# Patient Record
Sex: Male | Born: 1950 | Race: White | Hispanic: No | Marital: Married | Smoking: Former smoker
Health system: Southern US, Community
[De-identification: ages and names within clinical notes are randomized; demographics above are authoritative.]

## PROBLEM LIST (undated history)

## (undated) DIAGNOSIS — Z950 Presence of cardiac pacemaker: Secondary | ICD-10-CM

## (undated) DIAGNOSIS — J449 Chronic obstructive pulmonary disease, unspecified: Secondary | ICD-10-CM

## (undated) DIAGNOSIS — M869 Osteomyelitis, unspecified: Secondary | ICD-10-CM

## (undated) DIAGNOSIS — I509 Heart failure, unspecified: Secondary | ICD-10-CM

## (undated) DIAGNOSIS — I442 Atrioventricular block, complete: Secondary | ICD-10-CM

## (undated) DIAGNOSIS — I5030 Unspecified diastolic (congestive) heart failure: Secondary | ICD-10-CM

## (undated) DIAGNOSIS — I2510 Atherosclerotic heart disease of native coronary artery without angina pectoris: Secondary | ICD-10-CM

## (undated) DIAGNOSIS — I10 Essential (primary) hypertension: Secondary | ICD-10-CM

## (undated) HISTORY — DX: Essential (primary) hypertension: I10

## (undated) HISTORY — PX: CARDIAC CATHETERIZATION: SHX172

## (undated) HISTORY — DX: Osteomyelitis, unspecified: M86.9

## (undated) HISTORY — PX: BELOW KNEE LEG AMPUTATION: SUR23

## (undated) HISTORY — DX: Atherosclerotic heart disease of native coronary artery without angina pectoris: I25.10

## (undated) HISTORY — PX: CORONARY ANGIOPLASTY: SHX604

---

## 2000-01-22 DIAGNOSIS — I2510 Atherosclerotic heart disease of native coronary artery without angina pectoris: Secondary | ICD-10-CM | POA: Insufficient documentation

## 2000-09-19 ENCOUNTER — Inpatient Hospital Stay (HOSPITAL_COMMUNITY): Admission: AD | Admit: 2000-09-19 | Discharge: 2000-09-22 | Payer: Self-pay | Admitting: Cardiology

## 2010-03-08 ENCOUNTER — Encounter (HOSPITAL_BASED_OUTPATIENT_CLINIC_OR_DEPARTMENT_OTHER): Admission: RE | Admit: 2010-03-08 | Discharge: 2010-05-13 | Payer: Self-pay | Admitting: General Surgery

## 2010-03-14 ENCOUNTER — Ambulatory Visit (HOSPITAL_COMMUNITY): Admission: RE | Admit: 2010-03-14 | Discharge: 2010-03-14 | Payer: Self-pay | Admitting: General Surgery

## 2010-03-17 ENCOUNTER — Ambulatory Visit (HOSPITAL_COMMUNITY): Admission: RE | Admit: 2010-03-17 | Discharge: 2010-03-17 | Payer: Self-pay | Admitting: General Surgery

## 2010-03-30 ENCOUNTER — Encounter (HOSPITAL_COMMUNITY): Admission: RE | Admit: 2010-03-30 | Discharge: 2010-06-28 | Payer: Self-pay | Admitting: General Surgery

## 2010-03-30 ENCOUNTER — Ambulatory Visit (HOSPITAL_COMMUNITY): Admission: RE | Admit: 2010-03-30 | Discharge: 2010-03-30 | Payer: Self-pay | Admitting: Internal Medicine

## 2010-05-16 ENCOUNTER — Encounter (HOSPITAL_BASED_OUTPATIENT_CLINIC_OR_DEPARTMENT_OTHER): Admission: RE | Admit: 2010-05-16 | Discharge: 2010-05-31 | Payer: Self-pay | Admitting: General Surgery

## 2010-06-01 ENCOUNTER — Encounter (HOSPITAL_BASED_OUTPATIENT_CLINIC_OR_DEPARTMENT_OTHER): Admission: RE | Admit: 2010-06-01 | Discharge: 2010-08-30 | Payer: Self-pay | Admitting: Internal Medicine

## 2010-09-01 ENCOUNTER — Encounter (HOSPITAL_BASED_OUTPATIENT_CLINIC_OR_DEPARTMENT_OTHER)
Admission: RE | Admit: 2010-09-01 | Discharge: 2010-11-29 | Payer: Self-pay | Source: Home / Self Care | Attending: Internal Medicine | Admitting: Internal Medicine

## 2010-11-17 ENCOUNTER — Other Ambulatory Visit (HOSPITAL_BASED_OUTPATIENT_CLINIC_OR_DEPARTMENT_OTHER): Payer: Self-pay | Admitting: Internal Medicine

## 2010-12-01 ENCOUNTER — Encounter (HOSPITAL_BASED_OUTPATIENT_CLINIC_OR_DEPARTMENT_OTHER): Payer: BC Managed Care – PPO | Attending: Internal Medicine

## 2010-12-01 DIAGNOSIS — X58XXXA Exposure to other specified factors, initial encounter: Secondary | ICD-10-CM | POA: Insufficient documentation

## 2010-12-01 DIAGNOSIS — S91309A Unspecified open wound, unspecified foot, initial encounter: Secondary | ICD-10-CM | POA: Insufficient documentation

## 2010-12-29 ENCOUNTER — Encounter (HOSPITAL_BASED_OUTPATIENT_CLINIC_OR_DEPARTMENT_OTHER): Payer: BC Managed Care – PPO | Attending: Internal Medicine

## 2010-12-29 DIAGNOSIS — S91309A Unspecified open wound, unspecified foot, initial encounter: Secondary | ICD-10-CM | POA: Insufficient documentation

## 2010-12-29 DIAGNOSIS — X58XXXA Exposure to other specified factors, initial encounter: Secondary | ICD-10-CM | POA: Insufficient documentation

## 2011-01-15 LAB — CREATININE, SERUM
Creatinine, Ser: 1.2 mg/dL (ref 0.4–1.5)
Creatinine, Ser: 1.23 mg/dL (ref 0.4–1.5)
Creatinine, Ser: 0.99 mg/dL (ref 0.4–1.5)
Creatinine, Ser: 1.05 mg/dL (ref 0.4–1.5)
Creatinine, Ser: 1.05 mg/dL (ref 0.4–1.5)
Creatinine, Ser: 1.25 mg/dL (ref 0.4–1.5)
Creatinine, Ser: 1.41 mg/dL (ref 0.4–1.5)
GFR calc Af Amer: >60 mL/min (ref >60)
GFR calc Af Amer: >60 mL/min (ref >60)
GFR calc Af Amer: >60 mL/min (ref >60)
GFR calc Af Amer: >60 mL/min (ref >60)
GFR calc Af Amer: >60 mL/min (ref >60)
GFR calc Af Amer: >60 mL/min (ref >60)
GFR calc Af Amer: >60 mL/min (ref >60)
GFR calc non Af Amer: >60 mL/min (ref >60)
GFR calc non Af Amer: >60 mL/min (ref >60)
GFR calc non Af Amer: 52 mL/min — ABNORMAL LOW (ref >60)
GFR calc non Af Amer: 60 mL/min — ABNORMAL LOW (ref >60)
GFR calc non Af Amer: >60 mL/min (ref >60)
GFR calc non Af Amer: >60 mL/min (ref >60)
GFR calc non Af Amer: >60 mL/min (ref >60)

## 2011-01-15 LAB — TOBRAMYCIN LEVEL, TROUGH
Tobramycin Tr: <0.5 ug/mL — ABNORMAL LOW (ref 0.5–2.0)
Tobramycin Tr: 1.5 ug/mL (ref 0.5–2.0)
Tobramycin Tr: <0.5 ug/mL — ABNORMAL LOW (ref 0.5–2.0)

## 2011-01-15 LAB — TOBRAMYCIN LEVEL, RANDOM
Tobramycin Rm: 0.6 ug/mL
Tobramycin Rm: 0.7 ug/mL

## 2011-01-16 LAB — TOBRAMYCIN LEVEL, RANDOM
Tobramycin Rm: 0.6 ug/mL
Tobramycin Rm: 0.7 ug/mL

## 2011-01-16 LAB — CREATININE, SERUM
Creatinine, Ser: 0.79 mg/dL (ref 0.4–1.5)
Creatinine, Ser: 0.85 mg/dL (ref 0.4–1.5)
Creatinine, Ser: 0.88 mg/dL (ref 0.4–1.5)
Creatinine, Ser: 1 mg/dL (ref 0.4–1.5)
GFR calc Af Amer: >60 mL/min (ref >60)
GFR calc Af Amer: >60 mL/min (ref >60)
GFR calc Af Amer: >60 mL/min (ref >60)
GFR calc Af Amer: >60 mL/min (ref >60)
GFR calc non Af Amer: >60 mL/min (ref >60)
GFR calc non Af Amer: >60 mL/min (ref >60)
GFR calc non Af Amer: >60 mL/min (ref >60)
GFR calc non Af Amer: >60 mL/min (ref >60)

## 2011-02-02 ENCOUNTER — Emergency Department (HOSPITAL_COMMUNITY): Payer: BC Managed Care – PPO

## 2011-02-02 ENCOUNTER — Encounter (HOSPITAL_BASED_OUTPATIENT_CLINIC_OR_DEPARTMENT_OTHER): Payer: BC Managed Care – PPO | Attending: Internal Medicine

## 2011-02-02 ENCOUNTER — Inpatient Hospital Stay (HOSPITAL_COMMUNITY)
Admission: EM | Admit: 2011-02-02 | Discharge: 2011-02-10 | DRG: 225 | Disposition: A | Discharge status: Home Health | Payer: BC Managed Care – PPO | Attending: Internal Medicine | Admitting: Internal Medicine

## 2011-02-02 DIAGNOSIS — L988 Other specified disorders of the skin and subcutaneous tissue: Secondary | ICD-10-CM | POA: Diagnosis present

## 2011-02-02 DIAGNOSIS — L97509 Non-pressure chronic ulcer of other part of unspecified foot with unspecified severity: Secondary | ICD-10-CM | POA: Diagnosis present

## 2011-02-02 DIAGNOSIS — E46 Unspecified protein-calorie malnutrition: Secondary | ICD-10-CM | POA: Diagnosis present

## 2011-02-02 DIAGNOSIS — L02619 Cutaneous abscess of unspecified foot: Secondary | ICD-10-CM | POA: Diagnosis present

## 2011-02-02 DIAGNOSIS — Z880 Allergy status to penicillin: Secondary | ICD-10-CM

## 2011-02-02 DIAGNOSIS — X58XXXA Exposure to other specified factors, initial encounter: Secondary | ICD-10-CM | POA: Insufficient documentation

## 2011-02-02 DIAGNOSIS — S91309A Unspecified open wound, unspecified foot, initial encounter: Secondary | ICD-10-CM | POA: Insufficient documentation

## 2011-02-02 DIAGNOSIS — L03119 Cellulitis of unspecified part of limb: Secondary | ICD-10-CM | POA: Diagnosis present

## 2011-02-02 DIAGNOSIS — I2510 Atherosclerotic heart disease of native coronary artery without angina pectoris: Secondary | ICD-10-CM | POA: Diagnosis present

## 2011-02-02 DIAGNOSIS — M869 Osteomyelitis, unspecified: Principal | ICD-10-CM | POA: Diagnosis present

## 2011-02-02 DIAGNOSIS — D649 Anemia, unspecified: Secondary | ICD-10-CM | POA: Diagnosis present

## 2011-02-02 DIAGNOSIS — G5790 Unspecified mononeuropathy of unspecified lower limb: Secondary | ICD-10-CM | POA: Diagnosis present

## 2011-02-02 DIAGNOSIS — Z7982 Long term (current) use of aspirin: Secondary | ICD-10-CM

## 2011-02-02 DIAGNOSIS — D72829 Elevated white blood cell count, unspecified: Secondary | ICD-10-CM | POA: Diagnosis present

## 2011-02-02 LAB — COMPREHENSIVE METABOLIC PANEL
ALT: 31 U/L (ref 0–53)
AST: 40 U/L — ABNORMAL HIGH (ref 0–37)
Albumin: 1.9 g/dL — ABNORMAL LOW (ref 3.5–5.2)
Alkaline Phosphatase: 79 U/L (ref 39–117)
BUN: 12 mg/dL (ref 6–23)
CO2: 25 mEq/L (ref 19–32)
Calcium: 8.2 mg/dL — ABNORMAL LOW (ref 8.4–10.5)
Chloride: 92 mEq/L — ABNORMAL LOW (ref 96–112)
Creatinine, Ser: 0.88 mg/dL (ref 0.4–1.5)
GFR calc Af Amer: >60 mL/min (ref >60)
GFR calc non Af Amer: >60 mL/min (ref >60)
Glucose, Bld: 104 mg/dL — ABNORMAL HIGH (ref 70–99)
Potassium: 3 mEq/L — ABNORMAL LOW (ref 3.5–5.1)
Sodium: 128 mEq/L — ABNORMAL LOW (ref 135–145)
Total Bilirubin: 0.8 mg/dL (ref 0.3–1.2)
Total Protein: 6.5 g/dL (ref 6.0–8.3)

## 2011-02-02 LAB — DIFFERENTIAL
Basophils Absolute: 0 10*3/uL (ref 0.0–0.1)
Basophils Relative: 0 % (ref 0–1)
Eosinophils Absolute: 0 10*3/uL (ref 0.0–0.7)
Eosinophils Relative: 0 % (ref 0–5)
Lymphocytes Relative: 6 % — ABNORMAL LOW (ref 12–46)
Lymphs Abs: 1.1 10*3/uL (ref 0.7–4.0)
Monocytes Absolute: 1.3 10*3/uL — ABNORMAL HIGH (ref 0.1–1.0)
Monocytes Relative: 7 % (ref 3–12)
Neutro Abs: 15.5 10*3/uL — ABNORMAL HIGH (ref 1.7–7.7)
Neutrophils Relative %: 87 % — ABNORMAL HIGH (ref 43–77)

## 2011-02-02 LAB — CBC
HCT: 37.6 % — ABNORMAL LOW (ref 39.0–52.0)
Hemoglobin: 12.9 g/dL — ABNORMAL LOW (ref 13.0–17.0)
MCH: 30 pg (ref 26.0–34.0)
MCHC: 34.3 g/dL (ref 30.0–36.0)
MCV: 87.4 fL (ref 78.0–100.0)
Platelets: 359 10*3/uL (ref 150–400)
RBC: 4.3 MIL/uL (ref 4.22–5.81)
RDW: 13.5 % (ref 11.5–15.5)
WBC: 17.9 10*3/uL — ABNORMAL HIGH (ref 4.0–10.5)

## 2011-02-03 ENCOUNTER — Inpatient Hospital Stay (HOSPITAL_COMMUNITY): Payer: BC Managed Care – PPO

## 2011-02-03 DIAGNOSIS — L97509 Non-pressure chronic ulcer of other part of unspecified foot with unspecified severity: Secondary | ICD-10-CM

## 2011-02-03 LAB — BASIC METABOLIC PANEL
BUN: 14 mg/dL (ref 6–23)
CO2: 28 mEq/L (ref 19–32)
Calcium: 7.8 mg/dL — ABNORMAL LOW (ref 8.4–10.5)
Chloride: 102 mEq/L (ref 96–112)
Creatinine, Ser: 0.79 mg/dL (ref 0.4–1.5)
GFR calc Af Amer: >60 mL/min (ref >60)
GFR calc non Af Amer: >60 mL/min (ref >60)
Glucose, Bld: 104 mg/dL — ABNORMAL HIGH (ref 70–99)
Potassium: 3.9 mEq/L (ref 3.5–5.1)
Sodium: 135 mEq/L (ref 135–145)

## 2011-02-03 LAB — CBC
HCT: 36.1 % — ABNORMAL LOW (ref 39.0–52.0)
Hemoglobin: 11.9 g/dL — ABNORMAL LOW (ref 13.0–17.0)
MCH: 29.8 pg (ref 26.0–34.0)
MCHC: 33 g/dL (ref 30.0–36.0)
MCV: 90.3 fL (ref 78.0–100.0)
Platelets: 356 10*3/uL (ref 150–400)
RBC: 4 MIL/uL — ABNORMAL LOW (ref 4.22–5.81)
RDW: 13.8 % (ref 11.5–15.5)
WBC: 16.2 10*3/uL — ABNORMAL HIGH (ref 4.0–10.5)

## 2011-02-03 LAB — VANCOMYCIN, TROUGH: Vancomycin Tr: 21.5 ug/mL — ABNORMAL HIGH (ref 10.0–20.0)

## 2011-02-03 LAB — MAGNESIUM: Magnesium: 2.2 mg/dL (ref 1.5–2.5)

## 2011-02-03 LAB — TSH: TSH: 3.209 u[IU]/mL (ref 0.350–4.500)

## 2011-02-03 LAB — SEDIMENTATION RATE: Sed Rate: 64 mm/hr — ABNORMAL HIGH (ref 0–16)

## 2011-02-03 MED ORDER — GADOBENATE DIMEGLUMINE 529 MG/ML IV SOLN
20.0000 mL | Freq: Once | INTRAVENOUS | Status: AC | PRN
  Administered 2011-02-03: 20 mL via INTRAVENOUS

## 2011-02-04 LAB — HEPATIC FUNCTION PANEL
ALT: 21 U/L (ref 0–53)
AST: 24 U/L (ref 0–37)
Albumin: 1.5 g/dL — ABNORMAL LOW (ref 3.5–5.2)
Alkaline Phosphatase: 64 U/L (ref 39–117)
Bilirubin, Direct: 0.3 mg/dL (ref 0.0–0.3)
Indirect Bilirubin: 0.5 mg/dL (ref 0.3–0.9)
Total Bilirubin: 0.8 mg/dL (ref 0.3–1.2)
Total Protein: 5.1 g/dL — ABNORMAL LOW (ref 6.0–8.3)

## 2011-02-04 LAB — C-REACTIVE PROTEIN: CRP: 22.4 mg/dL — ABNORMAL HIGH (ref <0.6)

## 2011-02-04 LAB — CBC
HCT: 35.5 % — ABNORMAL LOW (ref 39.0–52.0)
Hemoglobin: 11.4 g/dL — ABNORMAL LOW (ref 13.0–17.0)
MCH: 28.9 pg (ref 26.0–34.0)
MCHC: 32.1 g/dL (ref 30.0–36.0)
MCV: 89.9 fL (ref 78.0–100.0)
Platelets: 345 10*3/uL (ref 150–400)
RBC: 3.95 MIL/uL — ABNORMAL LOW (ref 4.22–5.81)
RDW: 13.8 % (ref 11.5–15.5)
WBC: 14.8 10*3/uL — ABNORMAL HIGH (ref 4.0–10.5)

## 2011-02-04 LAB — PREALBUMIN: Prealbumin: 1.8 mg/dL — ABNORMAL LOW (ref 17.0–34.0)

## 2011-02-04 LAB — HIV ANTIBODY (ROUTINE TESTING W REFLEX): HIV: NONREACTIVE

## 2011-02-05 DIAGNOSIS — L97509 Non-pressure chronic ulcer of other part of unspecified foot with unspecified severity: Secondary | ICD-10-CM

## 2011-02-05 LAB — VANCOMYCIN, TROUGH: Vancomycin Tr: 19.1 ug/mL (ref 10.0–20.0)

## 2011-02-06 ENCOUNTER — Other Ambulatory Visit: Payer: Self-pay | Admitting: Specialist

## 2011-02-06 LAB — WOUND CULTURE

## 2011-02-06 LAB — BASIC METABOLIC PANEL
BUN: 5 mg/dL — ABNORMAL LOW (ref 6–23)
CO2: 28 mEq/L (ref 19–32)
Calcium: 8 mg/dL — ABNORMAL LOW (ref 8.4–10.5)
Chloride: 101 mEq/L (ref 96–112)
Creatinine, Ser: 0.77 mg/dL (ref 0.4–1.5)
GFR calc Af Amer: >60 mL/min (ref >60)
GFR calc non Af Amer: >60 mL/min (ref >60)
Glucose, Bld: 90 mg/dL (ref 70–99)
Potassium: 3.8 mEq/L (ref 3.5–5.1)
Sodium: 134 mEq/L — ABNORMAL LOW (ref 135–145)

## 2011-02-06 LAB — CBC
HCT: 36.2 % — ABNORMAL LOW (ref 39.0–52.0)
Hemoglobin: 11.5 g/dL — ABNORMAL LOW (ref 13.0–17.0)
MCH: 28.8 pg (ref 26.0–34.0)
MCHC: 31.8 g/dL (ref 30.0–36.0)
MCV: 90.5 fL (ref 78.0–100.0)
Platelets: 277 10*3/uL (ref 150–400)
RBC: 4 MIL/uL — ABNORMAL LOW (ref 4.22–5.81)
RDW: 13.8 % (ref 11.5–15.5)
WBC: 11.7 10*3/uL — ABNORMAL HIGH (ref 4.0–10.5)

## 2011-02-07 ENCOUNTER — Inpatient Hospital Stay (HOSPITAL_COMMUNITY): Payer: BC Managed Care – PPO

## 2011-02-07 DIAGNOSIS — L97409 Non-pressure chronic ulcer of unspecified heel and midfoot with unspecified severity: Secondary | ICD-10-CM

## 2011-02-07 LAB — CBC
HCT: 35.8 % — ABNORMAL LOW (ref 39.0–52.0)
Hemoglobin: 11.1 g/dL — ABNORMAL LOW (ref 13.0–17.0)
MCH: 28.5 pg (ref 26.0–34.0)
MCHC: 31 g/dL (ref 30.0–36.0)
MCV: 92 fL (ref 78.0–100.0)
Platelets: 177 10*3/uL (ref 150–400)
RBC: 3.89 MIL/uL — ABNORMAL LOW (ref 4.22–5.81)
RDW: 14.2 % (ref 11.5–15.5)
WBC: 9.4 10*3/uL (ref 4.0–10.5)

## 2011-02-08 DIAGNOSIS — L97909 Non-pressure chronic ulcer of unspecified part of unspecified lower leg with unspecified severity: Secondary | ICD-10-CM

## 2011-02-08 LAB — BASIC METABOLIC PANEL
BUN: 6 mg/dL (ref 6–23)
CO2: 31 mEq/L (ref 19–32)
Calcium: 8.1 mg/dL — ABNORMAL LOW (ref 8.4–10.5)
Chloride: 100 mEq/L (ref 96–112)
Creatinine, Ser: 0.8 mg/dL (ref 0.4–1.5)
GFR calc Af Amer: >60 mL/min (ref >60)
GFR calc non Af Amer: >60 mL/min (ref >60)
Glucose, Bld: 119 mg/dL — ABNORMAL HIGH (ref 70–99)
Potassium: 3.9 mEq/L (ref 3.5–5.1)
Sodium: 136 mEq/L (ref 135–145)

## 2011-02-09 ENCOUNTER — Inpatient Hospital Stay (HOSPITAL_COMMUNITY): Payer: BC Managed Care – PPO

## 2011-02-09 LAB — CULTURE, BLOOD (ROUTINE X 2)
Culture  Setup Time: 201204060026
Culture  Setup Time: 201204060026
Culture: NO GROWTH
Culture: NO GROWTH

## 2011-02-11 NOTE — Discharge Summary (Signed)
NAME:  Nathan Miller, Nathan Miller NO.:  [id number]  MEDICAL RECORD NO.:  [id number]           PATIENT TYPE:  I  LOCATION:  1607                         FACILITY:  [facility]  PHYSICIAN:  Hillery Aldo, M.D.   DATE OF BIRTH:  09/16/1951  DATE OF ADMISSION:  02/02/2011 DATE OF DISCHARGE:  02/09/2011                              DISCHARGE SUMMARY   PRIMARY CARE PHYSICIAN:  Maxwell Caul, M.D.  ORTHOPEDIC SURGEON:  Jene Every, M.D.  INFECTIOUS DISEASE PHYSICIAN:  Lacretia Leigh. Ninetta Lights, M.D.  DISCHARGE DIAGNOSES: 1. Right foot osteomyelitis status post debridement and resection of     the fourth and fifth metatarsals and partial resection of the third     metatarsal with partial resection of the cuboid and application of     a wound VAC. 2. 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 3. History of coronary artery disease. 4. PENICILLIN allergy. 5. Right non-diabetic neuropathic foot. 6. Mild normocytic anemia  DISCHARGE MEDICATIONS: 1. Aspirin 81 mg p.o. daily. 2. Ensure 1 can t.i.d. 3. Merrem 500 mg IV q.8 h via PICC line x5 weeks. 4. Xanax 0.5 mg p.o. b.i.d.  CONSULTATIONS: 1. Dr. Jene Every with [facility]. 2. Dr. Johny Sax with Infectious Diseases.  BRIEF ADMISSION HPI:  The patient is a 60 year old male with a past medical history of chronic nonhealing wound infection involving his right lower extremity.  On the date of admission, the patient saw his physician and was subsequently sent to the hospital due to increased swelling and erythema of his right lower extremity.  The patient subsequently was referred to the hospitalist service for inpatient management with IV antibiotics.  For full details, please see the dictated report done by Dr. Ardyth Harps.  PROCEDURES AND DIAGNOSTIC STUDIES: 1. MRI of the right foot on February 03, 2011, showed florid osteomyelitis     of the foot with medial plantar and medial midfoot soft  tissue     abscesses, septic midfoot joints, ulceration with packing material     extending to the lateral tarsometatarsal junction, severe     inflammatory changes of the ankle likely reflecting neuropathic     ankle, however, septic joint could not be excluded. 2. Bedside debridement of necrotic tissue performed on February 03, 2011. 3. Resection of the fourth and fifth metatarsals, partial resection of     third metatarsal with partial resection of the cuboid, incision and     debridement and application of wound VAC performed on February 06, 2011. 4. Chest x-ray on February 09, 2011, status post placement of a right     peripherally inserted central catheter, showed the catheter line     tip in the lower SVC.  DISCHARGE LABORATORY VALUES:  Blood cultures have remained negative. Sodium was 136, potassium 3.9, chloride 100, bicarb 31, BUN 9, creatinine 0.80, glucose 119, calcium 8.1.  White blood cell count was 9.4, hemoglobin 11.1, hematocrit 35.8, platelets 177.  Wound cultures grew multiple organisms with none predominant.  HIV antibody was nonreactive.  CRP was 22.4.  ESR was 64.  TSH was 3.209.  HOSPITAL COURSE BY PROBLEM: 1. Right foot osteomyelitis secondary to nonhealing ulcer and     surrounding cellulitis:  The patient was admitted to the hospital     and put on broad spectrum antibiotics.  Wound cultures and blood     cultures were obtained.  Both Orthopedics and Infectious Disease     services were consulted and a bedside debridement was initially     performed.  The MRI confirmed florid osteomyelitis and the patient     ultimately went to surgery as described above.  Infectious Disease     recommended treatment with Primaxin due to his penicillin allergy.     Cultures ultimately showed polymicrobial growth and recommendations     from Dr. Ninetta Lights were for a full 6-week course of IV antibiotics     which can be changed to meropenem for ease of administration at     discharge.   The patient will be discharged on meropenem 500 mg IV     q.8 h via a PICC line that was inserted for this purpose.  A wound     VAC was also placed and will continue to be monitored by the home     health nurses for wound healing.  The patient should follow up with     the orthopedic surgeon as directed. 2. History of coronary artery disease:  The patient was put on aspirin     therapy for his known history of coronary artery disease.  He was     not on this prior to admission but was recommended to continue     taking this at discharge.  He has no complaints of chest pain. 3. Nondiabetic neuropathic right foot.  This was secondary to a     history of remote trauma.  He will need to have close followup. 4. Mild normocytic anemia:  The patient does have a mild normocytic     anemia and we do recommend further outpatient evaluation by the     patient's primary care physician if deemed necessary.  He has not     had a GI evaluation, could consider this as an outpatient.  DISPOSITION:  The patient is medically stable and will be discharged home on IV antibiotic therapy times additional 5 weeks.  ACTIVITY:  As tolerated.  DIET:  Heart healthy  CONDITION ON DISCHARGE:  Stable.  Time spent coordinating care for discharge and discharge instructions including face-to-face time equals approximately 35 minutes.     Hillery Aldo, M.D.     CR/MEDQ  D:  02/09/2011  T:  02/09/2011  Job:  [id number]  cc:   Maxwell Caul, M.D.  Jene Every, M.D. Fax: [phone]  Lacretia Leigh. Ninetta Lights, M.D. Fax: [phone]  Electronically Signed by Hillery Aldo M.D. on 02/11/2011 07:15:48 AM

## 2011-02-15 NOTE — Op Note (Signed)
NAME:  Nathan Miller, Nathan Miller NO.:  [id number]  MEDICAL RECORD NO.:  [id number]           PATIENT TYPE:  LOCATION:                                 FACILITY:  PHYSICIAN:  Jene Every, M.D.    DATE OF BIRTH:  Jan 26, 1951  DATE OF PROCEDURE: DATE OF DISCHARGE:                              OPERATIVE REPORT   PREOPERATIVE DIAGNOSIS:  Necrotic nonhealing neuropathic ulcer over the right foot with osteomyelitis of the fifth metatarsal.  POSTOPERATIVE DIAGNOSIS:  Necrotic nonhealing neuropathic ulcer over the right foot with osteomyelitis of the fifth metatarsal.  PROCEDURE PERFORMED: 1. Irrigation and debridement of nonhealing ulcer. 2. Partial excision of the fifth metatarsal base. 3. I and D of wound including bone with the wound measuring 10 cm x 6     cm.  ANESTHESIA:  None.  The patient had neuropathic foot.  BRIEF HISTORY:  A 60 year old with nonhealing ulcer, neuropathic, admitted for infection and cellulitis.  He sustained trauma in the past, there was an open wound with foul-smelling odor.  He was admitted on the medicine service.  Consult for ID is indicated for debridement, did at the bedside as the patient had just eaten food.  TECHNIQUE:  The patient was in supine position.  The right foot was prepped and draped in the usual sterile fashion.  I sharply debrided the wound edges, which were necrotic, to good bleeding tissue.  I debrided deep down to the fifth metatarsal, which was totally exposed.  It appeared to be nonviable, removed a portion of the fifth metatarsal base with a Kelly and spread just beneath the fifth metatarsal and the deep compartments of the superficial compartments as well as proximally to just beneath the fibula and distally just to rule out any subcutaneous abscess.  There was no gross purulence there, though there was some tissue that I did culture for aerobic, anaerobic, and Gram stain.  I debride the wound edges.  I then  packed the wound open and secured it without compression dressing.  The patient tolerated procedure well.  No complication.  Blood loss was 10 cc.  The patient will require a formal further revision in the operating room to resect the majority of the fifth metatarsal and then consider with further debridement and then consider definitive reconstructive procedure.     Jene Every, M.D.     Cordelia Pen  D:  02/03/2011  T:  02/04/2011  Job:  [id number]  Electronically Signed by Jene Every M.D. on 02/15/2011 12:12:16 PM

## 2011-02-15 NOTE — Consult Note (Signed)
NAME:  Nathan Miller, Nathan Miller NO.:  [id number]  MEDICAL RECORD NO.:  [id number]           PATIENT TYPE:  I  LOCATION:  1607                         FACILITY:  [facility]  PHYSICIAN:  Jene Every, M.D.    DATE OF BIRTH:  1950-11-16  DATE OF CONSULTATION:  02/03/2011 DATE OF DISCHARGE:                                CONSULTATION   CHIEF COMPLAINT:  Nonhealing necrotic wound, right foot.  HISTORY:  This is a 60 year old male who has had chronic nonhealing ulcer of the right foot, treated by [facility] Leanord [facility]. It has been multiple years of duration, originally stemming from a fracture sustained in his leg.  He was recently seen by Dr. Leanord Hawking and noted to have increased swelling and redness of his leg, sent through the emergency room, given IV antibiotics, and was admitted on February 02, 2011.  Given the possibility of osteomyelitis of the foot, needs MRI.  The patient reported recent fever but no chills, unexplained recentweight loss, and continues to work at a [employer].  PAST MEDICAL HISTORY:  Coronary artery disease, status post stent.  ALLERGIES:  PENICILLIN - hives and shortness of breath.  MEDICATIONS:  Xanax and currently on IV antibiotics.  FAMILY MEDICAL HISTORY:  Hypertension.  SOCIAL HISTORY:  Two to three beers per day, tobacco 2 to 3 cigarettes per day.  Lives on his own.  PHYSICAL EXAMINATION:  GENERAL:  Healthy, no distress, mood and affect appropriate. VITAL SIGNS:  Currently, afebrile, pulse 75, blood pressure is normal. HEENT:  Within normal limits. COR:  Regular rate and rhythm. LUNGS:  Clear to auscultation. ABDOMEN:  Soft, nontender. PELVIS:  Stable. EXTREMITIES:  Right leg, he does have some cellulitis up into the mid calf.  Knee exam is unremarkable as is the thigh.  His compartments are soft.  There is no fluctuance.  Removed his dressing, he has a large necrotic wound extending from the distal lateral aspect of the 5th  toe to the proximal 5th toe.  It is necrotic, foul smelling, appears to be dry.  There is no purulence from the wound itself but necrotic bone is seen.  Trace dorsalis pedis pulse is noted.  Contralateral leg is within normal limits.  LABORATORY DATA:  Current labs reveal white count of 15.2 which has decreased from 17.9, hemoglobin 11.9.  Creatinine is 0.79.  He does have a low albumin at 1.9 on admit.  Radiographs of the foot demonstrate soft tissue swelling in the base of the 5th metatarsal and pain suggesting possible osteomyelitis.  There are severe degenerative changes of multiple tarsal bones suggesting neuropathic arthropathy.  Cortical erosion of the base of fifth metatarsal.  He had sensation of plantar aspect of the foot which is chronic.  Culture from the wound have grown pseudomonas in the past, MRI indicating possible osteo, no abscess.  He is on vancomycin.  IMPRESSION:  Chronic necrotic nonhealing ulcer of the right foot with nonviable 5th metatarsal chronic osteomyelitis with associated pseudomonas organism, neuropathic foot.  PLAN:  The patient will receive local debridement at the bedside, he has just  eaten.  He is not septic, he will require most likely a revision amputation of the foot, will discuss this with perhaps with Dr. Lestine Box for further definitive treatment.  This, however, will debride the necrotic wound at the bedside, and then consult wound management then local debridement, ID consult, and appropriate antibiotics.     Jene Every, M.D.     Cordelia Pen  D:  02/03/2011  T:  02/04/2011  Job:  [id number]  Electronically Signed by Jene Every M.D. on 02/15/2011 12:12:10 PM

## 2011-02-15 NOTE — Op Note (Signed)
NAME:  REIF, BEDGOOD NO.:  [id number]  MEDICAL RECORD NO.:  [id number]           PATIENT TYPE:  I  LOCATION:  1607                         [facility]ACILITY:  [facility]  PHYSICIAN:  Jene Every, M.D.    DATE OF BIRTH:  1951-06-13  DATE OF PROCEDURE: DATE OF DISCHARGE:                              OPERATIVE REPORT   PREOPERATIVE DIAGNOSIS:  Osteomyelitis of the right foot, neuropathic ulcer.  POSTOPERATIVE DIAGNOSIS:  Osteomyelitis of the right foot, neuropathic ulcer.  PROCEDURES: 1. Ray resection of the fourth and fifth metatarsals. 2. Partial resection of the third metatarsal. 3. Partial resection of the cuboid. 4. Incision and debridement. 5. Application of wound VAC.  ANESTHESIA:  Spinal.  ASSISTANT:  None.  BRIEF HISTORY:  This is a 60 year old who has had history of a neuropathic foot from a nerve damage from an accident, developed an ulcer, was treated over the past year, deteriorated, got infected, exposed bone, seen in the emergency room, cellulitis and sepsis, was treated with IV antibiotics, seen by Infectious Disease.  They controlled the cellulitis, got the wound culture.  We did a provisional debridement at the bedside.  He was indicated for a formal debridement and removal of nonviable tissue.  He did have a fifth metatarsal exposing the bone that was dark and nonviable.  He was indicated for resection of devitalized bone which was infected, debridement, possible application of wound VAC.  The risks and benefits were discussed including bleeding, infection, need for further revision, need for proximal amputation, DVT, PE, anesthetic complications etc.  TECHNIQUE:  The patient in supine position.  After induction of adequate spinal anesthesia, the right lower extremity was prepped and draped in the usual sterile fashion.  First we debrided the skin edges of the large foot ulcer which expanded from the digits distally to near the calcaneus  proximally.  It extended from the dorsum of the foot to the plantar aspect of the foot.  The fifth metatarsal was engaged and had dislodged quite easily, the whole bone appeared to be nonviable.  I then debrided the metatarsophalangeal joint, debrided the capsule of that joint to good bleeding tissue.  The base surrounding the fifth was debrided as well, the  four was identified and two appeared to be nonviable and the fourth metatarsal was disarticulated, again without any difficulty and indicated nonviable tissue without attachment.  It exposed base of the third which was partially excised to bleeding tissue.  Cuboid was also noted and it was devitalized and we debrided to perform a partial cuboidectomy to bleeding tissue.  The wound edges surrounding this were debrided to bone.  Cautery was utilized to achieve hemostasis.  He actually had a good blood supply to the wound.  No pus was noted but necrotic tissue was excised.  After all necrotic tissue was excised to good bleeding bed and good bleeding tissue, had a fairly large cavity measuring 10 cm x 7 cm.  This was deep to the wound.  We felt this would require the VAC  and then we copiously irrigated with pulsatile lavage.  I debrided the joint  capsule and removed the soft tissue from the metatarsophalangeal joint of the fourth and fifth.  We debrided all edges to good clean tissue.  I selected a VAC sponge that was the size of the wound and delivered it into the wound and dried the foot and secured the [facility] and hooked it up with continued dissection with excellent suction applied and securing of the wound.  The other part of the heel, there was an abrasion with some bleeding tissue or bleeding ulcer.  We placed Xeroform on that and then covered it to the wound VAC and hooked it up to the suction.  The wound was then dressed sterilely and the patient was then transported to the recovery room in satisfactory condition.  The patient  tolerated the procedure well.  No complication.  Blood loss was 100 cc.     Jene Every, M.D.     Cordelia Pen  D:  02/06/2011  T:  02/07/2011  Job:  [id number]  Electronically Signed by Jene Every M.D. on 02/15/2011 12:12:14 PM

## 2011-02-16 NOTE — Group Therapy Note (Signed)
NAME:  Nathan Miller, Nathan Miller NO.:  [id number]  MEDICAL RECORD NO.:  [id number]           PATIENT TYPE:  I  LOCATION:  1607                         FACILITY:  [facility]  PHYSICIAN:  Clydia Llano, MD       DATE OF BIRTH:  Mar 03, 1951                                PROGRESS NOTE   REASON FOR ADMISSION: Right foot wound.  DISCHARGE DIAGNOSES: 1. Right foot acute osteomyelitis. 2. History of chronic nonhealing ulcer infected with Pseudomonas. 3. History of coronary artery disease. 4. Penicillin allergy. 5. Right nondiabetic neuropathic foot.  DISCHARGE MEDICATIONS: To be done at the time of actual discharge.  CONSULTS: 1. [facility], Dr. Jene Every. 2. Infectious Disease service, Dr. Johny Sax.  PROCEDURES: 1. April 6 bedside debridement of necrotic tissues. 2. April 9 ray resection of the fourth and fifth metatarsals, partial     resection of the third metatarsal with partial resection of the     cuboid, with incision and debridement and application of wound VAC.  BRIEF HISTORY AND EXAMINATION: Mr. Lopes is a 60 year old Caucasian male with a past medical history of right nondiabetic neuropathic foot and chronic, nonhealing right lateral foot ulcer.  The patient was following with [facility], went to Dr. Leanord Hawking because of increased swelling and redness on his right leg.  The patient was promptly sent to the emergency department for evaluation of IV antibiotics given that his wound was significantly worsened since last time of evaluation.  The redness extends up to the knee.  Mr. Matkin has really no active complaints in terms of fever or chills.  He has really no active complaints apart from fever every now and then.  He said sometimes at home he ran a temperature up to 102.  RADIOLOGY: 1. MRI of the right foot April 6 showed florid osteomyelitis of the     foot with medial plantar and medial midfoot soft tissue abscesses,     septic midfoot  joints, ulceration with packing material extending     to the lateral tarsometatarsal junction.  Severe inflammatory     changes of the ankle probably reflect neuropathic ankle, however,     septic joints cannot be excluded. 2. Foot x-ray April 10 showed postsurgical changes, as described, with     resection of the fifth metatarsal and most of the fourth     metatarsal.  There is partial resection or progressive destruction     of the base of the third metatarsal with lots of cuneiform and the     cuboid.  BRIEF HOSPITAL COURSE: 1. Right foot acute osteomyelitis/cellulitis.  As mentioned above, the     patient was following with [facility]ound Clinic and he was sent because of     worsening of the wound.  The patient's wound, when I examined it,     had a greenish tint.  It had the worst smell I have smelled in     years.  I consulted Orthopedics and Infectious Disease service.     The patient had two debridements until now, one at bedside and the  other one was major with bone resection on the 9th.  The patient is     still on Maxipime.  The patient is still on Primaxin 500 mg every 6     hours.  The culture was polymicrobial and it was awaiting further     lab for further identification of the organisms.  The patient has     wound VAC now.  The Ortho is not planning for further debridement. 2. History of coronary artery disease, stable:  The patient was not on     chronic medication for that except aspirin.  He has no complaints. 3. Nondiabetic neuropathic right foot:  This is secondary to a remote     trauma.  The patient has a neuropathy and severe right foot joints     changes secondary to that.  DISCHARGE INSTRUCTIONS: 1. Activity as tolerated. 2. Diet:  Regular. 3. Disposition:  Home with home health service for wound VAC. 4. Please verify with Infectious Diseases the final recommendation     with antibiotics.  If the patient needs to be on IV antibiotics,     the patient should  get PICC line.     Clydia Llano, MD     ME/MEDQ  D:  02/08/2011  T:  02/08/2011  Job:  [id number]  Electronically Signed by Clydia Llano  on 02/16/2011 05:10:34 PM

## 2011-02-24 NOTE — Assessment & Plan Note (Signed)
[facility]  NAME:  Nathan Miller, Nathan Miller NO.:  [id number]  MEDICAL RECORD NO.:  [id number]      DATE OF BIRTH:  03/07/1951  PHYSICIAN:  Maxwell Caul, M.D. VISIT DATE:  02/23/2011                                  OFFICE VISIT   HISTORY OF PRESENT ILLNESS:  Nathan Miller is a gentleman I have followed for a neuropathic wound on his right foot dating back to the early parts of 2011.  He initially was diagnosed with osteomyelitis of the right fifth metatarsal.  He underwent a prolonged course of antibiotics with culture showing Pseudomonas as well as HBO.  Although the wound improved through the summer of 2011, this never really totally resolved or even got close to resolving.  The patient needed to return to work.  He was noncompliant with weightbearing and was not interested in surgical options including referrals to foot surgeons.  He came into the clinic on February 02, 2011 with a grossly infected wound on the foot with cellulitis expanding up his leg.  He was admitted to the hospital.  An MRI showed osteomyelitis involving the midfoot and metatarsal bases, the cuboid, the lateral middle and medial cuneiforms as well as the navicular bone.  There was soft tissue infection in the mid foot as well.  He was seen by Infectious Diseases.  His cultures, both blood and wound were negative.  He was ended up on meropenem 500 IV q.8 h. for a further 5 weeks (total of 6 weeks).  He is at home still up on this somewhat (went to the grocery store yesterday), however, his daughter accompanies him said he is most spending most of his time sitting in a chair with his leg propped up.  He has not had any fever or chills.  He is eating and drinking well.  PHYSICAL EXAMINATION:  VITAL SIGNS:  His temperature is 98.2, pulse 91, respirations 18, and blood pressure 122/76. SKIN:  He now has a large wound which is a surgical wound.  There is healthy granulation.  There  is an exposed bone in the middle of this.  I did do a culture of the wound base.  The foot itself is very swollen, somewhat pink in color, not grossly infected, and he is neuropathic here, so he is not experiencing any pain.  IMPRESSION: 1. Now postsurgical wound for a septic foot and multiple areas of     osteomyelitis.  He is on meropenem for this.  He is following with     Dr. Shelle Iron.  The wound itself does not look unhealthy.  I did     reculture this.  The foot itself is very swollen, however, he does     not appear to be grossly infected at this point.  He will continue the antibiotics as directed by Dr. Ninetta Lights.  We will see if our current culture shows anything different.  His compliance with staying off this I think is marginal.  I have asked [facility] who is the [facility] seeing him to train him on the use of crutches to give him a little mobility and staying off this foot.  He would be a candidate for hyperbaric oxygen.  However, logistically transporting  him here seems a bit overwhelming, although I will explore this with him and his family next week.  With regards to actual wound dressings, we have added Prisma under the wound VAC and this will continued to be changed by [facility].          ______________________________ Maxwell Caul, M.D.     MGR/MEDQ  D:  02/23/2011  T:  02/24/2011  Job:  [id number]

## 2011-02-26 ENCOUNTER — Emergency Department (HOSPITAL_COMMUNITY)
Admission: EM | Admit: 2011-02-26 | Discharge: 2011-02-26 | Disposition: A | Discharge status: Home / Self Care | Payer: BC Managed Care – PPO | Attending: Emergency Medicine | Admitting: Emergency Medicine

## 2011-02-26 ENCOUNTER — Emergency Department (HOSPITAL_COMMUNITY): Payer: BC Managed Care – PPO

## 2011-02-26 DIAGNOSIS — Y840 Cardiac catheterization as the cause of abnormal reaction of the patient, or of later complication, without mention of misadventure at the time of the procedure: Secondary | ICD-10-CM | POA: Insufficient documentation

## 2011-02-26 DIAGNOSIS — Z139 Encounter for screening, unspecified: Secondary | ICD-10-CM | POA: Insufficient documentation

## 2011-02-26 DIAGNOSIS — I252 Old myocardial infarction: Secondary | ICD-10-CM | POA: Insufficient documentation

## 2011-02-26 DIAGNOSIS — F17200 Nicotine dependence, unspecified, uncomplicated: Secondary | ICD-10-CM | POA: Insufficient documentation

## 2011-02-26 DIAGNOSIS — I10 Essential (primary) hypertension: Secondary | ICD-10-CM | POA: Insufficient documentation

## 2011-02-26 DIAGNOSIS — S98139A Complete traumatic amputation of one unspecified lesser toe, initial encounter: Secondary | ICD-10-CM | POA: Insufficient documentation

## 2011-02-26 DIAGNOSIS — T82897A Other specified complication of cardiac prosthetic devices, implants and grafts, initial encounter: Secondary | ICD-10-CM | POA: Insufficient documentation

## 2011-02-26 NOTE — H&P (Signed)
NAME:  Nathan Miller, Nathan Miller NO.:  [id number]  MEDICAL RECORD NO.:  [id number]           PATIENT TYPE:  E  LOCATION:  WLED                         FACILITY:  [facility]  PHYSICIAN:  Peggye Pitt, M.D. DATE OF BIRTH:  08/16/1951  DATE OF ADMISSION:  02/02/2011 DATE OF DISCHARGE:                             HISTORY & PHYSICAL   PRIMARY CARE PHYSICIAN:  Maxwell Caul, MD  CHIEF COMPLAINT:  Right foot wound.  HISTORY OF PRESENT ILLNESS:  Nathan Miller is a 60 year old Caucasian gentleman with past medical history significant for coronary artery disease status post RCA stent in 2001 and also a chronic nonhealing right lateral foot ulcer, who went to see his physician today, Dr. Leanord Hawking, because of increased swelling and redness of his right leg.  Dr. Leanord Hawking promptly send him to the emergency department for evaluation and IV antibiotics given the fact that his wound has significantly worsened as well as he now has some redness extending up to the knee.  Nathan Miller has really no active complaints at this time other than he states he has been running a temperature at home, overall 102.  ALLERGIES:  He has drug allergies to PENICILLIN which cause hives and shortness of breath.  PAST MEDICAL HISTORY:  Significant only for coronary artery disease status post RCA stent in 2001.  HOME MEDICATIONS:  Only include Xanax 0.5 mg twice daily.  SOCIAL HISTORY:  He drinks about 2-3 beers a day.  Smokes about 2-3 cigarettes a day.  Denies any illicit drug use.  Lives on his own.  His friend, Sharia Reeve, is present at time of my exam.  FAMILY HISTORY:  Significant for high blood pressure in both parents, but otherwise insignificant.  REVIEW OF SYSTEMS:  Negative except as mentioned in history of present illness.  PHYSICAL EXAMINATION:  VITALS:  On admission; blood pressure 106/54, heart rate 66, respirations 20, sats of 98% on room air, and temp of 102.3. GENERAL:  He is alert, awake,  and oriented x3.  He appears unkempt and dirty, but is in no acute distress. HEENT:  Normocephalic and atraumatic.  His pupils are equally round and reactive to light.  He has intact extraocular movements. NECK:  Supple.  No JVD.  No lymphadenopathy.  No bruits.  No goiter. HEART:  Regular rate and rhythm.  I cannot auscultate any murmurs, rubs, or gallops. LUNGS:  Clear to auscultation bilaterally. ABDOMEN:  Soft, nontender, and nondistended.  Positive bowel sounds. EXTREMITIES:  His left lower extremity has no clubbing, cyanosis, or edema.  He has onychomycosis of both big toenails.  On his right, he has a very large open wound to the lateral aspect of his foot, there is a lot of pus and dead tissue and I can see the bone through his wound.  He has a very foul older.  He has erythema extending all the way up to his knee, was about 3+ edema up to about mid thigh area. NEUROLOGIC:  Grossly intact and nonfocal.  LABORATORY DATA ON ADMISSION:  Sodium 128, potassium 3.0, chloride 92, bicarb 25, BUN 12, creatinine 0.88, and  glucose of 104.  All of his LFTs are within normal limits with the exception of a very low albumin of 1.9.  WBC is 17.9 with an ANC of 15.5, hemoglobin of 12.9, and platelet count of 359.  A foot x-ray that shows extensive soft tissue infection likely involving a large amount of soft tissue throughout the foot. There is air extending to the base of the fifth metatarsal with radiographic findings suspicious for osteomyelitis.  ASSESSMENT AND PLAN: 1. For his right foot ulcers/cellulitis/likely osteomyelitis.  Upon     review of Dr. Jannetta Quint prior wound care note, it appears that in     May 2011, he had cultures that grew Pseudomonas.  The odor from his     foot that would certainly be consistent with this.  At that time,     the Pseudomonas was resistant to quinolones and ceftriaxone.  Given     his severe PENICILLIN allergy, I will start him on IV vancomycin as      well as aztreonam.  I will check ABIs to evaluate the circulation     to his legs.  However, with the extent of infection and likely     osteomyelitis, I suspect that amputation of the right foot may be     in his near future.  I would encourage consulting Orthopedics in     the morning. 2. For his hyponatremia, he has a sodium level of 128 on admission.     Per his report, he has not been eating very well and I suspect that     this may be the cause.  I will give him some IV fluids and see if     this resolves. 3. Hypokalemia.  I will replete this orally and check a magnesium     level. 4. Protein-caloric malnutrition with an albumin of 1.9.  I will start     him on Ensure t.i.d.  I will also get nutrition to evaluate him. 5. For his history of coronary artery disease, at the moment, he is     stable, he has no chest pain.  I do not see any echocardiograms in     the computer, however, he states that he had a 2-D echo performed     last year at the [facility] prior to him going to the     hyperbaric chamber for his foot and at that time he was told that     it "looked okay." 6. For deep vein thrombosis prophylaxis, I will place him on Lovenox.     Peggye Pitt, M.D.     EH/MEDQ  D:  02/02/2011  T:  02/02/2011  Job:  [id number]  cc:   Maxwell Caul, M.D.  Electronically Signed by Peggye Pitt M.D. on 02/26/2011 09:47:58 AM

## 2011-03-02 ENCOUNTER — Encounter (HOSPITAL_BASED_OUTPATIENT_CLINIC_OR_DEPARTMENT_OTHER): Payer: BC Managed Care – PPO | Attending: Internal Medicine

## 2011-03-02 DIAGNOSIS — M869 Osteomyelitis, unspecified: Secondary | ICD-10-CM | POA: Insufficient documentation

## 2011-03-03 ENCOUNTER — Ambulatory Visit: Payer: BC Managed Care – PPO | Admitting: Infectious Diseases

## 2011-03-17 ENCOUNTER — Encounter: Payer: Self-pay | Admitting: Infectious Diseases

## 2011-03-17 DIAGNOSIS — M869 Osteomyelitis, unspecified: Secondary | ICD-10-CM | POA: Insufficient documentation

## 2011-03-17 DIAGNOSIS — M86271 Subacute osteomyelitis, right ankle and foot: Secondary | ICD-10-CM | POA: Insufficient documentation

## 2011-03-17 NOTE — Cardiovascular Report (Signed)
[place]. [facility]  Patient:    Nathan Miller, Nathan Miller                         MRN: [id number] Proc. Date: 09/19/00 Adm. Date:  [id number] Attending:  Nelta Numbers CC:         Colon Flattery, D.O.  Gerrit Friends. Dietrich Pates, M.D. [facility]  Cardiac Catheterization Laboratory   Cardiac Catheterization  PROCEDURES PERFORMED: 1. Left heart catheterization with coronary angiography and left    ventriculography. 2. Insertion of temporary transvenous pacemaker. 3. Percutaneous transluminal coronary angioplasty with stent placement    x 2 in the mid right coronary artery.  INDICATIONS:  Mr. Mcneill is a 60 year old male who presented to Dr. Garnette Gunner office this morning with several hours of ongoing substernal chest pain. Initial ECG showed inferior T wave inversions without obvious ST segment elevations.  He was transferred to [facility] where on arrival he was having ongoing chest pain.  Cardiac enzymes were elevated consistent with an acute myocardial infarction.  The patient was therefore brought emergently to the cardiac catheterization laboratory.  CATHETERIZATION PROCEDURE:  A 6 French sheath was placed in the right femoral artery.  Standard Judkins 6 French catheters were utilized.  Contrast was Omnipaque.  There were no complications.  RESULTS:  HEMODYNAMICS:  Left ventricular pressure 120/22.  Aortic pressure 118/75. There was no aortic valve gradient.  LEFT VENTRICULOGRAM:  There is mild akinesis of the inferior basal wall. Otherwise wall motion is normal.  Ejection fraction is calculated at 61%. Trace mitral regurgitation.  CORONARY ARTERIOGRAPHY:  (Right dominant).  Left main:  Normal.  Left anterior descending:  The left anterior descending artery has a 40% stenosis at its origin.  In the mid vessel is a diffuse 30% stenosis.  The LAD gives rise to a normal sized first diagonal and shows a 20% stenosis.  There is a small second and third  diagonal branches.  Left circumflex:  The left circumflex is a very large vessel giving rise to a small to normal sized first marginal, small second marginal, and large third and forth marginal branches.  In the proximal to mid circumflex is a 70% stenosis.  In the distal circumflex is a 20% stenosis.  The first marginal which is small to normal in size has a 30% stenosis.  Right coronary artery:  The right coronary artery is a dominant vessel. However, it descends as a large posterior descending artery and a small posterolateral branch.  The right coronary artery is 100% occluded in the mid vessel.  Proximal to the 100% occlusion is a 60% occlusion in the distal portion of the proximal vessel.  There is TIMI-0 flow into the distal vessel. There are grade 1 left to right collaterals filling the distal vessel. Arising from the site of the occlusion in the mid vessel is a small right ventricular branch with a 99% stenosis and slow flow.  IMPRESSION: 1. Left ventricular systolic function characterized by mild inferobasal    akinesis but overall preserved ejection fraction. 2. Two-vessel coronary artery disease.  The culprit is 100% occlusion of the    mid right coronary artery.  The stenosis in the left circumflex appears to    be of borderline severity and the left anterior descending appears to be    nonobstructive.  PLAN:  Percutaneous intervention of the right coronary artery.  See below.  PERCUTANEOUS TRANSLUMINAL CORONARY ANGIOPLASTY PROCEDURE:  Following completion of the  diagnostic catheterization, we proceeded with coronary intervention.  The 6 French sheath in the right femoral artery was exchanged over a wire for a 7 [place] sheath.  We placed a 6 French sheath in the right femoral vein.  The patient had been started on Integrilin prior to coming to the catheterization laboratory and this was continued at its current rate. Heparin was administered to achieve an ACT of greater  than 200 seconds.  We used a 7 [place] guiding catheter with side holes.  The lesion was successfully crossed with a Hi-Torque Floppy wire which was advanced into the distal vessel.  We then performed balloon dilatation of the vessel at the site of the occlusion with a 3.0 x 15 mm Maverick balloon initially to 6 atmospheres.  This did establish reperfusion in the distal vessel.  However, there is a significant residual stenosis with thrombus.  We performed two subsequent inflations with a 3.0 x 15 Maverick balloon, each to 8 atmospheres. We then deployed a 3.5 x 28 mm Penta stent across the lesion and a deployment pressure of 9 atmospheres.  There is still a residual stenosis just proximal to this stent and we therefore placed a second stent proximal to the first stent with minimal overlap of the two.  This was a 3.5 x 8 mm Penta deployed at 12 atmospheres.  At that point, TIMI-3 flow had been established into the distal vessel.  However, the very distal vessel at the site of a small posterolateral branch was occluded by a thrombus which had embolized from the primary lesion.  We therefore advanced our Hi-Torque Floppy wire past this area of occlusion.  Next, we advanced a 3.5 x 20 mm Quantum Ranger balloon over the wire and attempted to reestablish flow into the posterolateral by doddering the area of occlusion with this balloon.  However, initial attempts did not reestablish perfusion distally.  We used this balloon to postdilate the stents in the mid vessel.  The balloon was inflated to 16 atmospheres in the distal aspect of the stents and 18 atmospheres in the proximal aspects of the stents.  We then readvanced this balloon one more time into the distal vessel, again with attempts to dodder the site of occlusion.  This was successful in reestablishing flow into the posterolateral branch.  Final  angiographic images were obtained revealing patency of the right coronary artery with 0%  residual stenosis at the original site of occlusion and TIMI-3 flow into the distal vessel.  The distal vessel is widely patent.  There was a 25% stenosis distally before the posterior descending artery.  The distal vessel consisted of a normal to large posterior descending artery and a small posterolateral branch.  The small right ventricular branch which arose from the site of occlusion and originally had a 99% stenosis was 100% occluded after placement of the stent.  However, the patient was completely pain-free at that point and hemodynamically stable.  COMPLICATIONS:  None.  RESULTS:  Successful PTCA with stent placement x 2 in the mid right coronary artery reducing a 60% followed by 100% occlusion to 0% residual with TIMI-3 flow.  PLAN:  Integrilin will be continued for an additional 24 hours.  Plavix will be administered for four weeks.  It is recommended that a stress Cardiolite be obtained in four to six weeks to access the significance of disease in the left circumflex system.  If there is lateral ischemia, the circumflex could be treated percutaneously. DD:  09/19/00 TD:  09/21/00 Job: [id number]

## 2011-03-17 NOTE — Discharge Summary (Signed)
[place]. [facility]  Patient:    Nathan Miller, Nathan Miller                         MRN: [id number] Adm. Date:  [id number] Disc. Date: [id number] Attending:  Nelta Numbers Dictator:   Abelino Derrick, P.A.C. [facility] CC:         Rollene Rotunda, M.D. [facility]  Colon Flattery, D.O., [place], [place]   Referring Physician Discharge [place]  DISCHARGE DIAGNOSES: 1. Coronary disease, status post diaphragmatic myocardial infarction treated    with right coronary artery stenting. 2. History of smoking.  HISTORY OF PRESENT ILLNESS:  The patient is a 60 year old male with no prior medical problems, who presented to Dr. Meredith Leeds office with substernal chest pain and epigastric pain.  He was transferred to [facility]. [facility].  HOSPITAL COURSE:  He had some typical and atypical features.  He was started on IV heparin.  CK-MBs and troponins were obtained.  The EKG showed T-wave inversion in 2, 3, and F.  He had continued pain and his CK-MBs and troponins came back positive.  He was taken to the catheterization laboratory on September 19, 2000, by Daisey Must, M.D.  This revealed a total proximal RCA that was dilated and stented, as well as an RV branch.  He had residual 70% circumflex and 30% LAD.  He was kept on Integrilin for 24 hours.  Daisey Must, M.D., suggested a Cardiolite study in four to six weeks to assess his circumflex disease.  He was transferred to the floor and ambulated. Gerrit Friends. Dietrich Pates, M.D., feels that he can be discharged on September 22, 2000.  DISCHARGE MEDICATIONS: 1. Aspirin q.d. 2. Plavix 75 mg a day. 3. Toprol XL 50 mg a day. 4. Altace 10 mg a day. 5. Lopid 600 mg b.i.d.  LABORATORY DATA:  Sodium 139, potassium 3.6, BUN 8, creatinine 0.9.  Liver functions were slightly elevated with an SGOT of 48 and an SGPT of 44.  White count 10.2, hemoglobin 14.8, hematocrit 43.1, platelets 244.  CKs and troponins peaked at 17 and 79,  respectively, with 157 MB.  The lipid profile showed an LDL of 115, an HDL of 29, and triglycerides 137.  TSH 1.13.  The EKG showed sinus rhythm and inferior Q waves.  DISPOSITION:  The patient was discharged in stable condition.  FOLLOW-UP:  Will follow up with Rollene Rotunda, M.D., in a month. DD:  09/22/00 TD:  09/22/00 Job: [id number]

## 2011-03-20 ENCOUNTER — Ambulatory Visit (INDEPENDENT_AMBULATORY_CARE_PROVIDER_SITE_OTHER): Payer: BC Managed Care – PPO | Admitting: Infectious Diseases

## 2011-03-20 ENCOUNTER — Encounter: Payer: Self-pay | Admitting: Infectious Diseases

## 2011-03-20 DIAGNOSIS — M869 Osteomyelitis, unspecified: Secondary | ICD-10-CM

## 2011-03-20 DIAGNOSIS — R238 Other skin changes: Secondary | ICD-10-CM | POA: Insufficient documentation

## 2011-03-20 DIAGNOSIS — C4490 Unspecified malignant neoplasm of skin, unspecified: Secondary | ICD-10-CM

## 2011-03-20 LAB — CBC
HCT: 41.2 % (ref 39.0–52.0)
Hemoglobin: 13 g/dL (ref 13.0–17.0)
MCH: 29.7 pg (ref 26.0–34.0)
MCHC: 31.6 g/dL (ref 30.0–36.0)
MCV: 94.1 fL (ref 78.0–100.0)
Platelets: 364 10*3/uL (ref 150–400)
RBC: 4.38 MIL/uL (ref 4.22–5.81)
RDW: 18.6 % — ABNORMAL HIGH (ref 11.5–15.5)
WBC: 7.5 10*3/uL (ref 4.0–10.5)

## 2011-03-20 LAB — SEDIMENTATION RATE: Sed Rate: 25 mm/hr — ABNORMAL HIGH (ref 0–16)

## 2011-03-20 LAB — BASIC METABOLIC PANEL
BUN: 14 mg/dL (ref 6–23)
CO2: 29 mEq/L (ref 19–32)
Calcium: 9.3 mg/dL (ref 8.4–10.5)
Chloride: 102 mEq/L (ref 96–112)
Creat: 0.71 mg/dL (ref 0.40–1.50)
Glucose, Bld: 104 mg/dL — ABNORMAL HIGH (ref 70–99)
Potassium: 5.4 mEq/L — ABNORMAL HIGH (ref 3.5–5.3)
Sodium: 140 mEq/L (ref 135–145)

## 2011-03-20 LAB — C-REACTIVE PROTEIN: CRP: 1.2 mg/dL — ABNORMAL HIGH (ref <0.6)

## 2011-03-20 MED ORDER — LEVOFLOXACIN 500 MG PO TABS: 500.0000 mg | ORAL_TABLET | Freq: Every day | ORAL | Status: DC

## 2011-03-20 NOTE — Assessment & Plan Note (Addendum)
The lesoin on his face could be a large basal cell. Will have him seen by Derm, says that he will call and make his own appt.

## 2011-03-20 NOTE — Progress Notes (Signed)
PICC removed from right arm.  PICC site unremarkable. Labs were ordered prior to removal, unable obtain specimens due to PICC dysfunction.  PICC flushed well with saline but not blood would return.  44 cm PICC removed without complications. Petroleum gauge applied, patient advised to keep dressing in place for 24 hours . Patient tolerated the procedure.well. Labs were drawn from patient's left arm in our onsite lab. Pt  Nathan Devaul Gorden Harms, RN

## 2011-03-20 NOTE — Progress Notes (Signed)
Subjective:    Patient ID: Nathan Miller, male    DOB: [birth year], 60 y.o.   MRN: [id number]  HPI 60 yo M with neuropathic wound on his right foot dating back to the early parts   of 2011.  He initially was diagnosed with osteomyelitis of the right   fifth metatarsal.  He underwent a prolonged course of antibiotics with   culture showing Pseudomonas as well as HBO.  Although the wound improved   through the summer of 2011, this never really totally resolved.      He came into the wound clinic with a grossly infected wound   on the foot with cellulitis expanding up his leg.  He was admitted to   the hospital 58-5-12 to 02-09-11.  An MRI showed:  Florid osteomyelitis of the foot with medial plantar and medial   midfoot soft tissue abscesses.  Septic midfoot joints.  Ulceration with packing material extending to the lateral tarsometatarsal   junction. 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 His blood cultures  were negative. His wound Cx was polymicrobial (no S aures, no GAS).  He was started on Imipenem (02-03-11) and then changed to meropenem 500 IV   q.8 h. to complete his home therapy (total of 6 weeks).  He completed his antibiotics yesterday (03-19-11).  Feels like his foot is doing better. Less fluid coming out of VAC ("hardly any"). He has f/u with Dr Shelle Iron tomorrow.    Review of Systems  Constitutional: Negative for fever, chills, appetite change and unexpected weight change.  Gastrointestinal: Negative for diarrhea and constipation.  Genitourinary: Negative for dysuria.  mowed his lawn last week with a riding mower.      Objective:   Physical Exam  Constitutional: He appears well-developed and well-nourished.  Eyes: EOM are normal. Pupils are equal, round, and reactive to light.  Neck: Neck supple.  Cardiovascular: Normal rate and regular rhythm.   Pulmonary/Chest: Effort normal and breath sounds normal.    Abdominal: Soft. Bowel sounds are normal. He exhibits no distension.  Musculoskeletal:       Feet:  Skin:             Assessment & Plan:

## 2011-03-20 NOTE — Assessment & Plan Note (Signed)
He has finnished his IV therapy. Will change him to po levaquin and see him back in 6 weeks to re-assess. Could consider a repeat MRI at that time. Will check his ESR and CRP today.

## 2011-03-21 ENCOUNTER — Encounter: Payer: Self-pay | Admitting: Infectious Diseases

## 2011-03-22 ENCOUNTER — Other Ambulatory Visit: Payer: Self-pay | Admitting: *Deleted

## 2011-03-22 DIAGNOSIS — M869 Osteomyelitis, unspecified: Secondary | ICD-10-CM

## 2011-03-22 MED ORDER — LEVOFLOXACIN 500 MG PO TABS: 500.0000 mg | ORAL_TABLET | Freq: Every day | ORAL | Status: AC

## 2011-03-22 NOTE — Telephone Encounter (Signed)
He cannot find the rx he got for levaquin. I will send it to his pharmacy [facility] pharmacy [phone])

## 2011-04-06 ENCOUNTER — Encounter (HOSPITAL_BASED_OUTPATIENT_CLINIC_OR_DEPARTMENT_OTHER): Payer: BC Managed Care – PPO | Attending: Internal Medicine

## 2011-04-06 DIAGNOSIS — M869 Osteomyelitis, unspecified: Secondary | ICD-10-CM | POA: Insufficient documentation

## 2011-05-10 ENCOUNTER — Encounter: Payer: Self-pay | Admitting: Infectious Diseases

## 2011-05-10 ENCOUNTER — Ambulatory Visit (INDEPENDENT_AMBULATORY_CARE_PROVIDER_SITE_OTHER): Payer: BC Managed Care – PPO | Admitting: Infectious Diseases

## 2011-05-10 DIAGNOSIS — I10 Essential (primary) hypertension: Secondary | ICD-10-CM

## 2011-05-10 DIAGNOSIS — M869 Osteomyelitis, unspecified: Secondary | ICD-10-CM

## 2011-05-10 DIAGNOSIS — R238 Other skin changes: Secondary | ICD-10-CM

## 2011-05-10 DIAGNOSIS — L988 Other specified disorders of the skin and subcutaneous tissue: Secondary | ICD-10-CM

## 2011-05-10 NOTE — Progress Notes (Signed)
Subjective:    Patient ID: Nathan Miller, male    DOB: 10-08-1951, 60 y.o.   MRN: [id number]  HPI 60 yo M with neuropathic wound on his right foot dating back to the early parts  of 2011. He initially was diagnosed with osteomyelitis of the right fifth metatarsal. He underwent a prolonged course of antibiotics with  culture showing Pseudomonas as well as HBO. Although the wound improved through the summer of 2011, this never really totally resolved.  He came into the wound clinic with a grossly infected wound on the foot with cellulitis expanding up his leg. He was admitted to  the hospital 02-02-11 to 02-09-11. An MRI showed: Florid osteomyelitis of the foot with medial plantar and medial  midfoot soft tissue abscesses. Septic midfoot joints. Ulceration with packing material extending to the lateral tarsometatarsal  junction. 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 His blood cultures  were negative. His wound Cx was polymicrobial (no S aureus, no GAS). He was started on Imipenem (02-03-11) and then changed to meropenem 500 IV  q.8 h. to complete his home therapy (total of 6 weeks). He completed his antibiotics 03-19-11 and was changed to po levaquin. He has f/u with Dr Shelle Iron.   Today feels like his foot is doing fine. Is going to wound care center to have wound VAC evaluated and possibly removed. Completed levaquin 8 days ago.    Review of Systems  Constitutional: Negative for fever, chills and unexpected weight change.  Eyes: Negative for visual disturbance.  Respiratory: Negative for shortness of breath.   Cardiovascular: Negative for chest pain.  Neurological: Negative for headaches.       Objective:   Physical Exam  Constitutional: He appears well-developed and well-nourished.  Musculoskeletal:       Feet:          Assessment & Plan:

## 2011-05-10 NOTE — Assessment & Plan Note (Signed)
Will refer him to derm.

## 2011-05-10 NOTE — Assessment & Plan Note (Addendum)
States he was given rx by dr Roxan Hockey but quit taking it. He will f/u with Dr Leanord Hawking.

## 2011-05-10 NOTE — Assessment & Plan Note (Signed)
He appears to be doing well. Will watch him off anbx at this point. He has f/u with ortho in the AM and with wound care soon as well. Will see him back in 4 months to re-assess.

## 2011-05-11 DIAGNOSIS — S91309A Unspecified open wound, unspecified foot, initial encounter: Secondary | ICD-10-CM | POA: Insufficient documentation

## 2011-05-11 DIAGNOSIS — X58XXXA Exposure to other specified factors, initial encounter: Secondary | ICD-10-CM | POA: Insufficient documentation

## 2011-05-25 ENCOUNTER — Encounter (HOSPITAL_BASED_OUTPATIENT_CLINIC_OR_DEPARTMENT_OTHER): Payer: BC Managed Care – PPO | Attending: Internal Medicine

## 2011-06-08 ENCOUNTER — Encounter (HOSPITAL_BASED_OUTPATIENT_CLINIC_OR_DEPARTMENT_OTHER): Payer: BC Managed Care – PPO | Attending: General Surgery

## 2011-06-08 DIAGNOSIS — X58XXXA Exposure to other specified factors, initial encounter: Secondary | ICD-10-CM | POA: Insufficient documentation

## 2011-06-08 DIAGNOSIS — S91309A Unspecified open wound, unspecified foot, initial encounter: Secondary | ICD-10-CM | POA: Insufficient documentation

## 2011-07-13 ENCOUNTER — Encounter (HOSPITAL_BASED_OUTPATIENT_CLINIC_OR_DEPARTMENT_OTHER): Payer: BC Managed Care – PPO | Attending: Internal Medicine

## 2011-07-13 DIAGNOSIS — T8189XA Other complications of procedures, not elsewhere classified, initial encounter: Secondary | ICD-10-CM | POA: Insufficient documentation

## 2011-07-13 DIAGNOSIS — Y838 Other surgical procedures as the cause of abnormal reaction of the patient, or of later complication, without mention of misadventure at the time of the procedure: Secondary | ICD-10-CM | POA: Insufficient documentation

## 2011-07-13 DIAGNOSIS — G5790 Unspecified mononeuropathy of unspecified lower limb: Secondary | ICD-10-CM | POA: Insufficient documentation

## 2011-07-13 DIAGNOSIS — M25879 Other specified joint disorders, unspecified ankle and foot: Secondary | ICD-10-CM | POA: Insufficient documentation

## 2011-08-17 ENCOUNTER — Encounter (HOSPITAL_BASED_OUTPATIENT_CLINIC_OR_DEPARTMENT_OTHER): Payer: BC Managed Care – PPO | Attending: Internal Medicine

## 2011-08-17 DIAGNOSIS — M25879 Other specified joint disorders, unspecified ankle and foot: Secondary | ICD-10-CM | POA: Insufficient documentation

## 2011-08-17 DIAGNOSIS — T8189XA Other complications of procedures, not elsewhere classified, initial encounter: Secondary | ICD-10-CM | POA: Insufficient documentation

## 2011-08-17 DIAGNOSIS — Y838 Other surgical procedures as the cause of abnormal reaction of the patient, or of later complication, without mention of misadventure at the time of the procedure: Secondary | ICD-10-CM | POA: Insufficient documentation

## 2011-08-17 DIAGNOSIS — G5790 Unspecified mononeuropathy of unspecified lower limb: Secondary | ICD-10-CM | POA: Insufficient documentation

## 2011-09-14 ENCOUNTER — Encounter (HOSPITAL_BASED_OUTPATIENT_CLINIC_OR_DEPARTMENT_OTHER): Payer: BC Managed Care – PPO | Attending: Internal Medicine

## 2011-09-14 DIAGNOSIS — M86679 Other chronic osteomyelitis, unspecified ankle and foot: Secondary | ICD-10-CM | POA: Insufficient documentation

## 2011-09-14 DIAGNOSIS — L97509 Non-pressure chronic ulcer of other part of unspecified foot with unspecified severity: Secondary | ICD-10-CM | POA: Insufficient documentation

## 2011-09-14 DIAGNOSIS — I739 Peripheral vascular disease, unspecified: Secondary | ICD-10-CM | POA: Insufficient documentation

## 2011-10-12 ENCOUNTER — Encounter (HOSPITAL_BASED_OUTPATIENT_CLINIC_OR_DEPARTMENT_OTHER): Payer: BC Managed Care – PPO | Attending: Internal Medicine

## 2011-10-12 DIAGNOSIS — L97509 Non-pressure chronic ulcer of other part of unspecified foot with unspecified severity: Secondary | ICD-10-CM | POA: Insufficient documentation

## 2011-10-12 NOTE — H&P (Signed)
NAME:  [name], [name] NO.:  [id number]  MEDICAL RECORD NO.:  [id number]  LOCATION:  FOOT                         FACILITY:  [facility]  PHYSICIAN:  Ardath Sax, M.D.     DATE OF BIRTH:  July 01, 1951  DATE OF ADMISSION:  10/12/2011 DATE OF DISCHARGE:                             HISTORY & PHYSICAL   Nathan Miller is a 60 year old man who has really a nonhealing ulcer on the lateral side of his right foot.  Dr. Leanord Hawking sent him back to his orthopedic man for evaluation for further surgery.  He has already had some of the metatarsal bones removed, but Dr. Leanord Hawking and I certainly agree, it looks like this is not going anywhere and it has been x-rayed, perhaps showing an osteo.  He has already gone HBO.  Because it looked angry, today I debrided it and we put some Silvercel and a dressing. Apparently, they are not able to give him any more HBO treatments.  We put him back on doxycycline for another month, and I really think it is going to take the orthopedist going and do some more debridements here for going to save this foot, but apparently he saw this and felt like he did not want to do any further debridement of the bone at this time, so we will keep treating him here and changing the dressing and put him on doxycycline.     Ardath Sax, M.D.     PP/MEDQ  D:  10/12/2011  T:  10/12/2011  Job:  [id number]

## 2011-10-19 ENCOUNTER — Encounter (HOSPITAL_BASED_OUTPATIENT_CLINIC_OR_DEPARTMENT_OTHER): Payer: BC Managed Care – PPO

## 2011-11-16 ENCOUNTER — Encounter (HOSPITAL_BASED_OUTPATIENT_CLINIC_OR_DEPARTMENT_OTHER): Payer: BC Managed Care – PPO | Attending: Internal Medicine

## 2011-11-16 DIAGNOSIS — L97509 Non-pressure chronic ulcer of other part of unspecified foot with unspecified severity: Secondary | ICD-10-CM | POA: Insufficient documentation

## 2011-11-23 ENCOUNTER — Ambulatory Visit
Admission: RE | Admit: 2011-11-23 | Discharge: 2011-11-23 | Disposition: A | Discharge status: Home / Self Care | Payer: BC Managed Care – PPO | Source: Ambulatory Visit | Attending: Internal Medicine | Admitting: Internal Medicine

## 2011-11-23 ENCOUNTER — Other Ambulatory Visit: Payer: Self-pay | Admitting: Surgery

## 2011-11-23 ENCOUNTER — Other Ambulatory Visit (HOSPITAL_BASED_OUTPATIENT_CLINIC_OR_DEPARTMENT_OTHER): Payer: Self-pay | Admitting: Internal Medicine

## 2011-11-23 DIAGNOSIS — R05 Cough: Secondary | ICD-10-CM

## 2011-11-23 DIAGNOSIS — R059 Cough, unspecified: Secondary | ICD-10-CM

## 2011-12-21 ENCOUNTER — Encounter (HOSPITAL_BASED_OUTPATIENT_CLINIC_OR_DEPARTMENT_OTHER): Payer: BC Managed Care – PPO | Attending: Internal Medicine

## 2011-12-21 DIAGNOSIS — L97509 Non-pressure chronic ulcer of other part of unspecified foot with unspecified severity: Secondary | ICD-10-CM | POA: Insufficient documentation

## 2012-01-04 ENCOUNTER — Encounter (HOSPITAL_BASED_OUTPATIENT_CLINIC_OR_DEPARTMENT_OTHER): Payer: BC Managed Care – PPO | Attending: Internal Medicine

## 2012-01-04 DIAGNOSIS — M869 Osteomyelitis, unspecified: Secondary | ICD-10-CM | POA: Insufficient documentation

## 2012-02-01 ENCOUNTER — Encounter (HOSPITAL_BASED_OUTPATIENT_CLINIC_OR_DEPARTMENT_OTHER): Payer: BC Managed Care – PPO | Attending: Internal Medicine

## 2012-02-01 DIAGNOSIS — M869 Osteomyelitis, unspecified: Secondary | ICD-10-CM | POA: Insufficient documentation

## 2012-02-07 ENCOUNTER — Ambulatory Visit (INDEPENDENT_AMBULATORY_CARE_PROVIDER_SITE_OTHER): Payer: BC Managed Care – PPO | Admitting: Cardiovascular Disease

## 2012-02-07 ENCOUNTER — Encounter: Payer: Self-pay | Admitting: Cardiovascular Disease

## 2012-02-07 VITALS — BP 110/70 | HR 78 | Ht 66.0 in | Wt 215.0 lb

## 2012-02-07 DIAGNOSIS — R0600 Dyspnea, unspecified: Secondary | ICD-10-CM

## 2012-02-07 DIAGNOSIS — R0609 Other forms of dyspnea: Secondary | ICD-10-CM

## 2012-02-07 DIAGNOSIS — M869 Osteomyelitis, unspecified: Secondary | ICD-10-CM

## 2012-02-07 DIAGNOSIS — I10 Essential (primary) hypertension: Secondary | ICD-10-CM

## 2012-02-07 DIAGNOSIS — R0989 Other specified symptoms and signs involving the circulatory and respiratory systems: Secondary | ICD-10-CM

## 2012-02-07 DIAGNOSIS — I2510 Atherosclerotic heart disease of native coronary artery without angina pectoris: Secondary | ICD-10-CM

## 2012-02-07 NOTE — Patient Instructions (Signed)
Your physician wants you to follow-up in: 6 months in the [facility] clinic You will receive a reminder letter in the mail two months in advance. If you don't receive a letter, please call our office to schedule the follow-up appointment.  Your physician has requested that you have a lexiscan myoview. For further information please visit [email or web address] Please follow instruction sheet, as given.

## 2012-02-09 ENCOUNTER — Encounter: Payer: Self-pay | Admitting: Cardiovascular Disease

## 2012-02-09 NOTE — Assessment & Plan Note (Signed)
His blood pressure is well controlled. Continue current medications. 

## 2012-02-09 NOTE — Progress Notes (Signed)
HPI  This is a 61 year old gentleman who is here today for evaluation of exertional dyspnea. He reports no chest pain. The patient had non-ST elevation myocardial infarction in 2001. At that time, cardiac catheterization showed an occluded mid RCA with large thrombus. He had an angioplasty in 2 bare-metal stent placements which was complicated by distal embolization into the PL branch. There was a 70% stenosis in the mid left circumflex and 40% stenosis in the proximal LAD. He was supposed to get a followup stress test to evaluate the significance of the left circumflex stenosis but it appears that was not done. The patient has not been seen by a cardiologist since then. He has noticed progressive exertional dyspnea but that he has not had chest pain or tightness. He has borderline diabetes as well as hypertension. He does not report history of hyperlipidemia and he is not on a statin. 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 He is not aware if he has been evaluated for peripheral arterial disease. He is currently wearing a brace.  Allergies  Allergen Reactions  . Penicillins      Current Outpatient Prescriptions on File Prior to Visit  Medication Sig Dispense Refill  . ALPRAZolam (XANAX) 0.5 MG tablet Take 0.5 mg by mouth 2 (two) times daily.        [name] aspirin 81 MG tablet Take 81 mg by mouth daily.        [name] amLODipine-benazepril (LOTREL) 5-20 MG per capsule as directed.      [name] ENSURE (ENSURE) Take 1 Can by mouth 3 (three) times daily between meals. As needed         Past Medical History  Diagnosis Date  . Osteomyelitis   . Diabetes mellitus   . CAD (coronary artery disease)     NSTEMI in 2001. Cardiac cath showed: LAD: 40% proximal, LCX: 70% mid, RCA thrombotic occlusion in mid segment. s/p PCI and 2 overlapped BMSs to RCA.   [name] Hypertension      Past Surgical History  Procedure Date  . Cardiac catheterization   . Coronary  angioplasty      Family History  Problem Relation Age of Onset  . Hypertension Mother   . Hypertension Father      History   Social History  . Marital Status: Married    Spouse Name: N/A    Number of Children: N/A  . Years of Education: N/A   Occupational History  . Not on file.   Social History Main Topics  . Smoking status: Former Smoker -- 0.3 packs/day for 40 years    Types: Cigarettes  . Smokeless tobacco: Former Neurosurgeon  . Alcohol Use: 3.6 oz/week    6 Cans of beer per week     beer  . Drug Use: No  . Sexually Active: Not on file   Other Topics Concern  . Not on file   Social History Narrative  . No narrative on file     ROS Constitutional: Negative for fever, chills, diaphoresis, activity change, appetite change and fatigue.  HENT: Negative for hearing loss, nosebleeds, congestion, sore throat, facial swelling, drooling, trouble swallowing, neck pain, voice change, sinus pressure and tinnitus.  Eyes: Negative for photophobia, pain, discharge and visual disturbance.  Respiratory: Negative for apnea, cough, chest tightness and wheezing.  Cardiovascular: Negative for chest pain, palpitations and leg swelling.  Gastrointestinal: Negative for nausea, vomiting, abdominal pain, diarrhea, constipation, blood in stool and abdominal distention.  Genitourinary: Negative for dysuria, urgency, frequency, hematuria and decreased urine volume.  Musculoskeletal: Negative for myalgias, back pain, joint swelling, arthralgias. Skin: Negative for color change, pallor, rash and wound.  Neurological: Negative for dizziness, tremors, seizures, syncope, speech difficulty, weakness, light-headedness, numbness and headaches.  Psychiatric/Behavioral: Negative for suicidal ideas, hallucinations, behavioral problems and agitation. The patient is not nervous/anxious.     PHYSICAL EXAM   BP 110/70  Pulse 78  Ht 5\' 6"  (1.676 m)  Wt 215 lb (97.523 kg)  BMI 34.70  kg/m2 Constitutional: He is oriented to person, place, and time. He appears well-developed and well-nourished. No distress.  HENT: No nasal discharge.  Head: Normocephalic and atraumatic.  Eyes: Pupils are equal and round. Right eye exhibits no discharge. Left eye exhibits no discharge.  Neck: Normal range of motion. Neck supple. No JVD present. No thyromegaly present.  Cardiovascular: Normal rate, regular rhythm, normal heart sounds and. Exam reveals no gallop and no friction rub. No murmur heard.  Pulmonary/Chest: Effort normal and breath sounds normal. No stridor. No respiratory distress. He has no wheezes. He has no rales. He exhibits no tenderness.  Abdominal: Soft. Bowel sounds are normal. He exhibits no distension. There is no tenderness. There is no rebound and no guarding.  Musculoskeletal: Normal range of motion. He exhibits no edema and no tenderness.  Neurological: He is alert and oriented to person, place, and time. Coordination normal.  Skin: Skin is warm and dry. No rash noted. He is not diaphoretic. No erythema. No pallor.  Psychiatric: He has a normal mood and affect. His behavior is normal. Judgment and thought content normal.       EKG: Normal sinus rhythm with no significant ST or T wave changes.   ASSESSMENT AND PLAN

## 2012-02-09 NOTE — Assessment & Plan Note (Signed)
He should get evaluation with an ABI to look for underlying peripheral arterial disease. That should be done and dyspnea future. I am unable to schedule him at this time as he is still wearing a brace.

## 2012-02-09 NOTE — Assessment & Plan Note (Signed)
The patient is reporting significant exertional dyspnea without chest pain. He has known history of coronary artery disease status post previous myocardial infarction in 2001 with stenting of the right coronary artery. He also had residual stenosis in the left circumflex and the left anterior descending artery. I suspect that he likely has progression of his underlying coronary artery disease. I recommend proceeding with a pharmacologic nuclear stress test for further evaluation. The patient is currently not able to exercise on a treadmill due to right leg injury and osteomyelitis. He is to continue aspirin for now. We discussed today the importance of lifestyle changes. I discussed with him the importance of being on a statin for plaque regression regardless of his lipid profile. The patient mentioned that his cholesterol has always been good and does not see the need to start treatment. I asked him to bring me a copy of his most recent lipid profile. I really feel strongly that Nathan Miller should be on a statin due to his established history of obstructive coronary artery disease.

## 2012-02-14 ENCOUNTER — Ambulatory Visit (HOSPITAL_COMMUNITY): Payer: BC Managed Care – PPO | Attending: Cardiology | Admitting: Radiology

## 2012-02-14 VITALS — BP 129/76 | Ht 66.0 in | Wt 215.0 lb

## 2012-02-14 DIAGNOSIS — R0609 Other forms of dyspnea: Secondary | ICD-10-CM | POA: Insufficient documentation

## 2012-02-14 DIAGNOSIS — E119 Type 2 diabetes mellitus without complications: Secondary | ICD-10-CM | POA: Insufficient documentation

## 2012-02-14 DIAGNOSIS — I10 Essential (primary) hypertension: Secondary | ICD-10-CM | POA: Insufficient documentation

## 2012-02-14 DIAGNOSIS — Z9861 Coronary angioplasty status: Secondary | ICD-10-CM | POA: Insufficient documentation

## 2012-02-14 DIAGNOSIS — R0989 Other specified symptoms and signs involving the circulatory and respiratory systems: Secondary | ICD-10-CM | POA: Insufficient documentation

## 2012-02-14 DIAGNOSIS — I252 Old myocardial infarction: Secondary | ICD-10-CM | POA: Insufficient documentation

## 2012-02-14 DIAGNOSIS — R0602 Shortness of breath: Secondary | ICD-10-CM

## 2012-02-14 DIAGNOSIS — I2510 Atherosclerotic heart disease of native coronary artery without angina pectoris: Secondary | ICD-10-CM

## 2012-02-14 DIAGNOSIS — R0600 Dyspnea, unspecified: Secondary | ICD-10-CM

## 2012-02-14 DIAGNOSIS — Z87891 Personal history of nicotine dependence: Secondary | ICD-10-CM | POA: Insufficient documentation

## 2012-02-14 MED ORDER — REGADENOSON 0.4 MG/5ML IV SOLN
0.4000 mg | Freq: Once | INTRAVENOUS | Status: AC
  Administered 2012-02-14: 0.4 mg via INTRAVENOUS

## 2012-02-14 MED ORDER — TECHNETIUM TC 99M TETROFOSMIN IV KIT
33.0000 | PACK | Freq: Once | INTRAVENOUS | Status: AC | PRN
  Administered 2012-02-14: 33 via INTRAVENOUS

## 2012-02-14 MED ORDER — TECHNETIUM TC 99M TETROFOSMIN IV KIT
10.9000 | PACK | Freq: Once | INTRAVENOUS | Status: AC | PRN
  Administered 2012-02-14: 10.9 via INTRAVENOUS

## 2012-02-14 NOTE — Progress Notes (Signed)
[facility] SITE 3 NUCLEAR MED [address] [place] [address] [phone]  Cardiology Nuclear Med Study  Nathan Miller is a 61 y.o. male     MRN : [id number]     DOB: 1951-06-11  Procedure Date: 02/14/2012  Nuclear Med Background Indication for Stress Test:  Evaluation for Ischemia History:  11/01MI NSTEMI, Heart Cath: CFX-70% LAD 40% Stent RCA, Angioplasty,  01/02 MPS: EF: 56% (-) significant ischemia Cardiac Risk Factors: History of Smoking, Hypertension and NIDDM  Symptoms:  DOE   Nuclear Pre-Procedure Caffeine/Decaff Intake:  None NPO After: 6pm   Lungs:  clear O2 Sat: 95% on room air. IV 0.9% NS with Angio Cath:  20g  IV Site: R Forearm  IV Started by:  Bonnita Levan, RN  Chest Size (in):  48 Cup Size: n/a  Height: 5\' 6"  (1.676 m)  Weight:  215 lb (97.523 kg)  BMI:  Body mass index is 34.70 kg/(m^2). Tech Comments:  N/A    Nuclear Med Study 1 or 2 day study: 1 day  Stress Test Type:  Eugenie Birks  Reading MD: Charlton Haws, MD  Order Authorizing Provider:  M.Arida  Resting Radionuclide: Technetium 50m Tetrofosmin  Resting Radionuclide Dose: 10.9 mCi   Stress Radionuclide:  Technetium 46m Tetrofosmin  Stress Radionuclide Dose: 32.9 mCi           Stress Protocol Rest HR: 82 Stress HR: 100  Rest BP: 129/76 Stress BP: 135/81  Exercise Time (min): n/a METS: n/a   Predicted Max HR: 162 bpm % Max HR: 61.73 bpm Rate Pressure Product: 21308   Dose of Adenosine (mg):  n/a Dose of Lexiscan: 0.4 mg  Dose of Atropine (mg): n/a Dose of Dobutamine: n/a mcg/kg/min (at max HR)  Stress Test Technologist: Milana Na, EMT-P  Nuclear Technologist:  Doyne Keel, CNMT     Rest Procedure:  Myocardial perfusion imaging was performed at rest 45 minutes following the intravenous administration of Technetium 49m Tetrofosmin. Rest ECG: NSR - Normal EKG  Stress Procedure:  The patient received IV Lexiscan 0.4 mg over 15-seconds.  Technetium 24m Tetrofosmin  injected at 30-seconds.  There were no significant changes with Lexiscan.  Quantitative spect images were obtained after a 45 minute delay. Stress ECG: No significant change from baseline ECG  QPS Raw Data Images:  Normal; no motion artifact; normal heart/lung ratio. Stress Images:  There is decreased uptake in the inferior wall. Rest Images:  There is decreased uptake in the inferior wall. Subtraction (SDS):  There is a fixed defect that is most consistent with a previous infarction. Transient Ischemic Dilatation (Normal <1.22):  1.31 Lung/Heart Ratio (Normal <0.45):  0.31  Quantitative Gated Spect Images QGS EDV:  133 ml QGS ESV:  59 ml  Impression Exercise Capacity:  Lexiscan with no exercise. BP Response:  Normal blood pressure response. Clinical Symptoms:  There is dyspnea. ECG Impression:  No significant ST segment change suggestive of ischemia. Comparison with Prior Nuclear Study: No images to compare  Overall Impression:  Small inferior and inferoseptal infarct at mid and basal level with no ischemia  LV Ejection Fraction: 55%.  LV Wall Motion:  NL LV Function; NL Wall Motion  Charlton Haws

## 2012-03-14 ENCOUNTER — Encounter (HOSPITAL_BASED_OUTPATIENT_CLINIC_OR_DEPARTMENT_OTHER): Payer: BC Managed Care – PPO | Attending: Internal Medicine

## 2012-03-14 DIAGNOSIS — S91309A Unspecified open wound, unspecified foot, initial encounter: Secondary | ICD-10-CM | POA: Insufficient documentation

## 2012-03-14 DIAGNOSIS — I2510 Atherosclerotic heart disease of native coronary artery without angina pectoris: Secondary | ICD-10-CM | POA: Insufficient documentation

## 2012-03-14 DIAGNOSIS — M86679 Other chronic osteomyelitis, unspecified ankle and foot: Secondary | ICD-10-CM | POA: Insufficient documentation

## 2012-03-14 DIAGNOSIS — C44319 Basal cell carcinoma of skin of other parts of face: Secondary | ICD-10-CM | POA: Insufficient documentation

## 2012-03-14 DIAGNOSIS — L97509 Non-pressure chronic ulcer of other part of unspecified foot with unspecified severity: Secondary | ICD-10-CM | POA: Insufficient documentation

## 2012-03-14 DIAGNOSIS — X58XXXA Exposure to other specified factors, initial encounter: Secondary | ICD-10-CM | POA: Insufficient documentation

## 2012-03-14 DIAGNOSIS — I10 Essential (primary) hypertension: Secondary | ICD-10-CM | POA: Insufficient documentation

## 2012-04-18 ENCOUNTER — Encounter (HOSPITAL_BASED_OUTPATIENT_CLINIC_OR_DEPARTMENT_OTHER): Payer: BC Managed Care – PPO | Attending: Internal Medicine

## 2012-04-18 DIAGNOSIS — L97509 Non-pressure chronic ulcer of other part of unspecified foot with unspecified severity: Secondary | ICD-10-CM | POA: Insufficient documentation

## 2012-04-18 DIAGNOSIS — X58XXXA Exposure to other specified factors, initial encounter: Secondary | ICD-10-CM | POA: Insufficient documentation

## 2012-04-18 DIAGNOSIS — C44319 Basal cell carcinoma of skin of other parts of face: Secondary | ICD-10-CM | POA: Insufficient documentation

## 2012-04-18 DIAGNOSIS — I2510 Atherosclerotic heart disease of native coronary artery without angina pectoris: Secondary | ICD-10-CM | POA: Insufficient documentation

## 2012-04-18 DIAGNOSIS — M86679 Other chronic osteomyelitis, unspecified ankle and foot: Secondary | ICD-10-CM | POA: Insufficient documentation

## 2012-04-18 DIAGNOSIS — I10 Essential (primary) hypertension: Secondary | ICD-10-CM | POA: Insufficient documentation

## 2012-04-18 DIAGNOSIS — S91309A Unspecified open wound, unspecified foot, initial encounter: Secondary | ICD-10-CM | POA: Insufficient documentation

## 2012-05-23 ENCOUNTER — Encounter (HOSPITAL_BASED_OUTPATIENT_CLINIC_OR_DEPARTMENT_OTHER): Payer: BC Managed Care – PPO | Attending: Internal Medicine

## 2012-05-23 DIAGNOSIS — S91309A Unspecified open wound, unspecified foot, initial encounter: Secondary | ICD-10-CM | POA: Insufficient documentation

## 2012-05-23 DIAGNOSIS — M86679 Other chronic osteomyelitis, unspecified ankle and foot: Secondary | ICD-10-CM | POA: Insufficient documentation

## 2012-05-23 DIAGNOSIS — Z79899 Other long term (current) drug therapy: Secondary | ICD-10-CM | POA: Insufficient documentation

## 2012-05-23 DIAGNOSIS — X58XXXA Exposure to other specified factors, initial encounter: Secondary | ICD-10-CM | POA: Insufficient documentation

## 2012-05-23 DIAGNOSIS — L84 Corns and callosities: Secondary | ICD-10-CM | POA: Insufficient documentation

## 2012-05-23 DIAGNOSIS — I10 Essential (primary) hypertension: Secondary | ICD-10-CM | POA: Insufficient documentation

## 2012-06-20 ENCOUNTER — Encounter (HOSPITAL_BASED_OUTPATIENT_CLINIC_OR_DEPARTMENT_OTHER): Payer: BC Managed Care – PPO

## 2012-08-22 ENCOUNTER — Encounter (HOSPITAL_BASED_OUTPATIENT_CLINIC_OR_DEPARTMENT_OTHER): Payer: BC Managed Care – PPO | Attending: Internal Medicine

## 2012-08-22 DIAGNOSIS — L84 Corns and callosities: Secondary | ICD-10-CM | POA: Insufficient documentation

## 2012-08-22 DIAGNOSIS — L97509 Non-pressure chronic ulcer of other part of unspecified foot with unspecified severity: Secondary | ICD-10-CM | POA: Insufficient documentation

## 2012-08-22 DIAGNOSIS — M86679 Other chronic osteomyelitis, unspecified ankle and foot: Secondary | ICD-10-CM | POA: Insufficient documentation

## 2012-09-05 ENCOUNTER — Encounter (HOSPITAL_BASED_OUTPATIENT_CLINIC_OR_DEPARTMENT_OTHER): Payer: BC Managed Care – PPO | Attending: Internal Medicine

## 2012-09-05 DIAGNOSIS — M86679 Other chronic osteomyelitis, unspecified ankle and foot: Secondary | ICD-10-CM | POA: Insufficient documentation

## 2012-09-05 DIAGNOSIS — L97509 Non-pressure chronic ulcer of other part of unspecified foot with unspecified severity: Secondary | ICD-10-CM | POA: Insufficient documentation

## 2012-10-03 ENCOUNTER — Encounter (HOSPITAL_BASED_OUTPATIENT_CLINIC_OR_DEPARTMENT_OTHER): Payer: BC Managed Care – PPO | Attending: Internal Medicine

## 2012-10-03 DIAGNOSIS — M86679 Other chronic osteomyelitis, unspecified ankle and foot: Secondary | ICD-10-CM | POA: Insufficient documentation

## 2012-10-03 DIAGNOSIS — L97509 Non-pressure chronic ulcer of other part of unspecified foot with unspecified severity: Secondary | ICD-10-CM | POA: Insufficient documentation

## 2012-11-07 ENCOUNTER — Encounter (HOSPITAL_BASED_OUTPATIENT_CLINIC_OR_DEPARTMENT_OTHER): Payer: BC Managed Care – PPO | Attending: Internal Medicine

## 2012-11-07 DIAGNOSIS — M86679 Other chronic osteomyelitis, unspecified ankle and foot: Secondary | ICD-10-CM | POA: Insufficient documentation

## 2012-11-07 DIAGNOSIS — L97509 Non-pressure chronic ulcer of other part of unspecified foot with unspecified severity: Secondary | ICD-10-CM | POA: Insufficient documentation

## 2012-12-12 ENCOUNTER — Encounter (HOSPITAL_BASED_OUTPATIENT_CLINIC_OR_DEPARTMENT_OTHER): Payer: BC Managed Care – PPO | Attending: Internal Medicine

## 2012-12-12 DIAGNOSIS — L97509 Non-pressure chronic ulcer of other part of unspecified foot with unspecified severity: Secondary | ICD-10-CM | POA: Insufficient documentation

## 2012-12-12 DIAGNOSIS — M86679 Other chronic osteomyelitis, unspecified ankle and foot: Secondary | ICD-10-CM | POA: Insufficient documentation

## 2013-01-23 ENCOUNTER — Encounter (HOSPITAL_BASED_OUTPATIENT_CLINIC_OR_DEPARTMENT_OTHER): Payer: BC Managed Care – PPO | Attending: Internal Medicine

## 2013-01-23 DIAGNOSIS — L97509 Non-pressure chronic ulcer of other part of unspecified foot with unspecified severity: Secondary | ICD-10-CM | POA: Insufficient documentation

## 2013-01-23 DIAGNOSIS — M86679 Other chronic osteomyelitis, unspecified ankle and foot: Secondary | ICD-10-CM | POA: Insufficient documentation

## 2013-01-23 DIAGNOSIS — G589 Mononeuropathy, unspecified: Secondary | ICD-10-CM | POA: Insufficient documentation

## 2013-02-11 ENCOUNTER — Encounter: Payer: Self-pay | Admitting: Internal Medicine

## 2013-02-11 ENCOUNTER — Ambulatory Visit (INDEPENDENT_AMBULATORY_CARE_PROVIDER_SITE_OTHER): Payer: BLUE CROSS/BLUE SHIELD | Admitting: Internal Medicine

## 2013-02-11 VITALS — BP 168/98 | HR 79 | Temp 98.4°F | Resp 15 | Ht 66.0 in | Wt 243.0 lb

## 2013-02-11 DIAGNOSIS — E119 Type 2 diabetes mellitus without complications: Secondary | ICD-10-CM

## 2013-02-11 DIAGNOSIS — I10 Essential (primary) hypertension: Secondary | ICD-10-CM

## 2013-02-11 DIAGNOSIS — E669 Obesity, unspecified: Secondary | ICD-10-CM

## 2013-02-11 DIAGNOSIS — F411 Generalized anxiety disorder: Secondary | ICD-10-CM

## 2013-02-11 DIAGNOSIS — E1169 Type 2 diabetes mellitus with other specified complication: Secondary | ICD-10-CM

## 2013-02-11 DIAGNOSIS — E785 Hyperlipidemia, unspecified: Secondary | ICD-10-CM | POA: Insufficient documentation

## 2013-02-11 DIAGNOSIS — F17200 Nicotine dependence, unspecified, uncomplicated: Secondary | ICD-10-CM | POA: Insufficient documentation

## 2013-02-11 DIAGNOSIS — I2510 Atherosclerotic heart disease of native coronary artery without angina pectoris: Secondary | ICD-10-CM

## 2013-02-11 MED ORDER — ATORVASTATIN CALCIUM 10 MG PO TABS: 10.0000 mg | ORAL_TABLET | Freq: Every day | ORAL | Status: DC

## 2013-02-11 MED ORDER — METOPROLOL SUCCINATE 12.5 MG HALF TABLET: 12.5000 mg | ORAL_TABLET | Freq: Two times a day (BID) | ORAL | Status: DC

## 2013-02-11 NOTE — Progress Notes (Signed)
Subjective:    Patient ID: Nathan Miller, male    DOB: 29-Mar-1951, 62 y.o.   MRN: [id number]  Allergies  Allergen Reactions  . Penicillins    HPI 62 y/o male patient here for routine visit. Reviewed his lab results. He has history of cad but is not on any b blocker or statin. He has cut down on smoking and is smoking 1-3 cigarettes a day for now Has elevated bp in office. Denies any headache, chest pain, SOB and abdominal pain. Denies any blurry vision or focal weakness He tries to be careful with his meal- has cut down on pork and beef meat consumption, has been trying to take vegetables 2-3 times a week- more of potatoes and corn, does not take green vegetable Uses stationary bike 4-5 days a week for 5 minutes daily Denies any sores/ open wounds in his legs other than follow up for his right leg wound with wound care clinic.  Review of Systems  Constitutional: Positive for fatigue. Negative for chills, activity change and appetite change.  HENT: Negative for congestion and facial swelling.   Respiratory: Negative for cough and shortness of breath.   Cardiovascular: Negative for chest pain, palpitations and leg swelling.  Gastrointestinal: Negative for abdominal pain and abdominal distention.  Endocrine: Negative for polydipsia, polyphagia and polyuria.  Genitourinary: Negative for dysuria.  Musculoskeletal: Negative for joint swelling and arthralgias.  Neurological: Negative for dizziness, seizures, weakness and light-headedness.  Hematological: Negative for adenopathy.  Psychiatric/Behavioral: Negative for behavioral problems and agitation.      Objective:   Physical Exam  Constitutional: He is oriented to person, place, and time.  Obese, adult male in no acute distress  HENT:  Head: Normocephalic and atraumatic.  Mouth/Throat: Oropharynx is clear and moist.  Eyes: Conjunctivae are normal. Pupils are equal, round, and reactive to light.  Neck: Normal range of motion. Neck  supple. No JVD present.  Cardiovascular: Normal rate, regular rhythm and normal heart sounds.   Pulmonary/Chest: Effort normal and breath sounds normal.  Abdominal: Soft. Bowel sounds are normal. There is no tenderness.  Musculoskeletal: Normal range of motion. He exhibits no edema.  Lymphadenopathy:    He has no cervical adenopathy.  Neurological: He is alert and oriented to person, place, and time.  Skin: Skin is warm and dry. He is not diaphoretic.  Psychiatric: He has a normal mood and affect. His behavior is normal.   BP 168/98  Pulse 79  Temp(Src) 98.4 F (36.9 C) (Oral)  Resp 15  Ht 5\' 6"  (1.676 m)  Wt 243 lb (110.224 kg)  BMI 39.24 kg/m2  LABS REVIEWED- 01/09/13 wbc 6.9, hb 16.2, hct 49.2, plt 269, glu 169, bun 8, cr 0.86, na 138, k 5.1, cl 95, ca 9.9, lft wnl, a1c 6.5, t.chol 195, ldl 121, tg 175, hdl 39    Assessment & Plan:   HTN- continue amlodipine- benazepril 10-40 daily. Will introduce metoprolol 12.5 mg bid for now and need to keep bp goal < 130/80  CAD- NSTEMI in 2001. Cardiac cath showed: LAD: 40% proximal, LCX: 70% mid, RCA thrombotic occlusion in mid segment. s/p PCI and 2 overlapped BMSs to RCA. Not on any b blocker or  Statin. will start him on metoprolol succinate 12.5 mg q12h and atorvastatin 10 mg daily for now. Reviewed lipid panel. and b blocker. Continue aspirin 81 mg daily. To follow with cardiology  Hyperlipidemia- reviewed lipid panel. Start atorvastatin 10 mg daily, goal ldl < 100  Diabetes mellitus-  a1c and fasting blood glucose level suggestive of diabetes.pt wants to give dietary modification and exercise a chance and will recheck a1c in 2 months and if remains elevated will consider starting metformin given his obesity and other co-morbidities first. Continue asa. Will start statin today. Normal renal function. Continue ACEI  Anxiety- taking alprazolam 0.5 mg bid and symptoms have been under control  Tobacco user- has cut down on smoking,  encouraged to stop smoking  counselled on diet, smoking and exercise and written material provided on exercise and dietary habits  Recheck a1c and urine microalbumin prior to next visit

## 2013-02-11 NOTE — Patient Instructions (Signed)
Exercise to Lose Weight Exercise and a healthy diet may help you lose weight. Your doctor may suggest specific exercises. EXERCISE IDEAS AND TIPS  Choose low-cost things you enjoy doing, such as walking, bicycling, or exercising to workout videos.  Take stairs instead of the elevator.  Walk during your lunch break.  Park your car further away from work or school.  Go to a gym or an exercise class.  Start with 5 to 10 minutes of exercise each day. Build up to 30 minutes of exercise 4 to 6 days a week.  Wear shoes with good support and comfortable clothes.  Stretch before and after working out.  Work out until you breathe harder and your heart beats faster.  Drink extra water when you exercise.  Do not do so much that you hurt yourself, feel dizzy, or get very short of breath. Exercises that burn about 150 calories:  Running 1  miles in 15 minutes.  Playing volleyball for 45 to 60 minutes.  Washing and waxing a car for 45 to 60 minutes.  Playing touch football for 45 minutes.  Walking 1  miles in 35 minutes.  Pushing a stroller 1  miles in 30 minutes.  Playing basketball for 30 minutes.  Raking leaves for 30 minutes.  Bicycling 5 miles in 30 minutes.  Walking 2 miles in 30 minutes.  Dancing for 30 minutes.  Shoveling snow for 15 minutes.  Swimming laps for 20 minutes.  Walking up stairs for 15 minutes.  Bicycling 4 miles in 15 minutes.  Gardening for 30 to 45 minutes.  Jumping rope for 15 minutes.  Washing windows or floors for 45 to 60 minutes. Document Released: 11/18/2010 Document Revised: 01/08/2012 Document Reviewed: 11/18/2010 [employer] Patient Information 2013 [employer] 1200 Calorie Diabetic Diet The 1200 calorie diabetic diet limits calories to 1200 each day. Following this diet and making healthy meal choices can help improve overall health. It controls blood glucose (sugar) levels and can also help lower blood pressure and cholesterol.   SERVING SIZES Measuring foods and serving sizes helps to make sure you are getting the right amount of food. The list below tells how big or small some common serving sizes are.   1 oz.........4 stacked dice.  3 oz.........Deck of cards.  1 tsp........Tip of little finger.  1 tbs........Thumb.  2 tbs........Golf ball.   cup.......Half of a fist.  1 cup........A fist. GUIDELINES FOR CHOOSING FOODS The goal of this diet is to eat a variety of foods and limit calories to 1200 each day. This can be done by choosing foods that are low in calories and fat. The diet also suggests eating small amounts of food frequently. Doing this helps control your blood glucose levels, so they do not get too high or too low. Each meal or snack may include a protein food source to help you feel more satisfied. Try to eat about the same amount of food around the same time each day. This includes weekend days, travel days, and days off work. Space your meals about 4 to 5 hours apart, and add a snack between them, if you wish.  For example, a daily food plan could include breakfast, a morning snack, lunch, dinner, and an evening snack. Healthy meals and snacks have different types of foods, including whole grains, vegetables, fruits, lean meats, poultry, fish, and dairy products. As you plan your meals, select a variety of foods. Choose from the bread and starch, vegetable, fruit, dairy, and meat/protein groups. Examples   of foods from each group are listed below, with their suggested serving sizes. Use measuring cups and spoons to become familiar with what a healthy portion looks like. Bread and Starch Each serving equals 15 grams of carbohydrate.  1 slice bread.   bagel.   cup cold cereal (unsweetened).   cup hot cereal or mashed potatoes.  1 small potato (size of a computer mouse).   cup cooked pasta or rice.   English muffin.  1 cup broth-based soup.  3 cups of popcorn.  4 to 6 whole-wheat  crackers.   cup cooked beans, peas, or corn. Vegetables Each serving equals 5 grams of carbohydrate.   cup cooked vegetables.  1 cup raw vegetables.   cup tomato or vegetable juice. Fruit Each serving equals 15 grams of carbohydrate.  1 small apple or orange.  1  cup watermelon or strawberries.   cup applesauce (no sugar added).  2 tbs raisins.   banana.   cup canned fruit, packed in water or in its own juice.   cup unsweetened fruit juice. Dairy Each serving equals 12 to 15 grams of carbohydrate.  1 cup fat-free milk.  6 oz artificially sweetened yogurt or plain yogurt.  1 cup low-fat buttermilk.  1 cup soy milk.  1 cup almond milk. Meat/Protein  1 large egg.  2 to 3 oz meat, poultry, or fish.   cup low-fat cottage cheese.  1 tbs peanut butter.  1 oz low-fat cheese.   cup tuna, packed in water.   cup tofu. Fat  1 tsp oil.  1 tsp trans-fat-free margarine.  1 tsp butter.  1 tsp mayonnaise.  2 tbs avocado.  1 tbs salad dressing.  1 tbs cream cheese.  2 tbs sour cream. SAMPLE 1200 CALORIE DIET PLAN Breakfast  1 cup fat-free milk (1 carb serving).  1 small orange (1 carb serving).  1 scrambled egg.  1 slice whole-wheat toast (1 carb serving). Lunch  Sandwich  2 slices whole-wheat bread (2 carb servings).  2 oz lean meat.  2 tsp reduced fat mayonnaise.  1 lettuce leaf.  2 slices tomato.  1 cup carrot sticks. Afternoon Snack  1 small apple (1 carb serving).  1 string cheese. Dinner  2 oz meat.  1 small baked potato (1 carb serving).  1 tsp trans-fat-free margarine.  1 cup steamed broccoli.  1 cup fat-free milk (1 carb serving). Evening Snack   small banana (1 carb serving).  6 vanilla wafers (1 carb serving). MEAL PLAN You can use this worksheet to help you make a daily meal plan based on the 1200 calorie diabetic diet suggestions. If you are using this plan to help you control your blood  glucose, you may interchange carbohydrate containing foods (dairy, starches, and fruits). Select a variety of fresh foods of varying colors and flavors. The total amount of carbohydrate in your meals or snacks is more important than making sure you include all of the food groups every time you eat. You can choose from approximately this many of the following foods to build your day's meals:  6 Starches.  3 Vegetables.  2 Fruits.  2 Dairy.  4 to 6 oz Meat/Protein.  Up to 3 Fats. Your dietician can use this worksheet to help you decide how many servings and which types of foods are right for you. BREAKFAST Food Group and Servings / Food Choice Starch _________________________________________________________ Dairy __________________________________________________________ Fruit ___________________________________________________________ Meat/Protein____________________________________________________ Fat ____________________________________________________________ LUNCH Food Group and Servings / Food Choice    Starch _________________________________________________________ Meat/Protein ___________________________________________________ Vegetables _____________________________________________________ Fruit __________________________________________________________ Dairy __________________________________________________________ Fat ____________________________________________________________ AFTERNOON SNACK Food Group and Servings / Food Choice Dairy __________________________________________________________ Fruit ___________________________________________________________ Starch __________________________________________________________ Meat/Protein____________________________________________________ DINNER Food Group and Servings / Food Choice Starch _________________________________________________________ Meat/Protein ___________________________________________________ Dairy  __________________________________________________________ Vegetables _____________________________________________________ Fruit __________________________________________________________ Fat ____________________________________________________________ EVENING SNACK Food Group and Servings / Food Choice Fruit ___________________________________________________________ Meat/Protein ____________________________________________________ Dairy __________________________________________________________ Starch __________________________________________________________ DAILY TOTALS Starches _________________________ Vegetables _______________________ Fruits ____________________________ Dairy ____________________________ Meat/Protein______________________ Fats _____________________________ Document Released: 05/08/2005 Document Revised: 01/08/2012 Document Reviewed: 09/02/2009 [employer] Patient Information 2013 [employer].  

## 2013-02-20 ENCOUNTER — Encounter (HOSPITAL_BASED_OUTPATIENT_CLINIC_OR_DEPARTMENT_OTHER): Payer: BC Managed Care – PPO | Attending: Internal Medicine

## 2013-02-20 DIAGNOSIS — M8668 Other chronic osteomyelitis, other site: Secondary | ICD-10-CM | POA: Insufficient documentation

## 2013-02-20 DIAGNOSIS — L97509 Non-pressure chronic ulcer of other part of unspecified foot with unspecified severity: Secondary | ICD-10-CM | POA: Insufficient documentation

## 2013-02-20 DIAGNOSIS — L84 Corns and callosities: Secondary | ICD-10-CM | POA: Insufficient documentation

## 2013-03-20 ENCOUNTER — Encounter (HOSPITAL_BASED_OUTPATIENT_CLINIC_OR_DEPARTMENT_OTHER): Payer: BC Managed Care – PPO

## 2013-04-03 ENCOUNTER — Encounter (HOSPITAL_BASED_OUTPATIENT_CLINIC_OR_DEPARTMENT_OTHER): Payer: BC Managed Care – PPO | Attending: Internal Medicine

## 2013-04-03 DIAGNOSIS — M86679 Other chronic osteomyelitis, unspecified ankle and foot: Secondary | ICD-10-CM | POA: Insufficient documentation

## 2013-04-03 DIAGNOSIS — L97509 Non-pressure chronic ulcer of other part of unspecified foot with unspecified severity: Secondary | ICD-10-CM | POA: Insufficient documentation

## 2013-04-14 ENCOUNTER — Other Ambulatory Visit: Payer: BLUE CROSS/BLUE SHIELD

## 2013-04-16 ENCOUNTER — Ambulatory Visit: Payer: Self-pay | Admitting: Internal Medicine

## 2013-04-17 ENCOUNTER — Ambulatory Visit: Payer: Self-pay | Admitting: Internal Medicine

## 2013-05-01 ENCOUNTER — Encounter (HOSPITAL_BASED_OUTPATIENT_CLINIC_OR_DEPARTMENT_OTHER): Payer: BC Managed Care – PPO | Attending: Internal Medicine

## 2013-05-01 DIAGNOSIS — L97509 Non-pressure chronic ulcer of other part of unspecified foot with unspecified severity: Secondary | ICD-10-CM | POA: Insufficient documentation

## 2013-05-01 DIAGNOSIS — M86679 Other chronic osteomyelitis, unspecified ankle and foot: Secondary | ICD-10-CM | POA: Insufficient documentation

## 2013-05-13 ENCOUNTER — Other Ambulatory Visit: Payer: BLUE CROSS/BLUE SHIELD

## 2013-05-13 ENCOUNTER — Ambulatory Visit: Payer: Self-pay | Admitting: Internal Medicine

## 2013-05-14 ENCOUNTER — Ambulatory Visit: Payer: Self-pay | Admitting: Internal Medicine

## 2013-06-05 ENCOUNTER — Encounter (HOSPITAL_BASED_OUTPATIENT_CLINIC_OR_DEPARTMENT_OTHER): Payer: BC Managed Care – PPO

## 2013-06-26 ENCOUNTER — Encounter (HOSPITAL_BASED_OUTPATIENT_CLINIC_OR_DEPARTMENT_OTHER): Payer: BC Managed Care – PPO

## 2013-07-03 ENCOUNTER — Encounter (HOSPITAL_BASED_OUTPATIENT_CLINIC_OR_DEPARTMENT_OTHER): Payer: BC Managed Care – PPO | Attending: Internal Medicine

## 2013-07-03 DIAGNOSIS — L97509 Non-pressure chronic ulcer of other part of unspecified foot with unspecified severity: Secondary | ICD-10-CM | POA: Insufficient documentation

## 2013-07-03 DIAGNOSIS — M86679 Other chronic osteomyelitis, unspecified ankle and foot: Secondary | ICD-10-CM | POA: Insufficient documentation

## 2013-07-31 ENCOUNTER — Encounter (HOSPITAL_BASED_OUTPATIENT_CLINIC_OR_DEPARTMENT_OTHER): Payer: BC Managed Care – PPO | Attending: Internal Medicine

## 2013-07-31 DIAGNOSIS — M86679 Other chronic osteomyelitis, unspecified ankle and foot: Secondary | ICD-10-CM | POA: Insufficient documentation

## 2013-07-31 DIAGNOSIS — L97509 Non-pressure chronic ulcer of other part of unspecified foot with unspecified severity: Secondary | ICD-10-CM | POA: Insufficient documentation

## 2013-09-18 ENCOUNTER — Encounter (HOSPITAL_BASED_OUTPATIENT_CLINIC_OR_DEPARTMENT_OTHER): Payer: BC Managed Care – PPO | Attending: Internal Medicine

## 2013-10-02 ENCOUNTER — Encounter (HOSPITAL_BASED_OUTPATIENT_CLINIC_OR_DEPARTMENT_OTHER): Payer: Medicare Other | Attending: Internal Medicine

## 2013-10-02 DIAGNOSIS — L97509 Non-pressure chronic ulcer of other part of unspecified foot with unspecified severity: Secondary | ICD-10-CM | POA: Insufficient documentation

## 2013-10-02 DIAGNOSIS — M86679 Other chronic osteomyelitis, unspecified ankle and foot: Secondary | ICD-10-CM | POA: Insufficient documentation

## 2013-11-06 ENCOUNTER — Encounter (HOSPITAL_BASED_OUTPATIENT_CLINIC_OR_DEPARTMENT_OTHER): Payer: Medicare Other | Attending: Internal Medicine

## 2013-11-06 DIAGNOSIS — M86679 Other chronic osteomyelitis, unspecified ankle and foot: Secondary | ICD-10-CM | POA: Insufficient documentation

## 2013-11-06 DIAGNOSIS — L97509 Non-pressure chronic ulcer of other part of unspecified foot with unspecified severity: Secondary | ICD-10-CM | POA: Insufficient documentation

## 2013-12-25 ENCOUNTER — Encounter (HOSPITAL_BASED_OUTPATIENT_CLINIC_OR_DEPARTMENT_OTHER): Payer: BC Managed Care – PPO | Attending: Internal Medicine

## 2014-01-01 ENCOUNTER — Encounter (HOSPITAL_BASED_OUTPATIENT_CLINIC_OR_DEPARTMENT_OTHER): Payer: Medicare Other | Attending: Internal Medicine

## 2014-01-01 DIAGNOSIS — M86679 Other chronic osteomyelitis, unspecified ankle and foot: Secondary | ICD-10-CM | POA: Insufficient documentation

## 2014-01-01 DIAGNOSIS — L97509 Non-pressure chronic ulcer of other part of unspecified foot with unspecified severity: Secondary | ICD-10-CM | POA: Insufficient documentation

## 2014-02-12 ENCOUNTER — Encounter (HOSPITAL_BASED_OUTPATIENT_CLINIC_OR_DEPARTMENT_OTHER): Payer: Medicare Other | Attending: Internal Medicine

## 2014-03-05 ENCOUNTER — Encounter (HOSPITAL_BASED_OUTPATIENT_CLINIC_OR_DEPARTMENT_OTHER): Payer: Medicare Other | Attending: Internal Medicine

## 2014-03-05 DIAGNOSIS — L97509 Non-pressure chronic ulcer of other part of unspecified foot with unspecified severity: Secondary | ICD-10-CM | POA: Insufficient documentation

## 2014-03-05 DIAGNOSIS — M86679 Other chronic osteomyelitis, unspecified ankle and foot: Secondary | ICD-10-CM | POA: Insufficient documentation

## 2014-04-16 ENCOUNTER — Encounter (HOSPITAL_BASED_OUTPATIENT_CLINIC_OR_DEPARTMENT_OTHER): Payer: Medicare Other | Attending: Internal Medicine

## 2014-04-30 ENCOUNTER — Encounter (HOSPITAL_BASED_OUTPATIENT_CLINIC_OR_DEPARTMENT_OTHER): Payer: Medicare Other | Attending: Internal Medicine

## 2014-04-30 DIAGNOSIS — M86679 Other chronic osteomyelitis, unspecified ankle and foot: Secondary | ICD-10-CM | POA: Insufficient documentation

## 2014-04-30 DIAGNOSIS — L97509 Non-pressure chronic ulcer of other part of unspecified foot with unspecified severity: Secondary | ICD-10-CM | POA: Diagnosis not present

## 2014-06-04 ENCOUNTER — Encounter (HOSPITAL_BASED_OUTPATIENT_CLINIC_OR_DEPARTMENT_OTHER): Payer: Medicare Other | Attending: Internal Medicine

## 2014-06-04 DIAGNOSIS — M86679 Other chronic osteomyelitis, unspecified ankle and foot: Secondary | ICD-10-CM | POA: Insufficient documentation

## 2014-06-04 DIAGNOSIS — L97509 Non-pressure chronic ulcer of other part of unspecified foot with unspecified severity: Secondary | ICD-10-CM | POA: Insufficient documentation

## 2014-06-25 DIAGNOSIS — L97509 Non-pressure chronic ulcer of other part of unspecified foot with unspecified severity: Secondary | ICD-10-CM | POA: Diagnosis not present

## 2014-06-25 DIAGNOSIS — M86679 Other chronic osteomyelitis, unspecified ankle and foot: Secondary | ICD-10-CM | POA: Diagnosis present

## 2014-07-23 ENCOUNTER — Encounter (HOSPITAL_BASED_OUTPATIENT_CLINIC_OR_DEPARTMENT_OTHER): Payer: Medicare Other | Attending: Internal Medicine

## 2014-07-23 DIAGNOSIS — M86679 Other chronic osteomyelitis, unspecified ankle and foot: Secondary | ICD-10-CM | POA: Insufficient documentation

## 2014-07-23 DIAGNOSIS — L97509 Non-pressure chronic ulcer of other part of unspecified foot with unspecified severity: Secondary | ICD-10-CM | POA: Insufficient documentation

## 2014-07-23 DIAGNOSIS — G5790 Unspecified mononeuropathy of unspecified lower limb: Secondary | ICD-10-CM | POA: Diagnosis not present

## 2014-08-20 ENCOUNTER — Encounter (HOSPITAL_BASED_OUTPATIENT_CLINIC_OR_DEPARTMENT_OTHER): Payer: Medicare Other | Attending: Internal Medicine

## 2014-09-10 ENCOUNTER — Encounter (HOSPITAL_BASED_OUTPATIENT_CLINIC_OR_DEPARTMENT_OTHER): Payer: Medicare Other | Attending: Internal Medicine

## 2014-10-01 ENCOUNTER — Encounter (HOSPITAL_BASED_OUTPATIENT_CLINIC_OR_DEPARTMENT_OTHER): Payer: Medicare Other

## 2014-10-08 ENCOUNTER — Encounter (HOSPITAL_BASED_OUTPATIENT_CLINIC_OR_DEPARTMENT_OTHER): Payer: Medicare Other | Attending: Internal Medicine

## 2014-10-08 DIAGNOSIS — M86671 Other chronic osteomyelitis, right ankle and foot: Secondary | ICD-10-CM | POA: Diagnosis present

## 2014-10-08 DIAGNOSIS — L97512 Non-pressure chronic ulcer of other part of right foot with fat layer exposed: Secondary | ICD-10-CM | POA: Insufficient documentation

## 2014-10-29 DIAGNOSIS — L97512 Non-pressure chronic ulcer of other part of right foot with fat layer exposed: Secondary | ICD-10-CM | POA: Diagnosis not present

## 2014-10-29 DIAGNOSIS — M86671 Other chronic osteomyelitis, right ankle and foot: Secondary | ICD-10-CM | POA: Diagnosis not present

## 2014-11-06 ENCOUNTER — Encounter (HOSPITAL_BASED_OUTPATIENT_CLINIC_OR_DEPARTMENT_OTHER): Payer: Medicare Other | Attending: Internal Medicine

## 2014-12-03 ENCOUNTER — Encounter (HOSPITAL_BASED_OUTPATIENT_CLINIC_OR_DEPARTMENT_OTHER): Payer: Medicare Other | Attending: Internal Medicine

## 2014-12-03 DIAGNOSIS — L97512 Non-pressure chronic ulcer of other part of right foot with fat layer exposed: Secondary | ICD-10-CM | POA: Insufficient documentation

## 2014-12-03 DIAGNOSIS — G629 Polyneuropathy, unspecified: Secondary | ICD-10-CM | POA: Insufficient documentation

## 2014-12-03 DIAGNOSIS — M86671 Other chronic osteomyelitis, right ankle and foot: Secondary | ICD-10-CM | POA: Diagnosis present

## 2014-12-31 ENCOUNTER — Encounter (HOSPITAL_BASED_OUTPATIENT_CLINIC_OR_DEPARTMENT_OTHER): Payer: Medicare Other | Attending: Internal Medicine

## 2014-12-31 DIAGNOSIS — M868X7 Other osteomyelitis, ankle and foot: Secondary | ICD-10-CM | POA: Insufficient documentation

## 2014-12-31 DIAGNOSIS — L97419 Non-pressure chronic ulcer of right heel and midfoot with unspecified severity: Secondary | ICD-10-CM | POA: Insufficient documentation

## 2015-01-21 ENCOUNTER — Emergency Department (HOSPITAL_COMMUNITY): Payer: Medicare Other

## 2015-01-21 ENCOUNTER — Encounter (HOSPITAL_COMMUNITY): Payer: Self-pay | Admitting: *Deleted

## 2015-01-21 ENCOUNTER — Inpatient Hospital Stay (HOSPITAL_COMMUNITY)
Admission: EM | Admit: 2015-01-21 | Discharge: 2015-01-25 | DRG: 242 | Disposition: A | Discharge status: Home Health | Payer: Medicare Other | Attending: Internal Medicine | Admitting: Internal Medicine

## 2015-01-21 ENCOUNTER — Encounter (HOSPITAL_COMMUNITY)
Admission: EM | Disposition: A | Discharge status: Home / Self Care | Payer: Self-pay | Source: Home / Self Care | Attending: Internal Medicine

## 2015-01-21 DIAGNOSIS — I96 Gangrene, not elsewhere classified: Secondary | ICD-10-CM | POA: Diagnosis present

## 2015-01-21 DIAGNOSIS — F1010 Alcohol abuse, uncomplicated: Secondary | ICD-10-CM | POA: Diagnosis present

## 2015-01-21 DIAGNOSIS — Z9861 Coronary angioplasty status: Secondary | ICD-10-CM | POA: Diagnosis not present

## 2015-01-21 DIAGNOSIS — IMO0002 Reserved for concepts with insufficient information to code with codable children: Secondary | ICD-10-CM | POA: Diagnosis present

## 2015-01-21 DIAGNOSIS — I10 Essential (primary) hypertension: Secondary | ICD-10-CM | POA: Diagnosis present

## 2015-01-21 DIAGNOSIS — I459 Conduction disorder, unspecified: Secondary | ICD-10-CM | POA: Diagnosis not present

## 2015-01-21 DIAGNOSIS — I252 Old myocardial infarction: Secondary | ICD-10-CM

## 2015-01-21 DIAGNOSIS — Z880 Allergy status to penicillin: Secondary | ICD-10-CM

## 2015-01-21 DIAGNOSIS — Z9114 Patient's other noncompliance with medication regimen: Secondary | ICD-10-CM | POA: Diagnosis present

## 2015-01-21 DIAGNOSIS — E1140 Type 2 diabetes mellitus with diabetic neuropathy, unspecified: Secondary | ICD-10-CM | POA: Diagnosis present

## 2015-01-21 DIAGNOSIS — I469 Cardiac arrest, cause unspecified: Secondary | ICD-10-CM | POA: Diagnosis not present

## 2015-01-21 DIAGNOSIS — E1165 Type 2 diabetes mellitus with hyperglycemia: Secondary | ICD-10-CM | POA: Diagnosis present

## 2015-01-21 DIAGNOSIS — Z87891 Personal history of nicotine dependence: Secondary | ICD-10-CM

## 2015-01-21 DIAGNOSIS — R001 Bradycardia, unspecified: Secondary | ICD-10-CM | POA: Diagnosis present

## 2015-01-21 DIAGNOSIS — R55 Syncope and collapse: Secondary | ICD-10-CM | POA: Diagnosis not present

## 2015-01-21 DIAGNOSIS — I2510 Atherosclerotic heart disease of native coronary artery without angina pectoris: Secondary | ICD-10-CM

## 2015-01-21 DIAGNOSIS — I442 Atrioventricular block, complete: Secondary | ICD-10-CM | POA: Diagnosis not present

## 2015-01-21 DIAGNOSIS — M869 Osteomyelitis, unspecified: Secondary | ICD-10-CM | POA: Diagnosis present

## 2015-01-21 DIAGNOSIS — E876 Hypokalemia: Secondary | ICD-10-CM | POA: Diagnosis present

## 2015-01-21 DIAGNOSIS — L97519 Non-pressure chronic ulcer of other part of right foot with unspecified severity: Secondary | ICD-10-CM | POA: Diagnosis present

## 2015-01-21 DIAGNOSIS — R32 Unspecified urinary incontinence: Secondary | ICD-10-CM | POA: Diagnosis present

## 2015-01-21 DIAGNOSIS — E119 Type 2 diabetes mellitus without complications: Secondary | ICD-10-CM | POA: Diagnosis not present

## 2015-01-21 DIAGNOSIS — G253 Myoclonus: Secondary | ICD-10-CM | POA: Diagnosis present

## 2015-01-21 DIAGNOSIS — Z959 Presence of cardiac and vascular implant and graft, unspecified: Secondary | ICD-10-CM

## 2015-01-21 DIAGNOSIS — E1169 Type 2 diabetes mellitus with other specified complication: Secondary | ICD-10-CM

## 2015-01-21 DIAGNOSIS — E669 Obesity, unspecified: Secondary | ICD-10-CM

## 2015-01-21 DIAGNOSIS — I441 Atrioventricular block, second degree: Secondary | ICD-10-CM | POA: Diagnosis not present

## 2015-01-21 DIAGNOSIS — Z6836 Body mass index (BMI) 36.0-36.9, adult: Secondary | ICD-10-CM | POA: Diagnosis not present

## 2015-01-21 DIAGNOSIS — E785 Hyperlipidemia, unspecified: Secondary | ICD-10-CM | POA: Diagnosis present

## 2015-01-21 HISTORY — PX: TEMPORARY PACEMAKER INSERTION: SHX5471

## 2015-01-21 LAB — URINALYSIS, ROUTINE W REFLEX MICROSCOPIC
Bilirubin Urine: NEGATIVE
Glucose, UA: >1000 mg/dL — AB
Hgb urine dipstick: NEGATIVE
Ketones, ur: 15 mg/dL — AB
Leukocytes, UA: NEGATIVE
Nitrite: NEGATIVE
Protein, ur: NEGATIVE mg/dL
Specific Gravity, Urine: 1.031 — ABNORMAL HIGH (ref 1.005–1.030)
Urobilinogen, UA: 0.2 mg/dL (ref 0.0–1.0)
pH: 5 (ref 5.0–8.0)

## 2015-01-21 LAB — COMPREHENSIVE METABOLIC PANEL
ALT: 52 U/L (ref 0–53)
AST: 65 U/L — ABNORMAL HIGH (ref 0–37)
Albumin: 3.2 g/dL — ABNORMAL LOW (ref 3.5–5.2)
Alkaline Phosphatase: 84 U/L (ref 39–117)
Anion gap: 15 (ref 5–15)
BUN: 9 mg/dL (ref 6–23)
CO2: 19 mmol/L (ref 19–32)
Calcium: 8.5 mg/dL (ref 8.4–10.5)
Chloride: 103 mmol/L (ref 96–112)
Creatinine, Ser: 1.12 mg/dL (ref 0.50–1.35)
GFR calc Af Amer: 80 mL/min — ABNORMAL LOW (ref >90)
GFR calc non Af Amer: 69 mL/min — ABNORMAL LOW (ref >90)
Glucose, Bld: 417 mg/dL — ABNORMAL HIGH (ref 70–99)
Potassium: 4.4 mmol/L (ref 3.5–5.1)
Sodium: 137 mmol/L (ref 135–145)
Total Bilirubin: 0.4 mg/dL (ref 0.3–1.2)
Total Protein: 6.6 g/dL (ref 6.0–8.3)

## 2015-01-21 LAB — CBC WITH DIFFERENTIAL/PLATELET
Basophils Absolute: 0 10*3/uL (ref 0.0–0.1)
Basophils Relative: 0 % (ref 0–1)
Eosinophils Absolute: 0 10*3/uL (ref 0.0–0.7)
Eosinophils Relative: 0 % (ref 0–5)
HCT: 50.6 % (ref 39.0–52.0)
Hemoglobin: 16.5 g/dL (ref 13.0–17.0)
Lymphocytes Relative: 11 % — ABNORMAL LOW (ref 12–46)
Lymphs Abs: 1.1 10*3/uL (ref 0.7–4.0)
MCH: 31.8 pg (ref 26.0–34.0)
MCHC: 32.6 g/dL (ref 30.0–36.0)
MCV: 97.5 fL (ref 78.0–100.0)
Monocytes Absolute: 1 10*3/uL (ref 0.1–1.0)
Monocytes Relative: 9 % (ref 3–12)
Neutro Abs: 8.2 10*3/uL — ABNORMAL HIGH (ref 1.7–7.7)
Neutrophils Relative %: 80 % — ABNORMAL HIGH (ref 43–77)
Platelets: 207 10*3/uL (ref 150–400)
RBC: 5.19 MIL/uL (ref 4.22–5.81)
RDW: 13.2 % (ref 11.5–15.5)
WBC: 10.4 10*3/uL (ref 4.0–10.5)

## 2015-01-21 LAB — I-STAT VENOUS BLOOD GAS, ED
Acid-base deficit: 1 mmol/L (ref 0.0–2.0)
Bicarbonate: 26 mEq/L — ABNORMAL HIGH (ref 20.0–24.0)
O2 Saturation: 91 %
TCO2: 28 mmol/L (ref 0–100)
pCO2, Ven: 49.1 mmHg (ref 45.0–50.0)
pH, Ven: 7.333 — ABNORMAL HIGH (ref 7.250–7.300)
pO2, Ven: 65 mmHg — ABNORMAL HIGH (ref 30.0–45.0)

## 2015-01-21 LAB — CBG MONITORING, ED
Glucose-Capillary: 390 mg/dL — ABNORMAL HIGH (ref 70–99)
Glucose-Capillary: 330 mg/dL — ABNORMAL HIGH (ref 70–99)
Glucose-Capillary: 264 mg/dL — ABNORMAL HIGH (ref 70–99)

## 2015-01-21 LAB — URINE MICROSCOPIC-ADD ON

## 2015-01-21 LAB — RAPID URINE DRUG SCREEN, HOSP PERFORMED
Amphetamines: NOT DETECTED
Barbiturates: NOT DETECTED
Benzodiazepines: POSITIVE — AB
Cocaine: NOT DETECTED
Opiates: NOT DETECTED
Tetrahydrocannabinol: POSITIVE — AB

## 2015-01-21 LAB — MAGNESIUM: Magnesium: 2.4 mg/dL (ref 1.5–2.5)

## 2015-01-21 LAB — TROPONIN I: Troponin I: 0.09 ng/mL — ABNORMAL HIGH (ref <0.031)

## 2015-01-21 LAB — I-STAT CHEM 8, ED
BUN: 12 mg/dL (ref 6–23)
Calcium, Ion: 1.07 mmol/L — ABNORMAL LOW (ref 1.13–1.30)
Chloride: 101 mmol/L (ref 96–112)
Creatinine, Ser: 1 mg/dL (ref 0.50–1.35)
Glucose, Bld: 430 mg/dL — ABNORMAL HIGH (ref 70–99)
HCT: 54 % — ABNORMAL HIGH (ref 39.0–52.0)
Hemoglobin: 18.4 g/dL — ABNORMAL HIGH (ref 13.0–17.0)
Potassium: 4.6 mmol/L (ref 3.5–5.1)
Sodium: 138 mmol/L (ref 135–145)
TCO2: 16 mmol/L (ref 0–100)

## 2015-01-21 LAB — CK: Total CK: 67 U/L (ref 7–232)

## 2015-01-21 LAB — ETHANOL: Alcohol, Ethyl (B): <5 mg/dL (ref 0–9)

## 2015-01-21 LAB — I-STAT TROPONIN, ED: Troponin i, poc: 0.04 ng/mL (ref 0.00–0.08)

## 2015-01-21 SURGERY — TEMPORARY PACEMAKER INSERTION
Anesthesia: LOCAL

## 2015-01-21 MED ORDER — SODIUM CHLORIDE 0.9 % IV BOLUS (SEPSIS)
1000.0000 mL | Freq: Once | INTRAVENOUS | Status: AC
  Administered 2015-01-21: 1000 mL via INTRAVENOUS

## 2015-01-21 MED ORDER — LORAZEPAM 2 MG/ML IJ SOLN
INTRAMUSCULAR | Status: AC
  Filled 2015-01-21: qty 1

## 2015-01-21 MED ORDER — INSULIN ASPART 100 UNIT/ML ~~LOC~~ SOLN
10.0000 [IU] | Freq: Once | SUBCUTANEOUS | Status: AC
  Administered 2015-01-21: 10 [IU] via INTRAVENOUS
  Filled 2015-01-21: qty 1

## 2015-01-21 MED ORDER — MIDAZOLAM HCL 2 MG/2ML IJ SOLN
INTRAMUSCULAR | Status: AC
  Filled 2015-01-21: qty 2

## 2015-01-21 MED ORDER — LIDOCAINE HCL (PF) 1 % IJ SOLN
INTRAMUSCULAR | Status: AC
  Filled 2015-01-21: qty 30

## 2015-01-21 MED ORDER — CALCIUM GLUCONATE 10 % IV SOLN
1.0000 g | Freq: Once | INTRAVENOUS | Status: DC
  Filled 2015-01-21: qty 10

## 2015-01-21 MED ORDER — LORAZEPAM 2 MG/ML IJ SOLN
0.5000 mg | Freq: Once | INTRAMUSCULAR | Status: AC
  Administered 2015-01-21: 0.5 mg via INTRAVENOUS

## 2015-01-21 NOTE — ED Provider Notes (Signed)
CSN: [id number]     Arrival date & time 01/21/15  1800 History   First MD Initiated Contact with Patient 01/21/15 1806     Chief Complaint  Patient presents with  . Bradycardia  . Seizures     (Consider location/radiation/quality/duration/timing/severity/associated sxs/prior Treatment) HPI   Nathan Miller is a 64 year old male with past medical history of diabetes, coronary disease, hypertension. He presents today from home after being found down. Patient was found down in his driveway by his wife. Unknown how long he was down for, but could've possibly been up to 4 hours. EMS was called, when they arrived he was confused, had urinated on himself. During the ride to the hospital he gradually became more alert. He was noted to be bradycardic in the 30s, with a good blood pressure. Wife notes that over the past few weeks he has had multiple syncopal episodes.  She notes that often during them he will seem to have a couple of tonic clonic movements.  The episodes are typically short (<2min)  Past Medical History  Diagnosis Date  . Osteomyelitis   . Diabetes mellitus   . CAD (coronary artery disease)     NSTEMI in 2001. Cardiac cath showed: LAD: 40% proximal, LCX: 70% mid, RCA thrombotic occlusion in mid segment. s/p PCI and 2 overlapped BMSs to RCA.   [name] Hypertension    Past Surgical History  Procedure Laterality Date  . Cardiac catheterization    . Coronary angioplasty     Family History  Problem Relation Age of Onset  . Hypertension Mother   . Hypertension Father    History  Substance Use Topics  . Smoking status: Former Smoker -- 0.30 packs/day for 40 years    Types: Cigarettes  . Smokeless tobacco: Former Systems developer  . Alcohol Use: 3.6 oz/week    6 Cans of beer per week     Comment: beer    Review of Systems  Constitutional: Negative for fever and chills.  Eyes: Negative for redness.  Respiratory: Negative for cough and shortness of breath.   Cardiovascular: Negative for chest  pain.  Gastrointestinal: Negative for nausea, vomiting, abdominal pain and diarrhea.  Genitourinary: Negative for dysuria.  Skin: Negative for rash.  Neurological: Positive for syncope. Negative for headaches.  All other systems reviewed and are negative.     Allergies  Penicillins  Home Medications   Prior to Admission medications   Medication Sig Start Date End Date Taking? Authorizing Provider  ALPRAZolam Duanne Moron) 0.5 MG tablet Take 0.5 mg by mouth 2 (two) times daily.      Historical Provider, MD  amLODipine-benazepril (LOTREL) 10-40 MG per capsule Take one tablet once daily for blood pressure    Historical Provider, MD  aspirin 81 MG tablet Take 81 mg by mouth daily.      Historical Provider, MD  atorvastatin (LIPITOR) 10 MG tablet Take 1 tablet (10 mg total) by mouth daily. 02/11/13   Blanchie Serve, MD  ENSURE (ENSURE) Take 1 Can by mouth 3 (three) times daily between meals. As needed    Historical Provider, MD  metoprolol succinate (TOPROL-XL) 12.5 mg TB24 Take 0.5 tablets (12.5 mg total) by mouth 2 (two) times daily. 02/11/13   Mahima Pandey, MD   BP 160/64 mmHg  Pulse 36  Temp(Src) 98.2 F (36.8 C) (Oral)  Resp 26  SpO2 94% Physical Exam  Constitutional: He is oriented to person, place, and time. No distress.  HENT:  Head: Normocephalic and atraumatic.  Eyes: EOM  are normal. Pupils are equal, round, and reactive to light.  Neck: Normal range of motion. Neck supple.  Cardiovascular:  Bradycardic.  3rd degree heart block.  No other abnormalities noted  Pulmonary/Chest: Effort normal. No respiratory distress.  Abdominal: Soft. There is no tenderness.  Musculoskeletal: Normal range of motion.  Neurological: He is alert and oriented to person, place, and time. He has normal strength. No cranial nerve deficit or sensory deficit.  Skin: No rash noted. He is not diaphoretic.  Psychiatric: He has a normal mood and affect.  Vitals reviewed.   ED Course  Procedures  (including critical care time) Labs Review Labs Reviewed  CBG MONITORING, ED - Abnormal; Notable for the following:    Glucose-Capillary 390 (*)    All other components within normal limits  I-STAT CHEM 8, ED - Abnormal; Notable for the following:    Glucose, Bld 430 (*)    Calcium, Ion 1.07 (*)    Hemoglobin 18.4 (*)    HCT 54.0 (*)    All other components within normal limits  CBC WITH DIFFERENTIAL/PLATELET  COMPREHENSIVE METABOLIC PANEL  CK  URINALYSIS, ROUTINE W REFLEX MICROSCOPIC  ETHANOL  URINE RAPID DRUG SCREEN (HOSP PERFORMED)  MAGNESIUM  PYRUVIC ACID  I-STAT TROPOININ, ED  I-STAT VENOUS BLOOD GAS, ED    Imaging Review No results found.   EKG Interpretation   Date/Time:  Thursday January 21 2015 18:08:52 EDT Ventricular Rate:  35 PR Interval:  54 QRS Duration: 150 QT Interval:  690 QTC Calculation: 526 R Axis:   -86 Text Interpretation:  Sinus bradycardia Left bundle branch block Probable  RV involvement, suggest recording right precordial leads Confirmed by RAY  MD, Andee Poles [phone]) on 01/21/2015 6:36:34 PM      MDM   Final diagnoses:  None    Nathan Miller is a 64 year old male with past medical history of diabetes, coronary disease, hypertension. He presents today from home after being found down and bradycardic.  Exam as above, he is afebrile hemodynamic stable. His neuro exam is unremarkable. We'll obtain labs, including CBC, BMP, troponin.  Will obtain head CT, plain film of the right foot as he is an ulcer on the bottom of foot which is being treated for osteomyelitis for.  ekg shows third degree heart block.  Cards consulted for this and they will plan to place PPM during this admission.  Labs WNL, electrolytes unremarkable.  At this time, unsure if these are syncopal episodes vs. Seizure.  Feel that they are most likely to be syncopal given that he is in heart block making hypoperfusion the most likely etiology for these short losses of consciousness.   Feel that the myoclonic jerks he is having during them are more likely to be from hypoperfusion rather than seizure activity.  His neuro exam is unremarkable as above.  Head CT obtained and unremarkable.  While in the department, the patient had a witnessed syncopal episode during which nursing staff noticed he was in ventricular arrest for approx 5 seconds and then resumed ventricular escape rhythm.  Cards was notified of this and will put in a temp-perm tonight.  No other acute events in the ED      Jarome Matin, MD 01/22/15 0139  Pattricia Boss, MD 01/23/15 2919

## 2015-01-21 NOTE — Consult Note (Addendum)
.    Patient ID: Nathan Miller MRN: [id number], DOB/AGE: 62-Jun-1952   Admit date: 01/21/2015   Primary Physician: Terald Sleeper, MD Primary Cardiologist: Dr. Kirke Corin  Pt. Profile:  64 y/o medically noncompliant male w/ known h/o CAD admitted for syncope and bradycardia.   Problem List  Past Medical History  Diagnosis Date  . Osteomyelitis   . Diabetes mellitus   . CAD (coronary artery disease)     NSTEMI in 2001. Cardiac cath showed: LAD: 40% proximal, LCX: 70% mid, RCA thrombotic occlusion in mid segment. s/p PCI and 2 overlapped BMSs to RCA.   [name] Hypertension     Past Surgical History  Procedure Laterality Date  . Cardiac catheterization    . Coronary angioplasty       Allergies  Allergies  Allergen Reactions  . Penicillins     unknown    HPI  This is a 64 year old gentleman with known CAD. He has been followed in the past by Dr. Kirke Corin. He had non-ST elevation myocardial infarction in 2001. At that time, cardiac catheterization showed an occluded mid RCA with large thrombus. He had an angioplasty in 2 bare-metal stent placements which was complicated by distal embolization into the PL Deisy Ozbun. There was a 70% stenosis in the mid left circumflex and 40% stenosis in the proximal LAD. F/u stress test 02/14/2012 was negative for ischemia. His other medical history is significant for HTN, HLD and diabetes. He also has h/o osteomyelitis of the right foot and underwent multiple bone extractions in the foot ~6 months ago. This is followed by the wound care clinic at [facility]. He has not followed up with Dr. Kirke Corin since 2013. His family reports that he is non compliant regarding medications. He is a former smoker. He drinks, on average 2-3 beers a day.   He presented to the [facility] ED today after being found down at his home by family. Patient was found down in his driveway by his son. Unknown how long he was down.  EMS was called, when they arrived he was confused, had urinated on himself.  Speech was slurred. During the ride to the hospital he gradually became more alert. Per records, he was bradycardic with a HR in the 30s. BG elevated at >400. He was given calcium gluconate and insulin in ER. K is normal at 4.6. Initial EKGs showed CBH. Pacer pads were placed. HR has improved. He is now in 2nd degree mobitz. BP is stable. The patient is now alert and oriented, but family reports noticing brief seizure -like activity earlier while in the ED. CT of head pending. The patient denies any other associated symptoms, including no recent CP, palpitations or CP.   In retrospect, the family recalls that he has been complaining of intermittent dizzy spells over the past 2 months. 2 weeks ago he had a brief syncopal spell that lasted 15-30 seconds. This was witnessed by his wife. He reported feeling hot prior to LOC but denies any other associated symptoms. He did not seek care at that time. No further episodes until today's event.     Home Medications  Prior to Admission medications   Medication Sig Start Date End Date Taking? Authorizing Provider  potassium chloride (K-DUR) 10 MEQ tablet Take 10 mEq by mouth daily.   Yes Historical Provider, MD  atorvastatin (LIPITOR) 10 MG tablet Take 1 tablet (10 mg total) by mouth daily. Patient not taking: Reported on 01/21/2015 02/11/13   Oneal Grout, MD  metoprolol succinate (TOPROL-XL)  12.5 mg TB24 Take 0.5 tablets (12.5 mg total) by mouth 2 (two) times daily. Patient not taking: Reported on 01/21/2015 02/11/13   Oneal Grout, MD    Family History  Family History  Problem Relation Age of Onset  . Hypertension Mother   . Hypertension Father     Social History  History   Social History  . Marital Status: Married    Spouse Name: N/A  . Number of Children: N/A  . Years of Education: N/A   Occupational History  . Not on file.   Social History Main Topics  . Smoking status: Former Smoker -- 0.30 packs/day for 40 years    Types:  Cigarettes  . Smokeless tobacco: Former Neurosurgeon  . Alcohol Use: 3.6 oz/week    6 Cans of beer per week     Comment: beer  . Drug Use: No  . Sexual Activity: Not on file   Other Topics Concern  . Not on file   Social History Narrative     Review of Systems General:  No chills, fever, night sweats or weight changes.  Cardiovascular:  No chest pain, dyspnea on exertion, edema, orthopnea, palpitations, paroxysmal nocturnal dyspnea. Dermatological: No rash, lesions/masses Respiratory: No cough, dyspnea Urologic: No hematuria, dysuria Abdominal:   No nausea, vomiting, diarrhea, bright red blood per rectum, melena, or hematemesis Neurologic:  No visual changes, wkns, changes in mental status. All other systems reviewed and are otherwise negative except as noted above.  Physical Exam  Blood pressure 160/64, pulse 36, temperature 98.2 F (36.8 C), temperature source Oral, resp. rate 26, SpO2 94 %.  General: Pleasant, NAD, obsese Psych: Normal affect. Neuro: Alert and oriented X 3. Moves all extremities spontaneously. speech a bit slurred. HEENT: Normal  Neck: Supple without bruits or JVD. Lungs:  Resp regular and unlabored, CTA. Heart: RRR no s3, s4, or murmurs. Abdomen: Soft, non-tender, non-distended, BS + x 4.  Extremities: Deformity of right foot 2/2 h/o osteomyelitis/ surgery. DP/PT/Radials 2+ and equal bilaterally.  Labs  Troponin [facility] of Care Test)  Recent Labs  01/21/15 1837  TROPIPOC 0.04    Recent Labs  01/21/15 1825  CKTOTAL 67   Lab Results  Component Value Date   WBC 10.4 01/21/2015   HGB 18.4* 01/21/2015   HCT 54.0* 01/21/2015   MCV 97.5 01/21/2015   PLT 207 01/21/2015    Recent Labs Lab 01/21/15 1825 01/21/15 1843  NA 137 138  K 4.4 4.6  CL 103 101  CO2 19  --   BUN 9 12  CREATININE 1.12 1.00  CALCIUM 8.5  --   PROT 6.6  --   BILITOT 0.4  --   ALKPHOS 84  --   ALT 52  --   AST 65*  --   GLUCOSE 417* 430*   No results found for: CHOL,  HDL, LDLCALC, TRIG No results found for: DDIMER   Radiology/Studies  No results found.  ECG  Initial EKG show CHB. Telemetry now shows 2nd degree HB  ASSESSMENT AND PLAN  See plan by Dr. Wyline Mood below.    Beau Fanny, PA-C 01/21/2015, 7:44 PM Patient seen and discussed with PA Sharol Harness, I agree with her documentation. 64 yo male hx of CAD with prior inferior MI in 2001 s/p BMS x 2 to RCA, HTN admitted after being found down in his driveway. Unclear how long he was down according to notes. EMS arrived, found confused but with pulse. Noted to be bradycardic in 30s but  normal blood pressure. From discussions with ER staff noted to have some seizure like activity in ER, he is awaiting CT scan. He reports multiple episodes of syncope over the last few months   01/2012 MPI: small inferior and inferoseptal infarct, no ischemia No echo in system, LVEF by LV gram in 2001 61% Mg 2.4, K 4.6, Cr 1, Hgb 16.5, Plt 207, trop negative EKG 3rd degree AV block with junctional escap with RBBB vs ventricular escape rhythm   Patient admitted after being found down and confused in his driveway. Bradycardic in 30s with EKG showing complete heart block with stable escape rhythm in the mid 30s, he is actually hypertensive. Most recent telemetry shows second degree type II av block with rates improved to the 70s. Unclear if bradycardia is isolated pathology as he was noted to have some seizure activity in the ER. Follow up CT head as intracranial process could causes both seizures and bradycardia. Seizure workup per primary team. With stable blood pressures no indication for temporary pacing at this time. Would place pads in case needed and admit to ICU/stepdown bed for close monitoring. Plan to have EP see in AM. Continue to cycle cardiac enzymes, will add TSH to AM labs. Appears he was on Toprol XL at home, hold and follow rhythms.Would hold all antihypertensives. If issues overnight please contact  oncall cards. Obtain echo in AM.   Dominga Ferry MD

## 2015-01-21 NOTE — H&P (Addendum)
[facility] History and Physical  Nathan Miller:[id number] DOB: September 16, 1951 DOA: 01/21/2015  Referring physician: ER physician. PCP: Terald Sleeper, MD   Chief Complaint: Loss of consciousness.  History obtained from patient's wife and son and daughter.  HPI: Nathan Miller is a 64 y.o. male with history of CAD status post stenting, diabetes mellitus type 2, chronic wound of the right foot, hypertension who has not been taking any medications and has not visited her doctor for a long time was brought to the ER after patient was found to be unconscious in his garage. As per patient's son patient was supposed to pick patient's grandson from school and sensation was not found patient's son went to check on him and was found to be unconscious in his garage. On verbal stimuli patient regained his consciousness spontaneously. Patient also had incontinence of urine. Patient does not recall the incident leading to episode. As per patient's wife patient had episode of loss of consciousness 2 weeks ago and also 2 months ago. 2 weeks ago patient had sudden loss of consciousness following which patient had a jerking spell like a seizure. At that time patient did not have any incontinence of urine but was confused. Patient refused to come to the hospital at that time. Patient did not have any chest pain shortness of breath nausea vomiting or abdominal pain or diarrhea. Patient was found to be bradycardic and in the ER EKG shows complete heart block and cardiology was consulted. Since patient had jerking spells concerning for seizures cardiology also wanted to make sure patient did not have any seizure-like episode and further workup on seizure along with patient's complete heart block. CT head did not show anything acute and at this time MRI brain is pending. On exam patient is nonfocal. Patient has right foot chronic ulcer.   Review of Systems: As presented in the history of presenting illness, rest  negative.  Past Medical History  Diagnosis Date  . Osteomyelitis   . Diabetes mellitus   . CAD (coronary artery disease)     NSTEMI in 2001. Cardiac cath showed: LAD: 40% proximal, LCX: 70% mid, RCA thrombotic occlusion in mid segment. s/p PCI and 2 overlapped BMSs to RCA.   [name] Hypertension    Past Surgical History  Procedure Laterality Date  . Cardiac catheterization    . Coronary angioplasty     Social History:  reports that he has quit smoking. His smoking use included Cigarettes. He has a 12 pack-year smoking history. He has quit using smokeless tobacco. He reports that he drinks about 3.6 oz of alcohol per week. He reports that he does not use illicit drugs. Where does patient live home. Can patient participate in ADLs? Yes.  Allergies  Allergen Reactions  . Penicillins     unknown    Family History:  Family History  Problem Relation Age of Onset  . Hypertension Mother   . Hypertension Father       Prior to Admission medications   Medication Sig Start Date End Date Taking? Authorizing Provider  potassium chloride (K-DUR) 10 MEQ tablet Take 10 mEq by mouth daily.   Yes Historical Provider, MD  atorvastatin (LIPITOR) 10 MG tablet Take 1 tablet (10 mg total) by mouth daily. Patient not taking: Reported on 01/21/2015 02/11/13   Oneal Grout, MD  metoprolol succinate (TOPROL-XL) 12.5 mg TB24 Take 0.5 tablets (12.5 mg total) by mouth 2 (two) times daily. Patient not taking: Reported on 01/21/2015 02/11/13   Mahima  Glade Lloyd, MD    Physical Exam: Filed Vitals:   01/21/15 2115 01/21/15 2122 01/21/15 2145 01/21/15 2200  BP: 161/80  154/73 154/72  Pulse: 92     Temp:  97.8 F (36.6 C)    TempSrc:      Resp: 26  25 20   SpO2: 97%        General:  Moderately labeled and nourished.  Eyes: Anicteric no pallor.  ENT: No discharge from the ears eyes nose and mouth.  Neck: No mass felt.  Cardiovascular: S1 and S2 heard.  Respiratory: No rhonchi or crepitations.  Abdomen:  Soft nontender bowel sounds present.  Skin: Right foot ucer. No active discharge.  Musculoskeletal: Right foot ulcer.  Psychiatric: Appears normal.  Neurologic: Alert awake oriented to time place and person. Moves all extremities. No facial asymmetry. Tongue is midline.  Labs on Admission:  Basic Metabolic Panel:  Recent Labs Lab 01/21/15 1825 01/21/15 1843  NA 137 138  K 4.4 4.6  CL 103 101  CO2 19  --   GLUCOSE 417* 430*  BUN 9 12  CREATININE 1.12 1.00  CALCIUM 8.5  --   MG 2.4  --    Liver Function Tests:  Recent Labs Lab 01/21/15 1825  AST 65*  ALT 52  ALKPHOS 84  BILITOT 0.4  PROT 6.6  ALBUMIN 3.2*   No results for input(s): LIPASE, AMYLASE in the last 168 hours. No results for input(s): AMMONIA in the last 168 hours. CBC:  Recent Labs Lab 01/21/15 1825 01/21/15 1843  WBC 10.4  --   NEUTROABS 8.2*  --   HGB 16.5 18.4*  HCT 50.6 54.0*  MCV 97.5  --   PLT 207  --    Cardiac Enzymes:  Recent Labs Lab 01/21/15 1825  CKTOTAL 67    BNP (last 3 results) No results for input(s): BNP in the last 8760 hours.  ProBNP (last 3 results) No results for input(s): PROBNP in the last 8760 hours.  CBG:  Recent Labs Lab 01/21/15 1838 01/21/15 2104 01/21/15 2143  GLUCAP 390* 330* 264*    Radiological Exams on Admission: Ct Head Wo Contrast  01/21/2015   CLINICAL DATA:  Found on floor, with incontinence. Question of seizure. Bradycardia. Initial encounter.  EXAM: CT HEAD WITHOUT CONTRAST  TECHNIQUE: Contiguous axial images were obtained from the base of the skull through the vertex without intravenous contrast.  COMPARISON:  None.  FINDINGS: There is no evidence of acute infarction, mass lesion, or intra- or extra-axial hemorrhage on CT.  Prominence of the ventricles and sulci suggests mild cortical loss. Cerebellar atrophy is noted. Mild periventricular and subcortical white matter change likely reflects small vessel ischemic microangiopathy. A small  chronic lacunar infarct is seen at the right mid corona radiata. A small chronic lacunar infarct is also seen at the right basal ganglia.  The brainstem and fourth ventricle are within normal limits. The cerebral hemispheres demonstrate grossly normal gray-white differentiation. No mass effect or midline shift is seen.  There is no evidence of fracture; visualized osseous structures are unremarkable in appearance. The visualized portions of the orbits are within normal limits. The paranasal sinuses and mastoid air cells are well-aerated. No significant soft tissue abnormalities are seen.  IMPRESSION: 1. No acute intracranial pathology seen on CT. 2. Mild cortical volume loss and scattered small vessel ischemic microangiopathy. 3. 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   Electronically Signed   By: Leotis Shames  Chang M.D.   On: 01/21/2015 20:24   Dg Foot Complete Right  01/21/2015   CLINICAL DATA:  Known diabetic ulcer on foot with pain  EXAM: RIGHT FOOT COMPLETE - 3+ VIEW  COMPARISON:  02/07/2011  FINDINGS: Significant bony changes are noted consistent with the given clinical history with diabetic foot and multiple Charcot joints. These changes have progressed in the interval from the prior exam. No definitive bony erosion to suggest osteomyelitis is noted.  IMPRESSION: Severe changes in the ankle and foot secondary to diabetic neuropathy and Charcot joint. No definitive erosion to suggest osteomyelitis is noted. MR would be more sensitive as clinically indicated.   Electronically Signed   By: Alcide Clever M.D.   On: 01/21/2015 20:42    EKG: Independently reviewed. Complete heart block.  Assessment/Plan Principal Problem:   Complete heart block Active Problems:   CAD (coronary artery disease)   Bradycardia   Syncope   Right foot ulcer   Diabetes mellitus type 2, uncontrolled   1. Complete heart block - appreciate cardiology consult. At this time we will  admit patient to stepdown and closely observed. Check cardiac markers TSH and 2-D echo. Further recommendations per cardiology. 2. Syncope - most likely secondary to #1. Since patient had seizure-like activities we will check MRI brain and EEG. 3. CAD status post stenting - denies any chest pain. Cycle cardiac markers. Aspirin. 4. Diabetes mellitus type 2 uncontrolled - check hemoglobin A1c. Patient did receive NovoLog in the ER. I have placed patient on Lantus 5 units subcutaneous at bedtime. Closely follow CBGs. 5. Right foot ulcer chronic - check sedimentation rate and if elevated may need MRI to rule out osteomyelitis. I have consulted wound team.  Addendum - patient had a brief episode of pause for 15 seconds during which patient had jerking spells like a seizure. I have discussed with Dr. Herbie Baltimore cardiologist on-call. Dr. Herbie Baltimore will be seeing patient immediately. We will hold off on MRI brain for now. Further recommendations per cardiologist.   DVT Prophylaxis Lovenox.  Code Status: Full code.  Family Communication: Patient's wife daughter and son.  Disposition Plan: Admit to inpatient.    Jabron Weese N. [facility] Pager [phone].  If 7PM-7AM, please contact night-coverage [email or web address] Password [email or web address] 01/21/2015, 10:14 PM

## 2015-01-21 NOTE — ED Notes (Signed)
Per EMS- pt was last seen by family at [place]. Pt was found outside in the driveway with abrasions to rt arm. Pt was reported to be postictal. Pt was found to have a HR of 32. Pt began more responsive during transport. Pt received 1063ml NS en route.

## 2015-01-21 NOTE — ED Notes (Signed)
Called MRI to inquire about time till test. Personnel states that it will be approximately 30 mins before transport.

## 2015-01-21 NOTE — ED Notes (Signed)
Per Agricultural consultant, Cath lab is ready for patient. Cardiology MDs are still at bedside currently discussing procedure with patient.

## 2015-01-21 NOTE — ED Notes (Signed)
Cardiology MD at bedside and has elected to start transcutneous pacing via [place]. Patient was advised during process that he will be taken to cath lab tonight to have temporary pacemaker placed to maintain perfusion.

## 2015-01-21 NOTE — ED Notes (Signed)
cbg [id number]

## 2015-01-21 NOTE — CV Procedure (Addendum)
    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 The patient and/or family concurred with the proposed plan, giving informed consent. Consent was not given due to emergent medical necessity.  He & his family voiced verbal understanding.  Procedure Details:  After procedural and radiation safety time-outs were performed, the patient was prepped and draped in the usual sterile fashion.   Access:   Right Common Femoral Vein: 6 Fr Sheath - fluoroscopic guidance Modified Seldinger technique.   through the  5Fr Temporary pacemaker wire was then advanced under fluoroscopic guidance with balloon inflated into the Right Ventricle until electrical capture was noted.   The position of the wire depth was noted and the wire locked into place with a sterile wire cover.   Pacemaker settings were adjusted to allow the minimal pacing necessary to capture the ventricle. -- 72mAmp (threshold ~1 mAmp); RATE 50 bpm  The sheath was then sutured into place and the pacemaker wire was secured.   62 mm  Estimated Blood Loss:  Minimal       Complications:  None; patient tolerated the procedure well.  CXR: Post-operative chest x-ray shows adequate position of the pacemaker wire which is visualized extending into the apex of the right ventricle.  No pneumothorax was noted.         Disposition/Condition: ICU - extubated and stable.         Leonie Man, M.D., M.S. Interventional  Cardiologist   Pager # [phone]  01/21/2015,11:59 PM

## 2015-01-21 NOTE — H&P (Addendum)
INTERVENTIONAL CARDIOLOGY CONSULTATION NOTE  NAME:  Nathan Miller   MRN: [id number] DOB:  [birth year]   ADMIT DATE: 01/21/2015 PRIMARY CARDIOLOGIST: Dr. Kirke Corin  01/21/2015 11:25 PM Nathan Miller is a 64 y.o. male with PMH of NSTEMI in 2001 - mRCA 100%. He had an angioplasty in 2 bare-metal stent placements which was complicated by distal embolization into the PL branch. There was a 70% stenosis in the mid left circumflex and 40% stenosis in the proximal LAD. Was seen by Dr. Wyline Mood this PM @ [facility] ER - Was found down in driveway for unclear duration.  In ER was noted to be brady in 30s - with stable BP.   Was in 3rd Deg AVB.  Previous EKG - Mobitz II 2nd Deg AVB. Apparently has had multiple syncopal events in the last few months. Initial plan was to evaluate for seizure activity. While in the ER - he had 2 additional episodes of 3rd Deg AVB lasting several seconds (1 was ~11 sec) - both with seizure-like activity -suggesting that the 2 correlate. He reports that he has not been taking Metoprolol as listed.  On my evaluation, he was in 2:1 AVB.  Temp Pacing with pads - backup rate 50 set while determining course of action. CATH Lab team is in for possible STEMI that is being cancelled.   Due to 2 episodes in ER following the earlier episode today - safest to place Temp PM for backup pacing if high grade AVB recurs.   Past Medical History  Diagnosis Date  . Osteomyelitis   . Diabetes mellitus   . CAD (coronary artery disease)     NSTEMI in 2001. Cardiac cath showed: LAD: 40% proximal, LCX: 70% mid, RCA thrombotic occlusion in mid segment. s/p PCI and 2 overlapped BMSs to RCA.   [name] Hypertension    Past Surgical History  Procedure Laterality Date  . Cardiac catheterization    . Coronary angioplasty     01/2012 MPI: small inferior and inferoseptal infarct, no ischemia  No echo in system, LVEF by LV gram in 2001 61%  FAMHx: Family History  Problem Relation Age of Onset  . Hypertension  Mother   . Hypertension Father     SOCHx:  reports that he has quit smoking. His smoking use included Cigarettes. He has a 12 pack-year smoking history. He has quit using smokeless tobacco. He reports that he drinks about 3.6 oz of alcohol per week. He reports that he does not use illicit drugs.  ALLERGIES: Allergies  Allergen Reactions  . Penicillins     unknown    HOME MEDICATIONS: Prescriptions prior to admission  Medication Sig Dispense Refill Last Dose  . potassium chloride (K-DUR) 10 MEQ tablet Take 10 mEq by mouth daily.   01/21/2015 at Unknown time  . atorvastatin (LIPITOR) 10 MG tablet Take 1 tablet (10 mg total) by mouth daily. (Patient not taking: Reported on 01/21/2015) 30 tablet 3 Not Taking at Unknown time  . metoprolol succinate (TOPROL-XL) 12.5 mg TB24 Take 0.5 tablets (12.5 mg total) by mouth 2 (two) times daily. (Patient not taking: Reported on 01/21/2015) 30 tablet 3 Not Taking at Unknown time    PHYSICAL EXAM:Blood pressure 139/74, pulse 70, temperature 97.8 F (36.6 C), temperature source Oral, resp. rate 13, SpO2 98 %. General appearance: alert, cooperative, appears stated age, mild distress, morbidly obese and pleasant mood & affect Neck: no adenopathy, no carotid bruit, no JVD and supple, symmetrical, trachea midline Lungs: clear to  auscultation bilaterally and normal percussion bilaterally Heart: Irregular HR - frequent ectopy. No M/R/G Abdomen: soft, non-tender; bowel sounds normal; no masses,  no organomegaly and obese Extremities: R foot deformity (s/p crush injury) - has some edema & calloused healed ulcer on external portion of foot.  Otherwise no C/C/E Pulses: 2+ and symmetric Neurologic: Alert and oriented X 3, normal strength and tone. Normal symmetric reflexes. Normal coordination and gait   Adult ECG Report  Rate: 31 ;  Rhythm: normal sinus rhythm and with 3rd Deg AVG & Ventricular Escape Rhythm  IMPRESSION & PLAN The patients' history has been  reviewed, patient examined. I have reviewed the patients' chart and labs. Questions were answered to the patient's satisfaction.    Nathan Miller has presented today for Temporary Transvenous Pacemaker Placement, with the diagnosis of Complete Heart Block. The various methods of treatment have been discussed with the patient and family.   Risks / Complications include, but not limited to: Death, MI, CVA/TIA, VF/VT (with defibrillation), Bradycardia (need for temporary pacer placement), contrast induced nephropathy , bleeding / bruising / hematoma / pseudoaneurysm, vascular or coronary injury (with possible emergent CT or Vascular Surgery), adverse medication reactions, infection.     After consideration of risks, benefits and other options for treatment, the patient has consented to Procedure(s):  TEMPORARY PACEMAKER PLACEMENT  as a surgical intervention.   We will proceed with the planned procedure.   Cyanna Neace [facility] [address] [place], [place]  [address]  [phone]  01/21/2015 11:25 PM

## 2015-01-21 NOTE — ED Notes (Signed)
Patient had another episode of LOC. This RN was observing monitor when patient's rhythm became asystole. P-waves remained, but no QRS observed for about 15s. Rushed to patient's bedside. Patient found to be unconscious with tremors and deviated gaze. Quickly recovered and became A+O x4 once again. Patient had no recollection of incident.

## 2015-01-21 NOTE — ED Provider Notes (Signed)
64 year old male with multiple health problems who has had several episodes of passing out with some shaking over the past several weeks. Here he is noted to be in third-degree heart block. He is hyperglycemic and has been treated. He has had several episodes here of short tonic-clonic episodes. The last episode was witnessed by nursing with a positive noted on the monitor. Cardiology was consulted initially, then consulted again after the pause. They plan to take the patient to the Cath Lab for pacemaker placement.  I saw and evaluated the patient, reviewed the resident's note and I agree with the findings and plan.   EKG Interpretation   Date/Time:  Thursday January 21 2015 19:17:07 EDT Ventricular Rate:  31 PR Interval:  54 QRS Duration: 145 QT Interval:  695 QTC Calculation: 499 R Axis:   -90 Text Interpretation:  Third degree heart block Wide QRS rhythm Confirmed  by Livia Tarr MD, Andee Poles [phone]) on 01/21/2015 7:48:00 PM      CRITICAL CARE Performed by: Shaune Pollack Total critical care time: 60 Critical care time was exclusive of separately billable procedures and treating other patients. Critical care was necessary to treat or prevent imminent or life-threatening deterioration. Critical care was time spent personally by me on the following activities: development of treatment plan with patient and/or surrogate as well as nursing, discussions with consultants, evaluation of patient's response to treatment, examination of patient, obtaining history from patient or surrogate, ordering and performing treatments and interventions, ordering and review of laboratory studies, ordering and review of radiographic studies, pulse oximetry and re-evaluation of patient's condition.   Pattricia Boss, MD 01/21/15 2252

## 2015-01-22 ENCOUNTER — Encounter (HOSPITAL_COMMUNITY)
Admission: EM | Disposition: A | Discharge status: Home / Self Care | Payer: Self-pay | Source: Home / Self Care | Attending: Internal Medicine

## 2015-01-22 ENCOUNTER — Encounter (HOSPITAL_COMMUNITY): Payer: Self-pay | Admitting: Cardiology

## 2015-01-22 ENCOUNTER — Inpatient Hospital Stay (HOSPITAL_COMMUNITY): Payer: Medicare Other

## 2015-01-22 DIAGNOSIS — R55 Syncope and collapse: Secondary | ICD-10-CM

## 2015-01-22 DIAGNOSIS — I441 Atrioventricular block, second degree: Secondary | ICD-10-CM | POA: Diagnosis present

## 2015-01-22 DIAGNOSIS — I459 Conduction disorder, unspecified: Secondary | ICD-10-CM

## 2015-01-22 DIAGNOSIS — I442 Atrioventricular block, complete: Secondary | ICD-10-CM

## 2015-01-22 HISTORY — PX: PERMANENT PACEMAKER INSERTION: SHX5480

## 2015-01-22 LAB — CBC WITH DIFFERENTIAL/PLATELET
Basophils Absolute: 0 10*3/uL (ref 0.0–0.1)
Basophils Relative: 0 % (ref 0–1)
Eosinophils Absolute: 0 10*3/uL (ref 0.0–0.7)
Eosinophils Relative: 1 % (ref 0–5)
Lymphocytes Relative: 29 % (ref 12–46)
Lymphs Abs: 2.3 10*3/uL (ref 0.7–4.0)
Monocytes Absolute: 0.7 10*3/uL (ref 0.1–1.0)
Monocytes Relative: 9 % (ref 3–12)
Neutro Abs: 4.9 10*3/uL (ref 1.7–7.7)
Neutrophils Relative %: 61 % (ref 43–77)

## 2015-01-22 LAB — GLUCOSE, CAPILLARY
Glucose-Capillary: 188 mg/dL — ABNORMAL HIGH (ref 70–99)
Glucose-Capillary: 192 mg/dL — ABNORMAL HIGH (ref 70–99)
Glucose-Capillary: 165 mg/dL — ABNORMAL HIGH (ref 70–99)

## 2015-01-22 LAB — CBC
HCT: 44.5 % (ref 39.0–52.0)
Hemoglobin: 14.5 g/dL (ref 13.0–17.0)
MCH: 31.7 pg (ref 26.0–34.0)
MCHC: 32.6 g/dL (ref 30.0–36.0)
MCV: 97.2 fL (ref 78.0–100.0)
Platelets: 172 10*3/uL (ref 150–400)
RBC: 4.58 MIL/uL (ref 4.22–5.81)
RDW: 13.4 % (ref 11.5–15.5)
WBC: 8.1 10*3/uL (ref 4.0–10.5)

## 2015-01-22 LAB — COMPREHENSIVE METABOLIC PANEL
ALT: 45 U/L (ref 0–53)
AST: 56 U/L — ABNORMAL HIGH (ref 0–37)
Albumin: 2.8 g/dL — ABNORMAL LOW (ref 3.5–5.2)
Alkaline Phosphatase: 72 U/L (ref 39–117)
Anion gap: 9 (ref 5–15)
BUN: 9 mg/dL (ref 6–23)
CO2: 23 mmol/L (ref 19–32)
Calcium: 7.6 mg/dL — ABNORMAL LOW (ref 8.4–10.5)
Chloride: 105 mmol/L (ref 96–112)
Creatinine, Ser: 0.88 mg/dL (ref 0.50–1.35)
GFR calc Af Amer: >90 mL/min (ref >90)
GFR calc non Af Amer: >90 mL/min (ref >90)
Glucose, Bld: 199 mg/dL — ABNORMAL HIGH (ref 70–99)
Potassium: 4.5 mmol/L (ref 3.5–5.1)
Sodium: 137 mmol/L (ref 135–145)
Total Bilirubin: 1.1 mg/dL (ref 0.3–1.2)
Total Protein: 5.5 g/dL — ABNORMAL LOW (ref 6.0–8.3)

## 2015-01-22 LAB — TROPONIN I
Troponin I: 0.09 ng/mL — ABNORMAL HIGH (ref <0.031)
Troponin I: 0.05 ng/mL — ABNORMAL HIGH (ref <0.031)

## 2015-01-22 LAB — SEDIMENTATION RATE: Sed Rate: 2 mm/hr (ref 0–16)

## 2015-01-22 LAB — TSH: TSH: 2.244 u[IU]/mL (ref 0.350–4.500)

## 2015-01-22 LAB — CREATININE, SERUM
Creatinine, Ser: 0.85 mg/dL (ref 0.50–1.35)
GFR calc Af Amer: >90 mL/min (ref >90)
GFR calc non Af Amer: >90 mL/min (ref >90)

## 2015-01-22 LAB — MRSA PCR SCREENING: MRSA by PCR: POSITIVE — AB

## 2015-01-22 SURGERY — PERMANENT PACEMAKER INSERTION
Anesthesia: LOCAL

## 2015-01-22 MED ORDER — ASPIRIN 325 MG PO TABS
325.0000 mg | ORAL_TABLET | Freq: Every day | ORAL | Status: DC
  Administered 2015-01-22: 325 mg via ORAL
  Filled 2015-01-22: qty 1

## 2015-01-22 MED ORDER — INSULIN ASPART 100 UNIT/ML ~~LOC~~ SOLN
0.0000 [IU] | Freq: Three times a day (TID) | SUBCUTANEOUS | Status: DC
  Administered 2015-01-22 – 2015-01-23 (×2): 3 [IU] via SUBCUTANEOUS
  Administered 2015-01-23: 8 [IU] via SUBCUTANEOUS
  Administered 2015-01-24 (×3): 3 [IU] via SUBCUTANEOUS
  Administered 2015-01-25 (×2): 2 [IU] via SUBCUTANEOUS

## 2015-01-22 MED ORDER — PERFLUTREN LIPID MICROSPHERE
1.0000 mL | INTRAVENOUS | Status: AC | PRN
  Administered 2015-01-22: 4 mL via INTRAVENOUS
  Filled 2015-01-22: qty 10

## 2015-01-22 MED ORDER — SODIUM CHLORIDE 0.9 % IJ SOLN
3.0000 mL | Freq: Two times a day (BID) | INTRAMUSCULAR | Status: DC
  Administered 2015-01-22 – 2015-01-25 (×6): 3 mL via INTRAVENOUS

## 2015-01-22 MED ORDER — INSULIN ASPART 100 UNIT/ML ~~LOC~~ SOLN: 0.0000 [IU] | Freq: Every day | SUBCUTANEOUS | Status: DC

## 2015-01-22 MED ORDER — SODIUM CHLORIDE 0.9 % IV SOLN: 250.0000 mL | INTRAVENOUS | Status: DC | PRN

## 2015-01-22 MED ORDER — HYDRALAZINE HCL 20 MG/ML IJ SOLN
10.0000 mg | INTRAMUSCULAR | Status: DC | PRN
  Administered 2015-01-22: 10 mg via INTRAVENOUS
  Filled 2015-01-22: qty 1

## 2015-01-22 MED ORDER — SODIUM CHLORIDE 0.9 % IV SOLN: INTRAVENOUS | Status: DC

## 2015-01-22 MED ORDER — VITAMIN B-1 100 MG PO TABS
100.0000 mg | ORAL_TABLET | Freq: Every day | ORAL | Status: DC
  Administered 2015-01-22 – 2015-01-25 (×4): 100 mg via ORAL
  Filled 2015-01-22 (×4): qty 1

## 2015-01-22 MED ORDER — LORAZEPAM 2 MG/ML IJ SOLN: 2.0000 mg | INTRAMUSCULAR | Status: DC | PRN

## 2015-01-22 MED ORDER — SODIUM CHLORIDE 0.9 % IJ SOLN: 3.0000 mL | INTRAMUSCULAR | Status: DC | PRN

## 2015-01-22 MED ORDER — HYDROCODONE-ACETAMINOPHEN 5-325 MG PO TABS
1.0000 | ORAL_TABLET | ORAL | Status: DC | PRN
  Administered 2015-01-23 – 2015-01-24 (×2): 2 via ORAL
  Administered 2015-01-24: 1 via ORAL
  Filled 2015-01-22 (×2): qty 2
  Filled 2015-01-22: qty 1

## 2015-01-22 MED ORDER — FOLIC ACID 5 MG/ML IJ SOLN
1.0000 mg | Freq: Every day | INTRAMUSCULAR | Status: DC
  Filled 2015-01-22: qty 0.2

## 2015-01-22 MED ORDER — SODIUM CHLORIDE 0.9 % IV SOLN
INTRAVENOUS | Status: DC
  Administered 2015-01-22: 01:00:00 via INTRAVENOUS

## 2015-01-22 MED ORDER — FENTANYL CITRATE 0.05 MG/ML IJ SOLN
INTRAMUSCULAR | Status: AC
  Filled 2015-01-22: qty 2

## 2015-01-22 MED ORDER — VANCOMYCIN HCL 10 G IV SOLR
1500.0000 mg | INTRAVENOUS | Status: DC
  Filled 2015-01-22 (×2): qty 1500

## 2015-01-22 MED ORDER — CHLORHEXIDINE GLUCONATE CLOTH 2 % EX PADS
6.0000 | MEDICATED_PAD | Freq: Every day | CUTANEOUS | Status: DC
  Administered 2015-01-23 – 2015-01-25 (×3): 6 via TOPICAL

## 2015-01-22 MED ORDER — SODIUM CHLORIDE 0.9 % IR SOLN
80.0000 mg | Status: DC
  Filled 2015-01-22: qty 2

## 2015-01-22 MED ORDER — CHLORHEXIDINE GLUCONATE 4 % EX LIQD
60.0000 mL | Freq: Once | CUTANEOUS | Status: AC
  Administered 2015-01-22: 4 via TOPICAL
  Filled 2015-01-22: qty 120

## 2015-01-22 MED ORDER — SENNOSIDES-DOCUSATE SODIUM 8.6-50 MG PO TABS
1.0000 | ORAL_TABLET | Freq: Every evening | ORAL | Status: DC | PRN
  Administered 2015-01-24: 1 via ORAL
  Filled 2015-01-22 (×2): qty 1

## 2015-01-22 MED ORDER — LIDOCAINE HCL (PF) 1 % IJ SOLN
INTRAMUSCULAR | Status: AC
  Filled 2015-01-22: qty 30

## 2015-01-22 MED ORDER — PERFLUTREN LIPID MICROSPHERE
INTRAVENOUS | Status: AC
  Filled 2015-01-22: qty 10

## 2015-01-22 MED ORDER — ADULT MULTIVITAMIN W/MINERALS CH
1.0000 | ORAL_TABLET | Freq: Every day | ORAL | Status: DC
  Administered 2015-01-22 – 2015-01-25 (×4): 1 via ORAL
  Filled 2015-01-22 (×4): qty 1

## 2015-01-22 MED ORDER — ONDANSETRON HCL 4 MG/2ML IJ SOLN: 4.0000 mg | Freq: Four times a day (QID) | INTRAMUSCULAR | Status: DC | PRN

## 2015-01-22 MED ORDER — INSULIN GLARGINE 100 UNIT/ML ~~LOC~~ SOLN
10.0000 [IU] | Freq: Every day | SUBCUTANEOUS | Status: DC
  Administered 2015-01-23: 10 [IU] via SUBCUTANEOUS
  Filled 2015-01-22 (×3): qty 0.1

## 2015-01-22 MED ORDER — FOLIC ACID 5 MG/ML IJ SOLN
1.0000 mg | Freq: Every day | INTRAMUSCULAR | Status: DC
  Filled 2015-01-22 (×4): qty 0.2

## 2015-01-22 MED ORDER — CHLORHEXIDINE GLUCONATE 4 % EX LIQD
60.0000 mL | Freq: Once | CUTANEOUS | Status: DC
Start: 2015-01-22 — End: 2015-01-22
  Administered 2015-01-22: 4 via TOPICAL

## 2015-01-22 MED ORDER — LIDOCAINE HCL (PF) 1 % IJ SOLN
INTRAMUSCULAR | Status: AC
  Filled 2015-01-22: qty 60

## 2015-01-22 MED ORDER — AMLODIPINE BESYLATE 10 MG PO TABS
10.0000 mg | ORAL_TABLET | Freq: Every day | ORAL | Status: DC
  Administered 2015-01-22 – 2015-01-24 (×3): 10 mg via ORAL
  Filled 2015-01-22 (×3): qty 1

## 2015-01-22 MED ORDER — VANCOMYCIN HCL IN DEXTROSE 1-5 GM/200ML-% IV SOLN
1000.0000 mg | Freq: Two times a day (BID) | INTRAVENOUS | Status: AC
  Administered 2015-01-23: 1000 mg via INTRAVENOUS
  Filled 2015-01-22: qty 200

## 2015-01-22 MED ORDER — ACETAMINOPHEN 325 MG PO TABS: 325.0000 mg | ORAL_TABLET | ORAL | Status: DC | PRN

## 2015-01-22 MED ORDER — INSULIN GLARGINE 100 UNIT/ML ~~LOC~~ SOLN
5.0000 [IU] | Freq: Every day | SUBCUTANEOUS | Status: DC
  Administered 2015-01-22: 5 [IU] via SUBCUTANEOUS
  Filled 2015-01-22 (×2): qty 0.05

## 2015-01-22 MED ORDER — ASPIRIN 300 MG RE SUPP
300.0000 mg | Freq: Every day | RECTAL | Status: DC
  Filled 2015-01-22: qty 1

## 2015-01-22 MED ORDER — ENOXAPARIN SODIUM 40 MG/0.4ML ~~LOC~~ SOLN
40.0000 mg | SUBCUTANEOUS | Status: DC
  Administered 2015-01-22: 40 mg via SUBCUTANEOUS
  Filled 2015-01-22: qty 0.4

## 2015-01-22 MED ORDER — THIAMINE HCL 100 MG/ML IJ SOLN: 100.0000 mg | Freq: Every day | INTRAMUSCULAR | Status: DC

## 2015-01-22 MED ORDER — FOLIC ACID 1 MG PO TABS
1.0000 mg | ORAL_TABLET | Freq: Every day | ORAL | Status: DC
  Administered 2015-01-22 – 2015-01-25 (×4): 1 mg via ORAL
  Filled 2015-01-22 (×4): qty 1

## 2015-01-22 MED ORDER — FOLIC ACID 1 MG PO TABS
1.0000 mg | ORAL_TABLET | Freq: Every day | ORAL | Status: DC
  Filled 2015-01-22: qty 1

## 2015-01-22 MED ORDER — MUPIROCIN 2 % EX OINT
1.0000 "application " | TOPICAL_OINTMENT | Freq: Two times a day (BID) | CUTANEOUS | Status: DC
  Administered 2015-01-22 – 2015-01-25 (×8): 1 via NASAL
  Filled 2015-01-22 (×2): qty 22

## 2015-01-22 MED ORDER — INSULIN ASPART 100 UNIT/ML ~~LOC~~ SOLN
0.0000 [IU] | Freq: Three times a day (TID) | SUBCUTANEOUS | Status: DC
  Administered 2015-01-22: 2 [IU] via SUBCUTANEOUS

## 2015-01-22 NOTE — Progress Notes (Signed)
PT Cancellation Note  Patient Details Name: NATANEL SNAVELY MRN: [id number] DOB: 1951-01-23   Cancelled Treatment:    Reason Eval/Treat Not Completed: Patient not medically ready.  Patient with complete heart block with transvenous pacemaker.  Work-up continues - ?perm pacer.  Will hold PT until patient medically ready for therapy.   Despina Pole 01/22/2015, 10:18 AM Carita Pian. Sanjuana Kava, [place] Pager [phone]

## 2015-01-22 NOTE — Progress Notes (Signed)
DAILY PROGRESS NOTE  Subjective:  Events noted yesterday. Temporary wire placed for syncope in the setting of complete heart block. No chest pain complaints. He apparently had a short period of asystole. No events overnight. He seems to not recall that he has a pacer in.  Objective:  Temp:  [97.8 F (36.6 C)-98.3 F (36.8 C)] 97.8 F (36.6 C) (03/25 0700) Pulse Rate:  [32-107] 57 (03/25 0700) Resp:  [13-27] 20 (03/25 0700) BP: (123-177)/(42-97) 175/79 mmHg (03/25 0700) SpO2:  [84 %-100 %] 99 % (03/25 0700) Weight:  [231 lb 4.2 oz (104.9 kg)] 231 lb 4.2 oz (104.9 kg) (03/25 0040) Weight change:   Intake/Output from previous day: 03/24 0701 - 03/25 0700 In: 600 [P.O.:250; I.V.:350] Out: 225 [Urine:225]  Intake/Output from this shift:    Medications: Current Facility-Administered Medications  Medication Dose Route Frequency Provider Last Rate Last Dose  . 0.9 %  sodium chloride infusion   Intravenous Continuous Rise Patience, MD 50 mL/hr at 01/22/15 0046    . aspirin suppository 300 mg  300 mg Rectal Daily Rise Patience, MD       Or  . aspirin tablet 325 mg  325 mg Oral Daily Rise Patience, MD      . Chlorhexidine Gluconate Cloth 2 % PADS 6 each  6 each Topical Q0600 Rise Patience, MD   0 each at 01/22/15 0600  . enoxaparin (LOVENOX) injection 40 mg  40 mg Subcutaneous Q24H Rise Patience, MD      . folic acid (FOLVITE) tablet 1 mg  1 mg Oral Daily Jeryl Columbia, NP      . folic acid injection 1 mg  1 mg Intravenous Daily Jeryl Columbia, NP      . insulin aspart (novoLOG) injection 0-9 Units  0-9 Units Subcutaneous TID WC Rise Patience, MD   2 Units at 01/22/15 [phone]  . insulin glargine (LANTUS) injection 5 Units  5 Units Subcutaneous QHS Rise Patience, MD   5 Units at 01/22/15 0107  . LORazepam (ATIVAN) injection 2-3 mg  2-3 mg Intravenous Q1H PRN Jeryl Columbia, NP      . multivitamin with minerals tablet 1 tablet  1  tablet Oral Daily Rhetta Mura Schorr, NP      . mupirocin ointment (BACTROBAN) 2 % 1 application  1 application Nasal BID Rise Patience, MD   1 application at 25/49/82 0321  . senna-docusate (Senokot-S) tablet 1 tablet  1 tablet Oral QHS PRN Rise Patience, MD      . thiamine (VITAMIN B-1) tablet 100 mg  100 mg Oral Daily Jeryl Columbia, NP        Physical Exam: General appearance: alert and no distress Neck: no carotid bruit and no JVD Lungs: clear to auscultation bilaterally Heart: regular rate and rhythm, S1, S2 normal, no murmur, click, rub or gallop Abdomen: soft, non-tender; bowel sounds normal; no masses,  no organomegaly Extremities: right foot deformed and internally rotated from trauma, there is a lateral skin wound which is dressed Pulses: 2+ and symmetric Skin: Skin color, texture, turgor normal. No rashes or lesions Neurologic: Mental status: Alert, oriented, thought content appropriate Psych: Normal  Lab Results: Results for orders placed or performed during the hospital encounter of 01/21/15 (from the past 48 hour(s))  CBC with Differential     Status: Abnormal   Collection Time: 01/21/15  6:25 PM  Result Value Ref Range   WBC 10.4  4.0 - 10.5 K/uL   RBC 5.19 4.22 - 5.81 MIL/uL   Hemoglobin 16.5 13.0 - 17.0 g/dL   HCT 50.6 39.0 - 52.0 %   MCV 97.5 78.0 - 100.0 fL   MCH 31.8 26.0 - 34.0 pg   MCHC 32.6 30.0 - 36.0 g/dL   RDW 13.2 11.5 - 15.5 %   Platelets 207 150 - 400 K/uL   Neutrophils Relative % 80 (H) 43 - 77 %   Neutro Abs 8.2 (H) 1.7 - 7.7 K/uL   Lymphocytes Relative 11 (L) 12 - 46 %   Lymphs Abs 1.1 0.7 - 4.0 K/uL   Monocytes Relative 9 3 - 12 %   Monocytes Absolute 1.0 0.1 - 1.0 K/uL   Eosinophils Relative 0 0 - 5 %   Eosinophils Absolute 0.0 0.0 - 0.7 K/uL   Basophils Relative 0 0 - 1 %   Basophils Absolute 0.0 0.0 - 0.1 K/uL  Comprehensive metabolic panel     Status: Abnormal   Collection Time: 01/21/15  6:25 PM  Result Value Ref Range     Sodium 137 135 - 145 mmol/L   Potassium 4.4 3.5 - 5.1 mmol/L   Chloride 103 96 - 112 mmol/L   CO2 19 19 - 32 mmol/L   Glucose, Bld 417 (H) 70 - 99 mg/dL   BUN 9 6 - 23 mg/dL   Creatinine, Ser 1.12 0.50 - 1.35 mg/dL   Calcium 8.5 8.4 - 10.5 mg/dL   Total Protein 6.6 6.0 - 8.3 g/dL   Albumin 3.2 (L) 3.5 - 5.2 g/dL   AST 65 (H) 0 - 37 U/L   ALT 52 0 - 53 U/L   Alkaline Phosphatase 84 39 - 117 U/L   Total Bilirubin 0.4 0.3 - 1.2 mg/dL   GFR calc non Af Amer 69 (L) >90 mL/min   GFR calc Af Amer 80 (L) >90 mL/min    Comment: (NOTE) The eGFR has been calculated using the CKD EPI equation. This calculation has not been validated in all clinical situations. eGFR's persistently <90 mL/min signify possible Chronic Kidney Disease.    Anion gap 15 5 - 15  CK     Status: None   Collection Time: 01/21/15  6:25 PM  Result Value Ref Range   Total CK 67 7 - 232 U/L  Magnesium     Status: None   Collection Time: 01/21/15  6:25 PM  Result Value Ref Range   Magnesium 2.4 1.5 - 2.5 mg/dL  I-Stat Troponin, ED (not at [facility])     Status: None   Collection Time: 01/21/15  6:37 PM  Result Value Ref Range   Troponin i, poc 0.04 0.00 - 0.08 ng/mL   Comment 3            Comment: Due to the release kinetics of cTnI, a negative result within the first hours of the onset of symptoms does not rule out myocardial infarction with certainty. If myocardial infarction is still suspected, repeat the test at appropriate intervals.   POC CBG, ED     Status: Abnormal   Collection Time: 01/21/15  6:38 PM  Result Value Ref Range   Glucose-Capillary 390 (H) 70 - 99 mg/dL  I-stat chem 8, ed     Status: Abnormal   Collection Time: 01/21/15  6:43 PM  Result Value Ref Range   Sodium 138 135 - 145 mmol/L   Potassium 4.6 3.5 - 5.1 mmol/L   Chloride 101 96 -  112 mmol/L   BUN 12 6 - 23 mg/dL   Creatinine, Ser 1.00 0.50 - 1.35 mg/dL   Glucose, Bld 430 (H) 70 - 99 mg/dL   Calcium, Ion 1.07 (L) 1.13 - 1.30 mmol/L    TCO2 16 0 - 100 mmol/L   Hemoglobin 18.4 (H) 13.0 - 17.0 g/dL   HCT 54.0 (H) 39.0 - 52.0 %  CBG monitoring, ED     Status: Abnormal   Collection Time: 01/21/15  9:04 PM  Result Value Ref Range   Glucose-Capillary 330 (H) 70 - 99 mg/dL  Urinalysis, Routine w reflex microscopic     Status: Abnormal   Collection Time: 01/21/15  9:07 PM  Result Value Ref Range   Color, Urine YELLOW YELLOW   APPearance CLEAR CLEAR   Specific Gravity, Urine 1.031 (H) 1.005 - 1.030   pH 5.0 5.0 - 8.0   Glucose, UA >1000 (A) NEGATIVE mg/dL   Hgb urine dipstick NEGATIVE NEGATIVE   Bilirubin Urine NEGATIVE NEGATIVE   Ketones, ur 15 (A) NEGATIVE mg/dL   Protein, ur NEGATIVE NEGATIVE mg/dL   Urobilinogen, UA 0.2 0.0 - 1.0 mg/dL   Nitrite NEGATIVE NEGATIVE   Leukocytes, UA NEGATIVE NEGATIVE  Urine microscopic-add on     Status: Abnormal   Collection Time: 01/21/15  9:07 PM  Result Value Ref Range   WBC, UA 0-2 <3 WBC/hpf   RBC / HPF 0-2 <3 RBC/hpf   Bacteria, UA RARE RARE   Casts HYALINE CASTS (A) NEGATIVE   Crystals TRIPLE PHOSPHATE CRYSTALS (A) NEGATIVE    Comment: URIC ACID CRYSTALS  Drug screen panel, emergency     Status: Abnormal   Collection Time: 01/21/15  9:09 PM  Result Value Ref Range   Opiates NONE DETECTED NONE DETECTED   Cocaine NONE DETECTED NONE DETECTED   Benzodiazepines POSITIVE (A) NONE DETECTED   Amphetamines NONE DETECTED NONE DETECTED   Tetrahydrocannabinol POSITIVE (A) NONE DETECTED   Barbiturates NONE DETECTED NONE DETECTED    Comment:        DRUG SCREEN FOR MEDICAL PURPOSES ONLY.  IF CONFIRMATION IS NEEDED FOR ANY PURPOSE, NOTIFY LAB WITHIN 5 DAYS.        LOWEST DETECTABLE LIMITS FOR URINE DRUG SCREEN Drug Class       Cutoff (ng/mL) Amphetamine      1000 Barbiturate      200 Benzodiazepine   601 Tricyclics       093 Opiates          300 Cocaine          300 THC              50   CBG monitoring, ED     Status: Abnormal   Collection Time: 01/21/15  9:43 PM  Result  Value Ref Range   Glucose-Capillary 264 (H) 70 - 99 mg/dL  Ethanol     Status: None   Collection Time: 01/21/15  9:52 PM  Result Value Ref Range   Alcohol, Ethyl (B) <5 0 - 9 mg/dL    Comment:        LOWEST DETECTABLE LIMIT FOR SERUM ALCOHOL IS 11 mg/dL FOR MEDICAL PURPOSES ONLY   TSH     Status: None   Collection Time: 01/21/15  9:52 PM  Result Value Ref Range   TSH 2.244 0.350 - 4.500 uIU/mL  Troponin I (q 6hr x 3)     Status: Abnormal   Collection Time: 01/21/15  9:52 PM  Result Value Ref  Range   Troponin I 0.09 (H) <0.031 ng/mL    Comment:        PERSISTENTLY INCREASED TROPONIN VALUES IN THE RANGE OF 0.04-0.49 ng/mL CAN BE SEEN IN:       -UNSTABLE ANGINA       -CONGESTIVE HEART FAILURE       -MYOCARDITIS       -CHEST TRAUMA       -ARRYHTHMIAS       -LATE PRESENTING MYOCARDIAL INFARCTION       -COPD   CLINICAL FOLLOW-UP RECOMMENDED.   I-Stat Venous Blood Gas, ED (order at [facility] and [facility] only)     Status: Abnormal   Collection Time: 01/21/15 10:04 PM  Result Value Ref Range   pH, Ven 7.333 (H) 7.250 - 7.300   pCO2, Ven 49.1 45.0 - 50.0 mmHg   pO2, Ven 65.0 (H) 30.0 - 45.0 mmHg   Bicarbonate 26.0 (H) 20.0 - 24.0 mEq/L   TCO2 28 0 - 100 mmol/L   O2 Saturation 91.0 %   Acid-base deficit 1.0 0.0 - 2.0 mmol/L   Collection site BRACHIAL ARTERY    Sample type VENOUS   MRSA PCR Screening     Status: Abnormal   Collection Time: 01/22/15 12:30 AM  Result Value Ref Range   MRSA by PCR POSITIVE (A) NEGATIVE    Comment:        The GeneXpert MRSA Assay (FDA approved for NASAL specimens only), is one component of a comprehensive MRSA colonization surveillance program. It is not intended to diagnose MRSA infection nor to guide or monitor treatment for MRSA infections. RESULT CALLED TO, READ BACK BY AND VERIFIED WITH: BUNGQUE,C RN AT 0230 ON [id number] SKEEN,P   Troponin I (q 6hr x 3)     Status: Abnormal   Collection Time: 01/22/15  3:25 AM  Result Value Ref Range   Troponin  I 0.09 (H) <0.031 ng/mL    Comment:        PERSISTENTLY INCREASED TROPONIN VALUES IN THE RANGE OF 0.04-0.49 ng/mL CAN BE SEEN IN:       -UNSTABLE ANGINA       -CONGESTIVE HEART FAILURE       -MYOCARDITIS       -CHEST TRAUMA       -ARRYHTHMIAS       -LATE PRESENTING MYOCARDIAL INFARCTION       -COPD   CLINICAL FOLLOW-UP RECOMMENDED.   Sedimentation rate     Status: None   Collection Time: 01/22/15  3:25 AM  Result Value Ref Range   Sed Rate 2 0 - 16 mm/hr  CBC     Status: None   Collection Time: 01/22/15  3:25 AM  Result Value Ref Range   WBC 8.1 4.0 - 10.5 K/uL   RBC 4.58 4.22 - 5.81 MIL/uL   Hemoglobin 14.5 13.0 - 17.0 g/dL    Comment: REPEATED TO VERIFY   HCT 44.5 39.0 - 52.0 %   MCV 97.2 78.0 - 100.0 fL   MCH 31.7 26.0 - 34.0 pg   MCHC 32.6 30.0 - 36.0 g/dL   RDW 13.4 11.5 - 15.5 %   Platelets 172 150 - 400 K/uL  Creatinine, serum     Status: None   Collection Time: 01/22/15  3:25 AM  Result Value Ref Range   Creatinine, Ser 0.85 0.50 - 1.35 mg/dL   GFR calc non Af Amer >90 >90 mL/min   GFR calc Af Amer >90 >90 mL/min  Comment: (NOTE) The eGFR has been calculated using the CKD EPI equation. This calculation has not been validated in all clinical situations. eGFR's persistently <90 mL/min signify possible Chronic Kidney Disease.   CBC with Differential/Platelet     Status: None   Collection Time: 01/22/15  3:45 AM  Result Value Ref Range   WBC DUPLICATE REQUEST 4.0 - 32.6 K/uL   RBC DUPLICATE REQUEST 7.12 - 5.81 MIL/uL   Hemoglobin DUPLICATE REQUEST 45.8 - 09.9 g/dL   HCT DUPLICATE REQUEST 83.3 - 82.5 %   MCV DUPLICATE REQUEST 05.3 - 976.7 fL   MCH DUPLICATE REQUEST 34.1 - 93.7 pg   MCHC DUPLICATE REQUEST 90.2 - 40.9 g/dL   RDW DUPLICATE REQUEST 73.5 - 15.5 %   Platelets DUPLICATE REQUEST 329 - 400 K/uL   Neutrophils Relative % 61 43 - 77 %   Neutro Abs 4.9 1.7 - 7.7 K/uL   Lymphocytes Relative 29 12 - 46 %   Lymphs Abs 2.3 0.7 - 4.0 K/uL   Monocytes  Relative 9 3 - 12 %   Monocytes Absolute 0.7 0.1 - 1.0 K/uL   Eosinophils Relative 1 0 - 5 %   Eosinophils Absolute 0.0 0.0 - 0.7 K/uL   Basophils Relative 0 0 - 1 %   Basophils Absolute 0.0 0.0 - 0.1 K/uL  Comprehensive metabolic panel     Status: Abnormal   Collection Time: 01/22/15  3:45 AM  Result Value Ref Range   Sodium 137 135 - 145 mmol/L   Potassium 4.5 3.5 - 5.1 mmol/L   Chloride 105 96 - 112 mmol/L   CO2 23 19 - 32 mmol/L   Glucose, Bld 199 (H) 70 - 99 mg/dL   BUN 9 6 - 23 mg/dL   Creatinine, Ser 0.88 0.50 - 1.35 mg/dL   Calcium 7.6 (L) 8.4 - 10.5 mg/dL   Total Protein 5.5 (L) 6.0 - 8.3 g/dL   Albumin 2.8 (L) 3.5 - 5.2 g/dL   AST 56 (H) 0 - 37 U/L   ALT 45 0 - 53 U/L   Alkaline Phosphatase 72 39 - 117 U/L   Total Bilirubin 1.1 0.3 - 1.2 mg/dL   GFR calc non Af Amer >90 >90 mL/min   GFR calc Af Amer >90 >90 mL/min    Comment: (NOTE) The eGFR has been calculated using the CKD EPI equation. This calculation has not been validated in all clinical situations. eGFR's persistently <90 mL/min signify possible Chronic Kidney Disease.    Anion gap 9 5 - 15    Imaging: Ct Head Wo Contrast  01/21/2015   CLINICAL DATA:  Found on floor, with incontinence. Question of seizure. Bradycardia. Initial encounter.  EXAM: CT HEAD WITHOUT CONTRAST  TECHNIQUE: Contiguous axial images were obtained from the base of the skull through the vertex without intravenous contrast.  COMPARISON:  None.  FINDINGS: There is no evidence of acute infarction, mass lesion, or intra- or extra-axial hemorrhage on CT.  Prominence of the ventricles and sulci suggests mild cortical loss. Cerebellar atrophy is noted. Mild periventricular and subcortical white matter change likely reflects small vessel ischemic microangiopathy. A small chronic lacunar infarct is seen at the right mid corona radiata. A small chronic lacunar infarct is also seen at the right basal ganglia.  The brainstem and fourth ventricle are within  normal limits. The cerebral hemispheres demonstrate grossly normal gray-white differentiation. No mass effect or midline shift is seen.  There is no evidence of fracture; visualized osseous structures are  unremarkable in appearance. The visualized portions of the orbits are within normal limits. The paranasal sinuses and mastoid air cells are well-aerated. No significant soft tissue abnormalities are seen.  IMPRESSION: 1. No acute intracranial pathology seen on CT. 2. Mild cortical volume loss and scattered small vessel ischemic microangiopathy. 3. 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   Electronically Signed   By: Garald Balding M.D.   On: 01/21/2015 20:24   Dg Foot Complete Right  01/21/2015   CLINICAL DATA:  Known diabetic ulcer on foot with pain  EXAM: RIGHT FOOT COMPLETE - 3+ VIEW  COMPARISON:  02/07/2011  FINDINGS: Significant bony changes are noted consistent with the given clinical history with diabetic foot and multiple Charcot joints. These changes have progressed in the interval from the prior exam. No definitive bony erosion to suggest osteomyelitis is noted.  IMPRESSION: Severe changes in the ankle and foot secondary to diabetic neuropathy and Charcot joint. No definitive erosion to suggest osteomyelitis is noted. MR would be more sensitive as clinically indicated.   Electronically Signed   By: Inez Catalina M.D.   On: 01/21/2015 20:42    Assessment:  1. Principal Problem: 2.   Syncope 3. Active Problems: 4.   CAD S/P percutaneous coronary angioplasty; bms X 2 to RCA 2001 5.   Bradycardia 6.   Complete heart block 7.   Right foot ulcer 8.   Diabetes mellitus type 2, uncontrolled 9.   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   Faintness 11.   Plan:  1. Syncope- this is related to transient complete heart block.  No chest pain. Troponins have been mildly elevated but flat, not consistent with ACS - although he does have a CAD history with  PCI to the RCA in 2001. I think proceeding to permanent pacemaker is the next step.  Awaiting 2D echo today.  2. Complete heart block - now stable with temporary transvenous pacer. Will need eval for permanent pacer- asked EP to see. 3. 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 Hold any AVN blockers - he was listed to be on metoprolol at home, but unclear if he was compliant and taking the medication. 4. Diabetes 2, uncontrolled - blood glucose improved this morning. Change sliding scale to resistant. Increase lantus to 10U QHS. 5. Chronic right foot osteomyelitis - no evidence for active skin infection. Not currently on antibiotics, followed by the wound care center - Dr. Dellia Nims. Also, x-ray does not suggest osteomyelitis at this time. 6. History of Etoh abuse - on CIWA protocol.  Time Spent Directly with Patient:  30 minutes  Length of Stay:  LOS: 1 day   Pixie Casino, MD, [facility] Attending Cardiologist [facility]  Skie Vitrano C 01/22/2015, 8:44 AM

## 2015-01-22 NOTE — H&P (View-Only) (Signed)
ELECTROPHYSIOLOGY CONSULT NOTE    Patient ID: Nathan Miller MRN: [id number], DOB/AGE: 02-Jun-1951 64 y.o.  Admit date: 01/21/2015 Date of Consult: 01/22/2015  Primary Physician: Terald Sleeper, MD Primary Cardiologist: Kirke Corin  Reason for Consultation: heart block and syncope  HPI:  Nathan Miller is a 64 y.o. male with a past medical history significant for CAD s/p BMS to RCA 2001, hypertension, diabetes, and chronic foot wound.  He was found down in his driveway and EMS was called.  He was in complete heart block with ventricular rates in the 30's and transferred to [facility] for further evaluation.  He had recurrent heart block in the ER and underwent temporary transvenous pacemaker implantation.  EP has been asked to evaluate for treatment options.  He had been prescribed Metoprolol but states that he has not been taking this medication.  He has not had recent chest pain, shortness of breath, fevers, chills, nausea or vomiting. He has had several syncopal spells over the last few weeks that are abrupt in onset and without prodrome.    There is no recent echocardiogram on the chart.  Pending for this admission. Lab work is notable for UDS positive for THC, normal electrolytes.    Past Medical History  Diagnosis Date  . Osteomyelitis   . Diabetes mellitus   . CAD (coronary artery disease)     NSTEMI in 2001. Cardiac cath showed: LAD: 40% proximal, LCX: 70% mid, RCA thrombotic occlusion in mid segment. s/p PCI and 2 overlapped BMSs to RCA.   [name] Hypertension      Surgical History:  Past Surgical History  Procedure Laterality Date  . Cardiac catheterization    . Coronary angioplasty    . Temporary pacemaker insertion N/A 01/21/2015    Procedure: TEMPORARY PACEMAKER INSERTION;  Surgeon: Marykay Lex, MD;  Location: [facility] CATH LAB;  Service: Cardiovascular;  Laterality: N/A;     Prescriptions prior to admission  Medication Sig Dispense Refill Last Dose  . potassium chloride (K-DUR)  10 MEQ tablet Take 10 mEq by mouth daily.   01/21/2015 at Unknown time  . atorvastatin (LIPITOR) 10 MG tablet Take 1 tablet (10 mg total) by mouth daily. (Patient not taking: Reported on 01/21/2015) 30 tablet 3 Not Taking at Unknown time  . metoprolol succinate (TOPROL-XL) 12.5 mg TB24 Take 0.5 tablets (12.5 mg total) by mouth 2 (two) times daily. (Patient not taking: Reported on 01/21/2015) 30 tablet 3 Not Taking at Unknown time    Inpatient Medications:  . amLODipine  10 mg Oral Daily  . aspirin  300 mg Rectal Daily   Or  . aspirin  325 mg Oral Daily  . Chlorhexidine Gluconate Cloth  6 each Topical Q0600  . enoxaparin (LOVENOX) injection  40 mg Subcutaneous Q24H  . folic acid  1 mg Intravenous Daily   Or  . folic acid  1 mg Oral Daily  . insulin aspart  0-15 Units Subcutaneous TID WC  . insulin aspart  0-5 Units Subcutaneous QHS  . insulin glargine  10 Units Subcutaneous QHS  . multivitamin with minerals  1 tablet Oral Daily  . mupirocin ointment  1 application Nasal BID  . thiamine  100 mg Oral Daily    Allergies:  Allergies  Allergen Reactions  . Penicillins     unknown    History   Social History  . Marital Status: Married    Spouse Name: N/A  . Number of Children: N/A  . Years of Education: N/A  Occupational History  . Not on file.   Social History Main Topics  . Smoking status: Former Smoker -- 0.30 packs/day for 40 years    Types: Cigarettes  . Smokeless tobacco: Former Neurosurgeon  . Alcohol Use: 25.2 oz/week    42 Cans of beer per week     Comment: beer  . Drug Use: No  . Sexual Activity: Not on file   Other Topics Concern  . Not on file   Social History Narrative     Family History  Problem Relation Age of Onset  . Hypertension Mother   . Hypertension Father      Review of Systems: All other systems reviewed and are otherwise negative except as noted above.  Physical Exam: Filed Vitals:   01/22/15 0600 01/22/15 0605 01/22/15 0700 01/22/15 0951    BP:  173/85 175/79 172/89  Pulse: 63 65 57   Temp:   97.8 F (36.6 C)   TempSrc:   Oral   Resp: 17 14 20    Height:      Weight:      SpO2: 99% 100% 99%     GEN- The patient is chronically ill appearing, alert and oriented x 3 today.   HEENT: normocephalic, atraumatic; sclera clear, conjunctiva pink; hearing intact; oropharynx clear; neck supple  Lymph- no cervical lymphadenopathy Lungs- Clear to ausculation bilaterally, normal work of breathing.  No wheezes, rales, rhonchi Heart- Regular rate and rhythm, no murmurs, rubs or gallops  GI- soft, non-tender, non-distended, bowel sounds present  Extremities- R foot with internal rotation and deformity, wound dressed MS- no significant deformity or atrophy Skin- warm and dry, no rash or lesion Psych- euthymic mood, full affect Neuro- strength and sensation are intact  Labs:   Lab Results  Component Value Date   WBC DUPLICATE REQUEST 01/22/2015   HGB DUPLICATE REQUEST 01/22/2015   HCT DUPLICATE REQUEST 01/22/2015   MCV DUPLICATE REQUEST 01/22/2015   PLT DUPLICATE REQUEST 01/22/2015    Recent Labs Lab 01/22/15 0345  NA 137  K 4.5  CL 105  CO2 23  BUN 9  CREATININE 0.88  CALCIUM 7.6*  PROT 5.5*  BILITOT 1.1  ALKPHOS 72  ALT 45  AST 56*  GLUCOSE 199*      Radiology/Studies: Ct Head Wo Contrast 01/21/2015   CLINICAL DATA:  Found on floor, with incontinence. Question of seizure. Bradycardia. Initial encounter.  EXAM: CT HEAD WITHOUT CONTRAST  TECHNIQUE: Contiguous axial images were obtained from the base of the skull through the vertex without intravenous contrast.  COMPARISON:  None.  FINDINGS: There is no evidence of acute infarction, mass lesion, or intra- or extra-axial hemorrhage on CT.  Prominence of the ventricles and sulci suggests mild cortical loss. Cerebellar atrophy is noted. Mild periventricular and subcortical white matter change likely reflects small vessel ischemic microangiopathy. A small chronic lacunar  infarct is seen at the right mid corona radiata. A small chronic lacunar infarct is also seen at the right basal ganglia.  The brainstem and fourth ventricle are within normal limits. The cerebral hemispheres demonstrate grossly normal gray-white differentiation. No mass effect or midline shift is seen.  There is no evidence of fracture; visualized osseous structures are unremarkable in appearance. The visualized portions of the orbits are within normal limits. The paranasal sinuses and mastoid air cells are well-aerated. No significant soft tissue abnormalities are seen.  IMPRESSION: 1. No acute intracranial pathology seen on CT. 2. Mild cortical volume loss and scattered small vessel ischemic microangiopathy.  3. 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   Electronically Signed   By: Roanna Raider M.D.   On: 01/21/2015 20:24   Dg Foot Complete Right 01/21/2015   CLINICAL DATA:  Known diabetic ulcer on foot with pain  EXAM: RIGHT FOOT COMPLETE - 3+ VIEW  COMPARISON:  02/07/2011  FINDINGS: Significant bony changes are noted consistent with the given clinical history with diabetic foot and multiple Charcot joints. These changes have progressed in the interval from the prior exam. No definitive bony erosion to suggest osteomyelitis is noted.  IMPRESSION: Severe changes in the ankle and foot secondary to diabetic neuropathy and Charcot joint. No definitive erosion to suggest osteomyelitis is noted. MR would be more sensitive as clinically indicated.   Electronically Signed   By: Alcide Clever M.D.   On: 01/21/2015 20:42    GMW:NUUVO rhythm with complete heart block, ventricular escape in the 30's  TELEMETRY: sinus rhythm with intermittent ventricular pacing at 50  Assessment/Plan: 1.  Complete heart block with syncope The patient has documented complete heart block on no AV nodal blocking agents and without reversible causes identified. Echocardiogram pending.   Risks, benefits, and alternatives to pacemaker implantation were reviewed with the patient and his family by Dr Johney Frame who wished to proceed.  2.  Chronic non healing foot ulcer Followed by Dr Leanord Hawking at the wound care center Xray today demonstrated no osteomyelitis No recent fevers, chills.  WBC normal  3.  CAD No recent ischemic symptoms Resume beta blocker after pacemaker implant   4.  HTN Add beta blocker as above after pacemaker implant  Signed, Gypsy Balsam, NP 01/22/2015 11:10 AM   I have seen, examined the patient, and reviewed the above assessment and plan.  On exam, he does have gangrenous toe changes.  Unfortunately he has demonstrated recurrent symptomatic transient complete heart block without reversible cause.  PPM is therefore indicated.   Risks, benefits, alternatives to pacemaker implantation were discussed in detail with the patient and his family today. The patient understands that the risks include but are not limited to bleeding, infection, pneumothorax, perforation, tamponade, vascular damage, renal failure, MI, stroke, death,  and lead dislodgement and wishes to proceed.  He also understands that his infectious risks are quite high.  I do not feel that we can reduce these risks presently. We will therefore proceed with PPM today. Will need patient to be evaluated by ID to see if long term suppressive antibiotics or any other therapy is warranted. Echo is pending  Co Sign: Hillis Range, MD 01/22/2015 2:08 PM

## 2015-01-22 NOTE — Progress Notes (Signed)
EEG Completed; Results Pending  

## 2015-01-22 NOTE — Interval H&P Note (Signed)
History and Physical Interval Note:   Echo is reviewed.  EF is normal.  No significant valvular abnormality.  01/22/2015 3:26 PM  [name]  has presented today for surgery, with the diagnosis of heart block  The various methods of treatment have been discussed with the patient and family. After consideration of risks, benefits and other options for treatment, the patient has consented to  Procedure(s): PERMANENT PACEMAKER INSERTION (N/A) as a surgical intervention .  The patient's history has been reviewed, patient examined, no change in status, stable for surgery.  I have reviewed the patient's chart and labs.  Questions were answered to the patient's satisfaction.     Thompson Grayer

## 2015-01-22 NOTE — Consult Note (Addendum)
ELECTROPHYSIOLOGY CONSULT NOTE    Patient ID: Nathan Miller MRN: [id number], DOB/AGE: 02-Jun-1951 64 y.o.  Admit date: 01/21/2015 Date of Consult: 01/22/2015  Primary Physician: Terald Sleeper, MD Primary Cardiologist: Kirke Corin  Reason for Consultation: heart block and syncope  HPI:  Nathan Miller is a 64 y.o. male with a past medical history significant for CAD s/p BMS to RCA 2001, hypertension, diabetes, and chronic foot wound.  He was found down in his driveway and EMS was called.  He was in complete heart block with ventricular rates in the 30's and transferred to [facility] for further evaluation.  He had recurrent heart block in the ER and underwent temporary transvenous pacemaker implantation.  EP has been asked to evaluate for treatment options.  He had been prescribed Metoprolol but states that he has not been taking this medication.  He has not had recent chest pain, shortness of breath, fevers, chills, nausea or vomiting. He has had several syncopal spells over the last few weeks that are abrupt in onset and without prodrome.    There is no recent echocardiogram on the chart.  Pending for this admission. Lab work is notable for UDS positive for THC, normal electrolytes.    Past Medical History  Diagnosis Date  . Osteomyelitis   . Diabetes mellitus   . CAD (coronary artery disease)     NSTEMI in 2001. Cardiac cath showed: LAD: 40% proximal, LCX: 70% mid, RCA thrombotic occlusion in mid segment. s/p PCI and 2 overlapped BMSs to RCA.   [name] Hypertension      Surgical History:  Past Surgical History  Procedure Laterality Date  . Cardiac catheterization    . Coronary angioplasty    . Temporary pacemaker insertion N/A 01/21/2015    Procedure: TEMPORARY PACEMAKER INSERTION;  Surgeon: Marykay Lex, MD;  Location: [facility] CATH LAB;  Service: Cardiovascular;  Laterality: N/A;     Prescriptions prior to admission  Medication Sig Dispense Refill Last Dose  . potassium chloride (K-DUR)  10 MEQ tablet Take 10 mEq by mouth daily.   01/21/2015 at Unknown time  . atorvastatin (LIPITOR) 10 MG tablet Take 1 tablet (10 mg total) by mouth daily. (Patient not taking: Reported on 01/21/2015) 30 tablet 3 Not Taking at Unknown time  . metoprolol succinate (TOPROL-XL) 12.5 mg TB24 Take 0.5 tablets (12.5 mg total) by mouth 2 (two) times daily. (Patient not taking: Reported on 01/21/2015) 30 tablet 3 Not Taking at Unknown time    Inpatient Medications:  . amLODipine  10 mg Oral Daily  . aspirin  300 mg Rectal Daily   Or  . aspirin  325 mg Oral Daily  . Chlorhexidine Gluconate Cloth  6 each Topical Q0600  . enoxaparin (LOVENOX) injection  40 mg Subcutaneous Q24H  . folic acid  1 mg Intravenous Daily   Or  . folic acid  1 mg Oral Daily  . insulin aspart  0-15 Units Subcutaneous TID WC  . insulin aspart  0-5 Units Subcutaneous QHS  . insulin glargine  10 Units Subcutaneous QHS  . multivitamin with minerals  1 tablet Oral Daily  . mupirocin ointment  1 application Nasal BID  . thiamine  100 mg Oral Daily    Allergies:  Allergies  Allergen Reactions  . Penicillins     unknown    History   Social History  . Marital Status: Married    Spouse Name: N/A  . Number of Children: N/A  . Years of Education: N/A  Occupational History  . Not on file.   Social History Main Topics  . Smoking status: Former Smoker -- 0.30 packs/day for 40 years    Types: Cigarettes  . Smokeless tobacco: Former Neurosurgeon  . Alcohol Use: 25.2 oz/week    42 Cans of beer per week     Comment: beer  . Drug Use: No  . Sexual Activity: Not on file   Other Topics Concern  . Not on file   Social History Narrative     Family History  Problem Relation Age of Onset  . Hypertension Mother   . Hypertension Father      Review of Systems: All other systems reviewed and are otherwise negative except as noted above.  Physical Exam: Filed Vitals:   01/22/15 0600 01/22/15 0605 01/22/15 0700 01/22/15 0951    BP:  173/85 175/79 172/89  Pulse: 63 65 57   Temp:   97.8 F (36.6 C)   TempSrc:   Oral   Resp: 17 14 20    Height:      Weight:      SpO2: 99% 100% 99%     GEN- The patient is chronically ill appearing, alert and oriented x 3 today.   HEENT: normocephalic, atraumatic; sclera clear, conjunctiva pink; hearing intact; oropharynx clear; neck supple  Lymph- no cervical lymphadenopathy Lungs- Clear to ausculation bilaterally, normal work of breathing.  No wheezes, rales, rhonchi Heart- Regular rate and rhythm, no murmurs, rubs or gallops  GI- soft, non-tender, non-distended, bowel sounds present  Extremities- R foot with internal rotation and deformity, wound dressed MS- no significant deformity or atrophy Skin- warm and dry, no rash or lesion Psych- euthymic mood, full affect Neuro- strength and sensation are intact  Labs:   Lab Results  Component Value Date   WBC DUPLICATE REQUEST 01/22/2015   HGB DUPLICATE REQUEST 01/22/2015   HCT DUPLICATE REQUEST 01/22/2015   MCV DUPLICATE REQUEST 01/22/2015   PLT DUPLICATE REQUEST 01/22/2015    Recent Labs Lab 01/22/15 0345  NA 137  K 4.5  CL 105  CO2 23  BUN 9  CREATININE 0.88  CALCIUM 7.6*  PROT 5.5*  BILITOT 1.1  ALKPHOS 72  ALT 45  AST 56*  GLUCOSE 199*      Radiology/Studies: Ct Head Wo Contrast 01/21/2015   CLINICAL DATA:  Found on floor, with incontinence. Question of seizure. Bradycardia. Initial encounter.  EXAM: CT HEAD WITHOUT CONTRAST  TECHNIQUE: Contiguous axial images were obtained from the base of the skull through the vertex without intravenous contrast.  COMPARISON:  None.  FINDINGS: There is no evidence of acute infarction, mass lesion, or intra- or extra-axial hemorrhage on CT.  Prominence of the ventricles and sulci suggests mild cortical loss. Cerebellar atrophy is noted. Mild periventricular and subcortical white matter change likely reflects small vessel ischemic microangiopathy. A small chronic lacunar  infarct is seen at the right mid corona radiata. A small chronic lacunar infarct is also seen at the right basal ganglia.  The brainstem and fourth ventricle are within normal limits. The cerebral hemispheres demonstrate grossly normal gray-white differentiation. No mass effect or midline shift is seen.  There is no evidence of fracture; visualized osseous structures are unremarkable in appearance. The visualized portions of the orbits are within normal limits. The paranasal sinuses and mastoid air cells are well-aerated. No significant soft tissue abnormalities are seen.  IMPRESSION: 1. No acute intracranial pathology seen on CT. 2. Mild cortical volume loss and scattered small vessel ischemic microangiopathy.  3. 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   Electronically Signed   By: Nathan Miller M.D.   On: 01/21/2015 20:24   Dg Foot Complete Right 01/21/2015   CLINICAL DATA:  Known diabetic ulcer on foot with pain  EXAM: RIGHT FOOT COMPLETE - 3+ VIEW  COMPARISON:  02/07/2011  FINDINGS: Significant bony changes are noted consistent with the given clinical history with diabetic foot and multiple Charcot joints. These changes have progressed in the interval from the prior exam. No definitive bony erosion to suggest osteomyelitis is noted.  IMPRESSION: Severe changes in the ankle and foot secondary to diabetic neuropathy and Charcot joint. No definitive erosion to suggest osteomyelitis is noted. MR would be more sensitive as clinically indicated.   Electronically Signed   By: Nathan Miller M.D.   On: 01/21/2015 20:42    GMW:NUUVO rhythm with complete heart block, ventricular escape in the 30's  TELEMETRY: sinus rhythm with intermittent ventricular pacing at 50  Assessment/Plan: 1.  Complete heart block with syncope The patient has documented complete heart block on no AV nodal blocking agents and without reversible causes identified. Echocardiogram pending.   Risks, benefits, and alternatives to pacemaker implantation were reviewed with the patient and his family by Dr Johney Frame who wished to proceed.  2.  Chronic non healing foot ulcer Followed by Dr Leanord Hawking at the wound care center Xray today demonstrated no osteomyelitis No recent fevers, chills.  WBC normal  3.  CAD No recent ischemic symptoms Resume beta blocker after pacemaker implant   4.  HTN Add beta blocker as above after pacemaker implant  Signed, Gypsy Balsam, NP 01/22/2015 11:10 AM   I have seen, examined the patient, and reviewed the above assessment and plan.  On exam, he does have gangrenous toe changes.  Unfortunately he has demonstrated recurrent symptomatic transient complete heart block without reversible cause.  PPM is therefore indicated.   Risks, benefits, alternatives to pacemaker implantation were discussed in detail with the patient and his family today. The patient understands that the risks include but are not limited to bleeding, infection, pneumothorax, perforation, tamponade, vascular damage, renal failure, MI, stroke, death,  and lead dislodgement and wishes to proceed.  He also understands that his infectious risks are quite high.  I do not feel that we can reduce these risks presently. We will therefore proceed with PPM today. Will need patient to be evaluated by ID to see if long term suppressive antibiotics or any other therapy is warranted. Echo is pending  Co Sign: Hillis Range, MD 01/22/2015 2:08 PM

## 2015-01-22 NOTE — Progress Notes (Signed)
  Echocardiogram 2D Echocardiogram has been performed.  Darlina Sicilian M 01/22/2015, 12:50 PM

## 2015-01-22 NOTE — Consult Note (Signed)
WOC wound consult note Reason for Consult: right foot wound.  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 He is followed by Dr. Dellia Nims at the [place] center.  They are using silver hydrofiber currently at home daily.  Caregiver in the room reports that it is the best it has looked in a while.  Has Charcot foot deformity and does have orthotic for this foot he uses when he is up ambulating.  Wound type: neuropathic foot ulcer  Measurement: 4.0cm x 4.0cm x 0.2cm centrally  Wound bed: 100% black hyperkeratotic skin with central yellow/gray area that is soft.  Drainage (amount, consistency, odor) moderate, serosanguineous, slight odor noted Periwound:  intact  Dressing procedure/placement/frequency: Silver hydrofiber to be packed into the wound daily, cover with dry dressing, secure with conform or kerlix. Follow up with Dr. Dellia Nims at the wound care center at the time of DC to home.     Discussed POC with patient and bedside nurse.  Re consult if needed, will not follow at this time. Thanks  Nathan Miller, [place] [phone])

## 2015-01-22 NOTE — Care Management Note (Addendum)
    Page 1 of 1   01/26/2015     4:44:35 PM CARE MANAGEMENT NOTE 01/26/2015  Patient:  Nathan Miller, Nathan Miller   Account Number:  [id number]  Date Initiated:  01/22/2015  Documentation initiated by:  Elissa Hefty  Subjective/Objective Assessment:   adm w syncope, brady     Action/Plan:   lives w wife, pcp dr [name] Dellia Nims   Anticipated DC Date:  01/25/2015   Anticipated DC Plan:  [place]  CM consult      [place]   Choice offered to / List presented to:  C-1 Patient        [place] arranged  HH-1 RN  [place].   Status of service:  Completed, signed off Medicare Important Message given?  YES (If response is "NO", the following Medicare IM given date fields will be blank) Date Medicare IM given:  01/25/2015 Medicare IM given by:  Rosland Riding Date Additional Medicare IM given:   Additional Medicare IM given by:    Discharge Disposition:  [place]  Per UR Regulation:  Reviewed for med. necessity/level of care/duration of stay  If discussed at [place] of Stay Meetings, dates discussed:    Comments:  01/25/15 Ellan Lambert, RN, BSN [phone] Pt for dc home today with spouse.  Needs HH follow up, PCP. Pt prefers HH with [facility], as has used in the past.  Referal to [facility] for [facility] follow up.  Start of care 24-48h post dc date.  Wife on phone at this time with Dr. Redge Gainer in [place] making appt for PCP follow up.

## 2015-01-22 NOTE — Op Note (Signed)
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 Left upper extremity venography.   2. Pacemaker implantation.     INTRODUCTION: Nathan Miller is a 64 y.o. 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  The patient reports recurrent syncope.  He takes no medicines regularly.  No reversible causes have been identified.  The patient therefore presents today for pacemaker implantation.     DESCRIPTION OF PROCEDURE:  Informed written consent was obtained, and the patient was brought to the electrophysiology lab in a fasting state.  The patient required no sedation for the procedure today.  The patients left chest was prepped and draped in the usual sterile fashion by the EP lab staff. The skin overlying the left deltopectoral region was infiltrated with lidocaine for local analgesia.  A 4-cm incision was made over the left deltopectoral region.  A left subcutaneous pacemaker pocket was fashioned using a combination of sharp and blunt dissection. Electrocautery was required to assure hemostasis.    Left Upper Extremity Venography: A venogram of the left upper extremity was performed, which revealed a large left cephalic vein, which emptied into a large left subclavian vein.  The left axillary vein was small in size.  With attempted cannulation of the left axillary vein, the vein spasmed and could not be accessed.  A repeat venogram was performed which revealed flow from the left cephalic vein which joined the left jugular vein and formed the subclavian vein.  Due to spasm, the axillary vein did not opacify at all.  Multiple attempts were made to access the axillary and subclavian veins  without success.  RV Lead Placement: The left cephalic vein was therefore directly visualized and cannulated.  Through the left cephalic vein, a Medtronic model [id number] (serial number [id number]) right ventricular lead were advanced with fluoroscopic visualization into the right ventricular apical septal position.   Right ventricular lead R-waves measured 7 mV with an impedance of 1659 ohms and a threshold of 0.8 V at 0.5 msec.  Multiple attempts were made to place an atrial lead through the left caphalic vein.  Unfortunately this vein was too small to accomidate 2 leads.  Only the ventricular lead could be placed.  After a 30 minute wait, I tried again to access the left axillary vein.  This was again unsuccessful.   Device Placement:  The lead was then connected to a Medtronic Advisa SR MRI Surescan model [id number] (serial number [id number]) pacemaker.  The pocket was irrigated with copious gentamicin solution.  The pacemaker was then placed into the pocket.  The pocket was then closed in 2 layers with 2.0 Vicryl suture for the subcutaneous and subcuticular layers.  Steri- Strips and a sterile dressing were then applied. EBL<34ml. There were no early apparent complications.  The previously placed temporary pacing wire was then removed with fluoroscopic visualization.  This was a very technically challenging case today due to poor venous access and axillary vein spasm.  An additional hour was required for the procedure today.    CONCLUSIONS:   1. Successful implantation of a Medtronic Advisa SR MRI Surescan single chamber pacemaker.  An atrial lead could not be  successfully placed today.  2. No early apparent complications.     Permanent Pacemaker Indication: Documented non-reversible symptomatic bradycardia due to second degree and/or third degree atrioventricular block.        Thompson Grayer, MD 01/22/2015 5:59 PM

## 2015-01-23 ENCOUNTER — Inpatient Hospital Stay (HOSPITAL_COMMUNITY): Payer: Medicare Other

## 2015-01-23 ENCOUNTER — Other Ambulatory Visit: Payer: Self-pay

## 2015-01-23 LAB — BASIC METABOLIC PANEL
Anion gap: 8 (ref 5–15)
BUN: 8 mg/dL (ref 6–23)
CO2: 28 mmol/L (ref 19–32)
Calcium: 8.5 mg/dL (ref 8.4–10.5)
Chloride: 99 mmol/L (ref 96–112)
Creatinine, Ser: 0.81 mg/dL (ref 0.50–1.35)
GFR calc Af Amer: >90 mL/min (ref >90)
GFR calc non Af Amer: >90 mL/min (ref >90)
Glucose, Bld: 191 mg/dL — ABNORMAL HIGH (ref 70–99)
Potassium: 3.3 mmol/L — ABNORMAL LOW (ref 3.5–5.1)
Sodium: 135 mmol/L (ref 135–145)

## 2015-01-23 LAB — GLUCOSE, CAPILLARY
Glucose-Capillary: 261 mg/dL — ABNORMAL HIGH (ref 70–99)
Glucose-Capillary: 155 mg/dL — ABNORMAL HIGH (ref 70–99)
Glucose-Capillary: 208 mg/dL — ABNORMAL HIGH (ref 70–99)
Glucose-Capillary: 142 mg/dL — ABNORMAL HIGH (ref 70–99)

## 2015-01-23 LAB — HEMOGLOBIN A1C
Hgb A1c MFr Bld: 13.4 % — ABNORMAL HIGH (ref 4.8–5.6)
Mean Plasma Glucose: 338 mg/dL

## 2015-01-23 NOTE — Evaluation (Addendum)
Physical Therapy Evaluation Patient Details Name: Nathan Miller MRN: [id number] DOB: Aug 20, 1951 Today's Date: 01/23/2015   History of Present Illness  Admitted after found down in driveway; has had multiple syncopal events in the past months; found to have 3rd degree heart block, and now s/p pacemaker placement  Past Medical History  Diagnosis Date  . Osteomyelitis   . Diabetes mellitus   . CAD (coronary artery disease)     NSTEMI in 2001. Cardiac cath showed: LAD: 40% proximal, LCX: 70% mid, RCA thrombotic occlusion in mid segment. s/p PCI and 2 overlapped BMSs to RCA.   [name] Hypertension    Past Surgical History  Procedure Laterality Date  . Cardiac catheterization    . Coronary angioplasty    . Temporary pacemaker insertion N/A 01/21/2015    Procedure: TEMPORARY PACEMAKER INSERTION;  Surgeon: Marykay Lex, MD;  Location: [facility] CATH LAB;  Service: Cardiovascular;  Laterality: N/A;  . Permanent pacemaker insertion N/A 01/22/2015    Procedure: PERMANENT PACEMAKER INSERTION;  Surgeon: Hillis Range, MD;  Location: [facility] CATH LAB;  Service: Cardiovascular;  Laterality: N/A;     Clinical Impression  Pt admitted with above diagnosis. Pt currently with functional limitations due to the deficits listed below (see PT Problem List).  Pt will benefit from skilled PT to increase their independence and safety with mobility to allow discharge to the venue listed below.       Follow Up Recommendations Home health PT;Supervision - Intermittent (I anticipate that he will decline HHPT; would push for [facility] for chronic disease management)    Equipment Recommendations  3in1 (PT)    Recommendations for Other Services OT consult     Precautions / Restrictions Precautions Precautions: Fall      Mobility  Bed Mobility Overal bed mobility: Modified Independent             General bed mobility comments: HOB elevated  Transfers Overall transfer level: Needs assistance Equipment used: 1 person  hand held assist Transfers: Sit to/from Stand Sit to Stand: Min guard         General transfer comment: Heavily dependent on RUE for steadiness  Ambulation/Gait Ambulation/Gait assistance: Min guard Ambulation Distance (Feet): 100 Feet Assistive device: 1 person hand held assist Gait Pattern/deviations: Step-through pattern Gait velocity: slowed   General Gait Details: Slow moving, dependent on R UE support; walked with custom-fit shoe R foot  Stairs            Wheelchair Mobility    Modified Rankin (Stroke Patients Only)       Balance                                             Pertinent Vitals/Pain Pain Assessment: 0-10 Pain Score: 6  Pain Location: at pacemaker site Pain Descriptors / Indicators: Aching;Operative site guarding Pain Intervention(s): Monitored during session;Repositioned    Home Living Family/patient expects to be discharged to:: Private residence Living Arrangements: Spouse/significant other Available Help at Discharge: Family;Available 24 hours/day Type of Home: House Home Access: Stairs to enter Entrance Stairs-Rails: Right Entrance Stairs-Number of Steps: 4 Home Layout: One level Home Equipment: Walker - 2 wheels Additional Comments: has a custom fit shoe for R foot due to wound    Prior Function Level of Independence: Needs assistance      ADL's / Homemaking Assistance Needed: Family assist prn  Hand Dominance        Extremity/Trunk Assessment   Upper Extremity Assessment: Defer to OT evaluation (LUE in sling)           Lower Extremity Assessment: Generalized weakness (Noted heavy reliance on UEs)         Communication   Communication: No difficulties  Cognition Arousal/Alertness: Awake/alert Behavior During Therapy: WFL for tasks assessed/performed Overall Cognitive Status: Within Functional Limits for tasks assessed                      General Comments  See serial BPs  in doc flowsheets for orthostatic vitals    Exercises        Assessment/Plan    PT Assessment Patient needs continued PT services  PT Diagnosis Generalized weakness   PT Problem List Decreased strength;Decreased range of motion;Decreased activity tolerance;Decreased balance;Decreased mobility;Decreased knowledge of use of DME;Decreased safety awareness;Pain;Decreased knowledge of precautions  PT Treatment Interventions DME instruction;Gait training;Stair training;Functional mobility training;Therapeutic activities;Therapeutic exercise;Patient/family education   PT Goals (Current goals can be found in the Care Plan section) Acute Rehab PT Goals Patient Stated Goal: wants to feel better PT Goal Formulation: With patient Time For Goal Achievement: 02/06/15 Potential to Achieve Goals: Good    Frequency Min 3X/week   Barriers to discharge        Co-evaluation               End of Session Equipment Utilized During Treatment: Other (comment) (Sling L UE) Activity Tolerance: Patient tolerated treatment well Patient left: in bed;with call bell/phone within reach;with family/visitor present (EOB) Nurse Communication: Mobility status         Time: 3086-5784 PT Time Calculation (min) (ACUTE ONLY): 33 min   Charges:   PT Evaluation $Initial PT Evaluation Tier I: 1 Procedure PT Treatments $Gait Training: 8-22 mins   PT G Codes:        Olen Pel 01/23/2015, 12:55 PM  Van Clines, PT  Acute Rehabilitation Services Pager [phone] Office [phone]

## 2015-01-23 NOTE — Progress Notes (Signed)
Patient Name: Nathan Miller      SUBJECTIVE: Pt with syncope and intermittent heart block underwent pacemaker yesterday which was coplicated by difficulty with access and the ability thus to place just a single lead   A single chamber device was then utilized     Past Medical History  Diagnosis Date  . Osteomyelitis   . Diabetes mellitus   . CAD (coronary artery disease)     NSTEMI in 2001. Cardiac cath showed: LAD: 40% proximal, LCX: 70% mid, RCA thrombotic occlusion in mid segment. s/p PCI and 2 overlapped BMSs to RCA.   [name] Hypertension     Scheduled Meds:  Scheduled Meds: . amLODipine  10 mg Oral Daily  . Chlorhexidine Gluconate Cloth  6 each Topical Q0600  . folic acid  1 mg Intravenous Daily   Or  . folic acid  1 mg Oral Daily  . insulin aspart  0-15 Units Subcutaneous TID WC  . insulin aspart  0-5 Units Subcutaneous QHS  . insulin glargine  10 Units Subcutaneous QHS  . multivitamin with minerals  1 tablet Oral Daily  . mupirocin ointment  1 application Nasal BID  . sodium chloride  3 mL Intravenous Q12H  . thiamine  100 mg Oral Daily   Continuous Infusions:  sodium chloride, acetaminophen, hydrALAZINE, HYDROcodone-acetaminophen, LORazepam, ondansetron (ZOFRAN) IV, senna-docusate, sodium chloride    PHYSICAL EXAM Filed Vitals:   01/23/15 1154 01/23/15 1158 01/23/15 1202 01/23/15 1206  BP: 157/72 156/77 144/85 134/68  Pulse: 81 79 93 96  Temp:      TempSrc:      Resp:      Height:      Weight:      SpO2:        Well developed and nourished in no acute distress HENT normal Neck supple with JVP-flat Carotids brisk and full without bruits Clear Regular rate and rhythm, no murmurs or gallops Abd-soft with active BS without hepatomegaly No Clubbing cyanosis edema Skin-warm and dry A & Oriented  Grossly normal sensory and motor function   TELEMETRY: Reviewed telemetry pt in  Sinus     Intake/Output Summary (Last 24 hours) at 01/23/15  1308 Last data filed at 01/23/15 1000  Gross per 24 hour  Intake    620 ml  Output   1250 ml  Net   -630 ml    LABS: Basic Metabolic Panel:  Recent Labs Lab 01/21/15 1825 01/21/15 1843 01/22/15 0325 01/22/15 0345 01/23/15 0316  NA 137 138  --  137 135  K 4.4 4.6  --  4.5 3.3*  CL 103 101  --  105 99  CO2 19  --   --  23 28  GLUCOSE 417* 430*  --  199* 191*  BUN 9 12  --  9 8  CREATININE 1.12 1.00 0.85 0.88 0.81  CALCIUM 8.5  --   --  7.6* 8.5  MG 2.4  --   --   --   --    Cardiac Enzymes:  Recent Labs  01/21/15 1825 01/21/15 2152 01/22/15 0325 01/22/15 0900  CKTOTAL 67  --   --   --   TROPONINI  --  0.09* 0.09* 0.05*   CBC:  Recent Labs Lab 01/21/15 1825 01/21/15 1843 01/22/15 0325 01/22/15 0345  WBC 83.1  --  8.1 DUPLICATE REQUEST  NEUTROABS 8.2*  --   --  4.9  HGB 51.7 61.6* 07.3 DUPLICATE REQUEST  HCT 34.2 87.6* 81.1 DUPLICATE REQUEST  MCV 57.2  --  62.0 DUPLICATE REQUEST  PLT 355  --  974 DUPLICATE REQUEST   PROTIME: No results for input(s): LABPROT, INR in the last 72 hours. Liver Function Tests:  Recent Labs  01/21/15 1825 01/22/15 0345  AST 65* 56*  ALT 52 45  ALKPHOS 84 72  BILITOT 0.4 1.1  PROT 6.6 5.5*  ALBUMIN 3.2* 2.8*   No results for input(s): LIPASE, AMYLASE in the last 72 hours. BNP: BNP (last 3 results) No results for input(s): BNP in the last 8760 hours.  ProBNP (last 3 results) No results for input(s): PROBNP in the last 8760 hours.  D-Dimer: No results for input(s): DDIMER in the last 72 hours. Hemoglobin A1C:  Recent Labs  01/22/15 0325  HGBA1C 13.4*   Fasting Lipid Panel: No results for input(s): CHOL, HDL, LDLCALC, TRIG, CHOLHDL, LDLDIRECT in the last 72 hours. Thyroid Function Tests:  Recent Labs  01/21/15 2152  TSH 2.244   Anemia Panel: No results for input(s): VITAMINB12, FOLATE, FERRITIN, TIBC, IRON, RETICCTPCT in the last 72 hours.  CXR reviewed with stable lead position  Device  Interrogation normal device function   ASSESSMENT AND PLAN:  Principal Problem:   Syncope Active Problems:   CAD S/P percutaneous coronary angioplasty; bms X 2 to RCA 2001   Bradycardia   Complete heart block   Right foot ulcer   Diabetes mellitus type 2, uncontrolled   2Nd degree atrioventricular block   Faintness  S/p pacer-single chamber Unable to place atrail lead 2/2 difficulty with venous access Device function normal insturctions given   Ok to discharge  Signed, Virl Axe MD  01/23/2015

## 2015-01-23 NOTE — Procedures (Signed)
ELECTROENCEPHALOGRAM REPORT  Patient: Nathan Miller       Room #: 2X52 EEG No. ID: [id number] Age: 64 y.o.        Sex: male Referring Physician: Clearnce Hasten Report Date:  01/23/2015        Interpreting Physician: Anthony Sar  History: Nathan Miller is an 64 y.o. male with complete heart block who presented with recurrent episodes of loss of consciousness. He reportedly had jerking movements of extremities during one witnessed spell of loss of consciousness.  Indications for study:  Rule out seizure disorder.  Technique: This is an 18 channel routine scalp EEG performed at the bedside with bipolar and monopolar montages arranged in accordance to the international 10/20 system of electrode placement.   Description: This EEG recording was performed during wakefulness. Predominant background activity consisted of 9 Hz symmetrical alpha rhythm which attenuated well with eye-opening. Photic stimulation and hyperventilation were not performed. No epileptiform discharges were recorded. There was no abnormal slowing of cerebral activity.  Interpretation: This is a normal EEG recording during wakefulness. No evidence of an epileptic disorder was demonstrated.   Rush Farmer M.D. [facility] [phone]

## 2015-01-24 DIAGNOSIS — Z9861 Coronary angioplasty status: Secondary | ICD-10-CM

## 2015-01-24 DIAGNOSIS — E119 Type 2 diabetes mellitus without complications: Secondary | ICD-10-CM

## 2015-01-24 DIAGNOSIS — I2510 Atherosclerotic heart disease of native coronary artery without angina pectoris: Secondary | ICD-10-CM

## 2015-01-24 LAB — GLUCOSE, CAPILLARY
Glucose-Capillary: 187 mg/dL — ABNORMAL HIGH (ref 70–99)
Glucose-Capillary: 182 mg/dL — ABNORMAL HIGH (ref 70–99)
Glucose-Capillary: 151 mg/dL — ABNORMAL HIGH (ref 70–99)
Glucose-Capillary: 159 mg/dL — ABNORMAL HIGH (ref 70–99)

## 2015-01-24 LAB — BASIC METABOLIC PANEL
Anion gap: 9 (ref 5–15)
BUN: 9 mg/dL (ref 6–23)
CO2: 30 mmol/L (ref 19–32)
Calcium: 8.9 mg/dL (ref 8.4–10.5)
Chloride: 94 mmol/L — ABNORMAL LOW (ref 96–112)
Creatinine, Ser: 0.8 mg/dL (ref 0.50–1.35)
GFR calc Af Amer: >90 mL/min (ref >90)
GFR calc non Af Amer: >90 mL/min (ref >90)
Glucose, Bld: 151 mg/dL — ABNORMAL HIGH (ref 70–99)
Potassium: 3.6 mmol/L (ref 3.5–5.1)
Sodium: 133 mmol/L — ABNORMAL LOW (ref 135–145)

## 2015-01-24 LAB — CBC
HCT: 47.2 % (ref 39.0–52.0)
Hemoglobin: 15.8 g/dL (ref 13.0–17.0)
MCH: 32.3 pg (ref 26.0–34.0)
MCHC: 33.5 g/dL (ref 30.0–36.0)
MCV: 96.5 fL (ref 78.0–100.0)
Platelets: 195 10*3/uL (ref 150–400)
RBC: 4.89 MIL/uL (ref 4.22–5.81)
RDW: 13 % (ref 11.5–15.5)
WBC: 8.5 10*3/uL (ref 4.0–10.5)

## 2015-01-24 MED ORDER — CARVEDILOL 6.25 MG PO TABS
6.2500 mg | ORAL_TABLET | Freq: Two times a day (BID) | ORAL | Status: DC
  Administered 2015-01-24 – 2015-01-25 (×2): 6.25 mg via ORAL
  Filled 2015-01-24 (×4): qty 1

## 2015-01-24 MED ORDER — METFORMIN HCL 500 MG PO TABS
500.0000 mg | ORAL_TABLET | Freq: Two times a day (BID) | ORAL | Status: DC
  Administered 2015-01-25: 500 mg via ORAL
  Filled 2015-01-24 (×3): qty 1

## 2015-01-24 MED ORDER — POTASSIUM CHLORIDE CRYS ER 20 MEQ PO TBCR
40.0000 meq | EXTENDED_RELEASE_TABLET | Freq: Once | ORAL | Status: AC
  Administered 2015-01-24: 40 meq via ORAL
  Filled 2015-01-24: qty 2

## 2015-01-24 MED ORDER — ATORVASTATIN CALCIUM 80 MG PO TABS
80.0000 mg | ORAL_TABLET | Freq: Every day | ORAL | Status: DC
  Administered 2015-01-24: 80 mg via ORAL
  Filled 2015-01-24 (×2): qty 1

## 2015-01-24 MED ORDER — ASPIRIN 81 MG PO CHEW
81.0000 mg | CHEWABLE_TABLET | Freq: Once | ORAL | Status: AC
  Administered 2015-01-24: 81 mg via ORAL
  Filled 2015-01-24: qty 1

## 2015-01-24 MED ORDER — INSULIN GLARGINE 100 UNIT/ML ~~LOC~~ SOLN
12.0000 [IU] | Freq: Every day | SUBCUTANEOUS | Status: DC
  Administered 2015-01-24: 12 [IU] via SUBCUTANEOUS
  Filled 2015-01-24 (×2): qty 0.12

## 2015-01-24 MED ORDER — LISINOPRIL 10 MG PO TABS
10.0000 mg | ORAL_TABLET | Freq: Every day | ORAL | Status: DC
  Administered 2015-01-24 – 2015-01-25 (×2): 10 mg via ORAL
  Filled 2015-01-24 (×2): qty 1

## 2015-01-24 NOTE — Discharge Instructions (Signed)
° ° °  Supplemental Discharge Instructions for  Pacemaker/Defibrillator Patients  Activity No heavy lifting or vigorous activity with your left/right arm for 6 to 8 weeks.  Do not raise your left/right arm above your head for one week.  Gradually raise your affected arm as drawn below.             3/27                         3/28                         3/29                         3/30 __  NO DRIVING for   1 week  ; you may begin driving on   02/04/08  .  WOUND CARE - Keep the wound area clean and dry.  Do not get this area wet for one week. No showers for one week; you may shower on     . - The tape/steri-strips on your wound will fall off; do not pull them off.  No bandage is needed on the site.  DO  NOT apply any creams, oils, or ointments to the wound area. - If you notice any drainage or discharge from the wound, any swelling or bruising at the site, or you develop a fever > 101? F after you are discharged home, call the office at once.  Special Instructions - You are still able to use cellular telephones; use the ear opposite the side where you have your pacemaker/defibrillator.  Avoid carrying your cellular phone near your device. - When traveling through airports, show security personnel your identification card to avoid being screened in the metal detectors.  Ask the security personnel to use the hand wand. - Avoid arc welding equipment, MRI testing (magnetic resonance imaging), TENS units (transcutaneous nerve stimulators).  Call the office for questions about other devices. - Avoid electrical appliances that are in poor condition or are not properly grounded. - Microwave ovens are safe to be near or to operate.  Additional information for defibrillator patients should your device go off: - If your device goes off ONCE and you feel fine afterward, notify the device clinic nurses. - If your device goes off ONCE and you do not feel well afterward, call 911. - If your device goes off  TWICE, call 911. - If your device goes off THREE times in one day, call 911.  DO NOT DRIVE YOURSELF OR A FAMILY MEMBER WITH A DEFIBRILLATOR TO THE HOSPITAL--CALL 911.

## 2015-01-24 NOTE — Progress Notes (Signed)
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 Ready to go home. Unaware that he is a diabetic. Not taking asa, statin or bb at home. Unsure why.   CURRENT MEDS . amLODipine  10 mg Oral Daily  . Chlorhexidine Gluconate Cloth  6 each Topical Q0600  . folic acid  1 mg Intravenous Daily   Or  . folic acid  1 mg Oral Daily  . insulin aspart  0-15 Units Subcutaneous TID WC  . insulin aspart  0-5 Units Subcutaneous QHS  . insulin glargine  10 Units Subcutaneous QHS  . multivitamin with minerals  1 tablet Oral Daily  . mupirocin ointment  1 application Nasal BID  . sodium chloride  3 mL Intravenous Q12H  . thiamine  100 mg Oral Daily    OBJECTIVE  Filed Vitals:   01/23/15 2100 01/24/15 0000 01/24/15 0411 01/24/15 1026  BP: 153/93 119/79 126/84 145/77  Pulse: 72 53 67 63  Temp: 98.1 F (36.7 C)  98.1 F (36.7 C) 98.5 F (36.9 C)  TempSrc: Oral  Oral Oral  Resp: 18  16 16   Height:      Weight:   230 lb 14.4 oz (104.736 kg)   SpO2: 97%  98% 97%    Intake/Output Summary (Last 24 hours) at 01/24/15 1127 Last data filed at 01/24/15 0830  Gross per 24 hour  Intake    460 ml  Output    350 ml  Net    110 ml   Filed Weights   01/22/15 0040 01/24/15 0411  Weight: 231 lb 4.2 oz (104.9 kg) 230 lb 14.4 oz (104.736 kg)    PHYSICAL EXAM  General: Pleasant, NAD. Obese. chroincally ill appearing  Neuro: Alert and oriented X 3. Moves all extremities spontaneously. Psych: Normal affect. HEENT:  Normal  Neck: Supple without bruits or JVD. Lungs:  Resp regular and unlabored, CTA. Heart: RRR no s3, s4, or murmurs. Abdomen: Soft, non-tender, non-distended, BS + x 4.    Extremities: No clubbing, cyanosis or edema. DP/PT/Radials 2+ and equal bilaterally. R>L . This is chronic due to old accident .    Accessory Clinical Findings  CBC  Recent Labs  01/21/15 1825  01/22/15 0325 01/22/15 0345  WBC 29.5  --  8.1 DUPLICATE REQUEST  NEUTROABS 8.2*  --   --  4.9  HGB 18.8  < > 41.6 DUPLICATE REQUEST  HCT 60.6  < > 30.1 DUPLICATE REQUEST  MCV 60.1  --  09.3 DUPLICATE REQUEST  PLT 235  --  573 DUPLICATE REQUEST  < > = values in this interval not displayed. Basic Metabolic Panel  Recent Labs  01/21/15 1825  01/22/15 0345 01/23/15 0316  NA 137  < > 137 135  K 4.4  < > 4.5 3.3*  CL 103  < > 105 99  CO2 19  --  23 28  GLUCOSE 417*  < > 199* 191*  BUN 9  < > 9 8  CREATININE 1.12  < > 0.88 0.81  CALCIUM 8.5  --  7.6* 8.5  MG 2.4  --   --   --   < > =  values in this interval not displayed. Liver Function Tests  Recent Labs  01/21/15 1825 01/22/15 0345  AST 65* 56*  ALT 52 45  ALKPHOS 84 72  BILITOT 0.4 1.1  PROT 6.6 5.5*  ALBUMIN 3.2* 2.8*   No results for input(s): LIPASE, AMYLASE in the last 72 hours. Cardiac Enzymes  Recent Labs  01/21/15 1825 01/21/15 2152 01/22/15 0325 01/22/15 0900  CKTOTAL 67  --   --   --   TROPONINI  --  0.09* 0.09* 0.05*    Hemoglobin A1C  Recent Labs  01/22/15 0325  HGBA1C 13.4*    Thyroid Function Tests  Recent Labs  01/21/15 2152  TSH 2.244    Radiology/Studies  Dg Chest 2 View  01/23/2015   CLINICAL DATA:  Status post cardiac device placement. Initial encounter.  EXAM: CHEST  2 VIEW  COMPARISON:  Chest radiograph performed 11/23/2011  FINDINGS: There has been interval placement of a left-sided pacemaker, with a single lead ending overlying the right ventricle.  Lucency at the lung apices likely reflects some degree of emphysema. Minimal bibasilar atelectasis is noted. No pleural effusion or pneumothorax is seen.  The cardiomediastinal silhouette is borderline normal in size. No acute  osseous abnormalities are identified. Anterior bridging osteophytes are seen along the thoracic spine.  IMPRESSION: 1. Left-sided pacemaker noted, with a single lead ending overlying the right ventricle. 2. Lucency at the lung apices likely reflects some degree of emphysema. Minimal bibasilar atelectasis noted.   Electronically Signed   By: Garald Balding M.D.   On: 01/23/2015 08:19   Ct Head Wo Contrast  01/21/2015   CLINICAL DATA:  Found on floor, with incontinence. Question of seizure. Bradycardia. Initial encounter.  EXAM: CT HEAD WITHOUT CONTRAST  TECHNIQUE: Contiguous axial images were obtained from the base of the skull through the vertex without intravenous contrast.  COMPARISON:  None.  FINDINGS: There is no evidence of acute infarction, mass lesion, or intra- or extra-axial hemorrhage on CT.  Prominence of the ventricles and sulci suggests mild cortical loss. Cerebellar atrophy is noted. Mild periventricular and subcortical white matter change likely reflects small vessel ischemic microangiopathy. A small chronic lacunar infarct is seen at the right mid corona radiata. A small chronic lacunar infarct is also seen at the right basal ganglia.  The brainstem and fourth ventricle are within normal limits. The cerebral hemispheres demonstrate grossly normal gray-white differentiation. No mass effect or midline shift is seen.  There is no evidence of fracture; visualized osseous structures are unremarkable in appearance. The visualized portions of the orbits are within normal limits. The paranasal sinuses and mastoid air cells are well-aerated. No significant soft tissue abnormalities are seen.  IMPRESSION: 1. No acute intracranial pathology seen on CT. 2. Mild cortical volume loss and scattered small vessel ischemic microangiopathy. 3. 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   Electronically Signed   By: Garald Balding M.D.   On: 01/21/2015 20:24   Dg Foot  Complete Right  01/21/2015   CLINICAL DATA:  Known diabetic ulcer on foot with pain  EXAM: RIGHT FOOT COMPLETE - 3+ VIEW  COMPARISON:  02/07/2011  FINDINGS: Significant bony changes are noted consistent with the given clinical history with diabetic foot and multiple Charcot joints. These changes have progressed in the interval from the prior exam. No definitive bony erosion to suggest osteomyelitis is noted.  IMPRESSION: Severe changes in the ankle and foot secondary to diabetic neuropathy and Charcot  joint. No definitive erosion to suggest osteomyelitis is noted. MR would be more sensitive as clinically indicated.   Electronically Signed   By: Inez Catalina M.D.   On: 01/21/2015 20:42    LV EF: 55% -   60 Study Conclusions - Left ventricle: The cavity size was normal. Wall thickness was   increased in a pattern of mild LVH. Systolic function was normal.   The estimated ejection fraction was in the range of 55% to 60%.   Doppler parameters are consistent with abnormal left ventricular   relaxation (grade 1 diastolic dysfunction). - Mitral valve: There was mild regurgitation.   ASSESSMENT AND PLAN  Syncope- this is related to transient complete heart block. No chest pain. Troponins have been mildly elevated but flat, not consistent with ACS - although he does have a CAD history with PCI to the RCA in 2001  -- 2D ECHO w/ EF 55-60%, mild LVH, G1DD, mild MR.  -- Seizure like ad EEG normal recording during wakefulness. No evidence of an epileptic disorder was demonstrated. Likely related to CHB.   Complete heart block - temporary pacer placed. Documented non-reversible symptomatic bradycardia due to second degree and/or third degree atrioventricular block. Dr. Rayann Heman placed a Medtronic Advisa SR MRI Surescan single chamber pacemaker on 01/22/15. An atrial lead could not be successfully placed -- CXR with no signs of PTX. Device interrogated with normal functioning.   Uncontrolled HTN - started on  amlodipine 10 mg daily for resistant hypertension. AVN blockers initially held in the setting CHB ( he was not taking his home metorpolol anyway). Will add coreg in the setting CAD.   Diabetes 2, uncontrolled - HgA1C 13.4. Placed on SSI here. He has no PCP and is unaware that he is diabetic. He will need to go home on inuslin and oral hypoglycemics. Dr. Sheran Fava has taken over care for patient and will have education arranged and PCP set up.  CAD- s/p PCI with BMS x2 to [facility] (2001).  -- He was previously followed by Dr. Lattie Haw but has been lost to follow up for some time. I will have outpatient follow up in our [facility] office arranged.  -- Unclear why he is not on ASA, He also has not been taking his statin or BB although they are listed on his home medications. I am going to add a daily ASA, carvedilol 6.25mg  BID and atorva 80mg .   Chronic right foot osteomyelitis -  no evidence for active skin infection. Not currently on antibiotics, followed by the wound care center - Dr. Dellia Nims. Also, x-ray does not suggest osteomyelitis at this time.  History of Etoh abuse - on CIWA protocol. He plans to quit  Hypokalemia- will supplement before discharge.   Dispo- was going to discharge home today but he has uncontrolled DM and unaware of diagnosis. He will need to be started on a good regimen and provided education. For this reason, Dr. Sheran Fava with IM has taken over care of this patient. I have sent staff messages to Lorenda Hatchet to set up a wound check in 10 days and follow up with Dr. Rayann Heman in 3 months. I have also sent a message to office to have him set up to see Dr. Percival Spanish in [facility] for general cardiology follow up as he has a hx of CAD and has not been seen in years. Cardiology can likely sign off.   Judy Pimple PA-C  Pager [phone]  Agree with above  [place]  [facility]

## 2015-01-24 NOTE — Progress Notes (Signed)
[facility] PROGRESS NOTE  Nathan Miller ZOX:[id number] DOB: 10-07-1951 DOA: 01/21/2015 PCP: Terald Sleeper, MD  Brief Summary  Nathan Miller is a 64 y.o. male with history of CAD status post stenting, diabetes mellitus type 2, chronic wound of the right foot, hypertension who has not been taking any medications and has not visited his doctor for a long time was brought to the ER after patient was found to be unconscious in his garage.   On verbal stimuli patient regained his consciousness.  He had been incontinent of urine. Patient did not recall the incident leading to episode.  Per patient's wife, patient had had unexplained LOC 2 weeks and also 2 months PTA.  The episode two weeks ago was associated with a jerking spell like a seizure. At that time patient did not have any incontinence of urine but was confused.  In ER, patient was found to be bradycardic to the 30s with complete heart block and cardiology was consulted. Since patient had jerking spells concerning for seizures, cardiology also wanted to make sure patient did not have any seizure-like episode and further workup on seizure along with patient's complete heart block. CT head did not show anything acute and at this time. On exam patient is nonfocal. Patient has right foot chronic ulcer.  In the emergency department, he had none another episode of loss of consciousness secondary to complete heart block. He had P waves but no QRS for approximately 15 seconds. He spontaneously recovered and was going to be admitted to the stepdown unit/ICU per cardiology. He started transcutaneous pacing and was taken to the Cath Lab for temporary pacemaker insertion.  The intention was to place a dual-chamber pacemaker, however, the atrial lead was not able to be placed so he underwent Medtronic Avisa SR MRI Surescan single chamber pacemaker (model [id number]) insertion on 3/25 by Dr. Johney Frame.  The patient's possible seizures were associated with his  ventricular pauses and were likely related.  EEG demonstrated no epileptiform activity.  Likely, his seizures will stop now that his heart block has been addressed.  He was seen by PT who recommended HHPT and 3-in-1.  The patient was going to be discharged by the cardiology service on 3/27, however, he was told he would need to follow up with his primary care doctor for management of his diabetes (noted no his PMHx), however, the patient stated he did not know he had diabetes and did not have a primary care doctor.  Medicine was reconsulted for management of diabetes since he has required insulin and his A1c was 13.4.     Assessment/Plan  Syncope due to complete heart block now s/p single chamber PPM -  Troponins mildly elevated but flat -- 2D ECHO w/ EF 55-60%, mild LVH, G1DD, mild MR.  -- Seizure like activity likely related to CHB and should resolve s/p PPM insertion -  EEG was neg for epilep  Complete heart block - temporary pacer placed. Documented non-reversible symptomatic bradycardia due to second degree and/or third degree atrioventricular block. Dr. Johney Frame placed a Medtronic Advisa SR MRI Surescan single chamber pacemaker on 01/22/15. An atrial lead could not be successfully placed -- CXR with no signs of PTX. -  Device interrogated with normal functioning.   Uncontrolled HTN  - d/c amlodipine 10 mg -  Start lisinopril 10mg  daily given hx of diabetes - Started coreg 3/27  Diabetes 2, uncontrolled - HgA1C 13.4.  -  Diabetic education by RN -  Nutrition consultation -  Creatinine normal so will start metformin -  Plan to discharge on levemir 10 units  -  Will need Rx for glucometery  CAD- s/p PCI with BMS x2 to mRCA (2001).  -- He was previously followed by Dr. Dietrich Pates but has been lost to follow up for some time -  CArdiology has arranged for follow up -- Started on ASA, high dose statin, BB  Chronic right foot osteomyelitis - no evidence for active skin infection. ESR  2.   -  Followed by the wound care center - Dr. Leanord Hawking -  X-ray neg for osteomyelitis  -  Seen by wound care who recommended silver hydrofiber to be packed into the wound daily, cover with dry dressing, secure with conform or kerlix.  -  He will need to follow up with Dr. Leanord Hawking at the wound care center at the time of DC to home.   History of Etoh abuse - CIWA scores 0 -  D/c CIWA  -  Counseled cessation of alcohol  Hypokalemia  -  Given oral supplementation  Obesity -  Counseled weight loss  Diet:  diabetic Access:  PIV IVF:  off Proph:  scd  Code Status: full Family Communication: patient and wife Disposition Plan: likely home tomorrow after additional diabetes education.  Needs a local PCP   Consultants:  Cardiology  Procedures:  Transvenous pacer placed on 3/24  Single chamber PPM placed on 3/25  Antibiotics:  none   HPI/Subjective:  Feels well, has some pain in the left pocket, but not severe.     Objective: Filed Vitals:   01/23/15 2100 01/24/15 0000 01/24/15 0411 01/24/15 1026  BP: 153/93 119/79 126/84 145/77  Pulse: 72 53 67 63  Temp: 98.1 F (36.7 C)  98.1 F (36.7 C) 98.5 F (36.9 C)  TempSrc: Oral  Oral Oral  Resp: 18  16 16   Height:      Weight:   104.736 kg (230 lb 14.4 oz)   SpO2: 97%  98% 97%    Intake/Output Summary (Last 24 hours) at 01/24/15 1400 Last data filed at 01/24/15 0830  Gross per 24 hour  Intake    460 ml  Output    350 ml  Net    110 ml   Filed Weights   01/22/15 0040 01/24/15 0411  Weight: 104.9 kg (231 lb 4.2 oz) 104.736 kg (230 lb 14.4 oz)    Exam:   General:  Obese male, No acute distress  HEENT:  NCAT, MMM  Cardiovascular:  RRR, nl S1, S2 no mrg, 2+ pulses, warm extremities  Respiratory:  CTAB, no increased WOB  Abdomen:   NABS, soft, NT/ND  MSK:   Normal tone and bulk, no LEE, right foot bandaged  Neuro:  Grossly intact  Skin:  Left shoulder pocket incision c/d/i with steristrips, no discharge  or erythema  Data Reviewed: Basic Metabolic Panel:  Recent Labs Lab 01/21/15 1825 01/21/15 1843 01/22/15 0325 01/22/15 0345 01/23/15 0316  NA 137 138  --  137 135  K 4.4 4.6  --  4.5 3.3*  CL 103 101  --  105 99  CO2 19  --   --  23 28  GLUCOSE 417* 430*  --  199* 191*  BUN 9 12  --  9 8  CREATININE 1.12 1.00 0.85 0.88 0.81  CALCIUM 8.5  --   --  7.6* 8.5  MG 2.4  --   --   --   --  Liver Function Tests:  Recent Labs Lab 01/21/15 1825 01/22/15 0345  AST 65* 56*  ALT 52 45  ALKPHOS 84 72  BILITOT 0.4 1.1  PROT 6.6 5.5*  ALBUMIN 3.2* 2.8*   No results for input(s): LIPASE, AMYLASE in the last 168 hours. No results for input(s): AMMONIA in the last 168 hours. CBC:  Recent Labs Lab 01/21/15 1825 01/21/15 1843 01/22/15 0325 01/22/15 0345  WBC 10.4  --  8.1 DUPLICATE REQUEST  NEUTROABS 8.2*  --   --  4.9  HGB 16.5 18.4* 14.5 DUPLICATE REQUEST  HCT 50.6 54.0* 44.5 DUPLICATE REQUEST  MCV 97.5  --  97.2 DUPLICATE REQUEST  PLT 207  --  172 DUPLICATE REQUEST   Cardiac Enzymes:  Recent Labs Lab 01/21/15 1825 01/21/15 2152 01/22/15 0325 01/22/15 0900  CKTOTAL 67  --   --   --   TROPONINI  --  0.09* 0.09* 0.05*   BNP (last 3 results) No results for input(s): BNP in the last 8760 hours.  ProBNP (last 3 results) No results for input(s): PROBNP in the last 8760 hours.  CBG:  Recent Labs Lab 01/23/15 1133 01/23/15 1643 01/23/15 2124 01/24/15 0626 01/24/15 1132  GLUCAP 261* 155* 142* 187* 182*    Recent Results (from the past 240 hour(s))  MRSA PCR Screening     Status: Abnormal   Collection Time: 01/22/15 12:30 AM  Result Value Ref Range Status   MRSA by PCR POSITIVE (A) NEGATIVE Final    Comment:        The GeneXpert MRSA Assay (FDA approved for NASAL specimens only), is one component of a comprehensive MRSA colonization surveillance program. It is not intended to diagnose MRSA infection nor to guide or monitor treatment for MRSA  infections. RESULT CALLED TO, READ BACK BY AND VERIFIED WITH: [facility] RN AT 0230 ON [id number] SKEEN,P      Studies: Dg Chest 2 View  01/23/2015   CLINICAL DATA:  Status post cardiac device placement. Initial encounter.  EXAM: CHEST  2 VIEW  COMPARISON:  Chest radiograph performed 11/23/2011  FINDINGS: There has been interval placement of a left-sided pacemaker, with a single lead ending overlying the right ventricle.  Lucency at the lung apices likely reflects some degree of emphysema. Minimal bibasilar atelectasis is noted. No pleural effusion or pneumothorax is seen.  The cardiomediastinal silhouette is borderline normal in size. No acute osseous abnormalities are identified. Anterior bridging osteophytes are seen along the thoracic spine.  IMPRESSION: 1. Left-sided pacemaker noted, with a single lead ending overlying the right ventricle. 2. Lucency at the lung apices likely reflects some degree of emphysema. Minimal bibasilar atelectasis noted.   Electronically Signed   By: Roanna Raider M.D.   On: 01/23/2015 08:19    Scheduled Meds: . amLODipine  10 mg Oral Daily  . aspirin  81 mg Oral Once  . atorvastatin  80 mg Oral q1800  . carvedilol  6.25 mg Oral BID WC  . Chlorhexidine Gluconate Cloth  6 each Topical Q0600  . folic acid  1 mg Intravenous Daily   Or  . folic acid  1 mg Oral Daily  . insulin aspart  0-15 Units Subcutaneous TID WC  . insulin aspart  0-5 Units Subcutaneous QHS  . insulin glargine  10 Units Subcutaneous QHS  . multivitamin with minerals  1 tablet Oral Daily  . mupirocin ointment  1 application Nasal BID  . potassium chloride  40 mEq Oral Once  . sodium chloride  3 mL Intravenous Q12H  . thiamine  100 mg Oral Daily   Continuous Infusions:   Principal Problem:   Syncope Active Problems:   CAD S/P percutaneous coronary angioplasty; bms X 2 to RCA 2001   Bradycardia   Complete heart block   Right foot ulcer   Diabetes mellitus type 2, uncontrolled   2Nd degree  atrioventricular block   Faintness    Time spent: 30 min    Oluwatoni Rotunno  [facility] Pager [phone]. If 7PM-7AM, please contact night-coverage at [email or web address], password [facility] 01/24/2015, 2:00 PM  LOS: 3 days

## 2015-01-25 ENCOUNTER — Telehealth: Payer: Self-pay | Admitting: Family Medicine

## 2015-01-25 LAB — GLUCOSE, CAPILLARY
Glucose-Capillary: 130 mg/dL — ABNORMAL HIGH (ref 70–99)
Glucose-Capillary: 149 mg/dL — ABNORMAL HIGH (ref 70–99)
Glucose-Capillary: 126 mg/dL — ABNORMAL HIGH (ref 70–99)

## 2015-01-25 MED ORDER — INSULIN PEN NEEDLE 31G X 5 MM MISC: Status: DC

## 2015-01-25 MED ORDER — ATORVASTATIN CALCIUM 80 MG PO TABS: 80.0000 mg | ORAL_TABLET | Freq: Every day | ORAL | Status: DC

## 2015-01-25 MED ORDER — INSULIN STARTER KIT- PEN NEEDLES (ENGLISH)
1.0000 | Freq: Once | Status: AC
  Administered 2015-01-25: 1
  Filled 2015-01-25: qty 1

## 2015-01-25 MED ORDER — NITROGLYCERIN 0.4 MG SL SUBL: 0.4000 mg | SUBLINGUAL_TABLET | SUBLINGUAL | Status: DC | PRN

## 2015-01-25 MED ORDER — ASPIRIN EC 81 MG PO TBEC: 81.0000 mg | DELAYED_RELEASE_TABLET | Freq: Every day | ORAL | Status: AC

## 2015-01-25 MED ORDER — INSULIN ASPART 100 UNIT/ML FLEXPEN: 3.0000 [IU] | PEN_INJECTOR | Freq: Three times a day (TID) | SUBCUTANEOUS | Status: DC

## 2015-01-25 MED ORDER — LISINOPRIL 10 MG PO TABS: 10.0000 mg | ORAL_TABLET | Freq: Every day | ORAL | Status: DC

## 2015-01-25 MED ORDER — LIVING WELL WITH DIABETES BOOK
Freq: Once | Status: AC
  Administered 2015-01-25: 09:00:00
  Filled 2015-01-25: qty 1

## 2015-01-25 MED ORDER — METFORMIN HCL 500 MG PO TABS: 500.0000 mg | ORAL_TABLET | Freq: Two times a day (BID) | ORAL | Status: AC

## 2015-01-25 MED ORDER — CARVEDILOL 6.25 MG PO TABS: 6.2500 mg | ORAL_TABLET | Freq: Two times a day (BID) | ORAL | Status: DC

## 2015-01-25 MED ORDER — INSULIN GLARGINE 100 UNITS/ML SOLOSTAR PEN: 12.0000 [IU] | PEN_INJECTOR | Freq: Every day | SUBCUTANEOUS | Status: DC

## 2015-01-25 MED ORDER — BLOOD GLUCOSE METER KIT: PACK | Status: DC

## 2015-01-25 NOTE — Progress Notes (Signed)
Discharge instructions given, all questions answered, encouraged to call with any questions or concerns, wife at bedside during d/c instructions, patient discharged by wheelchair by NT

## 2015-01-25 NOTE — Progress Notes (Signed)
Physical Therapy Treatment Patient Details Name: Nathan Miller MRN: [id number] DOB: 09-Nov-1950 Today's Date: 01/25/2015    History of Present Illness Admitted after found down in driveway; has had multiple syncopal events in the past months; found to have 3rd degree heart block, and now s/p pacemaker placement    PT Comments    Patient is independent with mobility and gait.  Reviewed stair negotiation with patient and wife.  No further PT needs identified - PT will sign off.  No f/u PT needed at discharge.  Follow Up Recommendations  No PT follow up;Supervision - Intermittent     Equipment Recommendations  3in1 (PT)    Recommendations for Other Services OT consult     Precautions / Restrictions Precautions Precautions: ICD/Pacemaker Required Braces or Orthoses: Sling (LUE sling) Restrictions Weight Bearing Restrictions: No    Mobility  Bed Mobility Overal bed mobility: Independent             General bed mobility comments: Placed HOB flat and removed rail.  Patient able to move supine <> sit independently with LUE in sling.  Transfers Overall transfer level: Independent Equipment used: None Transfers: Sit to/from Stand Sit to Stand: Independent         General transfer comment: No physical assist required.  Patient uses RUE appropriately on armrest of chair and on bed to move sit > stand.  Good balance in standing.  Ambulation/Gait Ambulation/Gait assistance: Independent Ambulation Distance (Feet): 80 Feet Assistive device: None Gait Pattern/deviations: Step-through pattern;Decreased stride length Gait velocity: WFL Gait velocity interpretation: at or above normal speed for age/gender General Gait Details: Good balance with gait.  No physical assist required.   Stairs Stairs:  (Verbally reviewed proper step-to technique with 1 rail)          Wheelchair Mobility    Modified Rankin (Stroke Patients Only)       Balance Overall balance  assessment: Independent                                  Cognition Arousal/Alertness: Awake/alert Behavior During Therapy: WFL for tasks assessed/performed Overall Cognitive Status: Within Functional Limits for tasks assessed                      Exercises      General Comments        Pertinent Vitals/Pain Pain Assessment: 0-10 Pain Score: 3  Pain Location: Lt shoulder Pain Descriptors / Indicators: Sore Pain Intervention(s): Monitored during session;Repositioned    Home Living                      Prior Function            PT Goals (current goals can now be found in the care plan section) Progress towards PT goals: Goals met/education completed, patient discharged from PT    Frequency  Min 3X/week    PT Plan Discharge plan needs to be updated    Co-evaluation             End of Session Equipment Utilized During Treatment: Other (comment) (Sling LUE) Activity Tolerance: Patient tolerated treatment well Patient left: in chair;with call bell/phone within reach;with family/visitor present     Time: 7829-5621 PT Time Calculation (min) (ACUTE ONLY): 10 min  Charges:  $Therapeutic Activity: 8-22 mins  G Codes:      Vena Austria 01/25/2015, 1:19 PM Durenda Hurt. Renaldo Fiddler, [facility] Acute Rehab Services Pager [phone]

## 2015-01-25 NOTE — Progress Notes (Signed)
Consult Note:   Saw patient due to A1c level 13.4 %. Spoke with pt about diabetes diagnosis. In chart review saw where patient saw Dr Blanchie Serve 01/2013 and patient was diagnosed with DM with an A1c of 6.5%. Patient mentioned that she was his PCP and they had mentioned prediabetes at that time and that was it. Patient discussed how he will reestablish PCP at either [facility] or [facility]. Discussed A1C results and explained what an A1C is, basic pathophysiology of DM Type 2, basic home care, importance of checking CBGs and maintaining good CBG control to prevent long-term and short-term complications. Reviewed signs and symptoms of hyperglycemia and hypoglycemia along with treatment for both. Discussed impact of nutrition, exercise, stress, sickness, and medications on diabetes control. Briefly discussed carbohydrates, carbohydrate goals per day and meal, along with portion sizes. RNs to provide ongoing basic DM education at bedside with this patient and engage patient to actively check blood glucose and administer insulin injections. Have ordered educational booklet, insulin pen starter kit, and DM videos. Patient verbalized understanding of information discussed and he states that he has no further questions at this time related to diabetes.   Educated patient and spouse on insulin pen use at home. Reviewed contents of insulin flexpen starter kit. Reviewed all steps if insulin pen including attachment of needle, 2-unit air shot, dialing up dose, giving injection, removing needle, disposal of sharps, storage of unused insulin, disposal of insulin etc. Patient able to provide successful return demonstration. Also reviewed troubleshooting with insulin pen.   Unsure if patient will be discharged on Lantus and Metformin only or if Novolog will be added as well. MD to assess.  Insurance: Medicare  MD: At discharge, patient will need glucose meter kit (order # [id number]), Order for Lantus Solostar insulin pen  and insulin pen needles (order # [id number]).   Thanks,  Tama Headings RN, MSN, [facility] Inpatient Diabetes Coordinator Team Pager [phone]

## 2015-01-25 NOTE — Progress Notes (Signed)
  RD consulted for nutrition education regarding diabetes.   Lab Results  Component Value Date   HGBA1C 13.4* 01/22/2015    RD provided "Carbohydrate Counting for People with Diabetes" handout from the Academy of Nutrition and Dietetics. Discussed different food groups and their effects on blood sugar, emphasizing carbohydrate-containing foods. Provided list of carbohydrates and recommended serving sizes of common foods. Provide list of  25 healthy snacks.   Discussed importance of controlled and consistent carbohydrate intake throughout the day. Provided examples of ways to balance meals/snacks and encouraged intake of high-fiber, whole grain complex carbohydrates. Encouraged intake of fresh fruits and vegetables and lean meats, low fat dairy. Teach back method used.  Expect good compliance. Pt reports drinking 4-5 beers daily and drinking lots of milk; he plans to quit drinking alcohol, switch to low fat milk and limit milk to 3 cups daily.   Body mass index is 36.95 kg/(m^2). Pt meets criteria for Obesity based on current BMI.  Current diet order is Heart Healthy/Carb Modified, patient is consuming approximately 25 to 100% of meals at this time. Labs and medications reviewed. No further nutrition interventions warranted at this time. RD contact information provided. If additional nutrition issues arise, please re-consult RD.  Pryor Ochoa RD, LDN Inpatient Clinical Dietitian Pager: [phone] After Hours Pager: [phone]

## 2015-01-25 NOTE — Discharge Summary (Signed)
Physician Discharge Summary  [name]:[id number] DOB: 04-23-1951 DOA: 01/21/2015  PCP: Terald Sleeper, MD  Admit date: 01/21/2015 Discharge date: 01/25/2015  Recommendations for Outpatient Follow-up:  1. Follow up with primary care doctor within 2 weeks of discharge to review diabetes management and blood sugars.  Routine diabetes management (screening for neuropathy, urine proteinuria screening, ophth referral) 2. F/u with Dr. Johney Frame in 3 months for PPM maintenance 3. F/u with Dr. 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 male with history of CAD status post stenting, diabetes mellitus type 2, chronic wound of the right foot, hypertension who has not been taking any medications and has not visited his doctor for a long time was brought to the ER after patient was found to be unconscious in his garage. On verbal stimuli patient regained his consciousness. He had been incontinent of urine. Patient did not recall the incident leading to episode. Per patient's wife, patient had had unexplained LOC 2 weeks and also 2 months PTA. The episode two weeks ago was associated with a jerking spell like a seizure. At that time patient did not have any incontinence of urine but was confused. In ER, patient was found to be bradycardic to the 30s with complete heart block and cardiology was consulted. Since patient had  jerking spells concerning for seizures, cardiology also wanted to make sure patient did not have any seizure-like episode and further workup on seizure along with patient's complete heart block. CT head did not show anything acute and at this time. On exam patient is nonfocal. Patient has right foot chronic ulcer. In the emergency department, he had none another episode of loss of consciousness secondary to complete heart block. He had P waves but no QRS for approximately 15 seconds. He spontaneously recovered and was going to be admitted to the stepdown unit/ICU per cardiology. He started transcutaneous pacing and was taken to the Cath Lab for temporary pacemaker insertion. The intention was to place a dual-chamber pacemaker, however, the atrial lead was not able to be placed so he underwent pacemaker insertion on 3/25 by Dr. Johney Frame. The patient's possible seizures were associated with his ventricular pauses and were likely related. EEG demonstrated no epileptiform activity. Likely, his seizures will stop now that his heart block has been addressed. He was seen by PT who recommended HHPT and 3-in-1. The patient was going to be discharged by the cardiology service on 3/27, however, he was told he would need to follow up with his primary care doctor for management of his diabetes (noted no his PMHx), however, the patient stated he did not know he had diabetes and did not have a primary care doctor. Medicine was reconsulted for management of diabetes since he has required insulin and his A1c was 13.4.   Hospital Course:   Syncope due to complete heart block  now s/p single chamber PPM - Troponins mildly elevated but flat -- 2D ECHO w/ EF 55-60%, mild LVH, G1DD, mild MR.  -- Seizure like activity likely related to CHB and should resolve s/p PPM insertion - EEG was neg for epilep   Complete heart block with documented non-reversible symptomatic bradycardia due to second degree and third degree  atrioventricular block.  -  Dr. Johney Frame placed a Medtronic Avisa SR MRI Surescan single chamber pacemaker (model [id number])  single chamber pacemaker on 01/22/15. An atrial lead could not be successfully placed. -- CXR with no signs of PTX. - Device interrogated with normal functioning.   Uncontrolled HTN -  Initially started on amlodipine 10 mg, however this was discontinued to start an ACE inhibitor in the setting of diabetes - Start lisinopril 10mg  daily - Started coreg 3/27 -   Will need repeat BMP in 1 week  Diabetes 2, uncontrolled - HgA1C 13.4.  -  Patient met with the diabetic educator and received training on insulin pen injections, checking CBGs , diabetic diet. The nurse also reviewed injections, CBGs with the patient and the patient demonstrated correct procedure for both.   -  Cousin is markedly elevated hemoglobin A1c he will be discharged on insulin - Plan to discharge on  Lantus and aspart plus metformin -  given Rx for glucometer -   Patient will need to document his CBGs and bring the list with him to his primary care doctor's appointment  CAD- s/p PCI with BMS x2 to mRCA (2001).  -- He was previously followed by Dr. Dietrich Pates but has been lost to follow up for some time - [facility] has arranged for follow up -- Started on ASA, high dose statin, BB  Chronic right foot osteomyelitis - no evidence for active skin infection. ESR 2.  - Followed by the wound care center - Dr. Leanord Hawking - X-ray neg for osteomyelitis  - Seen by wound care who recommended silver hydrofiber to be packed into the wound daily, cover with dry dressing, secure with conform or kerlix.  - He will need to follow up with Dr. Leanord Hawking at the wound care center at the time of DC to home.   History of Etoh abuse - CIWA scores 0 - Counseled cessation of alcohol  Hypokalemia  - Given oral supplementation  Obesity - Counseled weight loss  Consultants:  Cardiology, Dr.  Johney Frame  Procedures:  Transvenous pacer placed on 3/24  Single chamber PPM placed on 3/25  Antibiotics:  none Discharge Exam: Filed Vitals:   01/25/15 1207  BP: 98/68  Pulse: 67  Temp:   Resp:    Filed Vitals:   01/24/15 2052 01/25/15 0507 01/25/15 0624 01/25/15 1207  BP: 134/51 107/58 103/60 98/68  Pulse: 77 56 66 67  Temp: 98.7 F (37.1 C) 98.3 F (36.8 C)    TempSrc: Oral Oral    Resp: 16 16    Height:      Weight:  103.783 kg (228 lb 12.8 oz)    SpO2: 97% 95%       General: Obese male, No acute distress, stable from yesterday   HEENT: NCAT, MMM  Cardiovascular: RRR, nl S1, S2 no mrg, 2+ pulses, warm extremities  Respiratory: CTAB, no increased WOB  Abdomen: NABS, soft, NT/ND  MSK: Normal tone and bulk, no LEE, right foot bandaged  Neuro: Grossly intact  Skin: Left shoulder pocket incision c/d/i with steristrips, no discharge or erythema  Discharge Instructions      Discharge  Instructions    Call MD for:  difficulty breathing, headache or visual disturbances    Complete by:  As directed      Call MD for:  extreme fatigue    Complete by:  As directed      Call MD for:  hives    Complete by:  As directed      Call MD for:  persistant dizziness or light-headedness    Complete by:  As directed      Call MD for:  persistant nausea and vomiting    Complete by:  As directed      Call MD for:  redness, tenderness, or signs of infection (pain, swelling, redness, odor or green/yellow discharge around incision site)    Complete by:  As directed      Call MD for:  severe uncontrolled pain    Complete by:  As directed      Call MD for:  temperature >100.4    Complete by:  As directed      Diet - low sodium heart healthy    Complete by:  As directed      Diet Carb Modified    Complete by:  As directed      Discharge instructions    Complete by:  As directed   You were hospitalized with complete heart block, a dangerous heart rhythm problem,  however, you had a pacemaker inserted.  Your fainting and seizure-like episodes should stop.  If you have any similar episodes, please call 911 immediately.  For your diabetes, you need to see a primary care doctor within 2 weeks.  Please check your blood sugars before breakfast, lunch, dinner, and before bed.  Record the numbers and bring the numbers with you to your appointment.  If your numbers are consistently greater than 180, please call your doctor sooner.  If you have any blood sugar less than 70, please stop your insulin altogether, drink 4-ounces of juice, and recheck your blood sugar in 15 minutes.  If it is still low, please drink 4 more ounces of juice and check your blood sugar 15 minutes later.  If you have a blood sugar less than 70, after you have corrected your blood sugar to greater than 100, please call your doctor's office for further instructions.  For your heart, please take aspirin 81mg  per day, carvedilol, atorvastatin, and lisinopril.  If you have severe chest pain with shortness of breath, please call 911.     Increase activity slowly    Complete by:  As directed             Medication List    STOP taking these medications        metoprolol succinate 12.5 mg Tb24 24 hr tablet  Commonly known as:  TOPROL-XL     potassium chloride 10 MEQ tablet  Commonly known as:  K-DUR      TAKE these medications        aspirin EC 81 MG tablet  Take 1 tablet (81 mg total) by mouth daily.     atorvastatin 80 MG tablet  Commonly known as:  LIPITOR  Take 1 tablet (80 mg total) by mouth daily at 6 PM.     blood glucose meter kit and supplies  Dispense based on patient and insurance preference. Use up to four times daily as directed. (FOR ICD-9 250.00, 250.01).     carvedilol 6.25 MG tablet  Commonly known as:  COREG  Take 1 tablet (  6.25 mg total) by mouth 2 (two) times daily with a meal.     insulin aspart 100 UNIT/ML FlexPen  Commonly known as:  NOVOLOG  Inject 3 Units  into the skin 3 (three) times daily with meals.     insulin glargine 100 unit/mL Sopn  Commonly known as:  LANTUS  Inject 0.12 mLs (12 Units total) into the skin at bedtime.     Insulin Pen Needle 31G X 5 MM Misc  Use four times daily with insulin injections     lisinopril 10 MG tablet  Commonly known as:  PRINIVIL,ZESTRIL  Take 1 tablet (10 mg total) by mouth daily.     metFORMIN 500 MG tablet  Commonly known as:  GLUCOPHAGE  Take 1 tablet (500 mg total) by mouth 2 (two) times daily with a meal.     nitroGLYCERIN 0.4 MG SL tablet  Commonly known as:  NITROSTAT  Place 1 tablet (0.4 mg total) under the tongue every 5 (five) minutes as needed for chest pain.       Follow-up Information    Follow up with Rollene Rotunda, MD.   Specialty:  Cardiology   Why:  The office will call you to make an appoinment., If you do not hear from them, please contact them., You should be seen within 1-2 weeks.   Contact information:   [address] [place] [address] [phone]       Follow up with Hillis Range, MD.   Specialty:  Cardiology   Why:  The office will call you to make an appoinment., If you do not hear from them, please contact them., You should be seen in 10 days for a wound check and then in 3 months with Dr. Johney Frame to check on your pacemaker.    Contact information:   [address] [place] [address] [phone]       Follow up with Terald Sleeper, MD In 1 month.   Specialty:  Internal Medicine   Contact information:   [address] [place] [address] [phone]       Follow up with Primary care doctor. Schedule an appointment as soon as possible for a visit in 2 weeks.       The results of significant diagnostics from this hospitalization (including imaging, microbiology, ancillary and laboratory) are listed below for reference.    Significant Diagnostic Studies: Dg Chest 2 View  01/23/2015   CLINICAL DATA:  Status post cardiac  device placement. Initial encounter.  EXAM: CHEST  2 VIEW  COMPARISON:  Chest radiograph performed 11/23/2011  FINDINGS: There has been interval placement of a left-sided pacemaker, with a single lead ending overlying the right ventricle.  Lucency at the lung apices likely reflects some degree of emphysema. Minimal bibasilar atelectasis is noted. No pleural effusion or pneumothorax is seen.  The cardiomediastinal silhouette is borderline normal in size. No acute osseous abnormalities are identified. Anterior bridging osteophytes are seen along the thoracic spine.  IMPRESSION: 1. Left-sided pacemaker noted, with a single lead ending overlying the right ventricle. 2. Lucency at the lung apices likely reflects some degree of emphysema. Minimal bibasilar atelectasis noted.   Electronically Signed   By: Roanna Raider M.D.   On: 01/23/2015 08:19   Ct Head Wo Contrast  01/21/2015   CLINICAL DATA:  Found on floor, with incontinence. Question of seizure. Bradycardia. Initial encounter.  EXAM: CT HEAD WITHOUT CONTRAST  TECHNIQUE: Contiguous axial images were obtained from the  base of the skull through the vertex without intravenous contrast.  COMPARISON:  None.  FINDINGS: There is no evidence of acute infarction, mass lesion, or intra- or extra-axial hemorrhage on CT.  Prominence of the ventricles and sulci suggests mild cortical loss. Cerebellar atrophy is noted. Mild periventricular and subcortical white matter change likely reflects small vessel ischemic microangiopathy. A small chronic lacunar infarct is seen at the right mid corona radiata. A small chronic lacunar infarct is also seen at the right basal ganglia.  The brainstem and fourth ventricle are within normal limits. The cerebral hemispheres demonstrate grossly normal gray-white differentiation. No mass effect or midline shift is seen.  There is no evidence of fracture; visualized osseous structures are unremarkable in appearance. The visualized portions of the  orbits are within normal limits. The paranasal sinuses and mastoid air cells are well-aerated. No significant soft tissue abnormalities are seen.  IMPRESSION: 1. No acute intracranial pathology seen on CT. 2. Mild cortical volume loss and scattered small vessel ischemic microangiopathy. 3. 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   Electronically Signed   By: Roanna Raider M.D.   On: 01/21/2015 20:24   Dg Foot Complete Right  01/21/2015   CLINICAL DATA:  Known diabetic ulcer on foot with pain  EXAM: RIGHT FOOT COMPLETE - 3+ VIEW  COMPARISON:  02/07/2011  FINDINGS: Significant bony changes are noted consistent with the given clinical history with diabetic foot and multiple Charcot joints. These changes have progressed in the interval from the prior exam. No definitive bony erosion to suggest osteomyelitis is noted.  IMPRESSION: Severe changes in the ankle and foot secondary to diabetic neuropathy and Charcot joint. No definitive erosion to suggest osteomyelitis is noted. MR would be more sensitive as clinically indicated.   Electronically Signed   By: Alcide Clever M.D.   On: 01/21/2015 20:42    Microbiology: Recent Results (from the past 240 hour(s))  MRSA PCR Screening     Status: Abnormal   Collection Time: 01/22/15 12:30 AM  Result Value Ref Range Status   MRSA by PCR POSITIVE (A) NEGATIVE Final    Comment:        The GeneXpert MRSA Assay (FDA approved for NASAL specimens only), is one component of a comprehensive MRSA colonization surveillance program. It is not intended to diagnose MRSA infection nor to guide or monitor treatment for MRSA infections. RESULT CALLED TO, READ BACK BY AND VERIFIED WITH: [facility] RN AT 0230 ON [id number] SKEEN,P      Labs: Basic Metabolic Panel:  Recent Labs Lab 01/21/15 1825 01/21/15 1843 01/22/15 0325 01/22/15 0345 01/23/15 0316 01/24/15 1445  NA 137 138  --  137 135 133*  K 4.4 4.6  --  4.5 3.3*  3.6  CL 103 101  --  105 99 94*  CO2 19  --   --  23 28 30   GLUCOSE 417* 430*  --  199* 191* 151*  BUN 9 12  --  9 8 9   CREATININE 1.12 1.00 0.85 0.88 0.81 0.80  CALCIUM 8.5  --   --  7.6* 8.5 8.9  MG 2.4  --   --   --   --   --    Liver Function Tests:  Recent Labs Lab 01/21/15 1825 01/22/15 0345  AST 65* 56*  ALT 52 45  ALKPHOS 84 72  BILITOT 0.4 1.1  PROT 6.6 5.5*  ALBUMIN 3.2* 2.8*   No results for  input(s): LIPASE, AMYLASE in the last 168 hours. No results for input(s): AMMONIA in the last 168 hours. CBC:  Recent Labs Lab 01/21/15 1825 01/21/15 1843 01/22/15 0325 01/22/15 0345 01/24/15 1445  WBC 10.4  --  8.1 DUPLICATE REQUEST 8.5  NEUTROABS 8.2*  --   --  4.9  --   HGB 16.5 18.4* 14.5 DUPLICATE REQUEST 15.8  HCT 50.6 54.0* 44.5 DUPLICATE REQUEST 47.2  MCV 97.5  --  97.2 DUPLICATE REQUEST 96.5  PLT 207  --  172 DUPLICATE REQUEST 195   Cardiac Enzymes:  Recent Labs Lab 01/21/15 1825 01/21/15 2152 01/22/15 0325 01/22/15 0900  CKTOTAL 67  --   --   --   TROPONINI  --  0.09* 0.09* 0.05*   BNP: BNP (last 3 results) No results for input(s): BNP in the last 8760 hours.  ProBNP (last 3 results) No results for input(s): PROBNP in the last 8760 hours.  CBG:  Recent Labs Lab 01/24/15 1132 01/24/15 1627 01/24/15 2058 01/25/15 0559 01/25/15 1106  GLUCAP 182* 151* 159* 130* 149*    Time coordinating discharge: 35 minutes  Signed:  Murphy Duzan  [facility] 01/25/2015, 1:40 PM

## 2015-01-25 NOTE — Telephone Encounter (Signed)
Patients wife aware that the old debt of $528.25 would have to be paid in full per [facility] in billing for a bad debt in 92-97. Patients wife said she will try and get him an appointment somewhere else first.

## 2015-01-25 NOTE — Evaluation (Signed)
Occupational Therapy Evaluation Patient Details Name: Nathan Miller MRN: [id number] DOB: 12-28-1950 Today's Date: 01/25/2015    History of Present Illness Admitted after found down in driveway; has had multiple syncopal events in the past months; found to have 3rd degree heart block, and now s/p pacemaker placement   Clinical Impression   Pt currently independent to modified independent level for selfcare tasks at this time.  He currently has a sling on the LUE but is able to use the hand functionally.  Will have assistance from wife at discharge.  No further OT needs at this time or DME.    Follow Up Recommendations  No OT follow up    Equipment Recommendations  None recommended by OT       Precautions / Restrictions Precautions Precautions: ICD/Pacemaker Required Braces or Orthoses: Sling Restrictions Weight Bearing Restrictions: No      Mobility Bed Mobility Overal bed mobility: Independent             General bed mobility comments: Placed HOB flat and removed rail.  Patient able to move supine <> sit independently with LUE in sling.  Transfers Overall transfer level: Independent Equipment used: None Transfers: Sit to/from Stand Sit to Stand: Independent         General transfer comment: No physical assist required.  Patient uses RUE appropriately on armrest of chair and on bed to move sit > stand.  Good balance in standing.    Balance Overall balance assessment: Independent                                          ADL Overall ADL's : At baseline                                       General ADL Comments: Pt currently modified independent for mobility and simulated selfcare tasks.  Will have assistance from wife at discharge.  Pt still in sling discussed the need to get clarification from MD on how longer sling needed to be worn.  Pt reports that his son is going to put him in a grab bar next to the toilet as well.       Vision Vision Assessment?: No apparent visual deficits   Perception Perception Perception Tested?: No   Praxis Praxis Praxis tested?: Not tested    Pertinent Vitals/Pain Pain Assessment: Faces Pain Score: 3  Faces Pain Scale: Hurts a little bit Pain Location: left shoulder Pain Descriptors / Indicators: Sore Pain Intervention(s): Monitored during session;Repositioned     Hand Dominance Right   Extremity/Trunk Assessment Upper Extremity Assessment Upper Extremity Assessment: LUE deficits/detail LUE Deficits / Details: Pt with AROM WFLS for hand with pt currently in sling secondary to pacemaker placement LUE: Unable to fully assess due to immobilization   Lower Extremity Assessment Lower Extremity Assessment: Defer to PT evaluation   Cervical / Trunk Assessment Cervical / Trunk Assessment: Normal   Communication Communication Communication: No difficulties   Cognition Arousal/Alertness: Awake/alert Behavior During Therapy: WFL for tasks assessed/performed Overall Cognitive Status: Within Functional Limits for tasks assessed                                Home Living Family/patient expects to be discharged  to:: Private residence Living Arrangements: Spouse/significant other Available Help at Discharge: Family;Available 24 hours/day Type of Home: House Home Access: Stairs to enter [employer] of Steps: 4 Entrance Stairs-Rails: Right Home Layout: One level     Bathroom Shower/Tub: Producer, television/film/video: Standard     Home Equipment: Environmental consultant - 2 wheels   Additional Comments: has a custom fit shoe for R foot due to wound      Prior Functioning/Environment Level of Independence: Needs assistance    ADL's / Homemaking Assistance Needed: Family assist prn                                  End of Session Equipment Utilized During Treatment: Other (comment) (sling on the LUE) Nurse Communication: Mobility  status  Activity Tolerance:   Patient left: in chair;with call bell/phone within reach;with family/visitor present   Time: 1343-1402 OT Time Calculation (min): 19 min Charges:  OT General Charges $OT Visit: 1 Procedure OT Evaluation $Initial OT Evaluation Tier I: 1 Procedure  Nathan Miller OTR/L 01/25/2015, 2:13 PM

## 2015-01-28 DIAGNOSIS — L97419 Non-pressure chronic ulcer of right heel and midfoot with unspecified severity: Secondary | ICD-10-CM | POA: Diagnosis not present

## 2015-01-28 DIAGNOSIS — M868X7 Other osteomyelitis, ankle and foot: Secondary | ICD-10-CM | POA: Diagnosis not present

## 2015-02-03 ENCOUNTER — Ambulatory Visit: Payer: Medicare Other | Admitting: *Deleted

## 2015-02-03 LAB — MDC_IDC_ENUM_SESS_TYPE_INCLINIC
Battery Voltage: 3.05 V
Brady Statistic RV Percent Paced: 54.71 %
Date Time Interrogation Session: 20160406173208
Lead Channel Impedance Value: 608 Ohm
Lead Channel Impedance Value: 722 Ohm
Lead Channel Pacing Threshold Amplitude: 0.75 V
Lead Channel Pacing Threshold Pulse Width: 0.4 ms
Lead Channel Sensing Intrinsic Amplitude: 10.5 mV
Lead Channel Setting Pacing Amplitude: 3.5 V
Lead Channel Setting Pacing Pulse Width: 0.4 ms
Lead Channel Setting Sensing Sensitivity: 0.9 mV
Zone Setting Detection Interval: 360 ms

## 2015-02-04 ENCOUNTER — Ambulatory Visit: Payer: Medicare Other

## 2015-02-17 ENCOUNTER — Encounter: Payer: Self-pay | Admitting: Internal Medicine

## 2015-02-25 ENCOUNTER — Encounter (HOSPITAL_BASED_OUTPATIENT_CLINIC_OR_DEPARTMENT_OTHER): Payer: Medicare Other

## 2015-03-04 ENCOUNTER — Encounter (HOSPITAL_BASED_OUTPATIENT_CLINIC_OR_DEPARTMENT_OTHER): Payer: Medicare Other | Attending: Internal Medicine

## 2015-03-04 DIAGNOSIS — L97511 Non-pressure chronic ulcer of other part of right foot limited to breakdown of skin: Secondary | ICD-10-CM | POA: Insufficient documentation

## 2015-03-04 DIAGNOSIS — M86471 Chronic osteomyelitis with draining sinus, right ankle and foot: Secondary | ICD-10-CM | POA: Insufficient documentation

## 2015-03-04 DIAGNOSIS — Z794 Long term (current) use of insulin: Secondary | ICD-10-CM | POA: Insufficient documentation

## 2015-03-04 DIAGNOSIS — E11610 Type 2 diabetes mellitus with diabetic neuropathic arthropathy: Secondary | ICD-10-CM | POA: Insufficient documentation

## 2015-03-04 DIAGNOSIS — E1151 Type 2 diabetes mellitus with diabetic peripheral angiopathy without gangrene: Secondary | ICD-10-CM | POA: Insufficient documentation

## 2015-03-04 DIAGNOSIS — I739 Peripheral vascular disease, unspecified: Secondary | ICD-10-CM | POA: Insufficient documentation

## 2015-03-04 DIAGNOSIS — B9689 Other specified bacterial agents as the cause of diseases classified elsewhere: Secondary | ICD-10-CM | POA: Insufficient documentation

## 2015-03-10 ENCOUNTER — Encounter: Payer: Self-pay | Admitting: Cardiology

## 2015-03-10 ENCOUNTER — Ambulatory Visit (INDEPENDENT_AMBULATORY_CARE_PROVIDER_SITE_OTHER): Payer: Medicare Other | Admitting: Cardiology

## 2015-03-10 VITALS — BP 138/82 | HR 52 | Ht 66.0 in | Wt 216.0 lb

## 2015-03-10 DIAGNOSIS — I10 Essential (primary) hypertension: Secondary | ICD-10-CM

## 2015-03-10 DIAGNOSIS — I442 Atrioventricular block, complete: Secondary | ICD-10-CM

## 2015-03-10 DIAGNOSIS — I2510 Atherosclerotic heart disease of native coronary artery without angina pectoris: Secondary | ICD-10-CM | POA: Diagnosis not present

## 2015-03-10 DIAGNOSIS — Z9861 Coronary angioplasty status: Secondary | ICD-10-CM

## 2015-03-10 NOTE — Progress Notes (Signed)
Cardiology Office Note   Date:  03/10/2015   ID:  Nathan Miller, DOB 04-27-1951, MRN [id number]  PCP:  Rose Fillers, PA-C  Cardiologist:   Rollene Rotunda, MD   No chief complaint on file.     History of Present Illness: Nathan Miller is a 64 y.o. male who presents for hospital follow-up. He was admitted with syncope and possible seizures. He was found to be in complete heart block in late March. He ultimately underwent pacemaker placement. This was intended to be dual-chamber atrial lead could not be placed. Further workup if his seizure included a negative EEG and this was thought probably that his event was related to asystole. The patient does have a history of coronary artery disease but has not been compliant with follow-up over the years. He did have a very mild troponin elevation but no active ischemic symptoms. No ischemia workup was suggested. His EF was well-preserved. His diabetes was not controlled with his A1c being 13.4. He was hypertensive and was started on an ACE inhibitor. He is now here for his first visit with me.     Since getting out of the hospital he's done much better and has been compliant with medications and he has seen his primary provider. He said his sugars are well controlled. He's had no further syncope or seizure activity. The patient denies any new symptoms such as chest discomfort, neck or arm discomfort. There has been no new shortness of breath, PND or orthopnea. There have been no reported palpitations, presyncope. , Well-healed pacemaker pocket  Past Medical History  Diagnosis Date  . Osteomyelitis   . Diabetes mellitus   . CAD (coronary artery disease)     NSTEMI in 2001. Cardiac cath showed: LAD: 40% proximal, LCX: 70% mid, RCA thrombotic occlusion in mid segment. s/p PCI and 2 overlapped BMSs to RCA.   [name] Hypertension     Past Surgical History  Procedure Laterality Date  . Cardiac catheterization    . Coronary angioplasty    .  Temporary pacemaker insertion N/A 01/21/2015    Procedure: TEMPORARY PACEMAKER INSERTION;  Surgeon: Marykay Lex, MD;  Location: [facility] CATH LAB;  Service: Cardiovascular;  Laterality: N/A;  . Permanent pacemaker insertion N/A 01/22/2015    Procedure: PERMANENT PACEMAKER INSERTION;  Surgeon: Hillis Range, MD;  Location: [facility] CATH LAB;  Service: Cardiovascular;  Laterality: N/A;     Current Outpatient Prescriptions  Medication Sig Dispense Refill  . aspirin EC 81 MG tablet Take 1 tablet (81 mg total) by mouth daily. 30 tablet 0  . atorvastatin (LIPITOR) 80 MG tablet Take 1 tablet (80 mg total) by mouth daily at 6 PM. 30 tablet 0  . carvedilol (COREG) 6.25 MG tablet Take 1 tablet (6.25 mg total) by mouth 2 (two) times daily with a meal. 60 tablet 0  . insulin aspart (NOVOLOG) 100 UNIT/ML FlexPen Inject 3 Units into the skin 3 (three) times daily with meals. 15 mL 0  . insulin glargine (LANTUS) 100 unit/mL SOPN Inject 0.12 mLs (12 Units total) into the skin at bedtime. 15 mL 0  . lisinopril (PRINIVIL,ZESTRIL) 10 MG tablet Take 1 tablet (10 mg total) by mouth daily. 30 tablet 0  . metFORMIN (GLUCOPHAGE) 500 MG tablet Take 1 tablet (500 mg total) by mouth 2 (two) times daily with a meal. 60 tablet 0  . nitroGLYCERIN (NITROSTAT) 0.4 MG SL tablet Place 1 tablet (0.4 mg total) under the tongue every 5 (five) minutes  as needed for chest pain. 20 tablet 0  . blood glucose meter kit and supplies Dispense based on patient and insurance preference. Use up to four times daily as directed. (FOR ICD-9 250.00, 250.01). 1 each 0  . Insulin Pen Needle 31G X 5 MM MISC Use four times daily with insulin injections 200 each 0   No current facility-administered medications for this visit.    Allergies:   Penicillins    ROS:  Please see the history of present illness.   Otherwise, review of systems are positive for none.   All other systems are reviewed and negative.    PHYSICAL EXAM: VS:  BP 138/82 mmHg  Pulse 52   Ht 5\' 6"  (1.676 m)  Wt 216 lb (97.977 kg)  BMI 34.88 kg/m2 , BMI Body mass index is 34.88 kg/(m^2). GENERAL:  Well appearing HEENT:  Pupils equal round and reactive, fundi not visualized, oral mucosa unremarkable NECK:  No jugular venous distention, waveform within normal limits, carotid upstroke brisk and symmetric, no bruits, no thyromegaly LYMPHATICS:  No cervical, inguinal adenopathy LUNGS:  Clear to auscultation bilaterally BACK:  No CVA tenderness CHEST:  Unremarkable HEART:  PMI not displaced or sustained,S1 and S2 within normal limits, no S3, no S4, no clicks, no rubs, , obesemurmurs ABD:  Flat, positive bowel sounds normal in frequency in pitch, no bruits, no rebound, no guarding, no midline pulsatile mass, no hepatomegaly, no splenomegaly EXT:  2 plus pulses throughout, no edema, no cyanosis no clubbing, 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    Recent Labs: 01/21/2015: Magnesium 2.4; TSH 2.244 01/22/2015: ALT 45 01/24/2015: BUN 9; Creatinine 0.80; Hemoglobin 15.8; Platelets 195; Potassium 3.6; Sodium 133*      Wt Readings from Last 3 Encounters:  03/10/15 216 lb (97.977 kg)  01/25/15 228 lb 12.8 oz (103.783 kg)  02/11/13 243 lb (110.224 kg)      Other studies Reviewed: Additional studies/ records that were reviewed today include: Hospital records. Review of the above records demonstrates:  Please see elsewhere in the note.     ASSESSMENT AND PLAN:  CHB:  He has no further symptoms related to this. He's had appropriate pacemaker follow-up and is scheduled to see Dr. Johney Frame.  CAD:  He has no active symptoms. I will consider routine stress testing in the future.  HTN: His blood pressure is well controlled. He will continue the meds as listed    DYSLIPIDEMIA:  He is now on Lipitor. If he has not had a lipid profile at the next visit I will order one.   DM:  He reports his blood sugars well controlled now at home. I will defer management to EDENFIELD, [place] Miners, PA-C  Current medicines are reviewed at length with the patient today.  The patient does not have concerns regarding medicines.  The following changes have been made:  no change  Labs/ tests ordered today include: None    Disposition:   FU with me in November.    Signed, Rollene Rotunda, MD  03/10/2015 12:08 PM    [facility]

## 2015-03-10 NOTE — Patient Instructions (Signed)
Your physician recommends that you continue on your current medications as directed. Please refer to the Current Medication list given to you today.  Follow up in 6 months with Dr. Hochrein in [place].  You will receive a letter in the mail 2 months before you are due.  Please call us when you receive this letter to schedule your follow up appointment.  Thank you for choosing [facility]!!      

## 2015-03-25 DIAGNOSIS — M86471 Chronic osteomyelitis with draining sinus, right ankle and foot: Secondary | ICD-10-CM | POA: Diagnosis not present

## 2015-03-25 DIAGNOSIS — L97511 Non-pressure chronic ulcer of other part of right foot limited to breakdown of skin: Secondary | ICD-10-CM | POA: Diagnosis not present

## 2015-03-25 DIAGNOSIS — E11610 Type 2 diabetes mellitus with diabetic neuropathic arthropathy: Secondary | ICD-10-CM | POA: Diagnosis not present

## 2015-03-25 DIAGNOSIS — E1151 Type 2 diabetes mellitus with diabetic peripheral angiopathy without gangrene: Secondary | ICD-10-CM | POA: Diagnosis present

## 2015-03-25 DIAGNOSIS — Z794 Long term (current) use of insulin: Secondary | ICD-10-CM | POA: Diagnosis not present

## 2015-03-25 DIAGNOSIS — B9689 Other specified bacterial agents as the cause of diseases classified elsewhere: Secondary | ICD-10-CM | POA: Diagnosis not present

## 2015-04-22 ENCOUNTER — Encounter (HOSPITAL_BASED_OUTPATIENT_CLINIC_OR_DEPARTMENT_OTHER): Payer: Medicare Other | Attending: Internal Medicine

## 2015-04-22 DIAGNOSIS — M86671 Other chronic osteomyelitis, right ankle and foot: Secondary | ICD-10-CM | POA: Insufficient documentation

## 2015-04-22 DIAGNOSIS — E11621 Type 2 diabetes mellitus with foot ulcer: Secondary | ICD-10-CM | POA: Insufficient documentation

## 2015-04-22 DIAGNOSIS — E11610 Type 2 diabetes mellitus with diabetic neuropathic arthropathy: Secondary | ICD-10-CM | POA: Insufficient documentation

## 2015-04-22 DIAGNOSIS — R609 Edema, unspecified: Secondary | ICD-10-CM | POA: Insufficient documentation

## 2015-04-22 DIAGNOSIS — L97511 Non-pressure chronic ulcer of other part of right foot limited to breakdown of skin: Secondary | ICD-10-CM | POA: Insufficient documentation

## 2015-04-22 DIAGNOSIS — E1151 Type 2 diabetes mellitus with diabetic peripheral angiopathy without gangrene: Secondary | ICD-10-CM | POA: Insufficient documentation

## 2015-04-29 DIAGNOSIS — M86671 Other chronic osteomyelitis, right ankle and foot: Secondary | ICD-10-CM | POA: Diagnosis not present

## 2015-04-29 DIAGNOSIS — R609 Edema, unspecified: Secondary | ICD-10-CM | POA: Diagnosis not present

## 2015-04-29 DIAGNOSIS — L97511 Non-pressure chronic ulcer of other part of right foot limited to breakdown of skin: Secondary | ICD-10-CM | POA: Diagnosis not present

## 2015-04-29 DIAGNOSIS — E1151 Type 2 diabetes mellitus with diabetic peripheral angiopathy without gangrene: Secondary | ICD-10-CM | POA: Diagnosis not present

## 2015-04-29 DIAGNOSIS — E11610 Type 2 diabetes mellitus with diabetic neuropathic arthropathy: Secondary | ICD-10-CM | POA: Diagnosis not present

## 2015-04-29 DIAGNOSIS — E11621 Type 2 diabetes mellitus with foot ulcer: Secondary | ICD-10-CM | POA: Diagnosis not present

## 2015-04-30 ENCOUNTER — Encounter: Payer: Self-pay | Admitting: Internal Medicine

## 2015-04-30 ENCOUNTER — Ambulatory Visit (INDEPENDENT_AMBULATORY_CARE_PROVIDER_SITE_OTHER): Payer: Medicare Other | Admitting: Internal Medicine

## 2015-04-30 VITALS — BP 122/80 | HR 51 | Ht 67.0 in | Wt 211.0 lb

## 2015-04-30 DIAGNOSIS — I10 Essential (primary) hypertension: Secondary | ICD-10-CM | POA: Diagnosis not present

## 2015-04-30 DIAGNOSIS — I441 Atrioventricular block, second degree: Secondary | ICD-10-CM

## 2015-04-30 DIAGNOSIS — I2510 Atherosclerotic heart disease of native coronary artery without angina pectoris: Secondary | ICD-10-CM

## 2015-04-30 DIAGNOSIS — Z9861 Coronary angioplasty status: Secondary | ICD-10-CM | POA: Diagnosis not present

## 2015-04-30 DIAGNOSIS — I442 Atrioventricular block, complete: Secondary | ICD-10-CM | POA: Diagnosis not present

## 2015-04-30 NOTE — Patient Instructions (Signed)
Your physician recommends that you continue on your current medications as directed. Please refer to the Current Medication list given to you today. Carelink device check on 08/02/15. Your physician recommends that you schedule a follow-up appointment in: 1 year with Dr. Rayann Heman. You will receive a reminder letter in the mail in about 10 months reminding you to call and schedule your appointment. If you don't receive this letter, please contact our office.

## 2015-04-30 NOTE — Progress Notes (Signed)
Electrophysiology Office Note   Date:  04/30/2015   ID:  Nathan Miller, Nathan Miller 1951-04-06, MRN [id number]  PCP:  Rose Fillers, PA-C  Cardiologist:  Dr Antoine Poche Primary Electrophysiologist: Hillis Range, MD    Chief Complaint  Patient presents with  . heart block     History of Present Illness: Nathan Miller is a 64 y.o. male who presents today for electrophysiology evaluation.   Since his recent pacemaker implant he has done well.  His syncope has resolved. Energy is good.  Denies procedure related complications.  Today, he denies symptoms of palpitations, chest pain, shortness of breath, orthopnea, PND, lower extremity edema, claudication,  bleeding, or neurologic sequela. The patient is tolerating medications without difficulties and is otherwise without complaint today.    Past Medical History  Diagnosis Date  . Osteomyelitis   . Diabetes mellitus   . CAD (coronary artery disease)     NSTEMI in 2001. Cardiac cath showed: LAD: 40% proximal, LCX: 70% mid, RCA thrombotic occlusion in mid segment. s/p PCI and 2 overlapped BMSs to RCA.   [name] Hypertension    Past Surgical History  Procedure Laterality Date  . Cardiac catheterization    . Coronary angioplasty    . Temporary pacemaker insertion N/A 01/21/2015    Procedure: TEMPORARY PACEMAKER INSERTION;  Surgeon: Marykay Lex, MD;  Location: [facility] CATH LAB;  Service: Cardiovascular;  Laterality: N/A;  . Permanent pacemaker insertion N/A 01/22/2015    MDT Advisa pacemaker implanted for complete heart block by Dr Johney Frame     Current Outpatient Prescriptions  Medication Sig Dispense Refill  . aspirin EC 81 MG tablet Take 1 tablet (81 mg total) by mouth daily. 30 tablet 0  . atorvastatin (LIPITOR) 80 MG tablet Take 1 tablet (80 mg total) by mouth daily at 6 PM. 30 tablet 0  . blood glucose meter kit and supplies Dispense based on patient and insurance preference. Use up to four times daily as directed. (FOR ICD-9 250.00, 250.01).  1 each 0  . carvedilol (COREG) 6.25 MG tablet Take 1 tablet (6.25 mg total) by mouth 2 (two) times daily with a meal. 60 tablet 0  . insulin aspart (NOVOLOG) 100 UNIT/ML FlexPen Inject 3 Units into the skin 3 (three) times daily with meals. 15 mL 0  . insulin glargine (LANTUS) 100 unit/mL SOPN Inject 0.12 mLs (12 Units total) into the skin at bedtime. 15 mL 0  . Insulin Pen Needle 31G X 5 MM MISC Use four times daily with insulin injections 200 each 0  . lisinopril (PRINIVIL,ZESTRIL) 10 MG tablet Take 1 tablet (10 mg total) by mouth daily. 30 tablet 0  . metFORMIN (GLUCOPHAGE) 500 MG tablet Take 1 tablet (500 mg total) by mouth 2 (two) times daily with a meal. 60 tablet 0  . nitroGLYCERIN (NITROSTAT) 0.4 MG SL tablet Place 1 tablet (0.4 mg total) under the tongue every 5 (five) minutes as needed for chest pain. 20 tablet 0   No current facility-administered medications for this visit.    Allergies:   Penicillins   Social History:  The patient  reports that he has quit smoking. His smoking use included Cigarettes. He has a 12 pack-year smoking history. He has quit using smokeless tobacco. He reports that he drinks about 25.2 oz of alcohol per week. He reports that he does not use illicit drugs.   Family History:  The patient's family history includes COPD in his father; Heart disease in his father and  paternal grandfather.    ROS:  Please see the history of present illness.   All other systems are reviewed and negative.    PHYSICAL EXAM: VS:  BP 122/80 mmHg  Pulse 51  Ht 5\' 7"  (1.702 m)  Wt 95.709 kg (211 lb)  BMI 33.04 kg/m2  SpO2 98% , BMI Body mass index is 33.04 kg/(m^2). GEN: overweight, in no acute distress HEENT: normal Neck: no JVD, carotid bruits, or masses Cardiac: RRR; no murmurs, rubs, or gallops,no edema  Respiratory:  clear to auscultation bilaterally, normal work of breathing GI: soft, nontender, nondistended, + BS MS: no deformity or atrophy Skin: warm and dry,  device pocket is well healed Neuro:  Strength and sensation are intact Psych: euthymic mood, full affect  Device interrogation is reviewed today in detail.  See PaceArt for details.   Recent Labs: 01/21/2015: Magnesium 2.4; TSH 2.244 01/22/2015: ALT 45 01/24/2015: BUN 9; Creatinine, Ser 0.80; Hemoglobin 15.8; Platelets 195; Potassium 3.6; Sodium 133*    Lipid Panel  No results found for: CHOL, TRIG, HDL, CHOLHDL, VLDL, LDLCALC, LDLDIRECT   Wt Readings from Last 3 Encounters:  04/30/15 95.709 kg (211 lb)  03/10/15 97.977 kg (216 lb)  01/25/15 103.783 kg (228 lb 12.8 oz)     ASSESSMENT AND PLAN:  1.  Complete heart block Device dependant today At time of implant, only a ventricular lead could be placed due to venous anatomy issues. He has done great with VVI pacing.  We could consider trying to go back for an atrial lead in the future, however I think that we should avoid this if at all possible  2. CAD No ischemic symptoms  3. htn Stable No change required today   Follow-up: carelink Return to see me in 1 year  Current medicines are reviewed at length with the patient today.   The patient does not have concerns regarding his medicines.  The following changes were made today:  none  Signed, Hillis Range, MD  04/30/2015 12:37 PM     [facility] [address] [place] [address] [phone] (office) [phone] (fax)

## 2015-05-04 LAB — CUP PACEART INCLINIC DEVICE CHECK
Battery Remaining Longevity: 123 mo
Battery Voltage: 3.03 V
Brady Statistic RV Percent Paced: 74.51 %
Date Time Interrogation Session: 20160701161614
Lead Channel Impedance Value: 589 Ohm
Lead Channel Impedance Value: 703 Ohm
Lead Channel Pacing Threshold Amplitude: 0.75 V
Lead Channel Pacing Threshold Pulse Width: 0.4 ms
Lead Channel Setting Pacing Amplitude: 2.5 V
Lead Channel Setting Pacing Pulse Width: 0.4 ms
Lead Channel Setting Sensing Sensitivity: 0.9 mV
Zone Setting Detection Interval: 360 ms

## 2015-05-27 ENCOUNTER — Encounter (HOSPITAL_BASED_OUTPATIENT_CLINIC_OR_DEPARTMENT_OTHER): Payer: Medicare Other | Attending: Internal Medicine

## 2015-05-27 DIAGNOSIS — G9009 Other idiopathic peripheral autonomic neuropathy: Secondary | ICD-10-CM | POA: Insufficient documentation

## 2015-05-27 DIAGNOSIS — L97512 Non-pressure chronic ulcer of other part of right foot with fat layer exposed: Secondary | ICD-10-CM | POA: Diagnosis present

## 2015-05-27 DIAGNOSIS — M86471 Chronic osteomyelitis with draining sinus, right ankle and foot: Secondary | ICD-10-CM | POA: Diagnosis not present

## 2015-06-13 ENCOUNTER — Encounter (HOSPITAL_COMMUNITY): Payer: Self-pay | Admitting: *Deleted

## 2015-06-13 ENCOUNTER — Emergency Department (HOSPITAL_COMMUNITY): Payer: Medicare Other

## 2015-06-13 ENCOUNTER — Inpatient Hospital Stay (HOSPITAL_COMMUNITY)
Admission: EM | Admit: 2015-06-13 | Discharge: 2015-06-18 | DRG: 872 | Disposition: A | Discharge status: Home / Self Care | Payer: Medicare Other | Attending: Internal Medicine | Admitting: Internal Medicine

## 2015-06-13 DIAGNOSIS — I509 Heart failure, unspecified: Secondary | ICD-10-CM | POA: Diagnosis present

## 2015-06-13 DIAGNOSIS — E876 Hypokalemia: Secondary | ICD-10-CM | POA: Diagnosis present

## 2015-06-13 DIAGNOSIS — I442 Atrioventricular block, complete: Secondary | ICD-10-CM | POA: Diagnosis not present

## 2015-06-13 DIAGNOSIS — K5720 Diverticulitis of large intestine with perforation and abscess without bleeding: Secondary | ICD-10-CM | POA: Diagnosis present

## 2015-06-13 DIAGNOSIS — E1165 Type 2 diabetes mellitus with hyperglycemia: Secondary | ICD-10-CM | POA: Diagnosis present

## 2015-06-13 DIAGNOSIS — S91301A Unspecified open wound, right foot, initial encounter: Secondary | ICD-10-CM | POA: Diagnosis present

## 2015-06-13 DIAGNOSIS — A419 Sepsis, unspecified organism: Secondary | ICD-10-CM | POA: Diagnosis present

## 2015-06-13 DIAGNOSIS — M86671 Other chronic osteomyelitis, right ankle and foot: Secondary | ICD-10-CM | POA: Diagnosis present

## 2015-06-13 DIAGNOSIS — Z7982 Long term (current) use of aspirin: Secondary | ICD-10-CM

## 2015-06-13 DIAGNOSIS — I10 Essential (primary) hypertension: Secondary | ICD-10-CM | POA: Diagnosis present

## 2015-06-13 DIAGNOSIS — Z794 Long term (current) use of insulin: Secondary | ICD-10-CM

## 2015-06-13 DIAGNOSIS — K5780 Diverticulitis of intestine, part unspecified, with perforation and abscess without bleeding: Secondary | ICD-10-CM

## 2015-06-13 DIAGNOSIS — I252 Old myocardial infarction: Secondary | ICD-10-CM

## 2015-06-13 DIAGNOSIS — K429 Umbilical hernia without obstruction or gangrene: Secondary | ICD-10-CM | POA: Diagnosis present

## 2015-06-13 DIAGNOSIS — Z87891 Personal history of nicotine dependence: Secondary | ICD-10-CM | POA: Diagnosis not present

## 2015-06-13 DIAGNOSIS — R12 Heartburn: Secondary | ICD-10-CM | POA: Diagnosis present

## 2015-06-13 DIAGNOSIS — I2510 Atherosclerotic heart disease of native coronary artery without angina pectoris: Secondary | ICD-10-CM | POA: Diagnosis present

## 2015-06-13 DIAGNOSIS — K5792 Diverticulitis of intestine, part unspecified, without perforation or abscess without bleeding: Secondary | ICD-10-CM | POA: Diagnosis present

## 2015-06-13 DIAGNOSIS — Z955 Presence of coronary angioplasty implant and graft: Secondary | ICD-10-CM | POA: Diagnosis not present

## 2015-06-13 DIAGNOSIS — K5700 Diverticulitis of small intestine with perforation and abscess without bleeding: Secondary | ICD-10-CM

## 2015-06-13 DIAGNOSIS — R001 Bradycardia, unspecified: Secondary | ICD-10-CM | POA: Diagnosis present

## 2015-06-13 DIAGNOSIS — Z950 Presence of cardiac pacemaker: Secondary | ICD-10-CM

## 2015-06-13 DIAGNOSIS — IMO0002 Reserved for concepts with insufficient information to code with codable children: Secondary | ICD-10-CM | POA: Diagnosis present

## 2015-06-13 DIAGNOSIS — Z880 Allergy status to penicillin: Secondary | ICD-10-CM

## 2015-06-13 DIAGNOSIS — IMO0001 Reserved for inherently not codable concepts without codable children: Secondary | ICD-10-CM | POA: Insufficient documentation

## 2015-06-13 DIAGNOSIS — Z9861 Coronary angioplasty status: Secondary | ICD-10-CM

## 2015-06-13 HISTORY — DX: Heart failure, unspecified: I50.9

## 2015-06-13 LAB — I-STAT CG4 LACTIC ACID, ED
Lactic Acid, Venous: 1.65 mmol/L (ref 0.5–2.0)
Lactic Acid, Venous: 2.48 mmol/L (ref 0.5–2.0)

## 2015-06-13 LAB — COMPREHENSIVE METABOLIC PANEL
ALT: 13 U/L — ABNORMAL LOW (ref 17–63)
AST: 18 U/L (ref 15–41)
Albumin: 3.4 g/dL — ABNORMAL LOW (ref 3.5–5.0)
Alkaline Phosphatase: 96 U/L (ref 38–126)
Anion gap: 10 (ref 5–15)
BUN: 13 mg/dL (ref 6–20)
CO2: 29 mmol/L (ref 22–32)
Calcium: 9.1 mg/dL (ref 8.9–10.3)
Chloride: 92 mmol/L — ABNORMAL LOW (ref 101–111)
Creatinine, Ser: 0.92 mg/dL (ref 0.61–1.24)
GFR calc Af Amer: >60 mL/min (ref >60)
GFR calc non Af Amer: >60 mL/min (ref >60)
Glucose, Bld: 134 mg/dL — ABNORMAL HIGH (ref 65–99)
Potassium: 3.7 mmol/L (ref 3.5–5.1)
Sodium: 131 mmol/L — ABNORMAL LOW (ref 135–145)
Total Bilirubin: 3 mg/dL — ABNORMAL HIGH (ref 0.3–1.2)
Total Protein: 7.3 g/dL (ref 6.5–8.1)

## 2015-06-13 LAB — URINE MICROSCOPIC-ADD ON

## 2015-06-13 LAB — URINALYSIS, ROUTINE W REFLEX MICROSCOPIC
Glucose, UA: NEGATIVE mg/dL
Hgb urine dipstick: NEGATIVE
Ketones, ur: 15 mg/dL — AB
Nitrite: POSITIVE — AB
Protein, ur: 30 mg/dL — AB
Specific Gravity, Urine: 1.027 (ref 1.005–1.030)
Urobilinogen, UA: 1 mg/dL (ref 0.0–1.0)
pH: 5 (ref 5.0–8.0)

## 2015-06-13 LAB — CBC
HCT: 42.8 % (ref 39.0–52.0)
Hemoglobin: 14.5 g/dL (ref 13.0–17.0)
MCH: 28.9 pg (ref 26.0–34.0)
MCHC: 33.9 g/dL (ref 30.0–36.0)
MCV: 85.3 fL (ref 78.0–100.0)
Platelets: 249 10*3/uL (ref 150–400)
RBC: 5.02 MIL/uL (ref 4.22–5.81)
RDW: 14.5 % (ref 11.5–15.5)
WBC: 15 10*3/uL — ABNORMAL HIGH (ref 4.0–10.5)

## 2015-06-13 LAB — LIPASE, BLOOD: Lipase: 13 U/L — ABNORMAL LOW (ref 22–51)

## 2015-06-13 MED ORDER — CIPROFLOXACIN IN D5W 400 MG/200ML IV SOLN
400.0000 mg | Freq: Two times a day (BID) | INTRAVENOUS | Status: DC
  Filled 2015-06-13: qty 200

## 2015-06-13 MED ORDER — METRONIDAZOLE IN NACL 5-0.79 MG/ML-% IV SOLN
500.0000 mg | Freq: Once | INTRAVENOUS | Status: AC
  Administered 2015-06-13: 500 mg via INTRAVENOUS
  Filled 2015-06-13: qty 100

## 2015-06-13 MED ORDER — METRONIDAZOLE IN NACL 5-0.79 MG/ML-% IV SOLN
500.0000 mg | Freq: Three times a day (TID) | INTRAVENOUS | Status: DC
  Administered 2015-06-14 – 2015-06-17 (×10): 500 mg via INTRAVENOUS
  Filled 2015-06-13 (×14): qty 100

## 2015-06-13 MED ORDER — ACETAMINOPHEN 325 MG PO TABS
650.0000 mg | ORAL_TABLET | Freq: Once | ORAL | Status: AC | PRN
Start: 2015-06-13 — End: 2015-06-13
  Administered 2015-06-13: 650 mg via ORAL

## 2015-06-13 MED ORDER — SODIUM CHLORIDE 0.9 % IV BOLUS (SEPSIS)
1000.0000 mL | Freq: Once | INTRAVENOUS | Status: AC
  Administered 2015-06-13: 1000 mL via INTRAVENOUS

## 2015-06-13 MED ORDER — IOHEXOL 300 MG/ML  SOLN
25.0000 mL | Freq: Once | INTRAMUSCULAR | Status: AC | PRN
  Administered 2015-06-13: 25 mL via ORAL

## 2015-06-13 MED ORDER — METRONIDAZOLE IN NACL 5-0.79 MG/ML-% IV SOLN
500.0000 mg | Freq: Three times a day (TID) | INTRAVENOUS | Status: DC
  Filled 2015-06-13: qty 100

## 2015-06-13 MED ORDER — CIPROFLOXACIN IN D5W 400 MG/200ML IV SOLN
400.0000 mg | Freq: Once | INTRAVENOUS | Status: AC
  Administered 2015-06-13: 400 mg via INTRAVENOUS
  Filled 2015-06-13: qty 200

## 2015-06-13 MED ORDER — PANTOPRAZOLE SODIUM 40 MG IV SOLR
40.0000 mg | Freq: Two times a day (BID) | INTRAVENOUS | Status: DC
  Administered 2015-06-14 – 2015-06-16 (×7): 40 mg via INTRAVENOUS
  Filled 2015-06-13 (×14): qty 40

## 2015-06-13 MED ORDER — ACETAMINOPHEN 325 MG PO TABS
325.0000 mg | ORAL_TABLET | Freq: Once | ORAL | Status: DC
  Filled 2015-06-13: qty 1

## 2015-06-13 MED ORDER — SODIUM CHLORIDE 0.9 % IV BOLUS (SEPSIS)
1000.0000 mL | INTRAVENOUS | Status: AC
  Administered 2015-06-14 (×2): 1000 mL via INTRAVENOUS

## 2015-06-13 MED ORDER — IOHEXOL 300 MG/ML  SOLN
100.0000 mL | Freq: Once | INTRAMUSCULAR | Status: AC | PRN
  Administered 2015-06-13: 100 mL via INTRAVENOUS

## 2015-06-13 MED ORDER — CIPROFLOXACIN IN D5W 400 MG/200ML IV SOLN
400.0000 mg | Freq: Two times a day (BID) | INTRAVENOUS | Status: DC
  Administered 2015-06-14 – 2015-06-16 (×6): 400 mg via INTRAVENOUS
  Filled 2015-06-13 (×8): qty 200

## 2015-06-13 MED ORDER — ACETAMINOPHEN 325 MG PO TABS
ORAL_TABLET | ORAL | Status: AC
  Filled 2015-06-13: qty 2

## 2015-06-13 MED ORDER — SODIUM CHLORIDE 0.9 % IV BOLUS (SEPSIS)
500.0000 mL | INTRAVENOUS | Status: AC
  Administered 2015-06-14: 500 mL via INTRAVENOUS

## 2015-06-13 MED ORDER — FAMOTIDINE IN NACL 20-0.9 MG/50ML-% IV SOLN
20.0000 mg | Freq: Once | INTRAVENOUS | Status: AC
  Administered 2015-06-13: 20 mg via INTRAVENOUS
  Filled 2015-06-13: qty 50

## 2015-06-13 MED ORDER — ACETAMINOPHEN 325 MG PO TABS
650.0000 mg | ORAL_TABLET | Freq: Once | ORAL | Status: AC | PRN
  Administered 2015-06-13: 650 mg via ORAL

## 2015-06-13 MED ORDER — INSULIN ASPART 100 UNIT/ML ~~LOC~~ SOLN: 0.0000 [IU] | Freq: Three times a day (TID) | SUBCUTANEOUS | Status: DC

## 2015-06-13 NOTE — ED Notes (Signed)
Pt reports generalized abd pain that started yesterday evening. Pt has fever at triage, denies bodyaches or headache. Denies n/v/d but does reports some urinary symptoms this am.

## 2015-06-13 NOTE — ED Provider Notes (Signed)
CSN: [id number]     Arrival date & time 06/13/15  1542 History   First MD Initiated Contact with Patient 06/13/15 1851     Chief Complaint  Patient presents with  . Abdominal Pain  . Fever    HPI  Mr. Emmick is a 64 year old male with PMHx of DM, CAD, pacemaker presenting with one day history of abdominal pain and fevers. Pt reports severe lower abdominal pain starting last night along with fevers, chills, sweating and muscle aches. Took pepto bismol with minimal relief. Abdominal pain was still present upon waking this morning though less severe. Denies nausea and diarrhea. Had one episode of vomiting on arrival to the ED. Described vomit as yellow bile. Also describes decreased volume of urine since abdominal pain started. He denies having to strain to urinate and he feels like he empties his bladder fully. Denies headache, chest pain, shortness of breath and dysuria.  Past Medical History  Diagnosis Date  . Osteomyelitis   . Diabetes mellitus   . CAD (coronary artery disease)     NSTEMI in 2001. Cardiac cath showed: LAD: 40% proximal, LCX: 70% mid, RCA thrombotic occlusion in mid segment. s/p PCI and 2 overlapped BMSs to RCA.   [name] Hypertension   . CHF (congestive heart failure)    Past Surgical History  Procedure Laterality Date  . Cardiac catheterization    . Coronary angioplasty    . Temporary pacemaker insertion N/A 01/21/2015    Procedure: TEMPORARY PACEMAKER INSERTION;  Surgeon: Leonie Man, MD;  Location: [facility] CATH LAB;  Service: Cardiovascular;  Laterality: N/A;  . 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 Heart disease Father   . COPD Father   . Heart disease Paternal Grandfather    Social History  Substance Use Topics  . Smoking status: Former Smoker -- 0.30 packs/day for 40 years    Types: Cigarettes  . Smokeless tobacco: Former Systems developer  .  Alcohol Use: 25.2 oz/week    42 Cans of beer per week     Comment: beer    Review of Systems  Constitutional: Positive for fever, chills and diaphoresis.  Respiratory: Negative for shortness of breath.   Cardiovascular: Negative for chest pain and palpitations.  Gastrointestinal: Positive for vomiting and abdominal pain. Negative for nausea and diarrhea.  Genitourinary: Positive for decreased urine volume. Negative for dysuria, hematuria and difficulty urinating.  Musculoskeletal: Positive for myalgias. Negative for neck pain.  Neurological: Negative for dizziness, light-headedness and headaches.      Allergies  Penicillins  Home Medications   Prior to Admission medications   Medication Sig Start Date End Date Taking? Authorizing Provider  aspirin EC 81 MG tablet Take 1 tablet (81 mg total) by mouth daily. 01/25/15  Yes Janece Canterbury, MD  atorvastatin (LIPITOR) 80 MG tablet Take 1 tablet (80 mg total) by mouth daily at 6 PM. 01/25/15  Yes Janece Canterbury, MD  bismuth subsalicylate (PEPTO BISMOL) 262 MG/15ML suspension Take 30 mLs by mouth every 6 (six) hours as needed.   Yes Historical Provider, MD  carvedilol (COREG) 6.25 MG tablet Take 1 tablet (6.25 mg total) by mouth 2 (two) times daily with a meal. 01/25/15  Yes Janece Canterbury, MD  insulin aspart (NOVOLOG) 100 UNIT/ML FlexPen Inject 3 Units into the skin 3 (three) times daily with meals. 01/25/15  Yes Janece Canterbury,  MD  insulin glargine (LANTUS) 100 unit/mL SOPN Inject 0.12 mLs (12 Units total) into the skin at bedtime. 01/25/15  Yes Janece Canterbury, MD  lisinopril (PRINIVIL,ZESTRIL) 10 MG tablet Take 1 tablet (10 mg total) by mouth daily. 01/25/15  Yes Janece Canterbury, MD  metFORMIN (GLUCOPHAGE) 500 MG tablet Take 1 tablet (500 mg total) by mouth 2 (two) times daily with a meal. 01/25/15  Yes Janece Canterbury, MD  nitroGLYCERIN (NITROSTAT) 0.4 MG SL tablet Place 1 tablet (0.4 mg total) under the tongue every 5 (five) minutes as  needed for chest pain. 01/25/15  Yes Janece Canterbury, MD  blood glucose meter kit and supplies Dispense based on patient and insurance preference. Use up to four times daily as directed. (FOR ICD-9 250.00, 250.01). 01/25/15   Janece Canterbury, MD   BP 118/43 mmHg  Pulse 49  Temp(Src) 100.2 F (37.9 C) (Oral)  Resp 18  SpO2 100% Physical Exam  Constitutional: He is oriented to person, place, and time. He appears well-developed and well-nourished. No distress.  HENT:  Head: Normocephalic and atraumatic.  Cardiovascular: Normal rate, regular rhythm and normal heart sounds.   Pulmonary/Chest: Effort normal and breath sounds normal.  Abdominal: Soft. Bowel sounds are normal. He exhibits no distension. There is tenderness in the right lower quadrant. There is no rigidity, no rebound and no guarding. A hernia (umbilical, easily reducible) is present.  Neurological: He is alert and oriented to person, place, and time.  Skin: Skin is warm and dry.  Psychiatric: He has a normal mood and affect.    ED Course  Procedures (including critical care time) Labs Review Labs Reviewed  LIPASE, BLOOD - Abnormal; Notable for the following:    Lipase 13 (*)    All other components within normal limits  COMPREHENSIVE METABOLIC PANEL - Abnormal; Notable for the following:    Sodium 131 (*)    Chloride 92 (*)    Glucose, Bld 134 (*)    Albumin 3.4 (*)    ALT 13 (*)    Total Bilirubin 3.0 (*)    All other components within normal limits  CBC - Abnormal; Notable for the following:    WBC 15.0 (*)    All other components within normal limits  URINALYSIS, ROUTINE W REFLEX MICROSCOPIC (NOT AT [facility]) - Abnormal; Notable for the following:    Color, Urine ORANGE (*)    APPearance CLOUDY (*)    Bilirubin Urine MODERATE (*)    Ketones, ur 15 (*)    Protein, ur 30 (*)    Nitrite POSITIVE (*)    Leukocytes, UA SMALL (*)    All other components within normal limits  URINE MICROSCOPIC-ADD ON - Abnormal; Notable  for the following:    Bacteria, UA MANY (*)    Casts HYALINE CASTS (*)    All other components within normal limits  I-STAT CG4 LACTIC ACID, ED - Abnormal; Notable for the following:    Lactic Acid, Venous 2.48 (*)    All other components within normal limits  CULTURE, BLOOD (ROUTINE X 2)  CULTURE, BLOOD (ROUTINE X 2)  URINE CULTURE  COMPREHENSIVE METABOLIC PANEL  LACTIC ACID, PLASMA  LACTIC ACID, PLASMA  PROCALCITONIN  PROTIME-INR  APTT  I-STAT CG4 LACTIC ACID, ED    Imaging Review Ct Abdomen Pelvis W Contrast  06/13/2015   CLINICAL DATA:  Acute onset of generalized abdominal pain and vomiting. Fever. Initial encounter.  EXAM: CT ABDOMEN AND PELVIS WITH CONTRAST  TECHNIQUE: Multidetector CT imaging of  the abdomen and pelvis was performed using the standard protocol following bolus administration of intravenous contrast.  CONTRAST:  190m OMNIPAQUE IOHEXOL 300 MG/ML  SOLN  COMPARISON:  None.  FINDINGS: The visualized lung bases are clear. Scattered coronary artery calcifications are seen. A pacemaker lead is partially imaged.  The liver and spleen are unremarkable in appearance. The gallbladder is within normal limits. The pancreas and adrenal glands are unremarkable.  Nonspecific perinephric stranding is noted bilaterally. Right renal cysts measure up to 2.4 cm in size. There is no evidence of hydronephrosis. No renal or ureteral stones are seen.  No free fluid is identified. The small bowel is unremarkable in appearance. The stomach is within normal limits. No acute vascular abnormalities are seen. Scattered calcification is noted along the distal abdominal aorta and its branches.  A moderate umbilical hernia is seen, containing only fat.  There appears to be a localized perforation at the right mid sigmoid colon, with scattered soft tissue air tracking into the surrounding fat, and extensive surrounding soft tissue inflammation and trace fluid. A small amount of peripherally enhancing fluid  is noted, measuring up to 1.5 cm, without a definite drainable abscess. Diffuse soft tissue edema extends superiorly along the mesentery, and into the pelvis, with inflammation tracking about the decompressed bladder. Soft tissue inflammation extends about adjacent small bowel loops.  This reflects acute diverticulitis at the mid sigmoid colon, with associated colonic wall thickening. Underlying diverticulosis involves the distal descending and sigmoid colon.  The appendix is normal in caliber, partially obscured by surrounding soft tissue inflammation.  The bladder is decompressed and not well assessed. The prostate is mildly enlarged, measuring 4.9 cm in transverse dimension. No inguinal lymphadenopathy is seen.  No acute osseous abnormalities are identified.  IMPRESSION: 1. Localized perforation at the right mid sigmoid colon, with scattered soft tissue air tracking into the surrounding fat, and extensive surrounding soft tissue inflammation and trace fluid. Small peripherally enhancing fluid collection measuring 1.5 cm noted, without a definite drainable abscess. Diffuse soft tissue edema extends superiorly along the mesentery, and into the pelvis, with inflammation tracking about the decompressed bladder. Soft tissue inflammation tracks about adjacent small bowel loops. This reflects acute diverticulitis at the mid sigmoid colon, with associated colonic wall thickening. 2. Underlying diverticulosis involves the distal descending and sigmoid colon. 3. Mildly enlarged prostate. 4. Right renal cyst noted. 5. Scattered coronary artery calcifications noted. 6. Moderate umbilical hernia, containing only fat. 7. Scattered calcification along the distal abdominal aorta and its branches. Critical Value/emergent results were called by telephone at the time of interpretation on 06/13/2015 at 9:49 pm to LQuincy CarnesPA, who verbally acknowledged these results.   Electronically Signed   By: JGarald BaldingM.D.   On:  06/13/2015 21:57   I, SDanae ChenBarrett, personally reviewed and evaluated these images and lab results as part of my medical decision-making.   EKG Interpretation None      8:15 - Reexamined pt. Abdominal exam unchanged. Pt still declining pain medication 9:45 - Reexamined pt. Abdominal exam unchanged. Pt still declining pain medication. Reporting heartburn and requesting pepcid.  MDM   Final diagnoses:  Diverticulitis of intestine with perforation without bleeding    1. Diverticulitis with perforation - WBC 15, lactic acid 2.48 - Abd pelvic CT showing diverticulitis with perforation - Started on cipro and flagyl - Pt complaining of heartburn, given pepcid - Tylenol given for fever control - NPO - Consult to surgery, discussed pt with Dr. TGrandville Silos does  not recommend surgery at this time and should be admitted to medicine for monitoring - Consult to internal medicine for admission, accepted for step down bed    Verner Kopischke, PA-C 06/13/15 2346  Tanna Furry, MD 06/14/15 0009

## 2015-06-13 NOTE — Consult Note (Signed)
Reason for Consult: Sigmoid diverticulitis Referring Physician: Niel Hummer  Nathan Miller is an 64 y.o. male.  HPI: [name] developed acute onset of left lower quadrant abdominal pain yesterday afternoon. The pain persisted and he came to the emergency department for further evaluation today. He had one episode of vomiting in the waiting room but otherwise has not felt nauseated. He notes no change in his bowel habits. He has never had a colonoscopy as he has always refused in the past according to his wife. He was worked up in the emergency department and found to have sigmoid diverticulitis with localized small perforation. I was asked to see him in consultation regarding this. He has multiple medical problems including diabetes, coronary artery disease, recent pacemaker, and osteomyelitis of his right foot.  Past Medical History  Diagnosis Date  . Osteomyelitis   . Diabetes mellitus   . CAD (coronary artery disease)     NSTEMI in 2001. Cardiac cath showed: LAD: 40% proximal, LCX: 70% mid, RCA thrombotic occlusion in mid segment. s/p PCI and 2 overlapped BMSs to RCA.   [name] Hypertension   . CHF (congestive heart failure)     Past Surgical History  Procedure Laterality Date  . Cardiac catheterization    . Coronary angioplasty    . Temporary pacemaker insertion N/A 01/21/2015    Procedure: TEMPORARY PACEMAKER INSERTION;  Surgeon: Leonie Man, MD;  Location: [facility] CATH LAB;  Service: Cardiovascular;  Laterality: N/A;  . Permanent pacemaker insertion N/A 01/22/2015    MDT Advisa pacemaker implanted for complete heart block by Dr Rayann Heman    Family History  Problem Relation Age of Onset  . Heart disease Father   . COPD Father   . Heart disease Paternal Grandfather     Social History:  reports that he has quit smoking. His smoking use included Cigarettes. He has a 12 pack-year smoking history. He has quit using smokeless tobacco. He reports that he drinks about 25.2 oz of alcohol per week. He  reports that he does not use illicit drugs.  Allergies:  Allergies  Allergen Reactions  . Penicillins Shortness Of Breath    Medications: Prior to Admission:  (Not in a hospital admission)  Results for orders placed or performed during the hospital encounter of 06/13/15 (from the past 48 hour(s))  Lipase, blood     Status: Abnormal   Collection Time: 06/13/15  4:15 PM  Result Value Ref Range   Lipase 13 (L) 22 - 51 U/L  Comprehensive metabolic panel     Status: Abnormal   Collection Time: 06/13/15  4:15 PM  Result Value Ref Range   Sodium 131 (L) 135 - 145 mmol/L   Potassium 3.7 3.5 - 5.1 mmol/L   Chloride 92 (L) 101 - 111 mmol/L   CO2 29 22 - 32 mmol/L   Glucose, Bld 134 (H) 65 - 99 mg/dL   BUN 13 6 - 20 mg/dL   Creatinine, Ser 0.92 0.61 - 1.24 mg/dL   Calcium 9.1 8.9 - 10.3 mg/dL   Total Protein 7.3 6.5 - 8.1 g/dL   Albumin 3.4 (L) 3.5 - 5.0 g/dL   AST 18 15 - 41 U/L   ALT 13 (L) 17 - 63 U/L   Alkaline Phosphatase 96 38 - 126 U/L   Total Bilirubin 3.0 (H) 0.3 - 1.2 mg/dL   GFR calc non Af Amer >60 >60 mL/min   GFR calc Af Amer >60 >60 mL/min    Comment: (NOTE) The eGFR  has been calculated using the CKD EPI equation. This calculation has not been validated in all clinical situations. eGFR's persistently <60 mL/min signify possible Chronic Kidney Disease.    Anion gap 10 5 - 15  CBC     Status: Abnormal   Collection Time: 06/13/15  4:15 PM  Result Value Ref Range   WBC 15.0 (H) 4.0 - 10.5 K/uL   RBC 5.02 4.22 - 5.81 MIL/uL   Hemoglobin 14.5 13.0 - 17.0 g/dL   HCT 42.8 39.0 - 52.0 %   MCV 85.3 78.0 - 100.0 fL   MCH 28.9 26.0 - 34.0 pg   MCHC 33.9 30.0 - 36.0 g/dL   RDW 14.5 11.5 - 15.5 %   Platelets 249 150 - 400 K/uL  I-Stat CG4 Lactic Acid, ED     Status: None   Collection Time: 06/13/15  4:23 PM  Result Value Ref Range   Lactic Acid, Venous 1.65 0.5 - 2.0 mmol/L  I-Stat CG4 Lactic Acid, ED     Status: Abnormal   Collection Time: 06/13/15  7:55 PM  Result  Value Ref Range   Lactic Acid, Venous 2.48 (HH) 0.5 - 2.0 mmol/L   Comment NOTIFIED PHYSICIAN   Urinalysis, Routine w reflex microscopic (not at [facility])     Status: Abnormal   Collection Time: 06/13/15  9:04 PM  Result Value Ref Range   Color, Urine ORANGE (A) YELLOW    Comment: BIOCHEMICALS MAY BE AFFECTED BY COLOR   APPearance CLOUDY (A) CLEAR   Specific Gravity, Urine 1.027 1.005 - 1.030   pH 5.0 5.0 - 8.0   Glucose, UA NEGATIVE NEGATIVE mg/dL   Hgb urine dipstick NEGATIVE NEGATIVE   Bilirubin Urine MODERATE (A) NEGATIVE   Ketones, ur 15 (A) NEGATIVE mg/dL   Protein, ur 30 (A) NEGATIVE mg/dL   Urobilinogen, UA 1.0 0.0 - 1.0 mg/dL   Nitrite POSITIVE (A) NEGATIVE   Leukocytes, UA SMALL (A) NEGATIVE  Urine microscopic-add on     Status: Abnormal   Collection Time: 06/13/15  9:04 PM  Result Value Ref Range   Squamous Epithelial / LPF RARE RARE   WBC, UA 11-20 <3 WBC/hpf   RBC / HPF 3-6 <3 RBC/hpf   Bacteria, UA MANY (A) RARE   Casts HYALINE CASTS (A) NEGATIVE   Urine-Other MUCOUS PRESENT     Ct Abdomen Pelvis W Contrast  06/13/2015   CLINICAL DATA:  Acute onset of generalized abdominal pain and vomiting. Fever. Initial encounter.  EXAM: CT ABDOMEN AND PELVIS WITH CONTRAST  TECHNIQUE: Multidetector CT imaging of the abdomen and pelvis was performed using the standard protocol following bolus administration of intravenous contrast.  CONTRAST:  166mL OMNIPAQUE IOHEXOL 300 MG/ML  SOLN  COMPARISON:  None.  FINDINGS: The visualized lung bases are clear. Scattered coronary artery calcifications are seen. A pacemaker lead is partially imaged.  The liver and spleen are unremarkable in appearance. The gallbladder is within normal limits. The pancreas and adrenal glands are unremarkable.  Nonspecific perinephric stranding is noted bilaterally. Right renal cysts measure up to 2.4 cm in size. There is no evidence of hydronephrosis. No renal or ureteral stones are seen.  No free fluid is identified.  The small bowel is unremarkable in appearance. The stomach is within normal limits. No acute vascular abnormalities are seen. Scattered calcification is noted along the distal abdominal aorta and its branches.  A moderate umbilical hernia is seen, containing only fat.  There appears to be a localized perforation at the  right mid sigmoid colon, with scattered soft tissue air tracking into the surrounding fat, and extensive surrounding soft tissue inflammation and trace fluid. A small amount of peripherally enhancing fluid is noted, measuring up to 1.5 cm, without a definite drainable abscess. Diffuse soft tissue edema extends superiorly along the mesentery, and into the pelvis, with inflammation tracking about the decompressed bladder. Soft tissue inflammation extends about adjacent small bowel loops.  This reflects acute diverticulitis at the mid sigmoid colon, with associated colonic wall thickening. Underlying diverticulosis involves the distal descending and sigmoid colon.  The appendix is normal in caliber, partially obscured by surrounding soft tissue inflammation.  The bladder is decompressed and not well assessed. The prostate is mildly enlarged, measuring 4.9 cm in transverse dimension. No inguinal lymphadenopathy is seen.  No acute osseous abnormalities are identified.  IMPRESSION: 1. Localized perforation at the right mid sigmoid colon, with scattered soft tissue air tracking into the surrounding fat, and extensive surrounding soft tissue inflammation and trace fluid. Small peripherally enhancing fluid collection measuring 1.5 cm noted, without a definite drainable abscess. Diffuse soft tissue edema extends superiorly along the mesentery, and into the pelvis, with inflammation tracking about the decompressed bladder. Soft tissue inflammation tracks about adjacent small bowel loops. This reflects acute diverticulitis at the mid sigmoid colon, with associated colonic wall thickening. 2. Underlying  diverticulosis involves the distal descending and sigmoid colon. 3. Mildly enlarged prostate. 4. Right renal cyst noted. 5. Scattered coronary artery calcifications noted. 6. Moderate umbilical hernia, containing only fat. 7. Scattered calcification along the distal abdominal aorta and its branches. Critical Value/emergent results were called by telephone at the time of interpretation on 06/13/2015 at 9:49 pm to Quincy Carnes PA, who verbally acknowledged these results.   Electronically Signed   By: Garald Balding M.D.   On: 06/13/2015 21:57    Review of Systems  Constitutional: Positive for fever and chills.  HENT: Negative.   Eyes: Negative.   Respiratory: Negative.   Cardiovascular: Negative for chest pain and palpitations.  Gastrointestinal: Positive for nausea, vomiting and abdominal pain.  Genitourinary: Negative.   Musculoskeletal:       Chronic osteomyelitis of his right foot  Skin: Negative.   Neurological: Negative.   Endo/Heme/Allergies: Negative.   Psychiatric/Behavioral: Negative.    Blood pressure 118/43, pulse 49, temperature 100.2 F (37.9 C), temperature source Oral, resp. rate 18, SpO2 100 %. Physical Exam  Constitutional: He is oriented to person, place, and time. He appears well-developed and well-nourished. No distress.  HENT:  Head: Normocephalic and atraumatic.  Right Ear: External ear normal.  Left Ear: External ear normal.  Mouth/Throat: Oropharynx is clear and moist.  Eyes: EOM are normal. Pupils are equal, round, and reactive to light.  Neck: Neck supple.  Cardiovascular: Normal rate and normal heart sounds.   Respiratory: Effort normal and breath sounds normal. No respiratory distress. He has no wheezes.  GI: Soft. He exhibits no distension. There is tenderness. There is no rebound and no guarding.  Tender left lower quadrant without guarding, large but mostly reducible umbilical hernia  Musculoskeletal:  Chronic wound right foot  Neurological: He is alert  and oriented to person, place, and time. He exhibits normal muscle tone.  Skin: Skin is warm.  Psychiatric: He has a normal mood and affect.    Assessment/Plan: Sigmoid diverticulitis with localized small perforation - agree with medical admission, IV antibiotics, and bowel rest. We'll follow closely for improvement. If he worsens, he may need colectomy with colostomy  and this was discussed with him.  Umbilical hernia - not symptomatic at this time  Lauren Modisette E 06/13/2015, 11:00 PM

## 2015-06-13 NOTE — ED Notes (Signed)
Pt aware that we need a urine sample. Unable to go at this time, and has urinal at bedside.

## 2015-06-13 NOTE — H&P (Signed)
[facility] History and Physical  Nathan Miller XBM:[id number] DOB: 15-Dec-1950 DOA: 06/13/2015  Referring physician: PA, Nathan Miller PCP: Nathan Fillers, PA-C   Chief Complaint: Abdominal pain.   HPI: Nathan Miller is a 64 y.o. male with PMH significant for Diabetes, CAD, S/P PCI, CHF, HTN, complete Heart Block S/P pacemaker 12-2014, chronic osteomyelitis right foot who presents complaining of abdominal pain.   Evaluation in the ED; WBC at 15, lactic acid at 2.4, CT abdomen and pelvis: Localized perforation at the right mid sigmoid colon, with scattered soft tissue air tracking into the surrounding fat, and extensive surrounding soft tissue inflammation and trace fluid. Small peripherally enhancing fluid collection measuring 1.5 cm noted, without a definite drainable abscess.     Review of Systems:  Negative, except as per HPI   Past Medical History  Diagnosis Date  . Osteomyelitis   . Diabetes mellitus   . CAD (coronary artery disease)     NSTEMI in 2001. Cardiac cath showed: LAD: 40% proximal, LCX: 70% mid, RCA thrombotic occlusion in mid segment. s/p PCI and 2 overlapped BMSs to RCA.   [name] Hypertension   . CHF (congestive heart failure)    Past Surgical History  Procedure Laterality Date  . Cardiac catheterization    . Coronary angioplasty    . Temporary pacemaker insertion N/A 01/21/2015    Procedure: TEMPORARY PACEMAKER INSERTION;  Surgeon: Nathan Lex, Miller;  Location: [facility] CATH LAB;  Service: Cardiovascular;  Laterality: N/A;  . Permanent pacemaker insertion N/A 01/22/2015    MDT Advisa pacemaker implanted for complete heart block by Nathan Miller   Social History:  reports that he has quit smoking. His smoking use included Cigarettes. He has a 12 pack-year smoking history. He has quit using smokeless tobacco. He reports that he drinks about 25.2 oz of alcohol per week. He reports that he does not use illicit drugs.  Allergies  Allergen Reactions  . Penicillins Shortness Of  Breath    Family History  Problem Relation Age of Onset  . Heart disease Father   . COPD Father   . Heart disease Paternal Grandfather     Prior to Admission medications   Medication Sig Start Date End Date Taking? Authorizing Provider  aspirin EC 81 MG tablet Take 1 tablet (81 mg total) by mouth daily. 01/25/15  Yes Nathan Nathan Miller  atorvastatin (LIPITOR) 80 MG tablet Take 1 tablet (80 mg total) by mouth daily at 6 PM. 01/25/15  Yes Nathan Nathan Miller  bismuth subsalicylate (PEPTO BISMOL) 262 MG/15ML suspension Take 30 mLs by mouth every 6 (six) hours as needed.   Yes Historical Provider, Miller  carvedilol (COREG) 6.25 MG tablet Take 1 tablet (6.25 mg total) by mouth 2 (two) times daily with a meal. 01/25/15  Yes Nathan Nathan Miller  insulin aspart (NOVOLOG) 100 UNIT/ML FlexPen Inject 3 Units into the skin 3 (three) times daily with meals. 01/25/15  Yes Nathan Nathan Miller  insulin glargine (LANTUS) 100 unit/mL SOPN Inject 0.12 mLs (12 Units total) into the skin at bedtime. 01/25/15  Yes Nathan Nathan Miller  lisinopril (PRINIVIL,ZESTRIL) 10 MG tablet Take 1 tablet (10 mg total) by mouth daily. 01/25/15  Yes Nathan Nathan Miller  metFORMIN (GLUCOPHAGE) 500 MG tablet Take 1 tablet (500 mg total) by mouth 2 (two) times daily with a meal. 01/25/15  Yes Nathan Nathan Miller  nitroGLYCERIN (NITROSTAT) 0.4 MG SL tablet Place 1 tablet (0.4 mg total) under the tongue every 5 (five) minutes as  needed for chest pain. 01/25/15  Yes Nathan Nathan Miller  blood glucose meter kit and supplies Dispense based on patient and insurance preference. Use up to four times daily as directed. (FOR ICD-9 250.00, 250.01). 01/25/15   Nathan Nathan Miller   Physical Exam: Filed Vitals:   06/13/15 2030 06/13/15 2045 06/13/15 2100 06/13/15 2105  BP: 103/42 111/53 118/43 118/43  Pulse: 48 50 50 49  Temp:    100.2 F (37.9 C)  TempSrc:    Oral  Resp:   18 18  SpO2: 92% 92% 95% 100%    Wt Readings from Last 3 Encounters:    04/30/15 95.709 kg (211 lb)  03/10/15 97.977 kg (216 lb)  01/25/15 103.783 kg (228 lb 12.8 oz)    General:  Appears calm and comfortable Eyes: PERRL, normal lids, irises & conjunctiva ENT: grossly normal hearing, lips & tongue Neck: no LAD, masses or thyromegaly Cardiovascular: RRR, no m/r/g. No LE edema. Telemetry: SR, no arrhythmias  Respiratory: CTA bilaterally, no w/r/r. Normal respiratory effort. Abdomen: soft, ntnd Skin: no rash or induration seen on limited exam Musculoskeletal: grossly normal tone BUE/BLE Psychiatric: grossly normal mood and affect, speech fluent and appropriate Neurologic: grossly non-focal.          Labs on Admission:  Basic Metabolic Panel:  Recent Labs Lab 06/13/15 1615  NA 131*  K 3.7  CL 92*  CO2 29  GLUCOSE 134*  BUN 13  CREATININE 0.92  CALCIUM 9.1   Liver Function Tests:  Recent Labs Lab 06/13/15 1615  AST 18  ALT 13*  ALKPHOS 96  BILITOT 3.0*  PROT 7.3  ALBUMIN 3.4*    Recent Labs Lab 06/13/15 1615  LIPASE 13*   No results for input(s): AMMONIA in the last 168 hours. CBC:  Recent Labs Lab 06/13/15 1615  WBC 15.0*  HGB 14.5  HCT 42.8  MCV 85.3  PLT 249   Cardiac Enzymes: No results for input(s): CKTOTAL, CKMB, CKMBINDEX, TROPONINI in the last 168 hours.  BNP (last 3 results) No results for input(s): BNP in the last 8760 hours.  ProBNP (last 3 results) No results for input(s): PROBNP in the last 8760 hours.  CBG: No results for input(s): GLUCAP in the last 168 hours.  Radiological Exams on Admission: Ct Abdomen Pelvis W Contrast  06/13/2015   CLINICAL DATA:  Acute onset of generalized abdominal pain and vomiting. Fever. Initial encounter.  EXAM: CT ABDOMEN AND PELVIS WITH CONTRAST  TECHNIQUE: Multidetector CT imaging of the abdomen and pelvis was performed using the standard protocol following bolus administration of intravenous contrast.  CONTRAST:  OMNIPAQUE IOHEXOL 300 MG/ML  SOLN  COMPARISON:   None.  FINDINGS: The visualized lung bases are clear. Scattered coronary artery calcifications are seen. A pacemaker lead is partially imaged.  The liver and spleen are unremarkable in appearance. The gallbladder is within normal limits. The pancreas and adrenal glands are unremarkable.  Nonspecific perinephric stranding is noted bilaterally. Right renal cysts measure up to 2.4 cm in size. There is no evidence of hydronephrosis. No renal or ureteral stones are seen.  No free fluid is identified. The small bowel is unremarkable in appearance. The stomach is within normal limits. No acute vascular abnormalities are seen. Scattered calcification is noted along the distal abdominal aorta and its branches.  A moderate umbilical hernia is seen, containing only fat.  There appears to be a localized perforation at the right mid sigmoid colon, with scattered soft tissue air tracking into the  surrounding fat, and extensive surrounding soft tissue inflammation and trace fluid. A small amount of peripherally enhancing fluid is noted, measuring up to 1.5 cm, without a definite drainable abscess. Diffuse soft tissue edema extends superiorly along the mesentery, and into the pelvis, with inflammation tracking about the decompressed bladder. Soft tissue inflammation extends about adjacent small bowel loops.  This reflects acute diverticulitis at the mid sigmoid colon, with associated colonic wall thickening. Underlying diverticulosis involves the distal descending and sigmoid colon.  The appendix is normal in caliber, partially obscured by surrounding soft tissue inflammation.  The bladder is decompressed and not well assessed. The prostate is mildly enlarged, measuring 4.9 cm in transverse dimension. No inguinal lymphadenopathy is seen.  No acute osseous abnormalities are identified.  IMPRESSION: 1. Localized perforation at the right mid sigmoid colon, with scattered soft tissue air tracking into the surrounding fat, and extensive  surrounding soft tissue inflammation and trace fluid. Small peripherally enhancing fluid collection measuring 1.5 cm noted, without a definite drainable abscess. Diffuse soft tissue edema extends superiorly along the mesentery, and into the pelvis, with inflammation tracking about the decompressed bladder. Soft tissue inflammation tracks about adjacent small bowel loops. This reflects acute diverticulitis at the mid sigmoid colon, with associated colonic wall thickening. 2. Underlying diverticulosis involves the distal descending and sigmoid colon. 3. Mildly enlarged prostate. 4. Right renal cyst noted. 5. Scattered coronary artery calcifications noted. 6. Moderate umbilical hernia, containing only fat. 7. Scattered calcification along the distal abdominal aorta and its branches. Critical Value/emergent results were called by telephone at the time of interpretation on 06/13/2015 at 9:49 pm to Sharilyn Sites PA, who verbally acknowledged these results.   Electronically Signed   By: Roanna Raider M.D.   On: 06/13/2015 21:57    EKG: Independently reviewed. None available. Will order EKG  Assessment/Plan Active Problems:   CAD S/P percutaneous coronary angioplasty; bms X 2 to RCA 2001   Bradycardia   Complete heart block   Diabetes mellitus type 2, uncontrolled   Diverticulitis of intestine with perforation  1-Acute diverticulitis with perforation.  Surgery consulted.  IV fluids, IV antibiotics, NPO.  Lactic acid elevated, leukocytosis.   2-Sepsis: presents with fevers, leukocytosis. Elevated lactic acid.  IV fluids, IV antibiotics.  Sepsis order set.  Lactic acid.  Step down unit.   3-Diabetes. Hold metformin, lantus. SSI.    4-Bradycardia, Pacemaker post complete heart block.  Hold coreg.  5-CAD; hold statin, while NPO.    Code Status: full code.  DVT Prophylaxis: Lovenox.  Family Communication: care discussed with patient.  Disposition Plan: expect 4 to 5 days inpatient.   Time  spent: 75 minutes.   Hartley Barefoot A [facility] Pager [phone]

## 2015-06-14 DIAGNOSIS — K5780 Diverticulitis of intestine, part unspecified, with perforation and abscess without bleeding: Secondary | ICD-10-CM | POA: Insufficient documentation

## 2015-06-14 DIAGNOSIS — I442 Atrioventricular block, complete: Secondary | ICD-10-CM

## 2015-06-14 DIAGNOSIS — R001 Bradycardia, unspecified: Secondary | ICD-10-CM

## 2015-06-14 DIAGNOSIS — IMO0001 Reserved for inherently not codable concepts without codable children: Secondary | ICD-10-CM | POA: Insufficient documentation

## 2015-06-14 DIAGNOSIS — A419 Sepsis, unspecified organism: Principal | ICD-10-CM

## 2015-06-14 LAB — CBC
HCT: 35.6 % — ABNORMAL LOW (ref 39.0–52.0)
Hemoglobin: 11.7 g/dL — ABNORMAL LOW (ref 13.0–17.0)
MCH: 28.6 pg (ref 26.0–34.0)
MCHC: 32.9 g/dL (ref 30.0–36.0)
MCV: 87 fL (ref 78.0–100.0)
Platelets: 182 10*3/uL (ref 150–400)
RBC: 4.09 MIL/uL — ABNORMAL LOW (ref 4.22–5.81)
RDW: 14.6 % (ref 11.5–15.5)
WBC: 19.7 10*3/uL — ABNORMAL HIGH (ref 4.0–10.5)

## 2015-06-14 LAB — COMPREHENSIVE METABOLIC PANEL
ALT: 11 U/L — ABNORMAL LOW (ref 17–63)
AST: 16 U/L (ref 15–41)
Albumin: 3 g/dL — ABNORMAL LOW (ref 3.5–5.0)
Alkaline Phosphatase: 75 U/L (ref 38–126)
Anion gap: 12 (ref 5–15)
BUN: 17 mg/dL (ref 6–20)
CO2: 24 mmol/L (ref 22–32)
Calcium: 8.4 mg/dL — ABNORMAL LOW (ref 8.9–10.3)
Chloride: 94 mmol/L — ABNORMAL LOW (ref 101–111)
Creatinine, Ser: 1.04 mg/dL (ref 0.61–1.24)
GFR calc Af Amer: >60 mL/min (ref >60)
GFR calc non Af Amer: >60 mL/min (ref >60)
Glucose, Bld: 117 mg/dL — ABNORMAL HIGH (ref 65–99)
Potassium: 3.1 mmol/L — ABNORMAL LOW (ref 3.5–5.1)
Sodium: 130 mmol/L — ABNORMAL LOW (ref 135–145)
Total Bilirubin: 3.2 mg/dL — ABNORMAL HIGH (ref 0.3–1.2)
Total Protein: 6.6 g/dL (ref 6.5–8.1)

## 2015-06-14 LAB — GLUCOSE, CAPILLARY
Glucose-Capillary: 95 mg/dL (ref 65–99)
Glucose-Capillary: 84 mg/dL (ref 65–99)
Glucose-Capillary: 96 mg/dL (ref 65–99)
Glucose-Capillary: 105 mg/dL — ABNORMAL HIGH (ref 65–99)

## 2015-06-14 LAB — BASIC METABOLIC PANEL
Anion gap: 8 (ref 5–15)
BUN: 17 mg/dL (ref 6–20)
CO2: 25 mmol/L (ref 22–32)
Calcium: 7.9 mg/dL — ABNORMAL LOW (ref 8.9–10.3)
Chloride: 100 mmol/L — ABNORMAL LOW (ref 101–111)
Creatinine, Ser: 1.04 mg/dL (ref 0.61–1.24)
GFR calc Af Amer: >60 mL/min (ref >60)
GFR calc non Af Amer: >60 mL/min (ref >60)
Glucose, Bld: 101 mg/dL — ABNORMAL HIGH (ref 65–99)
Potassium: 3.3 mmol/L — ABNORMAL LOW (ref 3.5–5.1)
Sodium: 133 mmol/L — ABNORMAL LOW (ref 135–145)

## 2015-06-14 LAB — APTT: aPTT: 39 seconds — ABNORMAL HIGH (ref 24–37)

## 2015-06-14 LAB — LACTIC ACID, PLASMA
Lactic Acid, Venous: 1.2 mmol/L (ref 0.5–2.0)
Lactic Acid, Venous: 1.7 mmol/L (ref 0.5–2.0)

## 2015-06-14 LAB — MRSA PCR SCREENING: MRSA by PCR: POSITIVE — AB

## 2015-06-14 LAB — PROTIME-INR
INR: 1.39 (ref 0.00–1.49)
Prothrombin Time: 17.1 seconds — ABNORMAL HIGH (ref 11.6–15.2)

## 2015-06-14 LAB — PROCALCITONIN: Procalcitonin: 7.01 ng/mL

## 2015-06-14 MED ORDER — SODIUM CHLORIDE 0.9 % IJ SOLN
3.0000 mL | Freq: Two times a day (BID) | INTRAMUSCULAR | Status: DC
Start: 2015-06-14 — End: 2015-06-18
  Administered 2015-06-14 – 2015-06-18 (×9): 3 mL via INTRAVENOUS

## 2015-06-14 MED ORDER — MORPHINE SULFATE 2 MG/ML IJ SOLN
1.0000 mg | INTRAMUSCULAR | Status: DC | PRN
  Administered 2015-06-14 (×2): 1 mg via INTRAVENOUS
  Filled 2015-06-14 (×3): qty 1

## 2015-06-14 MED ORDER — ACETAMINOPHEN 325 MG PO TABS
650.0000 mg | ORAL_TABLET | Freq: Four times a day (QID) | ORAL | Status: DC | PRN
  Filled 2015-06-14: qty 2

## 2015-06-14 MED ORDER — ONDANSETRON HCL 4 MG/2ML IJ SOLN
4.0000 mg | Freq: Four times a day (QID) | INTRAMUSCULAR | Status: DC | PRN
Start: 2015-06-14 — End: 2015-06-18

## 2015-06-14 MED ORDER — HEPARIN SODIUM (PORCINE) 5000 UNIT/ML IJ SOLN
5000.0000 [IU] | Freq: Three times a day (TID) | INTRAMUSCULAR | Status: DC
  Administered 2015-06-14 – 2015-06-18 (×13): 5000 [IU] via SUBCUTANEOUS
  Filled 2015-06-14 (×18): qty 1

## 2015-06-14 MED ORDER — SODIUM CHLORIDE 0.9 % IV SOLN
INTRAVENOUS | Status: DC
  Administered 2015-06-14 (×2): via INTRAVENOUS
  Administered 2015-06-16: 1000 mL via INTRAVENOUS

## 2015-06-14 MED ORDER — CHLORHEXIDINE GLUCONATE CLOTH 2 % EX PADS
6.0000 | MEDICATED_PAD | Freq: Every day | CUTANEOUS | Status: AC
  Administered 2015-06-14 – 2015-06-18 (×5): 6 via TOPICAL

## 2015-06-14 MED ORDER — ONDANSETRON HCL 4 MG PO TABS: 4.0000 mg | ORAL_TABLET | Freq: Four times a day (QID) | ORAL | Status: DC | PRN

## 2015-06-14 MED ORDER — ACETAMINOPHEN 650 MG RE SUPP: 650.0000 mg | Freq: Four times a day (QID) | RECTAL | Status: DC | PRN

## 2015-06-14 MED ORDER — MUPIROCIN 2 % EX OINT
1.0000 "application " | TOPICAL_OINTMENT | Freq: Two times a day (BID) | CUTANEOUS | Status: AC
  Administered 2015-06-14 – 2015-06-18 (×10): 1 via NASAL
  Filled 2015-06-14 (×2): qty 22

## 2015-06-14 MED ORDER — POTASSIUM CHLORIDE 10 MEQ/100ML IV SOLN
10.0000 meq | INTRAVENOUS | Status: AC
  Administered 2015-06-14 (×3): 10 meq via INTRAVENOUS
  Filled 2015-06-14 (×3): qty 100

## 2015-06-14 NOTE — Progress Notes (Addendum)
UR COMPLETED  

## 2015-06-14 NOTE — Progress Notes (Signed)
[facility] Progress Note     Subjective: Pt doing okay, but c/o pain in RLQ/suprapubic region.  No N/V.  Having flatus, but no BM.  Wants to ambulate more.  Wife at bedside.     Objective: Vital signs in last 24 hours: Temp:  [98 F (36.7 C)-100.6 F (38.1 C)] 98.1 F (36.7 C) (08/15 0700) Pulse Rate:  [48-50] 50 (08/15 0749) Resp:  [15-21] 20 (08/15 0749) BP: (103-149)/(38-69) 115/46 mmHg (08/15 0749) SpO2:  [92 %-100 %] 94 % (08/15 0749) Weight:  [96.6 kg (212 lb 15.4 oz)-99 kg (218 lb 4.1 oz)] 99 kg (218 lb 4.1 oz) (08/15 0500) Last BM Date: 06/14/15  Intake/Output from previous day: 08/14 0701 - 08/15 0700 In: 2875 [I.V.:75; IV Piggyback:2800] Out: -  Intake/Output this shift: Total I/O In: 150 [I.V.:150] Out: -   PE: Gen:  Alert, NAD, pleasant Card:  RRR, no M/G/R heard Pulm:  CTA, no W/R/R Abd: Soft, ND, tender in RLQ/suprapubic, +BS, no HSM   Lab Results:   Recent Labs  06/13/15 1615 06/14/15 0225  WBC 15.0* 19.7*  HGB 14.5 11.7*  HCT 42.8 35.6*  PLT 249 182   BMET  Recent Labs  06/13/15 2325 06/14/15 0225  NA 130* 133*  K 3.1* 3.3*  CL 94* 100*  CO2 24 25  GLUCOSE 117* 101*  BUN 17 17  CREATININE 1.04 1.04  CALCIUM 8.4* 7.9*   PT/INR  Recent Labs  06/13/15 2325  LABPROT 17.1*  INR 1.39   CMP     Component Value Date/Time   NA 133* 06/14/2015 0225   K 3.3* 06/14/2015 0225   CL 100* 06/14/2015 0225   CO2 25 06/14/2015 0225   GLUCOSE 101* 06/14/2015 0225   BUN 17 06/14/2015 0225   CREATININE 1.04 06/14/2015 0225   CREATININE 0.71 03/20/2011 1014   CALCIUM 7.9* 06/14/2015 0225   PROT 6.6 06/13/2015 2325   ALBUMIN 3.0* 06/13/2015 2325   AST 16 06/13/2015 2325   ALT 11* 06/13/2015 2325   ALKPHOS 75 06/13/2015 2325   BILITOT 3.2* 06/13/2015 2325   GFRNONAA >60 06/14/2015 0225   GFRAA >60 06/14/2015 0225   Lipase     Component Value Date/Time   LIPASE 13* 06/13/2015 1615       Studies/Results: Ct  Abdomen Pelvis W Contrast  06/13/2015   CLINICAL DATA:  Acute onset of generalized abdominal pain and vomiting. Fever. Initial encounter.  EXAM: CT ABDOMEN AND PELVIS WITH CONTRAST  TECHNIQUE: Multidetector CT imaging of the abdomen and pelvis was performed using the standard protocol following bolus administration of intravenous contrast.  CONTRAST:  11mL OMNIPAQUE IOHEXOL 300 MG/ML  SOLN  COMPARISON:  None.  FINDINGS: The visualized lung bases are clear. Scattered coronary artery calcifications are seen. A pacemaker lead is partially imaged.  The liver and spleen are unremarkable in appearance. The gallbladder is within normal limits. The pancreas and adrenal glands are unremarkable.  Nonspecific perinephric stranding is noted bilaterally. Right renal cysts measure up to 2.4 cm in size. There is no evidence of hydronephrosis. No renal or ureteral stones are seen.  No free fluid is identified. The small bowel is unremarkable in appearance. The stomach is within normal limits. No acute vascular abnormalities are seen. Scattered calcification is noted along the distal abdominal aorta and its branches.  A moderate umbilical hernia is seen, containing only fat.  There appears to be a localized perforation at the right mid sigmoid colon, with scattered soft tissue air  tracking into the surrounding fat, and extensive surrounding soft tissue inflammation and trace fluid. A small amount of peripherally enhancing fluid is noted, measuring up to 1.5 cm, without a definite drainable abscess. Diffuse soft tissue edema extends superiorly along the mesentery, and into the pelvis, with inflammation tracking about the decompressed bladder. Soft tissue inflammation extends about adjacent small bowel loops.  This reflects acute diverticulitis at the mid sigmoid colon, with associated colonic wall thickening. Underlying diverticulosis involves the distal descending and sigmoid colon.  The appendix is normal in caliber, partially  obscured by surrounding soft tissue inflammation.  The bladder is decompressed and not well assessed. The prostate is mildly enlarged, measuring 4.9 cm in transverse dimension. No inguinal lymphadenopathy is seen.  No acute osseous abnormalities are identified.  IMPRESSION: 1. Localized perforation at the right mid sigmoid colon, with scattered soft tissue air tracking into the surrounding fat, and extensive surrounding soft tissue inflammation and trace fluid. Small peripherally enhancing fluid collection measuring 1.5 cm noted, without a definite drainable abscess. Diffuse soft tissue edema extends superiorly along the mesentery, and into the pelvis, with inflammation tracking about the decompressed bladder. Soft tissue inflammation tracks about adjacent small bowel loops. This reflects acute diverticulitis at the mid sigmoid colon, with associated colonic wall thickening. 2. Underlying diverticulosis involves the distal descending and sigmoid colon. 3. Mildly enlarged prostate. 4. Right renal cyst noted. 5. Scattered coronary artery calcifications noted. 6. Moderate umbilical hernia, containing only fat. 7. Scattered calcification along the distal abdominal aorta and its branches. Critical Value/emergent results were called by telephone at the time of interpretation on 06/13/2015 at 9:49 pm to Quincy Carnes PA, who verbally acknowledged these results.   Electronically Signed   By: Garald Balding M.D.   On: 06/13/2015 21:57    Anti-infectives: Anti-infectives    Start     Dose/Rate Route Frequency Ordered Stop   06/14/15 1100  ciprofloxacin (CIPRO) IVPB 400 mg     400 mg 200 mL/hr over 60 Minutes Intravenous Every 12 hours 06/13/15 2358     06/14/15 0600  metroNIDAZOLE (FLAGYL) IVPB 500 mg     500 mg 100 mL/hr over 60 Minutes Intravenous Every 8 hours 06/13/15 2359     06/13/15 2315  ciprofloxacin (CIPRO) IVPB 400 mg  Status:  Discontinued     400 mg 200 mL/hr over 60 Minutes Intravenous Every 12 hours  06/13/15 2303 06/13/15 2358   06/13/15 2315  metroNIDAZOLE (FLAGYL) IVPB 500 mg  Status:  Discontinued     500 mg 100 mL/hr over 60 Minutes Intravenous Every 8 hours 06/13/15 2303 06/13/15 2359   06/13/15 2145  ciprofloxacin (CIPRO) IVPB 400 mg     400 mg 200 mL/hr over 60 Minutes Intravenous  Once 06/13/15 2144 06/14/15 0024   06/13/15 2145  metroNIDAZOLE (FLAGYL) IVPB 500 mg     500 mg 100 mL/hr over 60 Minutes Intravenous  Once 06/13/15 2144 06/13/15 2333       Assessment/Plan Sigmoid diverticulitis with localized perforation -Medical management:  IV antibiotics (Cipro/flagyl Day #2), NPO, bowel rest, IVF, pain control, antiemetics -Hopefully he will not require surgical intervention if he improves each day. -If not improvement in 48-72 hours may need surgical intervention -Ambulate and IS -SCD's and SQ heparin  Umbilical hernia - asymptomatic    LOS: 1 day    Nat Christen 06/14/2015, 12:07 PM Pager: [phone]

## 2015-06-14 NOTE — Progress Notes (Signed)
Pt transferred from 3S. Pt brought to the floor in stable condition. Vitals taken. All immediate pertinent needs to patient addressed. Patient Guide given to patient. Important safety instructions relating to hospitalization reviewed with patient. Patient verbalized understanding. Will continue to monitor pt.  Maurene Capes RN

## 2015-06-14 NOTE — Consult Note (Signed)
WOC wound consult note Reason for Consult: Consult requested for right foot wound.  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 Wound type: Full thickness Measurement: 3X3X.5cm Wound bed: outer edge of wound is black dried scabbed eschar (10%) Drainage (amount, consistency, odor) Inner wound is 80% moist red woundbed, 10% yellow slough Dressing procedure/placement/frequency: Continue present plan of care with Aquacel every other day to provide antimicrobial benefits and absorb drainage.  Pt is well informed regarding topical treatment and can resume follow-up with the outpatient wound care center after discharge. Please re-consult if further assistance is needed.  Thank-you,  Julien Girt MSN, [place], [place], [place], [place]

## 2015-06-14 NOTE — Care Management Note (Addendum)
Case Management Note  Patient Details  Name: ELRAY DAINS MRN: [id number] Date of Birth: [birth year]  Subjective/Objective:                 PTA from home with wife admitted with Sigmoid diverticulitis with localized small perforation. 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 ADLS independent prior to admit.   Action/Plan: Discharge Planning  Expected Discharge Date:                  Expected Discharge Plan:  Home/Self Care  In-House Referral:     Discharge planning Services  CM Consult  Post Acute Care Choice:    Choice offered to:     DME Arranged:    DME Agency:     HH Arranged:    HH Agency:     Status of Service:  In process, will continue to follow  Medicare Important Message Given:    Date Medicare IM Given:    Medicare IM give by:    Date Additional Medicare IM Given:    Additional Medicare Important Message give by:     If discussed at [place] of Stay Meetings, dates discussed:    Additional Comments: Nazire Fruth [phone], Zahid Carneiro [facility])  [phone]  Whitman Hero [place], [place], [facility] [phone] 06/14/2015, 11:43 AM

## 2015-06-14 NOTE — Progress Notes (Signed)
[place] Progress Note Patient Name: Nathan Miller DOB: 01/13/1951 MRN: [id number]   Date of Service  06/14/2015  HPI/Events of Note  Rising LA & bowel per from diverticulitis.  eICU Interventions  Patient on Cipro & Flagyl. Resting comfortably in bed. No Nausea.     Intervention Category Evaluation Type: New Patient Evaluation  Tera Partridge 06/14/2015, 12:43 AM

## 2015-06-14 NOTE — Progress Notes (Signed)
[facility] PROGRESS NOTE  Nathan Miller UVO:[id number] DOB: 1951/06/30 DOA: 06/13/2015 PCP: Rose Fillers, PA-C  Assessment/Plan: 1- Sepsis: due to diverticulitis with intestinal perforation -improving -no febrile and with less abd pain; abd is essentially benign on exam -still with elevated WBC's -will continue NPO, continue Abx's and will follow CCS rec's  2-HTN: stable currently -will monitor VS -Lisinopril and coreg held while NPO, and due to bradycardia  3-hx of complete heart block: status post pacemaker -will monitor on telemetry -holding B-blocker currently  4-hypokalemia: will replete as needed and check magnesium    5-CAD: s/p PCI -will hold ASA and statins while NPO  6-diabetes mellitus type 2: on insulin therapy -will use SSI -holding lantus while NPO -will monitor and adjust hypoglycemic regimen as needed    Code Status: Full Family Communication: no family at bedisde Disposition Plan: will continue IV antibiotics, follow CCS rec's. Will transfer out of stepdown unit to telemetry bed   Consultants:  CCS   Procedures:  See below for x-ray reports   Antibiotics:  Ciprofloxacin  8/14  Flagyl 8/14  HPI/Subjective: Afebrile; feeling better. No nausea, no vomiting and with improvement in his abd pain.  Objective: Filed Vitals:   06/14/15 0749  BP: 115/46  Pulse: 50  Temp:   Resp: 20    Intake/Output Summary (Last 24 hours) at 06/14/15 0926 Last data filed at 06/14/15 0900  Gross per 24 hour  Intake   3025 ml  Output      0 ml  Net   3025 ml   Filed Weights   06/13/15 2358 06/14/15 0500  Weight: 96.6 kg (212 lb 15.4 oz) 99 kg (218 lb 4.1 oz)    Exam:   General:  Afebrile, no CP, no nausea, no vomiting and complaining just of mild abd pain.  Cardiovascular: bradycardia, no rubs, no gallops; no JVD  Respiratory: CTA bilaterally   Abdomen: soft, no guarding, bilateral lower quadrant pain (R > L); positive  BS  Musculoskeletal: no edema, no cyanosis. Right foot with chronic open wound (from osteomyelitis); no purulent drainage appreciated. Positive sanguineous expression, per patient not new.  Data Reviewed: Basic Metabolic Panel:  Recent Labs Lab 06/13/15 1615 06/13/15 2325 06/14/15 0225  NA 131* 130* 133*  K 3.7 3.1* 3.3*  CL 92* 94* 100*  CO2 29 24 25   GLUCOSE 134* 117* 101*  BUN 13 17 17   CREATININE 0.92 1.04 1.04  CALCIUM 9.1 8.4* 7.9*   Liver Function Tests:  Recent Labs Lab 06/13/15 1615 06/13/15 2325  AST 18 16  ALT 13* 11*  ALKPHOS 96 75  BILITOT 3.0* 3.2*  PROT 7.3 6.6  ALBUMIN 3.4* 3.0*    Recent Labs Lab 06/13/15 1615  LIPASE 13*   CBC:  Recent Labs Lab 06/13/15 1615 06/14/15 0225  WBC 15.0* 19.7*  HGB 14.5 11.7*  HCT 42.8 35.6*  MCV 85.3 87.0  PLT 249 182    CBG: No results for input(s): GLUCAP in the last 168 hours.  Recent Results (from the past 240 hour(s))  Urine culture     Status: None (Preliminary result)   Collection Time: 06/13/15  9:00 PM  Result Value Ref Range Status   Specimen Description URINE, RANDOM  Final   Special Requests ADDED 0247 06/14/15  Final   Culture PENDING  Incomplete   Report Status PENDING  Incomplete  Culture, blood (routine x 2)     Status: None (Preliminary result)   Collection Time: 06/13/15 10:10 PM  Result Value Ref Range Status   Specimen Description BLOOD RIGHT FOREARM  Final   Special Requests BOTTLES DRAWN AEROBIC AND ANAEROBIC 5CC EACH  Final   Culture PENDING  Incomplete   Report Status PENDING  Incomplete  MRSA PCR Screening     Status: Abnormal   Collection Time: 06/14/15 12:01 AM  Result Value Ref Range Status   MRSA by PCR POSITIVE (A) NEGATIVE Final    Comment:        The GeneXpert MRSA Assay (FDA approved for NASAL specimens only), is one component of a comprehensive MRSA colonization surveillance program. It is not intended to diagnose MRSA infection nor to guide or monitor  treatment for MRSA infections. RESULT CALLED TO, READ BACK BY AND VERIFIED WITH: [facility] RN 0255 06/14/15 MITCHELL,L      Studies: Ct Abdomen Pelvis W Contrast  06/13/2015   CLINICAL DATA:  Acute onset of generalized abdominal pain and vomiting. Fever. Initial encounter.  EXAM: CT ABDOMEN AND PELVIS WITH CONTRAST  TECHNIQUE: Multidetector CT imaging of the abdomen and pelvis was performed using the standard protocol following bolus administration of intravenous contrast.  CONTRAST:  OMNIPAQUE IOHEXOL 300 MG/ML  SOLN  COMPARISON:  None.  FINDINGS: The visualized lung bases are clear. Scattered coronary artery calcifications are seen. A pacemaker lead is partially imaged.  The liver and spleen are unremarkable in appearance. The gallbladder is within normal limits. The pancreas and adrenal glands are unremarkable.  Nonspecific perinephric stranding is noted bilaterally. Right renal cysts measure up to 2.4 cm in size. There is no evidence of hydronephrosis. No renal or ureteral stones are seen.  No free fluid is identified. The small bowel is unremarkable in appearance. The stomach is within normal limits. No acute vascular abnormalities are seen. Scattered calcification is noted along the distal abdominal aorta and its branches.  A moderate umbilical hernia is seen, containing only fat.  There appears to be a localized perforation at the right mid sigmoid colon, with scattered soft tissue air tracking into the surrounding fat, and extensive surrounding soft tissue inflammation and trace fluid. A small amount of peripherally enhancing fluid is noted, measuring up to 1.5 cm, without a definite drainable abscess. Diffuse soft tissue edema extends superiorly along the mesentery, and into the pelvis, with inflammation tracking about the decompressed bladder. Soft tissue inflammation extends about adjacent small bowel loops.  This reflects acute diverticulitis at the mid sigmoid colon, with associated colonic  wall thickening. Underlying diverticulosis involves the distal descending and sigmoid colon.  The appendix is normal in caliber, partially obscured by surrounding soft tissue inflammation.  The bladder is decompressed and not well assessed. The prostate is mildly enlarged, measuring 4.9 cm in transverse dimension. No inguinal lymphadenopathy is seen.  No acute osseous abnormalities are identified.  IMPRESSION: 1. Localized perforation at the right mid sigmoid colon, with scattered soft tissue air tracking into the surrounding fat, and extensive surrounding soft tissue inflammation and trace fluid. Small peripherally enhancing fluid collection measuring 1.5 cm noted, without a definite drainable abscess. Diffuse soft tissue edema extends superiorly along the mesentery, and into the pelvis, with inflammation tracking about the decompressed bladder. Soft tissue inflammation tracks about adjacent small bowel loops. This reflects acute diverticulitis at the mid sigmoid colon, with associated colonic wall thickening. 2. Underlying diverticulosis involves the distal descending and sigmoid colon. 3. Mildly enlarged prostate. 4. Right renal cyst noted. 5. Scattered coronary artery calcifications noted. 6. Moderate umbilical hernia, containing only fat. 7. Scattered  calcification along the distal abdominal aorta and its branches. Critical Value/emergent results were called by telephone at the time of interpretation on 06/13/2015 at 9:49 pm to Sharilyn Sites PA, who verbally acknowledged these results.   Electronically Signed   By: Roanna Raider M.D.   On: 06/13/2015 21:57    Scheduled Meds: . Chlorhexidine Gluconate Cloth  6 each Topical Q0600  . ciprofloxacin  400 mg Intravenous Q12H  . heparin  5,000 Units Subcutaneous 3 times per day  . insulin aspart  0-9 Units Subcutaneous TID WC  . metronidazole  500 mg Intravenous Q8H  . mupirocin ointment  1 application Nasal BID  . pantoprazole (PROTONIX) IV  40 mg  Intravenous Q12H  . potassium chloride  10 mEq Intravenous Q1 Hr x 3  . sodium chloride  3 mL Intravenous Q12H   Continuous Infusions: . sodium chloride 75 mL/hr at 06/14/15 0700    Principal Problem:   Diverticulitis of intestine with perforation Active Problems:   CAD S/P percutaneous coronary angioplasty; bms X 2 to RCA 2001   Bradycardia   Complete heart block   Diabetes mellitus type 2, uncontrolled   Diverticulitis    Time spent: 40 minutes (> 50% of time dedicated to face to face examination, data review, coordination of care and discussion with other specialists)    Vassie Loll  [facility] Pager [phone]. If 7PM-7AM, please contact night-coverage at [email or web address], password [facility] 06/14/2015, 9:26 AM  LOS: 1 day

## 2015-06-15 DIAGNOSIS — M869 Osteomyelitis, unspecified: Secondary | ICD-10-CM

## 2015-06-15 LAB — BASIC METABOLIC PANEL
Anion gap: 7 (ref 5–15)
BUN: 9 mg/dL (ref 6–20)
CO2: 25 mmol/L (ref 22–32)
Calcium: 8.2 mg/dL — ABNORMAL LOW (ref 8.9–10.3)
Chloride: 100 mmol/L — ABNORMAL LOW (ref 101–111)
Creatinine, Ser: 0.89 mg/dL (ref 0.61–1.24)
GFR calc Af Amer: >60 mL/min (ref >60)
GFR calc non Af Amer: >60 mL/min (ref >60)
Glucose, Bld: 89 mg/dL (ref 65–99)
Potassium: 3.9 mmol/L (ref 3.5–5.1)
Sodium: 132 mmol/L — ABNORMAL LOW (ref 135–145)

## 2015-06-15 LAB — GLUCOSE, CAPILLARY
Glucose-Capillary: 79 mg/dL (ref 65–99)
Glucose-Capillary: 79 mg/dL (ref 65–99)
Glucose-Capillary: 100 mg/dL — ABNORMAL HIGH (ref 65–99)
Glucose-Capillary: 102 mg/dL — ABNORMAL HIGH (ref 65–99)

## 2015-06-15 LAB — CBC
HCT: 34.1 % — ABNORMAL LOW (ref 39.0–52.0)
Hemoglobin: 11.3 g/dL — ABNORMAL LOW (ref 13.0–17.0)
MCH: 28.4 pg (ref 26.0–34.0)
MCHC: 33.1 g/dL (ref 30.0–36.0)
MCV: 85.7 fL (ref 78.0–100.0)
Platelets: 157 10*3/uL (ref 150–400)
RBC: 3.98 MIL/uL — ABNORMAL LOW (ref 4.22–5.81)
RDW: 14.6 % (ref 11.5–15.5)
WBC: 13.5 10*3/uL — ABNORMAL HIGH (ref 4.0–10.5)

## 2015-06-15 LAB — MAGNESIUM: Magnesium: 1.7 mg/dL (ref 1.7–2.4)

## 2015-06-15 LAB — URINE CULTURE

## 2015-06-15 MED ORDER — INSULIN GLARGINE 100 UNIT/ML ~~LOC~~ SOLN
5.0000 [IU] | Freq: Every day | SUBCUTANEOUS | Status: DC
  Filled 2015-06-15 (×4): qty 0.05

## 2015-06-15 MED ORDER — CETYLPYRIDINIUM CHLORIDE 0.05 % MT LIQD
7.0000 mL | Freq: Two times a day (BID) | OROMUCOSAL | Status: DC
  Administered 2015-06-15 – 2015-06-18 (×6): 7 mL via OROMUCOSAL

## 2015-06-15 MED ORDER — ZOLPIDEM TARTRATE 5 MG PO TABS
5.0000 mg | ORAL_TABLET | Freq: Once | ORAL | Status: AC
  Administered 2015-06-15: 5 mg via ORAL
  Filled 2015-06-15: qty 1

## 2015-06-15 NOTE — Progress Notes (Signed)
[facility] PROGRESS NOTE  Nathan Miller OZH:[id number] DOB: [birth year] DOA: 06/13/2015 PCP: Rose Fillers, PA-C  Assessment/Plan: 1- Sepsis: due to diverticulitis with intestinal perforation -continue improving -no febrile and with less abd pain -still with elevated WBC's, but trending down -will advance to CLD, continue IV Abx's and will follow CCS rec's  2-HTN: stable currently -will monitor VS -Lisinopril and coreg held while still tolerating PO's, and due to bradycardia  3-hx of complete heart block: status post pacemaker -will monitor on telemetry -will continue holding B-blocker currently, HR stable -will reassess in 24 hours  4-hypokalemia: will replete as needed and check magnesium    5-CAD: s/p PCI -will continue holding ASA and statins while tolerating PO  6-diabetes mellitus type 2: on insulin therapy -will use SSI -will add low dose lantus as patient will be allow CLD -will monitor and adjust hypoglycemic regimen as needed    Code Status: Full Family Communication: no family at bedisde Disposition Plan: will continue IV antibiotics, follow CCS rec's. Will transfer out of stepdown unit to telemetry bed   Consultants:  CCS   Procedures:  See below for x-ray reports   Antibiotics:  Ciprofloxacin  8/14  Flagyl 8/14  HPI/Subjective: Afebrile; feeling better. No nausea, no vomiting and endorses continue improvement on his abd pain.  Objective: Filed Vitals:   06/15/15 1000  BP: 132/65  Pulse: 52  Temp: 98.4 F (36.9 C)  Resp:     Intake/Output Summary (Last 24 hours) at 06/15/15 1323 Last data filed at 06/15/15 1300  Gross per 24 hour  Intake   2440 ml  Output    950 ml  Net   1490 ml   Filed Weights   06/14/15 0500 06/14/15 1525 06/15/15 0626  Weight: 99 kg (218 lb 4.1 oz) 96.026 kg (211 lb 11.2 oz) 95.573 kg (210 lb 11.2 oz)    Exam:   General:  Afebrile, no CP, no nausea, no vomiting and reports abd pain continue  improving.   Cardiovascular: bradycardia, no rubs, no gallops; no JVD  Respiratory: CTA bilaterally   Abdomen: soft, no guarding, still with some bilateral lower quadrant pain (R > L), but improved from yesterday (8/15) exam; positive BS  Musculoskeletal: no edema, no cyanosis. Right foot with chronic open wound (from osteomyelitis); no purulent drainage appreciated. Positive sanguineous expression, per patient not new.  Data Reviewed: Basic Metabolic Panel:  Recent Labs Lab 06/13/15 1615 06/13/15 2325 06/14/15 0225 06/15/15 0347  NA 131* 130* 133* 132*  K 3.7 3.1* 3.3* 3.9  CL 92* 94* 100* 100*  CO2 29 24 25 25   GLUCOSE 134* 117* 101* 89  BUN 13 17 17 9   CREATININE 0.92 1.04 1.04 0.89  CALCIUM 9.1 8.4* 7.9* 8.2*  MG  --   --   --  1.7   Liver Function Tests:  Recent Labs Lab 06/13/15 1615 06/13/15 2325  AST 18 16  ALT 13* 11*  ALKPHOS 96 75  BILITOT 3.0* 3.2*  PROT 7.3 6.6  ALBUMIN 3.4* 3.0*    Recent Labs Lab 06/13/15 1615  LIPASE 13*   CBC:  Recent Labs Lab 06/13/15 1615 06/14/15 0225 06/15/15 0347  WBC 15.0* 19.7* 13.5*  HGB 14.5 11.7* 11.3*  HCT 42.8 35.6* 34.1*  MCV 85.3 87.0 85.7  PLT 249 182 157    CBG:  Recent Labs Lab 06/14/15 0747 06/14/15 1259 06/14/15 1624 06/14/15 2103 06/15/15 0622  GLUCAP 95 84 96 105* 79    Recent Results (  from the past 240 hour(s))  Urine culture     Status: None (Preliminary result)   Collection Time: 06/13/15  9:00 PM  Result Value Ref Range Status   Specimen Description URINE, RANDOM  Final   Special Requests ADDED 0247 06/14/15  Final   Culture PENDING  Incomplete   Report Status PENDING  Incomplete  Culture, blood (routine x 2)     Status: None (Preliminary result)   Collection Time: 06/13/15 10:10 PM  Result Value Ref Range Status   Specimen Description BLOOD RIGHT FOREARM  Final   Special Requests BOTTLES DRAWN AEROBIC AND ANAEROBIC 5CC  Final   Culture NO GROWTH 2 DAYS  Final   Report  Status PENDING  Incomplete  MRSA PCR Screening     Status: Abnormal   Collection Time: 06/14/15 12:01 AM  Result Value Ref Range Status   MRSA by PCR POSITIVE (A) NEGATIVE Final    Comment:        The GeneXpert MRSA Assay (FDA approved for NASAL specimens only), is one component of a comprehensive MRSA colonization surveillance program. It is not intended to diagnose MRSA infection nor to guide or monitor treatment for MRSA infections. RESULT CALLED TO, READ BACK BY AND VERIFIED WITH: [facility] RN 0255 06/14/15 MITCHELL,L      Studies: Ct Abdomen Pelvis W Contrast  06/13/2015   CLINICAL DATA:  Acute onset of generalized abdominal pain and vomiting. Fever. Initial encounter.  EXAM: CT ABDOMEN AND PELVIS WITH CONTRAST  TECHNIQUE: Multidetector CT imaging of the abdomen and pelvis was performed using the standard protocol following bolus administration of intravenous contrast.  CONTRAST:  OMNIPAQUE IOHEXOL 300 MG/ML  SOLN  COMPARISON:  None.  FINDINGS: The visualized lung bases are clear. Scattered coronary artery calcifications are seen. A pacemaker lead is partially imaged.  The liver and spleen are unremarkable in appearance. The gallbladder is within normal limits. The pancreas and adrenal glands are unremarkable.  Nonspecific perinephric stranding is noted bilaterally. Right renal cysts measure up to 2.4 cm in size. There is no evidence of hydronephrosis. No renal or ureteral stones are seen.  No free fluid is identified. The small bowel is unremarkable in appearance. The stomach is within normal limits. No acute vascular abnormalities are seen. Scattered calcification is noted along the distal abdominal aorta and its branches.  A moderate umbilical hernia is seen, containing only fat.  There appears to be a localized perforation at the right mid sigmoid colon, with scattered soft tissue air tracking into the surrounding fat, and extensive surrounding soft tissue inflammation and trace  fluid. A small amount of peripherally enhancing fluid is noted, measuring up to 1.5 cm, without a definite drainable abscess. Diffuse soft tissue edema extends superiorly along the mesentery, and into the pelvis, with inflammation tracking about the decompressed bladder. Soft tissue inflammation extends about adjacent small bowel loops.  This reflects acute diverticulitis at the mid sigmoid colon, with associated colonic wall thickening. Underlying diverticulosis involves the distal descending and sigmoid colon.  The appendix is normal in caliber, partially obscured by surrounding soft tissue inflammation.  The bladder is decompressed and not well assessed. The prostate is mildly enlarged, measuring 4.9 cm in transverse dimension. No inguinal lymphadenopathy is seen.  No acute osseous abnormalities are identified.  IMPRESSION: 1. Localized perforation at the right mid sigmoid colon, with scattered soft tissue air tracking into the surrounding fat, and extensive surrounding soft tissue inflammation and trace fluid. Small peripherally enhancing fluid collection measuring 1.5  cm noted, without a definite drainable abscess. Diffuse soft tissue edema extends superiorly along the mesentery, and into the pelvis, with inflammation tracking about the decompressed bladder. Soft tissue inflammation tracks about adjacent small bowel loops. This reflects acute diverticulitis at the mid sigmoid colon, with associated colonic wall thickening. 2. Underlying diverticulosis involves the distal descending and sigmoid colon. 3. Mildly enlarged prostate. 4. Right renal cyst noted. 5. Scattered coronary artery calcifications noted. 6. Moderate umbilical hernia, containing only fat. 7. Scattered calcification along the distal abdominal aorta and its branches. Critical Value/emergent results were called by telephone at the time of interpretation on 06/13/2015 at 9:49 pm to Sharilyn Sites PA, who verbally acknowledged these results.    Electronically Signed   By: Roanna Raider M.D.   On: 06/13/2015 21:57    Scheduled Meds: . antiseptic oral rinse  7 mL Mouth Rinse BID  . Chlorhexidine Gluconate Cloth  6 each Topical Q0600  . ciprofloxacin  400 mg Intravenous Q12H  . heparin  5,000 Units Subcutaneous 3 times per day  . insulin aspart  0-9 Units Subcutaneous TID WC  . metronidazole  500 mg Intravenous Q8H  . mupirocin ointment  1 application Nasal BID  . pantoprazole (PROTONIX) IV  40 mg Intravenous Q12H  . sodium chloride  3 mL Intravenous Q12H   Continuous Infusions: . sodium chloride 75 mL/hr at 06/14/15 2105    Principal Problem:   Diverticulitis of intestine with perforation Active Problems:   CAD S/P percutaneous coronary angioplasty; bms X 2 to RCA 2001   Bradycardia   Complete heart block   Diabetes mellitus type 2, uncontrolled   Diverticulitis   Diverticulitis of intestine with perforation without bleeding   Blood poisoning    Time spent: 30 minutes    Vassie Loll  [facility] Pager [phone]. If 7PM-7AM, please contact night-coverage at [email or web address], password [facility] 06/15/2015, 1:23 PM  LOS: 2 days

## 2015-06-15 NOTE — Progress Notes (Signed)
Patient ID: Nathan Miller, male   DOB: Jul 10, 1951, 64 y.o.   MRN: [id number]    Subjective: Pt looks well and feels well.  No pain. Just had BM.  No nausea  Objective: Vital signs in last 24 hours: Temp:  [97.9 F (36.6 C)-99.2 F (37.3 C)] 98.4 F (36.9 C) (08/16 1000) Pulse Rate:  [49-52] 52 (08/16 1000) Resp:  [16-18] 17 (08/16 0800) BP: (131-149)/(49-66) 132/65 mmHg (08/16 1000) SpO2:  [97 %-100 %] 98 % (08/16 1000) Weight:  [95.573 kg (210 lb 11.2 oz)-96.026 kg (211 lb 11.2 oz)] 95.573 kg (210 lb 11.2 oz) (08/16 0626) Last BM Date: 06/14/15  Intake/Output from previous day: 08/15 0701 - 08/16 0700 In: 2625 [I.V.:1725; IV Piggyback:900] Out: 400 [Urine:400] Intake/Output this shift: Total I/O In: 0  Out: 300 [Urine:300]  PE: Abd: soft, NT, ND, +BS Heart: regular Lungs: CTAB  Lab Results:   Recent Labs  06/14/15 0225 06/15/15 0347  WBC 19.7* 13.5*  HGB 11.7* 11.3*  HCT 35.6* 34.1*  PLT 182 157   BMET  Recent Labs  06/14/15 0225 06/15/15 0347  NA 133* 132*  K 3.3* 3.9  CL 100* 100*  CO2 25 25  GLUCOSE 101* 89  BUN 17 9  CREATININE 1.04 0.89  CALCIUM 7.9* 8.2*   PT/INR  Recent Labs  06/13/15 2325  LABPROT 17.1*  INR 1.39   CMP     Component Value Date/Time   NA 132* 06/15/2015 0347   K 3.9 06/15/2015 0347   CL 100* 06/15/2015 0347   CO2 25 06/15/2015 0347   GLUCOSE 89 06/15/2015 0347   BUN 9 06/15/2015 0347   CREATININE 0.89 06/15/2015 0347   CREATININE 0.71 03/20/2011 1014   CALCIUM 8.2* 06/15/2015 0347   PROT 6.6 06/13/2015 2325   ALBUMIN 3.0* 06/13/2015 2325   AST 16 06/13/2015 2325   ALT 11* 06/13/2015 2325   ALKPHOS 75 06/13/2015 2325   BILITOT 3.2* 06/13/2015 2325   GFRNONAA >60 06/15/2015 0347   GFRAA >60 06/15/2015 0347   Lipase     Component Value Date/Time   LIPASE 13* 06/13/2015 1615       Studies/Results: Ct Abdomen Pelvis W Contrast  06/13/2015   CLINICAL DATA:  Acute onset of generalized abdominal pain and  vomiting. Fever. Initial encounter.  EXAM: CT ABDOMEN AND PELVIS WITH CONTRAST  TECHNIQUE: Multidetector CT imaging of the abdomen and pelvis was performed using the standard protocol following bolus administration of intravenous contrast.  CONTRAST:  OMNIPAQUE IOHEXOL 300 MG/ML  SOLN  COMPARISON:  None.  FINDINGS: The visualized lung bases are clear. Scattered coronary artery calcifications are seen. A pacemaker lead is partially imaged.  The liver and spleen are unremarkable in appearance. The gallbladder is within normal limits. The pancreas and adrenal glands are unremarkable.  Nonspecific perinephric stranding is noted bilaterally. Right renal cysts measure up to 2.4 cm in size. There is no evidence of hydronephrosis. No renal or ureteral stones are seen.  No free fluid is identified. The small bowel is unremarkable in appearance. The stomach is within normal limits. No acute vascular abnormalities are seen. Scattered calcification is noted along the distal abdominal aorta and its branches.  A moderate umbilical hernia is seen, containing only fat.  There appears to be a localized perforation at the right mid sigmoid colon, with scattered soft tissue air tracking into the surrounding fat, and extensive surrounding soft tissue inflammation and trace fluid. A small amount of peripherally enhancing fluid is  noted, measuring up to 1.5 cm, without a definite drainable abscess. Diffuse soft tissue edema extends superiorly along the mesentery, and into the pelvis, with inflammation tracking about the decompressed bladder. Soft tissue inflammation extends about adjacent small bowel loops.  This reflects acute diverticulitis at the mid sigmoid colon, with associated colonic wall thickening. Underlying diverticulosis involves the distal descending and sigmoid colon.  The appendix is normal in caliber, partially obscured by surrounding soft tissue inflammation.  The bladder is decompressed and not well assessed. The  prostate is mildly enlarged, measuring 4.9 cm in transverse dimension. No inguinal lymphadenopathy is seen.  No acute osseous abnormalities are identified.  IMPRESSION: 1. Localized perforation at the right mid sigmoid colon, with scattered soft tissue air tracking into the surrounding fat, and extensive surrounding soft tissue inflammation and trace fluid. Small peripherally enhancing fluid collection measuring 1.5 cm noted, without a definite drainable abscess. Diffuse soft tissue edema extends superiorly along the mesentery, and into the pelvis, with inflammation tracking about the decompressed bladder. Soft tissue inflammation tracks about adjacent small bowel loops. This reflects acute diverticulitis at the mid sigmoid colon, with associated colonic wall thickening. 2. Underlying diverticulosis involves the distal descending and sigmoid colon. 3. Mildly enlarged prostate. 4. Right renal cyst noted. 5. Scattered coronary artery calcifications noted. 6. Moderate umbilical hernia, containing only fat. 7. Scattered calcification along the distal abdominal aorta and its branches. Critical Value/emergent results were called by telephone at the time of interpretation on 06/13/2015 at 9:49 pm to Sharilyn Sites PA, who verbally acknowledged these results.   Electronically Signed   By: Roanna Raider M.D.   On: 06/13/2015 21:57    Anti-infectives: Anti-infectives    Start     Dose/Rate Route Frequency Ordered Stop   06/14/15 1100  ciprofloxacin (CIPRO) IVPB 400 mg     400 mg 200 mL/hr over 60 Minutes Intravenous Every 12 hours 06/13/15 2358     06/14/15 0600  metroNIDAZOLE (FLAGYL) IVPB 500 mg     500 mg 100 mL/hr over 60 Minutes Intravenous Every 8 hours 06/13/15 2359     06/13/15 2315  ciprofloxacin (CIPRO) IVPB 400 mg  Status:  Discontinued     400 mg 200 mL/hr over 60 Minutes Intravenous Every 12 hours 06/13/15 2303 06/13/15 2358   06/13/15 2315  metroNIDAZOLE (FLAGYL) IVPB 500 mg  Status:  Discontinued      500 mg 100 mL/hr over 60 Minutes Intravenous Every 8 hours 06/13/15 2303 06/13/15 2359   06/13/15 2145  ciprofloxacin (CIPRO) IVPB 400 mg     400 mg 200 mL/hr over 60 Minutes Intravenous  Once 06/13/15 2144 06/14/15 0024   06/13/15 2145  metroNIDAZOLE (FLAGYL) IVPB 500 mg     500 mg 100 mL/hr over 60 Minutes Intravenous  Once 06/13/15 2144 06/13/15 2333      Assessment/Plan 1. Diverticulitis with microperforation and small abscess -abscess is not amenable to drainage -WBC down to 13K and patient without any pain -will try clear liquids today and see how he does -recheck CBC in am -Cipro/Flagyl D3 -cont conservative management    LOS: 2 days    Nathan Miller E 06/15/2015, 11:16 AM Pager: [phone]

## 2015-06-16 DIAGNOSIS — E1165 Type 2 diabetes mellitus with hyperglycemia: Secondary | ICD-10-CM

## 2015-06-16 DIAGNOSIS — I2510 Atherosclerotic heart disease of native coronary artery without angina pectoris: Secondary | ICD-10-CM

## 2015-06-16 DIAGNOSIS — Z9861 Coronary angioplasty status: Secondary | ICD-10-CM

## 2015-06-16 DIAGNOSIS — K5780 Diverticulitis of intestine, part unspecified, with perforation and abscess without bleeding: Secondary | ICD-10-CM

## 2015-06-16 LAB — CBC
HCT: 32.4 % — ABNORMAL LOW (ref 39.0–52.0)
Hemoglobin: 10.8 g/dL — ABNORMAL LOW (ref 13.0–17.0)
MCH: 28.1 pg (ref 26.0–34.0)
MCHC: 33.3 g/dL (ref 30.0–36.0)
MCV: 84.2 fL (ref 78.0–100.0)
Platelets: 195 10*3/uL (ref 150–400)
RBC: 3.85 MIL/uL — ABNORMAL LOW (ref 4.22–5.81)
RDW: 14.5 % (ref 11.5–15.5)
WBC: 9 10*3/uL (ref 4.0–10.5)

## 2015-06-16 LAB — GLUCOSE, CAPILLARY
Glucose-Capillary: 98 mg/dL (ref 65–99)
Glucose-Capillary: 98 mg/dL (ref 65–99)
Glucose-Capillary: 107 mg/dL — ABNORMAL HIGH (ref 65–99)
Glucose-Capillary: 99 mg/dL (ref 65–99)

## 2015-06-16 LAB — BASIC METABOLIC PANEL
Anion gap: 8 (ref 5–15)
BUN: 8 mg/dL (ref 6–20)
CO2: 24 mmol/L (ref 22–32)
Calcium: 7.8 mg/dL — ABNORMAL LOW (ref 8.9–10.3)
Chloride: 98 mmol/L — ABNORMAL LOW (ref 101–111)
Creatinine, Ser: 0.79 mg/dL (ref 0.61–1.24)
GFR calc Af Amer: >60 mL/min (ref >60)
GFR calc non Af Amer: >60 mL/min (ref >60)
Glucose, Bld: 100 mg/dL — ABNORMAL HIGH (ref 65–99)
Potassium: 2.8 mmol/L — ABNORMAL LOW (ref 3.5–5.1)
Sodium: 130 mmol/L — ABNORMAL LOW (ref 135–145)

## 2015-06-16 MED ORDER — ZOLPIDEM TARTRATE 5 MG PO TABS
5.0000 mg | ORAL_TABLET | Freq: Once | ORAL | Status: AC
  Administered 2015-06-17: 5 mg via ORAL
  Filled 2015-06-16: qty 1

## 2015-06-16 MED ORDER — POTASSIUM CHLORIDE 10 MEQ/100ML IV SOLN
10.0000 meq | INTRAVENOUS | Status: DC
  Administered 2015-06-16 (×3): 10 meq via INTRAVENOUS
  Filled 2015-06-16 (×4): qty 100

## 2015-06-16 MED ORDER — MAGNESIUM SULFATE 2 GM/50ML IV SOLN
2.0000 g | Freq: Once | INTRAVENOUS | Status: AC
  Administered 2015-06-16: 2 g via INTRAVENOUS
  Filled 2015-06-16: qty 50

## 2015-06-16 MED ORDER — ATORVASTATIN CALCIUM 80 MG PO TABS
80.0000 mg | ORAL_TABLET | Freq: Every day | ORAL | Status: DC
  Administered 2015-06-16 – 2015-06-17 (×2): 80 mg via ORAL
  Filled 2015-06-16 (×4): qty 1

## 2015-06-16 MED ORDER — CARVEDILOL 6.25 MG PO TABS: 6.2500 mg | ORAL_TABLET | Freq: Two times a day (BID) | ORAL | Status: DC

## 2015-06-16 MED ORDER — POTASSIUM CHLORIDE 10 MEQ/100ML IV SOLN: 10.0000 meq | INTRAVENOUS | Status: DC

## 2015-06-16 MED ORDER — LISINOPRIL 10 MG PO TABS
10.0000 mg | ORAL_TABLET | Freq: Every day | ORAL | Status: DC
  Administered 2015-06-16 – 2015-06-18 (×3): 10 mg via ORAL
  Filled 2015-06-16 (×5): qty 1

## 2015-06-16 MED ORDER — CARVEDILOL 6.25 MG PO TABS
6.2500 mg | ORAL_TABLET | Freq: Two times a day (BID) | ORAL | Status: DC
  Administered 2015-06-16 – 2015-06-18 (×4): 6.25 mg via ORAL
  Filled 2015-06-16 (×7): qty 1

## 2015-06-16 MED ORDER — ASPIRIN EC 81 MG PO TBEC
81.0000 mg | DELAYED_RELEASE_TABLET | Freq: Every day | ORAL | Status: DC
  Administered 2015-06-16 – 2015-06-18 (×3): 81 mg via ORAL
  Filled 2015-06-16 (×5): qty 1

## 2015-06-16 NOTE — Progress Notes (Deleted)
[facility] PROGRESS NOTE  Nathan Miller SEG:[id number] DOB: 02-19-1951 DOA: 06/13/2015 PCP: Rose Fillers, PA-C  HPI/Subjective: Feels well and tolerating clear liquid diet. Denies abdominal pain. No nausea, vomiting or fever.  Assessment/Plan: Principle Problem: 1. Diverticulitis of intestine with perforation. Feeling better. No fever. WBC normalized. Diet advanced to full liquid. General surgery following.  Acute Problems: 2. HTN - elevated systolic in 150's. Lisinopril and coreg are being held due to bradycardia. Monitor closely.   3. Hx of complete heart block: status post pacemaker - monitoring on telemetry. Continue to hold beta blocker, HR is stable in the 50's. Recheck EKG 12 lead.   4. Hypokalemia - K dropped to 2.8 today. Mag 1.7 yesterday. Replete K and Mag and check in am.   5. Anemia - H&H dropped slightly to 10.8 and 32.4. Continue to monitor.  6. Diabetes mellitus type 2: on insulin therapy - CBG's stable. Using SSI, on 5 units of lantus, follow and adjust as needed.  7. CAD: s/p PCI - on ASA and statins    Code Status: Full code DVT Prophylaxis: Heparin Family Communication: no family at bedside Disposition Plan: stay inpatient   Consultants:  General surgery  Procedures:  None  Antibiotics:  mentronidazole IVPB 500 mg, 8/15-  Mupirocin ointment 2% for 5 days, 8/15   Objective: Filed Vitals:   06/16/15 0539  BP: 156/66  Pulse: 50  Temp: 99.1 F (37.3 C)  Resp: 18    Intake/Output Summary (Last 24 hours) at 06/16/15 0939 Last data filed at 06/16/15 0600  Gross per 24 hour  Intake   2600 ml  Output    450 ml  Net   2150 ml   Filed Weights   06/14/15 1525 06/15/15 0626 06/16/15 0539  Weight: 96.026 kg (211 lb 11.2 oz) 95.573 kg (210 lb 11.2 oz) 96.208 kg (212 lb 1.6 oz)    Exam:   General: Pleasant, well-appearing 64 y.o. male resting in chair, in no acute distress.  Cardiovascular: Bradycardic, no rubs, no gallops, no  JVD  Respiratory: Clear to auscultation b/l  Abdomen: Soft, no guarding, no TTP in any quadrant, + bowel sounds  Extremities: No edema or cyanosis. Right foot has chronic open wound, no drainage.  Data Reviewed: Basic Metabolic Panel:  Recent Labs Lab 06/13/15 1615 06/13/15 2325 06/14/15 0225 06/15/15 0347 06/16/15 0430  NA 131* 130* 133* 132* 130*  K 3.7 3.1* 3.3* 3.9 2.8*  CL 92* 94* 100* 100* 98*  CO2 29 24 25 25 24   GLUCOSE 134* 117* 101* 89 100*  BUN 13 17 17 9 8   CREATININE 0.92 1.04 1.04 0.89 0.79  CALCIUM 9.1 8.4* 7.9* 8.2* 7.8*  MG  --   --   --  1.7  --    Liver Function Tests:  Recent Labs Lab 06/13/15 1615 06/13/15 2325  AST 18 16  ALT 13* 11*  ALKPHOS 96 75  BILITOT 3.0* 3.2*  PROT 7.3 6.6  ALBUMIN 3.4* 3.0*    Recent Labs Lab 06/13/15 1615  LIPASE 13*   CBC:  Recent Labs Lab 06/13/15 1615 06/14/15 0225 06/15/15 0347 06/16/15 0430  WBC 15.0* 19.7* 13.5* 9.0  HGB 14.5 11.7* 11.3* 10.8*  HCT 42.8 35.6* 34.1* 32.4*  MCV 85.3 87.0 85.7 84.2  PLT 249 182 157 195   CBG:  Recent Labs Lab 06/15/15 0622 06/15/15 1210 06/15/15 1628 06/15/15 2059 06/16/15 0542  GLUCAP 79 79 100* 102* 98    Recent Results (from the  past 240 hour(s))  Urine culture     Status: None   Collection Time: 06/13/15  9:00 PM  Result Value Ref Range Status   Specimen Description URINE, RANDOM  Final   Special Requests ADDED 0247 06/14/15  Final   Culture MULTIPLE SPECIES PRESENT, SUGGEST RECOLLECTION  Final   Report Status 06/15/2015 FINAL  Final  Culture, blood (routine x 2)     Status: None (Preliminary result)   Collection Time: 06/13/15 10:10 PM  Result Value Ref Range Status   Specimen Description BLOOD RIGHT FOREARM  Final   Special Requests BOTTLES DRAWN AEROBIC AND ANAEROBIC 5CC  Final   Culture NO GROWTH 2 DAYS  Final   Report Status PENDING  Incomplete  Culture, blood (routine x 2)     Status: None (Preliminary result)   Collection Time:  06/13/15 10:24 PM  Result Value Ref Range Status   Specimen Description BLOOD LEFT HAND  Final   Special Requests BOTTLES DRAWN AEROBIC AND ANAEROBIC 5CC  Final   Culture NO GROWTH 2 DAYS  Final   Report Status PENDING  Incomplete  MRSA PCR Screening     Status: Abnormal   Collection Time: 06/14/15 12:01 AM  Result Value Ref Range Status   MRSA by PCR POSITIVE (A) NEGATIVE Final    Comment:        The GeneXpert MRSA Assay (FDA approved for NASAL specimens only), is one component of a comprehensive MRSA colonization surveillance program. It is not intended to diagnose MRSA infection nor to guide or monitor treatment for MRSA infections. RESULT CALLED TO, READ BACK BY AND VERIFIED WITH: [facility] RN [id number] 06/14/15 MITCHELL,L      Studies: No results found.  Scheduled Meds: . antiseptic oral rinse  7 mL Mouth Rinse BID  . Chlorhexidine Gluconate Cloth  6 each Topical Q0600  . ciprofloxacin  400 mg Intravenous Q12H  . heparin  5,000 Units Subcutaneous 3 times per day  . insulin aspart  0-9 Units Subcutaneous TID WC  . insulin glargine  5 Units Subcutaneous QHS  . magnesium sulfate 1 - 4 g bolus IVPB  2 g Intravenous Once  . metronidazole  500 mg Intravenous Q8H  . mupirocin ointment  1 application Nasal BID  . pantoprazole (PROTONIX) IV  40 mg Intravenous Q12H  . potassium chloride  10 mEq Intravenous Q1 Hr x 4  . sodium chloride  3 mL Intravenous Q12H   Continuous Infusions: . sodium chloride 75 mL/hr at 06/14/15 2105    Principal Problem:   Diverticulitis of intestine with perforation Active Problems:   CAD S/P percutaneous coronary angioplasty; bms X 2 to RCA 2001   Bradycardia   Complete heart block   Diabetes mellitus type 2, uncontrolled   Diverticulitis   Diverticulitis of intestine with perforation without bleeding   Blood poisoning    Time spent: 25 minutes   [name] L. Algis Greenhouse, PA-S  Jeoffrey Massed, MD  [facility] Pager [phone]. If 7PM-7AM,  please contact night-coverage at [email or web address], password [email or web address] 06/16/2015, 9:39 AM  LOS: 3 days

## 2015-06-16 NOTE — Progress Notes (Signed)
Patient ID: Nathan Miller, male   DOB: 1951-04-13, 63 y.o.   MRN: [id number]    Subjective: Pt feels ok today.  Not really having any pain.  Tolerating clear liquids  Objective: Vital signs in last 24 hours: Temp:  [97.9 F (36.6 C)-99.7 F (37.6 C)] 99.1 F (37.3 C) (08/17 0539) Pulse Rate:  [50-52] 50 (08/17 0539) Resp:  [18] 18 (08/17 0539) BP: (129-156)/(52-66) 156/66 mmHg (08/17 0539) SpO2:  [95 %-99 %] 95 % (08/17 0539) Weight:  [96.208 kg (212 lb 1.6 oz)] 96.208 kg (212 lb 1.6 oz) (08/17 0539) Last BM Date: 06/14/15  Intake/Output from previous day: 08/16 0701 - 08/17 0700 In: 2600 [P.O.:1500; I.V.:900; IV Piggyback:200] Out: 750 [Urine:750] Intake/Output this shift:    PE: Abd: soft, Nt, ND, +BS  Lab Results:   Recent Labs  06/15/15 0347 06/16/15 0430  WBC 13.5* 9.0  HGB 11.3* 10.8*  HCT 34.1* 32.4*  PLT 157 195   BMET  Recent Labs  06/15/15 0347 06/16/15 0430  NA 132* 130*  K 3.9 2.8*  CL 100* 98*  CO2 25 24  GLUCOSE 89 100*  BUN 9 8  CREATININE 0.89 0.79  CALCIUM 8.2* 7.8*   PT/INR  Recent Labs  06/13/15 2325  LABPROT 17.1*  INR 1.39   CMP     Component Value Date/Time   NA 130* 06/16/2015 0430   K 2.8* 06/16/2015 0430   CL 98* 06/16/2015 0430   CO2 24 06/16/2015 0430   GLUCOSE 100* 06/16/2015 0430   BUN 8 06/16/2015 0430   CREATININE 0.79 06/16/2015 0430   CREATININE 0.71 03/20/2011 1014   CALCIUM 7.8* 06/16/2015 0430   PROT 6.6 06/13/2015 2325   ALBUMIN 3.0* 06/13/2015 2325   AST 16 06/13/2015 2325   ALT 11* 06/13/2015 2325   ALKPHOS 75 06/13/2015 2325   BILITOT 3.2* 06/13/2015 2325   GFRNONAA >60 06/16/2015 0430   GFRAA >60 06/16/2015 0430   Lipase     Component Value Date/Time   LIPASE 13* 06/13/2015 1615       Studies/Results: No results found.  Anti-infectives: Anti-infectives    Start     Dose/Rate Route Frequency Ordered Stop   06/14/15 1100  ciprofloxacin (CIPRO) IVPB 400 mg     400 mg 200 mL/hr over  60 Minutes Intravenous Every 12 hours 06/13/15 2358     06/14/15 0600  metroNIDAZOLE (FLAGYL) IVPB 500 mg     500 mg 100 mL/hr over 60 Minutes Intravenous Every 8 hours 06/13/15 2359     06/13/15 2315  ciprofloxacin (CIPRO) IVPB 400 mg  Status:  Discontinued     400 mg 200 mL/hr over 60 Minutes Intravenous Every 12 hours 06/13/15 2303 06/13/15 2358   06/13/15 2315  metroNIDAZOLE (FLAGYL) IVPB 500 mg  Status:  Discontinued     500 mg 100 mL/hr over 60 Minutes Intravenous Every 8 hours 06/13/15 2303 06/13/15 2359   06/13/15 2145  ciprofloxacin (CIPRO) IVPB 400 mg     400 mg 200 mL/hr over 60 Minutes Intravenous  Once 06/13/15 2144 06/14/15 0024   06/13/15 2145  metroNIDAZOLE (FLAGYL) IVPB 500 mg     500 mg 100 mL/hr over 60 Minutes Intravenous  Once 06/13/15 2144 06/13/15 2333       Assessment/Plan  1. Diverticulitis with microperforation and small abscess -abscess is not amenable to drainage -WBC down to 9K and patient without any pain -will advance to full liquids -Cipro/Flagyl D4 -cont conservative management  LOS: 3 days    Linsy Ehresman E 06/16/2015, 9:57 AM Pager: [phone]

## 2015-06-16 NOTE — Progress Notes (Signed)
PATIENT DETAILS Name: Nathan Miller Age: 64 y.o. Sex: male Date of Birth: October 29, 1951 Admit Date: 06/13/2015 Admitting Physician Alba Cory, MD [id number], [place], PA-C  Subjective: Significantly less abd pain   Assessment/Plan: Principal Problem:  Diverticulitis of intestine with perforation: Managed with conservative measures- Continue empiric antibiotics. General surgery following, diet slowly being advanced.  Active Problems: Hypokalemia: Likely secondary to poor oral intake/hypomagnesemia-replete and recheck.  Essential hypertension: Antihypertensives initially held as nothing by mouth-blood pressure now creeping up-to resume both lisinopril and Coreg and Follow  CAD S/P percutaneous coronary angioplasty; bms X 2 to RCA 2001: Resume aspirin, statin and beta blocker as tolerating oral intake  Type 2 diabetes: Continue to hold metformin-CBGs currently stable with Lantus 5 units and SSI-follow  Disposition: Remain inpatient  Antimicrobial agents  See below  Anti-infectives    Start     Dose/Rate Route Frequency Ordered Stop   06/14/15 1100  ciprofloxacin (CIPRO) IVPB 400 mg     400 mg 200 mL/hr over 60 Minutes Intravenous Every 12 hours 06/13/15 2358     06/14/15 0600  metroNIDAZOLE (FLAGYL) IVPB 500 mg     500 mg 100 mL/hr over 60 Minutes Intravenous Every 8 hours 06/13/15 2359     06/13/15 2315  ciprofloxacin (CIPRO) IVPB 400 mg  Status:  Discontinued     400 mg 200 mL/hr over 60 Minutes Intravenous Every 12 hours 06/13/15 2303 06/13/15 2358   06/13/15 2315  metroNIDAZOLE (FLAGYL) IVPB 500 mg  Status:  Discontinued     500 mg 100 mL/hr over 60 Minutes Intravenous Every 8 hours 06/13/15 2303 06/13/15 2359   06/13/15 2145  ciprofloxacin (CIPRO) IVPB 400 mg     400 mg 200 mL/hr over 60 Minutes Intravenous  Once 06/13/15 2144 06/14/15 0024   06/13/15 2145  metroNIDAZOLE (FLAGYL) IVPB 500 mg     500 mg 100 mL/hr over 60 Minutes  Intravenous  Once 06/13/15 2144 06/13/15 2333      DVT Prophylaxis: Prophylactic Heparin   Code Status: Full code   Family Communication None at bedside  Procedures: None  CONSULTS:  None  Time spent 30 minutes-Greater than 50% of this time was spent in counseling, explanation of diagnosis, planning of further management, and coordination of care.  MEDICATIONS: Scheduled Meds: . antiseptic oral rinse  7 mL Mouth Rinse BID  . Chlorhexidine Gluconate Cloth  6 each Topical Q0600  . ciprofloxacin  400 mg Intravenous Q12H  . heparin  5,000 Units Subcutaneous 3 times per day  . insulin aspart  0-9 Units Subcutaneous TID WC  . insulin glargine  5 Units Subcutaneous QHS  . metronidazole  500 mg Intravenous Q8H  . mupirocin ointment  1 application Nasal BID  . pantoprazole (PROTONIX) IV  40 mg Intravenous Q12H  . potassium chloride  10 mEq Intravenous Q1 Hr x 4  . sodium chloride  3 mL Intravenous Q12H   Continuous Infusions: . sodium chloride 1,000 mL (06/16/15 0950)   PRN Meds:.acetaminophen **OR** acetaminophen, morphine injection, ondansetron **OR** ondansetron (ZOFRAN) IV    PHYSICAL EXAM: Vital signs in last 24 hours: Filed Vitals:   06/15/15 1341 06/15/15 2057 06/16/15 0539 06/16/15 1353  BP: 152/58 129/52 156/66 162/72  Pulse: 50 50 50 52  Temp: 97.9 F (36.6 C) 99.7 F (37.6 C) 99.1 F (37.3 C) 98.6 F (37 C)  TempSrc: Oral Oral Oral Oral  Resp: 18  18 18   Height:      Weight:   96.208 kg (212 lb 1.6 oz)   SpO2: 99% 97% 95% 98%    Weight change: 0.181 kg (6.4 oz) Filed Weights   06/14/15 1525 06/15/15 0626 06/16/15 0539  Weight: 96.026 kg (211 lb 11.2 oz) 95.573 kg (210 lb 11.2 oz) 96.208 kg (212 lb 1.6 oz)   Body mass index is 34.25 kg/(m^2).   Gen Exam: Awake and alert with clear speech.   Neck: Supple, No JVD.   Chest: B/L Clear.   CVS: S1 S2 Regular, no murmurs.  Abdomen: soft, BS +, minimally tender in lower abd, non distended.    Extremities: no edema, lower extremities warm to touch. Neurologic: Non Focal.   Skin: No Rash.   Wounds: N/A.   Intake/Output from previous day:  Intake/Output Summary (Last 24 hours) at 06/16/15 1445 Last data filed at 06/16/15 1147  Gross per 24 hour  Intake   1260 ml  Output    200 ml  Net   1060 ml     LAB RESULTS: CBC  Recent Labs Lab 06/13/15 1615 06/14/15 0225 06/15/15 0347 06/16/15 0430  WBC 15.0* 19.7* 13.5* 9.0  HGB 14.5 11.7* 11.3* 10.8*  HCT 42.8 35.6* 34.1* 32.4*  PLT 249 182 157 195  MCV 85.3 87.0 85.7 84.2  MCH 28.9 28.6 28.4 28.1  MCHC 33.9 32.9 33.1 33.3  RDW 14.5 14.6 14.6 14.5    Chemistries   Recent Labs Lab 06/13/15 1615 06/13/15 2325 06/14/15 0225 06/15/15 0347 06/16/15 0430  NA 131* 130* 133* 132* 130*  K 3.7 3.1* 3.3* 3.9 2.8*  CL 92* 94* 100* 100* 98*  CO2 29 24 25 25 24   GLUCOSE 134* 117* 101* 89 100*  BUN 13 17 17 9 8   CREATININE 0.92 1.04 1.04 0.89 0.79  CALCIUM 9.1 8.4* 7.9* 8.2* 7.8*  MG  --   --   --  1.7  --     CBG:  Recent Labs Lab 06/15/15 1210 06/15/15 1628 06/15/15 2059 06/16/15 0542 06/16/15 1139  GLUCAP 79 100* 102* 98 98    GFR Estimated Creatinine Clearance: 105.3 mL/min (by C-G formula based on Cr of 0.79).  Coagulation profile  Recent Labs Lab 06/13/15 2325  INR 1.39    Cardiac Enzymes No results for input(s): CKMB, TROPONINI, MYOGLOBIN in the last 168 hours.  Invalid input(s): CK  Invalid input(s): POCBNP No results for input(s): DDIMER in the last 72 hours. No results for input(s): HGBA1C in the last 72 hours. No results for input(s): CHOL, HDL, LDLCALC, TRIG, CHOLHDL, LDLDIRECT in the last 72 hours. No results for input(s): TSH, T4TOTAL, T3FREE, THYROIDAB in the last 72 hours.  Invalid input(s): FREET3 No results for input(s): VITAMINB12, FOLATE, FERRITIN, TIBC, IRON, RETICCTPCT in the last 72 hours.  Recent Labs  06/13/15 1615  LIPASE 13*    Urine Studies No results for  input(s): UHGB, CRYS in the last 72 hours.  Invalid input(s): UACOL, UAPR, USPG, UPH, UTP, UGL, UKET, UBIL, UNIT, UROB, ULEU, UEPI, UWBC, URBC, UBAC, CAST, UCOM, BILUA  MICROBIOLOGY: Recent Results (from the past 240 hour(s))  Urine culture     Status: None   Collection Time: 06/13/15  9:00 PM  Result Value Ref Range Status   Specimen Description URINE, RANDOM  Final   Special Requests ADDED 0247 06/14/15  Final   Culture MULTIPLE SPECIES PRESENT, SUGGEST RECOLLECTION  Final   Report Status 06/15/2015 FINAL  Final  Culture, blood (routine x 2)     Status: None (Preliminary result)   Collection Time: 06/13/15 10:10 PM  Result Value Ref Range Status   Specimen Description BLOOD RIGHT FOREARM  Final   Special Requests BOTTLES DRAWN AEROBIC AND ANAEROBIC 5CC  Final   Culture NO GROWTH 3 DAYS  Final   Report Status PENDING  Incomplete  Culture, blood (routine x 2)     Status: None (Preliminary result)   Collection Time: 06/13/15 10:24 PM  Result Value Ref Range Status   Specimen Description BLOOD LEFT HAND  Final   Special Requests BOTTLES DRAWN AEROBIC AND ANAEROBIC 5CC  Final   Culture NO GROWTH 3 DAYS  Final   Report Status PENDING  Incomplete  MRSA PCR Screening     Status: Abnormal   Collection Time: 06/14/15 12:01 AM  Result Value Ref Range Status   MRSA by PCR POSITIVE (A) NEGATIVE Final    Comment:        The GeneXpert MRSA Assay (FDA approved for NASAL specimens only), is one component of a comprehensive MRSA colonization surveillance program. It is not intended to diagnose MRSA infection nor to guide or monitor treatment for MRSA infections. RESULT CALLED TO, READ BACK BY AND VERIFIED WITH: [facility] RN [id number] 06/14/15 MITCHELL,L     RADIOLOGY STUDIES/RESULTS: Ct Abdomen Pelvis W Contrast  06/13/2015   CLINICAL DATA:  Acute onset of generalized abdominal pain and vomiting. Fever. Initial encounter.  EXAM: CT ABDOMEN AND PELVIS WITH CONTRAST  TECHNIQUE: Multidetector CT  imaging of the abdomen and pelvis was performed using the standard protocol following bolus administration of intravenous contrast.  CONTRAST:  OMNIPAQUE IOHEXOL 300 MG/ML  SOLN  COMPARISON:  None.  FINDINGS: The visualized lung bases are clear. Scattered coronary artery calcifications are seen. A pacemaker lead is partially imaged.  The liver and spleen are unremarkable in appearance. The gallbladder is within normal limits. The pancreas and adrenal glands are unremarkable.  Nonspecific perinephric stranding is noted bilaterally. Right renal cysts measure up to 2.4 cm in size. There is no evidence of hydronephrosis. No renal or ureteral stones are seen.  No free fluid is identified. The small bowel is unremarkable in appearance. The stomach is within normal limits. No acute vascular abnormalities are seen. Scattered calcification is noted along the distal abdominal aorta and its branches.  A moderate umbilical hernia is seen, containing only fat.  There appears to be a localized perforation at the right mid sigmoid colon, with scattered soft tissue air tracking into the surrounding fat, and extensive surrounding soft tissue inflammation and trace fluid. A small amount of peripherally enhancing fluid is noted, measuring up to 1.5 cm, without a definite drainable abscess. Diffuse soft tissue edema extends superiorly along the mesentery, and into the pelvis, with inflammation tracking about the decompressed bladder. Soft tissue inflammation extends about adjacent small bowel loops.  This reflects acute diverticulitis at the mid sigmoid colon, with associated colonic wall thickening. Underlying diverticulosis involves the distal descending and sigmoid colon.  The appendix is normal in caliber, partially obscured by surrounding soft tissue inflammation.  The bladder is decompressed and not well assessed. The prostate is mildly enlarged, measuring 4.9 cm in transverse dimension. No inguinal lymphadenopathy is seen.   No acute osseous abnormalities are identified.  IMPRESSION: 1. Localized perforation at the right mid sigmoid colon, with scattered soft tissue air tracking into the surrounding fat, and extensive surrounding soft tissue inflammation and trace fluid. Small peripherally enhancing fluid  collection measuring 1.5 cm noted, without a definite drainable abscess. Diffuse soft tissue edema extends superiorly along the mesentery, and into the pelvis, with inflammation tracking about the decompressed bladder. Soft tissue inflammation tracks about adjacent small bowel loops. This reflects acute diverticulitis at the mid sigmoid colon, with associated colonic wall thickening. 2. Underlying diverticulosis involves the distal descending and sigmoid colon. 3. Mildly enlarged prostate. 4. Right renal cyst noted. 5. Scattered coronary artery calcifications noted. 6. Moderate umbilical hernia, containing only fat. 7. Scattered calcification along the distal abdominal aorta and its branches. Critical Value/emergent results were called by telephone at the time of interpretation on 06/13/2015 at 9:49 pm to Sharilyn Sites PA, who verbally acknowledged these results.   Electronically Signed   By: Roanna Raider M.D.   On: 06/13/2015 21:57    Jeoffrey Massed, MD  [facility] Pager:[phone]  If 7PM-7AM, please contact night-coverage [email or web address] Password [email or web address] 06/16/2015, 2:45 PM   LOS: 3 days

## 2015-06-17 ENCOUNTER — Other Ambulatory Visit: Payer: Self-pay | Admitting: Surgery

## 2015-06-17 DIAGNOSIS — K5732 Diverticulitis of large intestine without perforation or abscess without bleeding: Secondary | ICD-10-CM

## 2015-06-17 DIAGNOSIS — K5720 Diverticulitis of large intestine with perforation and abscess without bleeding: Secondary | ICD-10-CM

## 2015-06-17 LAB — BASIC METABOLIC PANEL
Anion gap: 5 (ref 5–15)
Anion gap: 5 (ref 5–15)
BUN: <5 mg/dL — ABNORMAL LOW (ref 6–20)
BUN: <5 mg/dL — ABNORMAL LOW (ref 6–20)
CO2: 29 mmol/L (ref 22–32)
CO2: 33 mmol/L — ABNORMAL HIGH (ref 22–32)
Calcium: 8 mg/dL — ABNORMAL LOW (ref 8.9–10.3)
Calcium: 8.4 mg/dL — ABNORMAL LOW (ref 8.9–10.3)
Chloride: 102 mmol/L (ref 101–111)
Chloride: 99 mmol/L — ABNORMAL LOW (ref 101–111)
Creatinine, Ser: 0.74 mg/dL (ref 0.61–1.24)
Creatinine, Ser: 0.84 mg/dL (ref 0.61–1.24)
GFR calc Af Amer: >60 mL/min (ref >60)
GFR calc Af Amer: >60 mL/min (ref >60)
GFR calc non Af Amer: >60 mL/min (ref >60)
GFR calc non Af Amer: >60 mL/min (ref >60)
Glucose, Bld: 109 mg/dL — ABNORMAL HIGH (ref 65–99)
Glucose, Bld: 148 mg/dL — ABNORMAL HIGH (ref 65–99)
Potassium: 2.9 mmol/L — ABNORMAL LOW (ref 3.5–5.1)
Potassium: 2.8 mmol/L — ABNORMAL LOW (ref 3.5–5.1)
Sodium: 136 mmol/L (ref 135–145)
Sodium: 137 mmol/L (ref 135–145)

## 2015-06-17 LAB — MAGNESIUM: Magnesium: 1.8 mg/dL (ref 1.7–2.4)

## 2015-06-17 LAB — GLUCOSE, CAPILLARY
Glucose-Capillary: 94 mg/dL (ref 65–99)
Glucose-Capillary: 89 mg/dL (ref 65–99)
Glucose-Capillary: 105 mg/dL — ABNORMAL HIGH (ref 65–99)
Glucose-Capillary: 138 mg/dL — ABNORMAL HIGH (ref 65–99)

## 2015-06-17 MED ORDER — CIPROFLOXACIN HCL 500 MG PO TABS
500.0000 mg | ORAL_TABLET | Freq: Two times a day (BID) | ORAL | Status: DC
  Administered 2015-06-17 – 2015-06-18 (×3): 500 mg via ORAL
  Filled 2015-06-17 (×4): qty 1

## 2015-06-17 MED ORDER — POTASSIUM CHLORIDE CRYS ER 20 MEQ PO TBCR
40.0000 meq | EXTENDED_RELEASE_TABLET | Freq: Once | ORAL | Status: AC
  Administered 2015-06-17: 40 meq via ORAL
  Filled 2015-06-17: qty 4

## 2015-06-17 MED ORDER — POTASSIUM CHLORIDE 10 MEQ/100ML IV SOLN: 10.0000 meq | INTRAVENOUS | Status: DC

## 2015-06-17 MED ORDER — POTASSIUM CHLORIDE 10 MEQ/100ML IV SOLN
10.0000 meq | INTRAVENOUS | Status: DC
  Administered 2015-06-17: 10 meq via INTRAVENOUS
  Filled 2015-06-17 (×3): qty 100

## 2015-06-17 MED ORDER — POTASSIUM CHLORIDE CRYS ER 20 MEQ PO TBCR
40.0000 meq | EXTENDED_RELEASE_TABLET | ORAL | Status: AC
Start: 2015-06-17 — End: 2015-06-18
  Administered 2015-06-17 – 2015-06-18 (×3): 40 meq via ORAL
  Filled 2015-06-17 (×3): qty 2

## 2015-06-17 MED ORDER — METRONIDAZOLE 500 MG PO TABS
500.0000 mg | ORAL_TABLET | Freq: Three times a day (TID) | ORAL | Status: DC
  Administered 2015-06-17 – 2015-06-18 (×3): 500 mg via ORAL
  Filled 2015-06-17 (×6): qty 1

## 2015-06-17 MED ORDER — POTASSIUM CHLORIDE ER 20 MEQ PO TBCR: 20.0000 meq | EXTENDED_RELEASE_TABLET | Freq: Every day | ORAL | Status: DC

## 2015-06-17 MED ORDER — CIPROFLOXACIN HCL 500 MG PO TABS: 500.0000 mg | ORAL_TABLET | Freq: Two times a day (BID) | ORAL | Status: DC

## 2015-06-17 MED ORDER — ZOLPIDEM TARTRATE 5 MG PO TABS
5.0000 mg | ORAL_TABLET | Freq: Every evening | ORAL | Status: DC | PRN
  Administered 2015-06-17: 5 mg via ORAL
  Filled 2015-06-17: qty 1

## 2015-06-17 MED ORDER — METRONIDAZOLE 500 MG PO TABS: 500.0000 mg | ORAL_TABLET | Freq: Three times a day (TID) | ORAL | Status: DC

## 2015-06-17 MED ORDER — PANTOPRAZOLE SODIUM 40 MG PO TBEC
40.0000 mg | DELAYED_RELEASE_TABLET | Freq: Two times a day (BID) | ORAL | Status: DC
  Administered 2015-06-17 – 2015-06-18 (×3): 40 mg via ORAL
  Filled 2015-06-17 (×3): qty 1

## 2015-06-17 NOTE — Progress Notes (Signed)
Pt refused lantus tonight. Pt states he has not been taking lantus and does not want. Pt states he has been watching what he eats.

## 2015-06-17 NOTE — Progress Notes (Signed)
PATIENT DETAILS Name: Nathan Miller Age: 64 y.o. Sex: male Date of Birth: 04/02/1951 Admit Date: 06/13/2015 Admitting Physician Alba Cory, MD [id number], [place] [employer], PA-C  Subjective: No abd pain-tolerated soft diet for lunch  Assessment/Plan: Principal Problem:  Diverticulitis of intestine with perforation: Managed with conservative measures-and empiric antibiotics. General surgery consulted-much improved-diet slowly advanced-has tolerated soft diet, stable for discharge later today  Active Problems: Hypokalemia: Likely secondary to poor oral intake/hypomagnesemia-replete and recheck later today-if stable-home this evening.  Essential hypertension: Antihypertensives initially held as nothing by mouth-blood pressure started creeping up-to resumd  both lisinopril and Coreg. Follow closely in the outpatient setting.   CAD S/P percutaneous coronary angioplasty; bms X 2 to RCA 2001: Continue aspirin, statin and beta blocker  Type 2 diabetes: Resume metformin and Lantus.on discharge. CBG's stable  Disposition: Remain inpatient-home later today  Antimicrobial agents  See below  Anti-infectives    Start     Dose/Rate Route Frequency Ordered Stop   06/17/15 1400  metroNIDAZOLE (FLAGYL) tablet 500 mg     500 mg Oral 3 times per day 06/17/15 1022     06/17/15 1030  ciprofloxacin (CIPRO) tablet 500 mg     500 mg Oral 2 times daily 06/17/15 1022     06/17/15 0000  ciprofloxacin (CIPRO) 500 MG tablet     500 mg Oral 2 times daily 06/17/15 1440     06/17/15 0000  metroNIDAZOLE (FLAGYL) 500 MG tablet     500 mg Oral Every 8 hours 06/17/15 1440     06/14/15 1100  ciprofloxacin (CIPRO) IVPB 400 mg  Status:  Discontinued     400 mg 200 mL/hr over 60 Minutes Intravenous Every 12 hours 06/13/15 2358 06/17/15 1022   06/14/15 0600  metroNIDAZOLE (FLAGYL) IVPB 500 mg  Status:  Discontinued     500 mg 100 mL/hr over 60 Minutes Intravenous Every 8 hours 06/13/15 2359  06/17/15 1022   06/13/15 2315  ciprofloxacin (CIPRO) IVPB 400 mg  Status:  Discontinued     400 mg 200 mL/hr over 60 Minutes Intravenous Every 12 hours 06/13/15 2303 06/13/15 2358   06/13/15 2315  metroNIDAZOLE (FLAGYL) IVPB 500 mg  Status:  Discontinued     500 mg 100 mL/hr over 60 Minutes Intravenous Every 8 hours 06/13/15 2303 06/13/15 2359   06/13/15 2145  ciprofloxacin (CIPRO) IVPB 400 mg     400 mg 200 mL/hr over 60 Minutes Intravenous  Once 06/13/15 2144 06/14/15 0024   06/13/15 2145  metroNIDAZOLE (FLAGYL) IVPB 500 mg     500 mg 100 mL/hr over 60 Minutes Intravenous  Once 06/13/15 2144 06/13/15 2333      DVT Prophylaxis: Prophylactic Heparin   Code Status: Full code   Family Communication None at bedside  Procedures: None  CONSULTS:  None  Time spent 20 minutes-Greater than 50% of this time was spent in counseling, explanation of diagnosis, planning of further management, and coordination of care.  MEDICATIONS: Scheduled Meds: . antiseptic oral rinse  7 mL Mouth Rinse BID  . aspirin EC  81 mg Oral Daily  . atorvastatin  80 mg Oral q1800  . carvedilol  6.25 mg Oral BID WC  . Chlorhexidine Gluconate Cloth  6 each Topical Q0600  . ciprofloxacin  500 mg Oral BID  . heparin  5,000 Units Subcutaneous 3 times per day  . insulin aspart  0-9 Units Subcutaneous TID WC  .  insulin glargine  5 Units Subcutaneous QHS  . lisinopril  10 mg Oral Daily  . metroNIDAZOLE  500 mg Oral 3 times per day  . mupirocin ointment  1 application Nasal BID  . pantoprazole  40 mg Oral BID  . sodium chloride  3 mL Intravenous Q12H   Continuous Infusions:   PRN Meds:.acetaminophen **OR** acetaminophen, morphine injection, ondansetron **OR** ondansetron (ZOFRAN) IV    PHYSICAL EXAM: Vital signs in last 24 hours: Filed Vitals:   06/17/15 0647 06/17/15 0656 06/17/15 0752 06/17/15 1000  BP: 130/60  154/68 150/60  Pulse: 54  62 64  Temp: 98.9 F (37.2 C)   98 F (36.7 C)  TempSrc:  Oral     Resp: 19     Height:      Weight:  94.53 kg (208 lb 6.4 oz)    SpO2: 97%  97% 96%    Weight change: -1.678 kg (-3 lb 11.2 oz) Filed Weights   06/15/15 0626 06/16/15 0539 06/17/15 0656  Weight: 95.573 kg (210 lb 11.2 oz) 96.208 kg (212 lb 1.6 oz) 94.53 kg (208 lb 6.4 oz)   Body mass index is 33.65 kg/(m^2).   Gen Exam: Awake and alert with clear speech.   Neck: Supple, No JVD.   Chest: B/L Clear.   CVS: S1 S2 Regular, no murmurs.  Abdomen: soft, BS +, minimally tender in lower abd, non distended.  Extremities: no edema, lower extremities warm to touch. Neurologic: Non Focal.   Skin: No Rash.   Wounds: N/A.   Intake/Output from previous day:  Intake/Output Summary (Last 24 hours) at 06/17/15 1442 Last data filed at 06/17/15 1315  Gross per 24 hour  Intake   3105 ml  Output    800 ml  Net   2305 ml     LAB RESULTS: CBC  Recent Labs Lab 06/13/15 1615 06/14/15 0225 06/15/15 0347 06/16/15 0430  WBC 15.0* 19.7* 13.5* 9.0  HGB 14.5 11.7* 11.3* 10.8*  HCT 42.8 35.6* 34.1* 32.4*  PLT 249 182 157 195  MCV 85.3 87.0 85.7 84.2  MCH 28.9 28.6 28.4 28.1  MCHC 33.9 32.9 33.1 33.3  RDW 14.5 14.6 14.6 14.5    Chemistries   Recent Labs Lab 06/13/15 2325 06/14/15 0225 06/15/15 0347 06/16/15 0430 06/17/15 0341  NA 130* 133* 132* 130* 136  K 3.1* 3.3* 3.9 2.8* 2.9*  CL 94* 100* 100* 98* 102  CO2 24 25 25 24 29   GLUCOSE 117* 101* 89 100* 109*  BUN 17 17 9 8  <5*  CREATININE 1.04 1.04 0.89 0.79 0.74  CALCIUM 8.4* 7.9* 8.2* 7.8* 8.0*  MG  --   --  1.7  --  1.8    CBG:  Recent Labs Lab 06/16/15 1139 06/16/15 1640 06/16/15 2122 06/17/15 0547 06/17/15 1138  GLUCAP 98 107* 99 94 89    GFR Estimated Creatinine Clearance: 104.4 mL/min (by C-G formula based on Cr of 0.74).  Coagulation profile  Recent Labs Lab 06/13/15 2325  INR 1.39    Cardiac Enzymes No results for input(s): CKMB, TROPONINI, MYOGLOBIN in the last 168 hours.  Invalid  input(s): CK  Invalid input(s): POCBNP No results for input(s): DDIMER in the last 72 hours. No results for input(s): HGBA1C in the last 72 hours. No results for input(s): CHOL, HDL, LDLCALC, TRIG, CHOLHDL, LDLDIRECT in the last 72 hours. No results for input(s): TSH, T4TOTAL, T3FREE, THYROIDAB in the last 72 hours.  Invalid input(s): FREET3 No results for input(s): VITAMINB12,  FOLATE, FERRITIN, TIBC, IRON, RETICCTPCT in the last 72 hours. No results for input(s): LIPASE, AMYLASE in the last 72 hours.  Urine Studies No results for input(s): UHGB, CRYS in the last 72 hours.  Invalid input(s): UACOL, UAPR, USPG, UPH, UTP, UGL, UKET, UBIL, UNIT, UROB, ULEU, UEPI, UWBC, URBC, UBAC, CAST, UCOM, BILUA  MICROBIOLOGY: Recent Results (from the past 240 hour(s))  Urine culture     Status: None   Collection Time: 06/13/15  9:00 PM  Result Value Ref Range Status   Specimen Description URINE, RANDOM  Final   Special Requests ADDED 0247 06/14/15  Final   Culture MULTIPLE SPECIES PRESENT, SUGGEST RECOLLECTION  Final   Report Status 06/15/2015 FINAL  Final  Culture, blood (routine x 2)     Status: None (Preliminary result)   Collection Time: 06/13/15 10:10 PM  Result Value Ref Range Status   Specimen Description BLOOD RIGHT FOREARM  Final   Special Requests BOTTLES DRAWN AEROBIC AND ANAEROBIC 5CC  Final   Culture NO GROWTH 4 DAYS  Final   Report Status PENDING  Incomplete  Culture, blood (routine x 2)     Status: None (Preliminary result)   Collection Time: 06/13/15 10:24 PM  Result Value Ref Range Status   Specimen Description BLOOD LEFT HAND  Final   Special Requests BOTTLES DRAWN AEROBIC AND ANAEROBIC 5CC  Final   Culture NO GROWTH 4 DAYS  Final   Report Status PENDING  Incomplete  MRSA PCR Screening     Status: Abnormal   Collection Time: 06/14/15 12:01 AM  Result Value Ref Range Status   MRSA by PCR POSITIVE (A) NEGATIVE Final    Comment:        The GeneXpert MRSA Assay  (FDA approved for NASAL specimens only), is one component of a comprehensive MRSA colonization surveillance program. It is not intended to diagnose MRSA infection nor to guide or monitor treatment for MRSA infections. RESULT CALLED TO, READ BACK BY AND VERIFIED WITH: [facility] RN 0255 06/14/15 MITCHELL,L     RADIOLOGY STUDIES/RESULTS: Ct Abdomen Pelvis W Contrast  06/13/2015   CLINICAL DATA:  Acute onset of generalized abdominal pain and vomiting. Fever. Initial encounter.  EXAM: CT ABDOMEN AND PELVIS WITH CONTRAST  TECHNIQUE: Multidetector CT imaging of the abdomen and pelvis was performed using the standard protocol following bolus administration of intravenous contrast.  CONTRAST:  OMNIPAQUE IOHEXOL 300 MG/ML  SOLN  COMPARISON:  None.  FINDINGS: The visualized lung bases are clear. Scattered coronary artery calcifications are seen. A pacemaker lead is partially imaged.  The liver and spleen are unremarkable in appearance. The gallbladder is within normal limits. The pancreas and adrenal glands are unremarkable.  Nonspecific perinephric stranding is noted bilaterally. Right renal cysts measure up to 2.4 cm in size. There is no evidence of hydronephrosis. No renal or ureteral stones are seen.  No free fluid is identified. The small bowel is unremarkable in appearance. The stomach is within normal limits. No acute vascular abnormalities are seen. Scattered calcification is noted along the distal abdominal aorta and its branches.  A moderate umbilical hernia is seen, containing only fat.  There appears to be a localized perforation at the right mid sigmoid colon, with scattered soft tissue air tracking into the surrounding fat, and extensive surrounding soft tissue inflammation and trace fluid. A small amount of peripherally enhancing fluid is noted, measuring up to 1.5 cm, without a definite drainable abscess. Diffuse soft tissue edema extends superiorly along the mesentery, and  into the pelvis,  with inflammation tracking about the decompressed bladder. Soft tissue inflammation extends about adjacent small bowel loops.  This reflects acute diverticulitis at the mid sigmoid colon, with associated colonic wall thickening. Underlying diverticulosis involves the distal descending and sigmoid colon.  The appendix is normal in caliber, partially obscured by surrounding soft tissue inflammation.  The bladder is decompressed and not well assessed. The prostate is mildly enlarged, measuring 4.9 cm in transverse dimension. No inguinal lymphadenopathy is seen.  No acute osseous abnormalities are identified.  IMPRESSION: 1. Localized perforation at the right mid sigmoid colon, with scattered soft tissue air tracking into the surrounding fat, and extensive surrounding soft tissue inflammation and trace fluid. Small peripherally enhancing fluid collection measuring 1.5 cm noted, without a definite drainable abscess. Diffuse soft tissue edema extends superiorly along the mesentery, and into the pelvis, with inflammation tracking about the decompressed bladder. Soft tissue inflammation tracks about adjacent small bowel loops. This reflects acute diverticulitis at the mid sigmoid colon, with associated colonic wall thickening. 2. Underlying diverticulosis involves the distal descending and sigmoid colon. 3. Mildly enlarged prostate. 4. Right renal cyst noted. 5. Scattered coronary artery calcifications noted. 6. Moderate umbilical hernia, containing only fat. 7. Scattered calcification along the distal abdominal aorta and its branches. Critical Value/emergent results were called by telephone at the time of interpretation on 06/13/2015 at 9:49 pm to Sharilyn Sites PA, who verbally acknowledged these results.   Electronically Signed   By: Roanna Raider M.D.   On: 06/13/2015 21:57    Jeoffrey Massed, MD  [facility] Pager:[phone]  If 7PM-7AM, please contact night-coverage [email or web address] Password  [email or web address] 06/17/2015, 2:42 PM   LOS: 4 days

## 2015-06-17 NOTE — Progress Notes (Signed)
Dr. Sloan Leiter aware of no IV access for pt. Pt does not want IV and refuses new IV. Md okay with no iv access.

## 2015-06-17 NOTE — Discharge Summary (Signed)
PATIENT DETAILS Name: Nathan Miller Age: 64 y.o. Sex: male Date of Birth: 01-Nov-1950 MRN: [id number]. Admitting Physician: Alba Cory, MD [id number], [place] Miners, PA-C  Admit Date: 06/13/2015 Discharge date: 06/18/2015  Recommendations for Outpatient Follow-up:  1. Please ensure follow-up with general surgery-Will need a repeat CT abdomen once patient completes antibiotics course.  2. Please recheck electrolytes at next visit with PCP.  PRIMARY DISCHARGE DIAGNOSIS:  Principal Problem:   Diverticulitis of intestine with perforation Active Problems:   CAD S/P percutaneous coronary angioplasty; bms X 2 to RCA 2001   Bradycardia   Complete heart block   Diabetes mellitus type 2, uncontrolled   Diverticulitis   Diverticulitis of intestine with perforation without bleeding   Blood poisoning      PAST MEDICAL HISTORY: Past Medical History  Diagnosis Date  . Osteomyelitis   . Diabetes mellitus   . CAD (coronary artery disease)     NSTEMI in 2001. Cardiac cath showed: LAD: 40% proximal, LCX: 70% mid, RCA thrombotic occlusion in mid segment. s/p PCI and 2 overlapped BMSs to RCA.   [name] Hypertension   . CHF (congestive heart failure)     DISCHARGE MEDICATIONS: Current Discharge Medication List    START taking these medications   Details  ciprofloxacin (CIPRO) 500 MG tablet Take 1 tablet (500 mg total) by mouth 2 (two) times daily. Qty: 18 tablet, Refills: 0    metroNIDAZOLE (FLAGYL) 500 MG tablet Take 1 tablet (500 mg total) by mouth every 8 (eight) hours. Qty: 27 tablet, Refills: 0      CONTINUE these medications which have NOT CHANGED   Details  aspirin EC 81 MG tablet Take 1 tablet (81 mg total) by mouth daily. Qty: 30 tablet, Refills: 0    atorvastatin (LIPITOR) 80 MG tablet Take 1 tablet (80 mg total) by mouth daily at 6 PM. Qty: 30 tablet, Refills: 0    bismuth subsalicylate (PEPTO BISMOL) 262 MG/15ML suspension Take 30 mLs by mouth every 6 (six) hours as  needed.    carvedilol (COREG) 6.25 MG tablet Take 1 tablet (6.25 mg total) by mouth 2 (two) times daily with a meal. Qty: 60 tablet, Refills: 0    insulin aspart (NOVOLOG) 100 UNIT/ML FlexPen Inject 3 Units into the skin 3 (three) times daily with meals. Qty: 15 mL, Refills: 0    insulin glargine (LANTUS) 100 unit/mL SOPN Inject 0.12 mLs (12 Units total) into the skin at bedtime. Qty: 15 mL, Refills: 0    lisinopril (PRINIVIL,ZESTRIL) 10 MG tablet Take 1 tablet (10 mg total) by mouth daily. Qty: 30 tablet, Refills: 0    metFORMIN (GLUCOPHAGE) 500 MG tablet Take 1 tablet (500 mg total) by mouth 2 (two) times daily with a meal. Qty: 60 tablet, Refills: 0    nitroGLYCERIN (NITROSTAT) 0.4 MG SL tablet Place 1 tablet (0.4 mg total) under the tongue every 5 (five) minutes as needed for chest pain. Qty: 20 tablet, Refills: 0    blood glucose meter kit and supplies Dispense based on patient and insurance preference. Use up to four times daily as directed. (FOR ICD-9 250.00, 250.01). Qty: 1 each, Refills: 0        ALLERGIES:   Allergies  Allergen Reactions  . Penicillins Shortness Of Breath    BRIEF HPI:  See H&P, Labs, Consult and Test reports for all details in brief, patient  is a 64 y.o. male with PMH significant for Diabetes, CAD, S/P PCI, CHF, HTN, complete Heart Block  S/P pacemaker 12-2014, chronic osteomyelitis right foot who presented with abd pain-found to have diverticulitis with perforation.   CONSULTATIONS:   general surgery  PERTINENT RADIOLOGIC STUDIES: Ct Abdomen Pelvis W Contrast  06/13/2015   CLINICAL DATA:  Acute onset of generalized abdominal pain and vomiting. Fever. Initial encounter.  EXAM: CT ABDOMEN AND PELVIS WITH CONTRAST  TECHNIQUE: Multidetector CT imaging of the abdomen and pelvis was performed using the standard protocol following bolus administration of intravenous contrast.  CONTRAST:  OMNIPAQUE IOHEXOL 300 MG/ML  SOLN  COMPARISON:  None.   FINDINGS: The visualized lung bases are clear. Scattered coronary artery calcifications are seen. A pacemaker lead is partially imaged.  The liver and spleen are unremarkable in appearance. The gallbladder is within normal limits. The pancreas and adrenal glands are unremarkable.  Nonspecific perinephric stranding is noted bilaterally. Right renal cysts measure up to 2.4 cm in size. There is no evidence of hydronephrosis. No renal or ureteral stones are seen.  No free fluid is identified. The small bowel is unremarkable in appearance. The stomach is within normal limits. No acute vascular abnormalities are seen. Scattered calcification is noted along the distal abdominal aorta and its branches.  A moderate umbilical hernia is seen, containing only fat.  There appears to be a localized perforation at the right mid sigmoid colon, with scattered soft tissue air tracking into the surrounding fat, and extensive surrounding soft tissue inflammation and trace fluid. A small amount of peripherally enhancing fluid is noted, measuring up to 1.5 cm, without a definite drainable abscess. Diffuse soft tissue edema extends superiorly along the mesentery, and into the pelvis, with inflammation tracking about the decompressed bladder. Soft tissue inflammation extends about adjacent small bowel loops.  This reflects acute diverticulitis at the mid sigmoid colon, with associated colonic wall thickening. Underlying diverticulosis involves the distal descending and sigmoid colon.  The appendix is normal in caliber, partially obscured by surrounding soft tissue inflammation.  The bladder is decompressed and not well assessed. The prostate is mildly enlarged, measuring 4.9 cm in transverse dimension. No inguinal lymphadenopathy is seen.  No acute osseous abnormalities are identified.  IMPRESSION: 1. Localized perforation at the right mid sigmoid colon, with scattered soft tissue air tracking into the surrounding fat, and extensive  surrounding soft tissue inflammation and trace fluid. Small peripherally enhancing fluid collection measuring 1.5 cm noted, without a definite drainable abscess. Diffuse soft tissue edema extends superiorly along the mesentery, and into the pelvis, with inflammation tracking about the decompressed bladder. Soft tissue inflammation tracks about adjacent small bowel loops. This reflects acute diverticulitis at the mid sigmoid colon, with associated colonic wall thickening. 2. Underlying diverticulosis involves the distal descending and sigmoid colon. 3. Mildly enlarged prostate. 4. Right renal cyst noted. 5. Scattered coronary artery calcifications noted. 6. Moderate umbilical hernia, containing only fat. 7. Scattered calcification along the distal abdominal aorta and its branches. Critical Value/emergent results were called by telephone at the time of interpretation on 06/13/2015 at 9:49 pm to Sharilyn Sites PA, who verbally acknowledged these results.   Electronically Signed   By: Roanna Raider M.D.   On: 06/13/2015 21:57     PERTINENT LAB RESULTS: CBC:  Recent Labs  06/16/15 0430  WBC 9.0  HGB 10.8*  HCT 32.4*  PLT 195   CMET CMP     Component Value Date/Time   NA 135 06/18/2015 0324   K 4.0 06/18/2015 0324   CL 100* 06/18/2015 0324   CO2 27 06/18/2015 0324  GLUCOSE 87 06/18/2015 0324   BUN <5* 06/18/2015 0324   CREATININE 0.72 06/18/2015 0324   CREATININE 0.71 03/20/2011 1014   CALCIUM 8.2* 06/18/2015 0324   PROT 6.6 06/13/2015 2325   ALBUMIN 3.0* 06/13/2015 2325   AST 16 06/13/2015 2325   ALT 11* 06/13/2015 2325   ALKPHOS 75 06/13/2015 2325   BILITOT 3.2* 06/13/2015 2325   GFRNONAA >60 06/18/2015 0324   GFRAA >60 06/18/2015 0324    GFR Estimated Creatinine Clearance: 104 mL/min (by C-G formula based on Cr of 0.72). No results for input(s): LIPASE, AMYLASE in the last 72 hours. No results for input(s): CKTOTAL, CKMB, CKMBINDEX, TROPONINI in the last 72 hours. Invalid  input(s): POCBNP No results for input(s): DDIMER in the last 72 hours. No results for input(s): HGBA1C in the last 72 hours. No results for input(s): CHOL, HDL, LDLCALC, TRIG, CHOLHDL, LDLDIRECT in the last 72 hours. No results for input(s): TSH, T4TOTAL, T3FREE, THYROIDAB in the last 72 hours.  Invalid input(s): FREET3 No results for input(s): VITAMINB12, FOLATE, FERRITIN, TIBC, IRON, RETICCTPCT in the last 72 hours. Coags: No results for input(s): INR in the last 72 hours.  Invalid input(s): PT Microbiology: Recent Results (from the past 240 hour(s))  Urine culture     Status: None   Collection Time: 06/13/15  9:00 PM  Result Value Ref Range Status   Specimen Description URINE, RANDOM  Final   Special Requests ADDED 0247 06/14/15  Final   Culture MULTIPLE SPECIES PRESENT, SUGGEST RECOLLECTION  Final   Report Status 06/15/2015 FINAL  Final  Culture, blood (routine x 2)     Status: None (Preliminary result)   Collection Time: 06/13/15 10:10 PM  Result Value Ref Range Status   Specimen Description BLOOD RIGHT FOREARM  Final   Special Requests BOTTLES DRAWN AEROBIC AND ANAEROBIC 5CC  Final   Culture NO GROWTH 4 DAYS  Final   Report Status PENDING  Incomplete  Culture, blood (routine x 2)     Status: None (Preliminary result)   Collection Time: 06/13/15 10:24 PM  Result Value Ref Range Status   Specimen Description BLOOD LEFT HAND  Final   Special Requests BOTTLES DRAWN AEROBIC AND ANAEROBIC 5CC  Final   Culture NO GROWTH 4 DAYS  Final   Report Status PENDING  Incomplete  MRSA PCR Screening     Status: Abnormal   Collection Time: 06/14/15 12:01 AM  Result Value Ref Range Status   MRSA by PCR POSITIVE (A) NEGATIVE Final    Comment:        The GeneXpert MRSA Assay (FDA approved for NASAL specimens only), is one component of a comprehensive MRSA colonization surveillance program. It is not intended to diagnose MRSA infection nor to guide or monitor treatment for MRSA  infections. RESULT CALLED TO, READ BACK BY AND VERIFIED WITH: [facility] RN 0255 06/14/15 MITCHELL,L      BRIEF HOSPITAL COURSE:  Diverticulitis of intestine with perforation: Managed with conservative measures-and empiric antibiotics. General surgery consulted-much improved-diet slowly advanced-has tolerated soft diet, stable for discharge. Patient to follow up with Gen Surgery for a repeat CT Abd at some point after he completes oral abx course.   Active Problems: Hypokalemia: Likely secondary to poor oral intake/hypomagnesemia-repleted and recheck at next visit with PCP  Essential hypertension: Antihypertensives initially held as nothing by mouth-blood pressure started creeping up-to resumd both lisinopril and Coreg. Follow closely in the outpatient setting.   CAD S/P percutaneous coronary angioplasty; bms X 2 to RCA  2001: Continue aspirin, statin and beta blocker  Type 2 diabetes: Resume metformin and Lantus.on discharge. CBG's stable  TODAY-DAY OF DISCHARGE:  Subjective:   Nathan Miller today has no headache,no chest abdominal pain,no new weakness tingling or numbness, feels much better wants to go home today.   Objective:   Blood pressure 150/70, pulse 69, temperature 98.3 F (36.8 C), temperature source Oral, resp. rate 18, height 5\' 6"  (1.676 m), weight 93.668 kg (206 lb 8 oz), SpO2 97 %.  Intake/Output Summary (Last 24 hours) at 06/18/15 0926 Last data filed at 06/18/15 0858  Gross per 24 hour  Intake   1450 ml  Output    375 ml  Net   1075 ml   Filed Weights   06/16/15 0539 06/17/15 0656 06/18/15 0705  Weight: 96.208 kg (212 lb 1.6 oz) 94.53 kg (208 lb 6.4 oz) 93.668 kg (206 lb 8 oz)    Exam Awake Alert, Oriented *3, No new F.N deficits, Normal affect [place].AT,PERRAL Supple Neck,No JVD, No cervical lymphadenopathy appriciated.  Symmetrical Chest wall movement, Good air movement bilaterally, CTAB RRR,No Gallops,Rubs or new Murmurs, No Parasternal Heave +ve B.Sounds,  Abd Soft, Non tender, No organomegaly appriciated, No rebound -guarding or rigidity. No Cyanosis, Clubbing or edema, No new Rash or bruise  DISCHARGE CONDITION: Stable  DISPOSITION: Home  DISCHARGE INSTRUCTIONS:    Activity:  As tolerated   Diet recommendation: Diabetic Diet Heart Healthy diet   Discharge Instructions    Call MD for:  persistant nausea and vomiting    Complete by:  As directed      Call MD for:  severe uncontrolled pain    Complete by:  As directed      Diet - low sodium heart healthy    Complete by:  As directed      Diet Carb Modified    Complete by:  As directed      Increase activity slowly    Complete by:  As directed            Follow-up Information    Follow up with TSUEI,MATTHEW K., MD In 2 weeks.   Specialty:  General Surgery   Why:  our office will call with follow up time and date as well as follow up CT scan   Contact information:   [address] [place] [address] [phone]       Follow up with EDENFIELD, [place] Miners, PA-C. Schedule an appointment as soon as possible for a visit in 1 week.   Specialty:  Physician Assistant   Contact information:   [address] [place] [address] [phone]       Total Time spent on discharge equals 25  minutes.  SignedJeoffrey Massed 06/18/2015 9:26 AM

## 2015-06-17 NOTE — Discharge Instructions (Signed)
Low fiber diet for 2-3 weeks, then switch to high fiber diet  Low-Fiber Diet Fiber is found in fruits, vegetables, and whole grains. A low-fiber diet restricts fibrous foods that are not digested in the small intestine. A diet containing about 10-15 grams of fiber per day is considered low fiber. Low-fiber diets may be used to:  Promote healing and rest the bowel during intestinal flare-ups.  Prevent blockage of a partially obstructed or narrowed gastrointestinal tract.  Reduce fecal weight and volume.  Slow the movement of feces. You may be on a low-fiber diet as a transitional diet following surgery, after an injury (trauma), or because of a short (acute) or lifelong (chronic) illness. Your health care provider will determine the length of time you need to stay on this diet.  WHAT DO I NEED TO KNOW ABOUT A LOW-FIBER DIET? Always check the fiber content on the packaging's Nutrition Facts label, especially on foods from the grains list. Ask your dietitian if you have questions about specific foods that are related to your condition, especially if the food is not listed below. In general, a low-fiber food will have less than 2 g of fiber. WHAT FOODS CAN I EAT? Grains All breads and crackers made with white flour. Sweet rolls, doughnuts, waffles, pancakes, [place] toast, bagels. Pretzels, Melba toast, zwieback. Well-cooked cereals, such as cornmeal, farina, or cream cereals. Dry cereals that do not contain whole grains, fruit, or nuts, such as refined corn, wheat, rice, and oat cereals. Potatoes prepared any way without skins, plain pastas and noodles, refined white rice. Use white flour for baking and making sauces. Use allowed list of grains for casseroles, dumplings, and puddings.  Vegetables Strained tomato and vegetable juices. Fresh lettuce, cucumber, spinach. Well-cooked (no skin or pulp) or canned vegetables, such as asparagus, bean sprouts, beets, carrots, green beans, mushrooms, potatoes,  pumpkin, spinach, yellow squash, tomato sauce/puree, turnips, yams, and zucchini. Keep servings limited to  cup.  Fruits All fruit juices except prune juice. Cooked or canned fruits without skin and seeds, such as applesauce, apricots, cherries, fruit cocktail, grapefruit, grapes, mandarin oranges, melons, peaches, pears, pineapple, and plums. Fresh fruits without skin, such as apricots, avocados, bananas, melons, pineapple, nectarines, and peaches. Keep servings limited to  cup or 1 piece.  Meat and Other Protein Sources Ground or well-cooked tender beef, ham, veal, lamb, pork, or poultry. Eggs, plain cheese. Fish, oysters, shrimp, lobster, and other seafood. Liver, organ meats. Smooth nut butters. Dairy All milk products and alternative dairy substitutes, such as soy, rice, almond, and coconut, not containing added whole nuts, seeds, or added fruit. Beverages Decaf coffee, fruit, and vegetable juices or smoothies (small amounts, with no pulp or skins, and with fruits from allowed list), sports drinks, herbal tea. Condiments Ketchup, mustard, vinegar, cream sauce, cheese sauce, cocoa powder. Spices in moderation, such as allspice, basil, bay leaves, celery powder or leaves, cinnamon, cumin powder, curry powder, ginger, mace, marjoram, onion or garlic powder, oregano, paprika, parsley flakes, ground pepper, rosemary, sage, savory, tarragon, thyme, and turmeric. Sweets and Desserts Plain cakes and cookies, pie made with allowed fruit, pudding, custard, cream pie. Gelatin, fruit, ice, sherbet, frozen ice pops. Ice cream, ice milk without nuts. Plain hard candy, honey, jelly, molasses, syrup, sugar, chocolate syrup, gumdrops, marshmallows. Limit overall sugar intake.  Fats and Oil Margarine, butter, cream, mayonnaise, salad oils, plain salad dressings made from allowed foods. Choose healthy fats such as olive oil, canola oil, and omega-3 fatty acids (such as found in  salmon or tuna) when possible.    Other Bouillon, broth, or cream soups made from allowed foods. Any strained soup. Casseroles or mixed dishes made with allowed foods. The items listed above may not be a complete list of recommended foods or beverages. Contact your dietitian for more options.  WHAT FOODS ARE NOT RECOMMENDED? Grains All whole wheat and whole grain breads and crackers. Multigrains, rye, bran seeds, nuts, or coconut. Cereals containing whole grains, multigrains, bran, coconut, nuts, raisins. Cooked or dry oatmeal, steel-cut oats. Coarse wheat cereals, granola. Cereals advertised as high fiber. Potato skins. Whole grain pasta, wild or brown rice. Popcorn. Coconut flour. Bran, buckwheat, corn bread, multigrains, rye, wheat germ.  Vegetables Fresh, cooked or canned vegetables, such as artichokes, asparagus, beet greens, broccoli, Brussels sprouts, cabbage, celery, cauliflower, corn, eggplant, kale, legumes or beans, okra, peas, and tomatoes. Avoid large servings of any vegetables, especially raw vegetables.  Fruits Fresh fruits, such as apples with or without skin, berries, cherries, figs, grapes, grapefruit, guavas, kiwis, mangoes, oranges, papayas, pears, persimmons, pineapple, and pomegranate. Prune juice and juices with pulp, stewed or dried prunes. Dried fruits, dates, raisins. Fruit seeds or skins. Avoid large servings of all fresh fruits. Meats and Other Protein Sources Tough, fibrous meats with gristle. Chunky nut butter. Cheese made with seeds, nuts, or other foods not recommended. Nuts, seeds, legumes (beans, including baked beans), dried peas, beans, lentils.  Dairy Yogurt or cheese that contains nuts, seeds, or added fruit.  Beverages Fruit juices with high pulp, prune juice. Caffeinated coffee and teas.  Condiments Coconut, maple syrup, pickles, olives. Sweets and Desserts Desserts, cookies, or candies that contain nuts or coconut, chunky peanut butter, dried fruits. Jams, preserves with seeds, marmalade.  Large amounts of sugar and sweets. Any other dessert made with fruits from the not recommended list.  Other Soups made from vegetables that are not recommended or that contain other foods not recommended.  The items listed above may not be a complete list of foods and beverages to avoid. Contact your dietitian for more information. Document Released: 04/07/2002 Document Revised: 10/21/2013 Document Reviewed: 09/08/2013 [facility] Patient Information 2015 [place], [place]. This information is not intended to replace advice given to you by your health care provider. Make sure you discuss any questions you have with your health care provider.  High-Fiber Diet Fiber is found in fruits, vegetables, and grains. A high-fiber diet encourages the addition of more whole grains, legumes, fruits, and vegetables in your diet. The recommended amount of fiber for adult males is 38 g per day. For adult females, it is 25 g per day. Pregnant and lactating women should get 28 g of fiber per day. If you have a digestive or bowel problem, ask your caregiver for advice before adding high-fiber foods to your diet. Eat a variety of high-fiber foods instead of only a select few type of foods.  PURPOSE  To increase stool bulk.  To make bowel movements more regular to prevent constipation.  To lower cholesterol.  To prevent overeating. WHEN IS THIS DIET USED?  It may be used if you have constipation and hemorrhoids.  It may be used if you have uncomplicated diverticulosis (intestine condition) and irritable bowel syndrome.  It may be used if you need help with weight management.  It may be used if you want to add it to your diet as a protective measure against atherosclerosis, diabetes, and cancer. SOURCES OF FIBER  Whole-grain breads and cereals.  Fruits, such as apples, oranges, bananas,  berries, prunes, and pears.  Vegetables, such as green peas, carrots, sweet potatoes, beets, broccoli, cabbage, spinach, and  artichokes.  Legumes, such split peas, soy, lentils.  Almonds. FIBER CONTENT IN FOODS Starches and Grains / Dietary Fiber (g)  Cheerios, 1 cup / 3 g  Corn Flakes cereal, 1 cup / 0.7 g  Rice crispy treat cereal, 1 cup / 0.3 g  Instant oatmeal (cooked),  cup / 2 g  Frosted wheat cereal, 1 cup / 5.1 g  Brown, long-grain rice (cooked), 1 cup / 3.5 g  White, long-grain rice (cooked), 1 cup / 0.6 g  Enriched macaroni (cooked), 1 cup / 2.5 g Legumes / Dietary Fiber (g)  Baked beans (canned, plain, or vegetarian),  cup / 5.2 g  Kidney beans (canned),  cup / 6.8 g  Pinto beans (cooked),  cup / 5.5 g Breads and Crackers / Dietary Fiber (g)  Plain or honey graham crackers, 2 squares / 0.7 g  Saltine crackers, 3 squares / 0.3 g  Plain, salted pretzels, 10 pieces / 1.8 g  Whole-wheat bread, 1 slice / 1.9 g  White bread, 1 slice / 0.7 g  Raisin bread, 1 slice / 1.2 g  Plain bagel, 3 oz / 2 g  Flour tortilla, 1 oz / 0.9 g  Corn tortilla, 1 small / 1.5 g  Hamburger or hotdog bun, 1 small / 0.9 g Fruits / Dietary Fiber (g)  Apple with skin, 1 medium / 4.4 g  Sweetened applesauce,  cup / 1.5 g  Banana,  medium / 1.5 g  Grapes, 10 grapes / 0.4 g  Orange, 1 small / 2.3 g  Raisin, 1.5 oz / 1.6 g  Melon, 1 cup / 1.4 g Vegetables / Dietary Fiber (g)  Green beans (canned),  cup / 1.3 g  Carrots (cooked),  cup / 2.3 g  Broccoli (cooked),  cup / 2.8 g  Peas (cooked),  cup / 4.4 g  Mashed potatoes,  cup / 1.6 g  Lettuce, 1 cup / 0.5 g  Corn (canned),  cup / 1.6 g  Tomato,  cup / 1.1 g Document Released: 10/16/2005 Document Revised: 04/16/2012 Document Reviewed: 01/18/2012 [employer] Patient Information 2015 [place], [place]. This information is not intended to replace advice given to you by your health care provider. Make sure you discuss any questions you have with your health care provider.

## 2015-06-17 NOTE — Progress Notes (Signed)
Patient ID: NKRUMAH HACKERT, male   DOB: 06-28-1951, 64 y.o.   MRN: [id number]    Subjective: Pt feels well.  No pain.  Tolerating full liquids  Objective: Vital signs in last 24 hours: Temp:  [98 F (36.7 C)-99.2 F (37.3 C)] 98 F (36.7 C) (08/18 1000) Pulse Rate:  [50-64] 64 (08/18 1000) Resp:  [16-19] 19 (08/18 0647) BP: (126-162)/(51-85) 150/60 mmHg (08/18 1000) SpO2:  [96 %-99 %] 96 % (08/18 1000) Weight:  [94.53 kg (208 lb 6.4 oz)] 94.53 kg (208 lb 6.4 oz) (08/18 0656) Last BM Date: 06/14/15  Intake/Output from previous day: 08/17 0701 - 08/18 0700 In: 2605 [P.O.:600; I.V.:1755; IV Piggyback:250] Out: 800 [Urine:800] Intake/Output this shift:    PE: Abd: soft, Nt, ND, +BS  Lab Results:   Recent Labs  06/15/15 0347 06/16/15 0430  WBC 13.5* 9.0  HGB 11.3* 10.8*  HCT 34.1* 32.4*  PLT 157 195   BMET  Recent Labs  06/16/15 0430 06/17/15 0341  NA 130* 136  K 2.8* 2.9*  CL 98* 102  CO2 24 29  GLUCOSE 100* 109*  BUN 8 <5*  CREATININE 0.79 0.74  CALCIUM 7.8* 8.0*   PT/INR No results for input(s): LABPROT, INR in the last 72 hours. CMP     Component Value Date/Time   NA 136 06/17/2015 0341   K 2.9* 06/17/2015 0341   CL 102 06/17/2015 0341   CO2 29 06/17/2015 0341   GLUCOSE 109* 06/17/2015 0341   BUN <5* 06/17/2015 0341   CREATININE 0.74 06/17/2015 0341   CREATININE 0.71 03/20/2011 1014   CALCIUM 8.0* 06/17/2015 0341   PROT 6.6 06/13/2015 2325   ALBUMIN 3.0* 06/13/2015 2325   AST 16 06/13/2015 2325   ALT 11* 06/13/2015 2325   ALKPHOS 75 06/13/2015 2325   BILITOT 3.2* 06/13/2015 2325   GFRNONAA >60 06/17/2015 0341   GFRAA >60 06/17/2015 0341   Lipase     Component Value Date/Time   LIPASE 13* 06/13/2015 1615       Studies/Results: No results found.  Anti-infectives: Anti-infectives    Start     Dose/Rate Route Frequency Ordered Stop   06/17/15 1400  metroNIDAZOLE (FLAGYL) tablet 500 mg     500 mg Oral 3 times per day 06/17/15 1022      06/17/15 1030  ciprofloxacin (CIPRO) tablet 500 mg     500 mg Oral 2 times daily 06/17/15 1022     06/14/15 1100  ciprofloxacin (CIPRO) IVPB 400 mg  Status:  Discontinued     400 mg 200 mL/hr over 60 Minutes Intravenous Every 12 hours 06/13/15 2358 06/17/15 1022   06/14/15 0600  metroNIDAZOLE (FLAGYL) IVPB 500 mg  Status:  Discontinued     500 mg 100 mL/hr over 60 Minutes Intravenous Every 8 hours 06/13/15 2359 06/17/15 1022   06/13/15 2315  ciprofloxacin (CIPRO) IVPB 400 mg  Status:  Discontinued     400 mg 200 mL/hr over 60 Minutes Intravenous Every 12 hours 06/13/15 2303 06/13/15 2358   06/13/15 2315  metroNIDAZOLE (FLAGYL) IVPB 500 mg  Status:  Discontinued     500 mg 100 mL/hr over 60 Minutes Intravenous Every 8 hours 06/13/15 2303 06/13/15 2359   06/13/15 2145  ciprofloxacin (CIPRO) IVPB 400 mg     400 mg 200 mL/hr over 60 Minutes Intravenous  Once 06/13/15 2144 06/14/15 0024   06/13/15 2145  metroNIDAZOLE (FLAGYL) IVPB 500 mg     500 mg 100 mL/hr over 60 Minutes  Intravenous  Once 06/13/15 2144 06/13/15 2333       Assessment/Plan  1. 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  If tolerates can DC home -14 days total of abx therapy -repeat CT in 1-2 weeks.  Follow up in our office in 2 weeks. -low fiber diet for 2-3 weeks, then switch to high fiber diet   LOS: 4 days    Delando Satter E 06/17/2015, 10:33 AM Pager: [phone]

## 2015-06-18 ENCOUNTER — Other Ambulatory Visit: Payer: Self-pay | Admitting: Surgery

## 2015-06-18 DIAGNOSIS — L0291 Cutaneous abscess, unspecified: Secondary | ICD-10-CM

## 2015-06-18 DIAGNOSIS — K5792 Diverticulitis of intestine, part unspecified, without perforation or abscess without bleeding: Secondary | ICD-10-CM

## 2015-06-18 DIAGNOSIS — K5732 Diverticulitis of large intestine without perforation or abscess without bleeding: Secondary | ICD-10-CM

## 2015-06-18 LAB — BASIC METABOLIC PANEL
Anion gap: 8 (ref 5–15)
BUN: <5 mg/dL — ABNORMAL LOW (ref 6–20)
CO2: 27 mmol/L (ref 22–32)
Calcium: 8.2 mg/dL — ABNORMAL LOW (ref 8.9–10.3)
Chloride: 100 mmol/L — ABNORMAL LOW (ref 101–111)
Creatinine, Ser: 0.72 mg/dL (ref 0.61–1.24)
GFR calc Af Amer: >60 mL/min (ref >60)
GFR calc non Af Amer: >60 mL/min (ref >60)
Glucose, Bld: 87 mg/dL (ref 65–99)
Potassium: 4 mmol/L (ref 3.5–5.1)
Sodium: 135 mmol/L (ref 135–145)

## 2015-06-18 LAB — CULTURE, BLOOD (ROUTINE X 2)
Culture: NO GROWTH
Culture: NO GROWTH

## 2015-06-18 LAB — GLUCOSE, CAPILLARY
Glucose-Capillary: 82 mg/dL (ref 65–99)
Glucose-Capillary: 97 mg/dL (ref 65–99)

## 2015-06-18 NOTE — Progress Notes (Signed)
Pt discharged to home.  Discharge instructions completed with patient. Pt verbalizes understanding related to meds and diet regimen.

## 2015-06-24 ENCOUNTER — Ambulatory Visit: Payer: Medicare Other

## 2015-06-24 ENCOUNTER — Encounter (HOSPITAL_BASED_OUTPATIENT_CLINIC_OR_DEPARTMENT_OTHER): Payer: Medicare Other | Attending: Internal Medicine

## 2015-06-24 DIAGNOSIS — M86471 Chronic osteomyelitis with draining sinus, right ankle and foot: Secondary | ICD-10-CM | POA: Diagnosis not present

## 2015-06-24 DIAGNOSIS — G9009 Other idiopathic peripheral autonomic neuropathy: Secondary | ICD-10-CM | POA: Diagnosis not present

## 2015-06-24 DIAGNOSIS — L97512 Non-pressure chronic ulcer of other part of right foot with fat layer exposed: Secondary | ICD-10-CM | POA: Insufficient documentation

## 2015-06-24 DIAGNOSIS — I2510 Atherosclerotic heart disease of native coronary artery without angina pectoris: Secondary | ICD-10-CM | POA: Diagnosis not present

## 2015-06-24 DIAGNOSIS — I10 Essential (primary) hypertension: Secondary | ICD-10-CM | POA: Diagnosis not present

## 2015-06-24 DIAGNOSIS — E11621 Type 2 diabetes mellitus with foot ulcer: Secondary | ICD-10-CM | POA: Insufficient documentation

## 2015-06-24 DIAGNOSIS — I509 Heart failure, unspecified: Secondary | ICD-10-CM | POA: Insufficient documentation

## 2015-06-29 ENCOUNTER — Ambulatory Visit
Admission: RE | Admit: 2015-06-29 | Discharge: 2015-06-29 | Disposition: A | Discharge status: Home / Self Care | Payer: Medicare Other | Source: Ambulatory Visit | Attending: Surgery | Admitting: Surgery

## 2015-06-29 MED ORDER — IOPAMIDOL (ISOVUE-300) INJECTION 61%
125.0000 mL | Freq: Once | INTRAVENOUS | Status: AC | PRN
  Administered 2015-06-29: 125 mL via INTRAVENOUS

## 2015-06-30 NOTE — Progress Notes (Signed)
Quick Note:  Please call the patient and let them know that their scan shows that the area of infection is getting smaller. ______

## 2015-07-29 ENCOUNTER — Encounter (HOSPITAL_BASED_OUTPATIENT_CLINIC_OR_DEPARTMENT_OTHER): Payer: Medicare Other | Attending: Internal Medicine

## 2015-07-29 DIAGNOSIS — M86471 Chronic osteomyelitis with draining sinus, right ankle and foot: Secondary | ICD-10-CM | POA: Insufficient documentation

## 2015-07-29 DIAGNOSIS — L97512 Non-pressure chronic ulcer of other part of right foot with fat layer exposed: Secondary | ICD-10-CM | POA: Insufficient documentation

## 2015-07-29 DIAGNOSIS — G9009 Other idiopathic peripheral autonomic neuropathy: Secondary | ICD-10-CM | POA: Insufficient documentation

## 2015-08-02 ENCOUNTER — Ambulatory Visit (INDEPENDENT_AMBULATORY_CARE_PROVIDER_SITE_OTHER): Payer: Medicare Other | Admitting: *Deleted

## 2015-08-02 DIAGNOSIS — I441 Atrioventricular block, second degree: Secondary | ICD-10-CM

## 2015-08-02 NOTE — Progress Notes (Signed)
Remote pacemaker transmission.   

## 2015-08-04 LAB — CUP PACEART REMOTE DEVICE CHECK
Battery Remaining Longevity: 119 mo
Battery Voltage: 3.03 V
Brady Statistic RV Percent Paced: 42.06 %
Date Time Interrogation Session: 20161003130645
Lead Channel Impedance Value: 551 Ohm
Lead Channel Impedance Value: 646 Ohm
Lead Channel Pacing Threshold Amplitude: 0.625 V
Lead Channel Pacing Threshold Pulse Width: 0.4 ms
Lead Channel Sensing Intrinsic Amplitude: 8.25 mV
Lead Channel Sensing Intrinsic Amplitude: 8.25 mV
Lead Channel Setting Pacing Amplitude: 2.5 V
Lead Channel Setting Pacing Pulse Width: 0.4 ms
Lead Channel Setting Sensing Sensitivity: 0.9 mV
Zone Setting Detection Interval: 360 ms

## 2015-08-16 ENCOUNTER — Encounter: Payer: Self-pay | Admitting: *Deleted

## 2015-09-07 ENCOUNTER — Other Ambulatory Visit: Payer: Self-pay | Admitting: Gastroenterology

## 2015-09-16 ENCOUNTER — Encounter (HOSPITAL_BASED_OUTPATIENT_CLINIC_OR_DEPARTMENT_OTHER): Payer: Medicare Other | Attending: Internal Medicine

## 2015-09-16 DIAGNOSIS — M86671 Other chronic osteomyelitis, right ankle and foot: Secondary | ICD-10-CM | POA: Diagnosis present

## 2015-10-14 ENCOUNTER — Encounter (HOSPITAL_BASED_OUTPATIENT_CLINIC_OR_DEPARTMENT_OTHER): Payer: Medicare Other | Attending: Internal Medicine

## 2015-10-14 DIAGNOSIS — L97511 Non-pressure chronic ulcer of other part of right foot limited to breakdown of skin: Secondary | ICD-10-CM | POA: Insufficient documentation

## 2015-10-14 DIAGNOSIS — E1169 Type 2 diabetes mellitus with other specified complication: Secondary | ICD-10-CM | POA: Diagnosis not present

## 2015-10-14 DIAGNOSIS — M86671 Other chronic osteomyelitis, right ankle and foot: Secondary | ICD-10-CM | POA: Diagnosis not present

## 2015-10-14 DIAGNOSIS — E11621 Type 2 diabetes mellitus with foot ulcer: Secondary | ICD-10-CM | POA: Diagnosis not present

## 2015-10-14 DIAGNOSIS — E11610 Type 2 diabetes mellitus with diabetic neuropathic arthropathy: Secondary | ICD-10-CM | POA: Insufficient documentation

## 2015-10-14 DIAGNOSIS — E1151 Type 2 diabetes mellitus with diabetic peripheral angiopathy without gangrene: Secondary | ICD-10-CM | POA: Insufficient documentation

## 2015-11-02 ENCOUNTER — Ambulatory Visit (INDEPENDENT_AMBULATORY_CARE_PROVIDER_SITE_OTHER): Payer: PPO | Admitting: *Deleted

## 2015-11-02 ENCOUNTER — Telehealth: Payer: Self-pay | Admitting: Cardiology

## 2015-11-02 DIAGNOSIS — I442 Atrioventricular block, complete: Secondary | ICD-10-CM | POA: Diagnosis not present

## 2015-11-02 NOTE — Telephone Encounter (Signed)
LMOVM reminding pt to send remote transmission.   

## 2015-11-03 NOTE — Progress Notes (Signed)
Remote pacemaker transmission.   

## 2015-11-09 LAB — CUP PACEART REMOTE DEVICE CHECK
Battery Remaining Longevity: 112 mo
Battery Voltage: 3.02 V
Brady Statistic RV Percent Paced: 58.4 %
Date Time Interrogation Session: 20170104000223
Implantable Lead Implant Date: 20160327
Implantable Lead Location: 753860
Implantable Lead Model: 5076
Lead Channel Impedance Value: 551 Ohm
Lead Channel Impedance Value: 665 Ohm
Lead Channel Pacing Threshold Amplitude: 0.75 V
Lead Channel Pacing Threshold Pulse Width: 0.4 ms
Lead Channel Sensing Intrinsic Amplitude: 10.625 mV
Lead Channel Sensing Intrinsic Amplitude: 10.625 mV
Lead Channel Setting Pacing Amplitude: 2.5 V
Lead Channel Setting Pacing Pulse Width: 0.4 ms
Lead Channel Setting Sensing Sensitivity: 0.9 mV

## 2015-11-10 ENCOUNTER — Encounter: Payer: Self-pay | Admitting: Cardiology

## 2015-11-25 ENCOUNTER — Encounter (HOSPITAL_BASED_OUTPATIENT_CLINIC_OR_DEPARTMENT_OTHER): Payer: PPO | Attending: Internal Medicine

## 2015-11-25 DIAGNOSIS — L97811 Non-pressure chronic ulcer of other part of right lower leg limited to breakdown of skin: Secondary | ICD-10-CM | POA: Diagnosis not present

## 2015-11-25 DIAGNOSIS — M86471 Chronic osteomyelitis with draining sinus, right ankle and foot: Secondary | ICD-10-CM | POA: Diagnosis not present

## 2015-11-25 DIAGNOSIS — L97512 Non-pressure chronic ulcer of other part of right foot with fat layer exposed: Secondary | ICD-10-CM | POA: Diagnosis not present

## 2015-11-25 DIAGNOSIS — X35XXXA Volcanic eruption, initial encounter: Secondary | ICD-10-CM | POA: Diagnosis not present

## 2015-11-25 DIAGNOSIS — G9009 Other idiopathic peripheral autonomic neuropathy: Secondary | ICD-10-CM | POA: Insufficient documentation

## 2015-11-25 DIAGNOSIS — L97511 Non-pressure chronic ulcer of other part of right foot limited to breakdown of skin: Secondary | ICD-10-CM | POA: Diagnosis not present

## 2016-01-06 ENCOUNTER — Encounter (HOSPITAL_BASED_OUTPATIENT_CLINIC_OR_DEPARTMENT_OTHER): Payer: PPO | Attending: Internal Medicine

## 2016-01-06 DIAGNOSIS — L97511 Non-pressure chronic ulcer of other part of right foot limited to breakdown of skin: Secondary | ICD-10-CM | POA: Insufficient documentation

## 2016-01-06 DIAGNOSIS — M86671 Other chronic osteomyelitis, right ankle and foot: Secondary | ICD-10-CM | POA: Insufficient documentation

## 2016-01-13 DIAGNOSIS — M86471 Chronic osteomyelitis with draining sinus, right ankle and foot: Secondary | ICD-10-CM | POA: Diagnosis not present

## 2016-01-13 DIAGNOSIS — L97509 Non-pressure chronic ulcer of other part of unspecified foot with unspecified severity: Secondary | ICD-10-CM | POA: Diagnosis not present

## 2016-01-13 DIAGNOSIS — L97511 Non-pressure chronic ulcer of other part of right foot limited to breakdown of skin: Secondary | ICD-10-CM | POA: Diagnosis not present

## 2016-01-13 DIAGNOSIS — G9009 Other idiopathic peripheral autonomic neuropathy: Secondary | ICD-10-CM | POA: Diagnosis not present

## 2016-01-13 DIAGNOSIS — M86671 Other chronic osteomyelitis, right ankle and foot: Secondary | ICD-10-CM | POA: Diagnosis not present

## 2016-01-13 DIAGNOSIS — L97512 Non-pressure chronic ulcer of other part of right foot with fat layer exposed: Secondary | ICD-10-CM | POA: Diagnosis not present

## 2016-01-29 DIAGNOSIS — E669 Obesity, unspecified: Secondary | ICD-10-CM | POA: Insufficient documentation

## 2016-01-29 DIAGNOSIS — K429 Umbilical hernia without obstruction or gangrene: Secondary | ICD-10-CM | POA: Insufficient documentation

## 2016-01-29 DIAGNOSIS — E119 Type 2 diabetes mellitus without complications: Secondary | ICD-10-CM | POA: Insufficient documentation

## 2016-01-29 DIAGNOSIS — F1290 Cannabis use, unspecified, uncomplicated: Secondary | ICD-10-CM | POA: Insufficient documentation

## 2016-01-31 DIAGNOSIS — E785 Hyperlipidemia, unspecified: Secondary | ICD-10-CM | POA: Diagnosis not present

## 2016-01-31 DIAGNOSIS — E118 Type 2 diabetes mellitus with unspecified complications: Secondary | ICD-10-CM | POA: Diagnosis not present

## 2016-01-31 DIAGNOSIS — I10 Essential (primary) hypertension: Secondary | ICD-10-CM | POA: Diagnosis not present

## 2016-02-01 ENCOUNTER — Ambulatory Visit (INDEPENDENT_AMBULATORY_CARE_PROVIDER_SITE_OTHER): Payer: PPO | Admitting: *Deleted

## 2016-02-01 DIAGNOSIS — I442 Atrioventricular block, complete: Secondary | ICD-10-CM | POA: Diagnosis not present

## 2016-02-01 NOTE — Progress Notes (Signed)
Remote pacemaker transmission.   

## 2016-02-02 DIAGNOSIS — E782 Mixed hyperlipidemia: Secondary | ICD-10-CM | POA: Diagnosis not present

## 2016-02-02 DIAGNOSIS — I10 Essential (primary) hypertension: Secondary | ICD-10-CM | POA: Diagnosis not present

## 2016-02-02 DIAGNOSIS — M86671 Other chronic osteomyelitis, right ankle and foot: Secondary | ICD-10-CM | POA: Diagnosis not present

## 2016-02-02 DIAGNOSIS — I442 Atrioventricular block, complete: Secondary | ICD-10-CM | POA: Diagnosis not present

## 2016-02-02 DIAGNOSIS — E6609 Other obesity due to excess calories: Secondary | ICD-10-CM | POA: Diagnosis not present

## 2016-02-02 DIAGNOSIS — E11622 Type 2 diabetes mellitus with other skin ulcer: Secondary | ICD-10-CM | POA: Diagnosis not present

## 2016-02-02 DIAGNOSIS — I25119 Atherosclerotic heart disease of native coronary artery with unspecified angina pectoris: Secondary | ICD-10-CM | POA: Diagnosis not present

## 2016-02-24 ENCOUNTER — Encounter (HOSPITAL_BASED_OUTPATIENT_CLINIC_OR_DEPARTMENT_OTHER): Payer: PPO | Attending: Internal Medicine

## 2016-02-24 DIAGNOSIS — E1121 Type 2 diabetes mellitus with diabetic nephropathy: Secondary | ICD-10-CM | POA: Insufficient documentation

## 2016-02-24 DIAGNOSIS — G9009 Other idiopathic peripheral autonomic neuropathy: Secondary | ICD-10-CM | POA: Diagnosis not present

## 2016-02-24 DIAGNOSIS — L84 Corns and callosities: Secondary | ICD-10-CM | POA: Diagnosis not present

## 2016-02-24 DIAGNOSIS — M86671 Other chronic osteomyelitis, right ankle and foot: Secondary | ICD-10-CM | POA: Diagnosis not present

## 2016-02-24 DIAGNOSIS — Z89421 Acquired absence of other right toe(s): Secondary | ICD-10-CM | POA: Insufficient documentation

## 2016-02-24 DIAGNOSIS — M86471 Chronic osteomyelitis with draining sinus, right ankle and foot: Secondary | ICD-10-CM | POA: Diagnosis not present

## 2016-02-24 DIAGNOSIS — L97511 Non-pressure chronic ulcer of other part of right foot limited to breakdown of skin: Secondary | ICD-10-CM | POA: Diagnosis not present

## 2016-03-07 ENCOUNTER — Ambulatory Visit (INDEPENDENT_AMBULATORY_CARE_PROVIDER_SITE_OTHER): Payer: PPO | Admitting: Orthopaedic Surgery

## 2016-03-07 ENCOUNTER — Ambulatory Visit (INDEPENDENT_AMBULATORY_CARE_PROVIDER_SITE_OTHER): Payer: PPO

## 2016-03-07 ENCOUNTER — Encounter: Payer: Self-pay | Admitting: Orthopaedic Surgery

## 2016-03-07 VITALS — BP 184/78 | HR 50 | Temp 97.7°F | Resp 16 | Ht 66.0 in | Wt 226.0 lb

## 2016-03-07 DIAGNOSIS — M25562 Pain in left knee: Secondary | ICD-10-CM

## 2016-03-07 DIAGNOSIS — I442 Atrioventricular block, complete: Secondary | ICD-10-CM

## 2016-03-07 DIAGNOSIS — I10 Essential (primary) hypertension: Secondary | ICD-10-CM
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
## 2016-03-21 ENCOUNTER — Ambulatory Visit: Payer: PPO | Admitting: Orthopaedic Surgery

## 2016-03-23 ENCOUNTER — Encounter: Payer: Self-pay | Admitting: Cardiology

## 2016-03-23 LAB — CUP PACEART REMOTE DEVICE CHECK
Battery Remaining Longevity: 108 mo
Battery Voltage: 3.02 V
Brady Statistic RV Percent Paced: 99.74 %
Date Time Interrogation Session: 20170407153016
Implantable Lead Implant Date: 20160327
Implantable Lead Location: 753860
Implantable Lead Model: 5076
Lead Channel Impedance Value: 513 Ohm
Lead Channel Impedance Value: 646 Ohm
Lead Channel Pacing Threshold Amplitude: 0.625 V
Lead Channel Pacing Threshold Pulse Width: 0.4 ms
Lead Channel Sensing Intrinsic Amplitude: 10.4 mV
Lead Channel Sensing Intrinsic Amplitude: 8.375 mV
Lead Channel Setting Pacing Amplitude: 2.5 V
Lead Channel Setting Pacing Pulse Width: 0.4 ms
Lead Channel Setting Sensing Sensitivity: 0.9 mV

## 2016-03-28 ENCOUNTER — Encounter: Payer: Self-pay | Admitting: Orthopaedic Surgery

## 2016-03-28 ENCOUNTER — Ambulatory Visit (INDEPENDENT_AMBULATORY_CARE_PROVIDER_SITE_OTHER): Payer: PPO | Admitting: Orthopaedic Surgery

## 2016-03-28 VITALS — BP 156/75 | HR 50 | Temp 97.7°F | Ht 66.0 in | Wt 226.0 lb

## 2016-03-28 DIAGNOSIS — I10 Essential (primary) hypertension: Secondary | ICD-10-CM

## 2016-03-28 DIAGNOSIS — M25562 Pain in left knee: Secondary | ICD-10-CM

## 2016-03-28 DIAGNOSIS — I442 Atrioventricular block, complete: Secondary | ICD-10-CM

## 2016-03-28 NOTE — Patient Instructions (Addendum)
Rx; given for Synvisc for his left knee.  The patient is to check with the pharmacy to get it filled and bring it back for the injections. He is to call and make a return appointment when he has the medication.

## 2016-03-28 NOTE — Progress Notes (Signed)
Patient WU:JWJX DANNIAL DRENNEN, male DOB:[birth year], 65 y.o. BJY:[id number]  Chief Complaint  Patient presents with  . Follow-up    Left knee pain    HPI  MALIKA AURICH is a 65 y.o. male who is seen for the left knee pain that is chronic.  I gave him an injection last time of DepoMedrol which helped.  He has had viscosupplementation in the past and would like to have it again.  It has been about five years.  He understands about coming weekly for three weeks.  He has swelling of the left knee, crepitus but no giving way or locking.  I have given him an Rx for Synvisc.  He will obtain it and call us and then begin the weekly injections for three weeks.  I have gone over the risks and imponderables.   HPI  Body mass index is 36.49 kg/(m^2).  ROS  Review of Systems  HENT: Negative for congestion.   Respiratory: Positive for shortness of breath. Negative for cough.   Cardiovascular: Negative for chest pain and leg swelling.  Endocrine: Positive for cold intolerance.  Musculoskeletal: Positive for joint swelling, arthralgias and gait problem.  Allergic/Immunologic: Positive for environmental allergies.  Neurological: Negative for numbness.  Psychiatric/Behavioral: The patient is nervous/anxious.     Past Medical History  Diagnosis Date  . Osteomyelitis (HCC)   . Diabetes mellitus   . CAD (coronary artery disease)     NSTEMI in 2001. Cardiac cath showed: LAD: 40% proximal, LCX: 70% mid, RCA thrombotic occlusion in mid segment. s/p PCI and 2 overlapped BMSs to RCA.   [name] Hypertension   . CHF (congestive heart failure) [facility])     Past Surgical History  Procedure Laterality Date  . Cardiac catheterization    . Coronary angioplasty    . Temporary pacemaker insertion N/A 01/21/2015    Procedure: TEMPORARY PACEMAKER INSERTION;  Surgeon: Marykay Lex, MD;  Location: [facility] CATH LAB;  Service: Cardiovascular;  Laterality: N/A;  . Permanent pacemaker insertion N/A 01/22/2015    MDT Advisa pacemaker  implanted for complete heart block by Dr Johney Frame    Family History  Problem Relation Age of Onset  . Heart disease Father   . COPD Father   . Heart disease Paternal Grandfather     Social History Social History  Substance Use Topics  . Smoking status: Former Smoker -- 0.30 packs/day for 40 years    Types: Cigarettes  . Smokeless tobacco: Former Neurosurgeon  . Alcohol Use: 25.2 oz/week    42 Cans of beer per week     Comment: beer    Allergies  Allergen Reactions  . Penicillins Shortness Of Breath    Current Outpatient Prescriptions  Medication Sig Dispense Refill  . aspirin EC 81 MG tablet Take 1 tablet (81 mg total) by mouth daily. 30 tablet 0  . carvedilol (COREG) 6.25 MG tablet Take 1 tablet (6.25 mg total) by mouth 2 (two) times daily with a meal. 60 tablet 0  . lisinopril (PRINIVIL,ZESTRIL) 10 MG tablet Take 1 tablet (10 mg total) by mouth daily. 30 tablet 0  . metFORMIN (GLUCOPHAGE) 500 MG tablet Take 1 tablet (500 mg total) by mouth 2 (two) times daily with a meal. 60 tablet 0  . nitroGLYCERIN (NITROSTAT) 0.4 MG SL tablet Place 1 tablet (0.4 mg total) under the tongue every 5 (five) minutes as needed for chest pain. 20 tablet 0   No current facility-administered medications for this visit.     Physical  Exam  Blood pressure 156/75, pulse 50, temperature 97.7 F (36.5 C), height 5\' 6"  (1.676 m), weight 226 lb (102.513 kg).  Constitutional: overall normal hygiene, normal nutrition, well developed, normal grooming, normal body habitus. Assistive device:none  Musculoskeletal: gait and station Limp left, muscle tone and strength are normal, no tremors or atrophy is present.  .  Neurological: coordination overall normal.  Deep tendon reflex/nerve stretch intact.  Sensation normal.  Cranial nerves II-XII intact.   Skin:   normal overall no scars, lesions, ulcers or rashes. No psoriasis.  Psychiatric: Alert and oriented x 3.  Recent memory intact, remote memory unclear.   Normal mood and affect. Well groomed.  Good eye contact.  Cardiovascular: overall no swelling, no varicosities, no edema bilaterally, normal temperatures of the legs and arms, no clubbing, cyanosis and good capillary refill.  Lymphatic: palpation is normal.  The left lower extremity is examined:  Inspection:  Thigh:  Non-tender and no defects  Knee has swelling 1+ effusion.                        Joint tenderness is present                        Patient is tender over the medial joint line  Lower Leg:  Has normal appearance and no tenderness or defects  Ankle:  Non-tender and no defects  Foot:  Non-tender and no defects Range of Motion:  Knee:  Range of motion is: 0-105                        Crepitus is  present  Ankle:  Range of motion is normal. Strength and Tone:  The left lower extremity has normal strength and tone. Stability:  Knee:  The knee is stable.  Ankle:  The ankle is stable.  Right knee negative.  The patient has been educated about the nature of the problem(s) and counseled on treatment options.  The patient appeared to understand what I have discussed and is in agreement with it.  Encounter Diagnoses  Name Primary?  . Left knee pain Yes  . Essential hypertension   . Complete heart block [facility])     PLAN Call if any problems.  Precautions discussed.  Continue current medications.   Return to clinic after getting the Synvisc.

## 2016-04-06 ENCOUNTER — Encounter (HOSPITAL_BASED_OUTPATIENT_CLINIC_OR_DEPARTMENT_OTHER): Payer: PPO | Attending: Internal Medicine

## 2016-04-06 DIAGNOSIS — E11610 Type 2 diabetes mellitus with diabetic neuropathic arthropathy: Secondary | ICD-10-CM | POA: Diagnosis not present

## 2016-04-06 DIAGNOSIS — E1121 Type 2 diabetes mellitus with diabetic nephropathy: Secondary | ICD-10-CM | POA: Insufficient documentation

## 2016-04-06 DIAGNOSIS — E1143 Type 2 diabetes mellitus with diabetic autonomic (poly)neuropathy: Secondary | ICD-10-CM | POA: Diagnosis not present

## 2016-04-06 DIAGNOSIS — Z89431 Acquired absence of right foot: Secondary | ICD-10-CM | POA: Diagnosis not present

## 2016-04-06 DIAGNOSIS — M86471 Chronic osteomyelitis with draining sinus, right ankle and foot: Secondary | ICD-10-CM | POA: Diagnosis not present

## 2016-04-06 DIAGNOSIS — E11621 Type 2 diabetes mellitus with foot ulcer: Secondary | ICD-10-CM | POA: Insufficient documentation

## 2016-04-06 DIAGNOSIS — L97511 Non-pressure chronic ulcer of other part of right foot limited to breakdown of skin: Secondary | ICD-10-CM | POA: Diagnosis not present

## 2016-04-06 DIAGNOSIS — M86671 Other chronic osteomyelitis, right ankle and foot: Secondary | ICD-10-CM | POA: Diagnosis not present

## 2016-04-07 DIAGNOSIS — L97509 Non-pressure chronic ulcer of other part of unspecified foot with unspecified severity: Secondary | ICD-10-CM | POA: Diagnosis not present

## 2016-04-14 IMAGING — CR DG FOOT COMPLETE 3+V*R*
3 series · 3 of 3 positions shown · non-contrast
Comparison: 02/07/2011

CLINICAL DATA: Known diabetic ulcer on foot with pain

EXAM:
RIGHT FOOT COMPLETE - 3+ VIEW

[AP]
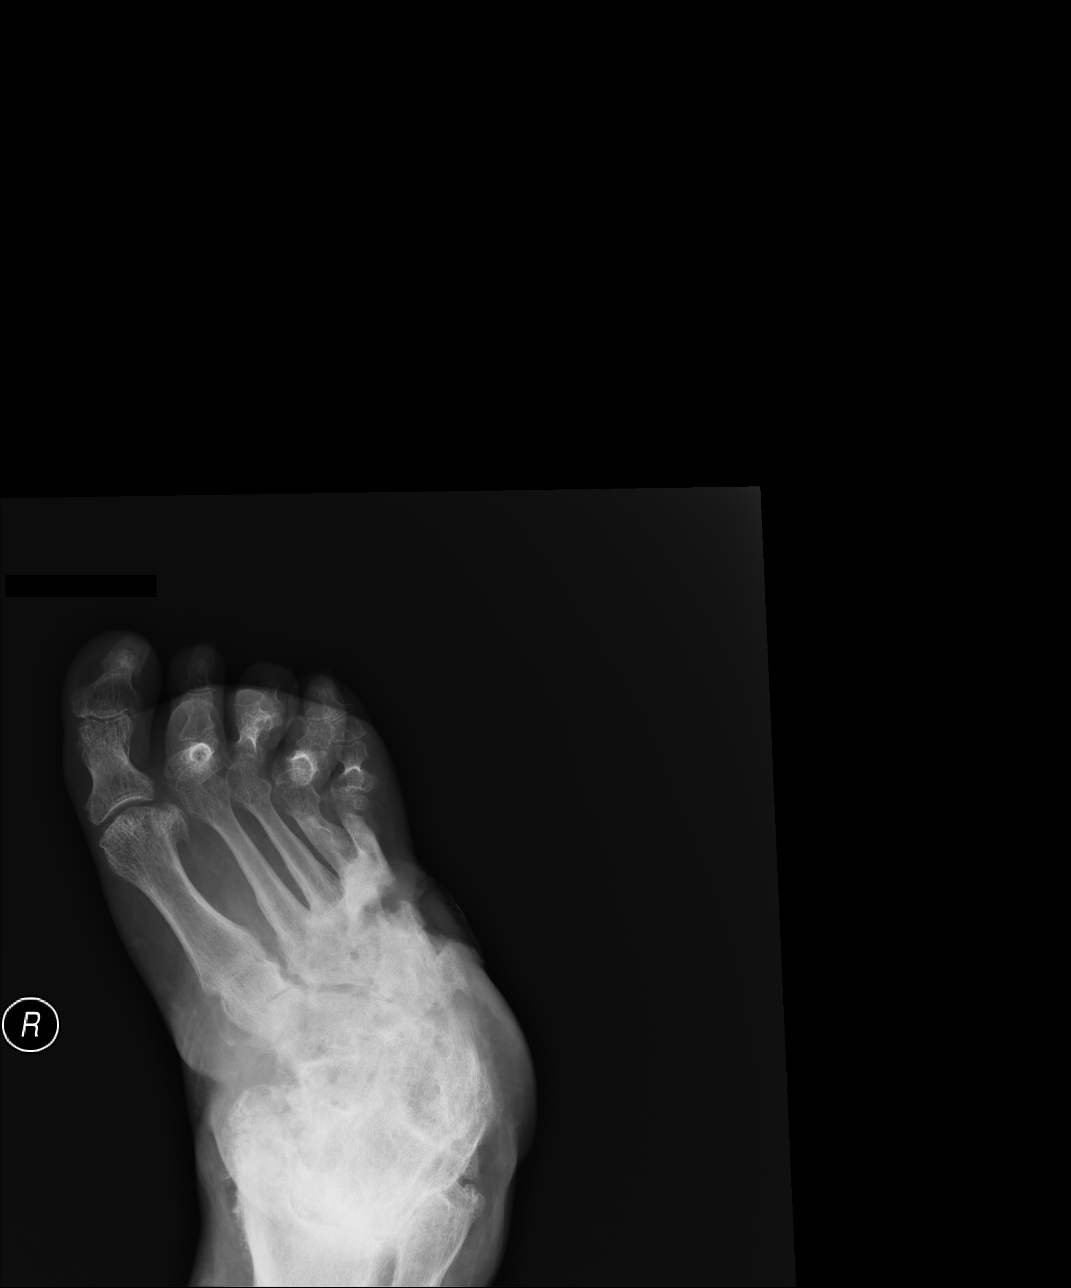

[ap obl int rot]
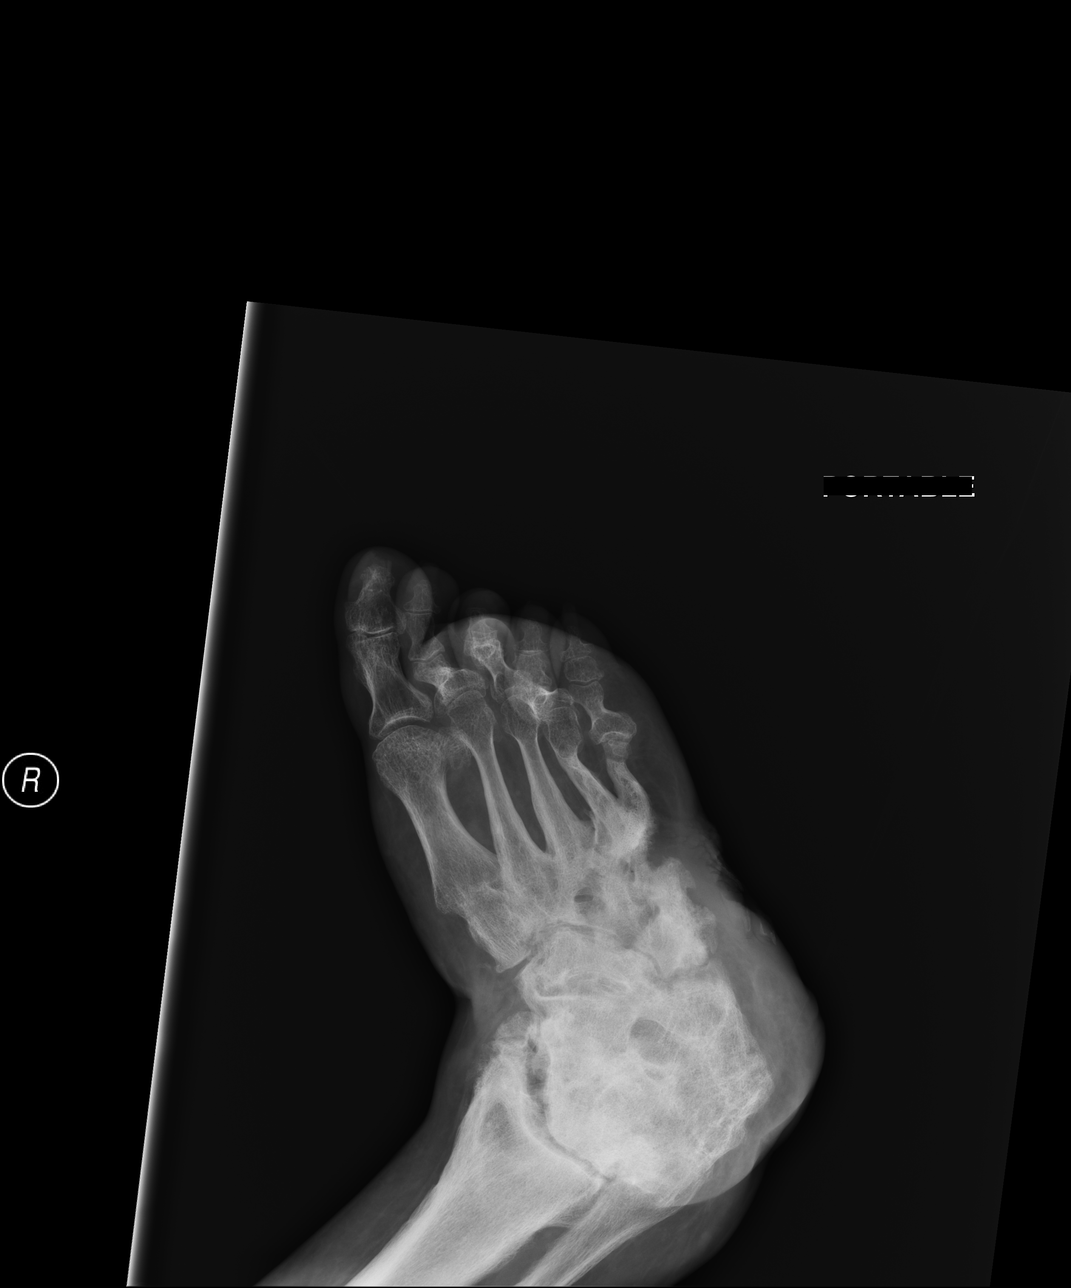

[lateral]
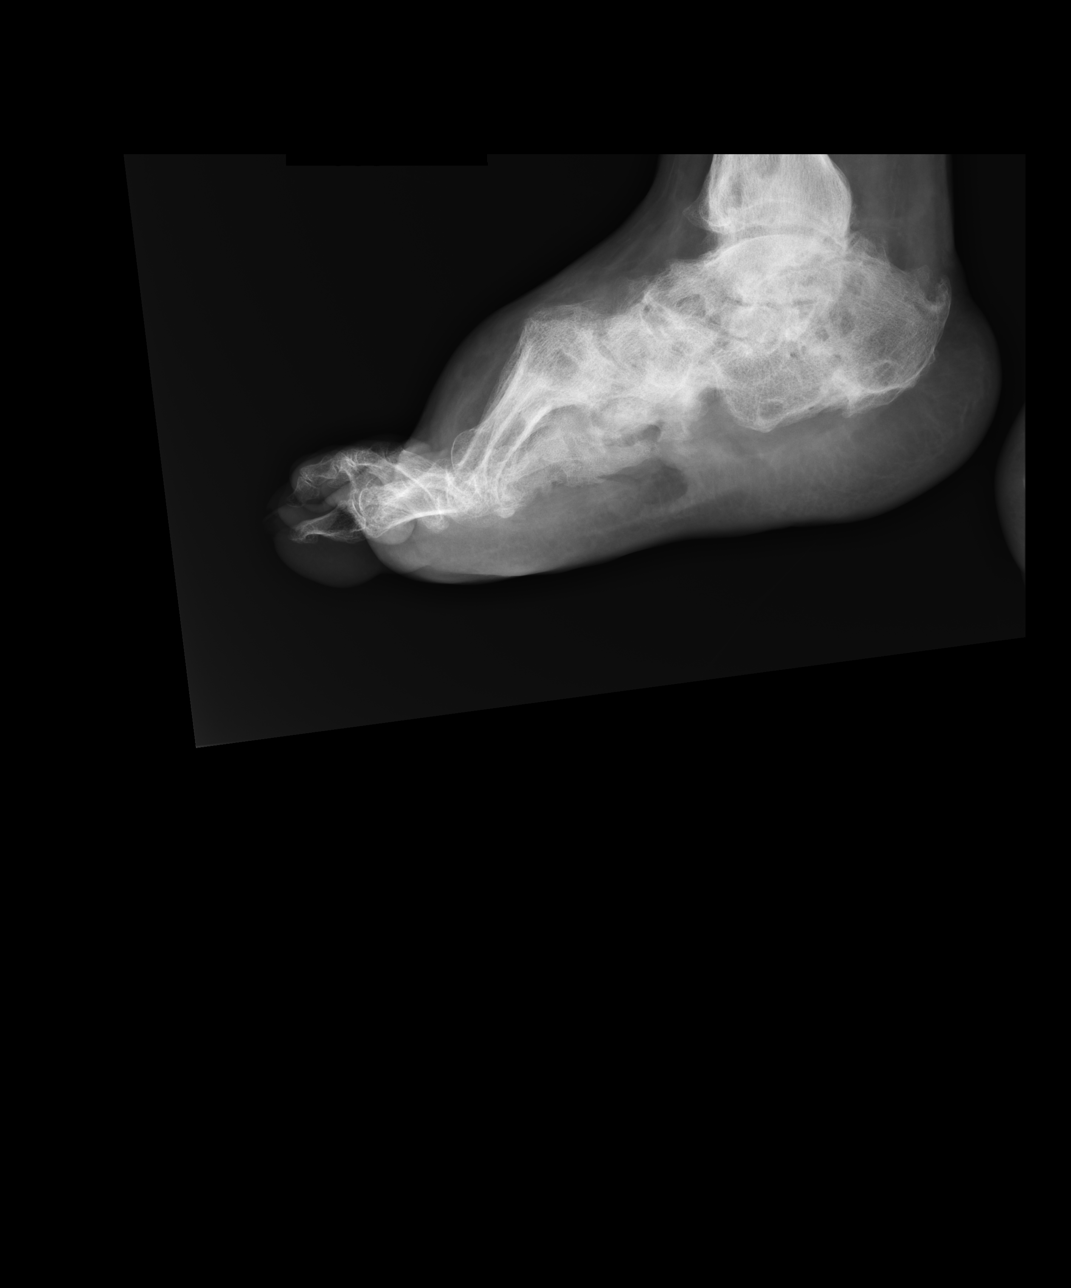

[3 of 3 positions shown; findings below may reference images not displayed]

FINDINGS: Significant bony changes are noted consistent with the given
clinical history with diabetic foot and multiple Charcot joints.
These changes have progressed in the interval from the prior exam.
No definitive bony erosion to suggest osteomyelitis is noted.
IMPRESSION: Severe changes in the ankle and foot secondary to diabetic
neuropathy and Charcot joint. No definitive erosion to suggest
osteomyelitis is noted. MR would be more sensitive as clinically
indicated.

## 2016-04-14 IMAGING — CT CT HEAD W/O CM
1 series · 15 of 30 positions shown, 19 images · non-contrast
Comparison: None.

CLINICAL DATA: Found on floor, with incontinence. Question of
seizure. Bradycardia. Initial encounter.

EXAM:
CT HEAD WITHOUT CONTRAST
TECHNIQUE: Contiguous axial images were obtained from the base of the skull
through the vertex without intravenous contrast.

[Series 2: head 5.0 h30s · axial · 0.44mm/px · z∈[+1210,+1355]mm · 15 of 33 slices shown, 19 images]
[im 2/33  brain]
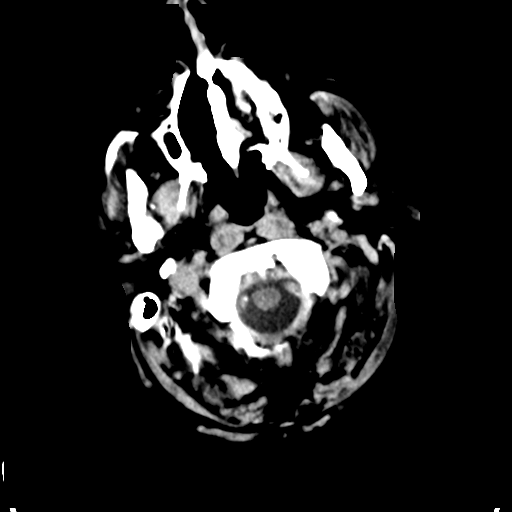
[im 2/33  bone]
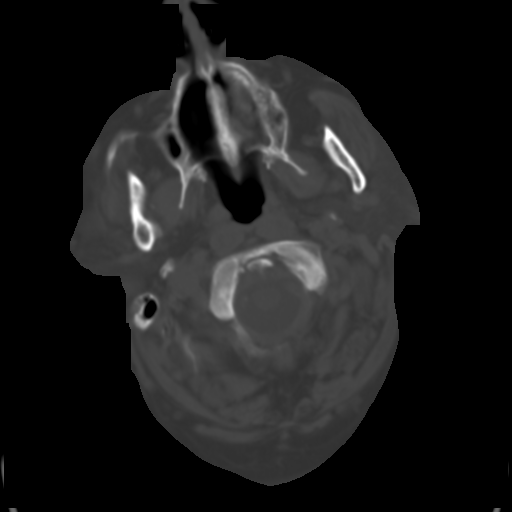
[im 4/33  brain]
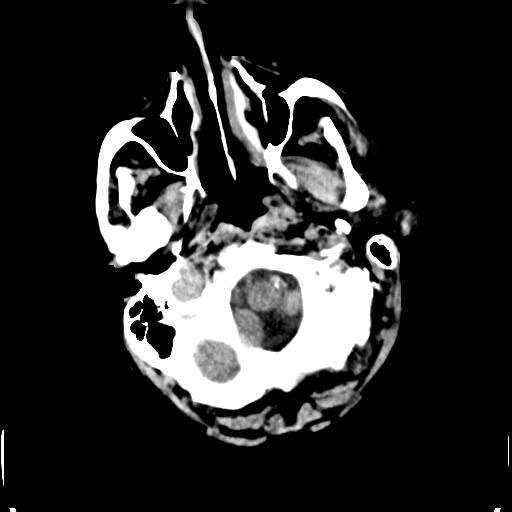
[im 6/33  brain]
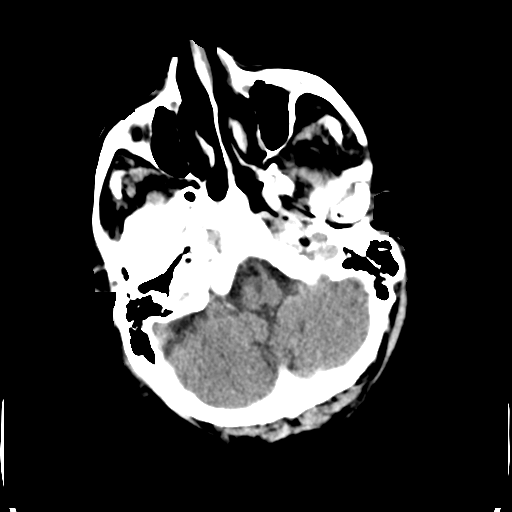
[im 8/33  brain]
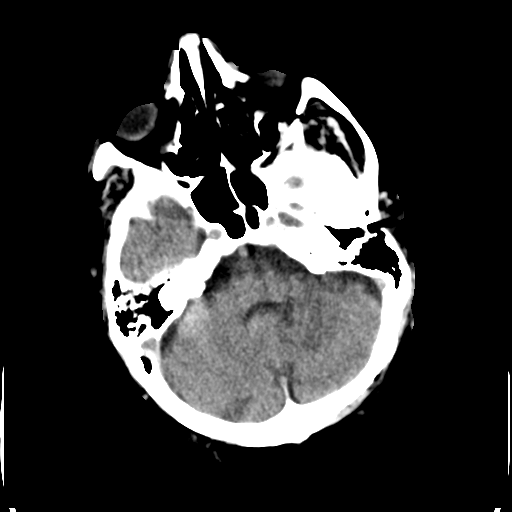
[im 10/33  brain]
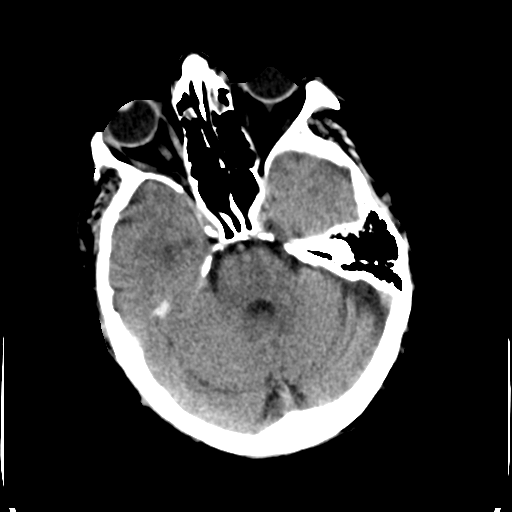
[im 10/33  bone]
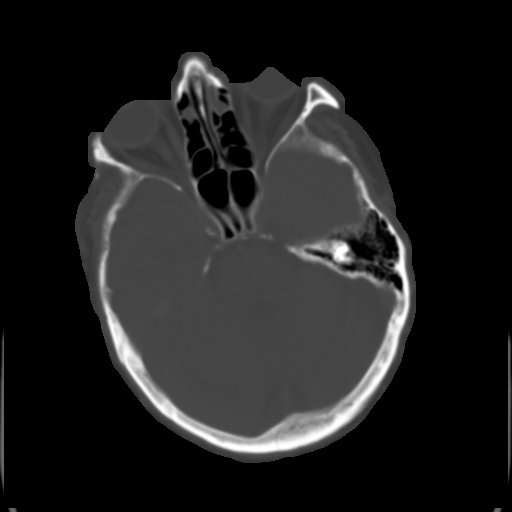
[im 13/33  brain]
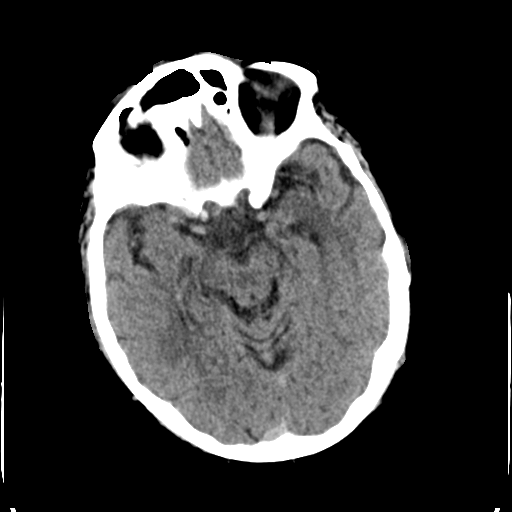
[im 15/33  brain]
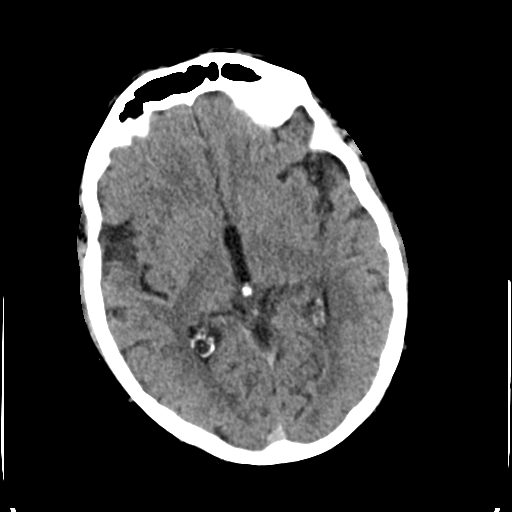
[im 17/33  brain]
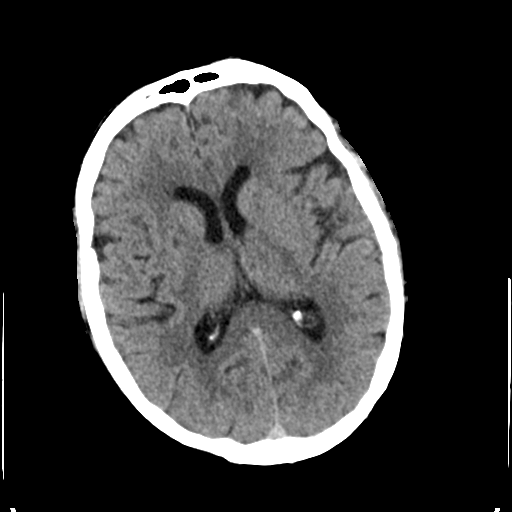
[im 18/33  brain]
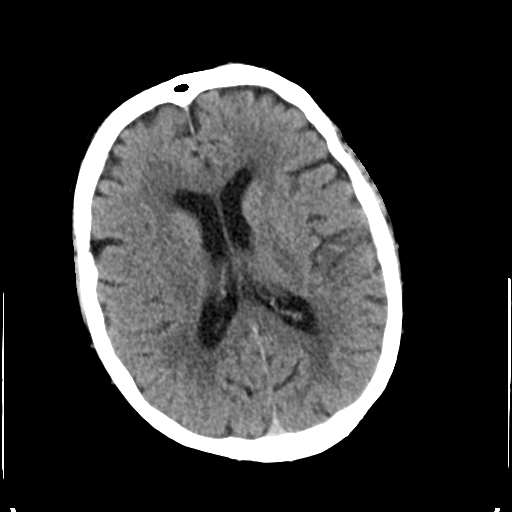
[im 18/33  bone]
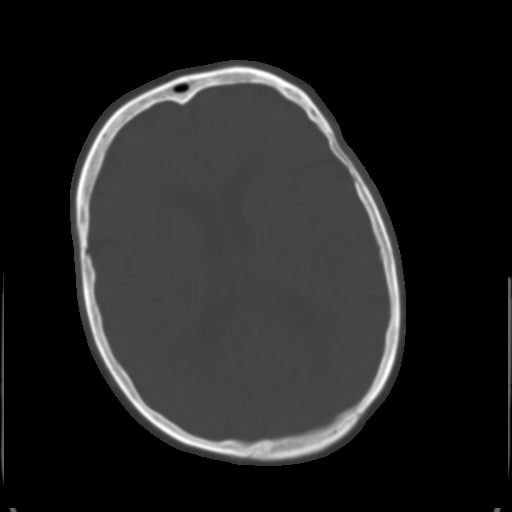
[im 20/33  brain]
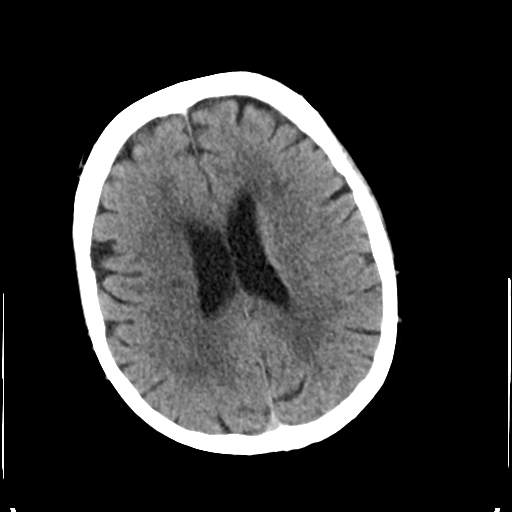
[im 23/33  brain]
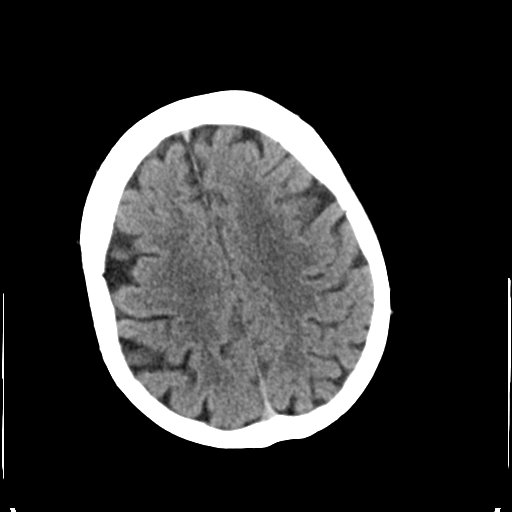
[im 25/33  brain]
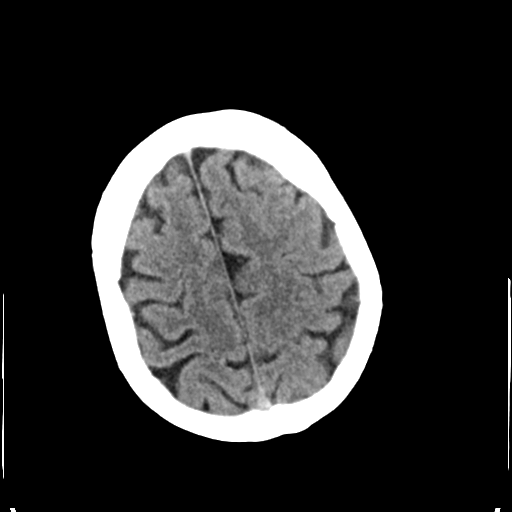
[im 27/33  brain]
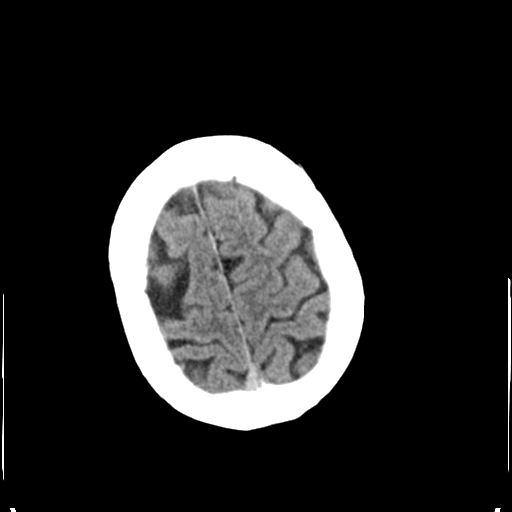
[im 27/33  bone]
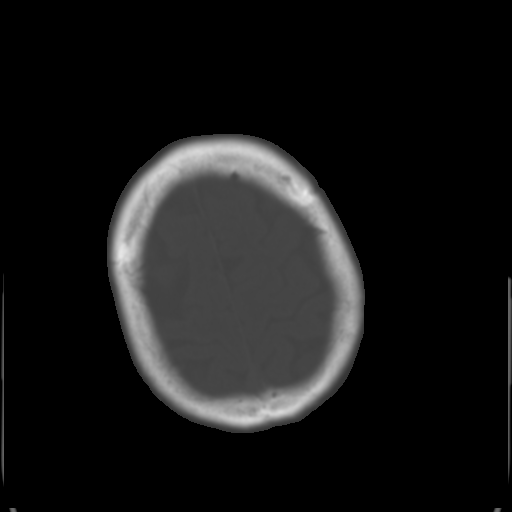
[im 29/33  brain]
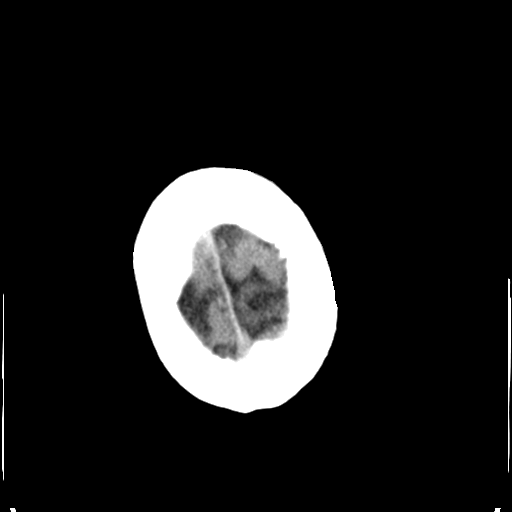
[im 31/33  brain]
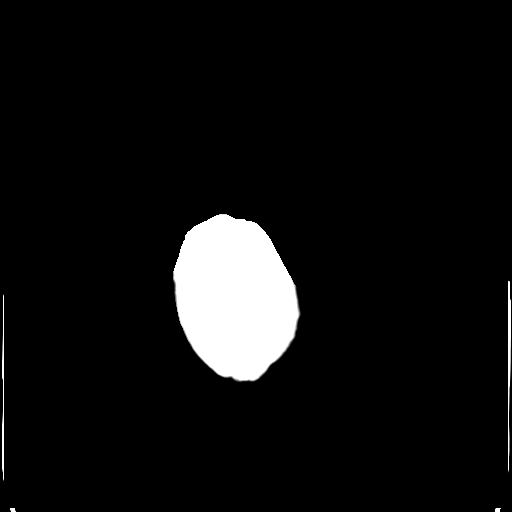

[15 of 30 positions shown; findings below may reference images not displayed]

FINDINGS: There is no evidence of acute infarction, mass lesion, or intra- or
extra-axial hemorrhage on CT.

Prominence of the ventricles and sulci suggests mild cortical loss.
Cerebellar atrophy is noted. Mild periventricular and subcortical
white matter change likely reflects small vessel ischemic
microangiopathy. A small chronic lacunar infarct is seen at the
right mid corona radiata. A small chronic lacunar infarct is also
seen at the right basal ganglia.

The brainstem and fourth ventricle are within normal limits. The
cerebral hemispheres demonstrate grossly normal gray-white
differentiation. No mass effect or midline shift is seen.

There is no evidence of fracture; visualized osseous structures are
unremarkable in appearance. The visualized portions of the orbits
are within normal limits. The paranasal sinuses and mastoid air
cells are well-aerated. No significant soft tissue abnormalities are
seen.
IMPRESSION: 1. No acute intracranial pathology seen on CT.
2. Mild cortical volume loss and scattered small vessel ischemic
microangiopathy.
3. Small chronic lacunar infarcts at the right mid corona radiata
and right basal ganglia.

## 2016-05-05 ENCOUNTER — Encounter: Payer: PPO | Admitting: Internal Medicine

## 2016-05-12 DIAGNOSIS — M14671 Charcot's joint, right ankle and foot: Secondary | ICD-10-CM | POA: Diagnosis not present

## 2016-05-23 DIAGNOSIS — L97509 Non-pressure chronic ulcer of other part of unspecified foot with unspecified severity: Secondary | ICD-10-CM | POA: Diagnosis not present

## 2016-05-26 ENCOUNTER — Encounter: Payer: Self-pay | Admitting: Internal Medicine

## 2016-05-26 ENCOUNTER — Ambulatory Visit (INDEPENDENT_AMBULATORY_CARE_PROVIDER_SITE_OTHER): Payer: PPO | Admitting: Internal Medicine

## 2016-05-26 DIAGNOSIS — R001 Bradycardia, unspecified: Secondary | ICD-10-CM | POA: Diagnosis not present

## 2016-05-26 DIAGNOSIS — I442 Atrioventricular block, complete: Secondary | ICD-10-CM | POA: Diagnosis not present

## 2016-05-26 LAB — CUP PACEART INCLINIC DEVICE CHECK
Battery Remaining Longevity: 106 mo
Battery Voltage: 3.02 V
Brady Statistic RV Percent Paced: 75.51 %
Date Time Interrogation Session: 20170728130831
Implantable Lead Implant Date: 20160327
Implantable Lead Location: 753860
Implantable Lead Model: 5076
Lead Channel Impedance Value: 513 Ohm
Lead Channel Impedance Value: 646 Ohm
Lead Channel Pacing Threshold Amplitude: 0.75 V
Lead Channel Pacing Threshold Pulse Width: 0.4 ms
Lead Channel Setting Pacing Amplitude: 2.5 V
Lead Channel Setting Pacing Pulse Width: 0.4 ms
Lead Channel Setting Sensing Sensitivity: 0.9 mV

## 2016-05-26 MED ORDER — LISINOPRIL 20 MG PO TABS: 20.0000 mg | ORAL_TABLET | Freq: Every day | ORAL | 3 refills | Status: DC

## 2016-05-26 MED ORDER — CARVEDILOL 12.5 MG PO TABS: 12.5000 mg | ORAL_TABLET | Freq: Two times a day (BID) | ORAL | 3 refills | Status: DC

## 2016-05-26 NOTE — Progress Notes (Signed)
Electrophysiology Office Note   Date:  05/26/2016   ID:  Nathan, Miller 10/02/1951, MRN [id number]  PCP:   Evalee Jefferson Victorino Dike Coulialard PA-C) Cardiologist:  Dr Antoine Poche Primary Electrophysiologist: Hillis Range, MD    Chief Complaint  Patient presents with  . Shortness of Breath     History of Present Illness: Nathan Miller is a 65 y.o. male who presents today for electrophysiology evaluation.  Since his last visit, he has done well.  No further syncope.  Energy is good.  He has stable SOB.  He mows his own lawn and plays golf without limitation.  He has R foot brace and struggles with a R foot wound/ osteomyelitis which are is primary concern. Today, he denies symptoms of palpitations, chest pain, orthopnea, PND, lower extremity edema, claudication,  bleeding, or neurologic sequela. The patient is tolerating medications without difficulties and is otherwise without complaint today.    Past Medical History:  Diagnosis Date  . CAD (coronary artery disease)    NSTEMI in 2001. Cardiac cath showed: LAD: 40% proximal, LCX: 70% mid, RCA thrombotic occlusion in mid segment. s/p PCI and 2 overlapped BMSs to RCA.   [name] CHF (congestive heart failure) (HCC)   . Diabetes mellitus   . Hypertension   . Osteomyelitis [facility])    Past Surgical History:  Procedure Laterality Date  . CARDIAC CATHETERIZATION    . CORONARY ANGIOPLASTY    . PERMANENT PACEMAKER INSERTION N/A 01/22/2015   MDT Advisa pacemaker implanted for complete heart block by Dr Nathan Miller  . TEMPORARY PACEMAKER INSERTION N/A 01/21/2015   Procedure: TEMPORARY PACEMAKER INSERTION;  Surgeon: Marykay Lex, MD;  Location: [facility] CATH LAB;  Service: Cardiovascular;  Laterality: N/A;     Current Outpatient Prescriptions  Medication Sig Dispense Refill  . aspirin EC 81 MG tablet Take 1 tablet (81 mg total) by mouth daily. 30 tablet 0  . atorvastatin (LIPITOR) 80 MG tablet Take 1 tablet by mouth daily.    . carvedilol (COREG) 6.25 MG  tablet Take 1 tablet (6.25 mg total) by mouth 2 (two) times daily with a meal. 60 tablet 0  . lisinopril (PRINIVIL,ZESTRIL) 10 MG tablet Take 1 tablet (10 mg total) by mouth daily. 30 tablet 0  . metFORMIN (GLUCOPHAGE) 500 MG tablet Take 1 tablet (500 mg total) by mouth 2 (two) times daily with a meal. 60 tablet 0  . nitroGLYCERIN (NITROSTAT) 0.4 MG SL tablet Place 1 tablet (0.4 mg total) under the tongue every 5 (five) minutes as needed for chest pain. 20 tablet 0   No current facility-administered medications for this visit.     Allergies:   Penicillins   Social History:  The patient  reports that he quit smoking about 8 years ago. His smoking use included Cigarettes. He started smoking about 47 years ago. He has a 12.00 pack-year smoking history. His smokeless tobacco use includes Snuff. He reports that he drinks about 25.2 oz of alcohol per week . He reports that he does not use drugs.   Family History:  The patient's family history includes COPD in his father; Heart disease in his father and paternal grandfather.    ROS:  Please see the history of present illness.   All other systems are reviewed and negative.    PHYSICAL EXAM: VS:  BP (!) 150/100   Pulse (!) 50   Ht 5\' 6"  (1.676 m)   Wt 230 lb (104.3 kg)   SpO2 98% Comment: on room air  BMI 37.12 kg/m  , BMI Body mass index is 37.12 kg/m. GEN: overweight, in no acute distress  HEENT: normal  Neck: no JVD, carotid bruits, or masses Cardiac: RRR; no murmurs, rubs, or gallops,no edema  Respiratory:  clear to auscultation bilaterally, normal work of breathing GI: soft, nontender, nondistended, + BS MS: R foot in a brace  Skin: warm and dry, device pocket is well healed Neuro:  Strength and sensation are intact Psych: euthymic mood, full affect  Device interrogation is reviewed today in detail.  See PaceArt for details.   Recent Labs: 06/13/2015: ALT 11 06/16/2015: Hemoglobin 10.8; Platelets 195 06/17/2015: Magnesium  1.8 06/18/2015: BUN <5; Creatinine, Ser 0.72; Potassium 4.0; Sodium 135    Lipid Panel  No results found for: CHOL, TRIG, HDL, CHOLHDL, VLDL, LDLCALC, LDLDIRECT   Wt Readings from Last 3 Encounters:  05/26/16 230 lb (104.3 kg)  03/28/16 226 lb (102.5 kg)  03/07/16 226 lb (102.5 kg)     ASSESSMENT AND PLAN:  1.  Complete heart block Device dependant today At time of implant, only a ventricular lead could be placed due to venous anatomy issues. He has done great with VVI pacing and currently we do not plan to upgrade his device.  Normal pacemaker function See Pace Art report No changes today  2. CAD No ischemic symptoms  3. htn Elevated Increase coreg 12.5mg  BId Increase lisinopril 20mg  daily  PCP to follow BMET with these changes  (pt advised to follow-up)   Follow-up: carelink Return to see me in 1 year  Current medicines are reviewed at length with the patient today.   The patient does not have concerns regarding his medicines.  The following changes were made today:  none  Signed, Hillis Range, MD  05/26/2016 9:15 AM     [facility] [address] [place] [address] [phone] (office) [phone] (fax)

## 2016-05-26 NOTE — Patient Instructions (Addendum)
Medication Instructions:   Your physician has recommended you make the following change in your medication:   Increase carvedilol to 12.5 mg twice daily. You may take (2) of your 6.25 mg tablets twice daily until they are finished.  Increase lisinopril to 20 mg daily. You may take (2) of your 10 mg tablets daily until they are finished.  Continue all other medications the same.  Labwork: NONE  Testing/Procedures:  Device check from home on 08/28/16.  Follow-Up: Your physician recommends that you schedule a follow-up appointment in: 1 year with Dr. Rayann Heman. Please schedule this appointment today before leaving the office.  Any Other Special Instructions Will Be Listed Below (If Applicable).  If you need a refill on your cardiac medications before your next appointment, please call your pharmacy.

## 2016-06-01 ENCOUNTER — Encounter: Payer: Self-pay | Admitting: Internal Medicine

## 2016-06-08 ENCOUNTER — Encounter (HOSPITAL_BASED_OUTPATIENT_CLINIC_OR_DEPARTMENT_OTHER): Payer: PPO | Attending: Internal Medicine

## 2016-06-08 DIAGNOSIS — E1169 Type 2 diabetes mellitus with other specified complication: Secondary | ICD-10-CM | POA: Diagnosis not present

## 2016-06-08 DIAGNOSIS — E1121 Type 2 diabetes mellitus with diabetic nephropathy: Secondary | ICD-10-CM | POA: Insufficient documentation

## 2016-06-08 DIAGNOSIS — G9009 Other idiopathic peripheral autonomic neuropathy: Secondary | ICD-10-CM | POA: Insufficient documentation

## 2016-06-08 DIAGNOSIS — M86471 Chronic osteomyelitis with draining sinus, right ankle and foot: Secondary | ICD-10-CM | POA: Diagnosis not present

## 2016-06-08 DIAGNOSIS — L97511 Non-pressure chronic ulcer of other part of right foot limited to breakdown of skin: Secondary | ICD-10-CM | POA: Insufficient documentation

## 2016-06-08 DIAGNOSIS — X35XXXA Volcanic eruption, initial encounter: Secondary | ICD-10-CM | POA: Diagnosis not present

## 2016-06-08 DIAGNOSIS — M86671 Other chronic osteomyelitis, right ankle and foot: Secondary | ICD-10-CM | POA: Diagnosis not present

## 2016-06-19 DIAGNOSIS — L97509 Non-pressure chronic ulcer of other part of unspecified foot with unspecified severity: Secondary | ICD-10-CM | POA: Diagnosis not present

## 2016-07-27 ENCOUNTER — Encounter (HOSPITAL_BASED_OUTPATIENT_CLINIC_OR_DEPARTMENT_OTHER): Payer: PPO

## 2016-08-03 ENCOUNTER — Encounter (HOSPITAL_BASED_OUTPATIENT_CLINIC_OR_DEPARTMENT_OTHER): Payer: PPO | Attending: Internal Medicine

## 2016-08-03 DIAGNOSIS — G9009 Other idiopathic peripheral autonomic neuropathy: Secondary | ICD-10-CM | POA: Diagnosis not present

## 2016-08-03 DIAGNOSIS — M86471 Chronic osteomyelitis with draining sinus, right ankle and foot: Secondary | ICD-10-CM | POA: Diagnosis not present

## 2016-08-03 DIAGNOSIS — E1169 Type 2 diabetes mellitus with other specified complication: Secondary | ICD-10-CM | POA: Diagnosis not present

## 2016-08-03 DIAGNOSIS — E1121 Type 2 diabetes mellitus with diabetic nephropathy: Secondary | ICD-10-CM | POA: Insufficient documentation

## 2016-08-03 DIAGNOSIS — M86671 Other chronic osteomyelitis, right ankle and foot: Secondary | ICD-10-CM | POA: Diagnosis not present

## 2016-08-03 DIAGNOSIS — Z89431 Acquired absence of right foot: Secondary | ICD-10-CM | POA: Diagnosis not present

## 2016-08-03 DIAGNOSIS — L97512 Non-pressure chronic ulcer of other part of right foot with fat layer exposed: Secondary | ICD-10-CM | POA: Diagnosis not present

## 2016-08-04 DIAGNOSIS — E782 Mixed hyperlipidemia: Secondary | ICD-10-CM | POA: Diagnosis not present

## 2016-08-04 DIAGNOSIS — Z6836 Body mass index (BMI) 36.0-36.9, adult: Secondary | ICD-10-CM | POA: Diagnosis not present

## 2016-08-04 DIAGNOSIS — E11622 Type 2 diabetes mellitus with other skin ulcer: Secondary | ICD-10-CM | POA: Diagnosis not present

## 2016-08-04 DIAGNOSIS — T8189XA Other complications of procedures, not elsewhere classified, initial encounter: Secondary | ICD-10-CM | POA: Diagnosis not present

## 2016-08-04 DIAGNOSIS — E6609 Other obesity due to excess calories: Secondary | ICD-10-CM | POA: Diagnosis not present

## 2016-08-04 DIAGNOSIS — I10 Essential (primary) hypertension: Secondary | ICD-10-CM | POA: Diagnosis not present

## 2016-08-04 DIAGNOSIS — I25119 Atherosclerotic heart disease of native coronary artery with unspecified angina pectoris: Secondary | ICD-10-CM | POA: Diagnosis not present

## 2016-08-04 DIAGNOSIS — I442 Atrioventricular block, complete: Secondary | ICD-10-CM | POA: Diagnosis not present

## 2016-08-28 ENCOUNTER — Ambulatory Visit (INDEPENDENT_AMBULATORY_CARE_PROVIDER_SITE_OTHER): Payer: PPO | Admitting: *Deleted

## 2016-08-28 ENCOUNTER — Telehealth: Payer: Self-pay | Admitting: Cardiology

## 2016-08-28 DIAGNOSIS — I442 Atrioventricular block, complete: Secondary | ICD-10-CM

## 2016-08-28 NOTE — Progress Notes (Signed)
Remote pacemaker transmission.   

## 2016-08-28 NOTE — Telephone Encounter (Signed)
LMOVM reminding pt to send remote transmission.   

## 2016-09-01 ENCOUNTER — Encounter: Payer: Self-pay | Admitting: Cardiology

## 2016-09-14 ENCOUNTER — Encounter (HOSPITAL_BASED_OUTPATIENT_CLINIC_OR_DEPARTMENT_OTHER): Payer: PPO | Attending: Internal Medicine

## 2016-09-14 DIAGNOSIS — L97511 Non-pressure chronic ulcer of other part of right foot limited to breakdown of skin: Secondary | ICD-10-CM | POA: Diagnosis not present

## 2016-09-14 DIAGNOSIS — Z89431 Acquired absence of right foot: Secondary | ICD-10-CM | POA: Insufficient documentation

## 2016-09-14 DIAGNOSIS — T8189XA Other complications of procedures, not elsewhere classified, initial encounter: Secondary | ICD-10-CM | POA: Diagnosis not present

## 2016-09-14 DIAGNOSIS — L97512 Non-pressure chronic ulcer of other part of right foot with fat layer exposed: Secondary | ICD-10-CM | POA: Diagnosis not present

## 2016-09-14 DIAGNOSIS — M86471 Chronic osteomyelitis with draining sinus, right ankle and foot: Secondary | ICD-10-CM | POA: Insufficient documentation

## 2016-10-26 ENCOUNTER — Encounter (HOSPITAL_BASED_OUTPATIENT_CLINIC_OR_DEPARTMENT_OTHER): Payer: PPO | Attending: Internal Medicine

## 2016-10-26 DIAGNOSIS — M86471 Chronic osteomyelitis with draining sinus, right ankle and foot: Secondary | ICD-10-CM | POA: Diagnosis not present

## 2016-10-26 DIAGNOSIS — Z89421 Acquired absence of other right toe(s): Secondary | ICD-10-CM | POA: Insufficient documentation

## 2016-10-26 DIAGNOSIS — T8189XA Other complications of procedures, not elsewhere classified, initial encounter: Secondary | ICD-10-CM | POA: Diagnosis not present

## 2016-10-26 DIAGNOSIS — L97512 Non-pressure chronic ulcer of other part of right foot with fat layer exposed: Secondary | ICD-10-CM | POA: Diagnosis not present

## 2016-10-26 DIAGNOSIS — G9009 Other idiopathic peripheral autonomic neuropathy: Secondary | ICD-10-CM | POA: Diagnosis not present

## 2016-10-26 DIAGNOSIS — L97511 Non-pressure chronic ulcer of other part of right foot limited to breakdown of skin: Secondary | ICD-10-CM | POA: Insufficient documentation

## 2016-11-27 ENCOUNTER — Ambulatory Visit (INDEPENDENT_AMBULATORY_CARE_PROVIDER_SITE_OTHER): Payer: PPO | Admitting: *Deleted

## 2016-11-27 ENCOUNTER — Telehealth: Payer: Self-pay | Admitting: Cardiology

## 2016-11-27 DIAGNOSIS — I442 Atrioventricular block, complete: Secondary | ICD-10-CM

## 2016-11-27 LAB — CUP PACEART REMOTE DEVICE CHECK
Battery Remaining Longevity: 102 mo
Battery Voltage: 3.02 V
Brady Statistic RV Percent Paced: 99.8 %
Date Time Interrogation Session: 20180202112728
Implantable Lead Implant Date: 20160327
Implantable Lead Location: 753860
Implantable Lead Model: 5076
Implantable Pulse Generator Implant Date: 20160327
Lead Channel Impedance Value: 589 Ohm
Lead Channel Pacing Threshold Amplitude: 0.625 V
Lead Channel Pacing Threshold Pulse Width: 0.4 ms
Lead Channel Sensing Intrinsic Amplitude: 20 mV

## 2016-11-27 NOTE — Telephone Encounter (Signed)
LMOVM reminding pt to send remote transmission.   

## 2016-11-28 ENCOUNTER — Telehealth: Payer: Self-pay | Admitting: Internal Medicine

## 2016-11-28 ENCOUNTER — Telehealth: Payer: Self-pay | Admitting: Cardiology

## 2016-11-28 ENCOUNTER — Encounter: Payer: Self-pay | Admitting: Cardiology

## 2016-11-28 NOTE — Telephone Encounter (Signed)
Informed pt that we did receive his remote transmission. Pt verbalized understanding.  

## 2016-11-28 NOTE — Telephone Encounter (Signed)
New Message: ° ° ° °Pt wants to know if you received his transmission. °

## 2016-11-28 NOTE — Telephone Encounter (Signed)
Returned pts call and advised him that the Nitro that was sent in 12/2014 is most definitely expired.  Pt will have his pharmacy send in a faxed refill request.  Pt verbalized appreciation for the call back.

## 2016-11-28 NOTE — Progress Notes (Signed)
Remote pacemaker transmission.   

## 2016-11-28 NOTE — Telephone Encounter (Signed)
New Message:   Pt wants to know how long is Nitroglycerin good for? He said he have his for 2 yrs/

## 2016-12-07 ENCOUNTER — Encounter (HOSPITAL_BASED_OUTPATIENT_CLINIC_OR_DEPARTMENT_OTHER): Payer: PPO | Attending: Internal Medicine

## 2016-12-07 DIAGNOSIS — M86471 Chronic osteomyelitis with draining sinus, right ankle and foot: Secondary | ICD-10-CM | POA: Diagnosis not present

## 2016-12-07 DIAGNOSIS — E1121 Type 2 diabetes mellitus with diabetic nephropathy: Secondary | ICD-10-CM | POA: Diagnosis not present

## 2016-12-07 DIAGNOSIS — G9009 Other idiopathic peripheral autonomic neuropathy: Secondary | ICD-10-CM | POA: Diagnosis not present

## 2016-12-07 DIAGNOSIS — L97512 Non-pressure chronic ulcer of other part of right foot with fat layer exposed: Secondary | ICD-10-CM | POA: Insufficient documentation

## 2016-12-07 DIAGNOSIS — E11610 Type 2 diabetes mellitus with diabetic neuropathic arthropathy: Secondary | ICD-10-CM | POA: Diagnosis not present

## 2016-12-07 DIAGNOSIS — Z89431 Acquired absence of right foot: Secondary | ICD-10-CM | POA: Diagnosis not present

## 2016-12-07 DIAGNOSIS — E1151 Type 2 diabetes mellitus with diabetic peripheral angiopathy without gangrene: Secondary | ICD-10-CM | POA: Insufficient documentation

## 2016-12-07 DIAGNOSIS — M8668 Other chronic osteomyelitis, other site: Secondary | ICD-10-CM | POA: Diagnosis not present

## 2016-12-11 DIAGNOSIS — L97509 Non-pressure chronic ulcer of other part of unspecified foot with unspecified severity: Secondary | ICD-10-CM | POA: Diagnosis not present

## 2017-01-18 ENCOUNTER — Encounter (HOSPITAL_BASED_OUTPATIENT_CLINIC_OR_DEPARTMENT_OTHER): Payer: PPO | Attending: Internal Medicine

## 2017-01-18 DIAGNOSIS — I252 Old myocardial infarction: Secondary | ICD-10-CM | POA: Insufficient documentation

## 2017-01-18 DIAGNOSIS — L97512 Non-pressure chronic ulcer of other part of right foot with fat layer exposed: Secondary | ICD-10-CM | POA: Insufficient documentation

## 2017-01-18 DIAGNOSIS — G629 Polyneuropathy, unspecified: Secondary | ICD-10-CM | POA: Insufficient documentation

## 2017-01-18 DIAGNOSIS — Z87891 Personal history of nicotine dependence: Secondary | ICD-10-CM | POA: Insufficient documentation

## 2017-01-25 DIAGNOSIS — Z87891 Personal history of nicotine dependence: Secondary | ICD-10-CM | POA: Diagnosis not present

## 2017-01-25 DIAGNOSIS — L97512 Non-pressure chronic ulcer of other part of right foot with fat layer exposed: Secondary | ICD-10-CM | POA: Diagnosis not present

## 2017-01-25 DIAGNOSIS — I252 Old myocardial infarction: Secondary | ICD-10-CM | POA: Diagnosis not present

## 2017-01-25 DIAGNOSIS — G629 Polyneuropathy, unspecified: Secondary | ICD-10-CM | POA: Diagnosis not present

## 2017-01-25 DIAGNOSIS — E11621 Type 2 diabetes mellitus with foot ulcer: Secondary | ICD-10-CM | POA: Diagnosis not present

## 2017-01-26 DIAGNOSIS — L97509 Non-pressure chronic ulcer of other part of unspecified foot with unspecified severity: Secondary | ICD-10-CM | POA: Diagnosis not present

## 2017-01-26 DIAGNOSIS — L97512 Non-pressure chronic ulcer of other part of right foot with fat layer exposed: Secondary | ICD-10-CM | POA: Diagnosis not present

## 2017-01-31 ENCOUNTER — Ambulatory Visit
Admission: RE | Admit: 2017-01-31 | Discharge: 2017-01-31 | Disposition: A | Discharge status: Home / Self Care | Payer: PPO | Source: Ambulatory Visit | Attending: Internal Medicine | Admitting: Internal Medicine

## 2017-01-31 ENCOUNTER — Other Ambulatory Visit: Payer: Self-pay | Admitting: Internal Medicine

## 2017-01-31 DIAGNOSIS — L97519 Non-pressure chronic ulcer of other part of right foot with unspecified severity: Principal | ICD-10-CM

## 2017-01-31 DIAGNOSIS — M19071 Primary osteoarthritis, right ankle and foot: Secondary | ICD-10-CM | POA: Diagnosis not present

## 2017-01-31 DIAGNOSIS — E11621 Type 2 diabetes mellitus with foot ulcer: Secondary | ICD-10-CM

## 2017-02-06 DIAGNOSIS — I442 Atrioventricular block, complete: Secondary | ICD-10-CM | POA: Diagnosis not present

## 2017-02-06 DIAGNOSIS — E11622 Type 2 diabetes mellitus with other skin ulcer: Secondary | ICD-10-CM | POA: Diagnosis not present

## 2017-02-06 DIAGNOSIS — I10 Essential (primary) hypertension: Secondary | ICD-10-CM | POA: Diagnosis not present

## 2017-02-06 DIAGNOSIS — I25119 Atherosclerotic heart disease of native coronary artery with unspecified angina pectoris: Secondary | ICD-10-CM | POA: Diagnosis not present

## 2017-02-06 DIAGNOSIS — E782 Mixed hyperlipidemia: Secondary | ICD-10-CM | POA: Diagnosis not present

## 2017-02-06 DIAGNOSIS — Z6837 Body mass index (BMI) 37.0-37.9, adult: Secondary | ICD-10-CM | POA: Diagnosis not present

## 2017-02-08 ENCOUNTER — Encounter (HOSPITAL_BASED_OUTPATIENT_CLINIC_OR_DEPARTMENT_OTHER): Payer: PPO | Attending: Internal Medicine

## 2017-02-08 DIAGNOSIS — E11610 Type 2 diabetes mellitus with diabetic neuropathic arthropathy: Secondary | ICD-10-CM | POA: Insufficient documentation

## 2017-02-08 DIAGNOSIS — Z87891 Personal history of nicotine dependence: Secondary | ICD-10-CM | POA: Insufficient documentation

## 2017-02-08 DIAGNOSIS — L97512 Non-pressure chronic ulcer of other part of right foot with fat layer exposed: Secondary | ICD-10-CM | POA: Insufficient documentation

## 2017-02-15 DIAGNOSIS — M8668 Other chronic osteomyelitis, other site: Secondary | ICD-10-CM | POA: Diagnosis not present

## 2017-02-15 DIAGNOSIS — S91301A Unspecified open wound, right foot, initial encounter: Secondary | ICD-10-CM | POA: Diagnosis not present

## 2017-02-15 DIAGNOSIS — Z87891 Personal history of nicotine dependence: Secondary | ICD-10-CM | POA: Diagnosis not present

## 2017-02-15 DIAGNOSIS — E11610 Type 2 diabetes mellitus with diabetic neuropathic arthropathy: Secondary | ICD-10-CM | POA: Diagnosis not present

## 2017-02-15 DIAGNOSIS — L97512 Non-pressure chronic ulcer of other part of right foot with fat layer exposed: Secondary | ICD-10-CM | POA: Diagnosis not present

## 2017-02-19 ENCOUNTER — Other Ambulatory Visit: Payer: Self-pay | Admitting: Internal Medicine

## 2017-02-19 DIAGNOSIS — L97512 Non-pressure chronic ulcer of other part of right foot with fat layer exposed: Secondary | ICD-10-CM

## 2017-02-26 ENCOUNTER — Telehealth: Payer: Self-pay | Admitting: Cardiology

## 2017-02-26 ENCOUNTER — Ambulatory Visit (INDEPENDENT_AMBULATORY_CARE_PROVIDER_SITE_OTHER): Payer: PPO | Admitting: *Deleted

## 2017-02-26 DIAGNOSIS — I442 Atrioventricular block, complete: Secondary | ICD-10-CM | POA: Diagnosis not present

## 2017-02-26 NOTE — Telephone Encounter (Signed)
Confirmed remote transmission w/ pt wife.   

## 2017-02-26 NOTE — Progress Notes (Signed)
Remote pacemaker transmission.   

## 2017-02-27 ENCOUNTER — Encounter: Payer: Self-pay | Admitting: Cardiology

## 2017-02-27 LAB — CUP PACEART REMOTE DEVICE CHECK
Brady Statistic RV Percent Paced: 99.4 %
Date Time Interrogation Session: 20180501171155
Implantable Lead Implant Date: 20160327
Implantable Lead Location: 753860
Implantable Lead Model: 5076
Implantable Pulse Generator Implant Date: 20160327
Lead Channel Impedance Value: 551 Ohm
Lead Channel Pacing Threshold Amplitude: 0.625 V
Lead Channel Pacing Threshold Pulse Width: 0.4 ms
Lead Channel Sensing Intrinsic Amplitude: 20 mV

## 2017-03-01 ENCOUNTER — Encounter (HOSPITAL_BASED_OUTPATIENT_CLINIC_OR_DEPARTMENT_OTHER): Payer: PPO | Attending: Internal Medicine

## 2017-03-01 DIAGNOSIS — L97512 Non-pressure chronic ulcer of other part of right foot with fat layer exposed: Secondary | ICD-10-CM | POA: Insufficient documentation

## 2017-03-01 DIAGNOSIS — M86471 Chronic osteomyelitis with draining sinus, right ankle and foot: Secondary | ICD-10-CM | POA: Insufficient documentation

## 2017-03-01 DIAGNOSIS — E1121 Type 2 diabetes mellitus with diabetic nephropathy: Secondary | ICD-10-CM | POA: Insufficient documentation

## 2017-03-01 DIAGNOSIS — G9009 Other idiopathic peripheral autonomic neuropathy: Secondary | ICD-10-CM | POA: Insufficient documentation

## 2017-03-08 DIAGNOSIS — E1121 Type 2 diabetes mellitus with diabetic nephropathy: Secondary | ICD-10-CM | POA: Diagnosis not present

## 2017-03-08 DIAGNOSIS — M8668 Other chronic osteomyelitis, other site: Secondary | ICD-10-CM | POA: Diagnosis not present

## 2017-03-08 DIAGNOSIS — S91301A Unspecified open wound, right foot, initial encounter: Secondary | ICD-10-CM | POA: Diagnosis not present

## 2017-03-08 DIAGNOSIS — L97512 Non-pressure chronic ulcer of other part of right foot with fat layer exposed: Secondary | ICD-10-CM | POA: Diagnosis not present

## 2017-03-08 DIAGNOSIS — G9009 Other idiopathic peripheral autonomic neuropathy: Secondary | ICD-10-CM | POA: Diagnosis not present

## 2017-03-08 DIAGNOSIS — M86471 Chronic osteomyelitis with draining sinus, right ankle and foot: Secondary | ICD-10-CM | POA: Diagnosis not present

## 2017-03-09 ENCOUNTER — Other Ambulatory Visit: Payer: Self-pay | Admitting: Internal Medicine

## 2017-03-09 DIAGNOSIS — L97512 Non-pressure chronic ulcer of other part of right foot with fat layer exposed: Secondary | ICD-10-CM

## 2017-03-09 DIAGNOSIS — M86471 Chronic osteomyelitis with draining sinus, right ankle and foot: Secondary | ICD-10-CM

## 2017-03-23 ENCOUNTER — Ambulatory Visit (HOSPITAL_COMMUNITY)
Admission: RE | Admit: 2017-03-23 | Discharge: 2017-03-23 | Disposition: A | Discharge status: Home / Self Care | Payer: PPO | Source: Ambulatory Visit | Attending: Internal Medicine | Admitting: Internal Medicine

## 2017-03-23 ENCOUNTER — Encounter (HOSPITAL_COMMUNITY): Payer: Self-pay

## 2017-03-23 DIAGNOSIS — E1121 Type 2 diabetes mellitus with diabetic nephropathy: Secondary | ICD-10-CM | POA: Insufficient documentation

## 2017-03-23 DIAGNOSIS — M86471 Chronic osteomyelitis with draining sinus, right ankle and foot: Secondary | ICD-10-CM

## 2017-03-23 DIAGNOSIS — L97512 Non-pressure chronic ulcer of other part of right foot with fat layer exposed: Secondary | ICD-10-CM | POA: Insufficient documentation

## 2017-03-23 DIAGNOSIS — M7989 Other specified soft tissue disorders: Secondary | ICD-10-CM | POA: Diagnosis not present

## 2017-03-23 LAB — POCT I-STAT CREATININE: Creatinine, Ser: 0.7 mg/dL (ref 0.61–1.24)

## 2017-03-23 MED ORDER — IOPAMIDOL (ISOVUE-300) INJECTION 61%
INTRAVENOUS | Status: AC
  Filled 2017-03-23: qty 100

## 2017-03-23 MED ORDER — IOPAMIDOL (ISOVUE-300) INJECTION 61%
100.0000 mL | Freq: Once | INTRAVENOUS | Status: AC | PRN
  Administered 2017-03-23: 100 mL via INTRAVENOUS

## 2017-04-05 ENCOUNTER — Encounter (HOSPITAL_BASED_OUTPATIENT_CLINIC_OR_DEPARTMENT_OTHER): Payer: PPO | Attending: Internal Medicine

## 2017-04-05 DIAGNOSIS — L97512 Non-pressure chronic ulcer of other part of right foot with fat layer exposed: Secondary | ICD-10-CM | POA: Insufficient documentation

## 2017-04-05 DIAGNOSIS — S91301A Unspecified open wound, right foot, initial encounter: Secondary | ICD-10-CM | POA: Diagnosis not present

## 2017-04-05 DIAGNOSIS — M8668 Other chronic osteomyelitis, other site: Secondary | ICD-10-CM | POA: Diagnosis not present

## 2017-04-05 DIAGNOSIS — M86471 Chronic osteomyelitis with draining sinus, right ankle and foot: Secondary | ICD-10-CM | POA: Insufficient documentation

## 2017-04-12 DIAGNOSIS — L97512 Non-pressure chronic ulcer of other part of right foot with fat layer exposed: Secondary | ICD-10-CM | POA: Diagnosis not present

## 2017-04-12 DIAGNOSIS — M8668 Other chronic osteomyelitis, other site: Secondary | ICD-10-CM | POA: Diagnosis not present

## 2017-04-26 DIAGNOSIS — M8668 Other chronic osteomyelitis, other site: Secondary | ICD-10-CM | POA: Diagnosis not present

## 2017-04-26 DIAGNOSIS — L97512 Non-pressure chronic ulcer of other part of right foot with fat layer exposed: Secondary | ICD-10-CM | POA: Diagnosis not present

## 2017-04-26 DIAGNOSIS — S91301A Unspecified open wound, right foot, initial encounter: Secondary | ICD-10-CM | POA: Diagnosis not present

## 2017-05-03 ENCOUNTER — Encounter (HOSPITAL_BASED_OUTPATIENT_CLINIC_OR_DEPARTMENT_OTHER): Payer: PPO | Attending: Internal Medicine

## 2017-05-03 DIAGNOSIS — L97512 Non-pressure chronic ulcer of other part of right foot with fat layer exposed: Secondary | ICD-10-CM | POA: Insufficient documentation

## 2017-05-03 DIAGNOSIS — E11610 Type 2 diabetes mellitus with diabetic neuropathic arthropathy: Secondary | ICD-10-CM | POA: Insufficient documentation

## 2017-05-03 DIAGNOSIS — E1169 Type 2 diabetes mellitus with other specified complication: Secondary | ICD-10-CM | POA: Diagnosis not present

## 2017-05-03 DIAGNOSIS — M86471 Chronic osteomyelitis with draining sinus, right ankle and foot: Secondary | ICD-10-CM | POA: Insufficient documentation

## 2017-05-03 DIAGNOSIS — M8668 Other chronic osteomyelitis, other site: Secondary | ICD-10-CM | POA: Diagnosis not present

## 2017-05-03 DIAGNOSIS — E1151 Type 2 diabetes mellitus with diabetic peripheral angiopathy without gangrene: Secondary | ICD-10-CM | POA: Diagnosis not present

## 2017-05-03 DIAGNOSIS — S91301A Unspecified open wound, right foot, initial encounter: Secondary | ICD-10-CM | POA: Diagnosis not present

## 2017-05-04 ENCOUNTER — Encounter: Payer: Self-pay | Admitting: Internal Medicine

## 2017-05-04 ENCOUNTER — Ambulatory Visit (INDEPENDENT_AMBULATORY_CARE_PROVIDER_SITE_OTHER): Payer: PPO | Admitting: Internal Medicine

## 2017-05-04 VITALS — BP 158/98 | HR 66 | Ht 67.0 in | Wt 239.4 lb

## 2017-05-04 DIAGNOSIS — I442 Atrioventricular block, complete: Secondary | ICD-10-CM

## 2017-05-04 DIAGNOSIS — I10 Essential (primary) hypertension: Secondary | ICD-10-CM

## 2017-05-04 MED ORDER — LISINOPRIL 20 MG PO TABS: 20.0000 mg | ORAL_TABLET | Freq: Every day | ORAL | 3 refills | Status: DC

## 2017-05-04 MED ORDER — CARVEDILOL 25 MG PO TABS: 25.0000 mg | ORAL_TABLET | Freq: Two times a day (BID) | ORAL | 3 refills | Status: DC

## 2017-05-04 MED ORDER — ATORVASTATIN CALCIUM 80 MG PO TABS: 80.0000 mg | ORAL_TABLET | Freq: Every day | ORAL | 3 refills | Status: AC

## 2017-05-04 NOTE — Patient Instructions (Signed)
Medication Instructions:   Increase Coreg to 25mg  twice a day.  Continue all other medications.    Labwork: none  Testing/Procedures: none  Follow-Up: Your physician wants you to follow up in:  1 year.  You will receive a reminder letter in the mail one-two months in advance.  If you don't receive a letter, please call our office to schedule the follow up appointment.  (ALLRED)  Any Other Special Instructions Will Be Listed Below (If Applicable).  Remote monitoring is used to monitor your Pacemaker of ICD from home. This monitoring reduces the number of office visits required to check your device to one time per year. It allows [place] to keep an eye on the functioning of your device to ensure it is working properly. You are scheduled for a device check from home on 05-28-2017. You may send your transmission at any time that day. If you have a wireless device, the transmission will be sent automatically. After your physician reviews your transmission, you will receive a postcard with your next transmission date.  2 gm sodium diet   If you need a refill on your cardiac medications before your next appointment, please call your pharmacy.

## 2017-05-04 NOTE — Progress Notes (Signed)
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  Since last being seen in our clinic, the patient reports doing reasonably well.  He has had difficulty with R leg osteomyelitis for years.  He has had a wound vac for the last 3 weeks.  He continues to play golf and mow his lawn.  Today, he denies symptoms of palpitations, chest pain, shortness of breath,  lower extremity edema, dizziness, presyncope, or syncope.  The patient is otherwise without complaint today.   Past Medical History:  Diagnosis Date  . CAD (coronary artery disease)    NSTEMI in 2001. Cardiac cath showed: LAD: 40% proximal, LCX: 70% mid, RCA thrombotic occlusion in mid segment. s/p PCI and 2 overlapped BMSs to RCA.   [name] CHF (congestive heart failure) ([place])   . Diabetes mellitus   . Hypertension   . Osteomyelitis [facility])    Past Surgical History:  Procedure Laterality Date  . CARDIAC CATHETERIZATION    . CORONARY ANGIOPLASTY    . PERMANENT PACEMAKER INSERTION N/A 01/22/2015   MDT Advisa pacemaker implanted for complete heart block by Dr Rayann Heman  . TEMPORARY PACEMAKER INSERTION N/A 01/21/2015   Procedure: TEMPORARY PACEMAKER INSERTION;  Surgeon: Leonie Man, MD;  Location: [facility] C[facility] LAB;  Service: Cardiovascular;  Laterality: N/A;    ROS- all systems are reviewed and negative except as per HPI above  Current Outpatient Prescriptions  Medication Sig Dispense Refill  . aspirin EC 81 MG tablet Take 1 tablet (81 mg total) by mouth daily. 30 tablet 0  . atorvastatin (LIPITOR) 80 MG tablet Take 1 tablet by mouth daily.    . carvedilol (COREG) 12.5 MG tablet Take 1 tablet (12.5 mg total) by mouth 2 (two) times daily. 180 tablet 3  . lisinopril (PRINIVIL,ZESTRIL) 20 MG tablet Take 1 tablet (20 mg total) by mouth daily. 90 tablet 3  . metFORMIN (GLUCOPHAGE) 500 MG tablet Take 1 tablet (500 mg total) by mouth 2 (two) times  daily with a meal. 60 tablet 0  . nitroGLYCERIN (NITROSTAT) 0.4 MG SL tablet Place 1 tablet (0.4 mg total) under the tongue every 5 (five) minutes as needed for chest pain. 20 tablet 0   No current facility-administered medications for this visit.     Physical Exam: Vitals:   05/04/17 1005  BP: (!) 158/98  Pulse: 66  SpO2: 97%  Weight: 239 lb 6.4 oz (108.6 kg)  Height: 5\' 7"  (1.702 m)    GEN- The patient is well appearing, alert and oriented x 3 today.   Head- normocephalic, atraumatic Eyes-  Sclera clear, conjunctiva pink Ears- hearing intact Oropharynx- clear Lungs- Clear to ausculation bilaterally, normal work of breathing Chest- pacemaker pocket is well healed Heart- Regular rate and rhythm (V paced) GI- soft, NT, ND, + BS Extremities- no clubbing, cyanosis, or edema  Pacemaker interrogation- reviewed in detail today,  See PACEART report ekg today reveals V paced rhythm  Assessment and Plan:  1. Complete heart block Normal pacemaker function See Pace Art report No changes today At time of implant, only a ventricular lead could be placed due to venous anatomy issues. He has done great with VVI pacing and currently we do not plan to upgrade his device.  2. CAD No ischemic symptoms Stable No change required today  3. HTN Elevated today Increase coreg to 25mg  BID 2 gram sodium restriction  4. Obesity Body mass index  is 37.5 kg/m. Weight loss is advised  Follow-up: carelink Return to see me in 1 year  Thompson Grayer MD, [facility] 05/04/2017 10:40 AM

## 2017-05-07 DIAGNOSIS — L97512 Non-pressure chronic ulcer of other part of right foot with fat layer exposed: Secondary | ICD-10-CM | POA: Diagnosis not present

## 2017-05-08 LAB — CUP PACEART INCLINIC DEVICE CHECK
Battery Remaining Longevity: 94 mo
Battery Voltage: 3.02 V
Brady Statistic RV Percent Paced: 99.42 %
Date Time Interrogation Session: 20180706143015
Implantable Lead Implant Date: 20160327
Implantable Lead Location: 753860
Implantable Lead Model: 5076
Implantable Pulse Generator Implant Date: 20160327
Lead Channel Impedance Value: 513 Ohm
Lead Channel Impedance Value: 570 Ohm
Lead Channel Pacing Threshold Amplitude: 0.5 V
Lead Channel Pacing Threshold Pulse Width: 0.4 ms
Lead Channel Sensing Intrinsic Amplitude: 17.875 mV
Lead Channel Sensing Intrinsic Amplitude: 17.875 mV
Lead Channel Setting Pacing Amplitude: 2.5 V
Lead Channel Setting Pacing Pulse Width: 0.4 ms
Lead Channel Setting Sensing Sensitivity: 0.9 mV

## 2017-05-10 DIAGNOSIS — S91301A Unspecified open wound, right foot, initial encounter: Secondary | ICD-10-CM | POA: Diagnosis not present

## 2017-05-10 DIAGNOSIS — M8668 Other chronic osteomyelitis, other site: Secondary | ICD-10-CM | POA: Diagnosis not present

## 2017-05-10 DIAGNOSIS — L97512 Non-pressure chronic ulcer of other part of right foot with fat layer exposed: Secondary | ICD-10-CM | POA: Diagnosis not present

## 2017-05-10 DIAGNOSIS — R600 Localized edema: Secondary | ICD-10-CM | POA: Diagnosis not present

## 2017-05-14 DIAGNOSIS — L97512 Non-pressure chronic ulcer of other part of right foot with fat layer exposed: Secondary | ICD-10-CM | POA: Diagnosis not present

## 2017-05-24 DIAGNOSIS — L97512 Non-pressure chronic ulcer of other part of right foot with fat layer exposed: Secondary | ICD-10-CM | POA: Diagnosis not present

## 2017-05-24 DIAGNOSIS — S91301A Unspecified open wound, right foot, initial encounter: Secondary | ICD-10-CM | POA: Diagnosis not present

## 2017-05-24 DIAGNOSIS — M8668 Other chronic osteomyelitis, other site: Secondary | ICD-10-CM | POA: Diagnosis not present

## 2017-05-28 ENCOUNTER — Ambulatory Visit (INDEPENDENT_AMBULATORY_CARE_PROVIDER_SITE_OTHER): Payer: PPO | Admitting: *Deleted

## 2017-05-28 ENCOUNTER — Telehealth: Payer: Self-pay | Admitting: Cardiology

## 2017-05-28 DIAGNOSIS — I442 Atrioventricular block, complete: Secondary | ICD-10-CM | POA: Diagnosis not present

## 2017-05-28 NOTE — Telephone Encounter (Signed)
LMOVM reminding pt to send remote transmission.   

## 2017-05-29 DIAGNOSIS — L97512 Non-pressure chronic ulcer of other part of right foot with fat layer exposed: Secondary | ICD-10-CM | POA: Diagnosis not present

## 2017-05-31 ENCOUNTER — Encounter (HOSPITAL_BASED_OUTPATIENT_CLINIC_OR_DEPARTMENT_OTHER): Payer: PPO | Attending: Internal Medicine

## 2017-05-31 DIAGNOSIS — L97512 Non-pressure chronic ulcer of other part of right foot with fat layer exposed: Secondary | ICD-10-CM | POA: Insufficient documentation

## 2017-05-31 DIAGNOSIS — E1143 Type 2 diabetes mellitus with diabetic autonomic (poly)neuropathy: Secondary | ICD-10-CM | POA: Insufficient documentation

## 2017-05-31 DIAGNOSIS — G9009 Other idiopathic peripheral autonomic neuropathy: Secondary | ICD-10-CM | POA: Diagnosis not present

## 2017-05-31 DIAGNOSIS — E1121 Type 2 diabetes mellitus with diabetic nephropathy: Secondary | ICD-10-CM | POA: Insufficient documentation

## 2017-05-31 DIAGNOSIS — Z9119 Patient's noncompliance with other medical treatment and regimen: Secondary | ICD-10-CM | POA: Insufficient documentation

## 2017-05-31 DIAGNOSIS — M86471 Chronic osteomyelitis with draining sinus, right ankle and foot: Secondary | ICD-10-CM | POA: Diagnosis not present

## 2017-06-01 ENCOUNTER — Encounter: Payer: Self-pay | Admitting: Cardiology

## 2017-06-01 NOTE — Progress Notes (Signed)
Remote pacemaker transmission.   

## 2017-06-04 DIAGNOSIS — M8668 Other chronic osteomyelitis, other site: Secondary | ICD-10-CM | POA: Diagnosis not present

## 2017-06-04 DIAGNOSIS — L97512 Non-pressure chronic ulcer of other part of right foot with fat layer exposed: Secondary | ICD-10-CM | POA: Diagnosis not present

## 2017-06-07 DIAGNOSIS — L97512 Non-pressure chronic ulcer of other part of right foot with fat layer exposed: Secondary | ICD-10-CM | POA: Diagnosis not present

## 2017-06-11 DIAGNOSIS — T8189XA Other complications of procedures, not elsewhere classified, initial encounter: Secondary | ICD-10-CM | POA: Diagnosis not present

## 2017-06-14 DIAGNOSIS — M8668 Other chronic osteomyelitis, other site: Secondary | ICD-10-CM | POA: Diagnosis not present

## 2017-06-14 DIAGNOSIS — L97512 Non-pressure chronic ulcer of other part of right foot with fat layer exposed: Secondary | ICD-10-CM | POA: Diagnosis not present

## 2017-06-21 DIAGNOSIS — M8668 Other chronic osteomyelitis, other site: Secondary | ICD-10-CM | POA: Diagnosis not present

## 2017-06-21 DIAGNOSIS — S91301A Unspecified open wound, right foot, initial encounter: Secondary | ICD-10-CM | POA: Diagnosis not present

## 2017-06-21 DIAGNOSIS — L97512 Non-pressure chronic ulcer of other part of right foot with fat layer exposed: Secondary | ICD-10-CM | POA: Diagnosis not present

## 2017-06-25 DIAGNOSIS — L97512 Non-pressure chronic ulcer of other part of right foot with fat layer exposed: Secondary | ICD-10-CM | POA: Diagnosis not present

## 2017-06-28 DIAGNOSIS — S91301A Unspecified open wound, right foot, initial encounter: Secondary | ICD-10-CM | POA: Diagnosis not present

## 2017-06-28 DIAGNOSIS — L97512 Non-pressure chronic ulcer of other part of right foot with fat layer exposed: Secondary | ICD-10-CM | POA: Diagnosis not present

## 2017-06-28 DIAGNOSIS — M8668 Other chronic osteomyelitis, other site: Secondary | ICD-10-CM | POA: Diagnosis not present

## 2017-07-03 LAB — CUP PACEART REMOTE DEVICE CHECK
Battery Remaining Longevity: 90 mo
Battery Voltage: 3.01 V
Brady Statistic RV Percent Paced: 99.7 %
Date Time Interrogation Session: 20180904085803
Implantable Lead Implant Date: 20160327
Implantable Lead Location: 753860
Implantable Lead Model: 5076
Implantable Pulse Generator Implant Date: 20160327
Lead Channel Impedance Value: 551 Ohm
Lead Channel Pacing Threshold Amplitude: 0.625 V
Lead Channel Pacing Threshold Pulse Width: 0.4 ms
Lead Channel Sensing Intrinsic Amplitude: 20 mV
Lead Channel Setting Pacing Amplitude: 2.5 V
Lead Channel Setting Pacing Pulse Width: 0.4 ms
Lead Channel Setting Sensing Sensitivity: 0.9 mV

## 2017-07-05 ENCOUNTER — Encounter (HOSPITAL_BASED_OUTPATIENT_CLINIC_OR_DEPARTMENT_OTHER): Payer: PPO | Attending: Internal Medicine

## 2017-07-05 DIAGNOSIS — L97512 Non-pressure chronic ulcer of other part of right foot with fat layer exposed: Secondary | ICD-10-CM | POA: Diagnosis not present

## 2017-07-05 DIAGNOSIS — S91301A Unspecified open wound, right foot, initial encounter: Secondary | ICD-10-CM | POA: Diagnosis not present

## 2017-07-05 DIAGNOSIS — E1121 Type 2 diabetes mellitus with diabetic nephropathy: Secondary | ICD-10-CM | POA: Diagnosis not present

## 2017-07-05 DIAGNOSIS — M86471 Chronic osteomyelitis with draining sinus, right ankle and foot: Secondary | ICD-10-CM | POA: Insufficient documentation

## 2017-07-05 DIAGNOSIS — M8668 Other chronic osteomyelitis, other site: Secondary | ICD-10-CM | POA: Diagnosis not present

## 2017-07-05 DIAGNOSIS — G9009 Other idiopathic peripheral autonomic neuropathy: Secondary | ICD-10-CM | POA: Diagnosis not present

## 2017-07-09 DIAGNOSIS — L97512 Non-pressure chronic ulcer of other part of right foot with fat layer exposed: Secondary | ICD-10-CM | POA: Diagnosis not present

## 2017-07-19 ENCOUNTER — Other Ambulatory Visit (HOSPITAL_COMMUNITY)
Admission: RE | Admit: 2017-07-19 | Discharge: 2017-07-19 | Disposition: A | Discharge status: Home / Self Care | Payer: PPO | Source: Other Acute Inpatient Hospital | Attending: Internal Medicine | Admitting: Internal Medicine

## 2017-07-19 DIAGNOSIS — E11621 Type 2 diabetes mellitus with foot ulcer: Secondary | ICD-10-CM | POA: Diagnosis not present

## 2017-07-19 DIAGNOSIS — S91301A Unspecified open wound, right foot, initial encounter: Secondary | ICD-10-CM | POA: Diagnosis not present

## 2017-07-19 DIAGNOSIS — M8668 Other chronic osteomyelitis, other site: Secondary | ICD-10-CM | POA: Diagnosis not present

## 2017-07-19 DIAGNOSIS — L97512 Non-pressure chronic ulcer of other part of right foot with fat layer exposed: Secondary | ICD-10-CM | POA: Insufficient documentation

## 2017-07-19 DIAGNOSIS — T8189XA Other complications of procedures, not elsewhere classified, initial encounter: Secondary | ICD-10-CM | POA: Diagnosis not present

## 2017-07-22 LAB — AEROBIC CULTURE  (SUPERFICIAL SPECIMEN)

## 2017-07-22 LAB — AEROBIC CULTURE W GRAM STAIN (SUPERFICIAL SPECIMEN)

## 2017-08-07 DIAGNOSIS — I10 Essential (primary) hypertension: Secondary | ICD-10-CM | POA: Diagnosis not present

## 2017-08-07 DIAGNOSIS — E11622 Type 2 diabetes mellitus with other skin ulcer: Secondary | ICD-10-CM | POA: Diagnosis not present

## 2017-08-07 DIAGNOSIS — E782 Mixed hyperlipidemia: Secondary | ICD-10-CM | POA: Diagnosis not present

## 2017-08-07 DIAGNOSIS — Z23 Encounter for immunization: Secondary | ICD-10-CM | POA: Diagnosis not present

## 2017-08-16 ENCOUNTER — Encounter (HOSPITAL_BASED_OUTPATIENT_CLINIC_OR_DEPARTMENT_OTHER): Payer: PPO | Attending: Internal Medicine

## 2017-08-16 DIAGNOSIS — L97516 Non-pressure chronic ulcer of other part of right foot with bone involvement without evidence of necrosis: Secondary | ICD-10-CM | POA: Insufficient documentation

## 2017-08-16 DIAGNOSIS — M8668 Other chronic osteomyelitis, other site: Secondary | ICD-10-CM | POA: Diagnosis not present

## 2017-08-16 DIAGNOSIS — S81801A Unspecified open wound, right lower leg, initial encounter: Secondary | ICD-10-CM | POA: Diagnosis not present

## 2017-08-16 DIAGNOSIS — M86471 Chronic osteomyelitis with draining sinus, right ankle and foot: Secondary | ICD-10-CM | POA: Diagnosis not present

## 2017-08-16 DIAGNOSIS — E1121 Type 2 diabetes mellitus with diabetic nephropathy: Secondary | ICD-10-CM | POA: Diagnosis not present

## 2017-08-16 DIAGNOSIS — E1169 Type 2 diabetes mellitus with other specified complication: Secondary | ICD-10-CM | POA: Diagnosis not present

## 2017-08-30 ENCOUNTER — Ambulatory Visit (INDEPENDENT_AMBULATORY_CARE_PROVIDER_SITE_OTHER): Payer: PPO | Admitting: *Deleted

## 2017-08-30 DIAGNOSIS — I442 Atrioventricular block, complete: Secondary | ICD-10-CM

## 2017-08-31 ENCOUNTER — Encounter: Payer: Self-pay | Admitting: Cardiology

## 2017-08-31 NOTE — Progress Notes (Signed)
Remote pacemaker transmission.   

## 2017-09-26 LAB — CUP PACEART REMOTE DEVICE CHECK
Battery Remaining Longevity: 96 mo
Battery Voltage: 3.02 V
Brady Statistic RV Percent Paced: 99.6 %
Date Time Interrogation Session: 20181128114404
Implantable Lead Implant Date: 20160325
Implantable Lead Location: 753860
Implantable Lead Model: 5076
Implantable Pulse Generator Implant Date: 20160325
Lead Channel Impedance Value: 551 Ohm
Lead Channel Pacing Threshold Amplitude: 0.625 V
Lead Channel Pacing Threshold Pulse Width: 0.4 ms
Lead Channel Sensing Intrinsic Amplitude: 20 mV
Lead Channel Setting Pacing Amplitude: 2.5 V
Lead Channel Setting Pacing Pulse Width: 0.4 ms
Lead Channel Setting Sensing Sensitivity: 0.9 mV

## 2017-09-27 ENCOUNTER — Encounter (HOSPITAL_BASED_OUTPATIENT_CLINIC_OR_DEPARTMENT_OTHER): Payer: PPO | Attending: Internal Medicine

## 2017-09-27 DIAGNOSIS — M8668 Other chronic osteomyelitis, other site: Secondary | ICD-10-CM | POA: Diagnosis not present

## 2017-09-27 DIAGNOSIS — L97516 Non-pressure chronic ulcer of other part of right foot with bone involvement without evidence of necrosis: Secondary | ICD-10-CM | POA: Diagnosis not present

## 2017-09-27 DIAGNOSIS — E11621 Type 2 diabetes mellitus with foot ulcer: Secondary | ICD-10-CM | POA: Diagnosis not present

## 2017-09-27 DIAGNOSIS — E1169 Type 2 diabetes mellitus with other specified complication: Secondary | ICD-10-CM | POA: Insufficient documentation

## 2017-09-27 DIAGNOSIS — M86471 Chronic osteomyelitis with draining sinus, right ankle and foot: Secondary | ICD-10-CM | POA: Insufficient documentation

## 2017-09-27 DIAGNOSIS — T8189XA Other complications of procedures, not elsewhere classified, initial encounter: Secondary | ICD-10-CM | POA: Diagnosis not present

## 2017-09-27 DIAGNOSIS — S91301A Unspecified open wound, right foot, initial encounter: Secondary | ICD-10-CM | POA: Diagnosis not present

## 2017-11-22 ENCOUNTER — Encounter (HOSPITAL_BASED_OUTPATIENT_CLINIC_OR_DEPARTMENT_OTHER): Payer: PPO | Attending: Internal Medicine

## 2017-11-29 ENCOUNTER — Ambulatory Visit (INDEPENDENT_AMBULATORY_CARE_PROVIDER_SITE_OTHER): Payer: PPO | Admitting: *Deleted

## 2017-11-29 DIAGNOSIS — I442 Atrioventricular block, complete: Secondary | ICD-10-CM

## 2017-11-29 NOTE — Progress Notes (Signed)
Remote pacemaker transmission.   

## 2017-11-30 ENCOUNTER — Encounter: Payer: Self-pay | Admitting: Cardiology

## 2017-12-03 ENCOUNTER — Encounter (HOSPITAL_BASED_OUTPATIENT_CLINIC_OR_DEPARTMENT_OTHER): Payer: PPO | Attending: Internal Medicine

## 2017-12-03 DIAGNOSIS — L97512 Non-pressure chronic ulcer of other part of right foot with fat layer exposed: Secondary | ICD-10-CM | POA: Insufficient documentation

## 2017-12-03 DIAGNOSIS — S91301A Unspecified open wound, right foot, initial encounter: Secondary | ICD-10-CM | POA: Diagnosis not present

## 2017-12-03 DIAGNOSIS — M8668 Other chronic osteomyelitis, other site: Secondary | ICD-10-CM | POA: Diagnosis not present

## 2017-12-04 DIAGNOSIS — T8189XA Other complications of procedures, not elsewhere classified, initial encounter: Secondary | ICD-10-CM | POA: Diagnosis not present

## 2017-12-14 LAB — CUP PACEART REMOTE DEVICE CHECK
Battery Remaining Longevity: 91 mo
Battery Voltage: 3.01 V
Brady Statistic RV Percent Paced: 99.42 %
Date Time Interrogation Session: 20190130152127
Implantable Lead Implant Date: 20160325
Implantable Lead Location: 753860
Implantable Lead Model: 5076
Implantable Pulse Generator Implant Date: 20160325
Lead Channel Impedance Value: 513 Ohm
Lead Channel Impedance Value: 551 Ohm
Lead Channel Pacing Threshold Amplitude: 0.625 V
Lead Channel Pacing Threshold Pulse Width: 0.4 ms
Lead Channel Sensing Intrinsic Amplitude: 21.25 mV
Lead Channel Sensing Intrinsic Amplitude: 21.25 mV
Lead Channel Setting Pacing Amplitude: 2.5 V
Lead Channel Setting Pacing Pulse Width: 0.4 ms
Lead Channel Setting Sensing Sensitivity: 0.9 mV

## 2017-12-17 ENCOUNTER — Other Ambulatory Visit: Payer: Self-pay

## 2017-12-17 ENCOUNTER — Emergency Department (HOSPITAL_COMMUNITY)
Admission: EM | Admit: 2017-12-17 | Discharge: 2017-12-17 | Disposition: A | Discharge status: Home / Self Care | Payer: PPO | Attending: Emergency Medicine | Admitting: Emergency Medicine

## 2017-12-17 ENCOUNTER — Encounter (HOSPITAL_COMMUNITY): Payer: Self-pay | Admitting: Emergency Medicine

## 2017-12-17 DIAGNOSIS — E119 Type 2 diabetes mellitus without complications: Secondary | ICD-10-CM | POA: Insufficient documentation

## 2017-12-17 DIAGNOSIS — I2510 Atherosclerotic heart disease of native coronary artery without angina pectoris: Secondary | ICD-10-CM | POA: Diagnosis not present

## 2017-12-17 DIAGNOSIS — I509 Heart failure, unspecified: Secondary | ICD-10-CM | POA: Diagnosis not present

## 2017-12-17 DIAGNOSIS — B372 Candidiasis of skin and nail: Secondary | ICD-10-CM | POA: Insufficient documentation

## 2017-12-17 DIAGNOSIS — R21 Rash and other nonspecific skin eruption: Secondary | ICD-10-CM | POA: Diagnosis not present

## 2017-12-17 DIAGNOSIS — I110 Hypertensive heart disease with heart failure: Secondary | ICD-10-CM | POA: Diagnosis not present

## 2017-12-17 DIAGNOSIS — Z950 Presence of cardiac pacemaker: Secondary | ICD-10-CM | POA: Diagnosis not present

## 2017-12-17 DIAGNOSIS — Z87891 Personal history of nicotine dependence: Secondary | ICD-10-CM | POA: Insufficient documentation

## 2017-12-17 MED ORDER — NYSTATIN 100000 UNIT/GM EX POWD: Freq: Four times a day (QID) | CUTANEOUS | 2 refills | Status: DC

## 2017-12-17 MED ORDER — NYSTATIN 100000 UNIT/GM EX POWD
Freq: Once | CUTANEOUS | Status: AC
  Administered 2017-12-17: 10:00:00 via TOPICAL
  Filled 2017-12-17: qty 15

## 2017-12-17 NOTE — ED Provider Notes (Signed)
Emergency Department Provider Note   I have reviewed the triage vital signs and the nursing notes.   HISTORY  Chief Complaint Rash   HPI Nathan Miller is a 67 y.o. male with a history of umbilical hernia that is unchanged but over the last few days has had some erythema, itchiness and burning to the area.  He noticed that it was more red than normal thought may be related to sweat pants however with the pain and itching he was not sure.  She came here for further evaluation.  No rash elsewhere.  No abdominal pain, nausea, vomiting, diarrhea, constipation or other causes.  Patient states his hernia that is normal size and no new changes with that.  Illnesses or antibiotic use.  No other associated or modifying symptoms.    Past Medical History:  Diagnosis Date  . CAD (coronary artery disease)    NSTEMI in 2001. Cardiac cath showed: LAD: 40% proximal, LCX: 70% mid, RCA thrombotic occlusion in mid segment. s/p PCI and 2 overlapped BMSs to RCA.   [name] CHF (congestive heart failure) ([place])   . Diabetes mellitus   . Hypertension   . Osteomyelitis [facility])     Patient Active Problem List   Diagnosis Date Noted  . Diverticulitis of intestine with perforation without bleeding   . Blood poisoning   . Diverticulitis of intestine with perforation 06/13/2015  . Diverticulitis 06/13/2015  . 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 Faintness   . Bradycardia 01/21/2015  . Syncope 01/21/2015  . Complete heart block ([place]) 01/21/2015  . Right foot ulcer ([place]) 01/21/2015  . Diabetes mellitus type 2, uncontrolled ([place]) 01/21/2015  . Hyperlipidemia LDL goal < 100 02/11/2013  . Tobacco use disorder 02/11/2013  . Anxiety state, unspecified 02/11/2013  . Essential hypertension 05/10/2011  . Papule 03/20/2011  . Osteomyelitis ([place])   . CAD S/P percutaneous coronary angioplasty; bms X 2 to RCA 2001 01/22/2000    Class: Diagnosis of    Past Surgical History:  Procedure Laterality Date  .  CARDIAC CATHETERIZATION    . CORONARY ANGIOPLASTY    . PERMANENT PACEMAKER INSERTION N/A 01/22/2015   MDT Advisa pacemaker implanted for complete heart block by Dr Rayann Heman  . TEMPORARY PACEMAKER INSERTION N/A 01/21/2015   Procedure: TEMPORARY PACEMAKER INSERTION;  Surgeon: Leonie Man, MD;  Location[facility] C[facility] LAB;  Service: Cardiovascular;  Laterality: N/A;    Current Outpatient Rx  . Order #: [id number] Class: Normal  . Order #: [id number] Class: Normal  . Order #: [id number] Class: Normal  . Order #: [id number] Class: Normal  . Order #: [id number] Class: Normal  . Order #: [id number] Class: Normal  . Order #: [id number] Class: Print    Allergies Penicillins  Family History  Problem Relation Age of Onset  . Heart disease Father   . COPD Father   . Heart disease Paternal Grandfather     Social History Social History   Tobacco Use  . Smoking status: Former Smoker    Packs/day: 0.30    Years: 40.00    Pack years: 12.00    Types: Cigarettes    Start date: 10/16/1968    Last attempt to quit: 10/17/2007    Years since quitting: 10.1  . Smokeless tobacco: Current User    Types: Snuff  Substance Use Topics  . Alcohol use: Yes    Alcohol/week: 25.2 oz    Types: 42 Cans of beer per week    Comment: beer  . Drug use: No  Review of Systems  All other systems negative except as documented in the HPI. All pertinent positives and negatives as reviewed in the HPI. ____________________________________________   PHYSICAL EXAM:  VITAL SIGNS: ED Triage Vitals  Enc Vitals Group     BP 12/17/17 0922 (!) 149/83     Pulse Rate 12/17/17 0922 (!) 59     Resp 12/17/17 0922 18     Temp 12/17/17 0922 98.2 F (36.8 C)     Temp Source 12/17/17 0922 Oral     SpO2 12/17/17 0922 97 %     Weight 12/17/17 0923 240 lb (108.9 kg)     Height --      Head Circumference --      Peak Flow --      Pain Score --      Pain Loc --      Pain Edu? --      Excl. in [place]? --     Constitutional: Alert  and oriented. Well appearing and in no acute distress. Eyes: Conjunctivae are normal. PERRL. EOMI. Head: Atraumatic. Nose: No congestion/rhinnorhea. Mouth/Throat: Mucous membranes are moist.  Oropharynx non-erythematous. Neck: No stridor.  No meningeal signs.   Cardiovascular: Normal rate, regular rhythm. Good peripheral circulation. Grossly normal heart sounds.   Respiratory: Normal respiratory effort.  No retractions. Lungs CTAB. Gastrointestinal: Soft and nontender. No distention.  Musculoskeletal: No lower extremity tenderness nor edema. No gross deformities of extremities. Neurologic:  Normal speech and language. No gross focal neurologic deficits are appreciated.  Skin:  Skin is warm, dry and intact.  Has a denuded area in his umbilicus with some satellite lesions and erythema.  Mild excoriations in the area.  A thin white discharge associated with it.   ____________________________________________   INITIAL IMPRESSION / ASSESSMENT AND PLAN / ED COURSE  Suspect yeast skin infection as a cause for the symptoms.  Less likely to be cellulitis.  Will start on nystatin powder and follow-up with PCP.  No evidence of bowel stimulation or other complications.  No indication for imaging or labs at this time.   Pertinent labs & imaging results that were available during my care of the patient were reviewed by me and considered in my medical decision making (see chart for details).  ____________________________________________  FINAL CLINICAL IMPRESSION(S) / ED DIAGNOSES  Final diagnoses:  Rash  Yeast dermatitis     MEDICATIONS GIVEN DURING THIS VISIT:  Medications  nystatin (MYCOSTATIN/NYSTOP) topical powder (not administered)     NEW OUTPATIENT MEDICATIONS STARTED DURING THIS VISIT:  New Prescriptions   NYSTATIN (MYCOSTATIN/NYSTOP) POWDER    Apply topically 4 (four) times daily. Until 3 days after rash completely resolved.    Note:  This note was prepared with assistance  of Dragon voice recognition software. Occasional wrong-word or sound-a-like substitutions may have occurred due to the inherent limitations of voice recognition software.   Ilina Xu, Corene Cornea, MD 12/17/17 [phone]

## 2017-12-17 NOTE — ED Triage Notes (Signed)
Pt has rash/raw area in umbilicus. Pt states has umbilical hernia for years and has gained weight which makes it stick out more. Pt complaining of burning and redness to area. Denies other symptoms.

## 2017-12-27 DIAGNOSIS — C44319 Basal cell carcinoma of skin of other parts of face: Secondary | ICD-10-CM | POA: Diagnosis not present

## 2017-12-27 DIAGNOSIS — Z85828 Personal history of other malignant neoplasm of skin: Secondary | ICD-10-CM | POA: Diagnosis not present

## 2017-12-27 DIAGNOSIS — C44311 Basal cell carcinoma of skin of nose: Secondary | ICD-10-CM | POA: Diagnosis not present

## 2017-12-27 DIAGNOSIS — L570 Actinic keratosis: Secondary | ICD-10-CM | POA: Diagnosis not present

## 2017-12-27 DIAGNOSIS — D485 Neoplasm of uncertain behavior of skin: Secondary | ICD-10-CM | POA: Diagnosis not present

## 2018-01-17 DIAGNOSIS — C44311 Basal cell carcinoma of skin of nose: Secondary | ICD-10-CM | POA: Diagnosis not present

## 2018-01-17 DIAGNOSIS — C44319 Basal cell carcinoma of skin of other parts of face: Secondary | ICD-10-CM | POA: Diagnosis not present

## 2018-01-31 ENCOUNTER — Encounter (HOSPITAL_BASED_OUTPATIENT_CLINIC_OR_DEPARTMENT_OTHER): Payer: PPO | Attending: Internal Medicine

## 2018-01-31 DIAGNOSIS — E1121 Type 2 diabetes mellitus with diabetic nephropathy: Secondary | ICD-10-CM | POA: Diagnosis not present

## 2018-01-31 DIAGNOSIS — L97516 Non-pressure chronic ulcer of other part of right foot with bone involvement without evidence of necrosis: Secondary | ICD-10-CM | POA: Diagnosis not present

## 2018-01-31 DIAGNOSIS — T8189XA Other complications of procedures, not elsewhere classified, initial encounter: Secondary | ICD-10-CM | POA: Diagnosis not present

## 2018-02-25 DIAGNOSIS — E782 Mixed hyperlipidemia: Secondary | ICD-10-CM | POA: Diagnosis not present

## 2018-02-25 DIAGNOSIS — E11622 Type 2 diabetes mellitus with other skin ulcer: Secondary | ICD-10-CM | POA: Diagnosis not present

## 2018-02-25 DIAGNOSIS — I10 Essential (primary) hypertension: Secondary | ICD-10-CM | POA: Diagnosis not present

## 2018-02-28 ENCOUNTER — Ambulatory Visit (INDEPENDENT_AMBULATORY_CARE_PROVIDER_SITE_OTHER): Payer: PPO | Admitting: *Deleted

## 2018-02-28 DIAGNOSIS — I442 Atrioventricular block, complete: Secondary | ICD-10-CM

## 2018-02-28 NOTE — Progress Notes (Signed)
Remote pacemaker transmission.   

## 2018-03-05 ENCOUNTER — Encounter: Payer: Self-pay | Admitting: Cardiology

## 2018-03-12 LAB — CUP PACEART REMOTE DEVICE CHECK
Battery Remaining Longevity: 86 mo
Battery Voltage: 3.01 V
Brady Statistic RV Percent Paced: 99.71 %
Date Time Interrogation Session: 20190502162019
Implantable Lead Implant Date: 20160325
Implantable Lead Location: 753860
Implantable Lead Model: 5076
Implantable Pulse Generator Implant Date: 20160325
Lead Channel Impedance Value: 551 Ohm
Lead Channel Impedance Value: 570 Ohm
Lead Channel Pacing Threshold Amplitude: 0.5 V
Lead Channel Pacing Threshold Pulse Width: 0.4 ms
Lead Channel Sensing Intrinsic Amplitude: 18.625 mV
Lead Channel Sensing Intrinsic Amplitude: 18.625 mV
Lead Channel Setting Pacing Amplitude: 2.5 V
Lead Channel Setting Pacing Pulse Width: 0.4 ms
Lead Channel Setting Sensing Sensitivity: 0.9 mV

## 2018-04-04 ENCOUNTER — Encounter (HOSPITAL_BASED_OUTPATIENT_CLINIC_OR_DEPARTMENT_OTHER): Payer: PPO | Attending: Internal Medicine

## 2018-04-04 DIAGNOSIS — E1140 Type 2 diabetes mellitus with diabetic neuropathy, unspecified: Secondary | ICD-10-CM | POA: Diagnosis not present

## 2018-04-04 DIAGNOSIS — T8189XA Other complications of procedures, not elsewhere classified, initial encounter: Secondary | ICD-10-CM | POA: Diagnosis not present

## 2018-04-04 DIAGNOSIS — L97512 Non-pressure chronic ulcer of other part of right foot with fat layer exposed: Secondary | ICD-10-CM | POA: Insufficient documentation

## 2018-04-04 DIAGNOSIS — S91301A Unspecified open wound, right foot, initial encounter: Secondary | ICD-10-CM | POA: Diagnosis not present

## 2018-04-04 DIAGNOSIS — M86671 Other chronic osteomyelitis, right ankle and foot: Secondary | ICD-10-CM | POA: Diagnosis not present

## 2018-05-03 ENCOUNTER — Encounter: Payer: Self-pay | Admitting: Internal Medicine

## 2018-05-03 ENCOUNTER — Other Ambulatory Visit: Payer: Self-pay

## 2018-05-03 ENCOUNTER — Ambulatory Visit: Payer: PPO | Admitting: Internal Medicine

## 2018-05-03 DIAGNOSIS — I441 Atrioventricular block, second degree: Secondary | ICD-10-CM

## 2018-05-03 DIAGNOSIS — R001 Bradycardia, unspecified: Secondary | ICD-10-CM | POA: Diagnosis not present

## 2018-05-03 LAB — CUP PACEART INCLINIC DEVICE CHECK
Battery Remaining Longevity: 89 mo
Battery Voltage: 3.01 V
Brady Statistic RV Percent Paced: 99.62 %
Date Time Interrogation Session: 20190705130544
Implantable Lead Implant Date: 20160325
Implantable Lead Location: 753860
Implantable Lead Model: 5076
Implantable Pulse Generator Implant Date: 20160325
Lead Channel Impedance Value: 532 Ohm
Lead Channel Impedance Value: 551 Ohm
Lead Channel Pacing Threshold Amplitude: 0.5 V
Lead Channel Pacing Threshold Pulse Width: 0.4 ms
Lead Channel Sensing Intrinsic Amplitude: 27 mV
Lead Channel Sensing Intrinsic Amplitude: 27 mV
Lead Channel Setting Pacing Amplitude: 2.5 V
Lead Channel Setting Pacing Pulse Width: 0.4 ms
Lead Channel Setting Sensing Sensitivity: 0.9 mV

## 2018-05-03 NOTE — Patient Instructions (Signed)
Medication Instructions:  Continue all current medications.  Labwork: none  Testing/Procedures: none  Follow-Up: Your physician wants you to follow up in:  1 year.  You will receive a reminder letter in the mail one-two months in advance.  If you don't receive a letter, please call our office to schedule the follow up appointment   Any Other Special Instructions Will Be Listed Below (If Applicable). Remote monitoring is used to monitor your Pacemaker of ICD from home. This monitoring reduces the number of office visits required to check your device to one time per year. It allows [place] to keep an eye on the functioning of your device to ensure it is working properly. You are scheduled for a device check from home on 05/30/2018. You may send your transmission at any time that day. If you have a wireless device, the transmission will be sent automatically. After your physician reviews your transmission, you will receive a postcard with your next transmission date.  If you need a refill on your cardiac medications before your next appointment, please call your pharmacy.

## 2018-05-03 NOTE — Progress Notes (Signed)
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  Since last being seen in our clinic, the patient reports doing very well.  Today, he denies symptoms of palpitations, chest pain, shortness of breath,  lower extremity edema, dizziness, presyncope, or syncope.  The patient is otherwise without complaint today.   Past Medical History:  Diagnosis Date  . CAD (coronary artery disease)    NSTEMI in 2001. Cardiac cath showed: LAD: 40% proximal, LCX: 70% mid, RCA thrombotic occlusion in mid segment. s/p PCI and 2 overlapped BMSs to RCA.   [name] CHF (congestive heart failure) ([place])   . Diabetes mellitus   . Hypertension   . Osteomyelitis [facility])    Past Surgical History:  Procedure Laterality Date  . CARDIAC CATHETERIZATION    . CORONARY ANGIOPLASTY    . PERMANENT PACEMAKER INSERTION N/A 01/22/2015   MDT Advisa pacemaker implanted for complete heart block by Dr Rayann Heman  . TEMPORARY PACEMAKER INSERTION N/A 01/21/2015   Procedure: TEMPORARY PACEMAKER INSERTION;  Surgeon: Leonie Man, MD;  Location: [facility] CATH LAB;  Service: Cardiovascular;  Laterality: N/A;    ROS- all systems are reviewed and negative except as per HPI above  Current Outpatient Medications  Medication Sig Dispense Refill  . aspirin EC 81 MG tablet Take 1 tablet (81 mg total) by mouth daily. 30 tablet 0  . atorvastatin (LIPITOR) 80 MG tablet Take 1 tablet (80 mg total) by mouth daily. 90 tablet 3  . carvedilol (COREG) 25 MG tablet Take 1 tablet (25 mg total) by mouth 2 (two) times daily. 180 tablet 3  . lisinopril (PRINIVIL,ZESTRIL) 20 MG tablet Take 20 mg by mouth daily.    . metFORMIN (GLUCOPHAGE) 500 MG tablet Take 1 tablet (500 mg total) by mouth 2 (two) times daily with a meal. 60 tablet 0  . nitroGLYCERIN (NITROSTAT) 0.4 MG SL tablet Place 1 tablet (0.4 mg total) under the tongue every 5 (five)  minutes as needed for chest pain. 20 tablet 0   No current facility-administered medications for this visit.     Physical Exam: Vitals:   05/03/18 0908  BP: (!) 150/90  Pulse: 86  SpO2: 96%  Weight: 242 lb (109.8 kg)  Height: 5\' 7"  (1.702 m)    GEN- The patient is well appearing, alert and oriented x 3 today.   Head- normocephalic, atraumatic Eyes-  Sclera clear, conjunctiva pink Ears- hearing intact Oropharynx- clear Lungs- Clear to ausculation bilaterally, normal work of breathing Chest- pacemaker pocket is well healed Heart- Regular rate and rhythm, no murmurs, rubs or gallops, PMI not laterally displaced GI- soft, NT, ND, + BS Extremities- no clubbing, cyanosis, or edema  Pacemaker interrogation- reviewed in detail today,  See PACEART report   Assessment and Plan:  1. Symptomatic complete heart block Normal pacemaker function See Pace Art report No changes today At time of implant, only a ventricular lead could be placed due to venous anatomy issues. He has done great with VVI pacing and currently we do not plan to upgrade his device.  2. CAD No ischemic symptoms No changes  3. HTN Elevated today No change required today Sodium restriction advised Weight loss advised  4. Obesity Body mass index is 37.9 kg/m. Wt Readings from Last 3 Encounters:  05/03/18 242 lb (109.8 kg)  12/17/17 240 lb (108.9 kg)  05/04/17 239 lb 6.4 oz (  108.6 kg)   Carelink  Return in a year  Thompson Grayer MD, [facility] 05/03/2018 9:26 AM

## 2018-05-14 DIAGNOSIS — L570 Actinic keratosis: Secondary | ICD-10-CM | POA: Diagnosis not present

## 2018-05-14 DIAGNOSIS — D485 Neoplasm of uncertain behavior of skin: Secondary | ICD-10-CM | POA: Diagnosis not present

## 2018-05-14 DIAGNOSIS — Z85828 Personal history of other malignant neoplasm of skin: Secondary | ICD-10-CM | POA: Diagnosis not present

## 2018-05-30 ENCOUNTER — Ambulatory Visit (INDEPENDENT_AMBULATORY_CARE_PROVIDER_SITE_OTHER): Payer: PPO | Admitting: *Deleted

## 2018-05-30 ENCOUNTER — Telehealth: Payer: Self-pay

## 2018-05-30 DIAGNOSIS — I442 Atrioventricular block, complete: Secondary | ICD-10-CM

## 2018-05-30 DIAGNOSIS — C44622 Squamous cell carcinoma of skin of right upper limb, including shoulder: Secondary | ICD-10-CM | POA: Diagnosis not present

## 2018-05-30 NOTE — Telephone Encounter (Signed)
LMOVM reminding pt to send remote transmission.   

## 2018-05-31 NOTE — Progress Notes (Signed)
Remote pacemaker transmission.   

## 2018-06-06 ENCOUNTER — Encounter (HOSPITAL_BASED_OUTPATIENT_CLINIC_OR_DEPARTMENT_OTHER): Payer: PPO | Attending: Internal Medicine

## 2018-06-06 DIAGNOSIS — L97512 Non-pressure chronic ulcer of other part of right foot with fat layer exposed: Secondary | ICD-10-CM | POA: Diagnosis not present

## 2018-06-06 DIAGNOSIS — M86671 Other chronic osteomyelitis, right ankle and foot: Secondary | ICD-10-CM | POA: Diagnosis not present

## 2018-06-06 DIAGNOSIS — E1121 Type 2 diabetes mellitus with diabetic nephropathy: Secondary | ICD-10-CM | POA: Diagnosis not present

## 2018-06-07 DIAGNOSIS — T8189XA Other complications of procedures, not elsewhere classified, initial encounter: Secondary | ICD-10-CM | POA: Diagnosis not present

## 2018-06-27 LAB — CUP PACEART REMOTE DEVICE CHECK
Battery Remaining Longevity: 86 mo
Battery Voltage: 3.01 V
Brady Statistic RV Percent Paced: 99.85 %
Date Time Interrogation Session: 20190801180734
Implantable Lead Implant Date: 20160325
Implantable Lead Location: 753860
Implantable Lead Model: 5076
Implantable Pulse Generator Implant Date: 20160325
Lead Channel Impedance Value: 513 Ohm
Lead Channel Impedance Value: 551 Ohm
Lead Channel Pacing Threshold Amplitude: 0.5 V
Lead Channel Pacing Threshold Pulse Width: 0.4 ms
Lead Channel Sensing Intrinsic Amplitude: 17.625 mV
Lead Channel Sensing Intrinsic Amplitude: 17.625 mV
Lead Channel Setting Pacing Amplitude: 2.5 V
Lead Channel Setting Pacing Pulse Width: 0.4 ms
Lead Channel Setting Sensing Sensitivity: 0.9 mV

## 2018-08-01 ENCOUNTER — Encounter (HOSPITAL_BASED_OUTPATIENT_CLINIC_OR_DEPARTMENT_OTHER): Payer: PPO | Attending: Internal Medicine

## 2018-08-01 DIAGNOSIS — L97519 Non-pressure chronic ulcer of other part of right foot with unspecified severity: Secondary | ICD-10-CM | POA: Diagnosis not present

## 2018-08-01 DIAGNOSIS — E11610 Type 2 diabetes mellitus with diabetic neuropathic arthropathy: Secondary | ICD-10-CM | POA: Diagnosis not present

## 2018-08-01 DIAGNOSIS — E1151 Type 2 diabetes mellitus with diabetic peripheral angiopathy without gangrene: Secondary | ICD-10-CM | POA: Diagnosis not present

## 2018-08-01 DIAGNOSIS — Z09 Encounter for follow-up examination after completed treatment for conditions other than malignant neoplasm: Secondary | ICD-10-CM | POA: Insufficient documentation

## 2018-08-01 DIAGNOSIS — M86671 Other chronic osteomyelitis, right ankle and foot: Secondary | ICD-10-CM | POA: Diagnosis not present

## 2018-08-01 DIAGNOSIS — E1121 Type 2 diabetes mellitus with diabetic nephropathy: Secondary | ICD-10-CM | POA: Insufficient documentation

## 2018-08-01 DIAGNOSIS — G9009 Other idiopathic peripheral autonomic neuropathy: Secondary | ICD-10-CM | POA: Insufficient documentation

## 2018-08-01 DIAGNOSIS — Z872 Personal history of diseases of the skin and subcutaneous tissue: Secondary | ICD-10-CM | POA: Insufficient documentation

## 2018-08-06 DIAGNOSIS — Z23 Encounter for immunization: Secondary | ICD-10-CM | POA: Diagnosis not present

## 2018-08-26 DIAGNOSIS — I10 Essential (primary) hypertension: Secondary | ICD-10-CM | POA: Diagnosis not present

## 2018-08-26 DIAGNOSIS — E11622 Type 2 diabetes mellitus with other skin ulcer: Secondary | ICD-10-CM | POA: Diagnosis not present

## 2018-08-26 DIAGNOSIS — Z6837 Body mass index (BMI) 37.0-37.9, adult: Secondary | ICD-10-CM | POA: Diagnosis not present

## 2018-08-26 DIAGNOSIS — E782 Mixed hyperlipidemia: Secondary | ICD-10-CM | POA: Diagnosis not present

## 2018-08-30 ENCOUNTER — Ambulatory Visit (INDEPENDENT_AMBULATORY_CARE_PROVIDER_SITE_OTHER): Payer: PPO | Admitting: *Deleted

## 2018-08-30 ENCOUNTER — Telehealth: Payer: Self-pay | Admitting: Cardiology

## 2018-08-30 DIAGNOSIS — I442 Atrioventricular block, complete: Secondary | ICD-10-CM | POA: Diagnosis not present

## 2018-08-30 NOTE — Telephone Encounter (Signed)
LMOVM reminding pt to send remote transmission.   

## 2018-08-31 NOTE — Progress Notes (Signed)
Remote pacemaker transmission.   

## 2018-09-02 ENCOUNTER — Encounter: Payer: Self-pay | Admitting: Cardiology

## 2018-10-30 LAB — CUP PACEART REMOTE DEVICE CHECK
Battery Remaining Longevity: 85 mo
Battery Voltage: 3.01 V
Brady Statistic RV Percent Paced: 99.85 %
Date Time Interrogation Session: 20191101201521
Implantable Lead Implant Date: 20160325
Implantable Lead Location: 753860
Implantable Lead Model: 5076
Implantable Pulse Generator Implant Date: 20160325
Lead Channel Impedance Value: 532 Ohm
Lead Channel Impedance Value: 570 Ohm
Lead Channel Pacing Threshold Amplitude: 0.5 V
Lead Channel Pacing Threshold Pulse Width: 0.4 ms
Lead Channel Sensing Intrinsic Amplitude: 20.25 mV
Lead Channel Sensing Intrinsic Amplitude: 20.25 mV
Lead Channel Setting Pacing Amplitude: 2.5 V
Lead Channel Setting Pacing Pulse Width: 0.4 ms
Lead Channel Setting Sensing Sensitivity: 0.9 mV

## 2018-12-02 ENCOUNTER — Ambulatory Visit: Payer: PPO

## 2018-12-03 ENCOUNTER — Ambulatory Visit (INDEPENDENT_AMBULATORY_CARE_PROVIDER_SITE_OTHER): Payer: PPO

## 2018-12-03 DIAGNOSIS — R001 Bradycardia, unspecified: Secondary | ICD-10-CM

## 2018-12-03 DIAGNOSIS — I442 Atrioventricular block, complete: Secondary | ICD-10-CM | POA: Diagnosis not present

## 2018-12-07 LAB — CUP PACEART REMOTE DEVICE CHECK
Battery Remaining Longevity: 86 mo
Battery Voltage: 3.01 V
Brady Statistic RV Percent Paced: 99.65 %
Date Time Interrogation Session: 20200204183756
Implantable Lead Implant Date: 20160325
Implantable Lead Location: 753860
Implantable Lead Model: 5076
Implantable Pulse Generator Implant Date: 20160325
Lead Channel Impedance Value: 494 Ohm
Lead Channel Impedance Value: 532 Ohm
Lead Channel Pacing Threshold Amplitude: 0.625 V
Lead Channel Pacing Threshold Pulse Width: 0.4 ms
Lead Channel Sensing Intrinsic Amplitude: 20.75 mV
Lead Channel Sensing Intrinsic Amplitude: 20.75 mV
Lead Channel Setting Pacing Amplitude: 2.5 V
Lead Channel Setting Pacing Pulse Width: 0.4 ms
Lead Channel Setting Sensing Sensitivity: 0.9 mV

## 2018-12-12 NOTE — Progress Notes (Signed)
Remote pacemaker transmission.   

## 2018-12-30 DIAGNOSIS — X509XXA Other and unspecified overexertion or strenuous movements or postures, initial encounter: Secondary | ICD-10-CM | POA: Diagnosis not present

## 2018-12-30 DIAGNOSIS — S8392XA Sprain of unspecified site of left knee, initial encounter: Secondary | ICD-10-CM | POA: Diagnosis not present

## 2018-12-30 DIAGNOSIS — Z880 Allergy status to penicillin: Secondary | ICD-10-CM | POA: Diagnosis not present

## 2018-12-30 DIAGNOSIS — M1712 Unilateral primary osteoarthritis, left knee: Secondary | ICD-10-CM | POA: Diagnosis not present

## 2018-12-31 DIAGNOSIS — M1732 Unilateral post-traumatic osteoarthritis, left knee: Secondary | ICD-10-CM | POA: Diagnosis not present

## 2019-03-11 ENCOUNTER — Encounter: Payer: PPO | Admitting: *Deleted

## 2019-03-11 ENCOUNTER — Other Ambulatory Visit: Payer: Self-pay

## 2019-03-12 ENCOUNTER — Telehealth: Payer: Self-pay

## 2019-03-12 NOTE — Telephone Encounter (Signed)
Unable to speak  with patient to remind of missed remote transmission 

## 2019-03-21 ENCOUNTER — Ambulatory Visit (INDEPENDENT_AMBULATORY_CARE_PROVIDER_SITE_OTHER): Payer: PPO | Admitting: *Deleted

## 2019-03-21 DIAGNOSIS — I442 Atrioventricular block, complete: Secondary | ICD-10-CM

## 2019-03-22 LAB — CUP PACEART REMOTE DEVICE CHECK
Battery Remaining Longevity: 76 mo
Battery Voltage: 3.01 V
Brady Statistic RV Percent Paced: 99.61 %
Date Time Interrogation Session: 20200523143617
Implantable Lead Implant Date: 20160325
Implantable Lead Location: 753860
Implantable Lead Model: 5076
Implantable Pulse Generator Implant Date: 20160325
Lead Channel Impedance Value: 532 Ohm
Lead Channel Impedance Value: 551 Ohm
Lead Channel Pacing Threshold Amplitude: 0.625 V
Lead Channel Pacing Threshold Pulse Width: 0.4 ms
Lead Channel Sensing Intrinsic Amplitude: 21.625 mV
Lead Channel Sensing Intrinsic Amplitude: 21.625 mV
Lead Channel Setting Pacing Amplitude: 2.5 V
Lead Channel Setting Pacing Pulse Width: 0.4 ms
Lead Channel Setting Sensing Sensitivity: 0.9 mV

## 2019-03-26 NOTE — Progress Notes (Signed)
Letter  

## 2019-03-28 DIAGNOSIS — I10 Essential (primary) hypertension: Secondary | ICD-10-CM | POA: Diagnosis not present

## 2019-03-28 DIAGNOSIS — I25119 Atherosclerotic heart disease of native coronary artery with unspecified angina pectoris: Secondary | ICD-10-CM | POA: Diagnosis not present

## 2019-03-28 DIAGNOSIS — R0600 Dyspnea, unspecified: Secondary | ICD-10-CM | POA: Diagnosis not present

## 2019-03-28 DIAGNOSIS — Z23 Encounter for immunization: Secondary | ICD-10-CM | POA: Diagnosis not present

## 2019-03-28 DIAGNOSIS — E782 Mixed hyperlipidemia: Secondary | ICD-10-CM | POA: Diagnosis not present

## 2019-03-28 DIAGNOSIS — E11622 Type 2 diabetes mellitus with other skin ulcer: Secondary | ICD-10-CM | POA: Diagnosis not present

## 2019-04-01 ENCOUNTER — Encounter: Payer: Self-pay | Admitting: Cardiology

## 2019-04-01 DIAGNOSIS — M21371 Foot drop, right foot: Secondary | ICD-10-CM | POA: Diagnosis not present

## 2019-04-01 DIAGNOSIS — M1732 Unilateral post-traumatic osteoarthritis, left knee: Secondary | ICD-10-CM | POA: Diagnosis not present

## 2019-04-01 NOTE — Progress Notes (Signed)
Remote pacemaker transmission.   

## 2019-05-11 DIAGNOSIS — R0602 Shortness of breath: Secondary | ICD-10-CM | POA: Diagnosis not present

## 2019-05-11 DIAGNOSIS — E119 Type 2 diabetes mellitus without complications: Secondary | ICD-10-CM | POA: Diagnosis not present

## 2019-05-11 DIAGNOSIS — I509 Heart failure, unspecified: Secondary | ICD-10-CM | POA: Diagnosis not present

## 2019-05-11 DIAGNOSIS — A419 Sepsis, unspecified organism: Secondary | ICD-10-CM | POA: Diagnosis not present

## 2019-05-11 DIAGNOSIS — I2510 Atherosclerotic heart disease of native coronary artery without angina pectoris: Secondary | ICD-10-CM | POA: Diagnosis not present

## 2019-05-11 DIAGNOSIS — A491 Streptococcal infection, unspecified site: Secondary | ICD-10-CM | POA: Diagnosis not present

## 2019-05-11 DIAGNOSIS — E785 Hyperlipidemia, unspecified: Secondary | ICD-10-CM | POA: Diagnosis not present

## 2019-05-11 DIAGNOSIS — R230 Cyanosis: Secondary | ICD-10-CM | POA: Diagnosis not present

## 2019-05-11 DIAGNOSIS — I252 Old myocardial infarction: Secondary | ICD-10-CM | POA: Diagnosis not present

## 2019-05-11 DIAGNOSIS — Z7984 Long term (current) use of oral hypoglycemic drugs: Secondary | ICD-10-CM | POA: Diagnosis not present

## 2019-05-11 DIAGNOSIS — E1165 Type 2 diabetes mellitus with hyperglycemia: Secondary | ICD-10-CM | POA: Diagnosis not present

## 2019-05-11 DIAGNOSIS — Z950 Presence of cardiac pacemaker: Secondary | ICD-10-CM | POA: Diagnosis not present

## 2019-05-11 DIAGNOSIS — R0689 Other abnormalities of breathing: Secondary | ICD-10-CM | POA: Diagnosis not present

## 2019-05-11 DIAGNOSIS — Z7982 Long term (current) use of aspirin: Secondary | ICD-10-CM | POA: Diagnosis not present

## 2019-05-11 DIAGNOSIS — R0902 Hypoxemia: Secondary | ICD-10-CM | POA: Diagnosis not present

## 2019-05-11 DIAGNOSIS — Z209 Contact with and (suspected) exposure to unspecified communicable disease: Secondary | ICD-10-CM | POA: Diagnosis not present

## 2019-05-11 DIAGNOSIS — Z20828 Contact with and (suspected) exposure to other viral communicable diseases: Secondary | ICD-10-CM | POA: Diagnosis not present

## 2019-05-11 DIAGNOSIS — Z87891 Personal history of nicotine dependence: Secondary | ICD-10-CM | POA: Diagnosis not present

## 2019-05-11 DIAGNOSIS — R7989 Other specified abnormal findings of blood chemistry: Secondary | ICD-10-CM | POA: Diagnosis not present

## 2019-05-11 DIAGNOSIS — I110 Hypertensive heart disease with heart failure: Secondary | ICD-10-CM | POA: Diagnosis not present

## 2019-05-21 ENCOUNTER — Emergency Department (HOSPITAL_COMMUNITY): Payer: PPO

## 2019-05-21 ENCOUNTER — Other Ambulatory Visit: Payer: Self-pay

## 2019-05-21 ENCOUNTER — Emergency Department (HOSPITAL_COMMUNITY)
Admission: EM | Admit: 2019-05-21 | Discharge: 2019-05-21 | Disposition: A | Discharge status: Home / Self Care | Payer: PPO | Attending: Emergency Medicine | Admitting: Emergency Medicine

## 2019-05-21 DIAGNOSIS — N200 Calculus of kidney: Secondary | ICD-10-CM | POA: Diagnosis not present

## 2019-05-21 DIAGNOSIS — Z7984 Long term (current) use of oral hypoglycemic drugs: Secondary | ICD-10-CM | POA: Diagnosis not present

## 2019-05-21 DIAGNOSIS — I2510 Atherosclerotic heart disease of native coronary artery without angina pectoris: Secondary | ICD-10-CM | POA: Insufficient documentation

## 2019-05-21 DIAGNOSIS — R1031 Right lower quadrant pain: Secondary | ICD-10-CM | POA: Insufficient documentation

## 2019-05-21 DIAGNOSIS — Z7982 Long term (current) use of aspirin: Secondary | ICD-10-CM | POA: Insufficient documentation

## 2019-05-21 DIAGNOSIS — M545 Low back pain: Secondary | ICD-10-CM | POA: Insufficient documentation

## 2019-05-21 DIAGNOSIS — I509 Heart failure, unspecified: Secondary | ICD-10-CM | POA: Insufficient documentation

## 2019-05-21 DIAGNOSIS — Z950 Presence of cardiac pacemaker: Secondary | ICD-10-CM | POA: Diagnosis not present

## 2019-05-21 DIAGNOSIS — Z79899 Other long term (current) drug therapy: Secondary | ICD-10-CM | POA: Diagnosis not present

## 2019-05-21 DIAGNOSIS — M48061 Spinal stenosis, lumbar region without neurogenic claudication: Secondary | ICD-10-CM | POA: Diagnosis not present

## 2019-05-21 DIAGNOSIS — M549 Dorsalgia, unspecified: Secondary | ICD-10-CM

## 2019-05-21 DIAGNOSIS — F17220 Nicotine dependence, chewing tobacco, uncomplicated: Secondary | ICD-10-CM | POA: Diagnosis not present

## 2019-05-21 DIAGNOSIS — I110 Hypertensive heart disease with heart failure: Secondary | ICD-10-CM | POA: Diagnosis not present

## 2019-05-21 LAB — COMPREHENSIVE METABOLIC PANEL
ALT: 19 U/L (ref 0–44)
AST: 16 U/L (ref 15–41)
Albumin: 3.3 g/dL — ABNORMAL LOW (ref 3.5–5.0)
Alkaline Phosphatase: 72 U/L (ref 38–126)
Anion gap: 9 (ref 5–15)
BUN: 16 mg/dL (ref 8–23)
CO2: 30 mmol/L (ref 22–32)
Calcium: 8.9 mg/dL (ref 8.9–10.3)
Chloride: 94 mmol/L — ABNORMAL LOW (ref 98–111)
Creatinine, Ser: 0.75 mg/dL (ref 0.61–1.24)
GFR calc Af Amer: >60 mL/min (ref >60)
GFR calc non Af Amer: >60 mL/min (ref >60)
Glucose, Bld: 109 mg/dL — ABNORMAL HIGH (ref 70–99)
Potassium: 3.9 mmol/L (ref 3.5–5.1)
Sodium: 133 mmol/L — ABNORMAL LOW (ref 135–145)
Total Bilirubin: 0.9 mg/dL (ref 0.3–1.2)
Total Protein: 6.8 g/dL (ref 6.5–8.1)

## 2019-05-21 LAB — URINALYSIS, ROUTINE W REFLEX MICROSCOPIC
Bilirubin Urine: NEGATIVE
Glucose, UA: NEGATIVE mg/dL
Hgb urine dipstick: NEGATIVE
Ketones, ur: NEGATIVE mg/dL
Leukocytes,Ua: NEGATIVE
Nitrite: NEGATIVE
Protein, ur: NEGATIVE mg/dL
Specific Gravity, Urine: 1.004 — ABNORMAL LOW (ref 1.005–1.030)
pH: 7 (ref 5.0–8.0)

## 2019-05-21 LAB — CBC WITH DIFFERENTIAL/PLATELET
Abs Immature Granulocytes: 0.06 10*3/uL (ref 0.00–0.07)
Basophils Absolute: 0 10*3/uL (ref 0.0–0.1)
Basophils Relative: 0 %
Eosinophils Absolute: 0.3 10*3/uL (ref 0.0–0.5)
Eosinophils Relative: 2 %
HCT: 42.8 % (ref 39.0–52.0)
Hemoglobin: 13.9 g/dL (ref 13.0–17.0)
Immature Granulocytes: 1 %
Lymphocytes Relative: 22 %
Lymphs Abs: 2.4 10*3/uL (ref 0.7–4.0)
MCH: 29 pg (ref 26.0–34.0)
MCHC: 32.5 g/dL (ref 30.0–36.0)
MCV: 89.4 fL (ref 80.0–100.0)
Monocytes Absolute: 1 10*3/uL (ref 0.1–1.0)
Monocytes Relative: 9 %
Neutro Abs: 7.1 10*3/uL (ref 1.7–7.7)
Neutrophils Relative %: 66 %
Platelets: 261 10*3/uL (ref 150–400)
RBC: 4.79 MIL/uL (ref 4.22–5.81)
RDW: 13.2 % (ref 11.5–15.5)
WBC: 10.9 10*3/uL — ABNORMAL HIGH (ref 4.0–10.5)
nRBC: 0 % (ref 0.0–0.2)

## 2019-05-21 MED ORDER — LIDOCAINE 5 % EX PTCH
1.0000 | MEDICATED_PATCH | CUTANEOUS | Status: DC
  Administered 2019-05-21: 1 via TRANSDERMAL
  Filled 2019-05-21: qty 1

## 2019-05-21 MED ORDER — METHOCARBAMOL 500 MG PO TABS: 500.0000 mg | ORAL_TABLET | Freq: Two times a day (BID) | ORAL | 0 refills | Status: DC

## 2019-05-21 MED ORDER — LIDOCAINE 5 % EX PTCH: 1.0000 | MEDICATED_PATCH | CUTANEOUS | 0 refills | Status: DC

## 2019-05-21 NOTE — ED Provider Notes (Signed)
[facility] DEPT Provider Note   CSN: [id number] Arrival date & time: 05/21/19  1636  History   Chief Complaint Chief Complaint  Patient presents with   Back Pain   HPI Nathan Miller is a 68 y.o. male with past medical history negative for CAD, CHF, diabetes, hypertension, diverticulitis, complete heart block s/p pacemaker, tobacco abuse who presents for evaluation of flank and back pain.  Patient states he has had intermittent back pain over the last 5 days.  Pain located to right flank and travels across his lower back.  He denies fever, nausea, vomiting, chest pain, shortness of breath, abdominal pain, diarrhea, constipation or dysuria, melena or hematochezia.  He was prescribed Flexeril by his PCP.  Patient states he has taken this twice.  Pain worse with movement.  Patient states "once I get up and moving the pain gets better."  Patient states his current pain is a 8/10 however does not want anything for pain at this time.  Patient states "I try not to take things for my pain."  Patient states "I am just here because I want to know if there is anything more wrong."  Patient states he was admitted 2 weeks ago to [facility] rocking him for possible COVID infection.  Was discharged on Levaquin and completed this on Sunday.  Patient states he was told his COVID test was negative at that time however given his cough they wanted sent home on antibiotics.  Patient denies any current cough, chest pain or shortness of breath.  Denies history of IV drug use, bowel or bladder incontinence, saddle paresthesia, history of malignancy or chronic steroid use.  He does have history of osteomyelitis in right foot.  Denies additional aggravating or alleviating factors. Able to ambulate however with pain.  Pacemaker June 2016  History obtained from patient and past medical records. No interpretor was used.    HPI  Past Medical History:  Diagnosis Date   CAD (coronary artery disease)     NSTEMI in 2001. Cardiac cath showed: LAD: 40% proximal, LCX: 70% mid, RCA thrombotic occlusion in mid segment. s/p PCI and 2 overlapped BMSs to RCA.    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   Faintness    Bradycardia 01/21/2015   Syncope 01/21/2015   Complete heart block ([place]) 01/21/2015   Right foot ulcer ([place]) 01/21/2015   Diabetes mellitus type 2, uncontrolled ([place]) 01/21/2015   Hyperlipidemia LDL goal < 100 02/11/2013   Tobacco use disorder 02/11/2013   Anxiety state, unspecified 02/11/2013   Essential hypertension 05/10/2011   Papule 03/20/2011   Osteomyelitis ([place])    CAD S/P percutaneous coronary angioplasty; bms X 2 to RCA 2001 01/22/2000    Class: Diagnosis of    Past Surgical History:  Procedure Laterality Date   CARDIAC CATHETERIZATION     CORONARY ANGIOPLASTY     PERMANENT PACEMAKER INSERTION N/A 01/22/2015   MDT Advisa pacemaker implanted for complete heart block by Dr Rayann Heman   TEMPORARY PACEMAKER INSERTION N/A 01/21/2015   Procedure: TEMPORARY PACEMAKER INSERTION;  Surgeon: Leonie Man, MD;  Location: [facility] CATH LAB;  Service: Cardiovascular;  Laterality:  N/A;      Home Medications    Prior to Admission medications   Medication Sig Start Date End Date Taking? Authorizing Provider  aspirin EC 81 MG tablet Take 1 tablet (81 mg total) by mouth daily. 01/25/15  Yes Short, Noah Delaine, MD  atorvastatin (LIPITOR) 80 MG tablet Take 1 tablet (80 mg total) by mouth daily. 05/04/17  Yes Allred, Jeneen Rinks, MD  carvedilol (COREG) 25 MG tablet Take 1 tablet (25 mg total) by mouth 2 (two) times daily. 05/04/17  Yes Allred,  Jeneen Rinks, MD  cyclobenzaprine (FLEXERIL) 5 MG tablet Take 5 mg by mouth 3 (three) times daily as needed for muscle spasms.  05/20/19  Yes [provider]  furosemide (LASIX) 40 MG tablet Take 40 mg by mouth daily. 05/14/19  Yes [provider]  lisinopril (PRINIVIL,ZESTRIL) 20 MG tablet Take 20 mg by mouth daily.   Yes [provider]  metFORMIN (GLUCOPHAGE) 500 MG tablet Take 1 tablet (500 mg total) by mouth 2 (two) times daily with a meal. 01/25/15  Yes Short, Noah Delaine, MD  Multiple Vitamins-Minerals (CENTRUM ADULTS) TABS Take 1 tablet by mouth daily.   Yes [provider]  lidocaine (LIDODERM) 5 % Place 1 patch onto the skin daily. Remove & Discard patch within 12 hours or as directed by MD 05/21/19   Leelah Hanna A, PA-C  methocarbamol (ROBAXIN) 500 MG tablet Take 1 tablet (500 mg total) by mouth 2 (two) times daily. 05/21/19   Eliz Nigg A, PA-C  nitroGLYCERIN (NITROSTAT) 0.4 MG SL tablet Place 1 tablet (0.4 mg total) under the tongue every 5 (five) minutes as needed for chest pain. 01/25/15   Janece Canterbury, MD    Family History Family History  Problem Relation Age of Onset   Heart disease Father    COPD Father    Heart disease Paternal Grandfather     Social History Social History   Tobacco Use   Smoking status: Former Smoker    Packs/day: 0.30    Years: 40.00    Pack years: 12.00    Types: Cigarettes    Start date: 10/16/1968    Quit date: 10/17/2007    Years since quitting: 11.6   Smokeless tobacco: Current User    Types: Snuff  Substance Use Topics   Alcohol use: Yes    Alcohol/week: 42.0 standard drinks    Types: 42 Cans of beer per week    Comment: beer   Drug use: No     Allergies   Penicillins   Review of Systems Review of Systems  Constitutional: Negative.   HENT: Negative.   Eyes: Negative.   Respiratory: Negative.   Cardiovascular: Negative.   Gastrointestinal: Negative.   Genitourinary: Negative.     Musculoskeletal: Positive for back pain. Negative for arthralgias, gait problem, joint swelling, neck pain and neck stiffness.  Skin: Negative.   Neurological: Negative.   All other systems reviewed and are negative.    Physical Exam Updated Vital Signs BP 139/72    Pulse (!) 50    Temp 98.6 F (37 C) (Oral)    Resp 18    Ht 5\' 8"  (1.727 m)    Wt 107.5 kg    SpO2 98%    BMI 36.04 kg/m   Physical Exam  Physical Exam  Constitutional: Pt appears well-developed and well-nourished. No distress.  HENT:  Head: Normocephalic and atraumatic.  Mouth/Throat: Oropharynx is clear and moist. No oropharyngeal exudate.  Eyes: Conjunctivae are normal.  Neck: Normal range  of motion. Neck supple.  Full ROM without pain  Cardiovascular: Normal rate, regular rhythm and intact distal pulses.   Pulmonary/Chest: Effort normal and breath sounds normal. No respiratory distress. Pt has no wheezes.  Abdominal: Soft. Pt exhibits no distension. There is no tenderness, rebound or guarding. No abd bruit or pulsatile mass. Right flank pain to palpation. Musculoskeletal:  Full range of motion of the T-spine and L-spine with flexion, hyperextension, and lateral flexion. No midline tenderness or stepoffs. No tenderness to palpation of the spinous processes of the T-spine or L-spine. Mild tenderness to palpation of the paraspinous muscles of the L-spine to RIGHT. No piriformis or SI tenderness. No overlying skin changes. Negative straight leg raise. Foot drop to right foot, chronic and at baseline. Lymphadenopathy:    Pt has no cervical adenopathy.  Neurological: Pt is alert. Pt has normal reflexes.  Reflex Scores:      Bicep reflexes are 2+ on the right side and 2+ on the left side.      Brachioradialis reflexes are 2+ on the right side and 2+ on the left side.      Patellar reflexes are 2+ on the right side and 2+ on the left side.      Achilles reflexes are 2+ on the right side and 2+ on the left side. Speech  is clear and goal oriented, follows commands Normal 5/5 strength in upper and lower extremities bilaterally including dorsiflexion and plantar flexion, strong and equal grip strength Sensation normal to light and sharp touch Moves extremities without ataxia, coordination intact Normal gait Normal balance No Clonus Skin: Skin is warm and dry. No rash noted or lesions noted. Pt is not diaphoretic. No erythema, ecchymosis,edema or warmth.  Psychiatric: Pt has a normal mood and affect. Behavior is normal.  Nursing note and vitals reviewed. ED Treatments / Results  Labs (all labs ordered are listed, but only abnormal results are displayed) Labs Reviewed  CBC WITH DIFFERENTIAL/PLATELET - Abnormal; Notable for the following components:      Result Value   WBC 10.9 (*)    All other components within normal limits  COMPREHENSIVE METABOLIC PANEL - Abnormal; Notable for the following components:   Sodium 133 (*)    Chloride 94 (*)    Glucose, Bld 109 (*)    Albumin 3.3 (*)    All other components within normal limits  URINALYSIS, ROUTINE W REFLEX MICROSCOPIC - Abnormal; Notable for the following components:   Specific Gravity, Urine 1.004 (*)    All other components within normal limits  URINE CULTURE    EKG None  Radiology Ct L-spine No Charge  Result Date: 05/21/2019 CLINICAL DATA:  Low back pain EXAM: CT LUMBAR SPINE WITHOUT CONTRAST TECHNIQUE: Multidetector CT imaging of the lumbar spine was performed without intravenous contrast administration. Multiplanar CT image reconstructions were also generated. COMPARISON:  CT abdomen pelvis today FINDINGS: Segmentation: Normal Alignment: Normal Vertebrae: Negative for fracture.  No skeletal mass lesion. Paraspinal and other soft tissues: See CT abdomen pelvis results from today. Disc levels: L1-2: Disc degeneration with anterior spurring. Mild facet degeneration. Negative for stenosis L2-3: Diffuse bulging of the disc and bilateral facet  degeneration. Mild spinal stenosis. Anterior osteophytes on the right. L3-4: Diffuse bulging of the disc with moderate facet degeneration. Moderate spinal stenosis. L4-5: Mild disc bulging with moderate facet hypertrophy. Mild spinal stenosis. L5-S1: Disc degeneration and spurring. Bilateral facet hypertrophy. Mild subarticular stenosis on the left. IMPRESSION: 1. Negative for fracture or mass lumbar spine  2. Multilevel degenerative changes in the lumbar spine. Moderate spinal stenosis L3-4. Mild spinal stenosis L2-3 and L4-5. 3. CT abdomen pelvis reported separately. Electronically Signed   By: Franchot Gallo M.D.   On: 05/21/2019 20:33   Ct Renal Stone Study  Result Date: 05/21/2019 CLINICAL DATA:  Flank pain EXAM: CT ABDOMEN AND PELVIS WITHOUT CONTRAST TECHNIQUE: Multidetector CT imaging of the abdomen and pelvis was performed following the standard protocol without IV contrast. COMPARISON:  06/29/2015 FINDINGS: LOWER CHEST: There is mild lingular atelectasis. HEPATOBILIARY: The hepatic contours and density are normal. There is no intra- or extrahepatic biliary dilatation. The gallbladder is normal. PANCREAS: The pancreatic parenchymal contours are normal and there is no ductal dilatation. There is no peripancreatic fluid collection. SPLEEN: Normal. ADRENALS/URINARY TRACT: --Adrenal glands: Normal. --Right kidney/ureter: Single nonobstructing renal calculus measuring 2 mm. No hydronephrosis, perinephric stranding or solid renal mass. --Left kidney/ureter: No hydronephrosis, nephroureterolithiasis, perinephric stranding or solid renal mass. --Urinary bladder: Normal for degree of distention STOMACH/BOWEL: --Stomach/Duodenum: There is no hiatal hernia or other gastric abnormality. The duodenal course and caliber are normal. --Small bowel: No dilatation or inflammation. --Colon: No focal abnormality. --Appendix: Surgically absent. VASCULAR/LYMPHATIC: There is aortic atherosclerosis without hemodynamically  significant stenosis. No abdominal or pelvic lymphadenopathy. REPRODUCTIVE: Normal prostate size with symmetric seminal vesicles. MUSCULOSKELETAL. Multilevel degenerative disc disease and facet arthrosis. No bony spinal canal stenosis. OTHER: Fat containing ventral abdominal hernia. IMPRESSION: 1. No acute abnormality of the abdomen or pelvis. 2. 2 mm nonobstructive right nephrolithiasis. 3.  Aortic atherosclerosis (ICD10-I70.0). 4. Fat containing ventral abdominal hernia, unchanged. Electronically Signed   By: Ulyses Jarred M.D.   On: 05/21/2019 20:38   Procedures Procedures (including critical care time)  Medications Ordered in ED Medications  lidocaine (LIDODERM) 5 % 1 patch (has no administration in time range)   Initial Impression / Assessment and Plan / ED Course  I have reviewed the triage vital signs and the nursing notes.  Pertinent labs & imaging results that were available during my care of the patient were reviewed by me and considered in my medical decision making (see chart for details).  68 year old male appears otherwise well presents for evaluation of back and right flank pain.  He is afebrile, nonseptic, non-ill-appearing.  No drug use, bowel or bladder incontinence, saddle paresthesia.  Pain reproducible to palpation.  No recent injury or trauma.  He has no overlying skin changes.  Was given Flexeril by PCP which helps with his pain however this makes him extremely sleepy.  No abdominal pain radiating to his back.  Does have history of osteomyelitis in his right foot however no history in his back.  Does have pacemaker which is not MRI compatible.  No systemic symptoms.  Normal neurologic exam except he does have right foot drop which she has had times years and is at baseline.  He has brace to this.  He has negative straight leg raise.  Will obtain labs, imaging and reevaluate.  He has not wanted anything for pain at this time.  Labs and imaging personally reviewed Urinalysis  negative for infection CBC with mild leukocytosis at 15.0 Metabolic panel hyponatremia at 133, hyperglycemia at 109, no additional electrolyte, renal or liver abnormality. CT renal with right nonobstructing renal stone. CT lumbar spine with moderate spinal stenosis at L3-L4.  2100: Consulted with Dr. Franchot Gallo, radiologist given CT lumbar spine read.  Given he has a history of osteomyelitis and had chills approximately 2 weeks ago discussed possible MRI.  Patient cannot have MRI secondary to pacemaker however he states based on CT scan that this is not suspicious for an osteomyelitis currently.  Does not feel we need to rescan patient for CT scan with contrast.  States if he likely had osteomyelitis we would see soft tissue swelling and bony erosion which she does not see in his images currently.  2200: On reevaluation patient continues to not want anything for pain orally.  Will give lidocaine patch.  Pain reproducible to palpation.  I have low suspicion for cauda equina, discitis, osteomyelitis, transverse myelitis, psoas abscess, infected stone as cause of his pain.  Will DC with lidocaine patches and have him switch his Flexeril to Robaxin to help with his daytime sleepiness when he takes his Flexeril.  Also discussed Tylenol for pain.  Patient to follow-up with PCP if he has recurrent symptoms.  Favor MSK pain as this is reproducible.  Low suspicion for dissection, bacterial infection.  Patient does not meet Sirs or sepsis criteria.  He is afebrile without tachycardia, tachypnea or hypoxia.  The patient has been appropriately medically screened and/or stabilized in the ED. I have low suspicion for any other emergent medical condition which would require further screening, evaluation or treatment in the ED or require inpatient management.  Patient is hemodynamically stable and in no acute distress.  Patient able to ambulate in department prior to ED.  Evaluation does not show acute pathology that  would require ongoing or additional emergent interventions while in the emergency department or further inpatient treatment.  I have discussed the diagnosis with the patient and answered all questions.  Pain is been managed while in the emergency department and patient has no further complaints prior to discharge.  Patient is comfortable with plan discussed in room and is stable for discharge at this time.  I have discussed strict return precautions for returning to the emergency department.  Patient was encouraged to follow-up with PCP/specialist refer to at discharge.  Patient has been seen and evaluated by my attending physician, Dr. Regenia Skeeter who agrees above treatment, plan and disposition.      Final Clinical Impressions(s) / ED Diagnoses   Final diagnoses:  Back pain    ED Discharge Orders         Ordered    lidocaine (LIDODERM) 5 %  Every 24 hours     05/21/19 2205    methocarbamol (ROBAXIN) 500 MG tablet  2 times daily     05/21/19 2205           Kareen Jefferys A, PA-C 05/21/19 2206    Sherwood Gambler, MD 05/22/19 1229

## 2019-05-21 NOTE — ED Triage Notes (Signed)
Pt reports lower back pain that started last Friday. Patient denies any radiation of pain. Pt denies urination difficulties.

## 2019-05-21 NOTE — Discharge Instructions (Signed)
Contact a health care provider if: You have pain that is not relieved with rest or medicine. You have increasing pain going down into your legs or buttocks. Your pain does not improve after 2 weeks. You have pain at night. You lose weight without trying. You have a fever or chills. Get help right away if: You develop new bowel or bladder control problems. You have unusual weakness or numbness in your arms or legs. You develop nausea or vomiting. You develop abdominal pain. You feel faint.  Stop taking the Flexeril.  I have given you Robaxin which is similar however does not cause drowsiness.  Do not take both at the same time.  I have also prescribed you lidocaine patches.  You had 1 placed in the emergency department.  You will leave this on for 12 hours and remove for 12 hours.  You must be patch free for 12 hours before he completes additional patch.  You may also take Tylenol for your pain.

## 2019-05-22 LAB — URINE CULTURE: Culture: <10000 — AB

## 2019-05-30 ENCOUNTER — Telehealth (INDEPENDENT_AMBULATORY_CARE_PROVIDER_SITE_OTHER): Payer: PPO | Admitting: Internal Medicine

## 2019-05-30 DIAGNOSIS — I10 Essential (primary) hypertension: Secondary | ICD-10-CM | POA: Diagnosis not present

## 2019-05-30 DIAGNOSIS — I442 Atrioventricular block, complete: Secondary | ICD-10-CM

## 2019-05-30 DIAGNOSIS — I2510 Atherosclerotic heart disease of native coronary artery without angina pectoris: Secondary | ICD-10-CM

## 2019-05-30 NOTE — Progress Notes (Signed)
Electrophysiology TeleHealth Note   Due to national recommendations of social distancing due to COVID 19, an audio telehealth visit is felt to be most appropriate for this patient at this time.  Verbal consent was obtained by me for the telehealth visit today.  The patient does not have capability for a virtual visit.  A phone visit is therefore required today.   Date:  05/30/2019   ID:  Nathan Miller, DOB 08-31-1951, MRN [id number]  Location: patient's home  Provider location:  [facility]  Evaluation Performed: Follow-up visit  PCP:  Patient, No Pcp Per   Electrophysiologist:  Dr Johney Frame  Chief Complaint:  Pacemaker follow up  History of Present Illness:    Nathan Miller is a 68 y.o. male who presents via telehealth conferencing today.  Since last being seen in our clinic, the patient reports doing reasonably well.  He was admitted at [facility] for a couple of days for COVID rule out. He has been stable since then.  Today, he denies symptoms of palpitations, chest pain, shortness of breath,  lower extremity edema, dizziness, presyncope, or syncope.  The patient is otherwise without complaint today.  The patient denies symptoms of fevers, chills, cough, or new SOB worrisome for COVID 19.  Past Medical History:  Diagnosis Date  . CAD (coronary artery disease)    NSTEMI in 2001. Cardiac cath showed: LAD: 40% proximal, LCX: 70% mid, RCA thrombotic occlusion in mid segment. s/p PCI and 2 overlapped BMSs to RCA.   [name] CHF (congestive heart failure) (HCC)   . Diabetes mellitus   . Hypertension   . Osteomyelitis [facility])     Past Surgical History:  Procedure Laterality Date  . CARDIAC CATHETERIZATION    . CORONARY ANGIOPLASTY    . PERMANENT PACEMAKER INSERTION N/A 01/22/2015   MDT Advisa pacemaker implanted for complete heart block by Dr Johney Frame  . TEMPORARY PACEMAKER INSERTION N/A 01/21/2015   Procedure: TEMPORARY PACEMAKER INSERTION;  Surgeon: Marykay Lex, MD;  Location: [facility] CATH LAB;   Service: Cardiovascular;  Laterality: N/A;    Current Outpatient Medications  Medication Sig Dispense Refill  . aspirin EC 81 MG tablet Take 1 tablet (81 mg total) by mouth daily. 30 tablet 0  . atorvastatin (LIPITOR) 80 MG tablet Take 1 tablet (80 mg total) by mouth daily. 90 tablet 3  . carvedilol (COREG) 25 MG tablet Take 1 tablet (25 mg total) by mouth 2 (two) times daily. 180 tablet 3  . cyclobenzaprine (FLEXERIL) 5 MG tablet Take 5 mg by mouth 3 (three) times daily as needed for muscle spasms.     . furosemide (LASIX) 40 MG tablet Take 40 mg by mouth daily.    [name] lidocaine (LIDODERM) 5 % Place 1 patch onto the skin daily. Remove & Discard patch within 12 hours or as directed by MD 30 patch 0  . lisinopril (PRINIVIL,ZESTRIL) 20 MG tablet Take 20 mg by mouth daily.    . metFORMIN (GLUCOPHAGE) 500 MG tablet Take 1 tablet (500 mg total) by mouth 2 (two) times daily with a meal. 60 tablet 0  . methocarbamol (ROBAXIN) 500 MG tablet Take 1 tablet (500 mg total) by mouth 2 (two) times daily. 20 tablet 0  . Multiple Vitamins-Minerals (CENTRUM ADULTS) TABS Take 1 tablet by mouth daily.    . nitroGLYCERIN (NITROSTAT) 0.4 MG SL tablet Place 1 tablet (0.4 mg total) under the tongue every 5 (five) minutes as needed for chest pain. 20 tablet 0  No current facility-administered medications for this visit.     Allergies:   Penicillins   Social History:  The patient  reports that he quit smoking about 11 years ago. His smoking use included cigarettes. He started smoking about 50 years ago. He has a 12.00 pack-year smoking history. His smokeless tobacco use includes snuff. He reports current alcohol use of about 42.0 standard drinks of alcohol per week. He reports that he does not use drugs.   Family History:  The patient's  family history includes COPD in his father; Heart disease in his father and paternal grandfather.   ROS:  Please see the history of present illness.   All other systems are  personally reviewed and negative.    Exam:    Vital Signs:  There were no vitals taken for this visit.  Well sounding and appearing, alert and conversant, regular work of breathing,  good skin color Eyes- anicteric, neuro- grossly intact, skin- no apparent rash or lesions or cyanosis, mouth- oral mucosa is pink  Labs/Other Tests and Data Reviewed:    Recent Labs: 05/21/2019: ALT 19; BUN 16; Creatinine, Ser 0.75; Hemoglobin 13.9; Platelets 261; Potassium 3.9; Sodium 133   Wt Readings from Last 3 Encounters:  05/21/19 237 lb (107.5 kg)  05/03/18 242 lb (109.8 kg)  12/17/17 240 lb (108.9 kg)     Last device remote is reviewed from PaceART PDF which reveals normal device function   ASSESSMENT & PLAN:    1.  Complete heart block Normal device function by last remote See PaceArt report  2.  CAD No ischemic symptoms Stable No change required today  3.  HTN Stable No change required today    Follow-up:  Remotes, 1 year with me    Patient Risk:  after full review of this patients clinical status, I feel that they are at moderate risk at this time.  Today, I have spent 15 minutes with the patient with telehealth technology discussing arrhythmia management .    Randolm Idol, MD  05/30/2019 11:16 AM     [facility] [address] [place] [address] [phone] (office) [phone] (fax)

## 2019-06-17 DIAGNOSIS — M9904 Segmental and somatic dysfunction of sacral region: Secondary | ICD-10-CM | POA: Diagnosis not present

## 2019-06-17 DIAGNOSIS — M9905 Segmental and somatic dysfunction of pelvic region: Secondary | ICD-10-CM | POA: Diagnosis not present

## 2019-06-17 DIAGNOSIS — M62830 Muscle spasm of back: Secondary | ICD-10-CM | POA: Diagnosis not present

## 2019-06-17 DIAGNOSIS — M9903 Segmental and somatic dysfunction of lumbar region: Secondary | ICD-10-CM | POA: Diagnosis not present

## 2019-06-18 DIAGNOSIS — M9903 Segmental and somatic dysfunction of lumbar region: Secondary | ICD-10-CM | POA: Diagnosis not present

## 2019-06-18 DIAGNOSIS — M9905 Segmental and somatic dysfunction of pelvic region: Secondary | ICD-10-CM | POA: Diagnosis not present

## 2019-06-18 DIAGNOSIS — M9904 Segmental and somatic dysfunction of sacral region: Secondary | ICD-10-CM | POA: Diagnosis not present

## 2019-06-18 DIAGNOSIS — M62830 Muscle spasm of back: Secondary | ICD-10-CM | POA: Diagnosis not present

## 2019-06-19 DIAGNOSIS — M9904 Segmental and somatic dysfunction of sacral region: Secondary | ICD-10-CM | POA: Diagnosis not present

## 2019-06-19 DIAGNOSIS — M62830 Muscle spasm of back: Secondary | ICD-10-CM | POA: Diagnosis not present

## 2019-06-19 DIAGNOSIS — M9905 Segmental and somatic dysfunction of pelvic region: Secondary | ICD-10-CM | POA: Diagnosis not present

## 2019-06-19 DIAGNOSIS — M9903 Segmental and somatic dysfunction of lumbar region: Secondary | ICD-10-CM | POA: Diagnosis not present

## 2019-06-20 ENCOUNTER — Ambulatory Visit (INDEPENDENT_AMBULATORY_CARE_PROVIDER_SITE_OTHER): Payer: PPO | Admitting: *Deleted

## 2019-06-20 DIAGNOSIS — I442 Atrioventricular block, complete: Secondary | ICD-10-CM

## 2019-06-23 DIAGNOSIS — M9903 Segmental and somatic dysfunction of lumbar region: Secondary | ICD-10-CM | POA: Diagnosis not present

## 2019-06-23 DIAGNOSIS — M62830 Muscle spasm of back: Secondary | ICD-10-CM | POA: Diagnosis not present

## 2019-06-23 DIAGNOSIS — M9905 Segmental and somatic dysfunction of pelvic region: Secondary | ICD-10-CM | POA: Diagnosis not present

## 2019-06-23 DIAGNOSIS — M545 Low back pain: Secondary | ICD-10-CM | POA: Diagnosis not present

## 2019-06-23 DIAGNOSIS — M9904 Segmental and somatic dysfunction of sacral region: Secondary | ICD-10-CM | POA: Diagnosis not present

## 2019-06-24 LAB — CUP PACEART REMOTE DEVICE CHECK
Battery Remaining Longevity: 76 mo
Battery Voltage: 3.01 V
Brady Statistic RV Percent Paced: 98.86 %
Date Time Interrogation Session: 20200824171341
Implantable Lead Implant Date: 20160325
Implantable Lead Location: 753860
Implantable Lead Model: 5076
Implantable Pulse Generator Implant Date: 20160325
Lead Channel Impedance Value: 513 Ohm
Lead Channel Impedance Value: 551 Ohm
Lead Channel Pacing Threshold Amplitude: 0.625 V
Lead Channel Pacing Threshold Pulse Width: 0.4 ms
Lead Channel Sensing Intrinsic Amplitude: 22.125 mV
Lead Channel Sensing Intrinsic Amplitude: 22.125 mV
Lead Channel Setting Pacing Amplitude: 2.5 V
Lead Channel Setting Pacing Pulse Width: 0.4 ms
Lead Channel Setting Sensing Sensitivity: 0.9 mV

## 2019-06-25 DIAGNOSIS — M9904 Segmental and somatic dysfunction of sacral region: Secondary | ICD-10-CM | POA: Diagnosis not present

## 2019-06-25 DIAGNOSIS — M9905 Segmental and somatic dysfunction of pelvic region: Secondary | ICD-10-CM | POA: Diagnosis not present

## 2019-06-25 DIAGNOSIS — M9903 Segmental and somatic dysfunction of lumbar region: Secondary | ICD-10-CM | POA: Diagnosis not present

## 2019-06-25 DIAGNOSIS — M62830 Muscle spasm of back: Secondary | ICD-10-CM | POA: Diagnosis not present

## 2019-06-26 DIAGNOSIS — M9904 Segmental and somatic dysfunction of sacral region: Secondary | ICD-10-CM | POA: Diagnosis not present

## 2019-06-26 DIAGNOSIS — M62830 Muscle spasm of back: Secondary | ICD-10-CM | POA: Diagnosis not present

## 2019-06-26 DIAGNOSIS — M9903 Segmental and somatic dysfunction of lumbar region: Secondary | ICD-10-CM | POA: Diagnosis not present

## 2019-06-26 DIAGNOSIS — M9905 Segmental and somatic dysfunction of pelvic region: Secondary | ICD-10-CM | POA: Diagnosis not present

## 2019-06-27 ENCOUNTER — Encounter: Payer: Self-pay | Admitting: Cardiology

## 2019-06-27 NOTE — Progress Notes (Signed)
Remote pacemaker transmission.   

## 2019-06-30 DIAGNOSIS — M62830 Muscle spasm of back: Secondary | ICD-10-CM | POA: Diagnosis not present

## 2019-06-30 DIAGNOSIS — M9904 Segmental and somatic dysfunction of sacral region: Secondary | ICD-10-CM | POA: Diagnosis not present

## 2019-06-30 DIAGNOSIS — M9905 Segmental and somatic dysfunction of pelvic region: Secondary | ICD-10-CM | POA: Diagnosis not present

## 2019-06-30 DIAGNOSIS — M9903 Segmental and somatic dysfunction of lumbar region: Secondary | ICD-10-CM | POA: Diagnosis not present

## 2019-07-02 DIAGNOSIS — M9904 Segmental and somatic dysfunction of sacral region: Secondary | ICD-10-CM | POA: Diagnosis not present

## 2019-07-02 DIAGNOSIS — M9903 Segmental and somatic dysfunction of lumbar region: Secondary | ICD-10-CM | POA: Diagnosis not present

## 2019-07-02 DIAGNOSIS — M9905 Segmental and somatic dysfunction of pelvic region: Secondary | ICD-10-CM | POA: Diagnosis not present

## 2019-07-02 DIAGNOSIS — M62830 Muscle spasm of back: Secondary | ICD-10-CM | POA: Diagnosis not present

## 2019-07-03 DIAGNOSIS — M9903 Segmental and somatic dysfunction of lumbar region: Secondary | ICD-10-CM | POA: Diagnosis not present

## 2019-07-03 DIAGNOSIS — M9905 Segmental and somatic dysfunction of pelvic region: Secondary | ICD-10-CM | POA: Diagnosis not present

## 2019-07-03 DIAGNOSIS — M62830 Muscle spasm of back: Secondary | ICD-10-CM | POA: Diagnosis not present

## 2019-07-03 DIAGNOSIS — M9904 Segmental and somatic dysfunction of sacral region: Secondary | ICD-10-CM | POA: Diagnosis not present

## 2019-07-08 DIAGNOSIS — M9903 Segmental and somatic dysfunction of lumbar region: Secondary | ICD-10-CM | POA: Diagnosis not present

## 2019-07-08 DIAGNOSIS — M9904 Segmental and somatic dysfunction of sacral region: Secondary | ICD-10-CM | POA: Diagnosis not present

## 2019-07-08 DIAGNOSIS — M21371 Foot drop, right foot: Secondary | ICD-10-CM | POA: Diagnosis not present

## 2019-07-08 DIAGNOSIS — M47818 Spondylosis without myelopathy or radiculopathy, sacral and sacrococcygeal region: Secondary | ICD-10-CM | POA: Diagnosis not present

## 2019-07-08 DIAGNOSIS — M9905 Segmental and somatic dysfunction of pelvic region: Secondary | ICD-10-CM | POA: Diagnosis not present

## 2019-07-08 DIAGNOSIS — M62830 Muscle spasm of back: Secondary | ICD-10-CM | POA: Diagnosis not present

## 2019-07-08 DIAGNOSIS — M47816 Spondylosis without myelopathy or radiculopathy, lumbar region: Secondary | ICD-10-CM | POA: Diagnosis not present

## 2019-07-08 DIAGNOSIS — M1732 Unilateral post-traumatic osteoarthritis, left knee: Secondary | ICD-10-CM | POA: Diagnosis not present

## 2019-07-09 DIAGNOSIS — M9904 Segmental and somatic dysfunction of sacral region: Secondary | ICD-10-CM | POA: Diagnosis not present

## 2019-07-09 DIAGNOSIS — M9903 Segmental and somatic dysfunction of lumbar region: Secondary | ICD-10-CM | POA: Diagnosis not present

## 2019-07-09 DIAGNOSIS — M62830 Muscle spasm of back: Secondary | ICD-10-CM | POA: Diagnosis not present

## 2019-07-09 DIAGNOSIS — M9905 Segmental and somatic dysfunction of pelvic region: Secondary | ICD-10-CM | POA: Diagnosis not present

## 2019-07-10 ENCOUNTER — Other Ambulatory Visit: Payer: Self-pay | Admitting: Neurosurgery

## 2019-07-10 ENCOUNTER — Telehealth: Payer: Self-pay | Admitting: Nurse Practitioner

## 2019-07-10 DIAGNOSIS — M48062 Spinal stenosis, lumbar region with neurogenic claudication: Secondary | ICD-10-CM | POA: Diagnosis not present

## 2019-07-10 DIAGNOSIS — M9904 Segmental and somatic dysfunction of sacral region: Secondary | ICD-10-CM | POA: Diagnosis not present

## 2019-07-10 DIAGNOSIS — M9903 Segmental and somatic dysfunction of lumbar region: Secondary | ICD-10-CM | POA: Diagnosis not present

## 2019-07-10 DIAGNOSIS — M62830 Muscle spasm of back: Secondary | ICD-10-CM | POA: Diagnosis not present

## 2019-07-10 DIAGNOSIS — M5416 Radiculopathy, lumbar region: Secondary | ICD-10-CM | POA: Diagnosis not present

## 2019-07-10 DIAGNOSIS — M9905 Segmental and somatic dysfunction of pelvic region: Secondary | ICD-10-CM | POA: Diagnosis not present

## 2019-07-10 NOTE — Telephone Encounter (Signed)
Phone call to patient to verify medication list and allergies for myelogram procedure. Pt aware he will not need to hold any medications for this procedure. Pre and post procedure instructions reviewed with pt. 

## 2019-07-14 DIAGNOSIS — M9903 Segmental and somatic dysfunction of lumbar region: Secondary | ICD-10-CM | POA: Diagnosis not present

## 2019-07-14 DIAGNOSIS — M62830 Muscle spasm of back: Secondary | ICD-10-CM | POA: Diagnosis not present

## 2019-07-14 DIAGNOSIS — M9904 Segmental and somatic dysfunction of sacral region: Secondary | ICD-10-CM | POA: Diagnosis not present

## 2019-07-14 DIAGNOSIS — M9905 Segmental and somatic dysfunction of pelvic region: Secondary | ICD-10-CM | POA: Diagnosis not present

## 2019-07-17 DIAGNOSIS — M9905 Segmental and somatic dysfunction of pelvic region: Secondary | ICD-10-CM | POA: Diagnosis not present

## 2019-07-17 DIAGNOSIS — M9903 Segmental and somatic dysfunction of lumbar region: Secondary | ICD-10-CM | POA: Diagnosis not present

## 2019-07-17 DIAGNOSIS — M62830 Muscle spasm of back: Secondary | ICD-10-CM | POA: Diagnosis not present

## 2019-07-17 DIAGNOSIS — M9904 Segmental and somatic dysfunction of sacral region: Secondary | ICD-10-CM | POA: Diagnosis not present

## 2019-07-17 NOTE — Discharge Instructions (Signed)

## 2019-07-18 ENCOUNTER — Ambulatory Visit
Admission: RE | Admit: 2019-07-18 | Discharge: 2019-07-18 | Disposition: A | Discharge status: Home / Self Care | Payer: PPO | Source: Ambulatory Visit | Attending: Neurosurgery | Admitting: Neurosurgery

## 2019-07-18 ENCOUNTER — Other Ambulatory Visit: Payer: Self-pay

## 2019-07-18 DIAGNOSIS — M48062 Spinal stenosis, lumbar region with neurogenic claudication: Secondary | ICD-10-CM

## 2019-07-18 DIAGNOSIS — M5126 Other intervertebral disc displacement, lumbar region: Secondary | ICD-10-CM | POA: Diagnosis not present

## 2019-07-18 DIAGNOSIS — Z950 Presence of cardiac pacemaker: Secondary | ICD-10-CM | POA: Insufficient documentation

## 2019-07-18 DIAGNOSIS — M4316 Spondylolisthesis, lumbar region: Secondary | ICD-10-CM | POA: Diagnosis not present

## 2019-07-18 DIAGNOSIS — M48061 Spinal stenosis, lumbar region without neurogenic claudication: Secondary | ICD-10-CM | POA: Diagnosis not present

## 2019-07-18 MED ORDER — IOPAMIDOL (ISOVUE-M 200) INJECTION 41%
15.0000 mL | Freq: Once | INTRAMUSCULAR | Status: AC
  Administered 2019-07-18: 15 mL via INTRATHECAL

## 2019-07-18 MED ORDER — DIAZEPAM 5 MG PO TABS
5.0000 mg | ORAL_TABLET | Freq: Once | ORAL | Status: AC
  Administered 2019-07-18: 5 mg via ORAL

## 2019-07-21 DIAGNOSIS — Z23 Encounter for immunization: Secondary | ICD-10-CM | POA: Diagnosis not present

## 2019-07-24 DIAGNOSIS — M62830 Muscle spasm of back: Secondary | ICD-10-CM | POA: Diagnosis not present

## 2019-07-24 DIAGNOSIS — M9904 Segmental and somatic dysfunction of sacral region: Secondary | ICD-10-CM | POA: Diagnosis not present

## 2019-07-24 DIAGNOSIS — M9903 Segmental and somatic dysfunction of lumbar region: Secondary | ICD-10-CM | POA: Diagnosis not present

## 2019-07-24 DIAGNOSIS — M9905 Segmental and somatic dysfunction of pelvic region: Secondary | ICD-10-CM | POA: Diagnosis not present

## 2019-07-25 DIAGNOSIS — M86671 Other chronic osteomyelitis, right ankle and foot: Secondary | ICD-10-CM | POA: Diagnosis not present

## 2019-07-25 DIAGNOSIS — M21961 Unspecified acquired deformity of right lower leg: Secondary | ICD-10-CM | POA: Diagnosis not present

## 2019-07-25 DIAGNOSIS — E11622 Type 2 diabetes mellitus with other skin ulcer: Secondary | ICD-10-CM | POA: Diagnosis not present

## 2019-07-28 DIAGNOSIS — M62830 Muscle spasm of back: Secondary | ICD-10-CM | POA: Diagnosis not present

## 2019-07-28 DIAGNOSIS — M9903 Segmental and somatic dysfunction of lumbar region: Secondary | ICD-10-CM | POA: Diagnosis not present

## 2019-07-28 DIAGNOSIS — M9904 Segmental and somatic dysfunction of sacral region: Secondary | ICD-10-CM | POA: Diagnosis not present

## 2019-07-28 DIAGNOSIS — M9905 Segmental and somatic dysfunction of pelvic region: Secondary | ICD-10-CM | POA: Diagnosis not present

## 2019-07-30 DIAGNOSIS — M9903 Segmental and somatic dysfunction of lumbar region: Secondary | ICD-10-CM | POA: Diagnosis not present

## 2019-07-30 DIAGNOSIS — M62830 Muscle spasm of back: Secondary | ICD-10-CM | POA: Diagnosis not present

## 2019-07-30 DIAGNOSIS — M9904 Segmental and somatic dysfunction of sacral region: Secondary | ICD-10-CM | POA: Diagnosis not present

## 2019-07-30 DIAGNOSIS — M9905 Segmental and somatic dysfunction of pelvic region: Secondary | ICD-10-CM | POA: Diagnosis not present

## 2019-07-31 DIAGNOSIS — M9904 Segmental and somatic dysfunction of sacral region: Secondary | ICD-10-CM | POA: Diagnosis not present

## 2019-07-31 DIAGNOSIS — M62830 Muscle spasm of back: Secondary | ICD-10-CM | POA: Diagnosis not present

## 2019-07-31 DIAGNOSIS — M9905 Segmental and somatic dysfunction of pelvic region: Secondary | ICD-10-CM | POA: Diagnosis not present

## 2019-07-31 DIAGNOSIS — M9903 Segmental and somatic dysfunction of lumbar region: Secondary | ICD-10-CM | POA: Diagnosis not present

## 2019-08-06 DIAGNOSIS — M9904 Segmental and somatic dysfunction of sacral region: Secondary | ICD-10-CM | POA: Diagnosis not present

## 2019-08-06 DIAGNOSIS — M9905 Segmental and somatic dysfunction of pelvic region: Secondary | ICD-10-CM | POA: Diagnosis not present

## 2019-08-06 DIAGNOSIS — M62830 Muscle spasm of back: Secondary | ICD-10-CM | POA: Diagnosis not present

## 2019-08-06 DIAGNOSIS — M9903 Segmental and somatic dysfunction of lumbar region: Secondary | ICD-10-CM | POA: Diagnosis not present

## 2019-08-13 DIAGNOSIS — M9903 Segmental and somatic dysfunction of lumbar region: Secondary | ICD-10-CM | POA: Diagnosis not present

## 2019-08-13 DIAGNOSIS — M9904 Segmental and somatic dysfunction of sacral region: Secondary | ICD-10-CM | POA: Diagnosis not present

## 2019-08-13 DIAGNOSIS — M62830 Muscle spasm of back: Secondary | ICD-10-CM | POA: Diagnosis not present

## 2019-08-13 DIAGNOSIS — M9905 Segmental and somatic dysfunction of pelvic region: Secondary | ICD-10-CM | POA: Diagnosis not present

## 2019-08-14 DIAGNOSIS — M4646 Discitis, unspecified, lumbar region: Secondary | ICD-10-CM | POA: Diagnosis not present

## 2019-08-20 DIAGNOSIS — M9904 Segmental and somatic dysfunction of sacral region: Secondary | ICD-10-CM | POA: Diagnosis not present

## 2019-08-20 DIAGNOSIS — M9903 Segmental and somatic dysfunction of lumbar region: Secondary | ICD-10-CM | POA: Diagnosis not present

## 2019-08-20 DIAGNOSIS — M62830 Muscle spasm of back: Secondary | ICD-10-CM | POA: Diagnosis not present

## 2019-08-20 DIAGNOSIS — M9905 Segmental and somatic dysfunction of pelvic region: Secondary | ICD-10-CM | POA: Diagnosis not present

## 2019-08-27 DIAGNOSIS — M9905 Segmental and somatic dysfunction of pelvic region: Secondary | ICD-10-CM | POA: Diagnosis not present

## 2019-08-27 DIAGNOSIS — M62830 Muscle spasm of back: Secondary | ICD-10-CM | POA: Diagnosis not present

## 2019-08-27 DIAGNOSIS — M9904 Segmental and somatic dysfunction of sacral region: Secondary | ICD-10-CM | POA: Diagnosis not present

## 2019-08-27 DIAGNOSIS — M9903 Segmental and somatic dysfunction of lumbar region: Secondary | ICD-10-CM | POA: Diagnosis not present

## 2019-09-03 DIAGNOSIS — M9903 Segmental and somatic dysfunction of lumbar region: Secondary | ICD-10-CM | POA: Diagnosis not present

## 2019-09-03 DIAGNOSIS — M9905 Segmental and somatic dysfunction of pelvic region: Secondary | ICD-10-CM | POA: Diagnosis not present

## 2019-09-03 DIAGNOSIS — M9904 Segmental and somatic dysfunction of sacral region: Secondary | ICD-10-CM | POA: Diagnosis not present

## 2019-09-03 DIAGNOSIS — M62830 Muscle spasm of back: Secondary | ICD-10-CM | POA: Diagnosis not present

## 2019-09-17 DIAGNOSIS — M9903 Segmental and somatic dysfunction of lumbar region: Secondary | ICD-10-CM | POA: Diagnosis not present

## 2019-09-17 DIAGNOSIS — M9905 Segmental and somatic dysfunction of pelvic region: Secondary | ICD-10-CM | POA: Diagnosis not present

## 2019-09-17 DIAGNOSIS — M9904 Segmental and somatic dysfunction of sacral region: Secondary | ICD-10-CM | POA: Diagnosis not present

## 2019-09-17 DIAGNOSIS — M62830 Muscle spasm of back: Secondary | ICD-10-CM | POA: Diagnosis not present

## 2019-09-19 ENCOUNTER — Ambulatory Visit (INDEPENDENT_AMBULATORY_CARE_PROVIDER_SITE_OTHER): Payer: PPO | Admitting: *Deleted

## 2019-09-19 DIAGNOSIS — I442 Atrioventricular block, complete: Secondary | ICD-10-CM | POA: Diagnosis not present

## 2019-09-19 LAB — CUP PACEART REMOTE DEVICE CHECK
Battery Remaining Longevity: 77 mo
Battery Voltage: 3.01 V
Brady Statistic RV Percent Paced: 99.03 %
Date Time Interrogation Session: 20201120163120
Implantable Lead Implant Date: 20160325
Implantable Lead Location: 753860
Implantable Lead Model: 5076
Implantable Pulse Generator Implant Date: 20160325
Lead Channel Impedance Value: 494 Ohm
Lead Channel Impedance Value: 532 Ohm
Lead Channel Pacing Threshold Amplitude: 0.625 V
Lead Channel Pacing Threshold Pulse Width: 0.4 ms
Lead Channel Sensing Intrinsic Amplitude: 15.875 mV
Lead Channel Sensing Intrinsic Amplitude: 15.875 mV
Lead Channel Setting Pacing Amplitude: 2.5 V
Lead Channel Setting Pacing Pulse Width: 0.4 ms
Lead Channel Setting Sensing Sensitivity: 0.9 mV

## 2019-10-07 DIAGNOSIS — E11622 Type 2 diabetes mellitus with other skin ulcer: Secondary | ICD-10-CM | POA: Diagnosis not present

## 2019-10-07 DIAGNOSIS — M21371 Foot drop, right foot: Secondary | ICD-10-CM | POA: Diagnosis not present

## 2019-10-07 DIAGNOSIS — L98499 Non-pressure chronic ulcer of skin of other sites with unspecified severity: Secondary | ICD-10-CM | POA: Diagnosis not present

## 2019-10-14 NOTE — Progress Notes (Signed)
Remote pacemaker transmission.   

## 2019-10-28 DIAGNOSIS — E11622 Type 2 diabetes mellitus with other skin ulcer: Secondary | ICD-10-CM | POA: Diagnosis not present

## 2019-10-28 DIAGNOSIS — M86671 Other chronic osteomyelitis, right ankle and foot: Secondary | ICD-10-CM | POA: Diagnosis not present

## 2019-10-28 DIAGNOSIS — E669 Obesity, unspecified: Secondary | ICD-10-CM | POA: Diagnosis not present

## 2019-10-28 DIAGNOSIS — Z6839 Body mass index (BMI) 39.0-39.9, adult: Secondary | ICD-10-CM | POA: Diagnosis not present

## 2019-10-28 DIAGNOSIS — I10 Essential (primary) hypertension: Secondary | ICD-10-CM | POA: Diagnosis not present

## 2019-10-28 DIAGNOSIS — E782 Mixed hyperlipidemia: Secondary | ICD-10-CM | POA: Diagnosis not present

## 2019-10-28 DIAGNOSIS — Z7984 Long term (current) use of oral hypoglycemic drugs: Secondary | ICD-10-CM | POA: Diagnosis not present

## 2019-11-14 DIAGNOSIS — L97415 Non-pressure chronic ulcer of right heel and midfoot with muscle involvement without evidence of necrosis: Secondary | ICD-10-CM | POA: Diagnosis not present

## 2019-11-14 DIAGNOSIS — E11621 Type 2 diabetes mellitus with foot ulcer: Secondary | ICD-10-CM | POA: Diagnosis not present

## 2019-11-25 ENCOUNTER — Encounter (HOSPITAL_BASED_OUTPATIENT_CLINIC_OR_DEPARTMENT_OTHER): Payer: PPO | Admitting: Internal Medicine

## 2019-12-09 DIAGNOSIS — E1142 Type 2 diabetes mellitus with diabetic polyneuropathy: Secondary | ICD-10-CM | POA: Diagnosis not present

## 2019-12-09 DIAGNOSIS — L97411 Non-pressure chronic ulcer of right heel and midfoot limited to breakdown of skin: Secondary | ICD-10-CM | POA: Diagnosis not present

## 2019-12-09 DIAGNOSIS — B351 Tinea unguium: Secondary | ICD-10-CM | POA: Diagnosis not present

## 2019-12-10 ENCOUNTER — Encounter (HOSPITAL_BASED_OUTPATIENT_CLINIC_OR_DEPARTMENT_OTHER): Payer: PPO | Admitting: Physician Assistant

## 2019-12-22 ENCOUNTER — Telehealth: Payer: Self-pay

## 2019-12-22 NOTE — Telephone Encounter (Signed)
The pt states his monitor keep giving him the error code 3248. I gave him the number to [place] support to get additional help.

## 2019-12-25 ENCOUNTER — Ambulatory Visit (INDEPENDENT_AMBULATORY_CARE_PROVIDER_SITE_OTHER): Payer: PPO | Admitting: *Deleted

## 2019-12-25 DIAGNOSIS — I442 Atrioventricular block, complete: Secondary | ICD-10-CM

## 2019-12-25 LAB — CUP PACEART REMOTE DEVICE CHECK
Battery Remaining Longevity: 67 mo
Battery Voltage: 3 V
Brady Statistic RV Percent Paced: 99.73 %
Date Time Interrogation Session: 20210225101051
Implantable Lead Implant Date: 20160325
Implantable Lead Location: 753860
Implantable Lead Model: 5076
Implantable Pulse Generator Implant Date: 20160325
Lead Channel Impedance Value: 475 Ohm
Lead Channel Impedance Value: 532 Ohm
Lead Channel Pacing Threshold Amplitude: 0.625 V
Lead Channel Pacing Threshold Pulse Width: 0.4 ms
Lead Channel Sensing Intrinsic Amplitude: 21.75 mV
Lead Channel Sensing Intrinsic Amplitude: 21.75 mV
Lead Channel Setting Pacing Amplitude: 2.5 V
Lead Channel Setting Pacing Pulse Width: 0.4 ms
Lead Channel Setting Sensing Sensitivity: 0.9 mV

## 2019-12-26 NOTE — Progress Notes (Signed)
PPM Remote  

## 2019-12-29 ENCOUNTER — Encounter (HOSPITAL_BASED_OUTPATIENT_CLINIC_OR_DEPARTMENT_OTHER): Payer: PPO | Attending: Internal Medicine | Admitting: Internal Medicine

## 2019-12-29 ENCOUNTER — Other Ambulatory Visit: Payer: Self-pay

## 2019-12-29 DIAGNOSIS — Z950 Presence of cardiac pacemaker: Secondary | ICD-10-CM | POA: Diagnosis not present

## 2019-12-29 DIAGNOSIS — Z955 Presence of coronary angioplasty implant and graft: Secondary | ICD-10-CM | POA: Diagnosis not present

## 2019-12-29 DIAGNOSIS — I10 Essential (primary) hypertension: Secondary | ICD-10-CM | POA: Diagnosis not present

## 2019-12-29 DIAGNOSIS — T8189XA Other complications of procedures, not elsewhere classified, initial encounter: Secondary | ICD-10-CM | POA: Diagnosis not present

## 2019-12-29 DIAGNOSIS — Z87891 Personal history of nicotine dependence: Secondary | ICD-10-CM | POA: Diagnosis not present

## 2019-12-29 DIAGNOSIS — E1151 Type 2 diabetes mellitus with diabetic peripheral angiopathy without gangrene: Secondary | ICD-10-CM | POA: Insufficient documentation

## 2019-12-29 DIAGNOSIS — L97512 Non-pressure chronic ulcer of other part of right foot with fat layer exposed: Secondary | ICD-10-CM | POA: Diagnosis not present

## 2019-12-29 DIAGNOSIS — Z880 Allergy status to penicillin: Secondary | ICD-10-CM | POA: Diagnosis not present

## 2019-12-29 DIAGNOSIS — Z6838 Body mass index (BMI) 38.0-38.9, adult: Secondary | ICD-10-CM | POA: Insufficient documentation

## 2019-12-29 DIAGNOSIS — E11621 Type 2 diabetes mellitus with foot ulcer: Secondary | ICD-10-CM | POA: Insufficient documentation

## 2019-12-29 DIAGNOSIS — E1142 Type 2 diabetes mellitus with diabetic polyneuropathy: Secondary | ICD-10-CM | POA: Insufficient documentation

## 2019-12-29 DIAGNOSIS — E669 Obesity, unspecified: Secondary | ICD-10-CM | POA: Diagnosis not present

## 2019-12-29 DIAGNOSIS — I2510 Atherosclerotic heart disease of native coronary artery without angina pectoris: Secondary | ICD-10-CM | POA: Insufficient documentation

## 2019-12-29 DIAGNOSIS — L97412 Non-pressure chronic ulcer of right heel and midfoot with fat layer exposed: Secondary | ICD-10-CM | POA: Diagnosis not present

## 2019-12-29 DIAGNOSIS — E11610 Type 2 diabetes mellitus with diabetic neuropathic arthropathy: Secondary | ICD-10-CM | POA: Diagnosis not present

## 2019-12-29 DIAGNOSIS — M86671 Other chronic osteomyelitis, right ankle and foot: Secondary | ICD-10-CM | POA: Diagnosis not present

## 2019-12-29 DIAGNOSIS — M1990 Unspecified osteoarthritis, unspecified site: Secondary | ICD-10-CM | POA: Insufficient documentation

## 2019-12-29 DIAGNOSIS — M14671 Charcot's joint, right ankle and foot: Secondary | ICD-10-CM | POA: Diagnosis not present

## 2019-12-30 NOTE — Progress Notes (Signed)
steps with shuffle, may have difficulty arising from chair, 0 No head down, impaired balance) Mental Status Oriented to own ability 0 Yes Overestimates or forgets limitations 0 No Risk Level: Low Risk Score: 0 Electronic Signature(s) Signed: 12/30/2019 4:52:50 PM By: Baruch Gouty RN, BSN Entered By: Baruch Gouty on 12/29/2019 10:48:10 -------------------------------------------------------------------------------- Foot Assessment Details Patient Name: Date of Service: Nathan Eastern D. 12/29/2019 10:30 AM Medical Record [id number] Patient Account Number: [id number] Date of Birth/Sex: Treating RN: January 03, 1951 (69 y.o. Nathan Miller Primary Care Hebert Dooling: PATIENT, NO Other Clinician: Referring Farrie Sann: Treating Johnathan Heskett/Extender:Robson, Otelia Sergeant, Cherre Blanc in Treatment: 0 Foot Assessment Items Site Locations + = Sensation present, - = Sensation absent, C = Callus, U = Ulcer R = Redness, W = Warmth, M = Maceration, PU = Pre-ulcerative lesion F = Fissure, S = Swelling, D = Dryness Assessment Right: Left: Other Deformity: Yes No Prior Foot Ulcer: Yes No Prior Amputation: Yes No Charcot Joint: No No Ambulatory Status: Ambulatory Without Help Gait:  Steady Electronic Signature(s) Signed: 12/30/2019 4:52:50 PM By: Baruch Gouty RN, BSN Entered By: Baruch Gouty on 12/29/2019 10:52:22 -------------------------------------------------------------------------------- Nutrition Risk Screening Details Patient Name: Date of Service: Nathan Miller 12/29/2019 10:30 AM Medical Record [id number] Patient Account Number: [id number] Date of Birth/Sex: Treating RN: 1951/01/27 (69 y.o. Nathan Miller Primary Care Marielena Harvell: PATIENT, NO Other Clinician: Referring Genean Adamski: Treating Dessire Grimes/Extender:Robson, Otelia Sergeant, Cherre Blanc in Treatment: 0 Height (in): 68 Weight (lbs): 253 Body Mass Index (BMI): 38.5 Nutrition Risk Screening Items Score Screening NUTRITION RISK SCREEN: I have an illness or condition that made me change the kind and/or 0 No amount of food I eat I eat fewer than two meals per day 0 No I eat few fruits and vegetables, or milk products 0 No I have three or more drinks of beer, liquor or wine almost every day 0 No I have tooth or mouth problems that make it hard for me to eat 0 No I don't always have enough money to buy the food I need 0 No I eat alone most of the time 0 No I take three or more different prescribed or over-the-counter drugs a day 1 Yes 0 No Without wanting to, I have lost or gained 10 pounds in the last six months I am not always physically able to shop, cook and/or feed myself 0 No Nutrition Protocols Good Risk Protocol 0 No interventions needed Moderate Risk Protocol High Risk Proctocol Risk Level: Good Risk Score: 1 Electronic Signature(s) Signed: 12/30/2019 4:52:50 PM By: Baruch Gouty RN, BSN Entered By: Baruch Gouty on 12/29/2019 10:48:40  AMAIR, QUIST ([id number]) Visit Report for 12/29/2019 Abuse/Suicide Risk Screen Details Patient Name: Date of Service: Miller, Nathan 12/29/2019 10:30 AM Medical Record [id number] Patient Account Number: [id number] Date of Birth/Sex: Treating RN: Feb 15, 1951 (69 y.o. Nathan Miller Primary Care Hurley Blevins: PATIENT, NO Other Clinician: Referring Laria Grimmett: Treating Pier Bosher/Extender:Robson, Otelia Sergeant, Cherre Blanc in Treatment: 0 Abuse/Suicide Risk Screen Items Answer ABUSE RISK SCREEN: Has anyone close to you tried to hurt or harm you recentlyo No Do you feel uncomfortable with anyone in your familyo No Has anyone forced you do things that you didnt want to doo No Electronic Signature(s) Signed: 12/30/2019 4:52:50 PM By: Baruch Gouty RN, BSN Entered By: Baruch Gouty on 12/29/2019 10:46:56 -------------------------------------------------------------------------------- Activities of Daily Living Details Patient Name: Date of Service: Nathan, Miller 12/29/2019 10:30 AM Medical Record [id number] Patient Account Number: [id number] Date of Birth/Sex: Treating RN: [birth year] (69 y.o. Nathan Miller Primary Care Ivi Griffith: PATIENT, NO Other Clinician: Referring Velisa Regnier: Treating Demaris Leavell/Extender:Robson, Otelia Sergeant, Cherre Blanc in Treatment: 0 Activities of Daily Living Items Answer Activities of Daily Living (Please select one for each item) Drive Automobile Completely Able Take Medications Completely Able Use Telephone Completely Able Care for Appearance Completely Able Use Toilet Completely Able Bath / Shower Completely Able Dress Self Completely Able Feed Self Completely Able Walk Completely Able Get In / Out Bed Completely Able Housework Completely Able Prepare Meals Completely Able Handle Money Completely Able Shop for Self Completely Able Electronic Signature(s) Signed: 12/30/2019 4:52:50 PM By: Baruch Gouty RN, BSN Entered By:  Baruch Gouty on 12/29/2019 10:47:26 -------------------------------------------------------------------------------- Education Screening Details Patient Name: Date of Service: Nathan Eastern D. 12/29/2019 10:30 AM Medical Record [id number] Patient Account Number: [id number] Date of Birth/Sex: Treating RN: 01-Feb-1951 (69 y.o. Nathan Miller Primary Care Octavia Mottola: PATIENT, NO Other Clinician: Referring Jerilynn Feldmeier: Treating Bao Coreas/Extender:Robson, Otelia Sergeant, Cherre Blanc in Treatment: 0 Primary Learner Assessed: Patient Learning Preferences/Education Level/Primary Language Learning Preference: Explanation, Demonstration, Printed Material Highest Education Level: High School Preferred Language: English Cognitive Barrier Language Barrier: No Translator Needed: No Memory Deficit: No Emotional Barrier: No Cultural/Religious Beliefs Affecting Medical Care: No Physical Barrier Impaired Vision: No Impaired Hearing: No Decreased Hand dexterity: No Knowledge/Comprehension Knowledge Level: High Comprehension Level: High Ability to understand written High instructions: Ability to understand verbal High instructions: Motivation Anxiety Level: Calm Cooperation: Cooperative Education Importance: Acknowledges Need Interest in Health Problems: Asks Questions Perception: Coherent Willingness to Engage in Self- High Management Activities: Readiness to Engage in Self- High Management Activities: Electronic Signature(s) Signed: 12/30/2019 4:52:50 PM By: Baruch Gouty RN, BSN Entered By: Baruch Gouty on 12/29/2019 10:47:55 -------------------------------------------------------------------------------- Fall Risk Assessment Details Patient Name: Date of Service: Nathan Eastern D. 12/29/2019 10:30 AM Medical Record [id number] Patient Account Number: [id number] Date of Birth/Sex: Treating RN: 11/13/1950 (69 y.o. Nathan Miller Primary Care Alethia Melendrez: PATIENT,  NO Other Clinician: Referring Kymari Nuon: Treating Madlynn Lundeen/Extender:Robson, Otelia Sergeant, Cherre Blanc in Treatment: 0 Fall Risk Assessment Items Have you had 2 or more falls in the last 12 monthso 0 No Have you had any fall that resulted in injury in the last 12 monthso 0 No FALLS RISK SCREEN History of falling - immediate or within 3 months 0 No Secondary diagnosis (Do you have 2 or more medical diagnoseso) 0 No Ambulatory aid None/bed rest/wheelchair/nurse 0 Yes Crutches/cane/walker 0 No Furniture 0 No Intravenous therapy Access/Saline/Heparin Lock 0 No Weak (short steps with or without shuffle, stooped but able to lift head 0 No while walking, may seek support from furniture) Impaired (short

## 2019-12-30 NOTE — Progress Notes (Signed)
He's been otherwise doing excellent in my pinion he shows no signs of infection which is good news. Overall I think things are doing fairly well. 08/01/2018; patient arrives back in the clinic with the area on his right lateral foot which is inverted at the ankle totally closed. They have been using silver collagen predominantly which is close this down. This is a chronic wound that had underlying osteomyelitis. He had surgery to remove 2 metatarsals for 5 years ago by Dr. Beola Cord. At that point he refused an amputation did hyperbarics. A recent CT scan we did earlier this year did not show evidence of active osteomyelitis. We tried a wound VAC for a period of time but he could not really tolerate this. In any case this is totally closed. That outcome in itself is really quite amazing READMISSION 12/29/2019 Nathan Miller is a now 69 year old man who is in this clinic many years ago for osteomyelitis and a wound on his right lateral foot. At the time he was noted to  be an idiopathic peripheral neuropathy although I note that he since has been diagnosed with type 2 diabetes. He has a Charcot foot deformity. He had extensive orthopedic surgery on this foot as well removing under the underlying metatarsals that at the time had osteomyelitis. We followed him for a long period palliatively for a deep wound on the right lateral foot. He had refused an amputation probably 8 or 9 years ago and underwent IV antibiotics and hyperbaric oxygen. Miraculously this wound actually healed over I believe in 2019. The patient has a wound on the right lateral plantar heel which is not in the same spot as the previous wound. This is a weightbearing surface. The wound looks fairly benign but there is significant undermining. No erythema. They state that is started as a blister they used leftover silver alginate, Neosporin and trying to offload this as best he can. He has custom-made shoes with an AFO brace on the right. Past medical history is reviewed predominantly coronary artery disease, pacemaker, stent, type 2 diabetes with a recent hemoglobin A1c of 6.1. ABI in our clinic was 1.0 on the right Electronic Signature(s) Signed: 12/29/2019 5:45:55 PM By: Linton Ham MD Entered By: Linton Ham on 12/29/2019 12:31:49 -------------------------------------------------------------------------------- Physical Exam Details Patient Name: Date of Service: Nathan Miller, Nathan Miller 12/29/2019 10:30 AM Medical Record [id number] Patient Account Number: [id number] Date of Birth/Sex: Treating RN: Apr 16, 1951 (69 y.o. Nathan Miller Primary Care Provider: PATIENT, NO Other Clinician: Referring Provider: Treating Provider/Extender:Musette Kisamore, Otelia Sergeant, Cherre Blanc in Treatment: 0 Constitutional Patient is hypertensive.. Pulse regular and within target range for patient.[name] Respirations regular, non-labored and within target range.. Temperature is normal and within the target range  for the patient.[name] Appears in no distress. Respiratory work of breathing is normal. Cardiovascular Needle pulses are palpable. Musculoskeletal The patient has the usual foot deformity here very swollen area extending laterally. His original wound site was reviewed there is nothing open here.. Integumentary (Hair, Skin) No primary skin disorders are seen. Psychiatric appears at normal baseline. Notes Wound exam; the area questions on the right lateral plantar heel. Fairly benign looking wound but with roughly 1.5 cm of circumferential undermining. There is no erythema no exposed bone and no tenderness. Electronic Signature(s) Signed: 12/29/2019 5:45:55 PM By: Linton Ham MD Entered By: Linton Ham on 12/29/2019 12:34:06 -------------------------------------------------------------------------------- Physician Orders Details Patient Name: Date of Service: [name], [name] 12/29/2019 10:30 AM Medical Record [id number] Patient Account Number: [id number] Date of Birth/Sex: Treating RN:  He's been otherwise doing excellent in my pinion he shows no signs of infection which is good news. Overall I think things are doing fairly well. 08/01/2018; patient arrives back in the clinic with the area on his right lateral foot which is inverted at the ankle totally closed. They have been using silver collagen predominantly which is close this down. This is a chronic wound that had underlying osteomyelitis. He had surgery to remove 2 metatarsals for 5 years ago by Dr. Beola Cord. At that point he refused an amputation did hyperbarics. A recent CT scan we did earlier this year did not show evidence of active osteomyelitis. We tried a wound VAC for a period of time but he could not really tolerate this. In any case this is totally closed. That outcome in itself is really quite amazing READMISSION 12/29/2019 Nathan Miller is a now 69 year old man who is in this clinic many years ago for osteomyelitis and a wound on his right lateral foot. At the time he was noted to  be an idiopathic peripheral neuropathy although I note that he since has been diagnosed with type 2 diabetes. He has a Charcot foot deformity. He had extensive orthopedic surgery on this foot as well removing under the underlying metatarsals that at the time had osteomyelitis. We followed him for a long period palliatively for a deep wound on the right lateral foot. He had refused an amputation probably 8 or 9 years ago and underwent IV antibiotics and hyperbaric oxygen. Miraculously this wound actually healed over I believe in 2019. The patient has a wound on the right lateral plantar heel which is not in the same spot as the previous wound. This is a weightbearing surface. The wound looks fairly benign but there is significant undermining. No erythema. They state that is started as a blister they used leftover silver alginate, Neosporin and trying to offload this as best he can. He has custom-made shoes with an AFO brace on the right. Past medical history is reviewed predominantly coronary artery disease, pacemaker, stent, type 2 diabetes with a recent hemoglobin A1c of 6.1. ABI in our clinic was 1.0 on the right Electronic Signature(s) Signed: 12/29/2019 5:45:55 PM By: Linton Ham MD Entered By: Linton Ham on 12/29/2019 12:31:49 -------------------------------------------------------------------------------- Physical Exam Details Patient Name: Date of Service: Nathan Miller, Nathan Miller 12/29/2019 10:30 AM Medical Record [id number] Patient Account Number: [id number] Date of Birth/Sex: Treating RN: Apr 16, 1951 (69 y.o. Nathan Miller Primary Care Provider: PATIENT, NO Other Clinician: Referring Provider: Treating Provider/Extender:Musette Kisamore, Otelia Sergeant, Cherre Blanc in Treatment: 0 Constitutional Patient is hypertensive.. Pulse regular and within target range for patient.[name] Respirations regular, non-labored and within target range.. Temperature is normal and within the target range  for the patient.[name] Appears in no distress. Respiratory work of breathing is normal. Cardiovascular Needle pulses are palpable. Musculoskeletal The patient has the usual foot deformity here very swollen area extending laterally. His original wound site was reviewed there is nothing open here.. Integumentary (Hair, Skin) No primary skin disorders are seen. Psychiatric appears at normal baseline. Notes Wound exam; the area questions on the right lateral plantar heel. Fairly benign looking wound but with roughly 1.5 cm of circumferential undermining. There is no erythema no exposed bone and no tenderness. Electronic Signature(s) Signed: 12/29/2019 5:45:55 PM By: Linton Ham MD Entered By: Linton Ham on 12/29/2019 12:34:06 -------------------------------------------------------------------------------- Physician Orders Details Patient Name: Date of Service: [name], [name] 12/29/2019 10:30 AM Medical Record [id number] Patient Account Number: [id number] Date of Birth/Sex: Treating RN:  IZAAK, ARMS ([id number]) Visit Report for 12/29/2019 Chief Complaint Document Details Patient Name: Date of Service: RENAULD, STREBECK 12/29/2019 10:30 AM Medical Record [id number] Patient Account Number: [id number] Date of Birth/Sex: Treating RN: 1951/04/20 (69 y.o. Nathan Miller Primary Care Provider: PATIENT, NO Other Clinician: Referring Provider: Treating Provider/Extender:Yanci Bachtell, Otelia Sergeant, Cherre Blanc in Treatment: 0 Information Obtained from: Patient Chief Complaint this patient has a chronic wound in his right lateral foot with chronic osteomyelitis chronic edema. This is been present roughly 6 years. He had extensive surgery on the right foot roughly 5 years ago to remove I believe 3 metatarsals. He finished IV any biotic's and HBO. He was offered an amputation but refused 12/29/2019; patient is here for review of the wound on the right lateral plantar heel Electronic Signature(s) Signed: 12/29/2019 5:45:55 PM By: Linton Ham MD Entered By: Linton Ham on 12/29/2019 12:09:45 -------------------------------------------------------------------------------- Debridement Details Patient Name: Date of Service: [name] 12/29/2019 10:30 AM Medical Record [id number] Patient Account Number: [id number] Date of Birth/Sex: 1951/02/25 (68 y.o. M) Treating RN: Levan Hurst Primary Care Provider: PATIENT, NO Other Clinician: Referring Provider: Treating Provider/Extender:Selmer Adduci, Otelia Sergeant, Cherre Blanc in Treatment: 0 Debridement Performed for Wound #3 Right,Plantar Calcaneus Assessment: Performed By: Physician Ricard Dillon., MD Debridement Type: Debridement Severity of Tissue Pre Fat layer exposed Debridement: Level of Consciousness (Pre- Awake and Alert procedure): Pre-procedure Verification/Time Out Taken: Yes - 11:25 Start Time: 11:25 Total Area Debrided (L x W): 1.6 (cm) x 1.3 (cm) = 2.08 (cm) Tissue and other material Tissue and  other material Viable, Non-Viable, Callus, Subcutaneous debrided: Level: Skin/Subcutaneous Tissue Debridement Description: Excisional Instrument: Blade, Curette, Forceps Bleeding: Moderate Hemostasis Achieved: Silver Nitrate End Time: 11:27 Procedural Pain: 0 Post Procedural Pain: 0 Response to Treatment: Procedure was tolerated well Level of Consciousness Awake and Alert (Post-procedure): Post Debridement Measurements of Total Wound Length: (cm) 1.6 Width: (cm) 1.3 Depth: (cm) 0.6 Volume: (cm) 0.98 Character of Wound/Ulcer Post Improved Debridement: Severity of Tissue Post Debridement: Fat layer exposed Post Procedure Diagnosis Same as Pre-procedure Electronic Signature(s) Signed: 12/29/2019 5:45:55 PM By: Linton Ham MD Signed: 12/29/2019 5:57:14 PM By: Levan Hurst RN, BSN Entered By: Linton Ham on 12/29/2019 12:09:08 -------------------------------------------------------------------------------- HPI Details Patient Name: Date of Service: [name] 12/29/2019 10:30 AM Medical Record [id number] Patient Account Number: [id number] Date of Birth/Sex: Treating RN: 1951/10/26 (69 y.o. Nathan Miller Primary Care Provider: PATIENT, NO Other Clinician: Referring Provider: Treating Provider/Extender:Jalaina Salyers, Otelia Sergeant, Cherre Blanc in Treatment: 0 History of Present Illness Location: right foot Severity: Wag 3 Duration: #months Modifying Factors: Diabetic and charcot' foot.. Peripheral vascular disease Associated Signs and Symptoms: osteo HPI Description: see chief complaint. 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 This is been present for 5-6 years. He underwent a course of IV any biotics and hyperbaric oxygen roughly 5 years ago. He was offered an amputation by orthopedics and I believe at the time I concurred with this however the patient  wishes to up go via wait-and-see approach. He is actually done fairly well over the 5 years is still ambulatory. He uses a silver alginate-based dressing on the foot which he changes himself up. 05/27/15; patient returns for his monthly visit the. The status of his foot and his wound are essentially unchanged this patient has a chronic idiopathic neuropathy. He has underlying osteomyelitis. He had extensive orthopedic surgery roughly 3 or 4 years  Account Number: [id number] Date of Birth/Sex: Treating RN: 07-04-1951 (69 y.o. 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, right ankle and foot Facility Procedures CPT4 Code Description: [id number] - WOUND CARE VISIT-LEV 3 EST PT Modifier: 25 Quantity: 1 CPT4 Code Description: [id number] - DEB SUBQ TISSUE 20 SQ CM/< ICD-10 Diagnosis Description E11.621 Type 2 diabetes mellitus with foot ulcer 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 of other part of right foot with Modifier: fat layer expos Quantity: 1 ed Physician Procedures CPT4 Code Description: [id number] - WC PHYS LEVEL 4 - EST PT ICD-10 Diagnosis Description E11.621 Type 2 diabetes mellitus with foot ulcer 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 of  other part of right foot w E11.42 Type 2 diabetes mellitus with diabetic  polyneuropathy 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  Mar 14, 1951 (69 y.o. Nathan Miller Primary Care Provider: PATIENT, NO Other Clinician: Referring Provider: Treating Provider/Extender:Havanna Groner, Otelia Sergeant, Cherre Blanc in Treatment: 0 Verbal / Phone Orders: No Diagnosis Coding Follow-up Appointments Return Appointment in 2 weeks. Dressing Change Frequency Wound #3 Right,Plantar Calcaneus Change Dressing every other day. Wound Cleansing Wound #3 Right,Plantar Calcaneus May shower and wash wound with soap and water. - on days that dressing is changed Primary Wound Dressing Wound #3 Right,Plantar Calcaneus Calcium Alginate with Silver Secondary Dressing Wound #3 Right,Plantar Calcaneus Foam - foam donut Kerlix/Rolled Gauze - secure with tape Dry Gauze Off-Loading Turn and reposition every 2 hours Other: - float heels off of bed/chair with pillow under calves Electronic Signature(s) Signed: 12/29/2019 5:45:55 PM By: Linton Ham MD Signed: 12/29/2019 5:57:14 PM By: Levan Hurst RN, BSN Entered By: Levan Hurst on 12/29/2019 11:31:30 -------------------------------------------------------------------------------- Problem List Details Patient Name: Date of Service: [name] 12/29/2019 10:30 AM Medical Record [id number] Patient Account Number: [id number] Date of Birth/Sex: Treating RN: 1951-08-23 (69 y.o. 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 12/29/2019 10:30 AM Medical Record [id number] Patient Account Number: [id number] Date of Birth/Sex: Treating RN: 28-Jul-1951 (69 y.o. Nathan Miller Primary Care Provider: PATIENT, NO Other Clinician: Referring Provider: Treating Provider/Extender:Jeremian Whitby, Otelia Sergeant, Cherre Blanc in Treatment: 0 Subjective Chief Complaint Information obtained from Patient this patient has a chronic wound in his right lateral foot with chronic osteomyelitis chronic edema. This is been present roughly 6 years. He had extensive surgery on the right foot roughly 5 years ago to remove I believe 3 metatarsals. He finished IV any  biotic's and HBO. He was offered an amputation but refused 12/29/2019; patient is here for review of the wound on the right lateral plantar heel History of Present Illness (HPI) The following HPI elements were documented for the patient's wound: Location: right foot Severity: Wag 3 Duration: #months Modifying Factors: Diabetic and charcot' foot.. Peripheral vascular disease Associated Signs and Symptoms: osteo see chief complaint. 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 This is been present for 5-6 years. He underwent a course of IV any biotics and hyperbaric oxygen roughly 5 years ago. He was offered an amputation by orthopedics and I believe at the time I concurred with this however the patient wishes to up go via wait-and-see approach. He is actually done fairly well over the 5 years is still ambulatory. He uses a silver alginate-based dressing on the foot which he changes himself up. 05/27/15; patient returns for his monthly visit the. The status of his foot and his wound are essentially unchanged this patient has a chronic idiopathic neuropathy. He has underlying osteomyelitis. He had extensive orthopedic surgery roughly 3 or 4 years ago had. He has underlying chronic osteomyelitis.[name] He dresses this with silver alginate.  Mar 14, 1951 (69 y.o. Nathan Miller Primary Care Provider: PATIENT, NO Other Clinician: Referring Provider: Treating Provider/Extender:Havanna Groner, Otelia Sergeant, Cherre Blanc in Treatment: 0 Verbal / Phone Orders: No Diagnosis Coding Follow-up Appointments Return Appointment in 2 weeks. Dressing Change Frequency Wound #3 Right,Plantar Calcaneus Change Dressing every other day. Wound Cleansing Wound #3 Right,Plantar Calcaneus May shower and wash wound with soap and water. - on days that dressing is changed Primary Wound Dressing Wound #3 Right,Plantar Calcaneus Calcium Alginate with Silver Secondary Dressing Wound #3 Right,Plantar Calcaneus Foam - foam donut Kerlix/Rolled Gauze - secure with tape Dry Gauze Off-Loading Turn and reposition every 2 hours Other: - float heels off of bed/chair with pillow under calves Electronic Signature(s) Signed: 12/29/2019 5:45:55 PM By: Linton Ham MD Signed: 12/29/2019 5:57:14 PM By: Levan Hurst RN, BSN Entered By: Levan Hurst on 12/29/2019 11:31:30 -------------------------------------------------------------------------------- Problem List Details Patient Name: Date of Service: [name] 12/29/2019 10:30 AM Medical Record [id number] Patient Account Number: [id number] Date of Birth/Sex: Treating RN: 1951-08-23 (69 y.o. 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 12/29/2019 10:30 AM Medical Record [id number] Patient Account Number: [id number] Date of Birth/Sex: Treating RN: 28-Jul-1951 (69 y.o. Nathan Miller Primary Care Provider: PATIENT, NO Other Clinician: Referring Provider: Treating Provider/Extender:Jeremian Whitby, Otelia Sergeant, Cherre Blanc in Treatment: 0 Subjective Chief Complaint Information obtained from Patient this patient has a chronic wound in his right lateral foot with chronic osteomyelitis chronic edema. This is been present roughly 6 years. He had extensive surgery on the right foot roughly 5 years ago to remove I believe 3 metatarsals. He finished IV any  biotic's and HBO. He was offered an amputation but refused 12/29/2019; patient is here for review of the wound on the right lateral plantar heel History of Present Illness (HPI) The following HPI elements were documented for the patient's wound: Location: right foot Severity: Wag 3 Duration: #months Modifying Factors: Diabetic and charcot' foot.. Peripheral vascular disease Associated Signs and Symptoms: osteo see chief complaint. 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 This is been present for 5-6 years. He underwent a course of IV any biotics and hyperbaric oxygen roughly 5 years ago. He was offered an amputation by orthopedics and I believe at the time I concurred with this however the patient wishes to up go via wait-and-see approach. He is actually done fairly well over the 5 years is still ambulatory. He uses a silver alginate-based dressing on the foot which he changes himself up. 05/27/15; patient returns for his monthly visit the. The status of his foot and his wound are essentially unchanged this patient has a chronic idiopathic neuropathy. He has underlying osteomyelitis. He had extensive orthopedic surgery roughly 3 or 4 years ago had. He has underlying chronic osteomyelitis.[name] He dresses this with silver alginate.  float heels off of bed/chair with pillow under calves 1. There is circumference of the wound was debrided of callus skin. Then the surface of the wound was debrided of fibrinous debris. Hemostasis with silver alginate and direct pressure 2. We will use silver alginate 3. The problem here is going to be offloading this area sufficiently for healing. His ankle is unstable when walking tending to evert at the ankle. This is the reason for the AFO brace. The only thing I can really think of is a total contact cast and I am not really certain that he would be able to tolerate that either although I did bring this up with him. In the meantime I have asked him to continue to offload this area by keeping off it as much as possible 4. At this point no x-rays or imaging we will see how this wound is with aggressive attempts at the patient offload Electronic Signature(s) Signed: 12/29/2019 5:45:55 PM By: Linton Ham MD Entered By: Linton Ham on 12/29/2019 12:36:07 -------------------------------------------------------------------------------- HxROS Details Patient Name: Date of Service: [name] 12/29/2019 10:30 AM Medical Record [id number] Patient Account Number: [id number] Date of Birth/Sex: Treating RN: 09-26-1951 (69 y.o. Ernestene Mention Primary Care Provider: PATIENT, NO Other Clinician: Referring Provider: Treating Provider/Extender:Cambre Matson, Otelia Sergeant, Cherre Blanc in Treatment: 0 Information Obtained From Patient Constitutional Symptoms (General Health) Complaints and Symptoms: Negative for: Fatigue; Fever; Chills; Marked Weight Change Medical History: Past Medical History Notes: obesity Eyes Complaints and Symptoms: Positive for: Glasses / Contacts - reading Negative for: Dry Eyes; Vision  Changes Ear/Nose/Mouth/Throat Complaints and Symptoms: Negative for: Chronic sinus problems or rhinitis Respiratory Complaints and Symptoms: Positive for: Shortness of Breath Gastrointestinal Complaints and Symptoms: Negative for: Frequent diarrhea; Nausea; Vomiting Medical History: Past Medical History Notes: diverticulosis, umbilical hernia Genitourinary Complaints and Symptoms: Negative for: Frequent urination Medical History: Negative for: End Stage Renal Disease Integumentary (Skin) Complaints and Symptoms: Positive for: Wounds - right heel Medical History: Negative for: History of Burn Neurologic Complaints and Symptoms: Positive for: Numbness/parasthesias Medical History: Positive for: Neuropathy Psychiatric Complaints and Symptoms: Positive for: Claustrophobia Negative for: Suicidal Medical History: Positive for: Confinement Anxiety Negative for: Anorexia/bulimia Hematologic/Lymphatic Cardiovascular Medical History: Positive for: Coronary Artery Disease; Hypertension Past Medical History Notes: complete heart block, pacemaker Endocrine Medical History: Positive for: Type II Diabetes Negative for: Type I Diabetes Time with diabetes: 5 yrs Treated with: Oral agents Blood sugar tested every day: Yes Tested : once Blood sugar testing results: Breakfast: 90-110 Immunological Musculoskeletal Medical History: Positive for: Osteoarthritis; Osteomyelitis - right foot Negative for: Gout; Rheumatoid Arthritis Oncologic Medical History: Negative for: Received Chemotherapy; Received Radiation Immunizations Pneumococcal Vaccine: Received Pneumococcal Vaccination: Yes Implantable Devices Yes Hospitalization / Surgery History Type of Hospitalization/Surgery pacemaker placement IandD right loot Family and Social History Cancer: No; Diabetes: Yes - Father,Paternal Grandparents; Heart Disease: Yes - Father,Paternal Grandparents; Hereditary Spherocytosis: No;  Hypertension: Yes - Father,Paternal Grandparents; Kidney Disease: Yes - Father; Lung Disease: No; Seizures: No; Stroke: No; Thyroid Problems: No; Tuberculosis: No; Former smoker - uses chewing tobacco; Marital Status - Married; Alcohol Use: Never; Drug Use: Prior History - TCH; Caffeine Use: Moderate - soda; Financial Concerns: No; Food, Clothing or Shelter Needs: No; Support System Lacking: No; Transportation Concerns: No Engineer, maintenance) Signed: 12/29/2019 5:45:55 PM By: Linton Ham MD Signed: 12/30/2019 4:52:50 PM By: Baruch Gouty RN, BSN Entered By: Baruch Gouty on 12/29/2019 11:04:48 -------------------------------------------------------------------------------- SuperBill Details Patient Name: Date of Service: TYRIAN, INFANTE 12/29/2019 Medical Record [id number] Patient  IZAAK, ARMS ([id number]) Visit Report for 12/29/2019 Chief Complaint Document Details Patient Name: Date of Service: RENAULD, STREBECK 12/29/2019 10:30 AM Medical Record [id number] Patient Account Number: [id number] Date of Birth/Sex: Treating RN: 1951/04/20 (69 y.o. Nathan Miller Primary Care Provider: PATIENT, NO Other Clinician: Referring Provider: Treating Provider/Extender:Yanci Bachtell, Otelia Sergeant, Cherre Blanc in Treatment: 0 Information Obtained from: Patient Chief Complaint this patient has a chronic wound in his right lateral foot with chronic osteomyelitis chronic edema. This is been present roughly 6 years. He had extensive surgery on the right foot roughly 5 years ago to remove I believe 3 metatarsals. He finished IV any biotic's and HBO. He was offered an amputation but refused 12/29/2019; patient is here for review of the wound on the right lateral plantar heel Electronic Signature(s) Signed: 12/29/2019 5:45:55 PM By: Linton Ham MD Entered By: Linton Ham on 12/29/2019 12:09:45 -------------------------------------------------------------------------------- Debridement Details Patient Name: Date of Service: [name] 12/29/2019 10:30 AM Medical Record [id number] Patient Account Number: [id number] Date of Birth/Sex: 1951/02/25 (68 y.o. M) Treating RN: Levan Hurst Primary Care Provider: PATIENT, NO Other Clinician: Referring Provider: Treating Provider/Extender:Selmer Adduci, Otelia Sergeant, Cherre Blanc in Treatment: 0 Debridement Performed for Wound #3 Right,Plantar Calcaneus Assessment: Performed By: Physician Ricard Dillon., MD Debridement Type: Debridement Severity of Tissue Pre Fat layer exposed Debridement: Level of Consciousness (Pre- Awake and Alert procedure): Pre-procedure Verification/Time Out Taken: Yes - 11:25 Start Time: 11:25 Total Area Debrided (L x W): 1.6 (cm) x 1.3 (cm) = 2.08 (cm) Tissue and other material Tissue and  other material Viable, Non-Viable, Callus, Subcutaneous debrided: Level: Skin/Subcutaneous Tissue Debridement Description: Excisional Instrument: Blade, Curette, Forceps Bleeding: Moderate Hemostasis Achieved: Silver Nitrate End Time: 11:27 Procedural Pain: 0 Post Procedural Pain: 0 Response to Treatment: Procedure was tolerated well Level of Consciousness Awake and Alert (Post-procedure): Post Debridement Measurements of Total Wound Length: (cm) 1.6 Width: (cm) 1.3 Depth: (cm) 0.6 Volume: (cm) 0.98 Character of Wound/Ulcer Post Improved Debridement: Severity of Tissue Post Debridement: Fat layer exposed Post Procedure Diagnosis Same as Pre-procedure Electronic Signature(s) Signed: 12/29/2019 5:45:55 PM By: Linton Ham MD Signed: 12/29/2019 5:57:14 PM By: Levan Hurst RN, BSN Entered By: Linton Ham on 12/29/2019 12:09:08 -------------------------------------------------------------------------------- HPI Details Patient Name: Date of Service: [name] 12/29/2019 10:30 AM Medical Record [id number] Patient Account Number: [id number] Date of Birth/Sex: Treating RN: 1951/10/26 (69 y.o. Nathan Miller Primary Care Provider: PATIENT, NO Other Clinician: Referring Provider: Treating Provider/Extender:Jalaina Salyers, Otelia Sergeant, Cherre Blanc in Treatment: 0 History of Present Illness Location: right foot Severity: Wag 3 Duration: #months Modifying Factors: Diabetic and charcot' foot.. Peripheral vascular disease Associated Signs and Symptoms: osteo HPI Description: see chief complaint. 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 This is been present for 5-6 years. He underwent a course of IV any biotics and hyperbaric oxygen roughly 5 years ago. He was offered an amputation by orthopedics and I believe at the time I concurred with this however the patient  wishes to up go via wait-and-see approach. He is actually done fairly well over the 5 years is still ambulatory. He uses a silver alginate-based dressing on the foot which he changes himself up. 05/27/15; patient returns for his monthly visit the. The status of his foot and his wound are essentially unchanged this patient has a chronic idiopathic neuropathy. He has underlying osteomyelitis. He had extensive orthopedic surgery roughly 3 or 4 years  Mar 14, 1951 (69 y.o. Nathan Miller Primary Care Provider: PATIENT, NO Other Clinician: Referring Provider: Treating Provider/Extender:Havanna Groner, Otelia Sergeant, Cherre Blanc in Treatment: 0 Verbal / Phone Orders: No Diagnosis Coding Follow-up Appointments Return Appointment in 2 weeks. Dressing Change Frequency Wound #3 Right,Plantar Calcaneus Change Dressing every other day. Wound Cleansing Wound #3 Right,Plantar Calcaneus May shower and wash wound with soap and water. - on days that dressing is changed Primary Wound Dressing Wound #3 Right,Plantar Calcaneus Calcium Alginate with Silver Secondary Dressing Wound #3 Right,Plantar Calcaneus Foam - foam donut Kerlix/Rolled Gauze - secure with tape Dry Gauze Off-Loading Turn and reposition every 2 hours Other: - float heels off of bed/chair with pillow under calves Electronic Signature(s) Signed: 12/29/2019 5:45:55 PM By: Linton Ham MD Signed: 12/29/2019 5:57:14 PM By: Levan Hurst RN, BSN Entered By: Levan Hurst on 12/29/2019 11:31:30 -------------------------------------------------------------------------------- Problem List Details Patient Name: Date of Service: [name] 12/29/2019 10:30 AM Medical Record [id number] Patient Account Number: [id number] Date of Birth/Sex: Treating RN: 1951-08-23 (69 y.o. 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 12/29/2019 10:30 AM Medical Record [id number] Patient Account Number: [id number] Date of Birth/Sex: Treating RN: 28-Jul-1951 (69 y.o. Nathan Miller Primary Care Provider: PATIENT, NO Other Clinician: Referring Provider: Treating Provider/Extender:Jeremian Whitby, Otelia Sergeant, Cherre Blanc in Treatment: 0 Subjective Chief Complaint Information obtained from Patient this patient has a chronic wound in his right lateral foot with chronic osteomyelitis chronic edema. This is been present roughly 6 years. He had extensive surgery on the right foot roughly 5 years ago to remove I believe 3 metatarsals. He finished IV any  biotic's and HBO. He was offered an amputation but refused 12/29/2019; patient is here for review of the wound on the right lateral plantar heel History of Present Illness (HPI) The following HPI elements were documented for the patient's wound: Location: right foot Severity: Wag 3 Duration: #months Modifying Factors: Diabetic and charcot' foot.. Peripheral vascular disease Associated Signs and Symptoms: osteo see chief complaint. 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 This is been present for 5-6 years. He underwent a course of IV any biotics and hyperbaric oxygen roughly 5 years ago. He was offered an amputation by orthopedics and I believe at the time I concurred with this however the patient wishes to up go via wait-and-see approach. He is actually done fairly well over the 5 years is still ambulatory. He uses a silver alginate-based dressing on the foot which he changes himself up. 05/27/15; patient returns for his monthly visit the. The status of his foot and his wound are essentially unchanged this patient has a chronic idiopathic neuropathy. He has underlying osteomyelitis. He had extensive orthopedic surgery roughly 3 or 4 years ago had. He has underlying chronic osteomyelitis.[name] He dresses this with silver alginate.  Mar 14, 1951 (69 y.o. Nathan Miller Primary Care Provider: PATIENT, NO Other Clinician: Referring Provider: Treating Provider/Extender:Havanna Groner, Otelia Sergeant, Cherre Blanc in Treatment: 0 Verbal / Phone Orders: No Diagnosis Coding Follow-up Appointments Return Appointment in 2 weeks. Dressing Change Frequency Wound #3 Right,Plantar Calcaneus Change Dressing every other day. Wound Cleansing Wound #3 Right,Plantar Calcaneus May shower and wash wound with soap and water. - on days that dressing is changed Primary Wound Dressing Wound #3 Right,Plantar Calcaneus Calcium Alginate with Silver Secondary Dressing Wound #3 Right,Plantar Calcaneus Foam - foam donut Kerlix/Rolled Gauze - secure with tape Dry Gauze Off-Loading Turn and reposition every 2 hours Other: - float heels off of bed/chair with pillow under calves Electronic Signature(s) Signed: 12/29/2019 5:45:55 PM By: Linton Ham MD Signed: 12/29/2019 5:57:14 PM By: Levan Hurst RN, BSN Entered By: Levan Hurst on 12/29/2019 11:31:30 -------------------------------------------------------------------------------- Problem List Details Patient Name: Date of Service: [name] 12/29/2019 10:30 AM Medical Record [id number] Patient Account Number: [id number] Date of Birth/Sex: Treating RN: 1951-08-23 (69 y.o. 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 12/29/2019 10:30 AM Medical Record [id number] Patient Account Number: [id number] Date of Birth/Sex: Treating RN: 28-Jul-1951 (69 y.o. Nathan Miller Primary Care Provider: PATIENT, NO Other Clinician: Referring Provider: Treating Provider/Extender:Jeremian Whitby, Otelia Sergeant, Cherre Blanc in Treatment: 0 Subjective Chief Complaint Information obtained from Patient this patient has a chronic wound in his right lateral foot with chronic osteomyelitis chronic edema. This is been present roughly 6 years. He had extensive surgery on the right foot roughly 5 years ago to remove I believe 3 metatarsals. He finished IV any  biotic's and HBO. He was offered an amputation but refused 12/29/2019; patient is here for review of the wound on the right lateral plantar heel History of Present Illness (HPI) The following HPI elements were documented for the patient's wound: Location: right foot Severity: Wag 3 Duration: #months Modifying Factors: Diabetic and charcot' foot.. Peripheral vascular disease Associated Signs and Symptoms: osteo see chief complaint. 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 This is been present for 5-6 years. He underwent a course of IV any biotics and hyperbaric oxygen roughly 5 years ago. He was offered an amputation by orthopedics and I believe at the time I concurred with this however the patient wishes to up go via wait-and-see approach. He is actually done fairly well over the 5 years is still ambulatory. He uses a silver alginate-based dressing on the foot which he changes himself up. 05/27/15; patient returns for his monthly visit the. The status of his foot and his wound are essentially unchanged this patient has a chronic idiopathic neuropathy. He has underlying osteomyelitis. He had extensive orthopedic surgery roughly 3 or 4 years ago had. He has underlying chronic osteomyelitis.[name] He dresses this with silver alginate.  IZAAK, ARMS ([id number]) Visit Report for 12/29/2019 Chief Complaint Document Details Patient Name: Date of Service: RENAULD, STREBECK 12/29/2019 10:30 AM Medical Record [id number] Patient Account Number: [id number] Date of Birth/Sex: Treating RN: 1951/04/20 (69 y.o. Nathan Miller Primary Care Provider: PATIENT, NO Other Clinician: Referring Provider: Treating Provider/Extender:Yanci Bachtell, Otelia Sergeant, Cherre Blanc in Treatment: 0 Information Obtained from: Patient Chief Complaint this patient has a chronic wound in his right lateral foot with chronic osteomyelitis chronic edema. This is been present roughly 6 years. He had extensive surgery on the right foot roughly 5 years ago to remove I believe 3 metatarsals. He finished IV any biotic's and HBO. He was offered an amputation but refused 12/29/2019; patient is here for review of the wound on the right lateral plantar heel Electronic Signature(s) Signed: 12/29/2019 5:45:55 PM By: Linton Ham MD Entered By: Linton Ham on 12/29/2019 12:09:45 -------------------------------------------------------------------------------- Debridement Details Patient Name: Date of Service: [name] 12/29/2019 10:30 AM Medical Record [id number] Patient Account Number: [id number] Date of Birth/Sex: 1951/02/25 (68 y.o. M) Treating RN: Levan Hurst Primary Care Provider: PATIENT, NO Other Clinician: Referring Provider: Treating Provider/Extender:Selmer Adduci, Otelia Sergeant, Cherre Blanc in Treatment: 0 Debridement Performed for Wound #3 Right,Plantar Calcaneus Assessment: Performed By: Physician Ricard Dillon., MD Debridement Type: Debridement Severity of Tissue Pre Fat layer exposed Debridement: Level of Consciousness (Pre- Awake and Alert procedure): Pre-procedure Verification/Time Out Taken: Yes - 11:25 Start Time: 11:25 Total Area Debrided (L x W): 1.6 (cm) x 1.3 (cm) = 2.08 (cm) Tissue and other material Tissue and  other material Viable, Non-Viable, Callus, Subcutaneous debrided: Level: Skin/Subcutaneous Tissue Debridement Description: Excisional Instrument: Blade, Curette, Forceps Bleeding: Moderate Hemostasis Achieved: Silver Nitrate End Time: 11:27 Procedural Pain: 0 Post Procedural Pain: 0 Response to Treatment: Procedure was tolerated well Level of Consciousness Awake and Alert (Post-procedure): Post Debridement Measurements of Total Wound Length: (cm) 1.6 Width: (cm) 1.3 Depth: (cm) 0.6 Volume: (cm) 0.98 Character of Wound/Ulcer Post Improved Debridement: Severity of Tissue Post Debridement: Fat layer exposed Post Procedure Diagnosis Same as Pre-procedure Electronic Signature(s) Signed: 12/29/2019 5:45:55 PM By: Linton Ham MD Signed: 12/29/2019 5:57:14 PM By: Levan Hurst RN, BSN Entered By: Linton Ham on 12/29/2019 12:09:08 -------------------------------------------------------------------------------- HPI Details Patient Name: Date of Service: [name] 12/29/2019 10:30 AM Medical Record [id number] Patient Account Number: [id number] Date of Birth/Sex: Treating RN: 1951/10/26 (69 y.o. Nathan Miller Primary Care Provider: PATIENT, NO Other Clinician: Referring Provider: Treating Provider/Extender:Jalaina Salyers, Otelia Sergeant, Cherre Blanc in Treatment: 0 History of Present Illness Location: right foot Severity: Wag 3 Duration: #months Modifying Factors: Diabetic and charcot' foot.. Peripheral vascular disease Associated Signs and Symptoms: osteo HPI Description: see chief complaint. 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 This is been present for 5-6 years. He underwent a course of IV any biotics and hyperbaric oxygen roughly 5 years ago. He was offered an amputation by orthopedics and I believe at the time I concurred with this however the patient  wishes to up go via wait-and-see approach. He is actually done fairly well over the 5 years is still ambulatory. He uses a silver alginate-based dressing on the foot which he changes himself up. 05/27/15; patient returns for his monthly visit the. The status of his foot and his wound are essentially unchanged this patient has a chronic idiopathic neuropathy. He has underlying osteomyelitis. He had extensive orthopedic surgery roughly 3 or 4 years  float heels off of bed/chair with pillow under calves 1. There is circumference of the wound was debrided of callus skin. Then the surface of the wound was debrided of fibrinous debris. Hemostasis with silver alginate and direct pressure 2. We will use silver alginate 3. The problem here is going to be offloading this area sufficiently for healing. His ankle is unstable when walking tending to evert at the ankle. This is the reason for the AFO brace. The only thing I can really think of is a total contact cast and I am not really certain that he would be able to tolerate that either although I did bring this up with him. In the meantime I have asked him to continue to offload this area by keeping off it as much as possible 4. At this point no x-rays or imaging we will see how this wound is with aggressive attempts at the patient offload Electronic Signature(s) Signed: 12/29/2019 5:45:55 PM By: Linton Ham MD Entered By: Linton Ham on 12/29/2019 12:36:07 -------------------------------------------------------------------------------- HxROS Details Patient Name: Date of Service: [name] 12/29/2019 10:30 AM Medical Record [id number] Patient Account Number: [id number] Date of Birth/Sex: Treating RN: 09-26-1951 (69 y.o. Ernestene Mention Primary Care Provider: PATIENT, NO Other Clinician: Referring Provider: Treating Provider/Extender:Cambre Matson, Otelia Sergeant, Cherre Blanc in Treatment: 0 Information Obtained From Patient Constitutional Symptoms (General Health) Complaints and Symptoms: Negative for: Fatigue; Fever; Chills; Marked Weight Change Medical History: Past Medical History Notes: obesity Eyes Complaints and Symptoms: Positive for: Glasses / Contacts - reading Negative for: Dry Eyes; Vision  Changes Ear/Nose/Mouth/Throat Complaints and Symptoms: Negative for: Chronic sinus problems or rhinitis Respiratory Complaints and Symptoms: Positive for: Shortness of Breath Gastrointestinal Complaints and Symptoms: Negative for: Frequent diarrhea; Nausea; Vomiting Medical History: Past Medical History Notes: diverticulosis, umbilical hernia Genitourinary Complaints and Symptoms: Negative for: Frequent urination Medical History: Negative for: End Stage Renal Disease Integumentary (Skin) Complaints and Symptoms: Positive for: Wounds - right heel Medical History: Negative for: History of Burn Neurologic Complaints and Symptoms: Positive for: Numbness/parasthesias Medical History: Positive for: Neuropathy Psychiatric Complaints and Symptoms: Positive for: Claustrophobia Negative for: Suicidal Medical History: Positive for: Confinement Anxiety Negative for: Anorexia/bulimia Hematologic/Lymphatic Cardiovascular Medical History: Positive for: Coronary Artery Disease; Hypertension Past Medical History Notes: complete heart block, pacemaker Endocrine Medical History: Positive for: Type II Diabetes Negative for: Type I Diabetes Time with diabetes: 5 yrs Treated with: Oral agents Blood sugar tested every day: Yes Tested : once Blood sugar testing results: Breakfast: 90-110 Immunological Musculoskeletal Medical History: Positive for: Osteoarthritis; Osteomyelitis - right foot Negative for: Gout; Rheumatoid Arthritis Oncologic Medical History: Negative for: Received Chemotherapy; Received Radiation Immunizations Pneumococcal Vaccine: Received Pneumococcal Vaccination: Yes Implantable Devices Yes Hospitalization / Surgery History Type of Hospitalization/Surgery pacemaker placement IandD right loot Family and Social History Cancer: No; Diabetes: Yes - Father,Paternal Grandparents; Heart Disease: Yes - Father,Paternal Grandparents; Hereditary Spherocytosis: No;  Hypertension: Yes - Father,Paternal Grandparents; Kidney Disease: Yes - Father; Lung Disease: No; Seizures: No; Stroke: No; Thyroid Problems: No; Tuberculosis: No; Former smoker - uses chewing tobacco; Marital Status - Married; Alcohol Use: Never; Drug Use: Prior History - TCH; Caffeine Use: Moderate - soda; Financial Concerns: No; Food, Clothing or Shelter Needs: No; Support System Lacking: No; Transportation Concerns: No Engineer, maintenance) Signed: 12/29/2019 5:45:55 PM By: Linton Ham MD Signed: 12/30/2019 4:52:50 PM By: Baruch Gouty RN, BSN Entered By: Baruch Gouty on 12/29/2019 11:04:48 -------------------------------------------------------------------------------- SuperBill Details Patient Name: Date of Service: TYRIAN, INFANTE 12/29/2019 Medical Record [id number] Patient  float heels off of bed/chair with pillow under calves 1. There is circumference of the wound was debrided of callus skin. Then the surface of the wound was debrided of fibrinous debris. Hemostasis with silver alginate and direct pressure 2. We will use silver alginate 3. The problem here is going to be offloading this area sufficiently for healing. His ankle is unstable when walking tending to evert at the ankle. This is the reason for the AFO brace. The only thing I can really think of is a total contact cast and I am not really certain that he would be able to tolerate that either although I did bring this up with him. In the meantime I have asked him to continue to offload this area by keeping off it as much as possible 4. At this point no x-rays or imaging we will see how this wound is with aggressive attempts at the patient offload Electronic Signature(s) Signed: 12/29/2019 5:45:55 PM By: Linton Ham MD Entered By: Linton Ham on 12/29/2019 12:36:07 -------------------------------------------------------------------------------- HxROS Details Patient Name: Date of Service: [name] 12/29/2019 10:30 AM Medical Record [id number] Patient Account Number: [id number] Date of Birth/Sex: Treating RN: 09-26-1951 (69 y.o. Ernestene Mention Primary Care Provider: PATIENT, NO Other Clinician: Referring Provider: Treating Provider/Extender:Cambre Matson, Otelia Sergeant, Cherre Blanc in Treatment: 0 Information Obtained From Patient Constitutional Symptoms (General Health) Complaints and Symptoms: Negative for: Fatigue; Fever; Chills; Marked Weight Change Medical History: Past Medical History Notes: obesity Eyes Complaints and Symptoms: Positive for: Glasses / Contacts - reading Negative for: Dry Eyes; Vision  Changes Ear/Nose/Mouth/Throat Complaints and Symptoms: Negative for: Chronic sinus problems or rhinitis Respiratory Complaints and Symptoms: Positive for: Shortness of Breath Gastrointestinal Complaints and Symptoms: Negative for: Frequent diarrhea; Nausea; Vomiting Medical History: Past Medical History Notes: diverticulosis, umbilical hernia Genitourinary Complaints and Symptoms: Negative for: Frequent urination Medical History: Negative for: End Stage Renal Disease Integumentary (Skin) Complaints and Symptoms: Positive for: Wounds - right heel Medical History: Negative for: History of Burn Neurologic Complaints and Symptoms: Positive for: Numbness/parasthesias Medical History: Positive for: Neuropathy Psychiatric Complaints and Symptoms: Positive for: Claustrophobia Negative for: Suicidal Medical History: Positive for: Confinement Anxiety Negative for: Anorexia/bulimia Hematologic/Lymphatic Cardiovascular Medical History: Positive for: Coronary Artery Disease; Hypertension Past Medical History Notes: complete heart block, pacemaker Endocrine Medical History: Positive for: Type II Diabetes Negative for: Type I Diabetes Time with diabetes: 5 yrs Treated with: Oral agents Blood sugar tested every day: Yes Tested : once Blood sugar testing results: Breakfast: 90-110 Immunological Musculoskeletal Medical History: Positive for: Osteoarthritis; Osteomyelitis - right foot Negative for: Gout; Rheumatoid Arthritis Oncologic Medical History: Negative for: Received Chemotherapy; Received Radiation Immunizations Pneumococcal Vaccine: Received Pneumococcal Vaccination: Yes Implantable Devices Yes Hospitalization / Surgery History Type of Hospitalization/Surgery pacemaker placement IandD right loot Family and Social History Cancer: No; Diabetes: Yes - Father,Paternal Grandparents; Heart Disease: Yes - Father,Paternal Grandparents; Hereditary Spherocytosis: No;  Hypertension: Yes - Father,Paternal Grandparents; Kidney Disease: Yes - Father; Lung Disease: No; Seizures: No; Stroke: No; Thyroid Problems: No; Tuberculosis: No; Former smoker - uses chewing tobacco; Marital Status - Married; Alcohol Use: Never; Drug Use: Prior History - TCH; Caffeine Use: Moderate - soda; Financial Concerns: No; Food, Clothing or Shelter Needs: No; Support System Lacking: No; Transportation Concerns: No Engineer, maintenance) Signed: 12/29/2019 5:45:55 PM By: Linton Ham MD Signed: 12/30/2019 4:52:50 PM By: Baruch Gouty RN, BSN Entered By: Baruch Gouty on 12/29/2019 11:04:48 -------------------------------------------------------------------------------- SuperBill Details Patient Name: Date of Service: TYRIAN, INFANTE 12/29/2019 Medical Record [id number] Patient  Account Number: [id number] Date of Birth/Sex: Treating RN: 07-04-1951 (69 y.o. 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, right ankle and foot Facility Procedures CPT4 Code Description: [id number] - WOUND CARE VISIT-LEV 3 EST PT Modifier: 25 Quantity: 1 CPT4 Code Description: [id number] - DEB SUBQ TISSUE 20 SQ CM/< ICD-10 Diagnosis Description E11.621 Type 2 diabetes mellitus with foot ulcer 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 of other part of right foot with Modifier: fat layer expos Quantity: 1 ed Physician Procedures CPT4 Code Description: [id number] - WC PHYS LEVEL 4 - EST PT ICD-10 Diagnosis Description E11.621 Type 2 diabetes mellitus with foot ulcer 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 of  other part of right foot w E11.42 Type 2 diabetes mellitus with diabetic  polyneuropathy 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, right ankle and foot Facility Procedures CPT4 Code Description: [id number] - WOUND CARE VISIT-LEV 3 EST PT Modifier: 25 Quantity: 1 CPT4 Code Description: [id number] - DEB SUBQ TISSUE 20 SQ CM/< ICD-10 Diagnosis Description E11.621 Type 2 diabetes mellitus with foot ulcer 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 of other part of right foot with Modifier: fat layer expos Quantity: 1 ed Physician Procedures CPT4 Code Description: [id number] - WC PHYS LEVEL 4 - EST PT ICD-10 Diagnosis Description E11.621 Type 2 diabetes mellitus with foot ulcer 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 of  other part of right foot w E11.42 Type 2 diabetes mellitus with diabetic  polyneuropathy 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

## 2019-12-30 NOTE — Progress Notes (Addendum)
Level 3 Electronic Signature(s) Signed: 12/29/2019 5:57:14 PM By: Levan Hurst RN, BSN Entered By: Levan Hurst on 12/29/2019 17:20:44 -------------------------------------------------------------------------------- Encounter Discharge Information Details Patient Name: Date of Service: Nathan Miller, Nathan Miller 12/29/2019 10:30 AM Medical Record [id number] Patient Account Number: [id number] Date of Birth/Sex: Treating RN: 1951-06-09 (69 y.o. [name] Primary Care Klohe Lovering: PATIENT, NO Other Clinician: Referring [place] Oborn: Treating Blaiden Werth/Extender:Robson, Otelia Sergeant, Cherre Blanc in Treatment: 0 Encounter Discharge Information Items Post Procedure Vitals Discharge Condition: Stable Temperature (F): 97.9 Ambulatory Status: Ambulatory Pulse (bpm): 74 Discharge Destination: Home Respiratory Rate (breaths/min): 18 Transportation: Private Auto Blood Pressure (mmHg): 172/90 Accompanied By: spouse Schedule Follow-up Appointment: Yes Clinical Summary of Care: Patient Declined Electronic Signature(s) Signed: 12/29/2019 5:21:05 PM By: Kela Millin Entered By: Kela Millin on 12/29/2019 11:48:32 -------------------------------------------------------------------------------- Lower Extremity Assessment Details Patient Name: Date of Service: Nathan Miller, Nathan Miller 12/29/2019 10:30 AM Medical Record [id number] Patient Account Number: [id number] Date of Birth/Sex: Treating RN: 1951/02/06 (69 y.o. Nathan Miller Primary Care Dara Camargo: PATIENT, NO Other Clinician: Referring Hazen Brumett: Treating Rebeckah Masih/Extender:Robson, Otelia Sergeant, Cherre Blanc in Treatment: 0 Edema  Assessment Assessed: [Left: No] [Right: No] Edema: [Left: Ye] [Right: s] Calf Left: Right: Point of Measurement: cm From Medial Instep cm 41.4 cm Ankle Left: Right: Point of Measurement: cm From Medial Instep cm 28.8 cm Vascular Assessment Pulses: Dorsalis Pedis Palpable: [Right:Yes] Blood Pressure: Brachial: [Right:172] Dorsalis Pedis: 172 Ankle: Posterior Tibial: 170 Ankle Brachial Index: [Right:1.00] Electronic Signature(s) Signed: 12/30/2019 4:52:50 PM By: Baruch Gouty RN, BSN Entered By: Baruch Gouty on 12/29/2019 10:58:16 -------------------------------------------------------------------------------- Multi Wound Chart Details Patient Name: Date of Service: Nathan Eastern D. 12/29/2019 10:30 AM Medical Record [id number] Patient Account Number: [id number] Date of Birth/Sex: Treating RN: 1951-07-03 (69 y.o. Nathan Miller Primary Care Jaggar Benko: PATIENT, NO Other Clinician: Referring Theresa Dohrman: Treating Meylin Stenzel/Extender:Robson, Otelia Sergeant, Cherre Blanc in Treatment: 0 Vital Signs Height(in): 44 Pulse(bpm): 65 Weight(lbs): 253 Blood Pressure(mmHg): 172/90 Body Mass Index(BMI): 38 Temperature(F): 97.9 Respiratory 18 Rate(breaths/min): Photos: [3:No Photos] [N/A:N/A] Wound Location: [3:Right Calcaneus - Plantar N/A] Wounding Event: [3:Blister] [N/A:N/A] Primary Etiology: [3:Diabetic Wound/Ulcer of the N/A Lower Extremity] Comorbid History: [3:Coronary Artery Disease, N/A Hypertension, Type II Diabetes, Osteoarthritis, Osteomyelitis, Neuropathy, Confinement Anxiety] Date Acquired: [3:09/30/2019] [N/A:N/A] Weeks of Treatment: [3:0] [N/A:N/A] Wound Status: [3:Open] [N/A:N/A] Measurements L x W x D 1.6x1.3x0.6 [N/A:N/A] (cm) Area (cm) : [3:1.634] [N/A:N/A] Volume (cm) : [3:0.98] [N/A:N/A] % Reduction in Area: [3:0.00%] [N/A:N/A] % Reduction in Volume: 0.00% [N/A:N/A] Starting Position 1 4 (o'clock): Ending Position 1 [3:2] (o'clock): Maximum  Distance 1 [3:0.7] (cm): Undermining: [3:Yes] [N/A:N/A] Classification: [3:Grade 2] [N/A:N/A] Exudate Amount: [3:Medium] [N/A:N/A] Exudate Type: [3:Serosanguineous] [N/A:N/A] Exudate Color: [3:red, brown] [N/A:N/A] Wound Margin: [3:Thickened] [N/A:N/A] Granulation Amount: [3:Large (67-100%)] [N/A:N/A] Granulation Quality: [3:Red] [N/A:N/A] Necrotic Amount: [3:None Present (0%)] [N/A:N/A] Exposed Structures: [3:Fat Layer (Subcutaneous N/A Tissue) Exposed: Yes Fascia: No Tendon: No Muscle: No Joint: No Bone: No] Epithelialization: [3:None] [N/A:N/A] Debridement: [3:Debridement - Excisional N/A] Pre-procedure [3:11:25] [N/A:N/A] Verification/Time Out Taken: Tissue Debrided: [3:Callus, Subcutaneous] [N/A:N/A] Level: [3:Skin/Subcutaneous Tissue] [N/A:N/A] Debridement Area (sq cm):2.08 [N/A:N/A] Instrument: [3:Blade, Curette, Forceps] [N/A:N/A] Bleeding: [3:Moderate] [N/A:N/A] Hemostasis Achieved: [3:Silver Nitrate] [N/A:N/A] Procedural Pain: [3:0] [N/A:N/A] Post Procedural Pain: [3:0] [N/A:N/A] Debridement Treatment Procedure was tolerated [N/A:N/A] Response: [3:well] Post Debridement [3:1.6x1.3x0.6] [N/A:N/A] Measurements L x W x D (cm) Post Debridement [3:0.98] [N/A:N/A] Volume: (cm) Procedures Performed: Debridement [N/A:N/A] Treatment Notes Wound #3 (Right, Plantar Calcaneus) 1. Cleanse With Wound Cleanser 2. Periwound Care Skin Prep 3. Primary Dressing Applied Calcium Alginate Ag 4. Secondary Dressing  Signed: 12/29/2019 5:57:14 PM By: Levan Hurst RN, BSN Entered By: Levan Hurst on 12/29/2019 17:19:57 -------------------------------------------------------------------------------- Wound Assessment Details Patient Name: Date of Service: Nathan Miller, Nathan Miller 12/29/2019 10:30 AM Medical Record [id number] Patient Account Number: [id number] Date of Birth/Sex: Treating RN: [birth year] (69 y.o. Nathan Miller Primary Care Judah Chevere: PATIENT, NO Other Clinician: Referring Pinkie Manger: Treating Anothony Bursch/Extender:Robson, Otelia Sergeant, Cherre Blanc in Treatment: 0 Wound Status Wound Number: 3 Primary Diabetic Wound/Ulcer of the Lower Extremity Etiology: Etiology: Wound Location: Right Calcaneus - Plantar Wound Open Wounding Event: Blister Status: Date Acquired: 09/30/2019 Comorbid Coronary Artery Disease, Hypertension, Type Weeks Of Treatment: 0 History: II Diabetes, Osteoarthritis, Osteomyelitis, Clustered Wound: No Neuropathy, Confinement Anxiety Photos Wound Measurements Length: (cm) 1.6 % Reduction in Ar Width: (cm) 1.3 % Reduction in Vo Depth: (cm) 0.6 Epithelialization Area: (cm) 1.634 Undermining: Volume: (cm) 0.98 Starting Posi Ending Positio Maximum Distan ea: 0% lume: 0% : None Yes tion (o'clock): 4 n (o'clock): 2 ce: (cm) 0.7 Wound Description Classification: Grade 2  Foul Odor After C Wound Margin: Thickened Slough/Fibrino Exudate Amount: Medium Exudate Type: Serosanguineous Exudate Color: red, brown Wound Bed Granulation Amount: Large (67-100%) Granulation Quality: Red Fascia Exposed: Necrotic Amount: None Present (0%) Fat Layer (Subcut Tendon Exposed: Muscle Exposed: Joint Exposed: Bone Exposed: leansing: No No Exposed Structure No aneous Tissue) Exposed: Yes No No No No Treatment Notes Wound #3 (Right, Plantar Calcaneus) 1. Cleanse With Wound Cleanser 2. Periwound Care Skin Prep 3. Primary Dressing Applied Calcium Alginate Ag 4. Secondary Dressing Dry Gauze Roll Gauze Foam 5. Secured With Tape Notes foam donut Electronic Signature(s) Signed: 12/31/2019 4:03:04 PM By: [name] EMT/HBOT Signed: 12/31/2019 5:54:02 PM By: Levan Hurst RN, BSN Previous Signature: 12/29/2019 5:57:14 PM Version By: Levan Hurst RN, BSN Entered By: [name] on 12/31/2019 14:00:05 -------------------------------------------------------------------------------- Vitals Details Patient Name: Date of Service: Nathan Miller, Nathan Miller 12/29/2019 10:30 AM Medical Record [id number] Patient Account Number: [id number] Date of Birth/Sex: Treating RN: 1951-08-11 (69 y.o. Nathan Miller Primary Care Mrk Buzby: PATIENT, NO Other Clinician: Referring Britney Newstrom: Treating Dakarri Kessinger/Extender:Robson, Otelia Sergeant, Cherre Blanc in Treatment: 0 Vital Signs Time Taken: 10:32 Temperature (F): 97.9 Height (in): 68 Pulse (bpm): 74 Source: Stated Respiratory Rate (breaths/min): 18 Weight (lbs): 253 Blood Pressure (mmHg): 172/90 Source: Stated Reference Range: 80 - 120 mg / dl Body Mass Index (BMI): 38.5 Notes pt did not check BS this am Electronic Signature(s) Signed: 12/30/2019 4:52:50 PM By: Baruch Gouty RN, BSN Entered By: Baruch Gouty on 12/29/2019 10:34:02  Signed: 12/29/2019 5:57:14 PM By: Levan Hurst RN, BSN Entered By: Levan Hurst on 12/29/2019 17:19:57 -------------------------------------------------------------------------------- Wound Assessment Details Patient Name: Date of Service: Nathan Miller, Nathan Miller 12/29/2019 10:30 AM Medical Record [id number] Patient Account Number: [id number] Date of Birth/Sex: Treating RN: [birth year] (69 y.o. Nathan Miller Primary Care Judah Chevere: PATIENT, NO Other Clinician: Referring Pinkie Manger: Treating Anothony Bursch/Extender:Robson, Otelia Sergeant, Cherre Blanc in Treatment: 0 Wound Status Wound Number: 3 Primary Diabetic Wound/Ulcer of the Lower Extremity Etiology: Etiology: Wound Location: Right Calcaneus - Plantar Wound Open Wounding Event: Blister Status: Date Acquired: 09/30/2019 Comorbid Coronary Artery Disease, Hypertension, Type Weeks Of Treatment: 0 History: II Diabetes, Osteoarthritis, Osteomyelitis, Clustered Wound: No Neuropathy, Confinement Anxiety Photos Wound Measurements Length: (cm) 1.6 % Reduction in Ar Width: (cm) 1.3 % Reduction in Vo Depth: (cm) 0.6 Epithelialization Area: (cm) 1.634 Undermining: Volume: (cm) 0.98 Starting Posi Ending Positio Maximum Distan ea: 0% lume: 0% : None Yes tion (o'clock): 4 n (o'clock): 2 ce: (cm) 0.7 Wound Description Classification: Grade 2  Foul Odor After C Wound Margin: Thickened Slough/Fibrino Exudate Amount: Medium Exudate Type: Serosanguineous Exudate Color: red, brown Wound Bed Granulation Amount: Large (67-100%) Granulation Quality: Red Fascia Exposed: Necrotic Amount: None Present (0%) Fat Layer (Subcut Tendon Exposed: Muscle Exposed: Joint Exposed: Bone Exposed: leansing: No No Exposed Structure No aneous Tissue) Exposed: Yes No No No No Treatment Notes Wound #3 (Right, Plantar Calcaneus) 1. Cleanse With Wound Cleanser 2. Periwound Care Skin Prep 3. Primary Dressing Applied Calcium Alginate Ag 4. Secondary Dressing Dry Gauze Roll Gauze Foam 5. Secured With Tape Notes foam donut Electronic Signature(s) Signed: 12/31/2019 4:03:04 PM By: [name] EMT/HBOT Signed: 12/31/2019 5:54:02 PM By: Levan Hurst RN, BSN Previous Signature: 12/29/2019 5:57:14 PM Version By: Levan Hurst RN, BSN Entered By: [name] on 12/31/2019 14:00:05 -------------------------------------------------------------------------------- Vitals Details Patient Name: Date of Service: Nathan Miller, Nathan Miller 12/29/2019 10:30 AM Medical Record [id number] Patient Account Number: [id number] Date of Birth/Sex: Treating RN: 1951-08-11 (69 y.o. Nathan Miller Primary Care Mrk Buzby: PATIENT, NO Other Clinician: Referring Britney Newstrom: Treating Dakarri Kessinger/Extender:Robson, Otelia Sergeant, Cherre Blanc in Treatment: 0 Vital Signs Time Taken: 10:32 Temperature (F): 97.9 Height (in): 68 Pulse (bpm): 74 Source: Stated Respiratory Rate (breaths/min): 18 Weight (lbs): 253 Blood Pressure (mmHg): 172/90 Source: Stated Reference Range: 80 - 120 mg / dl Body Mass Index (BMI): 38.5 Notes pt did not check BS this am Electronic Signature(s) Signed: 12/30/2019 4:52:50 PM By: Baruch Gouty RN, BSN Entered By: Baruch Gouty on 12/29/2019 10:34:02  Signed: 12/29/2019 5:57:14 PM By: Levan Hurst RN, BSN Entered By: Levan Hurst on 12/29/2019 17:19:57 -------------------------------------------------------------------------------- Wound Assessment Details Patient Name: Date of Service: Nathan Miller, Nathan Miller 12/29/2019 10:30 AM Medical Record [id number] Patient Account Number: [id number] Date of Birth/Sex: Treating RN: [birth year] (69 y.o. Nathan Miller Primary Care Judah Chevere: PATIENT, NO Other Clinician: Referring Pinkie Manger: Treating Anothony Bursch/Extender:Robson, Otelia Sergeant, Cherre Blanc in Treatment: 0 Wound Status Wound Number: 3 Primary Diabetic Wound/Ulcer of the Lower Extremity Etiology: Etiology: Wound Location: Right Calcaneus - Plantar Wound Open Wounding Event: Blister Status: Date Acquired: 09/30/2019 Comorbid Coronary Artery Disease, Hypertension, Type Weeks Of Treatment: 0 History: II Diabetes, Osteoarthritis, Osteomyelitis, Clustered Wound: No Neuropathy, Confinement Anxiety Photos Wound Measurements Length: (cm) 1.6 % Reduction in Ar Width: (cm) 1.3 % Reduction in Vo Depth: (cm) 0.6 Epithelialization Area: (cm) 1.634 Undermining: Volume: (cm) 0.98 Starting Posi Ending Positio Maximum Distan ea: 0% lume: 0% : None Yes tion (o'clock): 4 n (o'clock): 2 ce: (cm) 0.7 Wound Description Classification: Grade 2  Foul Odor After C Wound Margin: Thickened Slough/Fibrino Exudate Amount: Medium Exudate Type: Serosanguineous Exudate Color: red, brown Wound Bed Granulation Amount: Large (67-100%) Granulation Quality: Red Fascia Exposed: Necrotic Amount: None Present (0%) Fat Layer (Subcut Tendon Exposed: Muscle Exposed: Joint Exposed: Bone Exposed: leansing: No No Exposed Structure No aneous Tissue) Exposed: Yes No No No No Treatment Notes Wound #3 (Right, Plantar Calcaneus) 1. Cleanse With Wound Cleanser 2. Periwound Care Skin Prep 3. Primary Dressing Applied Calcium Alginate Ag 4. Secondary Dressing Dry Gauze Roll Gauze Foam 5. Secured With Tape Notes foam donut Electronic Signature(s) Signed: 12/31/2019 4:03:04 PM By: [name] EMT/HBOT Signed: 12/31/2019 5:54:02 PM By: Levan Hurst RN, BSN Previous Signature: 12/29/2019 5:57:14 PM Version By: Levan Hurst RN, BSN Entered By: [name] on 12/31/2019 14:00:05 -------------------------------------------------------------------------------- Vitals Details Patient Name: Date of Service: Nathan Miller, Nathan Miller 12/29/2019 10:30 AM Medical Record [id number] Patient Account Number: [id number] Date of Birth/Sex: Treating RN: 1951-08-11 (69 y.o. Nathan Miller Primary Care Mrk Buzby: PATIENT, NO Other Clinician: Referring Britney Newstrom: Treating Dakarri Kessinger/Extender:Robson, Otelia Sergeant, Cherre Blanc in Treatment: 0 Vital Signs Time Taken: 10:32 Temperature (F): 97.9 Height (in): 68 Pulse (bpm): 74 Source: Stated Respiratory Rate (breaths/min): 18 Weight (lbs): 253 Blood Pressure (mmHg): 172/90 Source: Stated Reference Range: 80 - 120 mg / dl Body Mass Index (BMI): 38.5 Notes pt did not check BS this am Electronic Signature(s) Signed: 12/30/2019 4:52:50 PM By: Baruch Gouty RN, BSN Entered By: Baruch Gouty on 12/29/2019 10:34:02

## 2020-01-12 ENCOUNTER — Encounter (HOSPITAL_BASED_OUTPATIENT_CLINIC_OR_DEPARTMENT_OTHER): Payer: PPO | Admitting: Internal Medicine

## 2020-01-15 ENCOUNTER — Encounter (HOSPITAL_BASED_OUTPATIENT_CLINIC_OR_DEPARTMENT_OTHER): Payer: PPO | Admitting: Internal Medicine

## 2020-01-19 ENCOUNTER — Other Ambulatory Visit: Payer: Self-pay

## 2020-01-19 ENCOUNTER — Encounter (HOSPITAL_BASED_OUTPATIENT_CLINIC_OR_DEPARTMENT_OTHER): Payer: PPO | Admitting: Internal Medicine

## 2020-01-19 DIAGNOSIS — E11621 Type 2 diabetes mellitus with foot ulcer: Secondary | ICD-10-CM | POA: Diagnosis not present

## 2020-01-19 DIAGNOSIS — L97412 Non-pressure chronic ulcer of right heel and midfoot with fat layer exposed: Secondary | ICD-10-CM | POA: Diagnosis not present

## 2020-01-19 NOTE — Progress Notes (Signed)
Appointments: Return Appointment in 1 week. Dressing Change Frequency: Wound #3 Right,Plantar Calcaneus: Change Dressing every other day. Wound Cleansing: Wound #3 Right,Plantar Calcaneus: May shower and wash wound with soap and water. - on days that dressing is changed Primary Wound Dressing: Wound #3 Right,Plantar Calcaneus: Calcium Alginate with Silver Secondary Dressing: Wound #3 Right,Plantar Calcaneus: Foam - foam donut Kerlix/Rolled Gauze - secure with tape Dry Gauze Off-Loading: Turn and reposition every 2  hours Other: - float heels off of bed/chair with pillow under calves 1. I continued with the silver alginate for this week change every second day 2. Consider changing to Iodoflex next week sometimes promote tissue adhesion 3. Would have to wonder about Dermagraft however this would mean coming to the clinic every week and I am not sure the patient would agree Electronic Signature(s) Signed: 01/19/2020 5:34:39 PM By: Linton Ham MD Entered By: Linton Ham on 01/19/2020 12:41:28 -------------------------------------------------------------------------------- SuperBill Details Patient Name: Date of Service: [name] 01/19/2020 Medical Record [id number] Patient Account Number: [id number] Date of Birth/Sex: Treating RN: 04/05/1951 (69 y.o. 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, right ankle and foot Facility Procedures CPT4 Code Description: [id number] - DEB SUBQ TISSUE 20 SQ CM/< ICD-10 Diagnosis Description 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 of other part of right foot with E11.621 Type 2 diabetes mellitus with foot ulcer Modifier: fat layer e Quantity: 1 xposed Physician Procedures CPT4 Code Description: [id number] - WC PHYS SUBQ TISS 20 SQ CM ICD-10 Diagnosis Description 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 of other part of right foot with E11.621 Type 2 diabetes mellitus with foot ulcer Modifier: fat layer e Quantity: 1 xposed Electronic Signature(s) Signed: 01/19/2020 5:34:39 PM By: Linton Ham MD Entered By: Linton Ham on 01/19/2020 12:41:51  JAYRON, BOCCIO ([id number]) Visit Report for 01/19/2020 Debridement Details Patient Name: Date of Service: LAYN, PLETT 01/19/2020 10:45 AM Medical Record [id number] Patient Account Number: [id number] Date of Birth/Sex: February 18, 1951 (69 y.o. M) Treating RN: Levan Hurst Primary Care Provider: PATIENT, NO Other Clinician: Referring Provider: Treating Provider/Extender:Anakaren Campion, Otelia Sergeant, Cherre Blanc in Treatment: 3 Debridement Performed for Wound #3 Right,Plantar Calcaneus Assessment: Performed By: Physician Ricard Dillon., MD Debridement Type: Debridement Severity of Tissue Pre Fat layer exposed Debridement: Level of Consciousness (Pre- Awake and Alert procedure): Pre-procedure Verification/Time Out Taken: Yes - 11:44 Start Time: 11:44 Total Area Debrided (L x W): 1.6 (cm) x 1 (cm) = 1.6 (cm) Tissue and other material Viable, Non-Viable, Slough, Subcutaneous, Slough debrided: Level: Skin/Subcutaneous Tissue Debridement Description: Excisional Instrument: Curette Bleeding: Minimum Hemostasis Achieved: Pressure End Time: 11:45 Procedural Pain: 0 Post Procedural Pain: 0 Response to Treatment: Procedure was tolerated well Level of Consciousness Awake and Alert (Post-procedure): Post Debridement Measurements of Total Wound Length: (cm) 1.6 Width: (cm) 1 Depth: (cm) 0.7 Volume: (cm) 0.88 Character of Wound/Ulcer Post Improved Debridement: Severity of Tissue Post Debridement: Fat layer exposed Post Procedure Diagnosis Same as Pre-procedure Electronic Signature(s) Signed: 01/19/2020 5:18:21 PM By: Levan Hurst RN, BSN Signed: 01/19/2020 5:34:39 PM By: Linton Ham MD Entered By: Levan Hurst on 01/19/2020 11:45:02 -------------------------------------------------------------------------------- HPI Details Patient Name: Date of Service: CARNEY, MERCADEL 01/19/2020 10:45 AM Medical Record [id number] Patient Account Number: [id number] Date of  Birth/Sex: Treating RN: [birth year] (69 y.o. [name] Primary Care Provider: PATIENT, NO Other Clinician: Referring Provider: Treating Provider/Extender:Charistopher Rumble, Otelia Sergeant, Cherre Blanc in Treatment: 3 History of Present Illness Location: right foot Severity: Wag 3 Duration: #months Modifying Factors: Diabetic and charcot' foot.. Peripheral vascular disease Associated Signs and Symptoms: osteo HPI Description: see chief complaint. 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 This is been present for 5-6 years. He underwent a course of IV any biotics and hyperbaric oxygen roughly 5 years ago. He was offered an amputation by orthopedics and I believe at the time I concurred with this however the patient wishes to up go via wait-and-see approach. He is actually done fairly well over the 5 years is still ambulatory. He uses a silver alginate-based dressing on the foot which he changes himself up. 05/27/15; patient returns for his monthly visit the. The status of his foot and his wound are essentially unchanged this patient has a chronic idiopathic neuropathy. He has underlying osteomyelitis. He had extensive orthopedic surgery roughly 3 or 4 years ago had. He has underlying chronic osteomyelitis.[name] He dresses this with silver alginate. We order the supplies. I continue to see him on a monthly basis for wound debridement. 06/24/15 the patient arrives here today for his monthly wound care check. He has underlying chronic osteomyelitis. The wound was surgically debridement of nonviable circumferential tissue. He is continued with silver alginate based stressing severe this area changed daily by his wife. Post debridement this actually looks as good as I've seen this since quite some time 07/29/15. The patient has been coming here monthly for many years. He has a chronic wound on his right  lateral foot at roughly the mid shaft of his fifth metatarsal. Initially he underwent a code course of IV antibiotics hyperbaric oxygen, resection I believe of his fifth and fourth metatarsal for chronic osteomyelitis. 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 He was offered an amputation at  Appointments: Return Appointment in 1 week. Dressing Change Frequency: Wound #3 Right,Plantar Calcaneus: Change Dressing every other day. Wound Cleansing: Wound #3 Right,Plantar Calcaneus: May shower and wash wound with soap and water. - on days that dressing is changed Primary Wound Dressing: Wound #3 Right,Plantar Calcaneus: Calcium Alginate with Silver Secondary Dressing: Wound #3 Right,Plantar Calcaneus: Foam - foam donut Kerlix/Rolled Gauze - secure with tape Dry Gauze Off-Loading: Turn and reposition every 2  hours Other: - float heels off of bed/chair with pillow under calves 1. I continued with the silver alginate for this week change every second day 2. Consider changing to Iodoflex next week sometimes promote tissue adhesion 3. Would have to wonder about Dermagraft however this would mean coming to the clinic every week and I am not sure the patient would agree Electronic Signature(s) Signed: 01/19/2020 5:34:39 PM By: Linton Ham MD Entered By: Linton Ham on 01/19/2020 12:41:28 -------------------------------------------------------------------------------- SuperBill Details Patient Name: Date of Service: [name] 01/19/2020 Medical Record [id number] Patient Account Number: [id number] Date of Birth/Sex: Treating RN: 04/05/1951 (69 y.o. 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, right ankle and foot Facility Procedures CPT4 Code Description: [id number] - DEB SUBQ TISSUE 20 SQ CM/< ICD-10 Diagnosis Description 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 of other part of right foot with E11.621 Type 2 diabetes mellitus with foot ulcer Modifier: fat layer e Quantity: 1 xposed Physician Procedures CPT4 Code Description: [id number] - WC PHYS SUBQ TISS 20 SQ CM ICD-10 Diagnosis Description 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 of other part of right foot with E11.621 Type 2 diabetes mellitus with foot ulcer Modifier: fat layer e Quantity: 1 xposed Electronic Signature(s) Signed: 01/19/2020 5:34:39 PM By: Linton Ham MD Entered By: Linton Ham on 01/19/2020 12:41:51  is in this clinic many years ago for osteomyelitis and a wound on his right lateral foot. 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 He has a Charcot foot deformity. He had extensive orthopedic surgery on this foot as well removing under the underlying metatarsals that at the time had osteomyelitis. We followed him for a long period palliatively for a deep wound on the right lateral foot. He had refused an amputation probably 8 or 9 years ago and underwent IV antibiotics and hyperbaric oxygen. Miraculously this wound actually healed over I believe in 2019. The patient has a wound on the right lateral plantar heel which is not in the same spot as the previous wound. This is a weightbearing surface. The wound looks fairly benign but there is significant undermining. No erythema. They state that is started as a blister they used leftover silver alginate, Neosporin and trying to offload this as best he can. He has custom-made shoes with an AFO brace on the right. Past medical history is reviewed predominantly coronary artery disease, pacemaker, stent, type 2 diabetes with a recent hemoglobin A1c of 6.1. ABI in our clinic was 1.0  on the right 3/22; areas on the right lateral heel not any better than last time. He has some undermining medially. This is undoubtedly a weightbearing surface here. This will not be easy to heal. Electronic Signature(s) Signed: 01/19/2020 5:34:39 PM By: Linton Ham MD Entered By: Linton Ham on 01/19/2020 12:39:15 -------------------------------------------------------------------------------- Physical Exam Details Patient Name: Date of Service: NGHIA, MASSER 01/19/2020 10:45 AM Medical Record [id number] Patient Account Number: [id number] Date of Birth/Sex: Treating RN: 04-28-1951 (69 y.o. [name] Primary Care Provider: PATIENT, NO Other Clinician: Referring Provider: Treating Provider/Extender:Zarahi Fuerst, Otelia Sergeant, Cherre Blanc in Treatment: 3 Constitutional Patient is hypertensive.. Pulse regular and within target range for patient.[name] Respirations regular, non-labored and within target range.. Temperature is normal and within the target range for the patient.[name] Appears in no distress. Notes Wound exam; the area in question on the right lateral plantar heel. The undermining especially medially in this is fairly substantial. There is no erythema around the wound I used a #3 curette to remove eschar and subcutaneous tissue from around the wound margins Electronic Signature(s) Signed: 01/19/2020 5:34:39 PM By: Linton Ham MD Entered By: Linton Ham on 01/19/2020 12:40:43 -------------------------------------------------------------------------------- Physician Orders Details Patient Name: Date of Service: CHRIS, GARCIA 01/19/2020 10:45 AM Medical Record [id number] Patient Account Number: [id number] Date of Birth/Sex: Treating RN: May 26, 1951 (69 y.o. 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 Dressing Change Frequency Wound #3 Right,Plantar Calcaneus Change Dressing every other day. Wound Cleansing Wound #3 Right,Plantar Calcaneus May shower and wash wound with soap and water. - on days that dressing is changed Primary Wound Dressing Wound #3 Right,Plantar Calcaneus Calcium Alginate with Silver Secondary Dressing Wound #3 Right,Plantar Calcaneus Foam - foam donut Kerlix/Rolled Gauze - secure with tape Dry Gauze Off-Loading Turn and reposition every 2 hours Other: - float heels off of bed/chair with pillow  is in this clinic many years ago for osteomyelitis and a wound on his right lateral foot. 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 He has a Charcot foot deformity. He had extensive orthopedic surgery on this foot as well removing under the underlying metatarsals that at the time had osteomyelitis. We followed him for a long period palliatively for a deep wound on the right lateral foot. He had refused an amputation probably 8 or 9 years ago and underwent IV antibiotics and hyperbaric oxygen. Miraculously this wound actually healed over I believe in 2019. The patient has a wound on the right lateral plantar heel which is not in the same spot as the previous wound. This is a weightbearing surface. The wound looks fairly benign but there is significant undermining. No erythema. They state that is started as a blister they used leftover silver alginate, Neosporin and trying to offload this as best he can. He has custom-made shoes with an AFO brace on the right. Past medical history is reviewed predominantly coronary artery disease, pacemaker, stent, type 2 diabetes with a recent hemoglobin A1c of 6.1. ABI in our clinic was 1.0  on the right 3/22; areas on the right lateral heel not any better than last time. He has some undermining medially. This is undoubtedly a weightbearing surface here. This will not be easy to heal. Electronic Signature(s) Signed: 01/19/2020 5:34:39 PM By: Linton Ham MD Entered By: Linton Ham on 01/19/2020 12:39:15 -------------------------------------------------------------------------------- Physical Exam Details Patient Name: Date of Service: NGHIA, MASSER 01/19/2020 10:45 AM Medical Record [id number] Patient Account Number: [id number] Date of Birth/Sex: Treating RN: 04-28-1951 (69 y.o. [name] Primary Care Provider: PATIENT, NO Other Clinician: Referring Provider: Treating Provider/Extender:Zarahi Fuerst, Otelia Sergeant, Cherre Blanc in Treatment: 3 Constitutional Patient is hypertensive.. Pulse regular and within target range for patient.[name] Respirations regular, non-labored and within target range.. Temperature is normal and within the target range for the patient.[name] Appears in no distress. Notes Wound exam; the area in question on the right lateral plantar heel. The undermining especially medially in this is fairly substantial. There is no erythema around the wound I used a #3 curette to remove eschar and subcutaneous tissue from around the wound margins Electronic Signature(s) Signed: 01/19/2020 5:34:39 PM By: Linton Ham MD Entered By: Linton Ham on 01/19/2020 12:40:43 -------------------------------------------------------------------------------- Physician Orders Details Patient Name: Date of Service: CHRIS, GARCIA 01/19/2020 10:45 AM Medical Record [id number] Patient Account Number: [id number] Date of Birth/Sex: Treating RN: May 26, 1951 (69 y.o. 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 Dressing Change Frequency Wound #3 Right,Plantar Calcaneus Change Dressing every other day. Wound Cleansing Wound #3 Right,Plantar Calcaneus May shower and wash wound with soap and water. - on days that dressing is changed Primary Wound Dressing Wound #3 Right,Plantar Calcaneus Calcium Alginate with Silver Secondary Dressing Wound #3 Right,Plantar Calcaneus Foam - foam donut Kerlix/Rolled Gauze - secure with tape Dry Gauze Off-Loading Turn and reposition every 2 hours Other: - float heels off of bed/chair with pillow  JAYRON, BOCCIO ([id number]) Visit Report for 01/19/2020 Debridement Details Patient Name: Date of Service: LAYN, PLETT 01/19/2020 10:45 AM Medical Record [id number] Patient Account Number: [id number] Date of Birth/Sex: February 18, 1951 (69 y.o. M) Treating RN: Levan Hurst Primary Care Provider: PATIENT, NO Other Clinician: Referring Provider: Treating Provider/Extender:Anakaren Campion, Otelia Sergeant, Cherre Blanc in Treatment: 3 Debridement Performed for Wound #3 Right,Plantar Calcaneus Assessment: Performed By: Physician Ricard Dillon., MD Debridement Type: Debridement Severity of Tissue Pre Fat layer exposed Debridement: Level of Consciousness (Pre- Awake and Alert procedure): Pre-procedure Verification/Time Out Taken: Yes - 11:44 Start Time: 11:44 Total Area Debrided (L x W): 1.6 (cm) x 1 (cm) = 1.6 (cm) Tissue and other material Viable, Non-Viable, Slough, Subcutaneous, Slough debrided: Level: Skin/Subcutaneous Tissue Debridement Description: Excisional Instrument: Curette Bleeding: Minimum Hemostasis Achieved: Pressure End Time: 11:45 Procedural Pain: 0 Post Procedural Pain: 0 Response to Treatment: Procedure was tolerated well Level of Consciousness Awake and Alert (Post-procedure): Post Debridement Measurements of Total Wound Length: (cm) 1.6 Width: (cm) 1 Depth: (cm) 0.7 Volume: (cm) 0.88 Character of Wound/Ulcer Post Improved Debridement: Severity of Tissue Post Debridement: Fat layer exposed Post Procedure Diagnosis Same as Pre-procedure Electronic Signature(s) Signed: 01/19/2020 5:18:21 PM By: Levan Hurst RN, BSN Signed: 01/19/2020 5:34:39 PM By: Linton Ham MD Entered By: Levan Hurst on 01/19/2020 11:45:02 -------------------------------------------------------------------------------- HPI Details Patient Name: Date of Service: CARNEY, MERCADEL 01/19/2020 10:45 AM Medical Record [id number] Patient Account Number: [id number] Date of  Birth/Sex: Treating RN: [birth year] (69 y.o. [name] Primary Care Provider: PATIENT, NO Other Clinician: Referring Provider: Treating Provider/Extender:Charistopher Rumble, Otelia Sergeant, Cherre Blanc in Treatment: 3 History of Present Illness Location: right foot Severity: Wag 3 Duration: #months Modifying Factors: Diabetic and charcot' foot.. Peripheral vascular disease Associated Signs and Symptoms: osteo HPI Description: see chief complaint. 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 This is been present for 5-6 years. He underwent a course of IV any biotics and hyperbaric oxygen roughly 5 years ago. He was offered an amputation by orthopedics and I believe at the time I concurred with this however the patient wishes to up go via wait-and-see approach. He is actually done fairly well over the 5 years is still ambulatory. He uses a silver alginate-based dressing on the foot which he changes himself up. 05/27/15; patient returns for his monthly visit the. The status of his foot and his wound are essentially unchanged this patient has a chronic idiopathic neuropathy. He has underlying osteomyelitis. He had extensive orthopedic surgery roughly 3 or 4 years ago had. He has underlying chronic osteomyelitis.[name] He dresses this with silver alginate. We order the supplies. I continue to see him on a monthly basis for wound debridement. 06/24/15 the patient arrives here today for his monthly wound care check. He has underlying chronic osteomyelitis. The wound was surgically debridement of nonviable circumferential tissue. He is continued with silver alginate based stressing severe this area changed daily by his wife. Post debridement this actually looks as good as I've seen this since quite some time 07/29/15. The patient has been coming here monthly for many years. He has a chronic wound on his right  lateral foot at roughly the mid shaft of his fifth metatarsal. Initially he underwent a code course of IV antibiotics hyperbaric oxygen, resection I believe of his fifth and fourth metatarsal for chronic osteomyelitis. 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 He was offered an amputation at  is in this clinic many years ago for osteomyelitis and a wound on his right lateral foot. 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 He has a Charcot foot deformity. He had extensive orthopedic surgery on this foot as well removing under the underlying metatarsals that at the time had osteomyelitis. We followed him for a long period palliatively for a deep wound on the right lateral foot. He had refused an amputation probably 8 or 9 years ago and underwent IV antibiotics and hyperbaric oxygen. Miraculously this wound actually healed over I believe in 2019. The patient has a wound on the right lateral plantar heel which is not in the same spot as the previous wound. This is a weightbearing surface. The wound looks fairly benign but there is significant undermining. No erythema. They state that is started as a blister they used leftover silver alginate, Neosporin and trying to offload this as best he can. He has custom-made shoes with an AFO brace on the right. Past medical history is reviewed predominantly coronary artery disease, pacemaker, stent, type 2 diabetes with a recent hemoglobin A1c of 6.1. ABI in our clinic was 1.0  on the right 3/22; areas on the right lateral heel not any better than last time. He has some undermining medially. This is undoubtedly a weightbearing surface here. This will not be easy to heal. Electronic Signature(s) Signed: 01/19/2020 5:34:39 PM By: Linton Ham MD Entered By: Linton Ham on 01/19/2020 12:39:15 -------------------------------------------------------------------------------- Physical Exam Details Patient Name: Date of Service: NGHIA, MASSER 01/19/2020 10:45 AM Medical Record [id number] Patient Account Number: [id number] Date of Birth/Sex: Treating RN: 04-28-1951 (69 y.o. [name] Primary Care Provider: PATIENT, NO Other Clinician: Referring Provider: Treating Provider/Extender:Zarahi Fuerst, Otelia Sergeant, Cherre Blanc in Treatment: 3 Constitutional Patient is hypertensive.. Pulse regular and within target range for patient.[name] Respirations regular, non-labored and within target range.. Temperature is normal and within the target range for the patient.[name] Appears in no distress. Notes Wound exam; the area in question on the right lateral plantar heel. The undermining especially medially in this is fairly substantial. There is no erythema around the wound I used a #3 curette to remove eschar and subcutaneous tissue from around the wound margins Electronic Signature(s) Signed: 01/19/2020 5:34:39 PM By: Linton Ham MD Entered By: Linton Ham on 01/19/2020 12:40:43 -------------------------------------------------------------------------------- Physician Orders Details Patient Name: Date of Service: CHRIS, GARCIA 01/19/2020 10:45 AM Medical Record [id number] Patient Account Number: [id number] Date of Birth/Sex: Treating RN: May 26, 1951 (69 y.o. 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 Dressing Change Frequency Wound #3 Right,Plantar Calcaneus Change Dressing every other day. Wound Cleansing Wound #3 Right,Plantar Calcaneus May shower and wash wound with soap and water. - on days that dressing is changed Primary Wound Dressing Wound #3 Right,Plantar Calcaneus Calcium Alginate with Silver Secondary Dressing Wound #3 Right,Plantar Calcaneus Foam - foam donut Kerlix/Rolled Gauze - secure with tape Dry Gauze Off-Loading Turn and reposition every 2 hours Other: - float heels off of bed/chair with pillow  JAYRON, BOCCIO ([id number]) Visit Report for 01/19/2020 Debridement Details Patient Name: Date of Service: LAYN, PLETT 01/19/2020 10:45 AM Medical Record [id number] Patient Account Number: [id number] Date of Birth/Sex: February 18, 1951 (69 y.o. M) Treating RN: Levan Hurst Primary Care Provider: PATIENT, NO Other Clinician: Referring Provider: Treating Provider/Extender:Anakaren Campion, Otelia Sergeant, Cherre Blanc in Treatment: 3 Debridement Performed for Wound #3 Right,Plantar Calcaneus Assessment: Performed By: Physician Ricard Dillon., MD Debridement Type: Debridement Severity of Tissue Pre Fat layer exposed Debridement: Level of Consciousness (Pre- Awake and Alert procedure): Pre-procedure Verification/Time Out Taken: Yes - 11:44 Start Time: 11:44 Total Area Debrided (L x W): 1.6 (cm) x 1 (cm) = 1.6 (cm) Tissue and other material Viable, Non-Viable, Slough, Subcutaneous, Slough debrided: Level: Skin/Subcutaneous Tissue Debridement Description: Excisional Instrument: Curette Bleeding: Minimum Hemostasis Achieved: Pressure End Time: 11:45 Procedural Pain: 0 Post Procedural Pain: 0 Response to Treatment: Procedure was tolerated well Level of Consciousness Awake and Alert (Post-procedure): Post Debridement Measurements of Total Wound Length: (cm) 1.6 Width: (cm) 1 Depth: (cm) 0.7 Volume: (cm) 0.88 Character of Wound/Ulcer Post Improved Debridement: Severity of Tissue Post Debridement: Fat layer exposed Post Procedure Diagnosis Same as Pre-procedure Electronic Signature(s) Signed: 01/19/2020 5:18:21 PM By: Levan Hurst RN, BSN Signed: 01/19/2020 5:34:39 PM By: Linton Ham MD Entered By: Levan Hurst on 01/19/2020 11:45:02 -------------------------------------------------------------------------------- HPI Details Patient Name: Date of Service: CARNEY, MERCADEL 01/19/2020 10:45 AM Medical Record [id number] Patient Account Number: [id number] Date of  Birth/Sex: Treating RN: [birth year] (69 y.o. [name] Primary Care Provider: PATIENT, NO Other Clinician: Referring Provider: Treating Provider/Extender:Charistopher Rumble, Otelia Sergeant, Cherre Blanc in Treatment: 3 History of Present Illness Location: right foot Severity: Wag 3 Duration: #months Modifying Factors: Diabetic and charcot' foot.. Peripheral vascular disease Associated Signs and Symptoms: osteo HPI Description: see chief complaint. 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 This is been present for 5-6 years. He underwent a course of IV any biotics and hyperbaric oxygen roughly 5 years ago. He was offered an amputation by orthopedics and I believe at the time I concurred with this however the patient wishes to up go via wait-and-see approach. He is actually done fairly well over the 5 years is still ambulatory. He uses a silver alginate-based dressing on the foot which he changes himself up. 05/27/15; patient returns for his monthly visit the. The status of his foot and his wound are essentially unchanged this patient has a chronic idiopathic neuropathy. He has underlying osteomyelitis. He had extensive orthopedic surgery roughly 3 or 4 years ago had. He has underlying chronic osteomyelitis.[name] He dresses this with silver alginate. We order the supplies. I continue to see him on a monthly basis for wound debridement. 06/24/15 the patient arrives here today for his monthly wound care check. He has underlying chronic osteomyelitis. The wound was surgically debridement of nonviable circumferential tissue. He is continued with silver alginate based stressing severe this area changed daily by his wife. Post debridement this actually looks as good as I've seen this since quite some time 07/29/15. The patient has been coming here monthly for many years. He has a chronic wound on his right  lateral foot at roughly the mid shaft of his fifth metatarsal. Initially he underwent a code course of IV antibiotics hyperbaric oxygen, resection I believe of his fifth and fourth metatarsal for chronic osteomyelitis. 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 He was offered an amputation at  is in this clinic many years ago for osteomyelitis and a wound on his right lateral foot. 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 He has a Charcot foot deformity. He had extensive orthopedic surgery on this foot as well removing under the underlying metatarsals that at the time had osteomyelitis. We followed him for a long period palliatively for a deep wound on the right lateral foot. He had refused an amputation probably 8 or 9 years ago and underwent IV antibiotics and hyperbaric oxygen. Miraculously this wound actually healed over I believe in 2019. The patient has a wound on the right lateral plantar heel which is not in the same spot as the previous wound. This is a weightbearing surface. The wound looks fairly benign but there is significant undermining. No erythema. They state that is started as a blister they used leftover silver alginate, Neosporin and trying to offload this as best he can. He has custom-made shoes with an AFO brace on the right. Past medical history is reviewed predominantly coronary artery disease, pacemaker, stent, type 2 diabetes with a recent hemoglobin A1c of 6.1. ABI in our clinic was 1.0  on the right 3/22; areas on the right lateral heel not any better than last time. He has some undermining medially. This is undoubtedly a weightbearing surface here. This will not be easy to heal. Electronic Signature(s) Signed: 01/19/2020 5:34:39 PM By: Linton Ham MD Entered By: Linton Ham on 01/19/2020 12:39:15 -------------------------------------------------------------------------------- Physical Exam Details Patient Name: Date of Service: NGHIA, MASSER 01/19/2020 10:45 AM Medical Record [id number] Patient Account Number: [id number] Date of Birth/Sex: Treating RN: 04-28-1951 (69 y.o. [name] Primary Care Provider: PATIENT, NO Other Clinician: Referring Provider: Treating Provider/Extender:Zarahi Fuerst, Otelia Sergeant, Cherre Blanc in Treatment: 3 Constitutional Patient is hypertensive.. Pulse regular and within target range for patient.[name] Respirations regular, non-labored and within target range.. Temperature is normal and within the target range for the patient.[name] Appears in no distress. Notes Wound exam; the area in question on the right lateral plantar heel. The undermining especially medially in this is fairly substantial. There is no erythema around the wound I used a #3 curette to remove eschar and subcutaneous tissue from around the wound margins Electronic Signature(s) Signed: 01/19/2020 5:34:39 PM By: Linton Ham MD Entered By: Linton Ham on 01/19/2020 12:40:43 -------------------------------------------------------------------------------- Physician Orders Details Patient Name: Date of Service: CHRIS, GARCIA 01/19/2020 10:45 AM Medical Record [id number] Patient Account Number: [id number] Date of Birth/Sex: Treating RN: May 26, 1951 (69 y.o. 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 Dressing Change Frequency Wound #3 Right,Plantar Calcaneus Change Dressing every other day. Wound Cleansing Wound #3 Right,Plantar Calcaneus May shower and wash wound with soap and water. - on days that dressing is changed Primary Wound Dressing Wound #3 Right,Plantar Calcaneus Calcium Alginate with Silver Secondary Dressing Wound #3 Right,Plantar Calcaneus Foam - foam donut Kerlix/Rolled Gauze - secure with tape Dry Gauze Off-Loading Turn and reposition every 2 hours Other: - float heels off of bed/chair with pillow  under calves Electronic Signature(s) Signed: 01/19/2020 5:18:21 PM By: Levan Hurst RN, BSN Signed: 01/19/2020 5:34:39 PM By: Linton Ham MD Entered By: Levan Hurst on 01/19/2020 11:47:53 -------------------------------------------------------------------------------- Problem List Details Patient Name: Date of Service: WYNN, POCIASK 01/19/2020 10:45 AM Medical Record [id number] Patient Account Number: [id number] Date of Birth/Sex: Treating RN: [birth year] (69 y.o. [name] Primary Care Provider: PATIENT, NO Other Clinician: Referring Provider: Treating Provider/Extender:Kabella Cassidy, Otelia Sergeant, Cherre Blanc in Treatment: 3 Active Problems ICD-10 Evaluated Encounter Code Description Active Date Today Diagnosis E11.621 Type 2 diabetes mellitus with foot ulcer 12/29/2019 No Yes 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 of other part of right foot 12/29/2019 No Yes with fat layer exposed E11.42 Type 2 diabetes mellitus with diabetic polyneuropathy 12/29/2019 No Yes M14.671 Charcot's joint, right ankle and foot  12/29/2019 No Yes Inactive Problems Resolved Problems Electronic Signature(s) Signed: 01/19/2020 5:34:39 PM By: Linton Ham MD Entered By: Linton Ham on 01/19/2020 12:37:15 -------------------------------------------------------------------------------- Progress Note Details Patient Name: Date of Service: [name] 01/19/2020 10:45 AM Medical Record [id number] Patient Account Number: [id number] Date of Birth/Sex: Treating RN: [birth year] (69 y.o. [name] Primary Care Provider: PATIENT, NO Other Clinician: Referring Provider: Treating Provider/Extender:Willem Klingensmith, Otelia Sergeant, Cherre Blanc in Treatment: 3 Subjective History of Present Illness (HPI) The following HPI elements were documented for the patient's wound: Location: right foot Severity: Wag 3 Duration: #months Modifying Factors: Diabetic and charcot' foot.. Peripheral vascular disease Associated Signs and Symptoms: osteo see chief complaint. 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 This is been present for 5-6 years. He underwent a course of IV any biotics and hyperbaric oxygen roughly 5 years ago. He was offered an amputation by orthopedics and I believe at the time I concurred with this however the patient wishes to up go via wait-and-see approach. He is actually done fairly well over the 5 years is still ambulatory. He uses a silver alginate-based dressing on the foot which he changes himself up. 05/27/15; patient returns for his monthly visit the. The status of his foot and his wound are essentially unchanged this patient has a chronic idiopathic neuropathy. He has underlying osteomyelitis. He had extensive orthopedic surgery roughly 3 or 4 years ago had. He has underlying chronic osteomyelitis.[name] He dresses this with silver alginate. We order the supplies. I continue to see him on a monthly  basis for wound debridement. 06/24/15 the patient arrives here today for his monthly wound care check. He has underlying chronic osteomyelitis. The wound was surgically debridement of nonviable circumferential tissue. He is continued with silver alginate based stressing severe this area changed daily by his wife. Post debridement this actually looks as good as I've seen this since quite some time 07/29/15. The patient has been coming here monthly for many years. He has a chronic wound on his right lateral foot at roughly the mid shaft of his fifth metatarsal. Initially he underwent a code course of IV antibiotics hyperbaric oxygen, resection I believe of his fifth and fourth metatarsal for chronic osteomyelitis. 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 He was offered an amputation at some point but refused. He comes here for "palliative" wound care which requires extensive debridement of necrotic subcutaneous tissue to clean up the wound bed. He has been using silver alginate based dressings at home for many years. Although he has extensive edema of his foot, the situation has overall remained stable perhaps surprisingly so.  is in this clinic many years ago for osteomyelitis and a wound on his right lateral foot. 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 He has a Charcot foot deformity. He had extensive orthopedic surgery on this foot as well removing under the underlying metatarsals that at the time had osteomyelitis. We followed him for a long period palliatively for a deep wound on the right lateral foot. He had refused an amputation probably 8 or 9 years ago and underwent IV antibiotics and hyperbaric oxygen. Miraculously this wound actually healed over I believe in 2019. The patient has a wound on the right lateral plantar heel which is not in the same spot as the previous wound. This is a weightbearing surface. The wound looks fairly benign but there is significant undermining. No erythema. They state that is started as a blister they used leftover silver alginate, Neosporin and trying to offload this as best he can. He has custom-made shoes with an AFO brace on the right. Past medical history is reviewed predominantly coronary artery disease, pacemaker, stent, type 2 diabetes with a recent hemoglobin A1c of 6.1. ABI in our clinic was 1.0  on the right 3/22; areas on the right lateral heel not any better than last time. He has some undermining medially. This is undoubtedly a weightbearing surface here. This will not be easy to heal. Electronic Signature(s) Signed: 01/19/2020 5:34:39 PM By: Linton Ham MD Entered By: Linton Ham on 01/19/2020 12:39:15 -------------------------------------------------------------------------------- Physical Exam Details Patient Name: Date of Service: NGHIA, MASSER 01/19/2020 10:45 AM Medical Record [id number] Patient Account Number: [id number] Date of Birth/Sex: Treating RN: 04-28-1951 (69 y.o. [name] Primary Care Provider: PATIENT, NO Other Clinician: Referring Provider: Treating Provider/Extender:Zarahi Fuerst, Otelia Sergeant, Cherre Blanc in Treatment: 3 Constitutional Patient is hypertensive.. Pulse regular and within target range for patient.[name] Respirations regular, non-labored and within target range.. Temperature is normal and within the target range for the patient.[name] Appears in no distress. Notes Wound exam; the area in question on the right lateral plantar heel. The undermining especially medially in this is fairly substantial. There is no erythema around the wound I used a #3 curette to remove eschar and subcutaneous tissue from around the wound margins Electronic Signature(s) Signed: 01/19/2020 5:34:39 PM By: Linton Ham MD Entered By: Linton Ham on 01/19/2020 12:40:43 -------------------------------------------------------------------------------- Physician Orders Details Patient Name: Date of Service: CHRIS, GARCIA 01/19/2020 10:45 AM Medical Record [id number] Patient Account Number: [id number] Date of Birth/Sex: Treating RN: May 26, 1951 (69 y.o. 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 Dressing Change Frequency Wound #3 Right,Plantar Calcaneus Change Dressing every other day. Wound Cleansing Wound #3 Right,Plantar Calcaneus May shower and wash wound with soap and water. - on days that dressing is changed Primary Wound Dressing Wound #3 Right,Plantar Calcaneus Calcium Alginate with Silver Secondary Dressing Wound #3 Right,Plantar Calcaneus Foam - foam donut Kerlix/Rolled Gauze - secure with tape Dry Gauze Off-Loading Turn and reposition every 2 hours Other: - float heels off of bed/chair with pillow  is in this clinic many years ago for osteomyelitis and a wound on his right lateral foot. 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 He has a Charcot foot deformity. He had extensive orthopedic surgery on this foot as well removing under the underlying metatarsals that at the time had osteomyelitis. We followed him for a long period palliatively for a deep wound on the right lateral foot. He had refused an amputation probably 8 or 9 years ago and underwent IV antibiotics and hyperbaric oxygen. Miraculously this wound actually healed over I believe in 2019. The patient has a wound on the right lateral plantar heel which is not in the same spot as the previous wound. This is a weightbearing surface. The wound looks fairly benign but there is significant undermining. No erythema. They state that is started as a blister they used leftover silver alginate, Neosporin and trying to offload this as best he can. He has custom-made shoes with an AFO brace on the right. Past medical history is reviewed predominantly coronary artery disease, pacemaker, stent, type 2 diabetes with a recent hemoglobin A1c of 6.1. ABI in our clinic was 1.0  on the right 3/22; areas on the right lateral heel not any better than last time. He has some undermining medially. This is undoubtedly a weightbearing surface here. This will not be easy to heal. Electronic Signature(s) Signed: 01/19/2020 5:34:39 PM By: Linton Ham MD Entered By: Linton Ham on 01/19/2020 12:39:15 -------------------------------------------------------------------------------- Physical Exam Details Patient Name: Date of Service: NGHIA, MASSER 01/19/2020 10:45 AM Medical Record [id number] Patient Account Number: [id number] Date of Birth/Sex: Treating RN: 04-28-1951 (69 y.o. [name] Primary Care Provider: PATIENT, NO Other Clinician: Referring Provider: Treating Provider/Extender:Zarahi Fuerst, Otelia Sergeant, Cherre Blanc in Treatment: 3 Constitutional Patient is hypertensive.. Pulse regular and within target range for patient.[name] Respirations regular, non-labored and within target range.. Temperature is normal and within the target range for the patient.[name] Appears in no distress. Notes Wound exam; the area in question on the right lateral plantar heel. The undermining especially medially in this is fairly substantial. There is no erythema around the wound I used a #3 curette to remove eschar and subcutaneous tissue from around the wound margins Electronic Signature(s) Signed: 01/19/2020 5:34:39 PM By: Linton Ham MD Entered By: Linton Ham on 01/19/2020 12:40:43 -------------------------------------------------------------------------------- Physician Orders Details Patient Name: Date of Service: CHRIS, GARCIA 01/19/2020 10:45 AM Medical Record [id number] Patient Account Number: [id number] Date of Birth/Sex: Treating RN: May 26, 1951 (69 y.o. 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 Dressing Change Frequency Wound #3 Right,Plantar Calcaneus Change Dressing every other day. Wound Cleansing Wound #3 Right,Plantar Calcaneus May shower and wash wound with soap and water. - on days that dressing is changed Primary Wound Dressing Wound #3 Right,Plantar Calcaneus Calcium Alginate with Silver Secondary Dressing Wound #3 Right,Plantar Calcaneus Foam - foam donut Kerlix/Rolled Gauze - secure with tape Dry Gauze Off-Loading Turn and reposition every 2 hours Other: - float heels off of bed/chair with pillow  under calves Electronic Signature(s) Signed: 01/19/2020 5:18:21 PM By: Levan Hurst RN, BSN Signed: 01/19/2020 5:34:39 PM By: Linton Ham MD Entered By: Levan Hurst on 01/19/2020 11:47:53 -------------------------------------------------------------------------------- Problem List Details Patient Name: Date of Service: WYNN, POCIASK 01/19/2020 10:45 AM Medical Record [id number] Patient Account Number: [id number] Date of Birth/Sex: Treating RN: [birth year] (69 y.o. [name] Primary Care Provider: PATIENT, NO Other Clinician: Referring Provider: Treating Provider/Extender:Kabella Cassidy, Otelia Sergeant, Cherre Blanc in Treatment: 3 Active Problems ICD-10 Evaluated Encounter Code Description Active Date Today Diagnosis E11.621 Type 2 diabetes mellitus with foot ulcer 12/29/2019 No Yes 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 of other part of right foot 12/29/2019 No Yes with fat layer exposed E11.42 Type 2 diabetes mellitus with diabetic polyneuropathy 12/29/2019 No Yes M14.671 Charcot's joint, right ankle and foot  12/29/2019 No Yes Inactive Problems Resolved Problems Electronic Signature(s) Signed: 01/19/2020 5:34:39 PM By: Linton Ham MD Entered By: Linton Ham on 01/19/2020 12:37:15 -------------------------------------------------------------------------------- Progress Note Details Patient Name: Date of Service: [name] 01/19/2020 10:45 AM Medical Record [id number] Patient Account Number: [id number] Date of Birth/Sex: Treating RN: [birth year] (69 y.o. [name] Primary Care Provider: PATIENT, NO Other Clinician: Referring Provider: Treating Provider/Extender:Willem Klingensmith, Otelia Sergeant, Cherre Blanc in Treatment: 3 Subjective History of Present Illness (HPI) The following HPI elements were documented for the patient's wound: Location: right foot Severity: Wag 3 Duration: #months Modifying Factors: Diabetic and charcot' foot.. Peripheral vascular disease Associated Signs and Symptoms: osteo see chief complaint. 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 This is been present for 5-6 years. He underwent a course of IV any biotics and hyperbaric oxygen roughly 5 years ago. He was offered an amputation by orthopedics and I believe at the time I concurred with this however the patient wishes to up go via wait-and-see approach. He is actually done fairly well over the 5 years is still ambulatory. He uses a silver alginate-based dressing on the foot which he changes himself up. 05/27/15; patient returns for his monthly visit the. The status of his foot and his wound are essentially unchanged this patient has a chronic idiopathic neuropathy. He has underlying osteomyelitis. He had extensive orthopedic surgery roughly 3 or 4 years ago had. He has underlying chronic osteomyelitis.[name] He dresses this with silver alginate. We order the supplies. I continue to see him on a monthly  basis for wound debridement. 06/24/15 the patient arrives here today for his monthly wound care check. He has underlying chronic osteomyelitis. The wound was surgically debridement of nonviable circumferential tissue. He is continued with silver alginate based stressing severe this area changed daily by his wife. Post debridement this actually looks as good as I've seen this since quite some time 07/29/15. The patient has been coming here monthly for many years. He has a chronic wound on his right lateral foot at roughly the mid shaft of his fifth metatarsal. Initially he underwent a code course of IV antibiotics hyperbaric oxygen, resection I believe of his fifth and fourth metatarsal for chronic osteomyelitis. 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 He was offered an amputation at some point but refused. He comes here for "palliative" wound care which requires extensive debridement of necrotic subcutaneous tissue to clean up the wound bed. He has been using silver alginate based dressings at home for many years. Although he has extensive edema of his foot, the situation has overall remained stable perhaps surprisingly so.

## 2020-01-19 NOTE — Progress Notes (Addendum)
Color: [3:red, brown] [N/A:N/A] Wound Margin: [3:Thickened] [N/A:N/A] Granulation Amount: [3:Large (67-100%)] [N/A:N/A] Granulation Quality: [3:Red, Pink] [N/A:N/A] Necrotic Amount: [3:None Present (0%)] [N/A:N/A] Exposed Structures: [3:Fat Layer (Subcutaneous N/A Tissue) Exposed: Yes Fascia: No Tendon: No Muscle: No Joint: No Bone: No] Epithelialization: [3:None] [N/A:N/A] Debridement: [3:Debridement - Excisional N/A] Pre-procedure [3:11:44] [N/A:N/A] Verification/Time Out Taken: Tissue Debrided: [3:Subcutaneous, Slough] [N/A:N/A] Level: [3:Skin/Subcutaneous Tissue] [N/A:N/A] Debridement Area (sq cm):1.6 [N/A:N/A] Instrument: [3:Curette] [N/A:N/A] Bleeding: [3:Minimum] [N/A:N/A] Hemostasis Achieved: [3:Pressure] [N/A:N/A] Procedural Pain: [3:0] [N/A:N/A] Post Procedural Pain: [3:0] [N/A:N/A] Debridement Treatment Procedure was tolerated [N/A:N/A] Response: [3:well] Post Debridement [3:1.6x1x0.7] [N/A:N/A] Measurements L x W x D (cm) Post  Debridement [3:0.88] [N/A:N/A] Volume: (cm) Procedures Performed: Debridement [N/A:N/A] Treatment Notes Wound #3 (Right, Plantar Calcaneus) 1. Cleanse With Wound Cleanser 3. Primary Dressing Applied Calcium Alginate Ag 4. Secondary Dressing Dry Gauze Roll Gauze Foam 5. Secured With Tape Notes foam donut Electronic Signature(s) Signed: 01/19/2020 5:18:21 PM By: Levan Hurst RN, BSN Signed: 01/19/2020 5:34:39 PM By: Linton Ham MD Entered By: Linton Ham on 01/19/2020 12:37:31 -------------------------------------------------------------------------------- Pain Assessment Details Patient Name: Date of Service: Nathan Miller, Nathan Miller 01/19/2020 10:45 AM Medical Record [id number] Patient Account Number: [id number] Date of Birth/Sex: Treating RN: 05-21-1951 (69 y.o. [name] Primary Care [name]: PATIENT, NO Other Clinician: Referring [name]: Treating [name]/Extender:Robson, Otelia Sergeant, Cherre Blanc in Treatment: 3 Active Problems Location of Pain Severity and Description of Pain Patient Has Paino No Site Locations Pain Management and Medication Current Pain Management: Electronic Signature(s) Signed: 01/19/2020 5:21:34 PM By: Kela Millin Entered By: Kela Millin on 01/19/2020 11:10:43 -------------------------------------------------------------------------------- Wound Assessment Details Patient Name: Date of Service: Nathan Miller, Nathan Miller 01/19/2020 10:45 AM Medical Record [id number] Patient Account Number: [id number] Date of Birth/Sex: Treating RN: 06-Mar-1951 (68 y.o. [name] Primary Care [name]: PATIENT, NO Other Clinician: Referring [name]: Treating [name]/Extender:Robson, Otelia Sergeant, Cherre Blanc in Treatment: 3 Wound Status Wound Number: 3 Primary Diabetic Wound/Ulcer of the Lower Extremity Etiology: Wound Location: Right, Plantar Calcaneus Wound Open Wounding Event: Blister Status: Date Acquired:  09/30/2019 Comorbid Coronary Artery Disease, Hypertension, Type Weeks Of Treatment: 3 History: II Diabetes, Osteoarthritis, Osteomyelitis, Clustered Wound: No Neuropathy, Confinement Anxiety Photos Photo Uploaded By: [name] on 01/20/2020 09:52:31 Wound Measurements Length: (cm) 1.6 Width: (cm) 1 Depth: (cm) 0.7 Area: (cm) 1.257 Volume: (cm) 0.88 % Reduction in Area: 23.1% % Reduction in Volume: 10.2% Epithelialization: None Tunneling: No Undermining: Yes Starting Position (o'clock): 7 Ending Position (o'clock): 3 Maximum Distance: (cm) 0.4 Wound Description Classification: Grade 2 Wound Margin: Thickened Exudate Amount: Medium Exudate Type: Serosanguineous Exudate Color: red, brown Wound Bed Granulation Amount: Large (67-100%) Granulation Quality: Red, Pink Necrotic Amount: None Present (0%) Foul Odor After Cleansing: No Slough/Fibrino No Exposed Structure Fascia Exposed: No Fat Layer (Subcutaneous Tissue) Exposed: Yes Tendon Exposed: No Muscle Exposed: No Joint Exposed: No Bone Exposed: No Electronic Signature(s) Signed: 01/20/2020 3:59:58 PM By: [name] EMT/HBOT Signed: 02/11/2020 9:02:47 AM By: Levan Hurst RN, BSN Previous Signature: 01/19/2020 5:21:34 PM Version By: Kela Millin Entered By: [name] on 01/20/2020 10:58:12 -------------------------------------------------------------------------------- Vitals Details Patient Name: Date of Service: Nathan Miller, Nathan Miller 01/19/2020 10:45 AM Medical Record [id number] Patient Account Number: [id number] Date of Birth/Sex: Treating RN: 1950-12-16 (69 y.o. [name] Primary Care [name]: PATIENT, NO Other Clinician: Referring [name]: Treating [name]/Extender:Robson, Otelia Sergeant, Cherre Blanc in Treatment: 3 Vital Signs Time Taken: 11:10 Temperature (F): 98.2 Height (in): 68 Pulse (bpm): 75 Weight (lbs): 253 Respiratory Rate (breaths/min): 18 Body Mass Index  (BMI): 38.5 Blood  Color: [3:red, brown] [N/A:N/A] Wound Margin: [3:Thickened] [N/A:N/A] Granulation Amount: [3:Large (67-100%)] [N/A:N/A] Granulation Quality: [3:Red, Pink] [N/A:N/A] Necrotic Amount: [3:None Present (0%)] [N/A:N/A] Exposed Structures: [3:Fat Layer (Subcutaneous N/A Tissue) Exposed: Yes Fascia: No Tendon: No Muscle: No Joint: No Bone: No] Epithelialization: [3:None] [N/A:N/A] Debridement: [3:Debridement - Excisional N/A] Pre-procedure [3:11:44] [N/A:N/A] Verification/Time Out Taken: Tissue Debrided: [3:Subcutaneous, Slough] [N/A:N/A] Level: [3:Skin/Subcutaneous Tissue] [N/A:N/A] Debridement Area (sq cm):1.6 [N/A:N/A] Instrument: [3:Curette] [N/A:N/A] Bleeding: [3:Minimum] [N/A:N/A] Hemostasis Achieved: [3:Pressure] [N/A:N/A] Procedural Pain: [3:0] [N/A:N/A] Post Procedural Pain: [3:0] [N/A:N/A] Debridement Treatment Procedure was tolerated [N/A:N/A] Response: [3:well] Post Debridement [3:1.6x1x0.7] [N/A:N/A] Measurements L x W x D (cm) Post  Debridement [3:0.88] [N/A:N/A] Volume: (cm) Procedures Performed: Debridement [N/A:N/A] Treatment Notes Wound #3 (Right, Plantar Calcaneus) 1. Cleanse With Wound Cleanser 3. Primary Dressing Applied Calcium Alginate Ag 4. Secondary Dressing Dry Gauze Roll Gauze Foam 5. Secured With Tape Notes foam donut Electronic Signature(s) Signed: 01/19/2020 5:18:21 PM By: Levan Hurst RN, BSN Signed: 01/19/2020 5:34:39 PM By: Linton Ham MD Entered By: Linton Ham on 01/19/2020 12:37:31 -------------------------------------------------------------------------------- Pain Assessment Details Patient Name: Date of Service: Nathan Miller, Nathan Miller 01/19/2020 10:45 AM Medical Record [id number] Patient Account Number: [id number] Date of Birth/Sex: Treating RN: 05-21-1951 (69 y.o. [name] Primary Care [name]: PATIENT, NO Other Clinician: Referring [name]: Treating [name]/Extender:Robson, Otelia Sergeant, Cherre Blanc in Treatment: 3 Active Problems Location of Pain Severity and Description of Pain Patient Has Paino No Site Locations Pain Management and Medication Current Pain Management: Electronic Signature(s) Signed: 01/19/2020 5:21:34 PM By: Kela Millin Entered By: Kela Millin on 01/19/2020 11:10:43 -------------------------------------------------------------------------------- Wound Assessment Details Patient Name: Date of Service: Nathan Miller, Nathan Miller 01/19/2020 10:45 AM Medical Record [id number] Patient Account Number: [id number] Date of Birth/Sex: Treating RN: 06-Mar-1951 (68 y.o. [name] Primary Care [name]: PATIENT, NO Other Clinician: Referring [name]: Treating [name]/Extender:Robson, Otelia Sergeant, Cherre Blanc in Treatment: 3 Wound Status Wound Number: 3 Primary Diabetic Wound/Ulcer of the Lower Extremity Etiology: Wound Location: Right, Plantar Calcaneus Wound Open Wounding Event: Blister Status: Date Acquired:  09/30/2019 Comorbid Coronary Artery Disease, Hypertension, Type Weeks Of Treatment: 3 History: II Diabetes, Osteoarthritis, Osteomyelitis, Clustered Wound: No Neuropathy, Confinement Anxiety Photos Photo Uploaded By: [name] on 01/20/2020 09:52:31 Wound Measurements Length: (cm) 1.6 Width: (cm) 1 Depth: (cm) 0.7 Area: (cm) 1.257 Volume: (cm) 0.88 % Reduction in Area: 23.1% % Reduction in Volume: 10.2% Epithelialization: None Tunneling: No Undermining: Yes Starting Position (o'clock): 7 Ending Position (o'clock): 3 Maximum Distance: (cm) 0.4 Wound Description Classification: Grade 2 Wound Margin: Thickened Exudate Amount: Medium Exudate Type: Serosanguineous Exudate Color: red, brown Wound Bed Granulation Amount: Large (67-100%) Granulation Quality: Red, Pink Necrotic Amount: None Present (0%) Foul Odor After Cleansing: No Slough/Fibrino No Exposed Structure Fascia Exposed: No Fat Layer (Subcutaneous Tissue) Exposed: Yes Tendon Exposed: No Muscle Exposed: No Joint Exposed: No Bone Exposed: No Electronic Signature(s) Signed: 01/20/2020 3:59:58 PM By: [name] EMT/HBOT Signed: 02/11/2020 9:02:47 AM By: Levan Hurst RN, BSN Previous Signature: 01/19/2020 5:21:34 PM Version By: Kela Millin Entered By: [name] on 01/20/2020 10:58:12 -------------------------------------------------------------------------------- Vitals Details Patient Name: Date of Service: Nathan Miller, Nathan Miller 01/19/2020 10:45 AM Medical Record [id number] Patient Account Number: [id number] Date of Birth/Sex: Treating RN: 1950-12-16 (69 y.o. [name] Primary Care [name]: PATIENT, NO Other Clinician: Referring [name]: Treating [name]/Extender:Robson, Otelia Sergeant, Cherre Blanc in Treatment: 3 Vital Signs Time Taken: 11:10 Temperature (F): 98.2 Height (in): 68 Pulse (bpm): 75 Weight (lbs): 253 Respiratory Rate (breaths/min): 18 Body Mass Index  (BMI): 38.5 Blood  Nathan Miller, Nathan Miller ([id number]) Visit Report for 01/19/2020 Arrival Information Details Patient Name: Date of Service: Nathan Miller, Nathan Miller 01/19/2020 10:45 AM Medical Record [id number] Patient Account Number: [id number] Date of Birth/Sex: Treating RN: [birth year] (69 y.o. [name] Primary Care Alvia Tory: PATIENT, NO Other Clinician: Referring Marwah Disbro: Treating Brandalyn Harting/Extender:Robson, Otelia Sergeant, Cherre Blanc in Treatment: 3 Visit Information History Since Last Visit Cane Added or deleted any medications: No Patient Arrived: 11:07 Any new allergies or adverse reactions: No Arrival Time: Had a fall or experienced change in No Accompanied By: wife None activities of daily living that may affect Transfer Assistance: risk of falls: Patient Identification Verified: Yes Signs or symptoms of abuse/neglect since last No Secondary Verification Process Completed: Yes visito Patient Requires Transmission-Based No Hospitalized since last visit: No Precautions: Implantable device outside of the clinic excluding No Patient Has Alerts: No cellular tissue based products placed in the center since last visit: Has Dressing in Place as Prescribed: Yes Pain Present Now: No Electronic Signature(s) Signed: 01/19/2020 5:21:34 PM By: Kela Millin Entered By: Kela Millin on 01/19/2020 11:07:38 -------------------------------------------------------------------------------- Encounter Discharge Information Details Patient Name: Date of Service: Nathan Eastern D. 01/19/2020 10:45 AM Medical Record [id number] Patient Account Number: [id number] Date of Birth/Sex: Treating RN: May 07, 1951 (69 y.o. [name] Primary Care Adrine Hayworth: PATIENT, NO Other Clinician: Referring Faiga Stones: Treating Bashir Marchetti/Extender:Robson, Otelia Sergeant, Cherre Blanc in Treatment: 3 Encounter Discharge Information Items Post Procedure Vitals Discharge Condition: Stable Temperature (F):  98.2 Ambulatory Status: Cane Pulse (bpm): 75 Discharge Destination: Home Respiratory Rate (breaths/min): 18 Transportation: Private Auto Blood Pressure (mmHg): 166/81 Accompanied By: wife Schedule Follow-up Appointment: Yes Clinical Summary of Care: Patient Declined Electronic Signature(s) Signed: 01/19/2020 5:21:34 PM By: Kela Millin Entered By: Kela Millin on 01/19/2020 11:52:21 -------------------------------------------------------------------------------- Lower Extremity Assessment Details Patient Name: Date of Service: Nathan Miller, Nathan Miller 01/19/2020 10:45 AM Medical Record [id number] Patient Account Number: [id number] Date of Birth/Sex: Treating RN: 11/26/1950 (68 y.o. [name] Primary Care Marcele [name]: PATIENT, NO Other Clinician: Referring Sharmane Dame: Treating Tyleigh Mahn/Extender:Robson, Otelia Sergeant, Cherre Blanc in Treatment: 3 Edema Assessment Assessed: [Left: No] [Right: No] Edema: [Left: Ye] [Right: s] Calf Left: Right: Point of Measurement: cm From Medial Instep cm 41 cm Ankle Left: Right: Point of Measurement: cm From Medial Instep cm 27.9 cm Vascular Assessment Pulses: Dorsalis Pedis Palpable: [Right:Yes] Electronic Signature(s) Signed: 01/19/2020 5:21:34 PM By: Kela Millin Entered By: Kela Millin on 01/19/2020 11:15:24 -------------------------------------------------------------------------------- Multi Wound Chart Details Patient Name: Date of Service: Nathan Busman. 01/19/2020 10:45 AM Medical Record [id number] Patient Account Number: [id number] Date of Birth/Sex: Treating RN: 1950-11-25 (69 y.o. [name] Primary Care Jerae Izard: PATIENT, NO Other Clinician: Referring Aleeya Veitch: Treating Miko Markwood/Extender:Robson, Otelia Sergeant, Cherre Blanc in Treatment: 3 Vital Signs Height(in): 68 Pulse(bpm): 75 Weight(lbs): 253 Blood Pressure(mmHg): 166/81 Body Mass Index(BMI): 38 Temperature(F):  98.2 Respiratory 18 Rate(breaths/min): Photos: [3:No Photos] [N/A:N/A] Wound Location: [3:Right Calcaneus - Plantar N/A] Wounding Event: [3:Blister] [N/A:N/A] Primary Etiology: [3:Diabetic Wound/Ulcer of the N/A Lower Extremity] Comorbid History: [3:Coronary Artery Disease, N/A Hypertension, Type II Diabetes, Osteoarthritis, Osteomyelitis, Neuropathy, Confinement Anxiety] Date Acquired: [3:09/30/2019] [N/A:N/A] Weeks of Treatment: [3:3] [N/A:N/A] Wound Status: [3:Open] [N/A:N/A] Measurements L x W x D 1.6x1x0.7 [N/A:N/A] (cm) Area (cm) : [3:1.257] [N/A:N/A] Volume (cm) : [3:0.88] [N/A:N/A] % Reduction in Area: [3:23.10%] [N/A:N/A] % Reduction in Volume: 10.20% [N/A:N/A] Starting Position 1 7 (o'clock): Ending Position 1 [3:3] (o'clock): Maximum Distance 1 [3:0.4] (cm): Undermining: [3:Yes] [N/A:N/A] Classification: [3:Grade 2] [N/A:N/A] Exudate Amount: [3:Medium] [N/A:N/A] Exudate Type: [3:Serosanguineous] [N/A:N/A] Exudate

## 2020-01-26 ENCOUNTER — Encounter (HOSPITAL_BASED_OUTPATIENT_CLINIC_OR_DEPARTMENT_OTHER): Payer: PPO | Admitting: Internal Medicine

## 2020-01-26 ENCOUNTER — Other Ambulatory Visit: Payer: Self-pay

## 2020-01-26 DIAGNOSIS — E11621 Type 2 diabetes mellitus with foot ulcer: Secondary | ICD-10-CM | POA: Diagnosis not present

## 2020-01-26 DIAGNOSIS — L97412 Non-pressure chronic ulcer of right heel and midfoot with fat layer exposed: Secondary | ICD-10-CM | POA: Diagnosis not present

## 2020-01-28 DIAGNOSIS — T8189XA Other complications of procedures, not elsewhere classified, initial encounter: Secondary | ICD-10-CM | POA: Diagnosis not present

## 2020-01-30 NOTE — Progress Notes (Signed)
Nathan Miller, Nathan Miller ([id number]) Visit Report for 01/26/2020 Debridement Details Patient Name: Date of Service: Nathan Miller, Nathan Miller 01/26/2020 9:15 AM Medical Record [id number] Patient Account Number: [id number] Date of Birth/Sex: 1951/09/18 (68 y.o. M) Treating RN: Levan Hurst Primary Care Provider: PATIENT, NO Other Clinician: Referring Provider: Treating Provider/Extender:Menna Abeln, Otelia Sergeant, Cherre Blanc in Treatment: 4 Debridement Performed for Wound #3 Right,Plantar Calcaneus Assessment: Performed By: Physician Ricard Dillon., MD Debridement Type: Debridement Severity of Tissue Pre Fat layer exposed Debridement: Level of Consciousness (Pre- Awake and Alert procedure): Pre-procedure Verification/Time Out Taken: Yes - 10:35 Start Time: 10:36 Pain Control: Other : benzocaine 20% spray Total Area Debrided (L x W): 2 (cm) x 2 (cm) = 4 (cm) Tissue and other material Viable, Non-Viable, Callus, Slough, Subcutaneous, Skin: Epidermis, Slough debrided: Level: Skin/Subcutaneous Tissue Debridement Description: Excisional Instrument: Curette Bleeding: Minimum Hemostasis Achieved: Pressure End Time: 10:38 Procedural Pain: 0 Post Procedural Pain: 0 Response to Treatment: Procedure was tolerated well Level of Consciousness Awake and Alert (Post-procedure): Post Debridement Measurements of Total Wound Length: (cm) 2 Width: (cm) 1.1 Depth: (cm) 0.7 Volume: (cm) 1.21 Character of Wound/Ulcer Post Improved Debridement: Severity of Tissue Post Debridement: Fat layer exposed Post Procedure Diagnosis Same as Pre-procedure Electronic Signature(s) Signed: 01/26/2020 5:31:36 PM By: Linton Ham MD Signed: 01/30/2020 5:28:43 PM By: Levan Hurst RN, BSN Entered By: Linton Ham on 01/26/2020 11:06:47 -------------------------------------------------------------------------------- HPI Details Patient Name: Date of Service: Nathan Miller 01/26/2020 9:15 AM Medical  Record [id number] Patient Account Number: [id number] Date of Birth/Sex: Treating RN: 10/19/1951 (69 y.o. [name] Primary Care Provider: PATIENT, NO Other Clinician: Referring Provider: Treating Provider/Extender:Imanie Darrow, Otelia Sergeant, Cherre Blanc in Treatment: 4 History of Present Illness Location: right foot Severity: Wag 3 Duration: #months Modifying Factors: Diabetic and charcot' foot.. Peripheral vascular disease Associated Signs and Symptoms: osteo HPI Description: see chief complaint. 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 This is been present for 5-6 years. He underwent a course of IV any biotics and hyperbaric oxygen roughly 5 years ago. He was offered an amputation by orthopedics and I believe at the time I concurred with this however the patient wishes to up go via wait-and-see approach. He is actually done fairly well over the 5 years is still ambulatory. He uses a silver alginate-based dressing on the foot which he changes himself up. 05/27/15; patient returns for his monthly visit the. The status of his foot and his wound are essentially unchanged this patient has a chronic idiopathic neuropathy. He has underlying osteomyelitis. He had extensive orthopedic surgery roughly 3 or 4 years ago had. He has underlying chronic osteomyelitis.[name] He dresses this with silver alginate. We order the supplies. I continue to see him on a monthly basis for wound debridement. 06/24/15 the patient arrives here today for his monthly wound care check. He has underlying chronic osteomyelitis. The wound was surgically debridement of nonviable circumferential tissue. He is continued with silver alginate based stressing severe this area changed daily by his wife. Post debridement this actually looks as good as I've seen this since quite some time 07/29/15. The patient has been  coming here monthly for many years. He has a chronic wound on his right lateral foot at roughly the mid shaft of his fifth metatarsal. Initially he underwent a code course of IV antibiotics hyperbaric oxygen, resection I believe of his fifth and fourth metatarsal for chronic osteomyelitis. He has  Nathan Miller, Nathan Miller ([id number]) Visit Report for 01/26/2020 Debridement Details Patient Name: Date of Service: Nathan Miller, Nathan Miller 01/26/2020 9:15 AM Medical Record [id number] Patient Account Number: [id number] Date of Birth/Sex: 1951/09/18 (68 y.o. M) Treating RN: Levan Hurst Primary Care Provider: PATIENT, NO Other Clinician: Referring Provider: Treating Provider/Extender:Menna Abeln, Otelia Sergeant, Cherre Blanc in Treatment: 4 Debridement Performed for Wound #3 Right,Plantar Calcaneus Assessment: Performed By: Physician Ricard Dillon., MD Debridement Type: Debridement Severity of Tissue Pre Fat layer exposed Debridement: Level of Consciousness (Pre- Awake and Alert procedure): Pre-procedure Verification/Time Out Taken: Yes - 10:35 Start Time: 10:36 Pain Control: Other : benzocaine 20% spray Total Area Debrided (L x W): 2 (cm) x 2 (cm) = 4 (cm) Tissue and other material Viable, Non-Viable, Callus, Slough, Subcutaneous, Skin: Epidermis, Slough debrided: Level: Skin/Subcutaneous Tissue Debridement Description: Excisional Instrument: Curette Bleeding: Minimum Hemostasis Achieved: Pressure End Time: 10:38 Procedural Pain: 0 Post Procedural Pain: 0 Response to Treatment: Procedure was tolerated well Level of Consciousness Awake and Alert (Post-procedure): Post Debridement Measurements of Total Wound Length: (cm) 2 Width: (cm) 1.1 Depth: (cm) 0.7 Volume: (cm) 1.21 Character of Wound/Ulcer Post Improved Debridement: Severity of Tissue Post Debridement: Fat layer exposed Post Procedure Diagnosis Same as Pre-procedure Electronic Signature(s) Signed: 01/26/2020 5:31:36 PM By: Linton Ham MD Signed: 01/30/2020 5:28:43 PM By: Levan Hurst RN, BSN Entered By: Linton Ham on 01/26/2020 11:06:47 -------------------------------------------------------------------------------- HPI Details Patient Name: Date of Service: Nathan Miller 01/26/2020 9:15 AM Medical  Record [id number] Patient Account Number: [id number] Date of Birth/Sex: Treating RN: 10/19/1951 (69 y.o. [name] Primary Care Provider: PATIENT, NO Other Clinician: Referring Provider: Treating Provider/Extender:Imanie Darrow, Otelia Sergeant, Cherre Blanc in Treatment: 4 History of Present Illness Location: right foot Severity: Wag 3 Duration: #months Modifying Factors: Diabetic and charcot' foot.. Peripheral vascular disease Associated Signs and Symptoms: osteo HPI Description: see chief complaint. 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 This is been present for 5-6 years. He underwent a course of IV any biotics and hyperbaric oxygen roughly 5 years ago. He was offered an amputation by orthopedics and I believe at the time I concurred with this however the patient wishes to up go via wait-and-see approach. He is actually done fairly well over the 5 years is still ambulatory. He uses a silver alginate-based dressing on the foot which he changes himself up. 05/27/15; patient returns for his monthly visit the. The status of his foot and his wound are essentially unchanged this patient has a chronic idiopathic neuropathy. He has underlying osteomyelitis. He had extensive orthopedic surgery roughly 3 or 4 years ago had. He has underlying chronic osteomyelitis.[name] He dresses this with silver alginate. We order the supplies. I continue to see him on a monthly basis for wound debridement. 06/24/15 the patient arrives here today for his monthly wound care check. He has underlying chronic osteomyelitis. The wound was surgically debridement of nonviable circumferential tissue. He is continued with silver alginate based stressing severe this area changed daily by his wife. Post debridement this actually looks as good as I've seen this since quite some time 07/29/15. The patient has been  coming here monthly for many years. He has a chronic wound on his right lateral foot at roughly the mid shaft of his fifth metatarsal. Initially he underwent a code course of IV antibiotics hyperbaric oxygen, resection I believe of his fifth and fourth metatarsal for chronic osteomyelitis. He has  3 Right,Plantar Calcaneus Calcium Alginate with Silver Secondary Dressing Wound #3 Right,Plantar Calcaneus Foam - foam donut Kerlix/Rolled Gauze - secure with tape Dry Gauze Off-Loading Turn and reposition every 2 hours Other: - float heels off of bed/chair with pillow under calves Electronic Signature(s) Signed: 01/26/2020 5:31:36 PM By: Linton Ham MD Signed: 01/26/2020 5:31:53 PM By: Baruch Gouty RN, BSN Entered By: Baruch Gouty on 01/26/2020 10:41:52 -------------------------------------------------------------------------------- Problem List Details Patient Name: Date of Service: LEVIN, PULLING 01/26/2020 9:15 AM Medical Record [id number] Patient Account Number: [id number] Date of Birth/Sex: Treating RN: 09-Nov-1950 (69 y.o. 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 [name] Primary Care Provider: PATIENT, NO Other Clinician: Referring Provider: Treating Provider/Extender:Sami Froh, Otelia Sergeant, Cherre Blanc in Treatment: 4 Subjective History of Present Illness (HPI) The following HPI elements were documented for the patient's wound: Location: right foot Severity: Wag 3 Duration: #months Modifying Factors: Diabetic and charcot' foot.. Peripheral vascular disease Associated Signs and Symptoms: osteo see chief complaint. 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 This is been present for 5-6 years. He underwent a course of IV any biotics and hyperbaric oxygen roughly 5 years ago. He was offered an amputation by orthopedics and I believe at the time I concurred with this however the patient wishes to up go via wait-and-see approach. He is actually done fairly well over the 5 years is still ambulatory. He uses a silver alginate-based dressing on the foot which he changes himself up. 05/27/15; patient returns for his monthly visit the. The status of his foot and his wound are essentially unchanged this patient has a chronic idiopathic neuropathy. He has  underlying osteomyelitis. He had extensive orthopedic surgery roughly 3 or 4 years ago had. He has underlying chronic osteomyelitis.[name] He dresses this with silver alginate. We order the supplies. I continue to see him on a monthly basis for wound debridement. 06/24/15 the patient arrives here today for his monthly wound care check. He has underlying chronic osteomyelitis. The wound was surgically debridement of nonviable circumferential tissue. He is continued with silver alginate based stressing severe this area changed daily by his wife. Post debridement this actually looks as good as I've seen this since quite some time 07/29/15. The patient has been coming here monthly for many years. He has a chronic wound on his right lateral foot at roughly the mid shaft of his fifth metatarsal. Initially he underwent a code course of IV antibiotics hyperbaric oxygen, resection I believe of his fifth and fourth metatarsal for chronic osteomyelitis. 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 He was offered an amputation at some point but refused. He comes here for "palliative" wound care which requires extensive  READMISSION 12/29/2019 Mr. Styles is a now 69 year old man who is in this clinic many years ago for osteomyelitis and a wound on his right lateral foot. 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 He has a Charcot foot deformity. He had extensive orthopedic surgery on this foot as well removing under the underlying metatarsals that at the time had osteomyelitis. We followed him for a long period palliatively for a deep wound on the right lateral foot. He had refused an amputation probably 8 or 9 years ago and underwent IV antibiotics and hyperbaric oxygen. Miraculously this wound actually healed over I believe in 2019. The patient has a wound on the right lateral plantar heel which is not in the same spot as the previous wound. This is a weightbearing surface. The wound looks fairly benign but there is significant undermining. No erythema. They state that is started as a blister they used leftover silver alginate, Neosporin and trying to offload this as best he can. He has custom-made shoes with an AFO brace on the right. Past medical history is reviewed predominantly coronary artery disease,  pacemaker, stent, type 2 diabetes with a recent hemoglobin A1c of 6.1. ABI in our clinic was 1.0 on the right 3/22; areas on the right lateral heel not any better than last time. He has some undermining medially. This is undoubtedly a weightbearing surface here. This will not be easy to heal. 3/29; this is a patient with type 2 diabetes and her Charcot foot. He has an area on the plantar heel on the right. We have been using silver alginate. He is his own special boot with a brace. Electronic Signature(s) Signed: 01/26/2020 5:31:36 PM By: Linton Ham MD Entered By: Linton Ham on 01/26/2020 11:08:33 -------------------------------------------------------------------------------- Physical Exam Details Patient Name: Date of Service: HOWELL, BENDON 01/26/2020 9:15 AM Medical Record [id number] Patient Account Number: [id number] Date of Birth/Sex: Treating RN: [birth year] (69 y.o. [name] Primary Care Provider: PATIENT, NO Other Clinician: Referring Provider: Treating Provider/Extender:Nasteho Glantz, Otelia Sergeant, Cherre Blanc in Treatment: 4 Cardiovascular Pedal pulses are palpable. Integumentary (Hair, Skin) There is no erythema around the wound. Notes Wound exam; the area in question is on the right plantar heel. There is undermining medially and superiorly. I think the undermining area is actually quite a bit better but the surface area of the wound is about the same. Rolled edges of skin and subcutaneous tissue removed with a #5 curette. Hemostasis with direct pressure Electronic Signature(s) Signed: 01/26/2020 5:31:36 PM By: Linton Ham MD Entered By: Linton Ham on 01/26/2020 11:09:30 -------------------------------------------------------------------------------- Physician Orders Details Patient Name: Date of Service: JAME, BRIMMER 01/26/2020 9:15 AM Medical Record [id number] Patient Account Number: [id number] Date of Birth/Sex: Treating  RN: [birth year] (68 y.o. Ernestene Mention Primary Care Provider: PATIENT, NO Other Clinician: Referring Provider: Treating Provider/Extender:Hila Bolding, Otelia Sergeant, Cherre Blanc in Treatment: 4 Verbal / Phone Orders: No Diagnosis Coding ICD-10 Coding Code Description E11.621 Type 2 diabetes mellitus with foot ulcer 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 of other part of right foot with fat layer exposed E11.42 Type 2 diabetes mellitus with diabetic polyneuropathy M14.671 Charcot's joint, right ankle and foot Follow-up Appointments Return Appointment in 2 weeks. Dressing Change Frequency Wound #3 Right,Plantar Calcaneus Change dressing every day. Wound Cleansing Wound #3 Right,Plantar Calcaneus May shower and wash wound with soap and water. - on days that dressing is changed Primary Wound Dressing Wound #  time out was conducted at 10:35, prior to the start of the procedure. A Minimum amount of bleeding was controlled with Pressure. The procedure was tolerated well with a pain level of 0 throughout and a pain level of 0 following the procedure. Post Debridement Measurements: 2cm length x 1.1cm width x 0.7cm depth; 1.21cm^3 volume. Character of Wound/Ulcer Post Debridement is improved. Severity of Tissue Post Debridement is: Fat layer exposed. Post procedure Diagnosis Wound #3: Same as Pre-Procedure Plan Follow-up Appointments: Return Appointment in 2 weeks. Dressing Change Frequency: Wound #3 Right,Plantar  Calcaneus: Change dressing every day. Wound Cleansing: Wound #3 Right,Plantar Calcaneus: May shower and wash wound with soap and water. - on days that dressing is changed Primary Wound Dressing: Wound #3 Right,Plantar Calcaneus: Calcium Alginate with Silver Secondary Dressing: Wound #3 Right,Plantar Calcaneus: Foam - foam donut Kerlix/Rolled Gauze - secure with tape Dry Gauze Off-Loading: Turn and reposition every 2 hours Other: - float heels off of bed/chair with pillow under calves 1. Continue with silver alginate this week. We have a foam doughnut, Kerlix roll gauze and his own boot 2. His wife is changing the dressing 3. I talked to him about a total contact cast based on perceived adverse effects he had on this years ago he is not interested. Electronic Signature(s) Signed: 01/26/2020 5:31:36 PM By: Linton Ham MD Entered By: Linton Ham on 01/26/2020 11:10:49 -------------------------------------------------------------------------------- SuperBill Details Patient Name: Date of Service: REGINALD, MCCALEB 01/26/2020 Medical Record [id number] Patient Account Number: [id number] Date of Birth/Sex: Treating RN: [birth year] (69 y.o. 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 of other part of right foot with Modifier: fat layer e Quantity: 1 xposed Physician Procedures CPT4 Code Description: [id number]  - WC PHYS SUBQ TISS 20 SQ CM ICD-10 Diagnosis Description 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 of other part of right foot with Modifier: fat layer e Quantity: 1 xposed Electronic Signature(s) Signed: 01/26/2020 5:31:36 PM By: Linton Ham MD Entered By: Linton Ham on 01/26/2020 11:11:03  3 Right,Plantar Calcaneus Calcium Alginate with Silver Secondary Dressing Wound #3 Right,Plantar Calcaneus Foam - foam donut Kerlix/Rolled Gauze - secure with tape Dry Gauze Off-Loading Turn and reposition every 2 hours Other: - float heels off of bed/chair with pillow under calves Electronic Signature(s) Signed: 01/26/2020 5:31:36 PM By: Linton Ham MD Signed: 01/26/2020 5:31:53 PM By: Baruch Gouty RN, BSN Entered By: Baruch Gouty on 01/26/2020 10:41:52 -------------------------------------------------------------------------------- Problem List Details Patient Name: Date of Service: LEVIN, PULLING 01/26/2020 9:15 AM Medical Record [id number] Patient Account Number: [id number] Date of Birth/Sex: Treating RN: 09-Nov-1950 (69 y.o. 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 [name] Primary Care Provider: PATIENT, NO Other Clinician: Referring Provider: Treating Provider/Extender:Sami Froh, Otelia Sergeant, Cherre Blanc in Treatment: 4 Subjective History of Present Illness (HPI) The following HPI elements were documented for the patient's wound: Location: right foot Severity: Wag 3 Duration: #months Modifying Factors: Diabetic and charcot' foot.. Peripheral vascular disease Associated Signs and Symptoms: osteo see chief complaint. 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 This is been present for 5-6 years. He underwent a course of IV any biotics and hyperbaric oxygen roughly 5 years ago. He was offered an amputation by orthopedics and I believe at the time I concurred with this however the patient wishes to up go via wait-and-see approach. He is actually done fairly well over the 5 years is still ambulatory. He uses a silver alginate-based dressing on the foot which he changes himself up. 05/27/15; patient returns for his monthly visit the. The status of his foot and his wound are essentially unchanged this patient has a chronic idiopathic neuropathy. He has  underlying osteomyelitis. He had extensive orthopedic surgery roughly 3 or 4 years ago had. He has underlying chronic osteomyelitis.[name] He dresses this with silver alginate. We order the supplies. I continue to see him on a monthly basis for wound debridement. 06/24/15 the patient arrives here today for his monthly wound care check. He has underlying chronic osteomyelitis. The wound was surgically debridement of nonviable circumferential tissue. He is continued with silver alginate based stressing severe this area changed daily by his wife. Post debridement this actually looks as good as I've seen this since quite some time 07/29/15. The patient has been coming here monthly for many years. He has a chronic wound on his right lateral foot at roughly the mid shaft of his fifth metatarsal. Initially he underwent a code course of IV antibiotics hyperbaric oxygen, resection I believe of his fifth and fourth metatarsal for chronic osteomyelitis. 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 He was offered an amputation at some point but refused. He comes here for "palliative" wound care which requires extensive  time out was conducted at 10:35, prior to the start of the procedure. A Minimum amount of bleeding was controlled with Pressure. The procedure was tolerated well with a pain level of 0 throughout and a pain level of 0 following the procedure. Post Debridement Measurements: 2cm length x 1.1cm width x 0.7cm depth; 1.21cm^3 volume. Character of Wound/Ulcer Post Debridement is improved. Severity of Tissue Post Debridement is: Fat layer exposed. Post procedure Diagnosis Wound #3: Same as Pre-Procedure Plan Follow-up Appointments: Return Appointment in 2 weeks. Dressing Change Frequency: Wound #3 Right,Plantar  Calcaneus: Change dressing every day. Wound Cleansing: Wound #3 Right,Plantar Calcaneus: May shower and wash wound with soap and water. - on days that dressing is changed Primary Wound Dressing: Wound #3 Right,Plantar Calcaneus: Calcium Alginate with Silver Secondary Dressing: Wound #3 Right,Plantar Calcaneus: Foam - foam donut Kerlix/Rolled Gauze - secure with tape Dry Gauze Off-Loading: Turn and reposition every 2 hours Other: - float heels off of bed/chair with pillow under calves 1. Continue with silver alginate this week. We have a foam doughnut, Kerlix roll gauze and his own boot 2. His wife is changing the dressing 3. I talked to him about a total contact cast based on perceived adverse effects he had on this years ago he is not interested. Electronic Signature(s) Signed: 01/26/2020 5:31:36 PM By: Linton Ham MD Entered By: Linton Ham on 01/26/2020 11:10:49 -------------------------------------------------------------------------------- SuperBill Details Patient Name: Date of Service: REGINALD, MCCALEB 01/26/2020 Medical Record [id number] Patient Account Number: [id number] Date of Birth/Sex: Treating RN: [birth year] (69 y.o. 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 of other part of right foot with Modifier: fat layer e Quantity: 1 xposed Physician Procedures CPT4 Code Description: [id number]  - WC PHYS SUBQ TISS 20 SQ CM ICD-10 Diagnosis Description 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 of other part of right foot with Modifier: fat layer e Quantity: 1 xposed Electronic Signature(s) Signed: 01/26/2020 5:31:36 PM By: Linton Ham MD Entered By: Linton Ham on 01/26/2020 11:11:03  3 Right,Plantar Calcaneus Calcium Alginate with Silver Secondary Dressing Wound #3 Right,Plantar Calcaneus Foam - foam donut Kerlix/Rolled Gauze - secure with tape Dry Gauze Off-Loading Turn and reposition every 2 hours Other: - float heels off of bed/chair with pillow under calves Electronic Signature(s) Signed: 01/26/2020 5:31:36 PM By: Linton Ham MD Signed: 01/26/2020 5:31:53 PM By: Baruch Gouty RN, BSN Entered By: Baruch Gouty on 01/26/2020 10:41:52 -------------------------------------------------------------------------------- Problem List Details Patient Name: Date of Service: LEVIN, PULLING 01/26/2020 9:15 AM Medical Record [id number] Patient Account Number: [id number] Date of Birth/Sex: Treating RN: 09-Nov-1950 (69 y.o. 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 [name] Primary Care Provider: PATIENT, NO Other Clinician: Referring Provider: Treating Provider/Extender:Sami Froh, Otelia Sergeant, Cherre Blanc in Treatment: 4 Subjective History of Present Illness (HPI) The following HPI elements were documented for the patient's wound: Location: right foot Severity: Wag 3 Duration: #months Modifying Factors: Diabetic and charcot' foot.. Peripheral vascular disease Associated Signs and Symptoms: osteo see chief complaint. 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 This is been present for 5-6 years. He underwent a course of IV any biotics and hyperbaric oxygen roughly 5 years ago. He was offered an amputation by orthopedics and I believe at the time I concurred with this however the patient wishes to up go via wait-and-see approach. He is actually done fairly well over the 5 years is still ambulatory. He uses a silver alginate-based dressing on the foot which he changes himself up. 05/27/15; patient returns for his monthly visit the. The status of his foot and his wound are essentially unchanged this patient has a chronic idiopathic neuropathy. He has  underlying osteomyelitis. He had extensive orthopedic surgery roughly 3 or 4 years ago had. He has underlying chronic osteomyelitis.[name] He dresses this with silver alginate. We order the supplies. I continue to see him on a monthly basis for wound debridement. 06/24/15 the patient arrives here today for his monthly wound care check. He has underlying chronic osteomyelitis. The wound was surgically debridement of nonviable circumferential tissue. He is continued with silver alginate based stressing severe this area changed daily by his wife. Post debridement this actually looks as good as I've seen this since quite some time 07/29/15. The patient has been coming here monthly for many years. He has a chronic wound on his right lateral foot at roughly the mid shaft of his fifth metatarsal. Initially he underwent a code course of IV antibiotics hyperbaric oxygen, resection I believe of his fifth and fourth metatarsal for chronic osteomyelitis. 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 He was offered an amputation at some point but refused. He comes here for "palliative" wound care which requires extensive  3 Right,Plantar Calcaneus Calcium Alginate with Silver Secondary Dressing Wound #3 Right,Plantar Calcaneus Foam - foam donut Kerlix/Rolled Gauze - secure with tape Dry Gauze Off-Loading Turn and reposition every 2 hours Other: - float heels off of bed/chair with pillow under calves Electronic Signature(s) Signed: 01/26/2020 5:31:36 PM By: Linton Ham MD Signed: 01/26/2020 5:31:53 PM By: Baruch Gouty RN, BSN Entered By: Baruch Gouty on 01/26/2020 10:41:52 -------------------------------------------------------------------------------- Problem List Details Patient Name: Date of Service: LEVIN, PULLING 01/26/2020 9:15 AM Medical Record [id number] Patient Account Number: [id number] Date of Birth/Sex: Treating RN: 09-Nov-1950 (69 y.o. 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 [name] Primary Care Provider: PATIENT, NO Other Clinician: Referring Provider: Treating Provider/Extender:Sami Froh, Otelia Sergeant, Cherre Blanc in Treatment: 4 Subjective History of Present Illness (HPI) The following HPI elements were documented for the patient's wound: Location: right foot Severity: Wag 3 Duration: #months Modifying Factors: Diabetic and charcot' foot.. Peripheral vascular disease Associated Signs and Symptoms: osteo see chief complaint. 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 This is been present for 5-6 years. He underwent a course of IV any biotics and hyperbaric oxygen roughly 5 years ago. He was offered an amputation by orthopedics and I believe at the time I concurred with this however the patient wishes to up go via wait-and-see approach. He is actually done fairly well over the 5 years is still ambulatory. He uses a silver alginate-based dressing on the foot which he changes himself up. 05/27/15; patient returns for his monthly visit the. The status of his foot and his wound are essentially unchanged this patient has a chronic idiopathic neuropathy. He has  underlying osteomyelitis. He had extensive orthopedic surgery roughly 3 or 4 years ago had. He has underlying chronic osteomyelitis.[name] He dresses this with silver alginate. We order the supplies. I continue to see him on a monthly basis for wound debridement. 06/24/15 the patient arrives here today for his monthly wound care check. He has underlying chronic osteomyelitis. The wound was surgically debridement of nonviable circumferential tissue. He is continued with silver alginate based stressing severe this area changed daily by his wife. Post debridement this actually looks as good as I've seen this since quite some time 07/29/15. The patient has been coming here monthly for many years. He has a chronic wound on his right lateral foot at roughly the mid shaft of his fifth metatarsal. Initially he underwent a code course of IV antibiotics hyperbaric oxygen, resection I believe of his fifth and fourth metatarsal for chronic osteomyelitis. 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 He was offered an amputation at some point but refused. He comes here for "palliative" wound care which requires extensive  time out was conducted at 10:35, prior to the start of the procedure. A Minimum amount of bleeding was controlled with Pressure. The procedure was tolerated well with a pain level of 0 throughout and a pain level of 0 following the procedure. Post Debridement Measurements: 2cm length x 1.1cm width x 0.7cm depth; 1.21cm^3 volume. Character of Wound/Ulcer Post Debridement is improved. Severity of Tissue Post Debridement is: Fat layer exposed. Post procedure Diagnosis Wound #3: Same as Pre-Procedure Plan Follow-up Appointments: Return Appointment in 2 weeks. Dressing Change Frequency: Wound #3 Right,Plantar  Calcaneus: Change dressing every day. Wound Cleansing: Wound #3 Right,Plantar Calcaneus: May shower and wash wound with soap and water. - on days that dressing is changed Primary Wound Dressing: Wound #3 Right,Plantar Calcaneus: Calcium Alginate with Silver Secondary Dressing: Wound #3 Right,Plantar Calcaneus: Foam - foam donut Kerlix/Rolled Gauze - secure with tape Dry Gauze Off-Loading: Turn and reposition every 2 hours Other: - float heels off of bed/chair with pillow under calves 1. Continue with silver alginate this week. We have a foam doughnut, Kerlix roll gauze and his own boot 2. His wife is changing the dressing 3. I talked to him about a total contact cast based on perceived adverse effects he had on this years ago he is not interested. Electronic Signature(s) Signed: 01/26/2020 5:31:36 PM By: Linton Ham MD Entered By: Linton Ham on 01/26/2020 11:10:49 -------------------------------------------------------------------------------- SuperBill Details Patient Name: Date of Service: REGINALD, MCCALEB 01/26/2020 Medical Record [id number] Patient Account Number: [id number] Date of Birth/Sex: Treating RN: [birth year] (69 y.o. 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 of other part of right foot with Modifier: fat layer e Quantity: 1 xposed Physician Procedures CPT4 Code Description: [id number]  - WC PHYS SUBQ TISS 20 SQ CM ICD-10 Diagnosis Description 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 of other part of right foot with Modifier: fat layer e Quantity: 1 xposed Electronic Signature(s) Signed: 01/26/2020 5:31:36 PM By: Linton Ham MD Entered By: Linton Ham on 01/26/2020 11:11:03  Nathan Miller, Nathan Miller ([id number]) Visit Report for 01/26/2020 Debridement Details Patient Name: Date of Service: Nathan Miller, Nathan Miller 01/26/2020 9:15 AM Medical Record [id number] Patient Account Number: [id number] Date of Birth/Sex: 1951/09/18 (68 y.o. M) Treating RN: Levan Hurst Primary Care Provider: PATIENT, NO Other Clinician: Referring Provider: Treating Provider/Extender:Menna Abeln, Otelia Sergeant, Cherre Blanc in Treatment: 4 Debridement Performed for Wound #3 Right,Plantar Calcaneus Assessment: Performed By: Physician Ricard Dillon., MD Debridement Type: Debridement Severity of Tissue Pre Fat layer exposed Debridement: Level of Consciousness (Pre- Awake and Alert procedure): Pre-procedure Verification/Time Out Taken: Yes - 10:35 Start Time: 10:36 Pain Control: Other : benzocaine 20% spray Total Area Debrided (L x W): 2 (cm) x 2 (cm) = 4 (cm) Tissue and other material Viable, Non-Viable, Callus, Slough, Subcutaneous, Skin: Epidermis, Slough debrided: Level: Skin/Subcutaneous Tissue Debridement Description: Excisional Instrument: Curette Bleeding: Minimum Hemostasis Achieved: Pressure End Time: 10:38 Procedural Pain: 0 Post Procedural Pain: 0 Response to Treatment: Procedure was tolerated well Level of Consciousness Awake and Alert (Post-procedure): Post Debridement Measurements of Total Wound Length: (cm) 2 Width: (cm) 1.1 Depth: (cm) 0.7 Volume: (cm) 1.21 Character of Wound/Ulcer Post Improved Debridement: Severity of Tissue Post Debridement: Fat layer exposed Post Procedure Diagnosis Same as Pre-procedure Electronic Signature(s) Signed: 01/26/2020 5:31:36 PM By: Linton Ham MD Signed: 01/30/2020 5:28:43 PM By: Levan Hurst RN, BSN Entered By: Linton Ham on 01/26/2020 11:06:47 -------------------------------------------------------------------------------- HPI Details Patient Name: Date of Service: Nathan Miller 01/26/2020 9:15 AM Medical  Record [id number] Patient Account Number: [id number] Date of Birth/Sex: Treating RN: 10/19/1951 (69 y.o. [name] Primary Care Provider: PATIENT, NO Other Clinician: Referring Provider: Treating Provider/Extender:Imanie Darrow, Otelia Sergeant, Cherre Blanc in Treatment: 4 History of Present Illness Location: right foot Severity: Wag 3 Duration: #months Modifying Factors: Diabetic and charcot' foot.. Peripheral vascular disease Associated Signs and Symptoms: osteo HPI Description: see chief complaint. 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 This is been present for 5-6 years. He underwent a course of IV any biotics and hyperbaric oxygen roughly 5 years ago. He was offered an amputation by orthopedics and I believe at the time I concurred with this however the patient wishes to up go via wait-and-see approach. He is actually done fairly well over the 5 years is still ambulatory. He uses a silver alginate-based dressing on the foot which he changes himself up. 05/27/15; patient returns for his monthly visit the. The status of his foot and his wound are essentially unchanged this patient has a chronic idiopathic neuropathy. He has underlying osteomyelitis. He had extensive orthopedic surgery roughly 3 or 4 years ago had. He has underlying chronic osteomyelitis.[name] He dresses this with silver alginate. We order the supplies. I continue to see him on a monthly basis for wound debridement. 06/24/15 the patient arrives here today for his monthly wound care check. He has underlying chronic osteomyelitis. The wound was surgically debridement of nonviable circumferential tissue. He is continued with silver alginate based stressing severe this area changed daily by his wife. Post debridement this actually looks as good as I've seen this since quite some time 07/29/15. The patient has been  coming here monthly for many years. He has a chronic wound on his right lateral foot at roughly the mid shaft of his fifth metatarsal. Initially he underwent a code course of IV antibiotics hyperbaric oxygen, resection I believe of his fifth and fourth metatarsal for chronic osteomyelitis. He has  time out was conducted at 10:35, prior to the start of the procedure. A Minimum amount of bleeding was controlled with Pressure. The procedure was tolerated well with a pain level of 0 throughout and a pain level of 0 following the procedure. Post Debridement Measurements: 2cm length x 1.1cm width x 0.7cm depth; 1.21cm^3 volume. Character of Wound/Ulcer Post Debridement is improved. Severity of Tissue Post Debridement is: Fat layer exposed. Post procedure Diagnosis Wound #3: Same as Pre-Procedure Plan Follow-up Appointments: Return Appointment in 2 weeks. Dressing Change Frequency: Wound #3 Right,Plantar  Calcaneus: Change dressing every day. Wound Cleansing: Wound #3 Right,Plantar Calcaneus: May shower and wash wound with soap and water. - on days that dressing is changed Primary Wound Dressing: Wound #3 Right,Plantar Calcaneus: Calcium Alginate with Silver Secondary Dressing: Wound #3 Right,Plantar Calcaneus: Foam - foam donut Kerlix/Rolled Gauze - secure with tape Dry Gauze Off-Loading: Turn and reposition every 2 hours Other: - float heels off of bed/chair with pillow under calves 1. Continue with silver alginate this week. We have a foam doughnut, Kerlix roll gauze and his own boot 2. His wife is changing the dressing 3. I talked to him about a total contact cast based on perceived adverse effects he had on this years ago he is not interested. Electronic Signature(s) Signed: 01/26/2020 5:31:36 PM By: Linton Ham MD Entered By: Linton Ham on 01/26/2020 11:10:49 -------------------------------------------------------------------------------- SuperBill Details Patient Name: Date of Service: REGINALD, MCCALEB 01/26/2020 Medical Record [id number] Patient Account Number: [id number] Date of Birth/Sex: Treating RN: [birth year] (69 y.o. 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 of other part of right foot with Modifier: fat layer e Quantity: 1 xposed Physician Procedures CPT4 Code Description: [id number]  - WC PHYS SUBQ TISS 20 SQ CM ICD-10 Diagnosis Description 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 of other part of right foot with Modifier: fat layer e Quantity: 1 xposed Electronic Signature(s) Signed: 01/26/2020 5:31:36 PM By: Linton Ham MD Entered By: Linton Ham on 01/26/2020 11:11:03  3 Right,Plantar Calcaneus Calcium Alginate with Silver Secondary Dressing Wound #3 Right,Plantar Calcaneus Foam - foam donut Kerlix/Rolled Gauze - secure with tape Dry Gauze Off-Loading Turn and reposition every 2 hours Other: - float heels off of bed/chair with pillow under calves Electronic Signature(s) Signed: 01/26/2020 5:31:36 PM By: Linton Ham MD Signed: 01/26/2020 5:31:53 PM By: Baruch Gouty RN, BSN Entered By: Baruch Gouty on 01/26/2020 10:41:52 -------------------------------------------------------------------------------- Problem List Details Patient Name: Date of Service: LEVIN, PULLING 01/26/2020 9:15 AM Medical Record [id number] Patient Account Number: [id number] Date of Birth/Sex: Treating RN: 09-Nov-1950 (69 y.o. 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 [name] Primary Care Provider: PATIENT, NO Other Clinician: Referring Provider: Treating Provider/Extender:Sami Froh, Otelia Sergeant, Cherre Blanc in Treatment: 4 Subjective History of Present Illness (HPI) The following HPI elements were documented for the patient's wound: Location: right foot Severity: Wag 3 Duration: #months Modifying Factors: Diabetic and charcot' foot.. Peripheral vascular disease Associated Signs and Symptoms: osteo see chief complaint. 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 This is been present for 5-6 years. He underwent a course of IV any biotics and hyperbaric oxygen roughly 5 years ago. He was offered an amputation by orthopedics and I believe at the time I concurred with this however the patient wishes to up go via wait-and-see approach. He is actually done fairly well over the 5 years is still ambulatory. He uses a silver alginate-based dressing on the foot which he changes himself up. 05/27/15; patient returns for his monthly visit the. The status of his foot and his wound are essentially unchanged this patient has a chronic idiopathic neuropathy. He has  underlying osteomyelitis. He had extensive orthopedic surgery roughly 3 or 4 years ago had. He has underlying chronic osteomyelitis.[name] He dresses this with silver alginate. We order the supplies. I continue to see him on a monthly basis for wound debridement. 06/24/15 the patient arrives here today for his monthly wound care check. He has underlying chronic osteomyelitis. The wound was surgically debridement of nonviable circumferential tissue. He is continued with silver alginate based stressing severe this area changed daily by his wife. Post debridement this actually looks as good as I've seen this since quite some time 07/29/15. The patient has been coming here monthly for many years. He has a chronic wound on his right lateral foot at roughly the mid shaft of his fifth metatarsal. Initially he underwent a code course of IV antibiotics hyperbaric oxygen, resection I believe of his fifth and fourth metatarsal for chronic osteomyelitis. 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 He was offered an amputation at some point but refused. He comes here for "palliative" wound care which requires extensive

## 2020-01-30 NOTE — Progress Notes (Signed)
Nathan Miller, Nathan Miller ([id number]) Visit Report for 01/26/2020 Arrival Information Details Patient Name: Date of Service: Nathan Miller, Nathan Miller 01/26/2020 9:15 AM Medical Record [id number] Patient Account Number: [id number] Date of Birth/Sex: Treating RN: 04-01-1951 (69 y.o. [name] Primary Care Jailan Trimm: PATIENT, NO Other Clinician: Referring Ismail Graziani: Treating Adon Gehlhausen/Extender:Robson, Otelia Sergeant, Cherre Blanc in Treatment: 4 Visit Information History Since Last Visit Cane Added or deleted any medications: No Patient Arrived: 09:08 Any new allergies or adverse reactions: No Arrival Time: Had a fall or experienced change in No Accompanied By: wife None activities of daily living that may affect Transfer Assistance: risk of falls: Patient Identification Verified: Yes Signs or symptoms of abuse/neglect since last No Secondary Verification Process Completed: Yes visito Patient Requires Transmission-Based No Hospitalized since last visit: No Precautions: Implantable device outside of the clinic excluding No Patient Has Alerts: No cellular tissue based products placed in the center since last visit: Has Dressing in Place as Prescribed: Yes Pain Present Now: No Electronic Signature(s) Signed: 01/26/2020 5:06:29 PM By: Kela Millin Entered By: Kela Millin on 01/26/2020 09:13:36 -------------------------------------------------------------------------------- Encounter Discharge Information Details Patient Name: Date of Service: Nathan Eastern D. 01/26/2020 9:15 AM Medical Record [id number] Patient Account Number: [id number] Date of Birth/Sex: Treating RN: 1950/12/17 (69 y.o. [name] Primary Care Lavetta Geier: PATIENT, NO Other Clinician: Referring Cederick Broadnax: Treating Prim Morace/Extender:Robson, Otelia Sergeant, Cherre Blanc in Treatment: 4 Encounter Discharge Information Items Post Procedure Vitals Discharge Condition: Stable Temperature (F):  98 Ambulatory Status: Cane Pulse (bpm): 75 Discharge Destination: Home Respiratory Rate (breaths/min): 19 Transportation: Private Auto Blood Pressure (mmHg): 159/78 Accompanied By: family member Schedule Follow-up Appointment: Yes Clinical Summary of Care: Electronic Signature(s) Signed: 01/26/2020 4:48:26 PM By: Deon Pilling Entered By: Deon Pilling on 01/26/2020 10:51:00 -------------------------------------------------------------------------------- Lower Extremity Assessment Details Patient Name: Date of Service: Nathan Miller, Nathan Miller 01/26/2020 9:15 AM Medical Record [id number] Patient Account Number: [id number] Date of Birth/Sex: Treating RN: 1951-06-19 (69 y.o. [name] Primary Care Anarosa Kubisiak: PATIENT, NO Other Clinician: Referring Zavon Hyson: Treating Gladine Plude/Extender:Robson, Otelia Sergeant, Cherre Blanc in Treatment: 4 Edema Assessment Assessed: [Left: No] [Right: No] Edema: [Left: Ye] [Right: s] Calf Left: Right: Point of Measurement: cm From Medial Instep cm 42.5 cm Ankle Left: Right: Point of Measurement: cm From Medial Instep cm 28.5 cm Vascular Assessment Pulses: Dorsalis Pedis Palpable: [Right:Yes] Electronic Signature(s) Signed: 01/26/2020 5:06:29 PM By: Kela Millin Entered By: Kela Millin on 01/26/2020 09:15:04 -------------------------------------------------------------------------------- Multi Wound Chart Details Patient Name: Date of Service: Nathan Eastern D. 01/26/2020 9:15 AM Medical Record [id number] Patient Account Number: [id number] Date of Birth/Sex: Treating RN: [birth year] (69 y.o. 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] Comorbid History: [3:Coronary Artery Disease, N/A Hypertension, Type II Diabetes, Osteoarthritis, Osteomyelitis, Neuropathy, Confinement Anxiety] Date Acquired: [3:09/30/2019] [N/A:N/A] Weeks of Treatment: [3:4] [N/A:N/A] Wound Status: [3:Open] [N/A:N/A] Measurements L x W x D 1.6x1x0.7 [N/A:N/A] (cm) Area (cm) : [3:1.257] [N/A:N/A] Volume (cm) : [3:0.88] [N/A:N/A] % Reduction in Area: [3:23.10%] [N/A:N/A] % Reduction in Volume: 10.20% [N/A:N/A] Starting Position 1 12 (o'clock): Ending Position 1 [3:12] (o'clock): Maximum Distance 1 [3:0.6] (cm): Undermining: [3:Yes] [N/A:N/A] Classification: [3:Grade 2] [N/A:N/A] Exudate Amount: [3:Medium] [N/A:N/A] Exudate Type: [3:Serosanguineous] [N/A:N/A] Exudate Color: [3:red,  brown] [N/A:N/A] Wound Margin: [3:Thickened] [N/A:N/A] Granulation Amount: [3:Large (67-100%)] [N/A:N/A] Granulation Quality: [3:Red, Pink] [N/A:N/A] Necrotic Amount: [3:None Present (0%)] [N/A:N/A] Exposed Structures: [3:Fat Layer (Subcutaneous N/A Tissue) Exposed: Yes Fascia: No Tendon: No Muscle: No Joint: No Bone: No] Epithelialization: [3:None] [N/A:N/A] Debridement: [3:Debridement - Excisional N/A] Pre-procedure [3:10:35] [N/A:N/A] Verification/Time Out Taken: Pain Control: [3:Other] [N/A:N/A] Tissue Debrided: [3:Callus, Subcutaneous, Slough] [N/A:N/A] Level: [3:Skin/Subcutaneous Tissue] [N/A:N/A] Debridement Area (sq cm):4 [N/A:N/A] Instrument: [3:Curette] [N/A:N/A] Bleeding: [3:Minimum] [N/A:N/A] Hemostasis Achieved: [3:Pressure] [N/A:N/A] Procedural Pain: [3:0] [N/A:N/A] Post Procedural Pain: [3:0] [N/A:N/A] Debridement Treatment Procedure was tolerated [N/A:N/A] Response: [3:well] Post Debridement [3:2x1.1x0.7]  [N/A:N/A] Measurements L x W x D (cm) Post Debridement [3:1.21] [N/A:N/A] Volume: (cm) Procedures Performed: Debridement [N/A:N/A] Treatment Notes Wound #3 (Right, Plantar Calcaneus) 1. Cleanse With Wound Cleanser 3. Primary Dressing Applied Calcium Alginate Ag 4. Secondary Dressing Dry Gauze Roll Gauze Foam 5. Secured With Medipore tape Notes foam donut as secpndary. Electronic Signature(s) Signed: 01/26/2020 5:31:36 PM By: Linton Ham MD Signed: 01/30/2020 5:28:43 PM By: Levan Hurst RN, BSN Entered By: Linton Ham on 01/26/2020 11:06:35 -------------------------------------------------------------------------------- Multi-Disciplinary Care Plan Details Patient Name: Date of Service: Nathan Miller, Nathan Miller 01/26/2020 9:15 AM Medical Record [id number] Patient Account Number: [id number] Date of Birth/Sex: Treating RN: Mar 23, 1951 (69 y.o. Ernestene Mention Primary Care Lula Michaux: PATIENT, NO Other Clinician: Referring Ayush Boulet: Treating Jawuan Robb/Extender:Robson, Otelia Sergeant, Cherre Blanc in Treatment: 4 Active Inactive Nutrition Nursing Diagnoses: Impaired glucose control: actual or potential Potential for alteratiion in Nutrition/Potential for imbalanced nutrition Goals: Patient/caregiver agrees to and verbalizes understanding of need to use nutritional supplements and/or vitamins as prescribed Date Initiated: 12/29/2019 Target Resolution Date: 01/30/2020 Goal Status: Active Patient/caregiver will maintain therapeutic glucose control Date Initiated: 12/29/2019 Target Resolution Date: 01/30/2020 Goal Status: Active Interventions: Assess HgA1c results as ordered upon admission and as needed Assess patient nutrition upon admission and as needed per policy Provide education on elevated blood sugars and impact on wound healing Provide education on nutrition Treatment Activities: Education provided on Nutrition : 12/29/2019 Notes: Pressure Nursing  Diagnoses: Knowledge deficit related to causes and risk factors for pressure ulcer development Knowledge deficit related to management of pressures ulcers Potential for impaired tissue integrity related to pressure, friction, moisture, and shear Goals: Patient/caregiver will verbalize risk factors for pressure ulcer development Date Initiated: 12/29/2019 Target Resolution Date: 01/30/2020 Goal Status: Active Patient/caregiver will verbalize understanding of pressure ulcer management Date Initiated: 12/29/2019 Target Resolution Date: 01/30/2020 Goal Status: Active Interventions: Assess: immobility, friction, shearing, incontinence upon admission and as needed Assess offloading mechanisms upon admission and as needed Assess potential for pressure ulcer upon admission and as needed Provide education on pressure ulcers Notes: Wound/Skin Impairment Nursing Diagnoses: Impaired tissue integrity Knowledge deficit related to ulceration/compromised skin integrity Goals: Patient/caregiver will verbalize understanding of skin care regimen Date Initiated: 12/29/2019 Target Resolution Date: 01/30/2020 Goal Status: Active Ulcer/skin breakdown will have a volume reduction of 30% by week 4 Date Initiated: 12/29/2019 Target Resolution Date: 01/30/2020 Goal Status: Active Interventions: Assess patient/caregiver ability to obtain necessary supplies Assess patient/caregiver ability to perform ulcer/skin care regimen upon admission and as needed Assess ulceration(s) every visit Provide education on ulcer and skin care Notes: Electronic Signature(s) Signed: 01/26/2020 5:31:53 PM By: Baruch Gouty RN, BSN Entered By: Baruch Gouty on 01/26/2020 09:38:49 -------------------------------------------------------------------------------- Pain Assessment Details Patient Name: Date of Service: Nathan Miller 01/26/2020 9:15 AM Medical Record [id number] Patient Account Number: [id number] Date of  Birth/Sex: Treating RN: 06/25/1951 (69 y.o. [name] Primary Care Ayisha Pol: PATIENT, NO Other  brown] [N/A:N/A] Wound Margin: [3:Thickened] [N/A:N/A] Granulation Amount: [3:Large (67-100%)] [N/A:N/A] Granulation Quality: [3:Red, Pink] [N/A:N/A] Necrotic Amount: [3:None Present (0%)] [N/A:N/A] Exposed Structures: [3:Fat Layer (Subcutaneous N/A Tissue) Exposed: Yes Fascia: No Tendon: No Muscle: No Joint: No Bone: No] Epithelialization: [3:None] [N/A:N/A] Debridement: [3:Debridement - Excisional N/A] Pre-procedure [3:10:35] [N/A:N/A] Verification/Time Out Taken: Pain Control: [3:Other] [N/A:N/A] Tissue Debrided: [3:Callus, Subcutaneous, Slough] [N/A:N/A] Level: [3:Skin/Subcutaneous Tissue] [N/A:N/A] Debridement Area (sq cm):4 [N/A:N/A] Instrument: [3:Curette] [N/A:N/A] Bleeding: [3:Minimum] [N/A:N/A] Hemostasis Achieved: [3:Pressure] [N/A:N/A] Procedural Pain: [3:0] [N/A:N/A] Post Procedural Pain: [3:0] [N/A:N/A] Debridement Treatment Procedure was tolerated [N/A:N/A] Response: [3:well] Post Debridement [3:2x1.1x0.7]  [N/A:N/A] Measurements L x W x D (cm) Post Debridement [3:1.21] [N/A:N/A] Volume: (cm) Procedures Performed: Debridement [N/A:N/A] Treatment Notes Wound #3 (Right, Plantar Calcaneus) 1. Cleanse With Wound Cleanser 3. Primary Dressing Applied Calcium Alginate Ag 4. Secondary Dressing Dry Gauze Roll Gauze Foam 5. Secured With Medipore tape Notes foam donut as secpndary. Electronic Signature(s) Signed: 01/26/2020 5:31:36 PM By: Linton Ham MD Signed: 01/30/2020 5:28:43 PM By: Levan Hurst RN, BSN Entered By: Linton Ham on 01/26/2020 11:06:35 -------------------------------------------------------------------------------- Multi-Disciplinary Care Plan Details Patient Name: Date of Service: Nathan Miller, Nathan Miller 01/26/2020 9:15 AM Medical Record [id number] Patient Account Number: [id number] Date of Birth/Sex: Treating RN: Mar 23, 1951 (69 y.o. Ernestene Mention Primary Care Lula Michaux: PATIENT, NO Other Clinician: Referring Ayush Boulet: Treating Jawuan Robb/Extender:Robson, Otelia Sergeant, Cherre Blanc in Treatment: 4 Active Inactive Nutrition Nursing Diagnoses: Impaired glucose control: actual or potential Potential for alteratiion in Nutrition/Potential for imbalanced nutrition Goals: Patient/caregiver agrees to and verbalizes understanding of need to use nutritional supplements and/or vitamins as prescribed Date Initiated: 12/29/2019 Target Resolution Date: 01/30/2020 Goal Status: Active Patient/caregiver will maintain therapeutic glucose control Date Initiated: 12/29/2019 Target Resolution Date: 01/30/2020 Goal Status: Active Interventions: Assess HgA1c results as ordered upon admission and as needed Assess patient nutrition upon admission and as needed per policy Provide education on elevated blood sugars and impact on wound healing Provide education on nutrition Treatment Activities: Education provided on Nutrition : 12/29/2019 Notes: Pressure Nursing  Diagnoses: Knowledge deficit related to causes and risk factors for pressure ulcer development Knowledge deficit related to management of pressures ulcers Potential for impaired tissue integrity related to pressure, friction, moisture, and shear Goals: Patient/caregiver will verbalize risk factors for pressure ulcer development Date Initiated: 12/29/2019 Target Resolution Date: 01/30/2020 Goal Status: Active Patient/caregiver will verbalize understanding of pressure ulcer management Date Initiated: 12/29/2019 Target Resolution Date: 01/30/2020 Goal Status: Active Interventions: Assess: immobility, friction, shearing, incontinence upon admission and as needed Assess offloading mechanisms upon admission and as needed Assess potential for pressure ulcer upon admission and as needed Provide education on pressure ulcers Notes: Wound/Skin Impairment Nursing Diagnoses: Impaired tissue integrity Knowledge deficit related to ulceration/compromised skin integrity Goals: Patient/caregiver will verbalize understanding of skin care regimen Date Initiated: 12/29/2019 Target Resolution Date: 01/30/2020 Goal Status: Active Ulcer/skin breakdown will have a volume reduction of 30% by week 4 Date Initiated: 12/29/2019 Target Resolution Date: 01/30/2020 Goal Status: Active Interventions: Assess patient/caregiver ability to obtain necessary supplies Assess patient/caregiver ability to perform ulcer/skin care regimen upon admission and as needed Assess ulceration(s) every visit Provide education on ulcer and skin care Notes: Electronic Signature(s) Signed: 01/26/2020 5:31:53 PM By: Baruch Gouty RN, BSN Entered By: Baruch Gouty on 01/26/2020 09:38:49 -------------------------------------------------------------------------------- Pain Assessment Details Patient Name: Date of Service: Nathan Miller 01/26/2020 9:15 AM Medical Record [id number] Patient Account Number: [id number] Date of  Birth/Sex: Treating RN: 06/25/1951 (69 y.o. [name] Primary Care Ayisha Pol: PATIENT, NO Other

## 2020-02-09 ENCOUNTER — Other Ambulatory Visit: Payer: Self-pay

## 2020-02-09 ENCOUNTER — Encounter (HOSPITAL_BASED_OUTPATIENT_CLINIC_OR_DEPARTMENT_OTHER): Payer: PPO | Attending: Internal Medicine | Admitting: Internal Medicine

## 2020-02-09 DIAGNOSIS — E1151 Type 2 diabetes mellitus with diabetic peripheral angiopathy without gangrene: Secondary | ICD-10-CM | POA: Insufficient documentation

## 2020-02-09 DIAGNOSIS — E11621 Type 2 diabetes mellitus with foot ulcer: Secondary | ICD-10-CM | POA: Diagnosis not present

## 2020-02-09 DIAGNOSIS — E1142 Type 2 diabetes mellitus with diabetic polyneuropathy: Secondary | ICD-10-CM | POA: Diagnosis not present

## 2020-02-09 DIAGNOSIS — M14671 Charcot's joint, right ankle and foot: Secondary | ICD-10-CM | POA: Diagnosis not present

## 2020-02-09 DIAGNOSIS — E11610 Type 2 diabetes mellitus with diabetic neuropathic arthropathy: Secondary | ICD-10-CM | POA: Diagnosis not present

## 2020-02-09 DIAGNOSIS — L97512 Non-pressure chronic ulcer of other part of right foot with fat layer exposed: Secondary | ICD-10-CM | POA: Diagnosis not present

## 2020-02-10 NOTE — Progress Notes (Signed)
KHYSON, FRANCAVILLA ([id number]) Visit Report for 02/09/2020 HPI Details Patient Name: Date of Service: Nathan Miller, Nathan Miller 02/09/2020 9:15 AM Medical Record [id number] Patient Account Number: [id number] Date of Birth/Sex: Treating RN: 11-Jul-1951 (69 y.o. [name] Primary Care Provider: PATIENT, NO Other Clinician: Referring Provider: Treating Provider/Extender:Kenn Rekowski, Otelia Sergeant, Cherre Blanc in Treatment: 6 History of Present Illness Location: right foot Severity: Wag 3 Duration: #months Modifying Factors: Diabetic and charcot' foot.. Peripheral vascular disease Associated Signs and Symptoms: osteo HPI Description: see chief complaint. 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 This is been present for 5-6 years. He underwent a course of IV any biotics and hyperbaric oxygen roughly 5 years ago. He was offered an amputation by orthopedics and I believe at the time I concurred with this however the patient wishes to up go via wait-and-see approach. He is actually done fairly well over the 5 years is still ambulatory. He uses a silver alginate-based dressing on the foot which he changes himself up. 05/27/15; patient returns for his monthly visit the. The status of his foot and his wound are essentially unchanged this patient has a chronic idiopathic neuropathy. He has underlying osteomyelitis. He had extensive orthopedic surgery roughly 3 or 4 years ago had. He has underlying chronic osteomyelitis.[name] He dresses this with silver alginate. We order the supplies. I continue to see him on a monthly basis for wound debridement. 06/24/15 the patient arrives here today for his monthly wound care check. He has underlying chronic osteomyelitis. The wound was surgically debridement of nonviable circumferential tissue. He is continued with silver alginate based stressing severe this area  changed daily by his wife. Post debridement this actually looks as good as I've seen this since quite some time 07/29/15. The patient has been coming here monthly for many years. He has a chronic wound on his right lateral foot at roughly the mid shaft of his fifth metatarsal. Initially he underwent a code course of IV antibiotics hyperbaric oxygen, resection I believe of his fifth and fourth metatarsal for chronic osteomyelitis. 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 He was offered an amputation at some point but refused. He comes here for "palliative" wound care which requires extensive debridement of necrotic subcutaneous tissue to clean up the wound bed. He has been using silver alginate based dressings at home for many years. Although he has extensive edema of his foot, the situation has overall remained stable perhaps surprisingly so. 09/16/15; the patient has been coming here for many years. He is a chronic wound in his right lateral foot roughly the mid shaft of the fifth metatarsal. He is had previous surgery on this with resection. He clearly has underlying chronic osteomyelitis. An amputation was recommended years ago which she refused MRI to list the he has done exceptionally well with "palliative" wound dressings. He comes in to see me about every 4-6 weeks for debridement of nonviable tissue surface eschar and he has been using silver alginate based dressings. 10/14/15; the patient is here for his maintenance surgical debridement of the non-healing wound on the lateral aspect of his right foot. He has underlying osteomyelitis but is miraculously managed to hold on to this foot in spite of all odds. He completed aggressive management including surgery, hyperbarics perhaps 4 years ago. Unfortunately this never closed. There continues to be swelling of the foot however this has been stable for a long time. 11/25/15;  Yes Inactive Problems Resolved Problems Electronic Signature(s) Signed: 02/10/2020 5:51:00 PM By: Linton Ham MD Entered By: Linton Ham on 02/09/2020 09:45:25 -------------------------------------------------------------------------------- Progress Note Details Patient Name: Date of Service: Nathan Eastern D. 02/09/2020 9:15 AM Medical Record [id number] Patient Account Number: [id number] Date of Birth/Sex: Treating RN: 1951/03/24 (69 y.o. [name] Primary Care Provider: Other Clinician: PATIENT, NO Referring Provider: Treating Provider/Extender:Marialy Urbanczyk, Otelia Sergeant, Cherre Blanc in Treatment: 6 Subjective History of Present Illness (HPI) The following HPI elements were documented for the patient's wound: Location: right foot Severity: Wag 3 Duration: #months Modifying Factors: Diabetic and charcot' foot.. Peripheral vascular disease Associated Signs and Symptoms: osteo see chief complaint. 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 This is been present for 5-6 years. He underwent a course of IV any biotics and hyperbaric oxygen roughly 5 years ago. He was offered an amputation by orthopedics  and I believe at the time I concurred with this however the patient wishes to up go via wait-and-see approach. He is actually done fairly well over the 5 years is still ambulatory. He uses a silver alginate-based dressing on the foot which he changes himself up. 05/27/15; patient returns for his monthly visit the. The status of his foot and his wound are essentially unchanged this patient has a chronic idiopathic neuropathy. He has underlying osteomyelitis. He had extensive orthopedic surgery roughly 3 or 4 years ago had. He has underlying chronic osteomyelitis.[name] He dresses this with silver alginate. We order the supplies. I continue to see him on a monthly basis for wound debridement. 06/24/15 the patient arrives here today for his monthly wound care check. He has underlying chronic osteomyelitis. The wound was surgically debridement of nonviable circumferential tissue. He is continued with silver alginate based stressing severe this area changed daily by his wife. Post debridement this actually looks as good as I've seen this since quite some time 07/29/15. The patient has been coming here monthly for many years. He has a chronic wound on his right lateral foot at roughly the mid shaft of his fifth metatarsal. Initially he underwent a code course of IV antibiotics hyperbaric oxygen, resection I believe of his fifth and fourth metatarsal for chronic osteomyelitis. 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 He was offered an amputation at some point but refused. He comes here for "palliative" wound care which requires extensive debridement of necrotic subcutaneous tissue to clean up the wound bed. He has been using silver alginate based dressings at home for many years. Although he has extensive edema of his foot, the situation has overall remained stable perhaps surprisingly so. 09/16/15; the patient has been coming here for many years. He is a chronic wound in his right lateral foot  roughly the mid shaft of the fifth metatarsal. He is had previous surgery on this with resection. He clearly has underlying chronic osteomyelitis. An amputation was recommended years ago which she refused MRI to list the he has done exceptionally well with "palliative" wound dressings. He comes in to see me about every 4-6 weeks for debridement of nonviable tissue surface eschar and he has been using silver alginate based dressings. 10/14/15; the patient is here for his maintenance surgical debridement of the non-healing wound on the lateral aspect of his right foot. He has underlying osteomyelitis but is miraculously managed to hold on to this foot in spite of all odds. He completed aggressive management including surgery,  KHYSON, FRANCAVILLA ([id number]) Visit Report for 02/09/2020 HPI Details Patient Name: Date of Service: Nathan Miller, Nathan Miller 02/09/2020 9:15 AM Medical Record [id number] Patient Account Number: [id number] Date of Birth/Sex: Treating RN: 11-Jul-1951 (69 y.o. [name] Primary Care Provider: PATIENT, NO Other Clinician: Referring Provider: Treating Provider/Extender:Kenn Rekowski, Otelia Sergeant, Cherre Blanc in Treatment: 6 History of Present Illness Location: right foot Severity: Wag 3 Duration: #months Modifying Factors: Diabetic and charcot' foot.. Peripheral vascular disease Associated Signs and Symptoms: osteo HPI Description: see chief complaint. 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 This is been present for 5-6 years. He underwent a course of IV any biotics and hyperbaric oxygen roughly 5 years ago. He was offered an amputation by orthopedics and I believe at the time I concurred with this however the patient wishes to up go via wait-and-see approach. He is actually done fairly well over the 5 years is still ambulatory. He uses a silver alginate-based dressing on the foot which he changes himself up. 05/27/15; patient returns for his monthly visit the. The status of his foot and his wound are essentially unchanged this patient has a chronic idiopathic neuropathy. He has underlying osteomyelitis. He had extensive orthopedic surgery roughly 3 or 4 years ago had. He has underlying chronic osteomyelitis.[name] He dresses this with silver alginate. We order the supplies. I continue to see him on a monthly basis for wound debridement. 06/24/15 the patient arrives here today for his monthly wound care check. He has underlying chronic osteomyelitis. The wound was surgically debridement of nonviable circumferential tissue. He is continued with silver alginate based stressing severe this area  changed daily by his wife. Post debridement this actually looks as good as I've seen this since quite some time 07/29/15. The patient has been coming here monthly for many years. He has a chronic wound on his right lateral foot at roughly the mid shaft of his fifth metatarsal. Initially he underwent a code course of IV antibiotics hyperbaric oxygen, resection I believe of his fifth and fourth metatarsal for chronic osteomyelitis. 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 He was offered an amputation at some point but refused. He comes here for "palliative" wound care which requires extensive debridement of necrotic subcutaneous tissue to clean up the wound bed. He has been using silver alginate based dressings at home for many years. Although he has extensive edema of his foot, the situation has overall remained stable perhaps surprisingly so. 09/16/15; the patient has been coming here for many years. He is a chronic wound in his right lateral foot roughly the mid shaft of the fifth metatarsal. He is had previous surgery on this with resection. He clearly has underlying chronic osteomyelitis. An amputation was recommended years ago which she refused MRI to list the he has done exceptionally well with "palliative" wound dressings. He comes in to see me about every 4-6 weeks for debridement of nonviable tissue surface eschar and he has been using silver alginate based dressings. 10/14/15; the patient is here for his maintenance surgical debridement of the non-healing wound on the lateral aspect of his right foot. He has underlying osteomyelitis but is miraculously managed to hold on to this foot in spite of all odds. He completed aggressive management including surgery, hyperbarics perhaps 4 years ago. Unfortunately this never closed. There continues to be swelling of the foot however this has been stable for a long time. 11/25/15;  KHYSON, FRANCAVILLA ([id number]) Visit Report for 02/09/2020 HPI Details Patient Name: Date of Service: Nathan Miller, Nathan Miller 02/09/2020 9:15 AM Medical Record [id number] Patient Account Number: [id number] Date of Birth/Sex: Treating RN: 11-Jul-1951 (69 y.o. [name] Primary Care Provider: PATIENT, NO Other Clinician: Referring Provider: Treating Provider/Extender:Kenn Rekowski, Otelia Sergeant, Cherre Blanc in Treatment: 6 History of Present Illness Location: right foot Severity: Wag 3 Duration: #months Modifying Factors: Diabetic and charcot' foot.. Peripheral vascular disease Associated Signs and Symptoms: osteo HPI Description: see chief complaint. 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 This is been present for 5-6 years. He underwent a course of IV any biotics and hyperbaric oxygen roughly 5 years ago. He was offered an amputation by orthopedics and I believe at the time I concurred with this however the patient wishes to up go via wait-and-see approach. He is actually done fairly well over the 5 years is still ambulatory. He uses a silver alginate-based dressing on the foot which he changes himself up. 05/27/15; patient returns for his monthly visit the. The status of his foot and his wound are essentially unchanged this patient has a chronic idiopathic neuropathy. He has underlying osteomyelitis. He had extensive orthopedic surgery roughly 3 or 4 years ago had. He has underlying chronic osteomyelitis.[name] He dresses this with silver alginate. We order the supplies. I continue to see him on a monthly basis for wound debridement. 06/24/15 the patient arrives here today for his monthly wound care check. He has underlying chronic osteomyelitis. The wound was surgically debridement of nonviable circumferential tissue. He is continued with silver alginate based stressing severe this area  changed daily by his wife. Post debridement this actually looks as good as I've seen this since quite some time 07/29/15. The patient has been coming here monthly for many years. He has a chronic wound on his right lateral foot at roughly the mid shaft of his fifth metatarsal. Initially he underwent a code course of IV antibiotics hyperbaric oxygen, resection I believe of his fifth and fourth metatarsal for chronic osteomyelitis. 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 He was offered an amputation at some point but refused. He comes here for "palliative" wound care which requires extensive debridement of necrotic subcutaneous tissue to clean up the wound bed. He has been using silver alginate based dressings at home for many years. Although he has extensive edema of his foot, the situation has overall remained stable perhaps surprisingly so. 09/16/15; the patient has been coming here for many years. He is a chronic wound in his right lateral foot roughly the mid shaft of the fifth metatarsal. He is had previous surgery on this with resection. He clearly has underlying chronic osteomyelitis. An amputation was recommended years ago which she refused MRI to list the he has done exceptionally well with "palliative" wound dressings. He comes in to see me about every 4-6 weeks for debridement of nonviable tissue surface eschar and he has been using silver alginate based dressings. 10/14/15; the patient is here for his maintenance surgical debridement of the non-healing wound on the lateral aspect of his right foot. He has underlying osteomyelitis but is miraculously managed to hold on to this foot in spite of all odds. He completed aggressive management including surgery, hyperbarics perhaps 4 years ago. Unfortunately this never closed. There continues to be swelling of the foot however this has been stable for a long time. 11/25/15;  coronary artery disease, pacemaker, stent, type 2 diabetes with a recent hemoglobin A1c of 6.1. ABI in our clinic was 1.0 on the right 3/22; areas on the right lateral heel not any better than last time. He has some undermining medially. This is undoubtedly a weightbearing surface here. This will not be easy to heal. 3/29; this is a patient with type 2 diabetes and her Charcot foot. He has an area on the plantar heel on the right. We have been using silver alginate. He is his own special boot with a brace. 4/type 2 diabetes with a Charcot foot. Areas on the plantar heel on the right. Improvement in depth. We have been using silver alginate Objective Constitutional Patient is hypertensive.. Pulse regular and within target range for patient.[name] Respirations regular, non-labored and within target range.. Temperature is normal and within the target range for the patient.[name]  Appears in no distress. Vitals Time Taken: 9:05 AM, Height: 68 in, Weight: 253 lbs, BMI: 38.5, Temperature: 97.5 F, Pulse: 82 bpm, Respiratory Rate: 20 breaths/min, Blood Pressure: 169/89 mmHg. Respiratory work of breathing is normal. Cardiovascular Pedal pulses are palpable. General Notes: Wound exam; the area in question is on the right heel. Much less undermining. This is quite a bit better. Not nearly as much depth. The tissue at the base of the wound looks healthy. There is no surrounding erythema Integumentary (Hair, Skin) No erythema or tenderness around the wound no drainage.. Wound #3 status is Open. Original cause of wound was Blister. The wound is located on the Right,Plantar Calcaneus. The wound measures 1.7cm length x 0.9cm width x 0.2cm depth; 1.202cm^2 area and 0.24cm^3 volume. There is Fat Layer (Subcutaneous Tissue) Exposed exposed. There is no tunneling noted, however, there is undermining starting at 11:00 and ending at 3:00 with a maximum distance of 0.6cm. There is a medium amount of serosanguineous drainage noted. The wound margin is thickened. There is large (67-100%) red, pink granulation within the wound bed. There is no necrotic tissue within the wound bed. 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 Dressing Change Frequency: Wound #3 Right,Plantar Calcaneus: Change dressing every day. Wound Cleansing: Wound #3 Right,Plantar Calcaneus: May shower and wash wound with soap and water. - on days that dressing is changed Primary Wound Dressing: Wound #3 Right,Plantar Calcaneus: Calcium Alginate with Silver Secondary Dressing: Wound #3 Right,Plantar Calcaneus: Foam - foam donut Kerlix/Rolled Gauze  - secure with tape Dry Gauze Off-Loading: Turn and reposition every 2 hours Other: - float heels off of bed/chair with pillow under calves 1. Continue with silver alginate we seem to be making decent progress 2. Follow-up in 2 weeks Electronic Signature(s) Signed: 02/10/2020 5:51:00 PM By: Linton Ham MD Entered By: Linton Ham on 02/09/2020 09:48:36 -------------------------------------------------------------------------------- SuperBill Details Patient Name: Date of Service: [name] 02/09/2020 Medical Record [id number] Patient Account Number: [id number] Date of Birth/Sex: Treating RN: Apr 08, 1951 (68 y.o. [name] Primary Care Provider: PATIENT, NO Other Clinician: Referring Provider: Treating Provider/Extender:Arva Slaugh, Otelia Sergeant, Cherre Blanc in Treatment: 6 Diagnosis Coding ICD-10 Codes Code Description E11.621 Type 2 diabetes mellitus with foot ulcer 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 of other part of right foot with fat layer exposed E11.42 Type 2 diabetes mellitus with diabetic polyneuropathy M14.671 Charcot's joint, right ankle and foot Facility Procedures CPT4  coronary artery disease, pacemaker, stent, type 2 diabetes with a recent hemoglobin A1c of 6.1. ABI in our clinic was 1.0 on the right 3/22; areas on the right lateral heel not any better than last time. He has some undermining medially. This is undoubtedly a weightbearing surface here. This will not be easy to heal. 3/29; this is a patient with type 2 diabetes and her Charcot foot. He has an area on the plantar heel on the right. We have been using silver alginate. He is his own special boot with a brace. 4/type 2 diabetes with a Charcot foot. Areas on the plantar heel on the right. Improvement in depth. We have been using silver alginate Objective Constitutional Patient is hypertensive.. Pulse regular and within target range for patient.[name] Respirations regular, non-labored and within target range.. Temperature is normal and within the target range for the patient.[name]  Appears in no distress. Vitals Time Taken: 9:05 AM, Height: 68 in, Weight: 253 lbs, BMI: 38.5, Temperature: 97.5 F, Pulse: 82 bpm, Respiratory Rate: 20 breaths/min, Blood Pressure: 169/89 mmHg. Respiratory work of breathing is normal. Cardiovascular Pedal pulses are palpable. General Notes: Wound exam; the area in question is on the right heel. Much less undermining. This is quite a bit better. Not nearly as much depth. The tissue at the base of the wound looks healthy. There is no surrounding erythema Integumentary (Hair, Skin) No erythema or tenderness around the wound no drainage.. Wound #3 status is Open. Original cause of wound was Blister. The wound is located on the Right,Plantar Calcaneus. The wound measures 1.7cm length x 0.9cm width x 0.2cm depth; 1.202cm^2 area and 0.24cm^3 volume. There is Fat Layer (Subcutaneous Tissue) Exposed exposed. There is no tunneling noted, however, there is undermining starting at 11:00 and ending at 3:00 with a maximum distance of 0.6cm. There is a medium amount of serosanguineous drainage noted. The wound margin is thickened. There is large (67-100%) red, pink granulation within the wound bed. There is no necrotic tissue within the wound bed. 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 Dressing Change Frequency: Wound #3 Right,Plantar Calcaneus: Change dressing every day. Wound Cleansing: Wound #3 Right,Plantar Calcaneus: May shower and wash wound with soap and water. - on days that dressing is changed Primary Wound Dressing: Wound #3 Right,Plantar Calcaneus: Calcium Alginate with Silver Secondary Dressing: Wound #3 Right,Plantar Calcaneus: Foam - foam donut Kerlix/Rolled Gauze  - secure with tape Dry Gauze Off-Loading: Turn and reposition every 2 hours Other: - float heels off of bed/chair with pillow under calves 1. Continue with silver alginate we seem to be making decent progress 2. Follow-up in 2 weeks Electronic Signature(s) Signed: 02/10/2020 5:51:00 PM By: Linton Ham MD Entered By: Linton Ham on 02/09/2020 09:48:36 -------------------------------------------------------------------------------- SuperBill Details Patient Name: Date of Service: [name] 02/09/2020 Medical Record [id number] Patient Account Number: [id number] Date of Birth/Sex: Treating RN: Apr 08, 1951 (68 y.o. [name] Primary Care Provider: PATIENT, NO Other Clinician: Referring Provider: Treating Provider/Extender:Arva Slaugh, Otelia Sergeant, Cherre Blanc in Treatment: 6 Diagnosis Coding ICD-10 Codes Code Description E11.621 Type 2 diabetes mellitus with foot ulcer 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 of other part of right foot with fat layer exposed E11.42 Type 2 diabetes mellitus with diabetic polyneuropathy M14.671 Charcot's joint, right ankle and foot Facility Procedures CPT4  Yes Inactive Problems Resolved Problems Electronic Signature(s) Signed: 02/10/2020 5:51:00 PM By: Linton Ham MD Entered By: Linton Ham on 02/09/2020 09:45:25 -------------------------------------------------------------------------------- Progress Note Details Patient Name: Date of Service: Nathan Eastern D. 02/09/2020 9:15 AM Medical Record [id number] Patient Account Number: [id number] Date of Birth/Sex: Treating RN: 1951/03/24 (69 y.o. [name] Primary Care Provider: Other Clinician: PATIENT, NO Referring Provider: Treating Provider/Extender:Marialy Urbanczyk, Otelia Sergeant, Cherre Blanc in Treatment: 6 Subjective History of Present Illness (HPI) The following HPI elements were documented for the patient's wound: Location: right foot Severity: Wag 3 Duration: #months Modifying Factors: Diabetic and charcot' foot.. Peripheral vascular disease Associated Signs and Symptoms: osteo see chief complaint. 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 This is been present for 5-6 years. He underwent a course of IV any biotics and hyperbaric oxygen roughly 5 years ago. He was offered an amputation by orthopedics  and I believe at the time I concurred with this however the patient wishes to up go via wait-and-see approach. He is actually done fairly well over the 5 years is still ambulatory. He uses a silver alginate-based dressing on the foot which he changes himself up. 05/27/15; patient returns for his monthly visit the. The status of his foot and his wound are essentially unchanged this patient has a chronic idiopathic neuropathy. He has underlying osteomyelitis. He had extensive orthopedic surgery roughly 3 or 4 years ago had. He has underlying chronic osteomyelitis.[name] He dresses this with silver alginate. We order the supplies. I continue to see him on a monthly basis for wound debridement. 06/24/15 the patient arrives here today for his monthly wound care check. He has underlying chronic osteomyelitis. The wound was surgically debridement of nonviable circumferential tissue. He is continued with silver alginate based stressing severe this area changed daily by his wife. Post debridement this actually looks as good as I've seen this since quite some time 07/29/15. The patient has been coming here monthly for many years. He has a chronic wound on his right lateral foot at roughly the mid shaft of his fifth metatarsal. Initially he underwent a code course of IV antibiotics hyperbaric oxygen, resection I believe of his fifth and fourth metatarsal for chronic osteomyelitis. 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 He was offered an amputation at some point but refused. He comes here for "palliative" wound care which requires extensive debridement of necrotic subcutaneous tissue to clean up the wound bed. He has been using silver alginate based dressings at home for many years. Although he has extensive edema of his foot, the situation has overall remained stable perhaps surprisingly so. 09/16/15; the patient has been coming here for many years. He is a chronic wound in his right lateral foot  roughly the mid shaft of the fifth metatarsal. He is had previous surgery on this with resection. He clearly has underlying chronic osteomyelitis. An amputation was recommended years ago which she refused MRI to list the he has done exceptionally well with "palliative" wound dressings. He comes in to see me about every 4-6 weeks for debridement of nonviable tissue surface eschar and he has been using silver alginate based dressings. 10/14/15; the patient is here for his maintenance surgical debridement of the non-healing wound on the lateral aspect of his right foot. He has underlying osteomyelitis but is miraculously managed to hold on to this foot in spite of all odds. He completed aggressive management including surgery,  Yes Inactive Problems Resolved Problems Electronic Signature(s) Signed: 02/10/2020 5:51:00 PM By: Linton Ham MD Entered By: Linton Ham on 02/09/2020 09:45:25 -------------------------------------------------------------------------------- Progress Note Details Patient Name: Date of Service: Nathan Eastern D. 02/09/2020 9:15 AM Medical Record [id number] Patient Account Number: [id number] Date of Birth/Sex: Treating RN: 1951/03/24 (69 y.o. [name] Primary Care Provider: Other Clinician: PATIENT, NO Referring Provider: Treating Provider/Extender:Marialy Urbanczyk, Otelia Sergeant, Cherre Blanc in Treatment: 6 Subjective History of Present Illness (HPI) The following HPI elements were documented for the patient's wound: Location: right foot Severity: Wag 3 Duration: #months Modifying Factors: Diabetic and charcot' foot.. Peripheral vascular disease Associated Signs and Symptoms: osteo see chief complaint. 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 This is been present for 5-6 years. He underwent a course of IV any biotics and hyperbaric oxygen roughly 5 years ago. He was offered an amputation by orthopedics  and I believe at the time I concurred with this however the patient wishes to up go via wait-and-see approach. He is actually done fairly well over the 5 years is still ambulatory. He uses a silver alginate-based dressing on the foot which he changes himself up. 05/27/15; patient returns for his monthly visit the. The status of his foot and his wound are essentially unchanged this patient has a chronic idiopathic neuropathy. He has underlying osteomyelitis. He had extensive orthopedic surgery roughly 3 or 4 years ago had. He has underlying chronic osteomyelitis.[name] He dresses this with silver alginate. We order the supplies. I continue to see him on a monthly basis for wound debridement. 06/24/15 the patient arrives here today for his monthly wound care check. He has underlying chronic osteomyelitis. The wound was surgically debridement of nonviable circumferential tissue. He is continued with silver alginate based stressing severe this area changed daily by his wife. Post debridement this actually looks as good as I've seen this since quite some time 07/29/15. The patient has been coming here monthly for many years. He has a chronic wound on his right lateral foot at roughly the mid shaft of his fifth metatarsal. Initially he underwent a code course of IV antibiotics hyperbaric oxygen, resection I believe of his fifth and fourth metatarsal for chronic osteomyelitis. 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 He was offered an amputation at some point but refused. He comes here for "palliative" wound care which requires extensive debridement of necrotic subcutaneous tissue to clean up the wound bed. He has been using silver alginate based dressings at home for many years. Although he has extensive edema of his foot, the situation has overall remained stable perhaps surprisingly so. 09/16/15; the patient has been coming here for many years. He is a chronic wound in his right lateral foot  roughly the mid shaft of the fifth metatarsal. He is had previous surgery on this with resection. He clearly has underlying chronic osteomyelitis. An amputation was recommended years ago which she refused MRI to list the he has done exceptionally well with "palliative" wound dressings. He comes in to see me about every 4-6 weeks for debridement of nonviable tissue surface eschar and he has been using silver alginate based dressings. 10/14/15; the patient is here for his maintenance surgical debridement of the non-healing wound on the lateral aspect of his right foot. He has underlying osteomyelitis but is miraculously managed to hold on to this foot in spite of all odds. He completed aggressive management including surgery,  Yes Inactive Problems Resolved Problems Electronic Signature(s) Signed: 02/10/2020 5:51:00 PM By: Linton Ham MD Entered By: Linton Ham on 02/09/2020 09:45:25 -------------------------------------------------------------------------------- Progress Note Details Patient Name: Date of Service: Nathan Eastern D. 02/09/2020 9:15 AM Medical Record [id number] Patient Account Number: [id number] Date of Birth/Sex: Treating RN: 1951/03/24 (69 y.o. [name] Primary Care Provider: Other Clinician: PATIENT, NO Referring Provider: Treating Provider/Extender:Marialy Urbanczyk, Otelia Sergeant, Cherre Blanc in Treatment: 6 Subjective History of Present Illness (HPI) The following HPI elements were documented for the patient's wound: Location: right foot Severity: Wag 3 Duration: #months Modifying Factors: Diabetic and charcot' foot.. Peripheral vascular disease Associated Signs and Symptoms: osteo see chief complaint. 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 This is been present for 5-6 years. He underwent a course of IV any biotics and hyperbaric oxygen roughly 5 years ago. He was offered an amputation by orthopedics  and I believe at the time I concurred with this however the patient wishes to up go via wait-and-see approach. He is actually done fairly well over the 5 years is still ambulatory. He uses a silver alginate-based dressing on the foot which he changes himself up. 05/27/15; patient returns for his monthly visit the. The status of his foot and his wound are essentially unchanged this patient has a chronic idiopathic neuropathy. He has underlying osteomyelitis. He had extensive orthopedic surgery roughly 3 or 4 years ago had. He has underlying chronic osteomyelitis.[name] He dresses this with silver alginate. We order the supplies. I continue to see him on a monthly basis for wound debridement. 06/24/15 the patient arrives here today for his monthly wound care check. He has underlying chronic osteomyelitis. The wound was surgically debridement of nonviable circumferential tissue. He is continued with silver alginate based stressing severe this area changed daily by his wife. Post debridement this actually looks as good as I've seen this since quite some time 07/29/15. The patient has been coming here monthly for many years. He has a chronic wound on his right lateral foot at roughly the mid shaft of his fifth metatarsal. Initially he underwent a code course of IV antibiotics hyperbaric oxygen, resection I believe of his fifth and fourth metatarsal for chronic osteomyelitis. 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 He was offered an amputation at some point but refused. He comes here for "palliative" wound care which requires extensive debridement of necrotic subcutaneous tissue to clean up the wound bed. He has been using silver alginate based dressings at home for many years. Although he has extensive edema of his foot, the situation has overall remained stable perhaps surprisingly so. 09/16/15; the patient has been coming here for many years. He is a chronic wound in his right lateral foot  roughly the mid shaft of the fifth metatarsal. He is had previous surgery on this with resection. He clearly has underlying chronic osteomyelitis. An amputation was recommended years ago which she refused MRI to list the he has done exceptionally well with "palliative" wound dressings. He comes in to see me about every 4-6 weeks for debridement of nonviable tissue surface eschar and he has been using silver alginate based dressings. 10/14/15; the patient is here for his maintenance surgical debridement of the non-healing wound on the lateral aspect of his right foot. He has underlying osteomyelitis but is miraculously managed to hold on to this foot in spite of all odds. He completed aggressive management including surgery,  coronary artery disease, pacemaker, stent, type 2 diabetes with a recent hemoglobin A1c of 6.1. ABI in our clinic was 1.0 on the right 3/22; areas on the right lateral heel not any better than last time. He has some undermining medially. This is undoubtedly a weightbearing surface here. This will not be easy to heal. 3/29; this is a patient with type 2 diabetes and her Charcot foot. He has an area on the plantar heel on the right. We have been using silver alginate. He is his own special boot with a brace. 4/type 2 diabetes with a Charcot foot. Areas on the plantar heel on the right. Improvement in depth. We have been using silver alginate Objective Constitutional Patient is hypertensive.. Pulse regular and within target range for patient.[name] Respirations regular, non-labored and within target range.. Temperature is normal and within the target range for the patient.[name]  Appears in no distress. Vitals Time Taken: 9:05 AM, Height: 68 in, Weight: 253 lbs, BMI: 38.5, Temperature: 97.5 F, Pulse: 82 bpm, Respiratory Rate: 20 breaths/min, Blood Pressure: 169/89 mmHg. Respiratory work of breathing is normal. Cardiovascular Pedal pulses are palpable. General Notes: Wound exam; the area in question is on the right heel. Much less undermining. This is quite a bit better. Not nearly as much depth. The tissue at the base of the wound looks healthy. There is no surrounding erythema Integumentary (Hair, Skin) No erythema or tenderness around the wound no drainage.. Wound #3 status is Open. Original cause of wound was Blister. The wound is located on the Right,Plantar Calcaneus. The wound measures 1.7cm length x 0.9cm width x 0.2cm depth; 1.202cm^2 area and 0.24cm^3 volume. There is Fat Layer (Subcutaneous Tissue) Exposed exposed. There is no tunneling noted, however, there is undermining starting at 11:00 and ending at 3:00 with a maximum distance of 0.6cm. There is a medium amount of serosanguineous drainage noted. The wound margin is thickened. There is large (67-100%) red, pink granulation within the wound bed. There is no necrotic tissue within the wound bed. 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 Dressing Change Frequency: Wound #3 Right,Plantar Calcaneus: Change dressing every day. Wound Cleansing: Wound #3 Right,Plantar Calcaneus: May shower and wash wound with soap and water. - on days that dressing is changed Primary Wound Dressing: Wound #3 Right,Plantar Calcaneus: Calcium Alginate with Silver Secondary Dressing: Wound #3 Right,Plantar Calcaneus: Foam - foam donut Kerlix/Rolled Gauze  - secure with tape Dry Gauze Off-Loading: Turn and reposition every 2 hours Other: - float heels off of bed/chair with pillow under calves 1. Continue with silver alginate we seem to be making decent progress 2. Follow-up in 2 weeks Electronic Signature(s) Signed: 02/10/2020 5:51:00 PM By: Linton Ham MD Entered By: Linton Ham on 02/09/2020 09:48:36 -------------------------------------------------------------------------------- SuperBill Details Patient Name: Date of Service: [name] 02/09/2020 Medical Record [id number] Patient Account Number: [id number] Date of Birth/Sex: Treating RN: Apr 08, 1951 (68 y.o. [name] Primary Care Provider: PATIENT, NO Other Clinician: Referring Provider: Treating Provider/Extender:Arva Slaugh, Otelia Sergeant, Cherre Blanc in Treatment: 6 Diagnosis Coding ICD-10 Codes Code Description E11.621 Type 2 diabetes mellitus with foot ulcer 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 of other part of right foot with fat layer exposed E11.42 Type 2 diabetes mellitus with diabetic polyneuropathy M14.671 Charcot's joint, right ankle and foot Facility Procedures CPT4  coronary artery disease, pacemaker, stent, type 2 diabetes with a recent hemoglobin A1c of 6.1. ABI in our clinic was 1.0 on the right 3/22; areas on the right lateral heel not any better than last time. He has some undermining medially. This is undoubtedly a weightbearing surface here. This will not be easy to heal. 3/29; this is a patient with type 2 diabetes and her Charcot foot. He has an area on the plantar heel on the right. We have been using silver alginate. He is his own special boot with a brace. 4/type 2 diabetes with a Charcot foot. Areas on the plantar heel on the right. Improvement in depth. We have been using silver alginate Objective Constitutional Patient is hypertensive.. Pulse regular and within target range for patient.[name] Respirations regular, non-labored and within target range.. Temperature is normal and within the target range for the patient.[name]  Appears in no distress. Vitals Time Taken: 9:05 AM, Height: 68 in, Weight: 253 lbs, BMI: 38.5, Temperature: 97.5 F, Pulse: 82 bpm, Respiratory Rate: 20 breaths/min, Blood Pressure: 169/89 mmHg. Respiratory work of breathing is normal. Cardiovascular Pedal pulses are palpable. General Notes: Wound exam; the area in question is on the right heel. Much less undermining. This is quite a bit better. Not nearly as much depth. The tissue at the base of the wound looks healthy. There is no surrounding erythema Integumentary (Hair, Skin) No erythema or tenderness around the wound no drainage.. Wound #3 status is Open. Original cause of wound was Blister. The wound is located on the Right,Plantar Calcaneus. The wound measures 1.7cm length x 0.9cm width x 0.2cm depth; 1.202cm^2 area and 0.24cm^3 volume. There is Fat Layer (Subcutaneous Tissue) Exposed exposed. There is no tunneling noted, however, there is undermining starting at 11:00 and ending at 3:00 with a maximum distance of 0.6cm. There is a medium amount of serosanguineous drainage noted. The wound margin is thickened. There is large (67-100%) red, pink granulation within the wound bed. There is no necrotic tissue within the wound bed. 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 Dressing Change Frequency: Wound #3 Right,Plantar Calcaneus: Change dressing every day. Wound Cleansing: Wound #3 Right,Plantar Calcaneus: May shower and wash wound with soap and water. - on days that dressing is changed Primary Wound Dressing: Wound #3 Right,Plantar Calcaneus: Calcium Alginate with Silver Secondary Dressing: Wound #3 Right,Plantar Calcaneus: Foam - foam donut Kerlix/Rolled Gauze  - secure with tape Dry Gauze Off-Loading: Turn and reposition every 2 hours Other: - float heels off of bed/chair with pillow under calves 1. Continue with silver alginate we seem to be making decent progress 2. Follow-up in 2 weeks Electronic Signature(s) Signed: 02/10/2020 5:51:00 PM By: Linton Ham MD Entered By: Linton Ham on 02/09/2020 09:48:36 -------------------------------------------------------------------------------- SuperBill Details Patient Name: Date of Service: [name] 02/09/2020 Medical Record [id number] Patient Account Number: [id number] Date of Birth/Sex: Treating RN: Apr 08, 1951 (68 y.o. [name] Primary Care Provider: PATIENT, NO Other Clinician: Referring Provider: Treating Provider/Extender:Arva Slaugh, Otelia Sergeant, Cherre Blanc in Treatment: 6 Diagnosis Coding ICD-10 Codes Code Description E11.621 Type 2 diabetes mellitus with foot ulcer 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 of other part of right foot with fat layer exposed E11.42 Type 2 diabetes mellitus with diabetic polyneuropathy M14.671 Charcot's joint, right ankle and foot Facility Procedures CPT4  on the right lateral heel not any better than last time. He has some undermining medially. This is undoubtedly a weightbearing surface here. This will not be easy to heal. 3/29; this is a patient with type 2 diabetes and her Charcot foot. He has an area on the plantar heel on the right. We have been using silver alginate. He is his own special boot with a brace. 4/type 2 diabetes with a Charcot foot. Areas on the plantar heel on the right. Improvement in depth. We have been using silver alginate Electronic Signature(s) Signed: 02/10/2020 5:51:00 PM By: Linton Ham MD Entered By: Linton Ham on 02/09/2020 09:46:46 -------------------------------------------------------------------------------- Physical Exam Details Patient Name: Date of Service: EASTYN, EVANOFF 02/09/2020 9:15 AM Medical Record [id number] Patient Account Number: [id number] Date of Birth/Sex: Treating RN: 1950-12-06 (69 y.o. [name] Primary Care Provider: PATIENT, NO Other Clinician: Referring Provider: Treating Provider/Extender:Nautica Hotz, Otelia Sergeant, Cherre Blanc in Treatment: 6 Constitutional Patient is hypertensive.. Pulse regular and within target range for patient.[name] Respirations regular, non-labored and within target range.. Temperature is normal and within the target range for the patient.[name] Appears in no distress. Respiratory work of breathing is normal. Cardiovascular Pedal pulses are palpable. Integumentary (Hair, Skin) No erythema or tenderness around the wound no drainage.. Notes Wound exam; the area in question is on the right heel. Much less undermining. This is quite a bit better. Not nearly as much depth. The tissue at the base of the  wound looks healthy. There is no surrounding erythema Electronic Signature(s) Signed: 02/10/2020 5:51:00 PM By: Linton Ham MD Entered By: Linton Ham on 02/09/2020 09:47:59 -------------------------------------------------------------------------------- Physician Orders Details Patient Name: Date of Service: BENIAMIN, CALLISTE 02/09/2020 9:15 AM Medical Record [id number] Patient Account Number: [id number] Date of Birth/Sex: Treating RN: [birth year] (68 y.o. [name] Primary Care Provider: PATIENT, NO Other Clinician: Referring Provider: Treating Provider/Extender:Ardeth Repetto, Otelia Sergeant, Cherre Blanc in Treatment: 6 Verbal / Phone Orders: No Diagnosis Coding ICD-10 Coding Code Description E11.621 Type 2 diabetes mellitus with foot ulcer 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 of other part of right foot with fat layer exposed E11.42 Type 2 diabetes mellitus with diabetic polyneuropathy M14.671 Charcot's joint, right ankle and foot Follow-up Appointments Return Appointment in 2 weeks. Dressing Change Frequency Wound #3 Right,Plantar Calcaneus Change dressing every day. Wound Cleansing Wound #3 Right,Plantar Calcaneus May shower and wash wound with soap and water. - on days that dressing is changed Primary Wound Dressing Wound #3 Right,Plantar Calcaneus Calcium Alginate with Silver Secondary Dressing Wound #3 Right,Plantar Calcaneus Foam - foam donut Kerlix/Rolled Gauze - secure with tape Dry Gauze Off-Loading Turn and reposition every 2 hours Other: - float heels off of bed/chair with pillow under calves Electronic Signature(s) Signed: 02/09/2020 6:12:10 PM By: Levan Hurst RN, BSN Signed: 02/10/2020 5:51:00 PM By: Linton Ham MD Entered By: Levan Hurst on 02/09/2020 09:10:52 -------------------------------------------------------------------------------- Problem List Details Patient Name: Date of Service: Nathan Eastern D. 02/09/2020 9:15 AM Medical  Record [id number] Patient Account Number: [id number] Date of Birth/Sex: Treating RN: [birth year] (69 y.o. [name] Primary Care Provider: PATIENT, NO Other Clinician: Referring Provider: Treating Provider/Extender:Gwendalynn Eckstrom, Otelia Sergeant, Cherre Blanc in Treatment: 6 Active Problems ICD-10 Evaluated Encounter Code Description Active Date Today Diagnosis E11.621 Type 2 diabetes mellitus with foot ulcer 12/29/2019 No Yes 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 of other part of right foot 12/29/2019 No Yes with fat layer exposed E11.42 Type 2 diabetes mellitus with diabetic polyneuropathy 12/29/2019 No Yes M14.671 Charcot's joint, right ankle and foot 12/29/2019 No  Yes Inactive Problems Resolved Problems Electronic Signature(s) Signed: 02/10/2020 5:51:00 PM By: Linton Ham MD Entered By: Linton Ham on 02/09/2020 09:45:25 -------------------------------------------------------------------------------- Progress Note Details Patient Name: Date of Service: Nathan Eastern D. 02/09/2020 9:15 AM Medical Record [id number] Patient Account Number: [id number] Date of Birth/Sex: Treating RN: 1951/03/24 (69 y.o. [name] Primary Care Provider: Other Clinician: PATIENT, NO Referring Provider: Treating Provider/Extender:Marialy Urbanczyk, Otelia Sergeant, Cherre Blanc in Treatment: 6 Subjective History of Present Illness (HPI) The following HPI elements were documented for the patient's wound: Location: right foot Severity: Wag 3 Duration: #months Modifying Factors: Diabetic and charcot' foot.. Peripheral vascular disease Associated Signs and Symptoms: osteo see chief complaint. 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 This is been present for 5-6 years. He underwent a course of IV any biotics and hyperbaric oxygen roughly 5 years ago. He was offered an amputation by orthopedics  and I believe at the time I concurred with this however the patient wishes to up go via wait-and-see approach. He is actually done fairly well over the 5 years is still ambulatory. He uses a silver alginate-based dressing on the foot which he changes himself up. 05/27/15; patient returns for his monthly visit the. The status of his foot and his wound are essentially unchanged this patient has a chronic idiopathic neuropathy. He has underlying osteomyelitis. He had extensive orthopedic surgery roughly 3 or 4 years ago had. He has underlying chronic osteomyelitis.[name] He dresses this with silver alginate. We order the supplies. I continue to see him on a monthly basis for wound debridement. 06/24/15 the patient arrives here today for his monthly wound care check. He has underlying chronic osteomyelitis. The wound was surgically debridement of nonviable circumferential tissue. He is continued with silver alginate based stressing severe this area changed daily by his wife. Post debridement this actually looks as good as I've seen this since quite some time 07/29/15. The patient has been coming here monthly for many years. He has a chronic wound on his right lateral foot at roughly the mid shaft of his fifth metatarsal. Initially he underwent a code course of IV antibiotics hyperbaric oxygen, resection I believe of his fifth and fourth metatarsal for chronic osteomyelitis. 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 He was offered an amputation at some point but refused. He comes here for "palliative" wound care which requires extensive debridement of necrotic subcutaneous tissue to clean up the wound bed. He has been using silver alginate based dressings at home for many years. Although he has extensive edema of his foot, the situation has overall remained stable perhaps surprisingly so. 09/16/15; the patient has been coming here for many years. He is a chronic wound in his right lateral foot  roughly the mid shaft of the fifth metatarsal. He is had previous surgery on this with resection. He clearly has underlying chronic osteomyelitis. An amputation was recommended years ago which she refused MRI to list the he has done exceptionally well with "palliative" wound dressings. He comes in to see me about every 4-6 weeks for debridement of nonviable tissue surface eschar and he has been using silver alginate based dressings. 10/14/15; the patient is here for his maintenance surgical debridement of the non-healing wound on the lateral aspect of his right foot. He has underlying osteomyelitis but is miraculously managed to hold on to this foot in spite of all odds. He completed aggressive management including surgery,

## 2020-02-10 NOTE — Progress Notes (Signed)
any number of wounds) 1 5 '[]'$  - Wound Tracing (instead of photographs) 0 X - Simple Wound Measurement - one wound 1 5 '[]'$  - Complex Wound Measurement - multiple wounds 0 INTERVENTIONS - Wound Dressings '[]'$  - Small Wound Dressing one or multiple wounds 0 X - Medium Wound Dressing one or multiple wounds 1 15 '[]'$  - Large Wound Dressing one or multiple wounds 0 X - Application of Medications - topical 1 5 '[]'$  - Application of Medications - injection 0 INTERVENTIONS - Miscellaneous '[]'$  - External ear exam 0 '[]'$  - Specimen Collection (cultures, biopsies, blood, body fluids, etc.) 0 '[]'$  - Specimen(s) / Culture(s) sent or taken to Lab for analysis 0 '[]'$  - Patient Transfer (multiple staff / [employer] fast streamer / Similar devices) 0 '[]'$  - Simple Staple / Suture removal (25 or less) 0 '[]'$  - Complex Staple / Suture removal (26 or more) 0 '[]'$  - Hypo / Hyperglycemic Management (close monitor of Blood Glucose) 0 '[]'$  - Ankle / Brachial Index (ABI) - do not check if billed separately 0 X - Vital Signs 1 5 Has the patient been seen at the hospital within the last three years: Yes Total Score: 100 Level Of Care: New/Established - Level 3 Electronic Signature(s) Signed: 02/09/2020 6:12:10 PM By: Levan Hurst RN, BSN Entered By: Levan Hurst on 02/09/2020 09:39:34 -------------------------------------------------------------------------------- Encounter Discharge Information Details Patient Name: Date of Service: Nathan Eastern D. 02/09/2020 9:15 AM Medical Record [id number] Patient Account Number: [id number] Date of Birth/Sex: Treating RN: Sep 29, 1951 (69 y.o. [name] Primary Care Daviona Herbert: PATIENT, NO Other Clinician: Referring Wilhemina Grall: Treating Kier Smead/Extender:Robson, Otelia Sergeant, Cherre Blanc in Treatment: 6 Encounter  Discharge Information Items Discharge Condition: Stable Ambulatory Status: Cane Discharge Destination: Home Transportation: Private Auto Accompanied By: wife Schedule Follow-up Appointment: Yes Clinical Summary of Care: Electronic Signature(s) Signed: 02/09/2020 5:40:16 PM By: Deon Pilling Entered By: Deon Pilling on 02/09/2020 09:47:12 -------------------------------------------------------------------------------- Lower Extremity Assessment Details Patient Name: Date of Service: Nathan Miller, Nathan Miller 02/09/2020 9:15 AM Medical Record [id number] Patient Account Number: [id number] Date of Birth/Sex: Treating RN: 04-24-1951 (69 y.o. [name] Primary Care Kursten Kruk: PATIENT, NO Other Clinician: Referring Laquisha Northcraft: Treating Madsen Riddle/Extender:Robson, Otelia Sergeant, Cherre Blanc in Treatment: 6 Edema Assessment Assessed: [Left: No] [Right: No] Edema: [Left: Ye] [Right: s] Calf Left: Right: Point of Measurement: cm From Medial Instep cm 42 cm Ankle Left: Right: Point of Measurement: cm From Medial Instep cm 29.5 cm Vascular Assessment Pulses: Dorsalis Pedis Palpable: [Right:Yes] Electronic Signature(s) Signed: 02/09/2020 5:40:42 PM By: Kela Millin Entered By: Kela Millin on 02/09/2020 09:11:08 -------------------------------------------------------------------------------- Multi Wound Chart Details Patient Name: Date of Service: Nathan Eastern D. 02/09/2020 9:15 AM Medical Record [id number] Patient Account Number: [id number] Date of Birth/Sex: Treating RN: [birth year] (69 y.o. 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] Comorbid History: [3:Coronary Artery Disease, N/A Hypertension, Type II Diabetes, Osteoarthritis, Osteomyelitis, Neuropathy, Confinement Anxiety] Date Acquired: [3:09/30/2019] [N/A:N/A] Weeks of Treatment: [3:6] [N/A:N/A] Wound Status: [3:Open] [N/A:N/A] Measurements L x W x D 1.7x0.9x0.2 [N/A:N/A] (cm) Area (cm) : [3:1.202] [N/A:N/A] Volume (cm) : [3:0.24] [N/A:N/A] % Reduction in Area: [3:26.40%] [N/A:N/A] % Reduction in Volume: 75.50% [N/A:N/A] Starting Position 1 11 (o'clock): Ending Position 1 [  any number of wounds) 1 5 '[]'$  - Wound Tracing (instead of photographs) 0 X - Simple Wound Measurement - one wound 1 5 '[]'$  - Complex Wound Measurement - multiple wounds 0 INTERVENTIONS - Wound Dressings '[]'$  - Small Wound Dressing one or multiple wounds 0 X - Medium Wound Dressing one or multiple wounds 1 15 '[]'$  - Large Wound Dressing one or multiple wounds 0 X - Application of Medications - topical 1 5 '[]'$  - Application of Medications - injection 0 INTERVENTIONS - Miscellaneous '[]'$  - External ear exam 0 '[]'$  - Specimen Collection (cultures, biopsies, blood, body fluids, etc.) 0 '[]'$  - Specimen(s) / Culture(s) sent or taken to Lab for analysis 0 '[]'$  - Patient Transfer (multiple staff / [employer] fast streamer / Similar devices) 0 '[]'$  - Simple Staple / Suture removal (25 or less) 0 '[]'$  - Complex Staple / Suture removal (26 or more) 0 '[]'$  - Hypo / Hyperglycemic Management (close monitor of Blood Glucose) 0 '[]'$  - Ankle / Brachial Index (ABI) - do not check if billed separately 0 X - Vital Signs 1 5 Has the patient been seen at the hospital within the last three years: Yes Total Score: 100 Level Of Care: New/Established - Level 3 Electronic Signature(s) Signed: 02/09/2020 6:12:10 PM By: Levan Hurst RN, BSN Entered By: Levan Hurst on 02/09/2020 09:39:34 -------------------------------------------------------------------------------- Encounter Discharge Information Details Patient Name: Date of Service: Nathan Eastern D. 02/09/2020 9:15 AM Medical Record [id number] Patient Account Number: [id number] Date of Birth/Sex: Treating RN: Sep 29, 1951 (69 y.o. [name] Primary Care Daviona Herbert: PATIENT, NO Other Clinician: Referring Wilhemina Grall: Treating Kier Smead/Extender:Robson, Otelia Sergeant, Cherre Blanc in Treatment: 6 Encounter  Discharge Information Items Discharge Condition: Stable Ambulatory Status: Cane Discharge Destination: Home Transportation: Private Auto Accompanied By: wife Schedule Follow-up Appointment: Yes Clinical Summary of Care: Electronic Signature(s) Signed: 02/09/2020 5:40:16 PM By: Deon Pilling Entered By: Deon Pilling on 02/09/2020 09:47:12 -------------------------------------------------------------------------------- Lower Extremity Assessment Details Patient Name: Date of Service: Nathan Miller, Nathan Miller 02/09/2020 9:15 AM Medical Record [id number] Patient Account Number: [id number] Date of Birth/Sex: Treating RN: 04-24-1951 (69 y.o. [name] Primary Care Kursten Kruk: PATIENT, NO Other Clinician: Referring Laquisha Northcraft: Treating Madsen Riddle/Extender:Robson, Otelia Sergeant, Cherre Blanc in Treatment: 6 Edema Assessment Assessed: [Left: No] [Right: No] Edema: [Left: Ye] [Right: s] Calf Left: Right: Point of Measurement: cm From Medial Instep cm 42 cm Ankle Left: Right: Point of Measurement: cm From Medial Instep cm 29.5 cm Vascular Assessment Pulses: Dorsalis Pedis Palpable: [Right:Yes] Electronic Signature(s) Signed: 02/09/2020 5:40:42 PM By: Kela Millin Entered By: Kela Millin on 02/09/2020 09:11:08 -------------------------------------------------------------------------------- Multi Wound Chart Details Patient Name: Date of Service: Nathan Eastern D. 02/09/2020 9:15 AM Medical Record [id number] Patient Account Number: [id number] Date of Birth/Sex: Treating RN: [birth year] (69 y.o. 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] Comorbid History: [3:Coronary Artery Disease, N/A Hypertension, Type II Diabetes, Osteoarthritis, Osteomyelitis, Neuropathy, Confinement Anxiety] Date Acquired: [3:09/30/2019] [N/A:N/A] Weeks of Treatment: [3:6] [N/A:N/A] Wound Status: [3:Open] [N/A:N/A] Measurements L x W x D 1.7x0.9x0.2 [N/A:N/A] (cm) Area (cm) : [3:1.202] [N/A:N/A] Volume (cm) : [3:0.24] [N/A:N/A] % Reduction in Area: [3:26.40%] [N/A:N/A] % Reduction in Volume: 75.50% [N/A:N/A] Starting Position 1 11 (o'clock): Ending Position 1 [  3:3] (o'clock): Maximum Distance 1 [3:0.6] (cm): Undermining: [3:Yes] [N/A:N/A] Classification: [3:Grade 2] [N/A:N/A] Exudate Amount: [3:Medium] [N/A:N/A] Exudate Type: [3:Serosanguineous] [N/A:N/A] Exudate Color: [3:red, brown] [N/A:N/A] Wound Margin: [3:Thickened] [N/A:N/A] Granulation Amount: [3:Large (67-100%)] [N/A:N/A] Granulation Quality: [3:Red, Pink] [N/A:N/A] Necrotic Amount: [3:None Present (0%)] [N/A:N/A] Exposed Structures: [3:Fat Layer (Subcutaneous Tissue) Exposed: Yes Fascia: No Tendon: No Muscle: No Joint: No Bone: No None] [N/A:N/A N/A] Treatment Notes Electronic Signature(s) Signed: 02/09/2020 6:12:10 PM By: Levan Hurst RN, BSN Signed: 02/10/2020 5:51:00 PM By: Linton Ham MD Entered By: Linton Ham on 02/09/2020 09:45:35 -------------------------------------------------------------------------------- Multi-Disciplinary Care Plan Details Patient Name: Date of Service: Nathan Eastern D. 02/09/2020 9:15 AM Medical Record [id number] Patient Account Number: [id number] Date of Birth/Sex: Treating RN: 1951/08/18 (69 y.o. [name] Primary Care Tishawn Friedhoff: PATIENT, NO Other Clinician: Referring Indya Oliveria: Treating Lanea Vankirk/Extender:Robson, Otelia Sergeant, Cherre Blanc in Treatment:  6 Active Inactive Wound/Skin Impairment Nursing Diagnoses: Impaired tissue integrity Knowledge deficit related to ulceration/compromised skin integrity Goals: Patient/caregiver will verbalize understanding of skin care regimen Date Initiated: 12/29/2019 Target Resolution Date: 03/12/2020 Goal Status: Active Ulcer/skin breakdown will have a volume reduction of 30% by week 4 Date Initiated: 12/29/2019 Date Inactivated: 02/09/2020 Target Resolution Date: 01/30/2020 Goal Status: Met Interventions: Assess patient/caregiver ability to obtain necessary supplies Assess patient/caregiver ability to perform ulcer/skin care regimen upon admission and as needed Assess ulceration(s) every visit Provide education on ulcer and skin care Notes: Electronic Signature(s) Signed: 02/09/2020 6:12:10 PM By: Levan Hurst RN, BSN Entered By: Levan Hurst on 02/09/2020 09:17:02 -------------------------------------------------------------------------------- Pain Assessment Details Patient Name: Date of Service: Nathan Eastern D. 02/09/2020 9:15 AM Medical Record [id number] Patient Account Number: [id number] Date of Birth/Sex: Treating RN: 1951/03/22 (69 y.o. [name] Primary Care Branden Shallenberger: PATIENT, NO Other Clinician: Referring Rehana Uncapher: Treating Merton Wadlow/Extender:Robson, Otelia Sergeant, Cherre Blanc in Treatment: 6 Active Problems Location of Pain Severity and Description of Pain Patient Has Paino No Site Locations Pain Management and Medication Current Pain Management: Electronic Signature(s) Signed: 02/09/2020 5:40:42 PM By: Kela Millin Entered By: Kela Millin on 02/09/2020 09:08:50 -------------------------------------------------------------------------------- Patient/Caregiver Education Details Patient Name: Date of Service: Nathan Miller 4/12/2021andnbsp9:15 AM Medical Record [id number] Patient Account Number: [id number] Date of Birth/Gender: [birth year]  (69 y.o. M) Treating RN: Levan Hurst Primary Care Physician: PATIENT, NO Other Clinician: Referring Physician: Treating Physician/Extender:Robson, Otelia Sergeant, Cherre Blanc in Treatment: 6 Education Assessment Education Provided To: Patient Education Topics Provided Wound/Skin Impairment: Methods: Explain/Verbal Responses: State content correctly Electronic Signature(s) Signed: 02/09/2020 6:12:10 PM By: Levan Hurst RN, BSN Entered By: Levan Hurst on 02/09/2020 09:17:38 -------------------------------------------------------------------------------- Wound Assessment Details Patient Name: Date of Service: Nathan Miller, Nathan Miller 02/09/2020 9:15 AM Medical Record [id number] Patient Account Number: [id number] Date of Birth/Sex: Treating RN: 1951/04/25 (69 y.o. [name] Primary Care Chariti Havel: PATIENT, NO Other Clinician: Referring Zoha Spranger: Treating Shakea Isip/Extender:Robson, Otelia Sergeant, Cherre Blanc in Treatment: 6 Wound Status Wound Number: 3 Primary Diabetic Wound/Ulcer of the Lower Extremity Etiology: Wound Location: Right, Plantar Calcaneus Wound Open Wounding Event: Blister Status: Date Acquired: 09/30/2019 Comorbid Coronary Artery Disease, Hypertension, Type Weeks Of Treatment: 6 History: II Diabetes, Osteoarthritis, Osteomyelitis, Clustered Wound: No Neuropathy, Confinement Anxiety Wound Measurements Length: (cm) 1.7 Width: (cm) 0.9 Depth: (cm) 0.2 Area: (cm) 1.202 Volume: (cm) 0.24 % Reduction in Area: 26.4% % Reduction in Volume: 75.5% Epithelialization: None Tunneling: No Undermining: Yes Starting Position (o'clock): 11 Ending Position (o'clock): 3 Maximum Distance: (cm) 0.6 Wound Description Classification: Grade 2 Wound Margin: Thickened Exudate Amount: Medium Exudate Type: Serosanguineous Exudate Color: red, brown Wound Bed  3:3] (o'clock): Maximum Distance 1 [3:0.6] (cm): Undermining: [3:Yes] [N/A:N/A] Classification: [3:Grade 2] [N/A:N/A] Exudate Amount: [3:Medium] [N/A:N/A] Exudate Type: [3:Serosanguineous] [N/A:N/A] Exudate Color: [3:red, brown] [N/A:N/A] Wound Margin: [3:Thickened] [N/A:N/A] Granulation Amount: [3:Large (67-100%)] [N/A:N/A] Granulation Quality: [3:Red, Pink] [N/A:N/A] Necrotic Amount: [3:None Present (0%)] [N/A:N/A] Exposed Structures: [3:Fat Layer (Subcutaneous Tissue) Exposed: Yes Fascia: No Tendon: No Muscle: No Joint: No Bone: No None] [N/A:N/A N/A] Treatment Notes Electronic Signature(s) Signed: 02/09/2020 6:12:10 PM By: Levan Hurst RN, BSN Signed: 02/10/2020 5:51:00 PM By: Linton Ham MD Entered By: Linton Ham on 02/09/2020 09:45:35 -------------------------------------------------------------------------------- Multi-Disciplinary Care Plan Details Patient Name: Date of Service: Nathan Eastern D. 02/09/2020 9:15 AM Medical Record [id number] Patient Account Number: [id number] Date of Birth/Sex: Treating RN: 1951/08/18 (69 y.o. [name] Primary Care Tishawn Friedhoff: PATIENT, NO Other Clinician: Referring Indya Oliveria: Treating Lanea Vankirk/Extender:Robson, Otelia Sergeant, Cherre Blanc in Treatment:  6 Active Inactive Wound/Skin Impairment Nursing Diagnoses: Impaired tissue integrity Knowledge deficit related to ulceration/compromised skin integrity Goals: Patient/caregiver will verbalize understanding of skin care regimen Date Initiated: 12/29/2019 Target Resolution Date: 03/12/2020 Goal Status: Active Ulcer/skin breakdown will have a volume reduction of 30% by week 4 Date Initiated: 12/29/2019 Date Inactivated: 02/09/2020 Target Resolution Date: 01/30/2020 Goal Status: Met Interventions: Assess patient/caregiver ability to obtain necessary supplies Assess patient/caregiver ability to perform ulcer/skin care regimen upon admission and as needed Assess ulceration(s) every visit Provide education on ulcer and skin care Notes: Electronic Signature(s) Signed: 02/09/2020 6:12:10 PM By: Levan Hurst RN, BSN Entered By: Levan Hurst on 02/09/2020 09:17:02 -------------------------------------------------------------------------------- Pain Assessment Details Patient Name: Date of Service: Nathan Eastern D. 02/09/2020 9:15 AM Medical Record [id number] Patient Account Number: [id number] Date of Birth/Sex: Treating RN: 1951/03/22 (69 y.o. [name] Primary Care Branden Shallenberger: PATIENT, NO Other Clinician: Referring Rehana Uncapher: Treating Merton Wadlow/Extender:Robson, Otelia Sergeant, Cherre Blanc in Treatment: 6 Active Problems Location of Pain Severity and Description of Pain Patient Has Paino No Site Locations Pain Management and Medication Current Pain Management: Electronic Signature(s) Signed: 02/09/2020 5:40:42 PM By: Kela Millin Entered By: Kela Millin on 02/09/2020 09:08:50 -------------------------------------------------------------------------------- Patient/Caregiver Education Details Patient Name: Date of Service: Nathan Miller 4/12/2021andnbsp9:15 AM Medical Record [id number] Patient Account Number: [id number] Date of Birth/Gender: [birth year]  (69 y.o. M) Treating RN: Levan Hurst Primary Care Physician: PATIENT, NO Other Clinician: Referring Physician: Treating Physician/Extender:Robson, Otelia Sergeant, Cherre Blanc in Treatment: 6 Education Assessment Education Provided To: Patient Education Topics Provided Wound/Skin Impairment: Methods: Explain/Verbal Responses: State content correctly Electronic Signature(s) Signed: 02/09/2020 6:12:10 PM By: Levan Hurst RN, BSN Entered By: Levan Hurst on 02/09/2020 09:17:38 -------------------------------------------------------------------------------- Wound Assessment Details Patient Name: Date of Service: Nathan Miller, Nathan Miller 02/09/2020 9:15 AM Medical Record [id number] Patient Account Number: [id number] Date of Birth/Sex: Treating RN: 1951/04/25 (69 y.o. [name] Primary Care Chariti Havel: PATIENT, NO Other Clinician: Referring Zoha Spranger: Treating Shakea Isip/Extender:Robson, Otelia Sergeant, Cherre Blanc in Treatment: 6 Wound Status Wound Number: 3 Primary Diabetic Wound/Ulcer of the Lower Extremity Etiology: Wound Location: Right, Plantar Calcaneus Wound Open Wounding Event: Blister Status: Date Acquired: 09/30/2019 Comorbid Coronary Artery Disease, Hypertension, Type Weeks Of Treatment: 6 History: II Diabetes, Osteoarthritis, Osteomyelitis, Clustered Wound: No Neuropathy, Confinement Anxiety Wound Measurements Length: (cm) 1.7 Width: (cm) 0.9 Depth: (cm) 0.2 Area: (cm) 1.202 Volume: (cm) 0.24 % Reduction in Area: 26.4% % Reduction in Volume: 75.5% Epithelialization: None Tunneling: No Undermining: Yes Starting Position (o'clock): 11 Ending Position (o'clock): 3 Maximum Distance: (cm) 0.6 Wound Description Classification: Grade 2 Wound Margin: Thickened Exudate Amount: Medium Exudate Type: Serosanguineous Exudate Color: red, brown Wound Bed

## 2020-02-23 ENCOUNTER — Other Ambulatory Visit: Payer: Self-pay

## 2020-02-23 ENCOUNTER — Encounter (HOSPITAL_BASED_OUTPATIENT_CLINIC_OR_DEPARTMENT_OTHER): Payer: PPO | Admitting: Internal Medicine

## 2020-02-23 DIAGNOSIS — E11621 Type 2 diabetes mellitus with foot ulcer: Secondary | ICD-10-CM | POA: Diagnosis not present

## 2020-02-23 DIAGNOSIS — L97412 Non-pressure chronic ulcer of right heel and midfoot with fat layer exposed: Secondary | ICD-10-CM | POA: Diagnosis not present

## 2020-02-23 DIAGNOSIS — T8189XA Other complications of procedures, not elsewhere classified, initial encounter: Secondary | ICD-10-CM | POA: Diagnosis not present

## 2020-02-23 NOTE — Progress Notes (Signed)
not allow epithelialization Integumentary (Hair, Skin) Wound #3 status is Open. Original cause of wound was Blister. The wound is located on the Right,Plantar Calcaneus. The wound measures 1.8cm length x 0.6cm width x 0.8cm depth; 0.848cm^2 area and 0.679cm^3 volume. There is Fat Layer (Subcutaneous Tissue) Exposed exposed. There is no tunneling noted, however, there is undermining starting at 7:00 and ending at 12:00 with a maximum distance of 1.1cm. There is a medium amount of serosanguineous drainage noted. The wound margin is thickened. There is large (67-100%) red, pink granulation within the wound bed. There is no necrotic tissue within the wound bed. 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 There was a Excisional Skin/Subcutaneous Tissue Debridement with a total area of 1.08 sq cm performed by Ricard Dillon., MD. With the following instrument(s): Blade, and Forceps to remove Viable and Non-Viable tissue/material. Material removed includes Callus and Subcutaneous Tissue and after achieving pain control using Lidocaine 5% topical ointment. No specimens were taken. A time out was conducted at 09:09, prior to the start of the procedure. A Moderate amount of bleeding was controlled with Silver Nitrate. The procedure was tolerated well with a pain level of 0 throughout and a pain level of 0 following the procedure. Post Debridement Measurements: 1.8cm length x 0.6cm width x 0.8cm depth; 0.679cm^3 volume. Character of Wound/Ulcer Post Debridement is improved. Severity of Tissue Post Debridement is: Fat layer exposed. Post procedure Diagnosis Wound #3: Same as Pre-Procedure Plan Follow-up Appointments: Return Appointment in 2 weeks. Dressing Change Frequency: Wound #3 Right,Plantar Calcaneus: Change dressing every day. Wound Cleansing: Wound #3 Right,Plantar Calcaneus: May shower and wash wound with soap and water. - on days that dressing is changed Primary Wound Dressing: Wound #3 Right,Plantar Calcaneus: Calcium Alginate with Silver Secondary Dressing: Wound #3 Right,Plantar Calcaneus: Foam - foam donut Kerlix/Rolled Gauze - secure with tape Dry Gauze Off-Loading: Turn and reposition every 2 hours Other: - float heels off of bed/chair with pillow under calves 1. I think this is going to be difficult to heal as he has to wear the custom shoe with inserts and an  AFO. 2. No major way to offload this other than padding. 3. I am continuing silver alginate for another week may change to another dressing based on the wound volume next time although we will have to keep in mind that the amount of drainage that the patient describes 4. There is not a better way to offload this apparently Electronic Signature(s) Signed: 02/23/2020 5:40:04 PM By: Linton Ham MD Entered By: Linton Ham on 02/23/2020 09:21:48 -------------------------------------------------------------------------------- SuperBill Details Patient Name: Date of Service: TUG, REUM 02/23/2020 Medical Record [id number] Patient Account Number: [id number] Date of Birth/Sex: Treating RN: February 24, 1951 (69 y.o. [name] Primary Care Provider: PATIENT, NO Other Clinician: Referring Provider: Treating Provider/Extender:Budd Freiermuth, Otelia Sergeant, Cherre Blanc in Treatment: 8 Diagnosis Coding ICD-10 Codes Code Description E11.621 Type 2 diabetes mellitus with foot ulcer 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 of other part of right foot with fat layer exposed E11.42 Type 2 diabetes mellitus with diabetic polyneuropathy M14.671 Charcot's joint, right ankle and foot Facility Procedures CPT4 Code Description: [id number] - [place]  not allow epithelialization Integumentary (Hair, Skin) Wound #3 status is Open. Original cause of wound was Blister. The wound is located on the Right,Plantar Calcaneus. The wound measures 1.8cm length x 0.6cm width x 0.8cm depth; 0.848cm^2 area and 0.679cm^3 volume. There is Fat Layer (Subcutaneous Tissue) Exposed exposed. There is no tunneling noted, however, there is undermining starting at 7:00 and ending at 12:00 with a maximum distance of 1.1cm. There is a medium amount of serosanguineous drainage noted. The wound margin is thickened. There is large (67-100%) red, pink granulation within the wound bed. There is no necrotic tissue within the wound bed. 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 There was a Excisional Skin/Subcutaneous Tissue Debridement with a total area of 1.08 sq cm performed by Ricard Dillon., MD. With the following instrument(s): Blade, and Forceps to remove Viable and Non-Viable tissue/material. Material removed includes Callus and Subcutaneous Tissue and after achieving pain control using Lidocaine 5% topical ointment. No specimens were taken. A time out was conducted at 09:09, prior to the start of the procedure. A Moderate amount of bleeding was controlled with Silver Nitrate. The procedure was tolerated well with a pain level of 0 throughout and a pain level of 0 following the procedure. Post Debridement Measurements: 1.8cm length x 0.6cm width x 0.8cm depth; 0.679cm^3 volume. Character of Wound/Ulcer Post Debridement is improved. Severity of Tissue Post Debridement is: Fat layer exposed. Post procedure Diagnosis Wound #3: Same as Pre-Procedure Plan Follow-up Appointments: Return Appointment in 2 weeks. Dressing Change Frequency: Wound #3 Right,Plantar Calcaneus: Change dressing every day. Wound Cleansing: Wound #3 Right,Plantar Calcaneus: May shower and wash wound with soap and water. - on days that dressing is changed Primary Wound Dressing: Wound #3 Right,Plantar Calcaneus: Calcium Alginate with Silver Secondary Dressing: Wound #3 Right,Plantar Calcaneus: Foam - foam donut Kerlix/Rolled Gauze - secure with tape Dry Gauze Off-Loading: Turn and reposition every 2 hours Other: - float heels off of bed/chair with pillow under calves 1. I think this is going to be difficult to heal as he has to wear the custom shoe with inserts and an  AFO. 2. No major way to offload this other than padding. 3. I am continuing silver alginate for another week may change to another dressing based on the wound volume next time although we will have to keep in mind that the amount of drainage that the patient describes 4. There is not a better way to offload this apparently Electronic Signature(s) Signed: 02/23/2020 5:40:04 PM By: Linton Ham MD Entered By: Linton Ham on 02/23/2020 09:21:48 -------------------------------------------------------------------------------- SuperBill Details Patient Name: Date of Service: TUG, REUM 02/23/2020 Medical Record [id number] Patient Account Number: [id number] Date of Birth/Sex: Treating RN: February 24, 1951 (69 y.o. [name] Primary Care Provider: PATIENT, NO Other Clinician: Referring Provider: Treating Provider/Extender:Budd Freiermuth, Otelia Sergeant, Cherre Blanc in Treatment: 8 Diagnosis Coding ICD-10 Codes Code Description E11.621 Type 2 diabetes mellitus with foot ulcer 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 of other part of right foot with fat layer exposed E11.42 Type 2 diabetes mellitus with diabetic polyneuropathy M14.671 Charcot's joint, right ankle and foot Facility Procedures CPT4 Code Description: [id number] - [place]  RATHA, HARTSEL ([id number]) Visit Report for 02/23/2020 Debridement Details Patient Name: Date of Service: OLAV, ZAWADA 02/23/2020 9:15 AM Medical Record [id number] Patient Account Number: [id number] Date of Birth/Sex: Treating RN: 08-Jun-1951 (69 y.o. [name] Primary Care Provider: PATIENT, NO Other Clinician: Referring Provider: Treating Provider/Extender:Ramsay Bognar, Otelia Sergeant, Cherre Blanc in Treatment: 8 Debridement Performed for Wound #3 Right,Plantar Calcaneus Assessment: Performed By: Physician Ricard Dillon., MD Debridement Type: Debridement Severity of Tissue Pre Fat layer exposed Debridement: Level of Consciousness (Pre- Awake and Alert procedure): Pre-procedure Verification/Time Out Taken: Yes - 09:09 Start Time: 09:09 Pain Control: Lidocaine 5% topical ointment Total Area Debrided (L x W): 1.8 (cm) x 0.6 (cm) = 1.08 (cm) Tissue and other material Viable, Non-Viable, Callus, Subcutaneous debrided: Level: Skin/Subcutaneous Tissue Debridement Description: Excisional Instrument: Blade, Forceps Bleeding: Moderate Hemostasis Achieved: Silver Nitrate End Time: 09:10 Procedural Pain: 0 Post Procedural Pain: 0 Response to Treatment: Procedure was tolerated well Level of Consciousness Awake and Alert (Post-procedure): Post Debridement Measurements of Total Wound Length: (cm) 1.8 Width: (cm) 0.6 Depth: (cm) 0.8 Volume: (cm) 0.679 Character of Wound/Ulcer Post Improved Debridement: Severity of Tissue Post Debridement: Fat layer exposed Post Procedure Diagnosis Same as Pre-procedure Electronic Signature(s) Signed: 02/23/2020 5:33:33 PM By: Levan Hurst RN, BSN Signed: 02/23/2020 5:40:04 PM By: Linton Ham MD Entered By: Levan Hurst on 02/23/2020 09:15:46 -------------------------------------------------------------------------------- HPI Details Patient Name: Date of Service: [name] 02/23/2020 9:15 AM Medical Record  [id number] Patient Account Number: [id number] Date of Birth/Sex: Treating RN: 1951/02/13 (69 y.o. [name] Primary Care Provider: PATIENT, NO Other Clinician: Referring Provider: Treating Provider/Extender:Chara Marquard, Otelia Sergeant, Cherre Blanc in Treatment: 8 History of Present Illness Location: right foot Severity: Wag 3 Duration: #months Modifying Factors: Diabetic and charcot' foot.. Peripheral vascular disease Associated Signs and Symptoms: osteo HPI Description: see chief complaint. 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 This is been present for 5-6 years. He underwent a course of IV any biotics and hyperbaric oxygen roughly 5 years ago. He was offered an amputation by orthopedics and I believe at the time I concurred with this however the patient wishes to up go via wait-and-see approach. He is actually done fairly well over the 5 years is still ambulatory. He uses a silver alginate-based dressing on the foot which he changes himself up. 05/27/15; patient returns for his monthly visit the. The status of his foot and his wound are essentially unchanged this patient has a chronic idiopathic neuropathy. He has underlying osteomyelitis. He had extensive orthopedic surgery roughly 3 or 4 years ago had. He has underlying chronic osteomyelitis.[name] He dresses this with silver alginate. We order the supplies. I continue to see him on a monthly basis for wound debridement. 06/24/15 the patient arrives here today for his monthly wound care check. He has underlying chronic osteomyelitis. The wound was surgically debridement of nonviable circumferential tissue. He is continued with silver alginate based stressing severe this area changed daily by his wife. Post debridement this actually looks as good as I've seen this since quite some time 07/29/15. The patient has been coming here  monthly for many years. He has a chronic wound on his right lateral foot at roughly the mid shaft of his fifth metatarsal. Initially he underwent a code course of IV antibiotics hyperbaric oxygen, resection I believe of his fifth and fourth metatarsal for chronic osteomyelitis. 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 Original cause of wound was Blister. The wound is located on the Right,Plantar Calcaneus. The wound measures 1.8cm length x 0.6cm width x 0.8cm depth; 0.848cm^2 area and 0.679cm^3 volume. There is Fat Layer (Subcutaneous Tissue) Exposed exposed. There is no tunneling noted, however, there is undermining starting at 7:00 and ending at 12:00 with a maximum distance of 1.1cm. There is a medium amount of serosanguineous drainage noted. The wound margin is thickened. There is large (67-100%) red, pink granulation within the wound bed. There is no necrotic tissue within the wound bed. 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 There was a Excisional Skin/Subcutaneous Tissue Debridement with a total area of 1.08 sq cm performed by Ricard Dillon., MD. With the following instrument(s): Blade, and Forceps to remove Viable and Non-Viable tissue/material. Material removed includes Callus and Subcutaneous Tissue and after achieving pain control using Lidocaine 5% topical ointment. No specimens were taken. A time out was conducted at 09:09, prior to the start of the procedure. A Moderate amount of bleeding was controlled with Silver Nitrate. The procedure was tolerated well with a pain level of 0 throughout and a pain level of 0 following the procedure. Post Debridement Measurements: 1.8cm length x 0.6cm width x 0.8cm depth; 0.679cm^3 volume. Character of Wound/Ulcer Post Debridement is improved. Severity of Tissue Post Debridement is: Fat layer exposed. Post procedure Diagnosis Wound #3: Same as Pre-Procedure Plan Follow-up Appointments: Return Appointment in 2 weeks. Dressing Change Frequency: Wound #3 Right,Plantar Calcaneus: Change dressing every day. Wound Cleansing: Wound #3 Right,Plantar Calcaneus: May shower and wash wound with soap and water. - on days that dressing is changed Primary Wound Dressing: Wound #3 Right,Plantar Calcaneus: Calcium Alginate with Silver Secondary Dressing: Wound #3 Right,Plantar Calcaneus: Foam - foam donut Kerlix/Rolled Gauze - secure with tape Dry Gauze Off-Loading: Turn and reposition every 2 hours Other: - float heels off of bed/chair with pillow under calves 1. I think this is going to be difficult to heal as he has to wear the custom shoe with inserts and an  AFO. 2. No major way to offload this other than padding. 3. I am continuing silver alginate for another week may change to another dressing based on the wound volume next time although we will have to keep in mind that the amount of drainage that the patient describes 4. There is not a better way to offload this apparently Electronic Signature(s) Signed: 02/23/2020 5:40:04 PM By: Linton Ham MD Entered By: Linton Ham on 02/23/2020 09:21:48 -------------------------------------------------------------------------------- SuperBill Details Patient Name: Date of Service: TUG, REUM 02/23/2020 Medical Record [id number] Patient Account Number: [id number] Date of Birth/Sex: Treating RN: February 24, 1951 (69 y.o. [name] Primary Care Provider: PATIENT, NO Other Clinician: Referring Provider: Treating Provider/Extender:Budd Freiermuth, Otelia Sergeant, Cherre Blanc in Treatment: 8 Diagnosis Coding ICD-10 Codes Code Description E11.621 Type 2 diabetes mellitus with foot ulcer 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 of other part of right foot with fat layer exposed E11.42 Type 2 diabetes mellitus with diabetic polyneuropathy M14.671 Charcot's joint, right ankle and foot Facility Procedures CPT4 Code Description: [id number] - [place]  RATHA, HARTSEL ([id number]) Visit Report for 02/23/2020 Debridement Details Patient Name: Date of Service: OLAV, ZAWADA 02/23/2020 9:15 AM Medical Record [id number] Patient Account Number: [id number] Date of Birth/Sex: Treating RN: 08-Jun-1951 (69 y.o. [name] Primary Care Provider: PATIENT, NO Other Clinician: Referring Provider: Treating Provider/Extender:Ramsay Bognar, Otelia Sergeant, Cherre Blanc in Treatment: 8 Debridement Performed for Wound #3 Right,Plantar Calcaneus Assessment: Performed By: Physician Ricard Dillon., MD Debridement Type: Debridement Severity of Tissue Pre Fat layer exposed Debridement: Level of Consciousness (Pre- Awake and Alert procedure): Pre-procedure Verification/Time Out Taken: Yes - 09:09 Start Time: 09:09 Pain Control: Lidocaine 5% topical ointment Total Area Debrided (L x W): 1.8 (cm) x 0.6 (cm) = 1.08 (cm) Tissue and other material Viable, Non-Viable, Callus, Subcutaneous debrided: Level: Skin/Subcutaneous Tissue Debridement Description: Excisional Instrument: Blade, Forceps Bleeding: Moderate Hemostasis Achieved: Silver Nitrate End Time: 09:10 Procedural Pain: 0 Post Procedural Pain: 0 Response to Treatment: Procedure was tolerated well Level of Consciousness Awake and Alert (Post-procedure): Post Debridement Measurements of Total Wound Length: (cm) 1.8 Width: (cm) 0.6 Depth: (cm) 0.8 Volume: (cm) 0.679 Character of Wound/Ulcer Post Improved Debridement: Severity of Tissue Post Debridement: Fat layer exposed Post Procedure Diagnosis Same as Pre-procedure Electronic Signature(s) Signed: 02/23/2020 5:33:33 PM By: Levan Hurst RN, BSN Signed: 02/23/2020 5:40:04 PM By: Linton Ham MD Entered By: Levan Hurst on 02/23/2020 09:15:46 -------------------------------------------------------------------------------- HPI Details Patient Name: Date of Service: [name] 02/23/2020 9:15 AM Medical Record  [id number] Patient Account Number: [id number] Date of Birth/Sex: Treating RN: 1951/02/13 (69 y.o. [name] Primary Care Provider: PATIENT, NO Other Clinician: Referring Provider: Treating Provider/Extender:Chara Marquard, Otelia Sergeant, Cherre Blanc in Treatment: 8 History of Present Illness Location: right foot Severity: Wag 3 Duration: #months Modifying Factors: Diabetic and charcot' foot.. Peripheral vascular disease Associated Signs and Symptoms: osteo HPI Description: see chief complaint. 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 This is been present for 5-6 years. He underwent a course of IV any biotics and hyperbaric oxygen roughly 5 years ago. He was offered an amputation by orthopedics and I believe at the time I concurred with this however the patient wishes to up go via wait-and-see approach. He is actually done fairly well over the 5 years is still ambulatory. He uses a silver alginate-based dressing on the foot which he changes himself up. 05/27/15; patient returns for his monthly visit the. The status of his foot and his wound are essentially unchanged this patient has a chronic idiopathic neuropathy. He has underlying osteomyelitis. He had extensive orthopedic surgery roughly 3 or 4 years ago had. He has underlying chronic osteomyelitis.[name] He dresses this with silver alginate. We order the supplies. I continue to see him on a monthly basis for wound debridement. 06/24/15 the patient arrives here today for his monthly wound care check. He has underlying chronic osteomyelitis. The wound was surgically debridement of nonviable circumferential tissue. He is continued with silver alginate based stressing severe this area changed daily by his wife. Post debridement this actually looks as good as I've seen this since quite some time 07/29/15. The patient has been coming here  monthly for many years. He has a chronic wound on his right lateral foot at roughly the mid shaft of his fifth metatarsal. Initially he underwent a code course of IV antibiotics hyperbaric oxygen, resection I believe of his fifth and fourth metatarsal for chronic osteomyelitis. 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 [name] Primary Care Provider: PATIENT, NO Other Clinician: Referring Provider: Treating Provider/Extender:Ramsay Bognar, Otelia Sergeant, Cherre Blanc in Treatment: 8 Debridement Performed for Wound #3 Right,Plantar Calcaneus Assessment: Performed By: Physician Ricard Dillon., MD Debridement Type: Debridement Severity of Tissue Pre Fat layer exposed Debridement: Level of Consciousness (Pre- Awake and Alert procedure): Pre-procedure Verification/Time Out Taken: Yes - 09:09 Start Time: 09:09 Pain Control: Lidocaine 5% topical ointment Total Area Debrided (L x W): 1.8 (cm) x 0.6 (cm) = 1.08 (cm) Tissue and other material Viable, Non-Viable, Callus, Subcutaneous debrided: Level: Skin/Subcutaneous Tissue Debridement Description: Excisional Instrument: Blade, Forceps Bleeding: Moderate Hemostasis Achieved: Silver Nitrate End Time: 09:10 Procedural Pain: 0 Post Procedural Pain: 0 Response to Treatment: Procedure was tolerated well Level of Consciousness Awake and Alert (Post-procedure): Post Debridement Measurements of Total Wound Length: (cm) 1.8 Width: (cm) 0.6 Depth: (cm) 0.8 Volume: (cm) 0.679 Character of Wound/Ulcer Post Improved Debridement: Severity of Tissue Post Debridement: Fat layer exposed Post Procedure Diagnosis Same as Pre-procedure Electronic Signature(s) Signed: 02/23/2020 5:33:33 PM By: Levan Hurst RN, BSN Signed: 02/23/2020 5:40:04 PM By: Linton Ham MD Entered By: Levan Hurst on 02/23/2020 09:15:46 -------------------------------------------------------------------------------- HPI Details Patient Name: Date of Service: [name] 02/23/2020 9:15 AM Medical Record  [id number] Patient Account Number: [id number] Date of Birth/Sex: Treating RN: 1951/02/13 (69 y.o. [name] Primary Care Provider: PATIENT, NO Other Clinician: Referring Provider: Treating Provider/Extender:Chara Marquard, Otelia Sergeant, Cherre Blanc in Treatment: 8 History of Present Illness Location: right foot Severity: Wag 3 Duration: #months Modifying Factors: Diabetic and charcot' foot.. Peripheral vascular disease Associated Signs and Symptoms: osteo HPI Description: see chief complaint. 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 This is been present for 5-6 years. He underwent a course of IV any biotics and hyperbaric oxygen roughly 5 years ago. He was offered an amputation by orthopedics and I believe at the time I concurred with this however the patient wishes to up go via wait-and-see approach. He is actually done fairly well over the 5 years is still ambulatory. He uses a silver alginate-based dressing on the foot which he changes himself up. 05/27/15; patient returns for his monthly visit the. The status of his foot and his wound are essentially unchanged this patient has a chronic idiopathic neuropathy. He has underlying osteomyelitis. He had extensive orthopedic surgery roughly 3 or 4 years ago had. He has underlying chronic osteomyelitis.[name] He dresses this with silver alginate. We order the supplies. I continue to see him on a monthly basis for wound debridement. 06/24/15 the patient arrives here today for his monthly wound care check. He has underlying chronic osteomyelitis. The wound was surgically debridement of nonviable circumferential tissue. He is continued with silver alginate based stressing severe this area changed daily by his wife. Post debridement this actually looks as good as I've seen this since quite some time 07/29/15. The patient has been coming here  monthly for many years. He has a chronic wound on his right lateral foot at roughly the mid shaft of his fifth metatarsal. Initially he underwent a code course of IV antibiotics hyperbaric oxygen, resection I believe of his fifth and fourth metatarsal for chronic osteomyelitis. 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 Original cause of wound was Blister. The wound is located on the Right,Plantar Calcaneus. The wound measures 1.8cm length x 0.6cm width x 0.8cm depth; 0.848cm^2 area and 0.679cm^3 volume. There is Fat Layer (Subcutaneous Tissue) Exposed exposed. There is no tunneling noted, however, there is undermining starting at 7:00 and ending at 12:00 with a maximum distance of 1.1cm. There is a medium amount of serosanguineous drainage noted. The wound margin is thickened. There is large (67-100%) red, pink granulation within the wound bed. There is no necrotic tissue within the wound bed. 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 There was a Excisional Skin/Subcutaneous Tissue Debridement with a total area of 1.08 sq cm performed by Ricard Dillon., MD. With the following instrument(s): Blade, and Forceps to remove Viable and Non-Viable tissue/material. Material removed includes Callus and Subcutaneous Tissue and after achieving pain control using Lidocaine 5% topical ointment. No specimens were taken. A time out was conducted at 09:09, prior to the start of the procedure. A Moderate amount of bleeding was controlled with Silver Nitrate. The procedure was tolerated well with a pain level of 0 throughout and a pain level of 0 following the procedure. Post Debridement Measurements: 1.8cm length x 0.6cm width x 0.8cm depth; 0.679cm^3 volume. Character of Wound/Ulcer Post Debridement is improved. Severity of Tissue Post Debridement is: Fat layer exposed. Post procedure Diagnosis Wound #3: Same as Pre-Procedure Plan Follow-up Appointments: Return Appointment in 2 weeks. Dressing Change Frequency: Wound #3 Right,Plantar Calcaneus: Change dressing every day. Wound Cleansing: Wound #3 Right,Plantar Calcaneus: May shower and wash wound with soap and water. - on days that dressing is changed Primary Wound Dressing: Wound #3 Right,Plantar Calcaneus: Calcium Alginate with Silver Secondary Dressing: Wound #3 Right,Plantar Calcaneus: Foam - foam donut Kerlix/Rolled Gauze - secure with tape Dry Gauze Off-Loading: Turn and reposition every 2 hours Other: - float heels off of bed/chair with pillow under calves 1. I think this is going to be difficult to heal as he has to wear the custom shoe with inserts and an  AFO. 2. No major way to offload this other than padding. 3. I am continuing silver alginate for another week may change to another dressing based on the wound volume next time although we will have to keep in mind that the amount of drainage that the patient describes 4. There is not a better way to offload this apparently Electronic Signature(s) Signed: 02/23/2020 5:40:04 PM By: Linton Ham MD Entered By: Linton Ham on 02/23/2020 09:21:48 -------------------------------------------------------------------------------- SuperBill Details Patient Name: Date of Service: TUG, REUM 02/23/2020 Medical Record [id number] Patient Account Number: [id number] Date of Birth/Sex: Treating RN: February 24, 1951 (69 y.o. [name] Primary Care Provider: PATIENT, NO Other Clinician: Referring Provider: Treating Provider/Extender:Budd Freiermuth, Otelia Sergeant, Cherre Blanc in Treatment: 8 Diagnosis Coding ICD-10 Codes Code Description E11.621 Type 2 diabetes mellitus with foot ulcer 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 of other part of right foot with fat layer exposed E11.42 Type 2 diabetes mellitus with diabetic polyneuropathy M14.671 Charcot's joint, right ankle and foot Facility Procedures CPT4 Code Description: [id number] - [place]  Smyser is a now 69 year old man who is in this clinic many years ago for osteomyelitis and a wound on his right lateral foot. 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 He has a Charcot foot deformity. He had extensive orthopedic surgery on this foot as well removing under the underlying metatarsals that at the time had osteomyelitis. We followed him for a long period palliatively for a deep wound on the right lateral foot. He had refused an amputation probably 8 or 9 years ago and underwent IV antibiotics and hyperbaric oxygen. Miraculously this wound actually healed over I believe in 2019. The patient has a wound on the right lateral plantar heel which is not in the same spot as the previous wound. This is a weightbearing surface. The wound looks fairly benign but there is significant undermining. No erythema. They state that is started as a blister they used leftover silver alginate, Neosporin and trying to offload this as best he can. He has custom-made shoes with an AFO brace on the right. Past medical history is reviewed predominantly coronary artery disease, pacemaker, stent, type 2 diabetes  with a recent hemoglobin A1c of 6.1. ABI in our clinic was 1.0 on the right 3/22; areas on the right lateral heel not any better than last time. He has some undermining medially. This is undoubtedly a weightbearing surface here. This will not be easy to heal. 3/29; this is a patient with type 2 diabetes and her Charcot foot. He has an area on the plantar heel on the right. We have been using silver alginate. He is his own special boot with a brace. 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 Areas on the plantar heel on the right. Improvement in depth. We have been using silver alginate 4/26 Charcot foot. Right plantar heel lateral aspect. No real improvement today. We have been using silver alginate because of drainage. This is the reason he does not want a total contact cast he says the dressing simply needs to be changed daily. He has a new custom shoe with an AFO brace on the right. He states the wound started in his old shoes Electronic Signature(s) Signed: 02/23/2020 5:40:04 PM By: Linton Ham MD Entered By: Linton Ham on 02/23/2020 09:19:35 -------------------------------------------------------------------------------- Physical Exam Details Patient Name: Date of Service: BLAIR, BRULL 02/23/2020 9:15 AM Medical Record [id number] Patient Account Number: [id number] Date of Birth/Sex: Treating RN: 1951-01-22 (69 y.o. [name] Primary Care Provider: PATIENT, NO Other Clinician: Referring Provider: Treating Provider/Extender:Lenell Mcconnell, Otelia Sergeant, Cherre Blanc in Treatment: 8 Constitutional Patient is hypertensive.. Pulse regular and within target range for patient.[name] Respirations regular, non-labored and within target range.. Temperature is normal and within the target range for the patient.[name] Appears in no distress. Notes Wound exam; area questions on the right heel. Really no improvement perhaps slightly larger. He has overhanging skin and subcutaneous  tissue around the wound margin I removed this with pickups and a #15 scalpel hemostasis with silver nitrate. The base of the wound itself does not look too bad however the continued overhanging tissue from the wound margins will not allow epithelialization Electronic Signature(s) Signed: 02/23/2020 5:40:04 PM By: Linton Ham MD Entered By: Linton Ham on 02/23/2020 09:20:38 -------------------------------------------------------------------------------- Physician Orders Details Patient Name: Date of Service: ZIDAAN, WONDERS 02/23/2020 9:15 AM Medical Record [id number] Patient Account Number: [id number] Date of Birth/Sex: Treating RN: [birth year] (69 y.o. M)  Levan Hurst Primary Care Provider: PATIENT, NO Other Clinician: Referring Provider: Treating Provider/Extender:Warwick Nick, Otelia Sergeant, Cherre Blanc in Treatment: 8 Verbal / Phone Orders: No Diagnosis Coding ICD-10 Coding Code Description E11.621 Type 2 diabetes mellitus with foot ulcer 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 of other part of right foot with fat layer exposed E11.42 Type 2 diabetes mellitus with diabetic polyneuropathy M14.671 Charcot's joint, right ankle and foot Follow-up Appointments Return Appointment in 2 weeks. Dressing Change Frequency Wound #3 Right,Plantar Calcaneus Change dressing every day. Wound Cleansing Wound #3 Right,Plantar Calcaneus May shower and wash wound with soap and water. - on days that dressing is changed Primary Wound Dressing Wound #3 Right,Plantar Calcaneus Calcium Alginate with Silver Secondary Dressing Wound #3 Right,Plantar Calcaneus Foam - foam donut Kerlix/Rolled Gauze - secure with tape Dry Gauze Off-Loading Turn and reposition every 2 hours Other: - float heels off of bed/chair with pillow under calves Electronic Signature(s) Signed: 02/23/2020 5:33:33 PM By: Levan Hurst RN, BSN Signed: 02/23/2020 5:40:04 PM By: Linton Ham MD Entered By: Levan Hurst on 02/23/2020 08:50:22 -------------------------------------------------------------------------------- Problem List Details Patient Name: Date of Service: TRASHON, POWNALL 02/23/2020 9:15 AM Medical Record [id number] Patient Account Number: [id number] Date of Birth/Sex: Treating RN: 05-Feb-1951 (69 y.o. 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 [name] Primary Care Provider: PATIENT, NO Other Clinician: Referring Provider: Treating Provider/Extender:Ayra Hodgdon, Otelia Sergeant, Cherre Blanc in Treatment: 8 Subjective History of Present Illness (HPI) The following HPI elements were documented for the patient's wound: Location: right foot Severity: Wag 3 Duration: #months Modifying Factors: Diabetic and charcot' foot.. Peripheral vascular disease Associated Signs and Symptoms: osteo see chief complaint. Patient follows here roughly monthly for treatment of a chronic wound on his right lateral foot roughly at the  midshaft of his fifth metatarsal. This is been present for 5-6 years. He underwent a course of IV any biotics and hyperbaric oxygen roughly 5 years ago. He was offered an amputation by orthopedics and I believe at the time I concurred with this however the patient wishes to up go via wait-and-see approach. He is actually done fairly well over the 5 years is still ambulatory. He uses a silver alginate-based dressing on the foot which he changes himself up. 05/27/15; patient returns for his monthly visit the. The status of his foot and his wound are essentially unchanged this patient has a chronic idiopathic neuropathy. He has underlying osteomyelitis. He had extensive orthopedic surgery roughly 3 or 4 years ago had. He has underlying chronic osteomyelitis.[name] He dresses this with silver alginate. We order the supplies. I continue to see him on a monthly basis for wound debridement. 06/24/15 the patient arrives here today for his monthly wound care check. He has underlying chronic osteomyelitis. The wound was surgically debridement of nonviable circumferential  Smyser is a now 69 year old man who is in this clinic many years ago for osteomyelitis and a wound on his right lateral foot. 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 He has a Charcot foot deformity. He had extensive orthopedic surgery on this foot as well removing under the underlying metatarsals that at the time had osteomyelitis. We followed him for a long period palliatively for a deep wound on the right lateral foot. He had refused an amputation probably 8 or 9 years ago and underwent IV antibiotics and hyperbaric oxygen. Miraculously this wound actually healed over I believe in 2019. The patient has a wound on the right lateral plantar heel which is not in the same spot as the previous wound. This is a weightbearing surface. The wound looks fairly benign but there is significant undermining. No erythema. They state that is started as a blister they used leftover silver alginate, Neosporin and trying to offload this as best he can. He has custom-made shoes with an AFO brace on the right. Past medical history is reviewed predominantly coronary artery disease, pacemaker, stent, type 2 diabetes  with a recent hemoglobin A1c of 6.1. ABI in our clinic was 1.0 on the right 3/22; areas on the right lateral heel not any better than last time. He has some undermining medially. This is undoubtedly a weightbearing surface here. This will not be easy to heal. 3/29; this is a patient with type 2 diabetes and her Charcot foot. He has an area on the plantar heel on the right. We have been using silver alginate. He is his own special boot with a brace. 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 Areas on the plantar heel on the right. Improvement in depth. We have been using silver alginate 4/26 Charcot foot. Right plantar heel lateral aspect. No real improvement today. We have been using silver alginate because of drainage. This is the reason he does not want a total contact cast he says the dressing simply needs to be changed daily. He has a new custom shoe with an AFO brace on the right. He states the wound started in his old shoes Electronic Signature(s) Signed: 02/23/2020 5:40:04 PM By: Linton Ham MD Entered By: Linton Ham on 02/23/2020 09:19:35 -------------------------------------------------------------------------------- Physical Exam Details Patient Name: Date of Service: BLAIR, BRULL 02/23/2020 9:15 AM Medical Record [id number] Patient Account Number: [id number] Date of Birth/Sex: Treating RN: 1951-01-22 (69 y.o. [name] Primary Care Provider: PATIENT, NO Other Clinician: Referring Provider: Treating Provider/Extender:Lenell Mcconnell, Otelia Sergeant, Cherre Blanc in Treatment: 8 Constitutional Patient is hypertensive.. Pulse regular and within target range for patient.[name] Respirations regular, non-labored and within target range.. Temperature is normal and within the target range for the patient.[name] Appears in no distress. Notes Wound exam; area questions on the right heel. Really no improvement perhaps slightly larger. He has overhanging skin and subcutaneous  tissue around the wound margin I removed this with pickups and a #15 scalpel hemostasis with silver nitrate. The base of the wound itself does not look too bad however the continued overhanging tissue from the wound margins will not allow epithelialization Electronic Signature(s) Signed: 02/23/2020 5:40:04 PM By: Linton Ham MD Entered By: Linton Ham on 02/23/2020 09:20:38 -------------------------------------------------------------------------------- Physician Orders Details Patient Name: Date of Service: ZIDAAN, WONDERS 02/23/2020 9:15 AM Medical Record [id number] Patient Account Number: [id number] Date of Birth/Sex: Treating RN: [birth year] (69 y.o. M)  Levan Hurst Primary Care Provider: PATIENT, NO Other Clinician: Referring Provider: Treating Provider/Extender:Warwick Nick, Otelia Sergeant, Cherre Blanc in Treatment: 8 Verbal / Phone Orders: No Diagnosis Coding ICD-10 Coding Code Description E11.621 Type 2 diabetes mellitus with foot ulcer 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 of other part of right foot with fat layer exposed E11.42 Type 2 diabetes mellitus with diabetic polyneuropathy M14.671 Charcot's joint, right ankle and foot Follow-up Appointments Return Appointment in 2 weeks. Dressing Change Frequency Wound #3 Right,Plantar Calcaneus Change dressing every day. Wound Cleansing Wound #3 Right,Plantar Calcaneus May shower and wash wound with soap and water. - on days that dressing is changed Primary Wound Dressing Wound #3 Right,Plantar Calcaneus Calcium Alginate with Silver Secondary Dressing Wound #3 Right,Plantar Calcaneus Foam - foam donut Kerlix/Rolled Gauze - secure with tape Dry Gauze Off-Loading Turn and reposition every 2 hours Other: - float heels off of bed/chair with pillow under calves Electronic Signature(s) Signed: 02/23/2020 5:33:33 PM By: Levan Hurst RN, BSN Signed: 02/23/2020 5:40:04 PM By: Linton Ham MD Entered By: Levan Hurst on 02/23/2020 08:50:22 -------------------------------------------------------------------------------- Problem List Details Patient Name: Date of Service: TRASHON, POWNALL 02/23/2020 9:15 AM Medical Record [id number] Patient Account Number: [id number] Date of Birth/Sex: Treating RN: 05-Feb-1951 (69 y.o. 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 [name] Primary Care Provider: PATIENT, NO Other Clinician: Referring Provider: Treating Provider/Extender:Ayra Hodgdon, Otelia Sergeant, Cherre Blanc in Treatment: 8 Subjective History of Present Illness (HPI) The following HPI elements were documented for the patient's wound: Location: right foot Severity: Wag 3 Duration: #months Modifying Factors: Diabetic and charcot' foot.. Peripheral vascular disease Associated Signs and Symptoms: osteo see chief complaint. Patient follows here roughly monthly for treatment of a chronic wound on his right lateral foot roughly at the  midshaft of his fifth metatarsal. This is been present for 5-6 years. He underwent a course of IV any biotics and hyperbaric oxygen roughly 5 years ago. He was offered an amputation by orthopedics and I believe at the time I concurred with this however the patient wishes to up go via wait-and-see approach. He is actually done fairly well over the 5 years is still ambulatory. He uses a silver alginate-based dressing on the foot which he changes himself up. 05/27/15; patient returns for his monthly visit the. The status of his foot and his wound are essentially unchanged this patient has a chronic idiopathic neuropathy. He has underlying osteomyelitis. He had extensive orthopedic surgery roughly 3 or 4 years ago had. He has underlying chronic osteomyelitis.[name] He dresses this with silver alginate. We order the supplies. I continue to see him on a monthly basis for wound debridement. 06/24/15 the patient arrives here today for his monthly wound care check. He has underlying chronic osteomyelitis. The wound was surgically debridement of nonviable circumferential  RATHA, HARTSEL ([id number]) Visit Report for 02/23/2020 Debridement Details Patient Name: Date of Service: OLAV, ZAWADA 02/23/2020 9:15 AM Medical Record [id number] Patient Account Number: [id number] Date of Birth/Sex: Treating RN: 08-Jun-1951 (69 y.o. [name] Primary Care Provider: PATIENT, NO Other Clinician: Referring Provider: Treating Provider/Extender:Ramsay Bognar, Otelia Sergeant, Cherre Blanc in Treatment: 8 Debridement Performed for Wound #3 Right,Plantar Calcaneus Assessment: Performed By: Physician Ricard Dillon., MD Debridement Type: Debridement Severity of Tissue Pre Fat layer exposed Debridement: Level of Consciousness (Pre- Awake and Alert procedure): Pre-procedure Verification/Time Out Taken: Yes - 09:09 Start Time: 09:09 Pain Control: Lidocaine 5% topical ointment Total Area Debrided (L x W): 1.8 (cm) x 0.6 (cm) = 1.08 (cm) Tissue and other material Viable, Non-Viable, Callus, Subcutaneous debrided: Level: Skin/Subcutaneous Tissue Debridement Description: Excisional Instrument: Blade, Forceps Bleeding: Moderate Hemostasis Achieved: Silver Nitrate End Time: 09:10 Procedural Pain: 0 Post Procedural Pain: 0 Response to Treatment: Procedure was tolerated well Level of Consciousness Awake and Alert (Post-procedure): Post Debridement Measurements of Total Wound Length: (cm) 1.8 Width: (cm) 0.6 Depth: (cm) 0.8 Volume: (cm) 0.679 Character of Wound/Ulcer Post Improved Debridement: Severity of Tissue Post Debridement: Fat layer exposed Post Procedure Diagnosis Same as Pre-procedure Electronic Signature(s) Signed: 02/23/2020 5:33:33 PM By: Levan Hurst RN, BSN Signed: 02/23/2020 5:40:04 PM By: Linton Ham MD Entered By: Levan Hurst on 02/23/2020 09:15:46 -------------------------------------------------------------------------------- HPI Details Patient Name: Date of Service: [name] 02/23/2020 9:15 AM Medical Record  [id number] Patient Account Number: [id number] Date of Birth/Sex: Treating RN: 1951/02/13 (69 y.o. [name] Primary Care Provider: PATIENT, NO Other Clinician: Referring Provider: Treating Provider/Extender:Chara Marquard, Otelia Sergeant, Cherre Blanc in Treatment: 8 History of Present Illness Location: right foot Severity: Wag 3 Duration: #months Modifying Factors: Diabetic and charcot' foot.. Peripheral vascular disease Associated Signs and Symptoms: osteo HPI Description: see chief complaint. 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 This is been present for 5-6 years. He underwent a course of IV any biotics and hyperbaric oxygen roughly 5 years ago. He was offered an amputation by orthopedics and I believe at the time I concurred with this however the patient wishes to up go via wait-and-see approach. He is actually done fairly well over the 5 years is still ambulatory. He uses a silver alginate-based dressing on the foot which he changes himself up. 05/27/15; patient returns for his monthly visit the. The status of his foot and his wound are essentially unchanged this patient has a chronic idiopathic neuropathy. He has underlying osteomyelitis. He had extensive orthopedic surgery roughly 3 or 4 years ago had. He has underlying chronic osteomyelitis.[name] He dresses this with silver alginate. We order the supplies. I continue to see him on a monthly basis for wound debridement. 06/24/15 the patient arrives here today for his monthly wound care check. He has underlying chronic osteomyelitis. The wound was surgically debridement of nonviable circumferential tissue. He is continued with silver alginate based stressing severe this area changed daily by his wife. Post debridement this actually looks as good as I've seen this since quite some time 07/29/15. The patient has been coming here  monthly for many years. He has a chronic wound on his right lateral foot at roughly the mid shaft of his fifth metatarsal. Initially he underwent a code course of IV antibiotics hyperbaric oxygen, resection I believe of his fifth and fourth metatarsal for chronic osteomyelitis. 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 Dressing Change Frequency Wound #3 Right,Plantar Calcaneus Change dressing every day. Wound Cleansing Wound #3 Right,Plantar Calcaneus May shower and wash wound with soap and water. - on days that dressing is changed Primary Wound Dressing Wound #3 Right,Plantar Calcaneus Calcium Alginate with Silver Secondary Dressing Wound #3 Right,Plantar Calcaneus Foam - foam donut Kerlix/Rolled Gauze - secure with tape Dry Gauze Off-Loading Turn and reposition every 2 hours Other: - float heels off of bed/chair with pillow under calves Electronic Signature(s) Signed: 02/23/2020 5:33:33 PM By: Levan Hurst RN, BSN Signed: 02/23/2020 5:40:04 PM By: Linton Ham MD Entered By: Levan Hurst on 02/23/2020 08:50:22 -------------------------------------------------------------------------------- Problem List Details Patient Name: Date of Service: TRASHON, POWNALL 02/23/2020 9:15 AM Medical Record [id number] Patient Account Number: [id number] Date of Birth/Sex: Treating RN: 05-Feb-1951 (69 y.o. 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 [name] Primary Care Provider: PATIENT, NO Other Clinician: Referring Provider: Treating Provider/Extender:Ayra Hodgdon, Otelia Sergeant, Cherre Blanc in Treatment: 8 Subjective History of Present Illness (HPI) The following HPI elements were documented for the patient's wound: Location: right foot Severity: Wag 3 Duration: #months Modifying Factors: Diabetic and charcot' foot.. Peripheral vascular disease Associated Signs and Symptoms: osteo see chief complaint. Patient follows here roughly monthly for treatment of a chronic wound on his right lateral foot roughly at the  midshaft of his fifth metatarsal. This is been present for 5-6 years. He underwent a course of IV any biotics and hyperbaric oxygen roughly 5 years ago. He was offered an amputation by orthopedics and I believe at the time I concurred with this however the patient wishes to up go via wait-and-see approach. He is actually done fairly well over the 5 years is still ambulatory. He uses a silver alginate-based dressing on the foot which he changes himself up. 05/27/15; patient returns for his monthly visit the. The status of his foot and his wound are essentially unchanged this patient has a chronic idiopathic neuropathy. He has underlying osteomyelitis. He had extensive orthopedic surgery roughly 3 or 4 years ago had. He has underlying chronic osteomyelitis.[name] He dresses this with silver alginate. We order the supplies. I continue to see him on a monthly basis for wound debridement. 06/24/15 the patient arrives here today for his monthly wound care check. He has underlying chronic osteomyelitis. The wound was surgically debridement of nonviable circumferential  Smyser is a now 69 year old man who is in this clinic many years ago for osteomyelitis and a wound on his right lateral foot. 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 He has a Charcot foot deformity. He had extensive orthopedic surgery on this foot as well removing under the underlying metatarsals that at the time had osteomyelitis. We followed him for a long period palliatively for a deep wound on the right lateral foot. He had refused an amputation probably 8 or 9 years ago and underwent IV antibiotics and hyperbaric oxygen. Miraculously this wound actually healed over I believe in 2019. The patient has a wound on the right lateral plantar heel which is not in the same spot as the previous wound. This is a weightbearing surface. The wound looks fairly benign but there is significant undermining. No erythema. They state that is started as a blister they used leftover silver alginate, Neosporin and trying to offload this as best he can. He has custom-made shoes with an AFO brace on the right. Past medical history is reviewed predominantly coronary artery disease, pacemaker, stent, type 2 diabetes  with a recent hemoglobin A1c of 6.1. ABI in our clinic was 1.0 on the right 3/22; areas on the right lateral heel not any better than last time. He has some undermining medially. This is undoubtedly a weightbearing surface here. This will not be easy to heal. 3/29; this is a patient with type 2 diabetes and her Charcot foot. He has an area on the plantar heel on the right. We have been using silver alginate. He is his own special boot with a brace. 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 Areas on the plantar heel on the right. Improvement in depth. We have been using silver alginate 4/26 Charcot foot. Right plantar heel lateral aspect. No real improvement today. We have been using silver alginate because of drainage. This is the reason he does not want a total contact cast he says the dressing simply needs to be changed daily. He has a new custom shoe with an AFO brace on the right. He states the wound started in his old shoes Electronic Signature(s) Signed: 02/23/2020 5:40:04 PM By: Linton Ham MD Entered By: Linton Ham on 02/23/2020 09:19:35 -------------------------------------------------------------------------------- Physical Exam Details Patient Name: Date of Service: BLAIR, BRULL 02/23/2020 9:15 AM Medical Record [id number] Patient Account Number: [id number] Date of Birth/Sex: Treating RN: 1951-01-22 (69 y.o. [name] Primary Care Provider: PATIENT, NO Other Clinician: Referring Provider: Treating Provider/Extender:Lenell Mcconnell, Otelia Sergeant, Cherre Blanc in Treatment: 8 Constitutional Patient is hypertensive.. Pulse regular and within target range for patient.[name] Respirations regular, non-labored and within target range.. Temperature is normal and within the target range for the patient.[name] Appears in no distress. Notes Wound exam; area questions on the right heel. Really no improvement perhaps slightly larger. He has overhanging skin and subcutaneous  tissue around the wound margin I removed this with pickups and a #15 scalpel hemostasis with silver nitrate. The base of the wound itself does not look too bad however the continued overhanging tissue from the wound margins will not allow epithelialization Electronic Signature(s) Signed: 02/23/2020 5:40:04 PM By: Linton Ham MD Entered By: Linton Ham on 02/23/2020 09:20:38 -------------------------------------------------------------------------------- Physician Orders Details Patient Name: Date of Service: ZIDAAN, WONDERS 02/23/2020 9:15 AM Medical Record [id number] Patient Account Number: [id number] Date of Birth/Sex: Treating RN: [birth year] (69 y.o. M)

## 2020-02-24 NOTE — Progress Notes (Signed)
brown] [N/A:N/A] Wound Margin: [3:Thickened] [N/A:N/A] Granulation Amount: [3:Large (67-100%)] [N/A:N/A] Granulation Quality: [3:Red, Pink] [N/A:N/A] Necrotic Amount: [3:None Present (0%)] [N/A:N/A] Exposed Structures: [3:Fat Layer (Subcutaneous N/A Tissue) Exposed: Yes Fascia: Miller Tendon: Miller Muscle: Miller Joint: Miller Bone: Miller] Epithelialization: [3:None] [N/A:N/A] Debridement: [3:Debridement - Excisional] [N/A:N/A] Pre-procedure [3:09:09] [N/A:N/A] Verification/Time Out Taken: Pain Control: [3:Lidocaine 5% topical ointment] [N/A:N/A] Tissue Debrided: [3:Callus, Subcutaneous] [N/A:N/A] Level: [3:Skin/Subcutaneous Tissue] [N/A:N/A] Debridement Area (sq cm):1.08 [N/A:N/A] Instrument: [3:Blade, Forceps] [N/A:N/A] Bleeding: [3:Moderate] [N/A:N/A] Hemostasis Achieved: [3:Silver Nitrate] [N/A:N/A] Procedural Pain: [3:0] [N/A:N/A] Post Procedural Pain: [3:0] [N/A:N/A] Debridement Treatment Procedure was tolerated [N/A:N/A] Response:  [3:well] Post Debridement [3:1.8x0.6x0.8] [N/A:N/A] Measurements L x W x D (cm) Post Debridement [3:0.679] [N/A:N/A] Volume: (cm) Procedures Performed: Debridement [N/A:N/A] Treatment Notes Electronic Signature(s) Signed: 02/23/2020 5:33:33 PM By: Nathan Hurst RN, BSN Signed: 02/23/2020 5:40:04 PM By: Nathan Ham MD Entered By: Nathan Miller on 02/23/2020 09:17:31 -------------------------------------------------------------------------------- Multi-Disciplinary Care Plan Details Patient Name: Date of Service: Nathan Miller, Nathan Miller 02/23/2020 9:15 AM Medical Record [id number] Patient Account Number: [id number] Date of Birth/Sex: Treating RN: [birth year] (69 y.o. Nathan Miller Primary Care Nathan Miller: Nathan Miller Other Clinician: Referring Nyshaun Standage: Treating Vela Render/Extender:Nathan Miller, Nathan Miller in Treatment: 8 Active Inactive Wound/Skin Impairment Nursing Diagnoses: Impaired tissue integrity Knowledge deficit related to ulceration/compromised skin integrity Goals: Patient/caregiver will verbalize understanding of skin care regimen Date Initiated: 12/29/2019 Target Resolution Date: 03/12/2020 Goal Status: Active Ulcer/skin breakdown will have a volume reduction of 30% by week 4 Date Initiated: 12/29/2019 Date Inactivated: 02/09/2020 Target Resolution Date: 01/30/2020 Goal Status: Met Interventions: Assess patient/caregiver ability to obtain necessary supplies Assess patient/caregiver ability to perform ulcer/skin care regimen upon admission and as needed Assess ulceration(s) every visit Provide education on ulcer and skin care Notes: Electronic Signature(s) Signed: 02/23/2020 5:33:33 PM By: Nathan Hurst RN, BSN Entered By: Nathan Miller on 02/23/2020 08:50:29 -------------------------------------------------------------------------------- Pain Assessment Details Patient Name: Date of Service: Nathan Miller 02/23/2020 9:15 AM Medical Record  [id number] Patient Account Number: [id number] Date of Birth/Sex: Treating RN: May 24, 1951 (69 y.o. Nathan Miller Primary Care Nathan Miller: Nathan Miller Other Clinician: Referring Cleophas Yoak: Treating Merril Isakson/Extender:Nathan Miller, Nathan Miller in Treatment: 8 Active Problems Location of Pain Severity and Description of Pain Patient Has Paino Miller Site Locations Pain Management and Medication Current Pain Management: Electronic Signature(s) Signed: 02/24/2020 5:26:58 PM By: Kela Millin Entered By: Kela Millin on 02/23/2020 08:51:38 -------------------------------------------------------------------------------- Patient/Caregiver Education Details Patient Name: Date of Service: Nathan Miller 4/26/2021andnbsp9:15 AM Medical Record [id number] Patient Account Number: [id number] Date of Birth/Gender: [birth year] (68 y.o. M) Treating RN: Nathan Miller Primary Care Physician: Nathan Miller Other Clinician: Referring Physician: Treating Physician/Extender:Nathan Miller, Nathan Miller in Treatment: 8 Education Assessment Education Provided To: Patient Education Topics Provided Wound/Skin Impairment: Methods: Explain/Verbal Responses: State content correctly Electronic Signature(s) Signed: 02/23/2020 5:33:33 PM By: Nathan Hurst RN, BSN Entered By: Nathan Miller on 02/23/2020 08:51:00 -------------------------------------------------------------------------------- Wound Assessment Details Patient Name: Date of Service: Nathan Miller, Nathan Miller 02/23/2020 9:15 AM Medical Record [id number] Patient Account Number: [id number] Date of Birth/Sex: Treating RN: 1951-04-03 (68 y.o. Nathan Miller Primary Care Nathan Miller: Nathan Miller Other Clinician: Referring Maddex Garlitz: Treating Haillee Johann/Extender:Nathan Miller, Nathan Miller in Treatment: 8 Wound Status Wound Number: 3 Primary Diabetic Wound/Ulcer of the Lower Extremity Wound Location:  Right, Plantar Calcaneus Etiology: Wounding Event: Blister Wound Open Date Acquired: 09/30/2019 Status: Weeks Of Treatment:8 ComorbidCoronary Artery Disease, Hypertension, Type II Clustered Wound: Miller History: Diabetes, Osteoarthritis, Osteomyelitis, Neuropathy, Confinement Anxiety Photos Photo Uploaded By: Nathan Miller on 02/24/2020  Nathan Miller, [name] ([id number]) Visit Report for 02/23/2020 Arrival Information Details Patient Name: Date of Service: Nathan Miller, Nathan Miller 02/23/2020 9:15 AM Medical Record [id number] Patient Account Number: [id number] Date of Birth/Sex: Treating RN: [birth year] (69 y.o. Nathan Miller Primary Care Orlie Cundari: Nathan Miller Other Clinician: Referring Tyreon Frigon: Treating Susumu Hackler/Extender:Nathan Miller, Nathan Miller in Treatment: 8 Visit Information History Since Last Visit Added or deleted any medications: Miller Patient Arrived: Nathan Miller 08:49 Any new allergies or adverse reactions: Miller Arrival Time: wife Had a fall or experienced change in Miller Accompanied By: None activities of daily living that may affect Transfer Assistance: Yes risk of falls: Patient Identification Verified: Yes Signs or symptoms of abuse/neglect since last visito Miller Secondary Verification Process Completed: Hospitalized since last visit: Miller Patient Requires Transmission-Based Precautions: Miller Implantable device outside of the clinic excluding Miller Patient Has Alerts: Miller cellular tissue based products placed in the center since last visit: Has Dressing in Place as Prescribed: Yes Pain Present Now: Miller Electronic Signature(s) Signed: 02/24/2020 5:26:58 PM By: Kela Millin Entered By: Kela Millin on 02/23/2020 08:50:33 -------------------------------------------------------------------------------- Encounter Discharge Information Details Patient Name: Date of Service: Nathan Eastern D. 02/23/2020 9:15 AM Medical Record [id number] Patient Account Number: [id number] Date of Birth/Sex: Treating RN: January 01, 1951 (69 y.o. Nathan Miller Primary Care Doniesha Landau: Nathan Miller Other Clinician: Referring Teshara Moree: Treating Tracie Dore/Extender:Nathan Miller, Nathan Miller in Treatment: 8 Encounter Discharge Information Items Post Procedure Vitals Discharge Condition: Stable Temperature (F):  97.5 Ambulatory Status: Cane Pulse (bpm): 76 Discharge Destination: Home Respiratory Rate (breaths/min): 20 Transportation: Private Auto Blood Pressure (mmHg): 187/87 Accompanied By: wife Schedule Follow-up Appointment: Yes Clinical Summary of Care: Patient Declined Electronic Signature(s) Signed: 02/24/2020 5:26:58 PM By: Kela Millin Entered By: Kela Millin on 02/23/2020 09:21:55 -------------------------------------------------------------------------------- Lower Extremity Assessment Details Patient Name: Date of Service: Nathan Miller, Nathan Miller 02/23/2020 9:15 AM Medical Record [id number] Patient Account Number: [id number] Date of Birth/Sex: Treating RN: 07/21/1951 (68 y.o. Nathan Miller Primary Care Armelia Penton: Nathan Miller Other Clinician: Referring Arham Symmonds: Treating Midge Momon/Extender:Nathan Miller, Nathan Miller in Treatment: 8 Edema Assessment Assessed: [Left: Miller] [Right: Miller] Edema: [Left: Ye] [Right: s] Calf Left: Right: Point of Measurement: cm From Medial Instep cm 41 cm Ankle Left: Right: Point of Measurement: cm From Medial Instep cm 28 cm Vascular Assessment Pulses: Dorsalis Pedis Palpable: [Right:Yes] Electronic Signature(s) Signed: 02/24/2020 5:26:58 PM By: Kela Millin Entered By: Kela Millin on 02/23/2020 08:52:46 -------------------------------------------------------------------------------- Multi Wound Chart Details Patient Name: Date of Service: Nathan Eastern D. 02/23/2020 9:15 AM Medical Record [id number] Patient Account Number: [id number] Date of Birth/Sex: Treating RN: July 13, 1951 (69 y.o. Nathan Miller Primary Care Mohid Furuya: Nathan Miller Other Clinician: Referring Camryn Quesinberry: Treating Taitum Menton/Extender:Nathan Miller, Nathan Miller in Treatment: 8 Vital Signs Height(in): 68 Pulse(bpm): 28 Weight(lbs): 253 Blood Pressure(mmHg):187/87 Body Mass Index(BMI): 38 Temperature(F):  97.5 Respiratory 20 Rate(breaths/min): Photos: [3:Miller Photos] [N/A:N/A] Wound Location: [3:Right, Plantar Calcaneus N/A] Wounding Event: [3:Blister] [N/A:N/A] Primary Etiology: [3:Diabetic Wound/Ulcer of the N/A Lower Extremity] Comorbid History: [3:Coronary Artery Disease, N/A Hypertension, Type II Diabetes, Osteoarthritis, Osteomyelitis, Neuropathy, Confinement Anxiety] Date Acquired: [3:09/30/2019] [N/A:N/A] Weeks of Treatment: [3:8] [N/A:N/A] Wound Status: [3:Open] [N/A:N/A] Measurements L x W x D 1.8x0.6x0.8 [N/A:N/A] (cm) Area (cm) : [3:0.848] [N/A:N/A] Volume (cm) : [3:0.679] [N/A:N/A] % Reduction in Area: [3:48.10%] [N/A:N/A] % Reduction in Volume: 30.70% [N/A:N/A] Starting Position 1 7 (o'clock): Ending Position 1 [3:12] (o'clock): Maximum Distance 1 [3:1.1] (cm): Undermining: [3:Yes] [N/A:N/A] Classification: [3:Grade 2] [N/A:N/A] Exudate Amount: [3:Medium] [N/A:N/A] Exudate Type: [3:Serosanguineous] [N/A:N/A] Exudate Color: [3:red,  brown] [N/A:N/A] Wound Margin: [3:Thickened] [N/A:N/A] Granulation Amount: [3:Large (67-100%)] [N/A:N/A] Granulation Quality: [3:Red, Pink] [N/A:N/A] Necrotic Amount: [3:None Present (0%)] [N/A:N/A] Exposed Structures: [3:Fat Layer (Subcutaneous N/A Tissue) Exposed: Yes Fascia: Miller Tendon: Miller Muscle: Miller Joint: Miller Bone: Miller] Epithelialization: [3:None] [N/A:N/A] Debridement: [3:Debridement - Excisional] [N/A:N/A] Pre-procedure [3:09:09] [N/A:N/A] Verification/Time Out Taken: Pain Control: [3:Lidocaine 5% topical ointment] [N/A:N/A] Tissue Debrided: [3:Callus, Subcutaneous] [N/A:N/A] Level: [3:Skin/Subcutaneous Tissue] [N/A:N/A] Debridement Area (sq cm):1.08 [N/A:N/A] Instrument: [3:Blade, Forceps] [N/A:N/A] Bleeding: [3:Moderate] [N/A:N/A] Hemostasis Achieved: [3:Silver Nitrate] [N/A:N/A] Procedural Pain: [3:0] [N/A:N/A] Post Procedural Pain: [3:0] [N/A:N/A] Debridement Treatment Procedure was tolerated [N/A:N/A] Response:  [3:well] Post Debridement [3:1.8x0.6x0.8] [N/A:N/A] Measurements L x W x D (cm) Post Debridement [3:0.679] [N/A:N/A] Volume: (cm) Procedures Performed: Debridement [N/A:N/A] Treatment Notes Electronic Signature(s) Signed: 02/23/2020 5:33:33 PM By: Nathan Hurst RN, BSN Signed: 02/23/2020 5:40:04 PM By: Nathan Ham MD Entered By: Nathan Miller on 02/23/2020 09:17:31 -------------------------------------------------------------------------------- Multi-Disciplinary Care Plan Details Patient Name: Date of Service: Nathan Miller, Nathan Miller 02/23/2020 9:15 AM Medical Record [id number] Patient Account Number: [id number] Date of Birth/Sex: Treating RN: [birth year] (69 y.o. Nathan Miller Primary Care Nathan Miller: Nathan Miller Other Clinician: Referring Nyshaun Standage: Treating Vela Render/Extender:Nathan Miller, Nathan Miller in Treatment: 8 Active Inactive Wound/Skin Impairment Nursing Diagnoses: Impaired tissue integrity Knowledge deficit related to ulceration/compromised skin integrity Goals: Patient/caregiver will verbalize understanding of skin care regimen Date Initiated: 12/29/2019 Target Resolution Date: 03/12/2020 Goal Status: Active Ulcer/skin breakdown will have a volume reduction of 30% by week 4 Date Initiated: 12/29/2019 Date Inactivated: 02/09/2020 Target Resolution Date: 01/30/2020 Goal Status: Met Interventions: Assess patient/caregiver ability to obtain necessary supplies Assess patient/caregiver ability to perform ulcer/skin care regimen upon admission and as needed Assess ulceration(s) every visit Provide education on ulcer and skin care Notes: Electronic Signature(s) Signed: 02/23/2020 5:33:33 PM By: Nathan Hurst RN, BSN Entered By: Nathan Miller on 02/23/2020 08:50:29 -------------------------------------------------------------------------------- Pain Assessment Details Patient Name: Date of Service: Nathan Miller 02/23/2020 9:15 AM Medical Record  [id number] Patient Account Number: [id number] Date of Birth/Sex: Treating RN: May 24, 1951 (69 y.o. Nathan Miller Primary Care Nathan Miller: Nathan Miller Other Clinician: Referring Cleophas Yoak: Treating Merril Isakson/Extender:Nathan Miller, Nathan Miller in Treatment: 8 Active Problems Location of Pain Severity and Description of Pain Patient Has Paino Miller Site Locations Pain Management and Medication Current Pain Management: Electronic Signature(s) Signed: 02/24/2020 5:26:58 PM By: Kela Millin Entered By: Kela Millin on 02/23/2020 08:51:38 -------------------------------------------------------------------------------- Patient/Caregiver Education Details Patient Name: Date of Service: Nathan Miller 4/26/2021andnbsp9:15 AM Medical Record [id number] Patient Account Number: [id number] Date of Birth/Gender: [birth year] (68 y.o. M) Treating RN: Nathan Miller Primary Care Physician: Nathan Miller Other Clinician: Referring Physician: Treating Physician/Extender:Nathan Miller, Nathan Miller in Treatment: 8 Education Assessment Education Provided To: Patient Education Topics Provided Wound/Skin Impairment: Methods: Explain/Verbal Responses: State content correctly Electronic Signature(s) Signed: 02/23/2020 5:33:33 PM By: Nathan Hurst RN, BSN Entered By: Nathan Miller on 02/23/2020 08:51:00 -------------------------------------------------------------------------------- Wound Assessment Details Patient Name: Date of Service: Nathan Miller, Nathan Miller 02/23/2020 9:15 AM Medical Record [id number] Patient Account Number: [id number] Date of Birth/Sex: Treating RN: 1951-04-03 (68 y.o. Nathan Miller Primary Care Nathan Miller: Nathan Miller Other Clinician: Referring Maddex Garlitz: Treating Haillee Johann/Extender:Nathan Miller, Nathan Miller in Treatment: 8 Wound Status Wound Number: 3 Primary Diabetic Wound/Ulcer of the Lower Extremity Wound Location:  Right, Plantar Calcaneus Etiology: Wounding Event: Blister Wound Open Date Acquired: 09/30/2019 Status: Weeks Of Treatment:8 ComorbidCoronary Artery Disease, Hypertension, Type II Clustered Wound: Miller History: Diabetes, Osteoarthritis, Osteomyelitis, Neuropathy, Confinement Anxiety Photos Photo Uploaded By: Nathan Miller on 02/24/2020

## 2020-03-09 ENCOUNTER — Encounter (HOSPITAL_BASED_OUTPATIENT_CLINIC_OR_DEPARTMENT_OTHER): Payer: PPO | Admitting: Internal Medicine

## 2020-03-16 ENCOUNTER — Encounter (HOSPITAL_BASED_OUTPATIENT_CLINIC_OR_DEPARTMENT_OTHER): Payer: PPO | Attending: Internal Medicine | Admitting: Internal Medicine

## 2020-03-16 ENCOUNTER — Other Ambulatory Visit: Payer: Self-pay

## 2020-03-16 DIAGNOSIS — M14671 Charcot's joint, right ankle and foot: Secondary | ICD-10-CM | POA: Diagnosis not present

## 2020-03-16 DIAGNOSIS — Z950 Presence of cardiac pacemaker: Secondary | ICD-10-CM | POA: Diagnosis not present

## 2020-03-16 DIAGNOSIS — L97512 Non-pressure chronic ulcer of other part of right foot with fat layer exposed: Secondary | ICD-10-CM | POA: Insufficient documentation

## 2020-03-16 DIAGNOSIS — R030 Elevated blood-pressure reading, without diagnosis of hypertension: Secondary | ICD-10-CM | POA: Insufficient documentation

## 2020-03-16 DIAGNOSIS — Z89421 Acquired absence of other right toe(s): Secondary | ICD-10-CM | POA: Diagnosis not present

## 2020-03-16 DIAGNOSIS — E11621 Type 2 diabetes mellitus with foot ulcer: Secondary | ICD-10-CM | POA: Diagnosis not present

## 2020-03-16 DIAGNOSIS — E1142 Type 2 diabetes mellitus with diabetic polyneuropathy: Secondary | ICD-10-CM | POA: Insufficient documentation

## 2020-03-16 DIAGNOSIS — L97412 Non-pressure chronic ulcer of right heel and midfoot with fat layer exposed: Secondary | ICD-10-CM | POA: Diagnosis not present

## 2020-03-16 NOTE — Progress Notes (Addendum)
AWAB, ABEBE ([id number]) Visit Report for 03/16/2020 Arrival Information Details Patient Name: Date of Service: Nathan Miller, [place] D. 03/16/2020 10:00 A M Medical Record Number: [id number] Patient Account Number: [id number] Date of Birth/Sex: Treating RN: Dec 12, 1950 (69 y.o. [name]) Carlene Coria Primary Care Phuong Moffatt: PA Haig Prophet, [place] Other Clinician: Referring Dovie Kapusta: Treating Carmel Garfield/Extender: Arta Silence in Treatment: 11 Visit Information History Since Last Visit Added or deleted any medications: No Patient Arrived: [name] Any new allergies or adverse reactions: No Arrival Time: 09:40 Had a fall or experienced change in No Accompanied By: self activities of daily living that may affect Transfer Assistance: None risk of falls: Patient Identification Verified: Yes Signs or symptoms of abuse/neglect since last visito No Secondary Verification Process Completed: Yes Hospitalized since last visit: No Patient Requires Transmission-Based Precautions: No Implantable device outside of the clinic excluding No Patient Has Alerts: No cellular tissue based products placed in the center since last visit: Has Dressing in Place as Prescribed: Yes Pain Present Now: No Electronic Signature(s) Signed: 03/16/2020 3:47:52 PM By: Deon Pilling Entered By: Deon Pilling on 03/16/2020 09:47:59 -------------------------------------------------------------------------------- Encounter Discharge Information Details Patient Name: Date of Service: Nathan Moll, [place] D. 03/16/2020 10:00 A M Medical Record Number: [id number] Patient Account Number: [id number] Date of Birth/Sex: Treating RN: [birth year] (69 y.o. [name] Primary Care Camyla Camposano: PA Haig Prophet, [place] Other Clinician: Referring Raileigh Sabater: Treating Kaitrin Seybold/Extender: Arta Silence in Treatment: 11 Encounter Discharge Information Items Post Procedure Vitals Discharge Condition: Stable Temperature (F):  98.3 Ambulatory Status: Cane Pulse (bpm): 72 Discharge Destination: Home Respiratory Rate (breaths/min): 18 Transportation: Private Auto Blood Pressure (mmHg): 178/77 Accompanied By: self Schedule Follow-up Appointment: Yes Clinical Summary of Care: Patient Declined Electronic Signature(s) Signed: 03/16/2020 5:47:21 PM By: Kela Millin Entered By: Kela Millin on 03/16/2020 11:20:44 -------------------------------------------------------------------------------- Lower Extremity Assessment Details Patient Name: Date of Service: Nathan Miller, [place] D. 03/16/2020 10:00 A M Medical Record Number: [id number] Patient Account Number: [id number] Date of Birth/Sex: Treating RN: [birth year] (69 y.o. [name]) Carlene Coria Primary Care Agnes Brightbill: PA Haig Prophet, [place] Other Clinician: Referring Natalya Domzalski: Treating Haylen Shelnutt/Extender: Arta Silence in Treatment: 11 Edema Assessment Assessed: Shirlyn Goltz: No] [Right: Yes] Edema: [Left: Ye] [Right: s] Calf Left: Right: Point of Measurement: cm From Medial Instep cm 41 cm Ankle Left: Right: Point of Measurement: cm From Medial Instep cm 27.5 cm Vascular Assessment Pulses: Dorsalis Pedis Palpable: [Right:Yes] Electronic Signature(s) Signed: 03/16/2020 3:47:52 PM By: Deon Pilling Signed: 03/16/2020 5:13:38 PM By: Carlene Coria RN Entered By: Deon Pilling on 03/16/2020 09:49:13 -------------------------------------------------------------------------------- Multi Wound Chart Details Patient Name: Date of Service: Nathan Moll, [place] D. 03/16/2020 10:00 A M Medical Record Number: [id number] Patient Account Number: [id number] Date of Birth/Sex: Treating RN: 14-Feb-1951 (69 y.o. [name]) Carlene Coria Primary Care Rhyann Berton: PA Haig Prophet, [place] Other Clinician: Referring Lindalou Soltis: Treating Kazimierz Springborn/Extender: Arta Silence in Treatment: 11 Vital Signs Height(in): 68 Capillary Blood Glucose(mg/dl): 119 Weight(lbs):  253 Pulse(bpm): 25 Body Mass Index(BMI): 54 Blood Pressure(mmHg): 178/77 Temperature(F): 98.3 Respiratory Rate(breaths/min): 18 Photos: [3:No Photos Right, Plantar Calcaneus] [N/A:N/A N/A] Wound Location: [3:Blister] [N/A:N/A] Wounding Event: [3:Diabetic Wound/Ulcer of the Lower] [N/A:N/A] Primary Etiology: [3:Extremity Coronary Artery Disease,] [N/A:N/A] Comorbid History: [3:Hypertension, Type II Diabetes, Osteoarthritis, Osteomyelitis, Neuropathy, Confinement Anxiety 09/30/2019] [N/A:N/A] Date Acquired: [3:11] [N/A:N/A] Weeks of Treatment: [3:Open] [N/A:N/A] Wound Status: [3:2x1x0.2] [N/A:N/A] Measurements L x W x D (cm) [3:1.571] [N/A:N/A] A (cm) : rea [3:0.314] [N/A:N/A] Volume (cm) : [3:3.90%] [N/A:N/A] % Reduction in A rea: [  3:68.00%] [N/A:N/A] % Reduction in Volume: [3:Grade 2] [N/A:N/A] Classification: [3:Medium] [N/A:N/A] Exudate A mount: [3:Serosanguineous] [N/A:N/A] Exudate Type: [3:red, brown] [N/A:N/A] Exudate Color: [3:Thickened] [N/A:N/A] Wound Margin: [3:Large (67-100%)] [N/A:N/A] Granulation A mount: [3:Red, Pink] [N/A:N/A] Granulation Quality: [3:None Present (0%)] [N/A:N/A] Necrotic A mount: [3:Fat Layer (Subcutaneous Tissue)] [N/A:N/A] Exposed Structures: [3:Exposed: Yes Fascia: No Tendon: No Muscle: No Joint: No Bone: No None] [N/A:N/A] Epithelialization: [3:Debridement - Excisional] [N/A:N/A] Debridement: Pre-procedure Verification/Time Out 11:01 [N/A:N/A] Taken: [3:Lidocaine 5% topical ointment] [N/A:N/A] Pain Control: [3:Callus, Subcutaneous, Slough] [N/A:N/A] Tissue Debrided: [3:Skin/Subcutaneous Tissue] [N/A:N/A] Level: [3:2] [N/A:N/A] Debridement A (sq cm): [3:rea Curette] [N/A:N/A] Instrument: [3:Moderate] [N/A:N/A] Bleeding: [3:Pressure] [N/A:N/A] Hemostasis A chieved: [3:0] [N/A:N/A] Procedural Pain: [3:0] [N/A:N/A] Post Procedural Pain: [3:Procedure was tolerated well] [N/A:N/A] Debridement Treatment Response: [3:2x1x0.2] [N/A:N/A] Post  Debridement Measurements L x W x D (cm) [3:0.314] [N/A:N/A] Post Debridement Volume: (cm) [3:callus to periwound.] [N/A:N/A] Assessment Notes: [3:Debridement] [N/A:N/A] Treatment Notes Wound #3 (Right, Plantar Calcaneus) 1. Cleanse With Wound Cleanser 2. Periwound Care Skin Prep 3. Primary Dressing Applied Calcium Alginate Ag 4. Secondary Dressing Dry Gauze Roll Gauze 5. Secured With Tape 6. Support Layer Psychologist, forensic Notes patient refuses foam Barrister's clerk) Signed: 03/18/2020 12:52:55 PM By: Linton Ham MD Signed: 03/19/2020 9:36:08 AM By: Carlene Coria RN Entered By: Linton Ham on 03/17/2020 07:54:51 -------------------------------------------------------------------------------- Multi-Disciplinary Care Plan Details Patient Name: Date of Service: Nathan Miller, [place] D. 03/16/2020 10:00 A M Medical Record Number: [id number] Patient Account Number: [id number] Date of Birth/Sex: Treating RN: [birth year] (68 y.o. [name]) Carlene Coria Primary Care Elisabetta Mishra: PA Haig Prophet, [place] Other Clinician: Referring Taeshawn Helfman: Treating Siaosi Alter/Extender: Arta Silence in Treatment: 11 Active Inactive Wound/Skin Impairment Nursing Diagnoses: Impaired tissue integrity Knowledge deficit related to ulceration/compromised skin integrity Goals: Patient/caregiver will verbalize understanding of skin care regimen Date Initiated: 12/29/2019 Target Resolution Date: 04/16/2020 Goal Status: Active Ulcer/skin breakdown will have a volume reduction of 30% by week 4 Date Initiated: 12/29/2019 Date Inactivated: 02/09/2020 Target Resolution Date: 01/30/2020 Goal Status: Met Interventions: Assess patient/caregiver ability to obtain necessary supplies Assess patient/caregiver ability to perform ulcer/skin care regimen upon admission and as needed Assess ulceration(s) every visit Provide education on ulcer and skin care Notes: Electronic Signature(s) Signed: 03/16/2020  5:13:38 PM By: Carlene Coria RN Entered By: Carlene Coria on 03/16/2020 09:37:38 -------------------------------------------------------------------------------- Pain Assessment Details Patient Name: Date of Service: Nathan Miller, [place] D. 03/16/2020 10:00 A M Medical Record Number: [id number] Patient Account Number: [id number] Date of Birth/Sex: Treating RN: 1951/09/16 (68 y.o. [name]) Carlene Coria Primary Care Mykal Batiz: PA Haig Prophet, [place] Other Clinician: Referring Nino Amano: Treating Asyah Candler/Extender: Arta Silence in Treatment: 11 Active Problems Location of Pain Severity and Description of Pain Patient Has Paino No Site Locations Rate the pain. Rate the pain. Current Pain Level: 0 Pain Management and Medication Current Pain Management: Medication: No Cold Application: No Rest: No Massage: No Activity: No T.E.N.S.: No Heat Application: No Leg drop or elevation: No Is the Current Pain Management Adequate: Adequate How does your wound impact your activities of daily livingo Sleep: No Bathing: No Appetite: No Relationship With Others: No Bladder Continence: No Emotions: No Bowel Continence: No Work: No Toileting: No Drive: No Dressing: No Hobbies: No Electronic Signature(s) Signed: 03/16/2020 3:47:52 PM By: Deon Pilling Signed: 03/16/2020 5:13:38 PM By: Carlene Coria RN Entered By: Deon Pilling on 03/16/2020 09:49:03 -------------------------------------------------------------------------------- Patient/Caregiver Education Details Patient Name: Date of Service: Nathan Moll, [place] D. 5/18/2021andnbsp10:00 A M Medical Record Number: [id number] Patient Account  Number: [id number] Date of Birth/Gender: Treating RN: 05-Feb-1951 (68 y.o. [name]) Carlene Coria Primary Care Physician: PA Haig Prophet, [place] Other Clinician: Referring Physician: Treating Physician/Extender: Arta Silence in Treatment: 11 Education Assessment Education Provided  To: Patient Education Topics Provided Wound/Skin Impairment: Methods: Explain/Verbal Responses: State content correctly [employer]) Signed: 03/16/2020 5:13:38 PM By: Carlene Coria RN Entered By: Carlene Coria on 03/16/2020 09:37:54 -------------------------------------------------------------------------------- Wound Assessment Details Patient Name: Date of Service: Nathan Miller, [place] D. 03/16/2020 10:00 A M Medical Record Number: [id number] Patient Account Number: [id number] Date of Birth/Sex: Treating RN: 1951-07-28 (68 y.o. [name]) Carlene Coria Primary Care Ceilidh Torregrossa: PA Haig Prophet, [place] Other Clinician: Referring Zendayah Hardgrave: Treating Nari Vannatter/Extender: Arta Silence in Treatment: 11 Wound Status Wound Number: 3 Primary Diabetic Wound/Ulcer of the Lower Extremity Etiology: Wound Location: Right, Plantar Calcaneus Wound Open Wounding Event: Blister Status: Date Acquired: 09/30/2019 Comorbid Coronary Artery Disease, Hypertension, Type II Diabetes, Weeks Of Treatment: 11 History: Osteoarthritis, Osteomyelitis, Neuropathy, Confinement Anxiety Clustered Wound: No Wound Measurements Length: (cm) 2 % Width: (cm) 1 % Depth: (cm) 0.2 E Area: (cm) 1.571 Volume: (cm) 0.314 Reduction in Area: 3.9% Reduction in Volume: 68% pithelialization: None Tunneling: No Undermining: No Wound Description Classification: Grade 2 Wound Margin: Thickened Exudate Amount: Medium Exudate Type: Serosanguineous Exudate Color: red, brown Foul Odor After Cleansing: No Slough/Fibrino No Wound Bed Granulation Amount: Large (67-100%) Exposed Structure Granulation Quality: Red, Pink Fascia Exposed: No Necrotic Amount: None Present (0%) Fat Layer (Subcutaneous Tissue) Exposed: Yes Tendon Exposed: No Muscle Exposed: No Joint Exposed: No Bone Exposed: No Assessment Notes callus to periwound. Treatment Notes Wound #3 (Right, Plantar Calcaneus) 1. Cleanse With Wound Cleanser 2.  Periwound Care Skin Prep 3. Primary Dressing Applied Calcium Alginate Ag 4. Secondary Dressing Dry Gauze Roll Gauze 5. Secured With Tape 6. Support Layer Psychologist, forensic Notes patient refuses foam donut Electronic Signature(s) Signed: 03/16/2020 3:47:52 PM By: Deon Pilling Signed: 03/16/2020 5:13:38 PM By: Carlene Coria RN Entered By: Deon Pilling on 03/16/2020 09:49:55 -------------------------------------------------------------------------------- Vitals Details Patient Name: Date of Service: Nathan Moll, [place] D. 03/16/2020 10:00 A M Medical Record Number: [id number] Patient Account Number: [id number] Date of Birth/Sex: Treating RN: 1950/11/20 (68 y.o. [name]) Carlene Coria Primary Care Leshia Kope: PA Haig Prophet, [place] Other Clinician: Referring Atara Paterson: Treating Cayden Rautio/Extender: Arta Silence in Treatment: 11 Vital Signs Time Taken: 09:41 Temperature (F): 98.3 Height (in): 68 Pulse (bpm): 72 Weight (lbs): 253 Respiratory Rate (breaths/min): 18 Body Mass Index (BMI): 38.5 Blood Pressure (mmHg): 178/77 Capillary Blood Glucose (mg/dl): 119 Reference Range: 80 - 120 mg / dl Notes educated patient on elevated BP, the importance of closely monitoring it and keeping it lower. MD and case manager made aware. Electronic Signature(s) Signed: 03/16/2020 3:47:52 PM By: Deon Pilling Entered By: Deon Pilling on 03/16/2020 09:48:54

## 2020-03-19 NOTE — Progress Notes (Signed)
period of time but he could not really tolerate this. In any case this is totally closed. That outcome in itself is really quite amazing READMISSION 12/29/2019 [name] is a now 69 year old man who is in this clinic many years ago for osteomyelitis and a wound on his right lateral foot. 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 He has a Charcot foot deformity. He had extensive orthopedic surgery on this foot as well removing under the underlying metatarsals that at the time had osteomyelitis. We followed him for a long period palliatively for a deep wound on the right lateral foot. He had refused an amputation probably 8 or 9 years ago and underwent IV antibiotics and hyperbaric oxygen. Miraculously this wound actually healed over I believe in 2019. The patient has a wound on the right lateral plantar heel which is not in the same spot as the previous wound. This is a weightbearing surface. The wound looks fairly benign but there is significant undermining. No erythema. They state that is started as a blister they used leftover silver alginate, Neosporin and trying to offload this as best he can. He has custom-made shoes with an AFO brace on the right. Past medical history is reviewed  predominantly coronary artery disease, pacemaker, stent, type 2 diabetes with a recent hemoglobin A1c of 6.1. ABI in our clinic was 1.0 on the right 3/22; areas on the right lateral heel not any better than last time. He has some undermining medially. This is undoubtedly a weightbearing surface here. This will not be easy to heal. 3/29; this is a patient with type 2 diabetes and her Charcot foot. He has an area on the plantar heel on the right. We have been using silver alginate. He is his own special boot with a brace. 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 Areas on the plantar heel on the right. Improvement in depth. We have been using silver alginate 4/26 Charcot foot. Right plantar heel lateral aspect. No real improvement today. We have been using silver alginate because of drainage. This is the reason he does not want a total contact cast he says the dressing simply needs to be changed daily. He has a new custom shoe with an AFO brace on the right. He states the wound started in his old shoes 5/18; Charcot foot previous foot surgery and probably some degree of localized lymphedema in the right lateral foot. He has specialized shoes with an AFO on this side. There is not an option to offload this wound which is on the right plantar heel lateral aspect. We have been using silver alginate. The wound is somewhat smaller. He does not have an arterial issue Electronic Signature(s) Signed: 03/18/2020 12:52:55 PM By: Linton Ham MD Entered By: Linton Ham on 03/17/2020 07:56:34 -------------------------------------------------------------------------------- Physical Exam Details Patient Name: Date of Service: Nathan Moll, PA UL D. 03/16/2020 10:00 A M Medical Record Number: [id number] Patient Account Number: [id number] Date of Birth/Sex: Treating RN: 08-15-1951 (69 y.o. Oval Linsey Primary Care Provider: PA Haig Prophet, NO Other Clinician: Referring Provider: Treating Provider/Extender:  Arta Silence in Treatment: 11 Constitutional Patient is hypertensive.. Pulse regular and within target range for patient.[name] Respirations regular, non-labored and within target range.. Temperature is normal and within the target range for the patient.[name] Appears in no distress. Cardiovascular Pedal pulses on the right are palpable. Notes Wound exam; area questions  that dressing is changed Primary Wound Dressing: Wound #3 Right,Plantar Calcaneus: Calcium Alginate with Silver Secondary Dressing: Wound #3 Right,Plantar Calcaneus: Foam - foam donut Kerlix/Rolled Gauze - secure with tape Dry Gauze Other: - ace wrap over dressing Off-Loading: Turn and reposition every 2 hours Other: - float heels off of bed/chair with pillow under calves 1. The wound looks somewhat better. By my view of this also smaller in terms of surface area there is no depth 2. Continued with silver alginate, gauze and I have asked the nurses to show him how to Ace wrap this area. I would like to get some control of the swelling if that is possible. I do not believe he would agree to a compression wrap and weekly visits. He would not agree to a total contact cast or even an attempt at it because of drainage and the frequency of visits that would entail 3. Follow-up in 2 weeks Electronic Signature(s) Signed: 03/18/2020 12:52:55 PM By: Linton Ham MD Entered By: Linton Ham on 03/17/2020 07:59:23 -------------------------------------------------------------------------------- SuperBill Details Patient  Name: Date of Service: Nathan Miller, [place] UL D. 03/16/2020 Medical Record Number: [id number] Patient Account Number: [id number] Date of Birth/Sex: Treating RN: 02-Sep-1951 (69 y.o. Nathan Miller) Carlene Coria Primary Care Provider: PA Haig Prophet, NO Other Clinician: Referring Provider: Treating Provider/Extender: Arta Silence in Treatment: 11 Diagnosis Coding ICD-10 Codes Code Description E11.621 Type 2 diabetes mellitus with foot ulcer 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 of other part of right foot with fat layer exposed E11.42 Type 2 diabetes mellitus with diabetic polyneuropathy M14.671 Charcot's joint, right ankle and foot Facility Procedures CPT4 Code: [id number] Description: [id number] - DEB SUBQ TISSUE 20 SQ CM/< ICD-10 Diagnosis Description E11.621 Type 2 diabetes mellitus with foot ulcer 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 of other part of right foot with fat layer exposed Modifier: Quantity: 1 Physician Procedures : CPT4 Code Description Modifier [id number] - WC PHYS SUBQ TISS 20 SQ CM ICD-10 Diagnosis Description E11.621 Type 2 diabetes mellitus with foot ulcer 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 of other part of right foot with fat layer exposed Quantity: 1 Electronic Signature(s) Signed: 03/18/2020 12:52:55 PM By: Linton Ham MD Entered By: Linton Ham on 03/17/2020 07:59:45  CAYCEN, NODAL ([id number]) Visit Report for 03/16/2020 Debridement Details Patient Name: Date of Service: Nathan Miller, [place] UL D. 03/16/2020 10:00 A M Medical Record Number: [id number] Patient Account Number: [id number] Date of Birth/Sex: Treating RN: 06-Nov-1950 (68 y.o. Nathan Miller) Carlene Coria Primary Care Provider: PA Haig Prophet, [place] Other Clinician: Referring Provider: Treating Provider/Extender: Arta Silence in Treatment: 11 Debridement Performed for Assessment: Wound #3 Right,Plantar Calcaneus Performed By: Physician Ricard Dillon., MD Debridement Type: Debridement Severity of Tissue Pre Debridement: Fat layer exposed Level of Consciousness (Pre-procedure): Awake and Alert Pre-procedure Verification/Time Out Yes - 11:01 Taken: Start Time: 11:01 Pain Control: Lidocaine 5% topical ointment T Area Debrided (L x W): otal 2 (cm) x 1 (cm) = 2 (cm) Tissue and other material debrided: Viable, Non-Viable, Callus, Slough, Subcutaneous, Skin: Dermis , Skin: Epidermis, Slough Level: Skin/Subcutaneous Tissue Debridement Description: Excisional Instrument: Curette Bleeding: Moderate Hemostasis Achieved: Pressure End Time: 11:03 Procedural Pain: 0 Post Procedural Pain: 0 Response to Treatment: Procedure was tolerated well Level of Consciousness (Post- Awake and Alert procedure): Post Debridement Measurements of Total Wound Length: (cm) 2 Width: (cm) 1 Depth: (cm) 0.2 Volume: (cm) 0.314 Character of Wound/Ulcer Post Debridement: Improved Severity of Tissue Post Debridement: Fat layer exposed Post Procedure Diagnosis Same as Pre-procedure Electronic Signature(s) Signed: 03/18/2020 12:52:55 PM By: Linton Ham MD Signed: 03/19/2020 9:36:08 AM By: Carlene Coria RN Previous Signature: 03/16/2020 5:13:38 PM Version By: Carlene Coria RN Entered By: Linton Ham on 03/17/2020 07:55:37 -------------------------------------------------------------------------------- HPI  Details Patient Name: Date of Service: Nathan Moll, PA UL D. 03/16/2020 10:00 A M Medical Record Number: [id number] Patient Account Number: [id number] Date of Birth/Sex: Treating RN: Mar 14, 1951 (68 y.o. Oval Linsey Primary Care Provider: PA Haig Prophet, NO Other Clinician: Referring Provider: Treating Provider/Extender: Arta Silence in Treatment: 11 History of Present Illness Location: right foot Severity: Wag 3 Duration: #months Modifying Factors: Diabetic and charcot' foot.. Peripheral vascular disease ssociated Signs and Symptoms: osteo A HPI Description: see chief complaint. 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 This is been present for 5-6 years. He underwent a course of IV any biotics and hyperbaric oxygen roughly 5 years ago. He was offered an amputation by orthopedics and I believe at the time I concurred with this however the patient wishes to up go via wait-and-see approach. He is actually done fairly well over the 5 years is still ambulatory. He uses a silver alginate-based dressing on the foot which he changes himself up. 05/27/15; patient returns for his monthly visit the. The status of his foot and his wound are essentially unchanged this patient has a chronic idiopathic neuropathy. He has underlying osteomyelitis. He had extensive orthopedic surgery roughly 3 or 4 years ago had. He has underlying chronic osteomyelitis.[name] He dresses this with silver alginate. We order the supplies. I continue to see him on a monthly basis for wound debridement. 06/24/15 the patient arrives here today for his monthly wound care check. He has underlying chronic osteomyelitis. The wound was surgically debridement of nonviable circumferential tissue. He is continued with silver alginate based stressing severe this area changed daily by his wife. Post debridement this  actually looks as good as I've seen this since quite some time 07/29/15. The patient has been coming here monthly for many years. He has a chronic wound on his right lateral foot at roughly the mid shaft of his fifth metatarsal.  that dressing is changed Primary Wound Dressing: Wound #3 Right,Plantar Calcaneus: Calcium Alginate with Silver Secondary Dressing: Wound #3 Right,Plantar Calcaneus: Foam - foam donut Kerlix/Rolled Gauze - secure with tape Dry Gauze Other: - ace wrap over dressing Off-Loading: Turn and reposition every 2 hours Other: - float heels off of bed/chair with pillow under calves 1. The wound looks somewhat better. By my view of this also smaller in terms of surface area there is no depth 2. Continued with silver alginate, gauze and I have asked the nurses to show him how to Ace wrap this area. I would like to get some control of the swelling if that is possible. I do not believe he would agree to a compression wrap and weekly visits. He would not agree to a total contact cast or even an attempt at it because of drainage and the frequency of visits that would entail 3. Follow-up in 2 weeks Electronic Signature(s) Signed: 03/18/2020 12:52:55 PM By: Linton Ham MD Entered By: Linton Ham on 03/17/2020 07:59:23 -------------------------------------------------------------------------------- SuperBill Details Patient  Name: Date of Service: Nathan Miller, [place] UL D. 03/16/2020 Medical Record Number: [id number] Patient Account Number: [id number] Date of Birth/Sex: Treating RN: 02-Sep-1951 (69 y.o. Nathan Miller) Carlene Coria Primary Care Provider: PA Haig Prophet, NO Other Clinician: Referring Provider: Treating Provider/Extender: Arta Silence in Treatment: 11 Diagnosis Coding ICD-10 Codes Code Description E11.621 Type 2 diabetes mellitus with foot ulcer 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 of other part of right foot with fat layer exposed E11.42 Type 2 diabetes mellitus with diabetic polyneuropathy M14.671 Charcot's joint, right ankle and foot Facility Procedures CPT4 Code: [id number] Description: [id number] - DEB SUBQ TISSUE 20 SQ CM/< ICD-10 Diagnosis Description E11.621 Type 2 diabetes mellitus with foot ulcer 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 of other part of right foot with fat layer exposed Modifier: Quantity: 1 Physician Procedures : CPT4 Code Description Modifier [id number] - WC PHYS SUBQ TISS 20 SQ CM ICD-10 Diagnosis Description E11.621 Type 2 diabetes mellitus with foot ulcer 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 of other part of right foot with fat layer exposed Quantity: 1 Electronic Signature(s) Signed: 03/18/2020 12:52:55 PM By: Linton Ham MD Entered By: Linton Ham on 03/17/2020 07:59:45  period of time but he could not really tolerate this. In any case this is totally closed. That outcome in itself is really quite amazing READMISSION 12/29/2019 [name] is a now 69 year old man who is in this clinic many years ago for osteomyelitis and a wound on his right lateral foot. 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 He has a Charcot foot deformity. He had extensive orthopedic surgery on this foot as well removing under the underlying metatarsals that at the time had osteomyelitis. We followed him for a long period palliatively for a deep wound on the right lateral foot. He had refused an amputation probably 8 or 9 years ago and underwent IV antibiotics and hyperbaric oxygen. Miraculously this wound actually healed over I believe in 2019. The patient has a wound on the right lateral plantar heel which is not in the same spot as the previous wound. This is a weightbearing surface. The wound looks fairly benign but there is significant undermining. No erythema. They state that is started as a blister they used leftover silver alginate, Neosporin and trying to offload this as best he can. He has custom-made shoes with an AFO brace on the right. Past medical history is reviewed  predominantly coronary artery disease, pacemaker, stent, type 2 diabetes with a recent hemoglobin A1c of 6.1. ABI in our clinic was 1.0 on the right 3/22; areas on the right lateral heel not any better than last time. He has some undermining medially. This is undoubtedly a weightbearing surface here. This will not be easy to heal. 3/29; this is a patient with type 2 diabetes and her Charcot foot. He has an area on the plantar heel on the right. We have been using silver alginate. He is his own special boot with a brace. 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 Areas on the plantar heel on the right. Improvement in depth. We have been using silver alginate 4/26 Charcot foot. Right plantar heel lateral aspect. No real improvement today. We have been using silver alginate because of drainage. This is the reason he does not want a total contact cast he says the dressing simply needs to be changed daily. He has a new custom shoe with an AFO brace on the right. He states the wound started in his old shoes 5/18; Charcot foot previous foot surgery and probably some degree of localized lymphedema in the right lateral foot. He has specialized shoes with an AFO on this side. There is not an option to offload this wound which is on the right plantar heel lateral aspect. We have been using silver alginate. The wound is somewhat smaller. He does not have an arterial issue Electronic Signature(s) Signed: 03/18/2020 12:52:55 PM By: Linton Ham MD Entered By: Linton Ham on 03/17/2020 07:56:34 -------------------------------------------------------------------------------- Physical Exam Details Patient Name: Date of Service: Nathan Moll, PA UL D. 03/16/2020 10:00 A M Medical Record Number: [id number] Patient Account Number: [id number] Date of Birth/Sex: Treating RN: 08-15-1951 (69 y.o. Oval Linsey Primary Care Provider: PA Haig Prophet, NO Other Clinician: Referring Provider: Treating Provider/Extender:  Arta Silence in Treatment: 11 Constitutional Patient is hypertensive.. Pulse regular and within target range for patient.[name] Respirations regular, non-labored and within target range.. Temperature is normal and within the target range for the patient.[name] Appears in no distress. Cardiovascular Pedal pulses on the right are palpable. Notes Wound exam; area questions  on the right lateral heel. Perhaps slightly smaller. Debrided of some fibrinous surface debris and some nonviable surrounding skin. Hemostasis with direct pressure. There is still a lot of swelling around this area. Healing is complicated by the fact that this is likely a weightbearing surface the way he walks. His previous wound was also on this side of the foot. No evidence of infection Electronic Signature(s) Signed: 03/18/2020 12:52:55 PM By: Linton Ham MD Entered By: Linton Ham on 03/17/2020 07:58:00 -------------------------------------------------------------------------------- Physician Orders Details Patient Name: Date of Service: Nathan Miller, [place] UL D. 03/16/2020 10:00 A M Medical Record Number: [id number] Patient Account Number: [id number] Date of Birth/Sex: Treating RN: 07-Mar-1951 (68 y.o. Nathan Miller) Carlene Coria Primary Care Provider: PA Haig Prophet, [place] Other Clinician: Referring Provider: Treating Provider/Extender: Arta Silence in Treatment: 11 Verbal / Phone Orders: No Diagnosis Coding ICD-10 Coding Code Description E11.621 Type 2 diabetes mellitus with foot ulcer 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 of other part of right foot with fat layer exposed E11.42 Type 2 diabetes mellitus with diabetic polyneuropathy M14.671 Charcot's joint, right ankle and foot Follow-up Appointments Return Appointment in 2 weeks. Dressing Change Frequency Wound #3 Right,Plantar Calcaneus Change dressing every day. Wound Cleansing Wound #3 Right,Plantar Calcaneus May shower and wash wound with  soap and water. - on days that dressing is changed Primary Wound Dressing Wound #3 Right,Plantar Calcaneus Calcium Alginate with Silver Secondary Dressing Wound #3 Right,Plantar Calcaneus Foam - foam donut Kerlix/Rolled Gauze - secure with tape Dry Gauze Other: - ace wrap over dressing Off-Loading Turn and reposition every 2 hours Other: - float heels off of bed/chair with pillow under calves Electronic Signature(s) Signed: 03/16/2020 5:13:38 PM By: Carlene Coria RN Signed: 03/18/2020 12:52:55 PM By: Linton Ham MD Signed: 03/18/2020 12:52:55 PM By: Linton Ham MD Entered By: Carlene Coria on 03/16/2020 11:05:28 -------------------------------------------------------------------------------- Problem List Details Patient Name: Date of Service: Nathan Moll, PA UL D. 03/16/2020 10:00 A M Medical Record Number: [id number] Patient Account Number: [id number] Date of Birth/Sex: Treating RN: 09-22-1951 (68 y.o. Oval Linsey Primary Care Provider: PA Haig Prophet, [place] Other Clinician: Referring Provider: Treating Provider/Extender: Arta Silence in Treatment: 11 Active Problems ICD-10 Encounter Code Description Active Date MDM Diagnosis E11.621 Type 2 diabetes mellitus with foot ulcer 12/29/2019 No Yes 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 of other part of right foot with fat layer exposed 12/29/2019 No Yes E11.42 Type 2 diabetes mellitus with diabetic polyneuropathy 12/29/2019 No Yes M14.671 Charcot's joint, right ankle and foot 12/29/2019 No Yes Inactive Problems Resolved Problems Electronic Signature(s) Signed: 03/18/2020 12:52:55 PM By: Linton Ham MD Previous Signature: 03/16/2020 5:13:38 PM Version By: Carlene Coria RN Entered By: Linton Ham on 03/17/2020 07:54:33 -------------------------------------------------------------------------------- Progress Note Details Patient Name: Date of Service: Nathan Moll, PA UL D. 03/16/2020 10:00 A M Medical Record Number:  [id number] Patient Account Number: [id number] Date of Birth/Sex: Treating RN: [birth year] (68 y.o. Nathan Miller) Carlene Coria Primary Care Provider: PA Haig Prophet, NO Other Clinician: Referring Provider: Treating Provider/Extender: Arta Silence in Treatment: 11 Subjective History of Present Illness (HPI) The following HPI elements were documented for the patient's wound: Location: right foot Severity: Wag 3 Duration: #months Modifying Factors: Diabetic and charcot' foot.. Peripheral vascular disease Associated Signs and Symptoms: osteo see chief complaint. 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 This is been present for 5-6 years. He underwent a course of IV any biotics and  on the right lateral heel. Perhaps slightly smaller. Debrided of some fibrinous surface debris and some nonviable surrounding skin. Hemostasis with direct pressure. There is still a lot of swelling around this area. Healing is complicated by the fact that this is likely a weightbearing surface the way he walks. His previous wound was also on this side of the foot. No evidence of infection Electronic Signature(s) Signed: 03/18/2020 12:52:55 PM By: Linton Ham MD Entered By: Linton Ham on 03/17/2020 07:58:00 -------------------------------------------------------------------------------- Physician Orders Details Patient Name: Date of Service: Nathan Miller, [place] UL D. 03/16/2020 10:00 A M Medical Record Number: [id number] Patient Account Number: [id number] Date of Birth/Sex: Treating RN: 07-Mar-1951 (68 y.o. Nathan Miller) Carlene Coria Primary Care Provider: PA Haig Prophet, [place] Other Clinician: Referring Provider: Treating Provider/Extender: Arta Silence in Treatment: 11 Verbal / Phone Orders: No Diagnosis Coding ICD-10 Coding Code Description E11.621 Type 2 diabetes mellitus with foot ulcer 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 of other part of right foot with fat layer exposed E11.42 Type 2 diabetes mellitus with diabetic polyneuropathy M14.671 Charcot's joint, right ankle and foot Follow-up Appointments Return Appointment in 2 weeks. Dressing Change Frequency Wound #3 Right,Plantar Calcaneus Change dressing every day. Wound Cleansing Wound #3 Right,Plantar Calcaneus May shower and wash wound with  soap and water. - on days that dressing is changed Primary Wound Dressing Wound #3 Right,Plantar Calcaneus Calcium Alginate with Silver Secondary Dressing Wound #3 Right,Plantar Calcaneus Foam - foam donut Kerlix/Rolled Gauze - secure with tape Dry Gauze Other: - ace wrap over dressing Off-Loading Turn and reposition every 2 hours Other: - float heels off of bed/chair with pillow under calves Electronic Signature(s) Signed: 03/16/2020 5:13:38 PM By: Carlene Coria RN Signed: 03/18/2020 12:52:55 PM By: Linton Ham MD Signed: 03/18/2020 12:52:55 PM By: Linton Ham MD Entered By: Carlene Coria on 03/16/2020 11:05:28 -------------------------------------------------------------------------------- Problem List Details Patient Name: Date of Service: Nathan Moll, [place] UL D. 03/16/2020 10:00 A M Medical Record Number: [id number] Patient Account Number: [id number] Date of Birth/Sex: Treating RN: 09-22-1951 (68 y.o. Oval Linsey Primary Care Provider: PA Haig Prophet, [place] Other Clinician: Referring Provider: Treating Provider/Extender: Arta Silence in Treatment: 11 Active Problems ICD-10 Encounter Code Description Active Date MDM Diagnosis E11.621 Type 2 diabetes mellitus with foot ulcer 12/29/2019 No Yes 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 of other part of right foot with fat layer exposed 12/29/2019 No Yes E11.42 Type 2 diabetes mellitus with diabetic polyneuropathy 12/29/2019 No Yes M14.671 Charcot's joint, right ankle and foot 12/29/2019 No Yes Inactive Problems Resolved Problems Electronic Signature(s) Signed: 03/18/2020 12:52:55 PM By: Linton Ham MD Previous Signature: 03/16/2020 5:13:38 PM Version By: Carlene Coria RN Entered By: Linton Ham on 03/17/2020 07:54:33 -------------------------------------------------------------------------------- Progress Note Details Patient Name: Date of Service: Nathan Moll, [place] UL D. 03/16/2020 10:00 A M Medical Record Number:  [id number] Patient Account Number: [id number] Date of Birth/Sex: Treating RN: [birth year] (68 y.o. Nathan Miller) Carlene Coria Primary Care Provider: PA Haig Prophet, NO Other Clinician: Referring Provider: Treating Provider/Extender: Arta Silence in Treatment: 11 Subjective History of Present Illness (HPI) The following HPI elements were documented for the patient's wound: Location: right foot Severity: Wag 3 Duration: #months Modifying Factors: Diabetic and charcot' foot.. Peripheral vascular disease Associated Signs and Symptoms: osteo see chief complaint. 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 This is been present for 5-6 years. He underwent a course of IV any biotics and  period of time but he could not really tolerate this. In any case this is totally closed. That outcome in itself is really quite amazing READMISSION 12/29/2019 [name] is a now 69 year old man who is in this clinic many years ago for osteomyelitis and a wound on his right lateral foot. 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 He has a Charcot foot deformity. He had extensive orthopedic surgery on this foot as well removing under the underlying metatarsals that at the time had osteomyelitis. We followed him for a long period palliatively for a deep wound on the right lateral foot. He had refused an amputation probably 8 or 9 years ago and underwent IV antibiotics and hyperbaric oxygen. Miraculously this wound actually healed over I believe in 2019. The patient has a wound on the right lateral plantar heel which is not in the same spot as the previous wound. This is a weightbearing surface. The wound looks fairly benign but there is significant undermining. No erythema. They state that is started as a blister they used leftover silver alginate, Neosporin and trying to offload this as best he can. He has custom-made shoes with an AFO brace on the right. Past medical history is reviewed  predominantly coronary artery disease, pacemaker, stent, type 2 diabetes with a recent hemoglobin A1c of 6.1. ABI in our clinic was 1.0 on the right 3/22; areas on the right lateral heel not any better than last time. He has some undermining medially. This is undoubtedly a weightbearing surface here. This will not be easy to heal. 3/29; this is a patient with type 2 diabetes and her Charcot foot. He has an area on the plantar heel on the right. We have been using silver alginate. He is his own special boot with a brace. 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 Areas on the plantar heel on the right. Improvement in depth. We have been using silver alginate 4/26 Charcot foot. Right plantar heel lateral aspect. No real improvement today. We have been using silver alginate because of drainage. This is the reason he does not want a total contact cast he says the dressing simply needs to be changed daily. He has a new custom shoe with an AFO brace on the right. He states the wound started in his old shoes 5/18; Charcot foot previous foot surgery and probably some degree of localized lymphedema in the right lateral foot. He has specialized shoes with an AFO on this side. There is not an option to offload this wound which is on the right plantar heel lateral aspect. We have been using silver alginate. The wound is somewhat smaller. He does not have an arterial issue Electronic Signature(s) Signed: 03/18/2020 12:52:55 PM By: Linton Ham MD Entered By: Linton Ham on 03/17/2020 07:56:34 -------------------------------------------------------------------------------- Physical Exam Details Patient Name: Date of Service: Nathan Moll, PA UL D. 03/16/2020 10:00 A M Medical Record Number: [id number] Patient Account Number: [id number] Date of Birth/Sex: Treating RN: 08-15-1951 (69 y.o. Oval Linsey Primary Care Provider: PA Haig Prophet, NO Other Clinician: Referring Provider: Treating Provider/Extender:  Arta Silence in Treatment: 11 Constitutional Patient is hypertensive.. Pulse regular and within target range for patient.[name] Respirations regular, non-labored and within target range.. Temperature is normal and within the target range for the patient.[name] Appears in no distress. Cardiovascular Pedal pulses on the right are palpable. Notes Wound exam; area questions  CAYCEN, NODAL ([id number]) Visit Report for 03/16/2020 Debridement Details Patient Name: Date of Service: Nathan Miller, [place] UL D. 03/16/2020 10:00 A M Medical Record Number: [id number] Patient Account Number: [id number] Date of Birth/Sex: Treating RN: 06-Nov-1950 (68 y.o. Nathan Miller) Carlene Coria Primary Care Provider: PA Haig Prophet, [place] Other Clinician: Referring Provider: Treating Provider/Extender: Arta Silence in Treatment: 11 Debridement Performed for Assessment: Wound #3 Right,Plantar Calcaneus Performed By: Physician Ricard Dillon., MD Debridement Type: Debridement Severity of Tissue Pre Debridement: Fat layer exposed Level of Consciousness (Pre-procedure): Awake and Alert Pre-procedure Verification/Time Out Yes - 11:01 Taken: Start Time: 11:01 Pain Control: Lidocaine 5% topical ointment T Area Debrided (L x W): otal 2 (cm) x 1 (cm) = 2 (cm) Tissue and other material debrided: Viable, Non-Viable, Callus, Slough, Subcutaneous, Skin: Dermis , Skin: Epidermis, Slough Level: Skin/Subcutaneous Tissue Debridement Description: Excisional Instrument: Curette Bleeding: Moderate Hemostasis Achieved: Pressure End Time: 11:03 Procedural Pain: 0 Post Procedural Pain: 0 Response to Treatment: Procedure was tolerated well Level of Consciousness (Post- Awake and Alert procedure): Post Debridement Measurements of Total Wound Length: (cm) 2 Width: (cm) 1 Depth: (cm) 0.2 Volume: (cm) 0.314 Character of Wound/Ulcer Post Debridement: Improved Severity of Tissue Post Debridement: Fat layer exposed Post Procedure Diagnosis Same as Pre-procedure Electronic Signature(s) Signed: 03/18/2020 12:52:55 PM By: Linton Ham MD Signed: 03/19/2020 9:36:08 AM By: Carlene Coria RN Previous Signature: 03/16/2020 5:13:38 PM Version By: Carlene Coria RN Entered By: Linton Ham on 03/17/2020 07:55:37 -------------------------------------------------------------------------------- HPI  Details Patient Name: Date of Service: Nathan Moll, PA UL D. 03/16/2020 10:00 A M Medical Record Number: [id number] Patient Account Number: [id number] Date of Birth/Sex: Treating RN: Mar 14, 1951 (68 y.o. Oval Linsey Primary Care Provider: PA Haig Prophet, NO Other Clinician: Referring Provider: Treating Provider/Extender: Arta Silence in Treatment: 11 History of Present Illness Location: right foot Severity: Wag 3 Duration: #months Modifying Factors: Diabetic and charcot' foot.. Peripheral vascular disease ssociated Signs and Symptoms: osteo A HPI Description: see chief complaint. 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 This is been present for 5-6 years. He underwent a course of IV any biotics and hyperbaric oxygen roughly 5 years ago. He was offered an amputation by orthopedics and I believe at the time I concurred with this however the patient wishes to up go via wait-and-see approach. He is actually done fairly well over the 5 years is still ambulatory. He uses a silver alginate-based dressing on the foot which he changes himself up. 05/27/15; patient returns for his monthly visit the. The status of his foot and his wound are essentially unchanged this patient has a chronic idiopathic neuropathy. He has underlying osteomyelitis. He had extensive orthopedic surgery roughly 3 or 4 years ago had. He has underlying chronic osteomyelitis.[name] He dresses this with silver alginate. We order the supplies. I continue to see him on a monthly basis for wound debridement. 06/24/15 the patient arrives here today for his monthly wound care check. He has underlying chronic osteomyelitis. The wound was surgically debridement of nonviable circumferential tissue. He is continued with silver alginate based stressing severe this area changed daily by his wife. Post debridement this  actually looks as good as I've seen this since quite some time 07/29/15. The patient has been coming here monthly for many years. He has a chronic wound on his right lateral foot at roughly the mid shaft of his fifth metatarsal.  period of time but he could not really tolerate this. In any case this is totally closed. That outcome in itself is really quite amazing READMISSION 12/29/2019 [name] is a now 69 year old man who is in this clinic many years ago for osteomyelitis and a wound on his right lateral foot. 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 He has a Charcot foot deformity. He had extensive orthopedic surgery on this foot as well removing under the underlying metatarsals that at the time had osteomyelitis. We followed him for a long period palliatively for a deep wound on the right lateral foot. He had refused an amputation probably 8 or 9 years ago and underwent IV antibiotics and hyperbaric oxygen. Miraculously this wound actually healed over I believe in 2019. The patient has a wound on the right lateral plantar heel which is not in the same spot as the previous wound. This is a weightbearing surface. The wound looks fairly benign but there is significant undermining. No erythema. They state that is started as a blister they used leftover silver alginate, Neosporin and trying to offload this as best he can. He has custom-made shoes with an AFO brace on the right. Past medical history is reviewed  predominantly coronary artery disease, pacemaker, stent, type 2 diabetes with a recent hemoglobin A1c of 6.1. ABI in our clinic was 1.0 on the right 3/22; areas on the right lateral heel not any better than last time. He has some undermining medially. This is undoubtedly a weightbearing surface here. This will not be easy to heal. 3/29; this is a patient with type 2 diabetes and her Charcot foot. He has an area on the plantar heel on the right. We have been using silver alginate. He is his own special boot with a brace. 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 Areas on the plantar heel on the right. Improvement in depth. We have been using silver alginate 4/26 Charcot foot. Right plantar heel lateral aspect. No real improvement today. We have been using silver alginate because of drainage. This is the reason he does not want a total contact cast he says the dressing simply needs to be changed daily. He has a new custom shoe with an AFO brace on the right. He states the wound started in his old shoes 5/18; Charcot foot previous foot surgery and probably some degree of localized lymphedema in the right lateral foot. He has specialized shoes with an AFO on this side. There is not an option to offload this wound which is on the right plantar heel lateral aspect. We have been using silver alginate. The wound is somewhat smaller. He does not have an arterial issue Electronic Signature(s) Signed: 03/18/2020 12:52:55 PM By: Linton Ham MD Entered By: Linton Ham on 03/17/2020 07:56:34 -------------------------------------------------------------------------------- Physical Exam Details Patient Name: Date of Service: Nathan Moll, PA UL D. 03/16/2020 10:00 A M Medical Record Number: [id number] Patient Account Number: [id number] Date of Birth/Sex: Treating RN: 08-15-1951 (69 y.o. Oval Linsey Primary Care Provider: PA Haig Prophet, NO Other Clinician: Referring Provider: Treating Provider/Extender:  Arta Silence in Treatment: 11 Constitutional Patient is hypertensive.. Pulse regular and within target range for patient.[name] Respirations regular, non-labored and within target range.. Temperature is normal and within the target range for the patient.[name] Appears in no distress. Cardiovascular Pedal pulses on the right are palpable. Notes Wound exam; area questions  that dressing is changed Primary Wound Dressing: Wound #3 Right,Plantar Calcaneus: Calcium Alginate with Silver Secondary Dressing: Wound #3 Right,Plantar Calcaneus: Foam - foam donut Kerlix/Rolled Gauze - secure with tape Dry Gauze Other: - ace wrap over dressing Off-Loading: Turn and reposition every 2 hours Other: - float heels off of bed/chair with pillow under calves 1. The wound looks somewhat better. By my view of this also smaller in terms of surface area there is no depth 2. Continued with silver alginate, gauze and I have asked the nurses to show him how to Ace wrap this area. I would like to get some control of the swelling if that is possible. I do not believe he would agree to a compression wrap and weekly visits. He would not agree to a total contact cast or even an attempt at it because of drainage and the frequency of visits that would entail 3. Follow-up in 2 weeks Electronic Signature(s) Signed: 03/18/2020 12:52:55 PM By: Linton Ham MD Entered By: Linton Ham on 03/17/2020 07:59:23 -------------------------------------------------------------------------------- SuperBill Details Patient  Name: Date of Service: Nathan Miller, [place] UL D. 03/16/2020 Medical Record Number: [id number] Patient Account Number: [id number] Date of Birth/Sex: Treating RN: 02-Sep-1951 (69 y.o. Nathan Miller) Carlene Coria Primary Care Provider: PA Haig Prophet, NO Other Clinician: Referring Provider: Treating Provider/Extender: Arta Silence in Treatment: 11 Diagnosis Coding ICD-10 Codes Code Description E11.621 Type 2 diabetes mellitus with foot ulcer 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 of other part of right foot with fat layer exposed E11.42 Type 2 diabetes mellitus with diabetic polyneuropathy M14.671 Charcot's joint, right ankle and foot Facility Procedures CPT4 Code: [id number] Description: [id number] - DEB SUBQ TISSUE 20 SQ CM/< ICD-10 Diagnosis Description E11.621 Type 2 diabetes mellitus with foot ulcer 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 of other part of right foot with fat layer exposed Modifier: Quantity: 1 Physician Procedures : CPT4 Code Description Modifier [id number] - WC PHYS SUBQ TISS 20 SQ CM ICD-10 Diagnosis Description E11.621 Type 2 diabetes mellitus with foot ulcer 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 of other part of right foot with fat layer exposed Quantity: 1 Electronic Signature(s) Signed: 03/18/2020 12:52:55 PM By: Linton Ham MD Entered By: Linton Ham on 03/17/2020 07:59:45  period of time but he could not really tolerate this. In any case this is totally closed. That outcome in itself is really quite amazing READMISSION 12/29/2019 [name] is a now 69 year old man who is in this clinic many years ago for osteomyelitis and a wound on his right lateral foot. 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 He has a Charcot foot deformity. He had extensive orthopedic surgery on this foot as well removing under the underlying metatarsals that at the time had osteomyelitis. We followed him for a long period palliatively for a deep wound on the right lateral foot. He had refused an amputation probably 8 or 9 years ago and underwent IV antibiotics and hyperbaric oxygen. Miraculously this wound actually healed over I believe in 2019. The patient has a wound on the right lateral plantar heel which is not in the same spot as the previous wound. This is a weightbearing surface. The wound looks fairly benign but there is significant undermining. No erythema. They state that is started as a blister they used leftover silver alginate, Neosporin and trying to offload this as best he can. He has custom-made shoes with an AFO brace on the right. Past medical history is reviewed  predominantly coronary artery disease, pacemaker, stent, type 2 diabetes with a recent hemoglobin A1c of 6.1. ABI in our clinic was 1.0 on the right 3/22; areas on the right lateral heel not any better than last time. He has some undermining medially. This is undoubtedly a weightbearing surface here. This will not be easy to heal. 3/29; this is a patient with type 2 diabetes and her Charcot foot. He has an area on the plantar heel on the right. We have been using silver alginate. He is his own special boot with a brace. 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 Areas on the plantar heel on the right. Improvement in depth. We have been using silver alginate 4/26 Charcot foot. Right plantar heel lateral aspect. No real improvement today. We have been using silver alginate because of drainage. This is the reason he does not want a total contact cast he says the dressing simply needs to be changed daily. He has a new custom shoe with an AFO brace on the right. He states the wound started in his old shoes 5/18; Charcot foot previous foot surgery and probably some degree of localized lymphedema in the right lateral foot. He has specialized shoes with an AFO on this side. There is not an option to offload this wound which is on the right plantar heel lateral aspect. We have been using silver alginate. The wound is somewhat smaller. He does not have an arterial issue Electronic Signature(s) Signed: 03/18/2020 12:52:55 PM By: Linton Ham MD Entered By: Linton Ham on 03/17/2020 07:56:34 -------------------------------------------------------------------------------- Physical Exam Details Patient Name: Date of Service: Nathan Moll, PA UL D. 03/16/2020 10:00 A M Medical Record Number: [id number] Patient Account Number: [id number] Date of Birth/Sex: Treating RN: 08-15-1951 (69 y.o. Oval Linsey Primary Care Provider: PA Haig Prophet, NO Other Clinician: Referring Provider: Treating Provider/Extender:  Arta Silence in Treatment: 11 Constitutional Patient is hypertensive.. Pulse regular and within target range for patient.[name] Respirations regular, non-labored and within target range.. Temperature is normal and within the target range for the patient.[name] Appears in no distress. Cardiovascular Pedal pulses on the right are palpable. Notes Wound exam; area questions  that dressing is changed Primary Wound Dressing: Wound #3 Right,Plantar Calcaneus: Calcium Alginate with Silver Secondary Dressing: Wound #3 Right,Plantar Calcaneus: Foam - foam donut Kerlix/Rolled Gauze - secure with tape Dry Gauze Other: - ace wrap over dressing Off-Loading: Turn and reposition every 2 hours Other: - float heels off of bed/chair with pillow under calves 1. The wound looks somewhat better. By my view of this also smaller in terms of surface area there is no depth 2. Continued with silver alginate, gauze and I have asked the nurses to show him how to Ace wrap this area. I would like to get some control of the swelling if that is possible. I do not believe he would agree to a compression wrap and weekly visits. He would not agree to a total contact cast or even an attempt at it because of drainage and the frequency of visits that would entail 3. Follow-up in 2 weeks Electronic Signature(s) Signed: 03/18/2020 12:52:55 PM By: Linton Ham MD Entered By: Linton Ham on 03/17/2020 07:59:23 -------------------------------------------------------------------------------- SuperBill Details Patient  Name: Date of Service: Nathan Miller, [place] UL D. 03/16/2020 Medical Record Number: [id number] Patient Account Number: [id number] Date of Birth/Sex: Treating RN: 02-Sep-1951 (69 y.o. Nathan Miller) Carlene Coria Primary Care Provider: PA Haig Prophet, NO Other Clinician: Referring Provider: Treating Provider/Extender: Arta Silence in Treatment: 11 Diagnosis Coding ICD-10 Codes Code Description E11.621 Type 2 diabetes mellitus with foot ulcer 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 of other part of right foot with fat layer exposed E11.42 Type 2 diabetes mellitus with diabetic polyneuropathy M14.671 Charcot's joint, right ankle and foot Facility Procedures CPT4 Code: [id number] Description: [id number] - DEB SUBQ TISSUE 20 SQ CM/< ICD-10 Diagnosis Description E11.621 Type 2 diabetes mellitus with foot ulcer 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 of other part of right foot with fat layer exposed Modifier: Quantity: 1 Physician Procedures : CPT4 Code Description Modifier [id number] - WC PHYS SUBQ TISS 20 SQ CM ICD-10 Diagnosis Description E11.621 Type 2 diabetes mellitus with foot ulcer 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 of other part of right foot with fat layer exposed Quantity: 1 Electronic Signature(s) Signed: 03/18/2020 12:52:55 PM By: Linton Ham MD Entered By: Linton Ham on 03/17/2020 07:59:45  on the right lateral heel. Perhaps slightly smaller. Debrided of some fibrinous surface debris and some nonviable surrounding skin. Hemostasis with direct pressure. There is still a lot of swelling around this area. Healing is complicated by the fact that this is likely a weightbearing surface the way he walks. His previous wound was also on this side of the foot. No evidence of infection Electronic Signature(s) Signed: 03/18/2020 12:52:55 PM By: Linton Ham MD Entered By: Linton Ham on 03/17/2020 07:58:00 -------------------------------------------------------------------------------- Physician Orders Details Patient Name: Date of Service: Nathan Miller, [place] UL D. 03/16/2020 10:00 A M Medical Record Number: [id number] Patient Account Number: [id number] Date of Birth/Sex: Treating RN: 07-Mar-1951 (68 y.o. Nathan Miller) Carlene Coria Primary Care Provider: PA Haig Prophet, NO Other Clinician: Referring Provider: Treating Provider/Extender: Arta Silence in Treatment: 11 Verbal / Phone Orders: No Diagnosis Coding ICD-10 Coding Code Description E11.621 Type 2 diabetes mellitus with foot ulcer 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 of other part of right foot with fat layer exposed E11.42 Type 2 diabetes mellitus with diabetic polyneuropathy M14.671 Charcot's joint, right ankle and foot Follow-up Appointments Return Appointment in 2 weeks. Dressing Change Frequency Wound #3 Right,Plantar Calcaneus Change dressing every day. Wound Cleansing Wound #3 Right,Plantar Calcaneus May shower and wash wound with  soap and water. - on days that dressing is changed Primary Wound Dressing Wound #3 Right,Plantar Calcaneus Calcium Alginate with Silver Secondary Dressing Wound #3 Right,Plantar Calcaneus Foam - foam donut Kerlix/Rolled Gauze - secure with tape Dry Gauze Other: - ace wrap over dressing Off-Loading Turn and reposition every 2 hours Other: - float heels off of bed/chair with pillow under calves Electronic Signature(s) Signed: 03/16/2020 5:13:38 PM By: Carlene Coria RN Signed: 03/18/2020 12:52:55 PM By: Linton Ham MD Signed: 03/18/2020 12:52:55 PM By: Linton Ham MD Entered By: Carlene Coria on 03/16/2020 11:05:28 -------------------------------------------------------------------------------- Problem List Details Patient Name: Date of Service: Nathan Moll, PA UL D. 03/16/2020 10:00 A M Medical Record Number: [id number] Patient Account Number: [id number] Date of Birth/Sex: Treating RN: 09-22-1951 (68 y.o. Oval Linsey Primary Care Provider: PA Haig Prophet, [place] Other Clinician: Referring Provider: Treating Provider/Extender: Arta Silence in Treatment: 11 Active Problems ICD-10 Encounter Code Description Active Date MDM Diagnosis E11.621 Type 2 diabetes mellitus with foot ulcer 12/29/2019 No Yes 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 of other part of right foot with fat layer exposed 12/29/2019 No Yes E11.42 Type 2 diabetes mellitus with diabetic polyneuropathy 12/29/2019 No Yes M14.671 Charcot's joint, right ankle and foot 12/29/2019 No Yes Inactive Problems Resolved Problems Electronic Signature(s) Signed: 03/18/2020 12:52:55 PM By: Linton Ham MD Previous Signature: 03/16/2020 5:13:38 PM Version By: Carlene Coria RN Entered By: Linton Ham on 03/17/2020 07:54:33 -------------------------------------------------------------------------------- Progress Note Details Patient Name: Date of Service: Nathan Moll, PA UL D. 03/16/2020 10:00 A M Medical Record Number:  [id number] Patient Account Number: [id number] Date of Birth/Sex: Treating RN: [birth year] (68 y.o. Nathan Miller) Carlene Coria Primary Care Provider: PA Haig Prophet, NO Other Clinician: Referring Provider: Treating Provider/Extender: Arta Silence in Treatment: 11 Subjective History of Present Illness (HPI) The following HPI elements were documented for the patient's wound: Location: right foot Severity: Wag 3 Duration: #months Modifying Factors: Diabetic and charcot' foot.. Peripheral vascular disease Associated Signs and Symptoms: osteo see chief complaint. 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 This is been present for 5-6 years. He underwent a course of IV any biotics and

## 2020-03-25 ENCOUNTER — Ambulatory Visit (INDEPENDENT_AMBULATORY_CARE_PROVIDER_SITE_OTHER): Payer: PPO | Admitting: *Deleted

## 2020-03-25 DIAGNOSIS — I442 Atrioventricular block, complete: Secondary | ICD-10-CM | POA: Diagnosis not present

## 2020-03-25 LAB — CUP PACEART REMOTE DEVICE CHECK
Battery Remaining Longevity: 69 mo
Battery Voltage: 3 V
Brady Statistic RV Percent Paced: 99.78 %
Date Time Interrogation Session: 20210527083845
Implantable Lead Implant Date: 20160325
Implantable Lead Location: 753860
Implantable Lead Model: 5076
Implantable Pulse Generator Implant Date: 20160325
Lead Channel Impedance Value: 475 Ohm
Lead Channel Impedance Value: 513 Ohm
Lead Channel Pacing Threshold Amplitude: 0.625 V
Lead Channel Pacing Threshold Pulse Width: 0.4 ms
Lead Channel Sensing Intrinsic Amplitude: 21.375 mV
Lead Channel Sensing Intrinsic Amplitude: 21.375 mV
Lead Channel Setting Pacing Amplitude: 2.5 V
Lead Channel Setting Pacing Pulse Width: 0.4 ms
Lead Channel Setting Sensing Sensitivity: 0.9 mV

## 2020-03-26 NOTE — Progress Notes (Signed)
Remote pacemaker transmission.   

## 2020-03-30 ENCOUNTER — Encounter (HOSPITAL_BASED_OUTPATIENT_CLINIC_OR_DEPARTMENT_OTHER): Payer: PPO | Admitting: Internal Medicine

## 2020-04-06 ENCOUNTER — Encounter (HOSPITAL_BASED_OUTPATIENT_CLINIC_OR_DEPARTMENT_OTHER): Payer: PPO | Attending: Internal Medicine | Admitting: Internal Medicine

## 2020-04-06 ENCOUNTER — Other Ambulatory Visit: Payer: Self-pay

## 2020-04-06 DIAGNOSIS — E1169 Type 2 diabetes mellitus with other specified complication: Secondary | ICD-10-CM | POA: Diagnosis not present

## 2020-04-06 DIAGNOSIS — E11621 Type 2 diabetes mellitus with foot ulcer: Secondary | ICD-10-CM | POA: Diagnosis not present

## 2020-04-06 DIAGNOSIS — Z9889 Other specified postprocedural states: Secondary | ICD-10-CM | POA: Diagnosis not present

## 2020-04-06 DIAGNOSIS — M869 Osteomyelitis, unspecified: Secondary | ICD-10-CM | POA: Insufficient documentation

## 2020-04-06 DIAGNOSIS — Z87891 Personal history of nicotine dependence: Secondary | ICD-10-CM | POA: Diagnosis not present

## 2020-04-06 DIAGNOSIS — I10 Essential (primary) hypertension: Secondary | ICD-10-CM | POA: Diagnosis not present

## 2020-04-06 DIAGNOSIS — I2510 Atherosclerotic heart disease of native coronary artery without angina pectoris: Secondary | ICD-10-CM | POA: Diagnosis not present

## 2020-04-06 DIAGNOSIS — E1151 Type 2 diabetes mellitus with diabetic peripheral angiopathy without gangrene: Secondary | ICD-10-CM | POA: Diagnosis not present

## 2020-04-06 DIAGNOSIS — Z950 Presence of cardiac pacemaker: Secondary | ICD-10-CM | POA: Diagnosis not present

## 2020-04-06 DIAGNOSIS — E1142 Type 2 diabetes mellitus with diabetic polyneuropathy: Secondary | ICD-10-CM | POA: Diagnosis not present

## 2020-04-06 DIAGNOSIS — E11610 Type 2 diabetes mellitus with diabetic neuropathic arthropathy: Secondary | ICD-10-CM | POA: Insufficient documentation

## 2020-04-06 DIAGNOSIS — L97512 Non-pressure chronic ulcer of other part of right foot with fat layer exposed: Secondary | ICD-10-CM | POA: Insufficient documentation

## 2020-04-06 DIAGNOSIS — F419 Anxiety disorder, unspecified: Secondary | ICD-10-CM | POA: Insufficient documentation

## 2020-04-06 DIAGNOSIS — L97412 Non-pressure chronic ulcer of right heel and midfoot with fat layer exposed: Secondary | ICD-10-CM | POA: Diagnosis not present

## 2020-04-06 DIAGNOSIS — M1990 Unspecified osteoarthritis, unspecified site: Secondary | ICD-10-CM | POA: Insufficient documentation

## 2020-04-07 DIAGNOSIS — T8189XA Other complications of procedures, not elsewhere classified, initial encounter: Secondary | ICD-10-CM | POA: Diagnosis not present

## 2020-04-07 NOTE — Progress Notes (Signed)
Nathan Miller, Nathan Miller ([id number]) Visit Report for 04/06/2020 Debridement Details Patient Name: Date of Service: Nathan Miller, [place] D. 04/06/2020 9:30 A M Medical Record Number: [id number] Patient Account Number: [id number] Date of Birth/Sex: Treating RN: 10-15-1951 (68 y.o. Nathan Miller) Carlene Coria Primary Care Provider: PA Haig Prophet, [place] Other Clinician: Referring Provider: Treating Provider/Extender: Arta Silence in Treatment: 14 Debridement Performed for Assessment: Wound #3 Right,Plantar Calcaneus Performed By: Physician Ricard Dillon., MD Debridement Type: Debridement Severity of Tissue Pre Debridement: Fat layer exposed Level of Consciousness (Pre-procedure): Awake and Alert Pre-procedure Verification/Time Out Yes - 11:02 Taken: Start Time: 11:02 Pain Control: Lidocaine 5% topical ointment T Area Debrided (L x W): otal 1.2 (cm) x 0.8 (cm) = 0.96 (cm) Tissue and other material debrided: Viable, Non-Viable, Callus, Subcutaneous, Skin: Dermis , Skin: Epidermis Level: Skin/Subcutaneous Tissue Debridement Description: Excisional Instrument: Curette Bleeding: Minimum Hemostasis Achieved: Pressure End Time: 11:04 Procedural Pain: 0 Post Procedural Pain: 0 Response to Treatment: Procedure was tolerated well Level of Consciousness (Post- Awake and Alert procedure): Post Debridement Measurements of Total Wound Length: (cm) 1.5 Width: (cm) 0.8 Depth: (cm) 0.4 Volume: (cm) 0.377 Character of Wound/Ulcer Post Debridement: Improved Severity of Tissue Post Debridement: Fat layer exposed Post Procedure Diagnosis Same as Pre-procedure Electronic Signature(s) Signed: 04/06/2020 4:34:33 PM By: Carlene Coria RN Signed: 04/07/2020 4:17:30 PM By: Linton Ham MD Entered By: Linton Ham on 04/06/2020 11:06:44 -------------------------------------------------------------------------------- HPI Details Patient Name: Date of Service: Nathan Moll, PA UL D. 04/06/2020 9:30 A M Medical  Record Number: [id number] Patient Account Number: [id number] Date of Birth/Sex: Treating RN: [birth year] (68 y.o. Nathan Miller Primary Care Provider: PA Haig Prophet, [place] Other Clinician: Referring Provider: Treating Provider/Extender: Arta Silence in Treatment: 14 History of Present Illness Location: right foot Severity: Wag 3 Duration: #months Modifying Factors: Diabetic and charcot' foot.. Peripheral vascular disease ssociated Signs and Symptoms: osteo A HPI Description: see chief complaint. 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 This is been present for 5-6 years. He underwent a course of IV any biotics and hyperbaric oxygen roughly 5 years ago. He was offered an amputation by orthopedics and I believe at the time I concurred with this however the patient wishes to up go via wait-and-see approach. He is actually done fairly well over the 5 years is still ambulatory. He uses a silver alginate-based dressing on the foot which he changes himself up. 05/27/15; patient returns for his monthly visit the. The status of his foot and his wound are essentially unchanged this patient has a chronic idiopathic neuropathy. He has underlying osteomyelitis. He had extensive orthopedic surgery roughly 3 or 4 years ago had. He has underlying chronic osteomyelitis.[name] He dresses this with silver alginate. We order the supplies. I continue to see him on a monthly basis for wound debridement. 06/24/15 the patient arrives here today for his monthly wound care check. He has underlying chronic osteomyelitis. The wound was surgically debridement of nonviable circumferential tissue. He is continued with silver alginate based stressing severe this area changed daily by his wife. Post debridement this actually looks as good as I've seen this since quite some time 07/29/15. The patient has been  coming here monthly for many years. He has a chronic wound on his right lateral foot at roughly the mid shaft of his fifth metatarsal. Initially he underwent a code course of IV antibiotics hyperbaric oxygen, resection  Nathan Miller, Nathan Miller ([id number]) Visit Report for 04/06/2020 Debridement Details Patient Name: Date of Service: Nathan Miller, [place] D. 04/06/2020 9:30 A M Medical Record Number: [id number] Patient Account Number: [id number] Date of Birth/Sex: Treating RN: 10-15-1951 (68 y.o. Nathan Miller) Carlene Coria Primary Care Provider: PA Haig Prophet, [place] Other Clinician: Referring Provider: Treating Provider/Extender: Arta Silence in Treatment: 14 Debridement Performed for Assessment: Wound #3 Right,Plantar Calcaneus Performed By: Physician Ricard Dillon., MD Debridement Type: Debridement Severity of Tissue Pre Debridement: Fat layer exposed Level of Consciousness (Pre-procedure): Awake and Alert Pre-procedure Verification/Time Out Yes - 11:02 Taken: Start Time: 11:02 Pain Control: Lidocaine 5% topical ointment T Area Debrided (L x W): otal 1.2 (cm) x 0.8 (cm) = 0.96 (cm) Tissue and other material debrided: Viable, Non-Viable, Callus, Subcutaneous, Skin: Dermis , Skin: Epidermis Level: Skin/Subcutaneous Tissue Debridement Description: Excisional Instrument: Curette Bleeding: Minimum Hemostasis Achieved: Pressure End Time: 11:04 Procedural Pain: 0 Post Procedural Pain: 0 Response to Treatment: Procedure was tolerated well Level of Consciousness (Post- Awake and Alert procedure): Post Debridement Measurements of Total Wound Length: (cm) 1.5 Width: (cm) 0.8 Depth: (cm) 0.4 Volume: (cm) 0.377 Character of Wound/Ulcer Post Debridement: Improved Severity of Tissue Post Debridement: Fat layer exposed Post Procedure Diagnosis Same as Pre-procedure Electronic Signature(s) Signed: 04/06/2020 4:34:33 PM By: Carlene Coria RN Signed: 04/07/2020 4:17:30 PM By: Linton Ham MD Entered By: Linton Ham on 04/06/2020 11:06:44 -------------------------------------------------------------------------------- HPI Details Patient Name: Date of Service: Nathan Moll, PA UL D. 04/06/2020 9:30 A M Medical  Record Number: [id number] Patient Account Number: [id number] Date of Birth/Sex: Treating RN: [birth year] (68 y.o. Nathan Miller Primary Care Provider: PA Haig Prophet, [place] Other Clinician: Referring Provider: Treating Provider/Extender: Arta Silence in Treatment: 14 History of Present Illness Location: right foot Severity: Wag 3 Duration: #months Modifying Factors: Diabetic and charcot' foot.. Peripheral vascular disease ssociated Signs and Symptoms: osteo A HPI Description: see chief complaint. 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 This is been present for 5-6 years. He underwent a course of IV any biotics and hyperbaric oxygen roughly 5 years ago. He was offered an amputation by orthopedics and I believe at the time I concurred with this however the patient wishes to up go via wait-and-see approach. He is actually done fairly well over the 5 years is still ambulatory. He uses a silver alginate-based dressing on the foot which he changes himself up. 05/27/15; patient returns for his monthly visit the. The status of his foot and his wound are essentially unchanged this patient has a chronic idiopathic neuropathy. He has underlying osteomyelitis. He had extensive orthopedic surgery roughly 3 or 4 years ago had. He has underlying chronic osteomyelitis.[name] He dresses this with silver alginate. We order the supplies. I continue to see him on a monthly basis for wound debridement. 06/24/15 the patient arrives here today for his monthly wound care check. He has underlying chronic osteomyelitis. The wound was surgically debridement of nonviable circumferential tissue. He is continued with silver alginate based stressing severe this area changed daily by his wife. Post debridement this actually looks as good as I've seen this since quite some time 07/29/15. The patient has been  coming here monthly for many years. He has a chronic wound on his right lateral foot at roughly the mid shaft of his fifth metatarsal. Initially he underwent a code course of IV antibiotics hyperbaric oxygen, resection  Wound #3 Right,Plantar Calcaneus: Change dressing every day. Wound Cleansing: Wound #3 Right,Plantar Calcaneus: May shower and wash wound with soap and water. - on days that dressing is changed Primary Wound Dressing: Wound #3 Right,Plantar Calcaneus: Calcium Alginate with Silver Secondary Dressing: Wound #3 Right,Plantar Calcaneus: Foam - foam donut Kerlix/Rolled Gauze - secure with tape Dry Gauze Other: - ace wrap over dressing Off-Loading: Turn and reposition every 2 hours Other: - float heels off of bed/chair with pillow under calves 1. I continued with the silver alginate 2. I asked him to Ace wrap this area snugly so we could control some of the swelling. He says he is doing this. 3. There is no evidence of infection here. 4. There is no way to offload this further. Electronic Signature(s) Signed: 04/07/2020 4:17:30 PM By: Linton Ham MD Entered By: Linton Ham on 04/06/2020 11:09:19 -------------------------------------------------------------------------------- SuperBill Details Patient Name: Date of Service: Nathan Miller, [place] D. 04/06/2020 Medical Record Number: [id number] Patient Account Number: [id number] Date of Birth/Sex: Treating RN: May 11, 1951 (68 y.o. 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, right ankle and foot Facility Procedures CPT4 Code: 73710626 Description: 94854 - DEB SUBQ TISSUE 20 SQ CM/< ICD-10 Diagnosis Description E11.621 Type 2 diabetes mellitus with foot ulcer 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 of other part of right foot with fat layer exposed Modifier: Quantity: 1 Physician Procedures : CPT4 Code Description Modifier [id number] 09381 - WC PHYS SUBQ TISS 20 SQ CM ICD-10 Diagnosis Description E11.621 Type 2 diabetes mellitus with foot ulcer 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 of other part of right foot with fat layer exposed Quantity: 1 Electronic Signature(s) Signed: 04/07/2020 4:17:30 PM By: Linton Ham MD Entered By: Linton Ham on 04/06/2020 11:09:37  Nathan Miller, Nathan Miller ([id number]) Visit Report for 04/06/2020 Debridement Details Patient Name: Date of Service: Nathan Miller, [place] D. 04/06/2020 9:30 A M Medical Record Number: [id number] Patient Account Number: [id number] Date of Birth/Sex: Treating RN: 10-15-1951 (68 y.o. Nathan Miller) Carlene Coria Primary Care Provider: PA Haig Prophet, [place] Other Clinician: Referring Provider: Treating Provider/Extender: Arta Silence in Treatment: 14 Debridement Performed for Assessment: Wound #3 Right,Plantar Calcaneus Performed By: Physician Ricard Dillon., MD Debridement Type: Debridement Severity of Tissue Pre Debridement: Fat layer exposed Level of Consciousness (Pre-procedure): Awake and Alert Pre-procedure Verification/Time Out Yes - 11:02 Taken: Start Time: 11:02 Pain Control: Lidocaine 5% topical ointment T Area Debrided (L x W): otal 1.2 (cm) x 0.8 (cm) = 0.96 (cm) Tissue and other material debrided: Viable, Non-Viable, Callus, Subcutaneous, Skin: Dermis , Skin: Epidermis Level: Skin/Subcutaneous Tissue Debridement Description: Excisional Instrument: Curette Bleeding: Minimum Hemostasis Achieved: Pressure End Time: 11:04 Procedural Pain: 0 Post Procedural Pain: 0 Response to Treatment: Procedure was tolerated well Level of Consciousness (Post- Awake and Alert procedure): Post Debridement Measurements of Total Wound Length: (cm) 1.5 Width: (cm) 0.8 Depth: (cm) 0.4 Volume: (cm) 0.377 Character of Wound/Ulcer Post Debridement: Improved Severity of Tissue Post Debridement: Fat layer exposed Post Procedure Diagnosis Same as Pre-procedure Electronic Signature(s) Signed: 04/06/2020 4:34:33 PM By: Carlene Coria RN Signed: 04/07/2020 4:17:30 PM By: Linton Ham MD Entered By: Linton Ham on 04/06/2020 11:06:44 -------------------------------------------------------------------------------- HPI Details Patient Name: Date of Service: Nathan Moll, PA UL D. 04/06/2020 9:30 A M Medical  Record Number: [id number] Patient Account Number: [id number] Date of Birth/Sex: Treating RN: [birth year] (68 y.o. Nathan Miller Primary Care Provider: PA Haig Prophet, [place] Other Clinician: Referring Provider: Treating Provider/Extender: Arta Silence in Treatment: 14 History of Present Illness Location: right foot Severity: Wag 3 Duration: #months Modifying Factors: Diabetic and charcot' foot.. Peripheral vascular disease ssociated Signs and Symptoms: osteo A HPI Description: see chief complaint. 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 This is been present for 5-6 years. He underwent a course of IV any biotics and hyperbaric oxygen roughly 5 years ago. He was offered an amputation by orthopedics and I believe at the time I concurred with this however the patient wishes to up go via wait-and-see approach. He is actually done fairly well over the 5 years is still ambulatory. He uses a silver alginate-based dressing on the foot which he changes himself up. 05/27/15; patient returns for his monthly visit the. The status of his foot and his wound are essentially unchanged this patient has a chronic idiopathic neuropathy. He has underlying osteomyelitis. He had extensive orthopedic surgery roughly 3 or 4 years ago had. He has underlying chronic osteomyelitis.[name] He dresses this with silver alginate. We order the supplies. I continue to see him on a monthly basis for wound debridement. 06/24/15 the patient arrives here today for his monthly wound care check. He has underlying chronic osteomyelitis. The wound was surgically debridement of nonviable circumferential tissue. He is continued with silver alginate based stressing severe this area changed daily by his wife. Post debridement this actually looks as good as I've seen this since quite some time 07/29/15. The patient has been  coming here monthly for many years. He has a chronic wound on his right lateral foot at roughly the mid shaft of his fifth metatarsal. Initially he underwent a code course of IV antibiotics hyperbaric oxygen, resection  Nathan Miller, Nathan Miller ([id number]) Visit Report for 04/06/2020 Debridement Details Patient Name: Date of Service: Nathan Miller, [place] D. 04/06/2020 9:30 A M Medical Record Number: [id number] Patient Account Number: [id number] Date of Birth/Sex: Treating RN: 10-15-1951 (68 y.o. Nathan Miller) Carlene Coria Primary Care Provider: PA Haig Prophet, [place] Other Clinician: Referring Provider: Treating Provider/Extender: Arta Silence in Treatment: 14 Debridement Performed for Assessment: Wound #3 Right,Plantar Calcaneus Performed By: Physician Ricard Dillon., MD Debridement Type: Debridement Severity of Tissue Pre Debridement: Fat layer exposed Level of Consciousness (Pre-procedure): Awake and Alert Pre-procedure Verification/Time Out Yes - 11:02 Taken: Start Time: 11:02 Pain Control: Lidocaine 5% topical ointment T Area Debrided (L x W): otal 1.2 (cm) x 0.8 (cm) = 0.96 (cm) Tissue and other material debrided: Viable, Non-Viable, Callus, Subcutaneous, Skin: Dermis , Skin: Epidermis Level: Skin/Subcutaneous Tissue Debridement Description: Excisional Instrument: Curette Bleeding: Minimum Hemostasis Achieved: Pressure End Time: 11:04 Procedural Pain: 0 Post Procedural Pain: 0 Response to Treatment: Procedure was tolerated well Level of Consciousness (Post- Awake and Alert procedure): Post Debridement Measurements of Total Wound Length: (cm) 1.5 Width: (cm) 0.8 Depth: (cm) 0.4 Volume: (cm) 0.377 Character of Wound/Ulcer Post Debridement: Improved Severity of Tissue Post Debridement: Fat layer exposed Post Procedure Diagnosis Same as Pre-procedure Electronic Signature(s) Signed: 04/06/2020 4:34:33 PM By: Carlene Coria RN Signed: 04/07/2020 4:17:30 PM By: Linton Ham MD Entered By: Linton Ham on 04/06/2020 11:06:44 -------------------------------------------------------------------------------- HPI Details Patient Name: Date of Service: Nathan Moll, PA [place] D. 04/06/2020 9:30 A M Medical  Record Number: [id number] Patient Account Number: [id number] Date of Birth/Sex: Treating RN: [birth year] (68 y.o. Nathan Miller Primary Care Provider: PA Haig Prophet, [place] Other Clinician: Referring Provider: Treating Provider/Extender: Arta Silence in Treatment: 14 History of Present Illness Location: right foot Severity: Wag 3 Duration: #months Modifying Factors: Diabetic and charcot' foot.. Peripheral vascular disease ssociated Signs and Symptoms: osteo A HPI Description: see chief complaint. 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 This is been present for 5-6 years. He underwent a course of IV any biotics and hyperbaric oxygen roughly 5 years ago. He was offered an amputation by orthopedics and I believe at the time I concurred with this however the patient wishes to up go via wait-and-see approach. He is actually done fairly well over the 5 years is still ambulatory. He uses a silver alginate-based dressing on the foot which he changes himself up. 05/27/15; patient returns for his monthly visit the. The status of his foot and his wound are essentially unchanged this patient has a chronic idiopathic neuropathy. He has underlying osteomyelitis. He had extensive orthopedic surgery roughly 3 or 4 years ago had. He has underlying chronic osteomyelitis.[name] He dresses this with silver alginate. We order the supplies. I continue to see him on a monthly basis for wound debridement. 06/24/15 the patient arrives here today for his monthly wound care check. He has underlying chronic osteomyelitis. The wound was surgically debridement of nonviable circumferential tissue. He is continued with silver alginate based stressing severe this area changed daily by his wife. Post debridement this actually looks as good as I've seen this since quite some time 07/29/15. The patient has been  coming here monthly for many years. He has a chronic wound on his right lateral foot at roughly the mid shaft of his fifth metatarsal. Initially he underwent a code course of IV antibiotics hyperbaric oxygen, resection  case this is totally closed. That outcome in itself is really quite amazing READMISSION 12/29/2019 Nathan Miller is a now 69 year old man who is in this clinic many years ago for osteomyelitis and a wound on his right lateral foot. 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 He has a Charcot foot deformity. He had extensive orthopedic surgery on this foot as well removing under the underlying metatarsals that at the time had osteomyelitis. We followed him for a long period palliatively for a deep wound on the right lateral foot. He had refused an amputation probably 8 or 9 years ago and underwent IV antibiotics and hyperbaric oxygen. Miraculously this wound actually healed over I believe in 2019. The patient has a wound on the right lateral plantar heel which is not in the same spot as the previous wound. This is a weightbearing surface. The wound looks fairly benign but there is significant undermining. No erythema. They state that is started as a blister they used leftover silver alginate, Neosporin and trying to offload this as best he can. He has custom-made shoes with an AFO brace on the right. Past medical history is reviewed predominantly coronary artery disease, pacemaker, stent, type 2 diabetes with a recent  hemoglobin A1c of 6.1. ABI in our clinic was 1.0 on the right 3/22; areas on the right lateral heel not any better than last time. He has some undermining medially. This is undoubtedly a weightbearing surface here. This will not be easy to heal. 3/29; this is a patient with type 2 diabetes and her Charcot foot. He has an area on the plantar heel on the right. We have been using silver alginate. He is his own special boot with a brace. 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 Areas on the plantar heel on the right. Improvement in depth. We have been using silver alginate 4/26 Charcot foot. Right plantar heel lateral aspect. No real improvement today. We have been using silver alginate because of drainage. This is the reason he does not want a total contact cast he says the dressing simply needs to be changed daily. He has a new custom shoe with an AFO brace on the right. He states the wound started in his old shoes 5/18; Charcot foot previous foot surgery and probably some degree of localized lymphedema in the right lateral foot. He has specialized shoes with an AFO on this side. There is not an option to offload this wound which is on the right plantar heel lateral aspect. We have been using silver alginate. The wound is somewhat smaller. He does not have an arterial issue 6/8; Charcot foot previous surgery probably some degree of localized lymphedema in the right lateral foot. He has specialized shoes with an AFO brace. Wound is on the right lateral calcaneus. We have been using silver alginate. Dimensions are smaller. Electronic Signature(s) Signed: 04/07/2020 4:17:30 PM By: Linton Ham MD Entered By: Linton Ham on 04/06/2020 11:07:23 -------------------------------------------------------------------------------- Physical Exam Details Patient Name: Date of Service: Nathan Miller, [place] D. 04/06/2020 9:30 A M Medical Record Number: [id number] Patient Account Number: [id number] Date of  Birth/Sex: Treating RN: 20-Aug-1951 (68 y.o. Nathan Miller Primary Care Provider: PA Haig Prophet, [place] Other Clinician: Referring Provider: Treating Provider/Extender: Arta Silence in Treatment: 10 Constitutional Patient is hypertensive.. Pulse regular and within target range for patient.[name] Respirations regular, non-labored and within target range.[name]  Wound #3 Right,Plantar Calcaneus: Change dressing every day. Wound Cleansing: Wound #3 Right,Plantar Calcaneus: May shower and wash wound with soap and water. - on days that dressing is changed Primary Wound Dressing: Wound #3 Right,Plantar Calcaneus: Calcium Alginate with Silver Secondary Dressing: Wound #3 Right,Plantar Calcaneus: Foam - foam donut Kerlix/Rolled Gauze - secure with tape Dry Gauze Other: - ace wrap over dressing Off-Loading: Turn and reposition every 2 hours Other: - float heels off of bed/chair with pillow under calves 1. I continued with the silver alginate 2. I asked him to Ace wrap this area snugly so we could control some of the swelling. He says he is doing this. 3. There is no evidence of infection here. 4. There is no way to offload this further. Electronic Signature(s) Signed: 04/07/2020 4:17:30 PM By: Linton Ham MD Entered By: Linton Ham on 04/06/2020 11:09:19 -------------------------------------------------------------------------------- SuperBill Details Patient Name: Date of Service: Nathan Miller, [place] UL D. 04/06/2020 Medical Record Number: [id number] Patient Account Number: [id number] Date of Birth/Sex: Treating RN: May 11, 1951 (68 y.o. 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, right ankle and foot Facility Procedures CPT4 Code: 73710626 Description: 94854 - DEB SUBQ TISSUE 20 SQ CM/< ICD-10 Diagnosis Description E11.621 Type 2 diabetes mellitus with foot ulcer 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 of other part of right foot with fat layer exposed Modifier: Quantity: 1 Physician Procedures : CPT4 Code Description Modifier [id number] - WC PHYS SUBQ TISS 20 SQ CM ICD-10 Diagnosis Description E11.621 Type 2 diabetes mellitus with foot ulcer 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 of other part of right foot with fat layer exposed Quantity: 1 Electronic Signature(s) Signed: 04/07/2020 4:17:30 PM By: Linton Ham MD Entered By: Linton Ham on 04/06/2020 11:09:37  Nathan Miller, Nathan Miller ([id number]) Visit Report for 04/06/2020 Debridement Details Patient Name: Date of Service: Nathan Miller, [place] D. 04/06/2020 9:30 A M Medical Record Number: [id number] Patient Account Number: [id number] Date of Birth/Sex: Treating RN: 10-15-1951 (68 y.o. Nathan Miller) Carlene Coria Primary Care Provider: PA Haig Prophet, [place] Other Clinician: Referring Provider: Treating Provider/Extender: Arta Silence in Treatment: 14 Debridement Performed for Assessment: Wound #3 Right,Plantar Calcaneus Performed By: Physician Ricard Dillon., MD Debridement Type: Debridement Severity of Tissue Pre Debridement: Fat layer exposed Level of Consciousness (Pre-procedure): Awake and Alert Pre-procedure Verification/Time Out Yes - 11:02 Taken: Start Time: 11:02 Pain Control: Lidocaine 5% topical ointment T Area Debrided (L x W): otal 1.2 (cm) x 0.8 (cm) = 0.96 (cm) Tissue and other material debrided: Viable, Non-Viable, Callus, Subcutaneous, Skin: Dermis , Skin: Epidermis Level: Skin/Subcutaneous Tissue Debridement Description: Excisional Instrument: Curette Bleeding: Minimum Hemostasis Achieved: Pressure End Time: 11:04 Procedural Pain: 0 Post Procedural Pain: 0 Response to Treatment: Procedure was tolerated well Level of Consciousness (Post- Awake and Alert procedure): Post Debridement Measurements of Total Wound Length: (cm) 1.5 Width: (cm) 0.8 Depth: (cm) 0.4 Volume: (cm) 0.377 Character of Wound/Ulcer Post Debridement: Improved Severity of Tissue Post Debridement: Fat layer exposed Post Procedure Diagnosis Same as Pre-procedure Electronic Signature(s) Signed: 04/06/2020 4:34:33 PM By: Carlene Coria RN Signed: 04/07/2020 4:17:30 PM By: Linton Ham MD Entered By: Linton Ham on 04/06/2020 11:06:44 -------------------------------------------------------------------------------- HPI Details Patient Name: Date of Service: Nathan Moll, PA UL D. 04/06/2020 9:30 A M Medical  Record Number: [id number] Patient Account Number: [id number] Date of Birth/Sex: Treating RN: [birth year] (68 y.o. Nathan Miller Primary Care Provider: PA Haig Prophet, [place] Other Clinician: Referring Provider: Treating Provider/Extender: Arta Silence in Treatment: 14 History of Present Illness Location: right foot Severity: Wag 3 Duration: #months Modifying Factors: Diabetic and charcot' foot.. Peripheral vascular disease ssociated Signs and Symptoms: osteo A HPI Description: see chief complaint. 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 This is been present for 5-6 years. He underwent a course of IV any biotics and hyperbaric oxygen roughly 5 years ago. He was offered an amputation by orthopedics and I believe at the time I concurred with this however the patient wishes to up go via wait-and-see approach. He is actually done fairly well over the 5 years is still ambulatory. He uses a silver alginate-based dressing on the foot which he changes himself up. 05/27/15; patient returns for his monthly visit the. The status of his foot and his wound are essentially unchanged this patient has a chronic idiopathic neuropathy. He has underlying osteomyelitis. He had extensive orthopedic surgery roughly 3 or 4 years ago had. He has underlying chronic osteomyelitis.[name] He dresses this with silver alginate. We order the supplies. I continue to see him on a monthly basis for wound debridement. 06/24/15 the patient arrives here today for his monthly wound care check. He has underlying chronic osteomyelitis. The wound was surgically debridement of nonviable circumferential tissue. He is continued with silver alginate based stressing severe this area changed daily by his wife. Post debridement this actually looks as good as I've seen this since quite some time 07/29/15. The patient has been  coming here monthly for many years. He has a chronic wound on his right lateral foot at roughly the mid shaft of his fifth metatarsal. Initially he underwent a code course of IV antibiotics hyperbaric oxygen, resection  Temperature is normal and within the target range for the patient.[name] Appears in no distress. Cardiovascular Pedal pulses are palpable. Notes Wound exam; area questions on the right lateral plantar heel. Slightly smaller. Debrided of some overlying skin from the lateral part of the wound. No bleeding was noted. There is no evidence of infection he does have significant edema around the wound. I asked him to Ace wrap this last time he says he is doing it. There is no evidence of infection Electronic Signature(s) Signed: 04/07/2020 4:17:30 PM By: Linton Ham MD Entered By: Linton Ham on 04/06/2020 11:08:34 -------------------------------------------------------------------------------- Physician Orders Details Patient Name: Date of Service: Nathan Miller, [place] UL D. 04/06/2020 9:30 A M Medical Record Number: [id number] Patient Account Number: [id number] Date of Birth/Sex: Treating RN: February 23, 1951 (68 y.o. 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 Dressing Change Frequency Wound #3 Right,Plantar Calcaneus Change dressing every  day. Wound Cleansing Wound #3 Right,Plantar Calcaneus May shower and wash wound with soap and water. - on days that dressing is changed Primary Wound Dressing Wound #3 Right,Plantar Calcaneus Calcium Alginate with Silver Secondary Dressing Wound #3 Right,Plantar Calcaneus Foam - foam donut Kerlix/Rolled Gauze - secure with tape Dry Gauze Other: - ace wrap over dressing Off-Loading Turn and reposition every 2 hours Other: - float heels off of bed/chair with pillow under calves Electronic Signature(s) Signed: 04/06/2020 4:34:33 PM By: Carlene Coria RN Signed: 04/07/2020 4:17:30 PM By: Linton Ham MD Signed: 04/07/2020 4:17:30 PM By: Linton Ham MD Entered By: Carlene Coria on 04/06/2020 11:06:29 -------------------------------------------------------------------------------- Problem List Details Patient Name: Date of Service: Nathan Moll, PA UL D. 04/06/2020 9:30 A M Medical Record Number: [id number] Patient Account Number: [id number] Date of Birth/Sex: Treating RN: 1950-11-10 (68 y.o. Nathan Miller Primary Care Provider: PA Haig Prophet, [place] Other Clinician: Referring Provider: Treating Provider/Extender: Arta Silence in Treatment: 14 Active Problems ICD-10 Encounter Code Description Active Date MDM Diagnosis E11.621 Type 2 diabetes mellitus with foot ulcer 12/29/2019 No Yes 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 of other part of right foot with fat layer exposed 12/29/2019 No Yes E11.42 Type 2 diabetes mellitus with diabetic polyneuropathy 12/29/2019 No Yes M14.671 Charcot's joint, right ankle and foot 12/29/2019 No Yes Inactive Problems Resolved Problems Electronic Signature(s) Signed: 04/07/2020 4:17:30 PM By: Linton Ham MD Entered By: Linton Ham on 04/06/2020 11:06:16 -------------------------------------------------------------------------------- Progress Note Details Patient Name: Date of Service: Nathan Moll, PA UL D. 04/06/2020 9:30 A M Medical Record  Number: [id number] Patient Account Number: [id number] Date of Birth/Sex: Treating RN: Mar 09, 1951 (68 y.o. Nathan Miller) Carlene Coria Primary Care Provider: PA Haig Prophet, [place] Other Clinician: Referring Provider: Treating Provider/Extender: Arta Silence in Treatment: 14 Subjective History of Present Illness (HPI) The following HPI elements were documented for the patient's wound: Location: right foot Severity: Wag 3 Duration: #months Modifying Factors: Diabetic and charcot' foot.. Peripheral vascular disease Associated Signs and Symptoms: osteo see chief complaint. 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 This is been present for 5-6 years.  Temperature is normal and within the target range for the patient.[name] Appears in no distress. Cardiovascular Pedal pulses are palpable. Notes Wound exam; area questions on the right lateral plantar heel. Slightly smaller. Debrided of some overlying skin from the lateral part of the wound. No bleeding was noted. There is no evidence of infection he does have significant edema around the wound. I asked him to Ace wrap this last time he says he is doing it. There is no evidence of infection Electronic Signature(s) Signed: 04/07/2020 4:17:30 PM By: Linton Ham MD Entered By: Linton Ham on 04/06/2020 11:08:34 -------------------------------------------------------------------------------- Physician Orders Details Patient Name: Date of Service: Nathan Miller, [place] D. 04/06/2020 9:30 A M Medical Record Number: [id number] Patient Account Number: [id number] Date of Birth/Sex: Treating RN: February 23, 1951 (68 y.o. 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 Dressing Change Frequency Wound #3 Right,Plantar Calcaneus Change dressing every  day. Wound Cleansing Wound #3 Right,Plantar Calcaneus May shower and wash wound with soap and water. - on days that dressing is changed Primary Wound Dressing Wound #3 Right,Plantar Calcaneus Calcium Alginate with Silver Secondary Dressing Wound #3 Right,Plantar Calcaneus Foam - foam donut Kerlix/Rolled Gauze - secure with tape Dry Gauze Other: - ace wrap over dressing Off-Loading Turn and reposition every 2 hours Other: - float heels off of bed/chair with pillow under calves Electronic Signature(s) Signed: 04/06/2020 4:34:33 PM By: Carlene Coria RN Signed: 04/07/2020 4:17:30 PM By: Linton Ham MD Signed: 04/07/2020 4:17:30 PM By: Linton Ham MD Entered By: Carlene Coria on 04/06/2020 11:06:29 -------------------------------------------------------------------------------- Problem List Details Patient Name: Date of Service: Nathan Moll, PA UL D. 04/06/2020 9:30 A M Medical Record Number: [id number] Patient Account Number: [id number] Date of Birth/Sex: Treating RN: 1950-11-10 (68 y.o. Nathan Miller Primary Care Provider: PA Haig Prophet, [place] Other Clinician: Referring Provider: Treating Provider/Extender: Arta Silence in Treatment: 14 Active Problems ICD-10 Encounter Code Description Active Date MDM Diagnosis E11.621 Type 2 diabetes mellitus with foot ulcer 12/29/2019 No Yes 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 of other part of right foot with fat layer exposed 12/29/2019 No Yes E11.42 Type 2 diabetes mellitus with diabetic polyneuropathy 12/29/2019 No Yes M14.671 Charcot's joint, right ankle and foot 12/29/2019 No Yes Inactive Problems Resolved Problems Electronic Signature(s) Signed: 04/07/2020 4:17:30 PM By: Linton Ham MD Entered By: Linton Ham on 04/06/2020 11:06:16 -------------------------------------------------------------------------------- Progress Note Details Patient Name: Date of Service: Nathan Moll, PA UL D. 04/06/2020 9:30 A M Medical Record  Number: [id number] Patient Account Number: [id number] Date of Birth/Sex: Treating RN: Mar 09, 1951 (68 y.o. Nathan Miller) Carlene Coria Primary Care Provider: PA Haig Prophet, [place] Other Clinician: Referring Provider: Treating Provider/Extender: Arta Silence in Treatment: 14 Subjective History of Present Illness (HPI) The following HPI elements were documented for the patient's wound: Location: right foot Severity: Wag 3 Duration: #months Modifying Factors: Diabetic and charcot' foot.. Peripheral vascular disease Associated Signs and Symptoms: osteo see chief complaint. 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 This is been present for 5-6 years.  Wound #3 Right,Plantar Calcaneus: Change dressing every day. Wound Cleansing: Wound #3 Right,Plantar Calcaneus: May shower and wash wound with soap and water. - on days that dressing is changed Primary Wound Dressing: Wound #3 Right,Plantar Calcaneus: Calcium Alginate with Silver Secondary Dressing: Wound #3 Right,Plantar Calcaneus: Foam - foam donut Kerlix/Rolled Gauze - secure with tape Dry Gauze Other: - ace wrap over dressing Off-Loading: Turn and reposition every 2 hours Other: - float heels off of bed/chair with pillow under calves 1. I continued with the silver alginate 2. I asked him to Ace wrap this area snugly so we could control some of the swelling. He says he is doing this. 3. There is no evidence of infection here. 4. There is no way to offload this further. Electronic Signature(s) Signed: 04/07/2020 4:17:30 PM By: Linton Ham MD Entered By: Linton Ham on 04/06/2020 11:09:19 -------------------------------------------------------------------------------- SuperBill Details Patient Name: Date of Service: Nathan Miller, [place] UL D. 04/06/2020 Medical Record Number: [id number] Patient Account Number: [id number] Date of Birth/Sex: Treating RN: May 11, 1951 (68 y.o. 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, right ankle and foot Facility Procedures CPT4 Code: 73710626 Description: 94854 - DEB SUBQ TISSUE 20 SQ CM/< ICD-10 Diagnosis Description E11.621 Type 2 diabetes mellitus with foot ulcer 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 of other part of right foot with fat layer exposed Modifier: Quantity: 1 Physician Procedures : CPT4 Code Description Modifier [id number] 09381 - WC PHYS SUBQ TISS 20 SQ CM ICD-10 Diagnosis Description E11.621 Type 2 diabetes mellitus with foot ulcer 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 of other part of right foot with fat layer exposed Quantity: 1 Electronic Signature(s) Signed: 04/07/2020 4:17:30 PM By: Linton Ham MD Entered By: Linton Ham on 04/06/2020 11:09:37  case this is totally closed. That outcome in itself is really quite amazing READMISSION 12/29/2019 Nathan Miller is a now 69 year old man who is in this clinic many years ago for osteomyelitis and a wound on his right lateral foot. 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 He has a Charcot foot deformity. He had extensive orthopedic surgery on this foot as well removing under the underlying metatarsals that at the time had osteomyelitis. We followed him for a long period palliatively for a deep wound on the right lateral foot. He had refused an amputation probably 8 or 9 years ago and underwent IV antibiotics and hyperbaric oxygen. Miraculously this wound actually healed over I believe in 2019. The patient has a wound on the right lateral plantar heel which is not in the same spot as the previous wound. This is a weightbearing surface. The wound looks fairly benign but there is significant undermining. No erythema. They state that is started as a blister they used leftover silver alginate, Neosporin and trying to offload this as best he can. He has custom-made shoes with an AFO brace on the right. Past medical history is reviewed predominantly coronary artery disease, pacemaker, stent, type 2 diabetes with a recent  hemoglobin A1c of 6.1. ABI in our clinic was 1.0 on the right 3/22; areas on the right lateral heel not any better than last time. He has some undermining medially. This is undoubtedly a weightbearing surface here. This will not be easy to heal. 3/29; this is a patient with type 2 diabetes and her Charcot foot. He has an area on the plantar heel on the right. We have been using silver alginate. He is his own special boot with a brace. 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 Areas on the plantar heel on the right. Improvement in depth. We have been using silver alginate 4/26 Charcot foot. Right plantar heel lateral aspect. No real improvement today. We have been using silver alginate because of drainage. This is the reason he does not want a total contact cast he says the dressing simply needs to be changed daily. He has a new custom shoe with an AFO brace on the right. He states the wound started in his old shoes 5/18; Charcot foot previous foot surgery and probably some degree of localized lymphedema in the right lateral foot. He has specialized shoes with an AFO on this side. There is not an option to offload this wound which is on the right plantar heel lateral aspect. We have been using silver alginate. The wound is somewhat smaller. He does not have an arterial issue 6/8; Charcot foot previous surgery probably some degree of localized lymphedema in the right lateral foot. He has specialized shoes with an AFO brace. Wound is on the right lateral calcaneus. We have been using silver alginate. Dimensions are smaller. Electronic Signature(s) Signed: 04/07/2020 4:17:30 PM By: Linton Ham MD Entered By: Linton Ham on 04/06/2020 11:07:23 -------------------------------------------------------------------------------- Physical Exam Details Patient Name: Date of Service: Nathan Miller, [place] UL D. 04/06/2020 9:30 A M Medical Record Number: [id number] Patient Account Number: [id number] Date of  Birth/Sex: Treating RN: 20-Aug-1951 (68 y.o. Nathan Miller Primary Care Provider: PA Haig Prophet, [place] Other Clinician: Referring Provider: Treating Provider/Extender: Arta Silence in Treatment: 10 Constitutional Patient is hypertensive.. Pulse regular and within target range for patient.[name] Respirations regular, non-labored and within target range.[name]  Wound #3 Right,Plantar Calcaneus: Change dressing every day. Wound Cleansing: Wound #3 Right,Plantar Calcaneus: May shower and wash wound with soap and water. - on days that dressing is changed Primary Wound Dressing: Wound #3 Right,Plantar Calcaneus: Calcium Alginate with Silver Secondary Dressing: Wound #3 Right,Plantar Calcaneus: Foam - foam donut Kerlix/Rolled Gauze - secure with tape Dry Gauze Other: - ace wrap over dressing Off-Loading: Turn and reposition every 2 hours Other: - float heels off of bed/chair with pillow under calves 1. I continued with the silver alginate 2. I asked him to Ace wrap this area snugly so we could control some of the swelling. He says he is doing this. 3. There is no evidence of infection here. 4. There is no way to offload this further. Electronic Signature(s) Signed: 04/07/2020 4:17:30 PM By: Linton Ham MD Entered By: Linton Ham on 04/06/2020 11:09:19 -------------------------------------------------------------------------------- SuperBill Details Patient Name: Date of Service: Nathan Miller, [place] UL D. 04/06/2020 Medical Record Number: [id number] Patient Account Number: [id number] Date of Birth/Sex: Treating RN: May 11, 1951 (68 y.o. 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, right ankle and foot Facility Procedures CPT4 Code: 73710626 Description: 94854 - DEB SUBQ TISSUE 20 SQ CM/< ICD-10 Diagnosis Description E11.621 Type 2 diabetes mellitus with foot ulcer 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 of other part of right foot with fat layer exposed Modifier: Quantity: 1 Physician Procedures : CPT4 Code Description Modifier [id number] 09381 - WC PHYS SUBQ TISS 20 SQ CM ICD-10 Diagnosis Description E11.621 Type 2 diabetes mellitus with foot ulcer 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 of other part of right foot with fat layer exposed Quantity: 1 Electronic Signature(s) Signed: 04/07/2020 4:17:30 PM By: Linton Ham MD Entered By: Linton Ham on 04/06/2020 11:09:37  case this is totally closed. That outcome in itself is really quite amazing READMISSION 12/29/2019 Nathan Miller is a now 69 year old man who is in this clinic many years ago for osteomyelitis and a wound on his right lateral foot. 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 He has a Charcot foot deformity. He had extensive orthopedic surgery on this foot as well removing under the underlying metatarsals that at the time had osteomyelitis. We followed him for a long period palliatively for a deep wound on the right lateral foot. He had refused an amputation probably 8 or 9 years ago and underwent IV antibiotics and hyperbaric oxygen. Miraculously this wound actually healed over I believe in 2019. The patient has a wound on the right lateral plantar heel which is not in the same spot as the previous wound. This is a weightbearing surface. The wound looks fairly benign but there is significant undermining. No erythema. They state that is started as a blister they used leftover silver alginate, Neosporin and trying to offload this as best he can. He has custom-made shoes with an AFO brace on the right. Past medical history is reviewed predominantly coronary artery disease, pacemaker, stent, type 2 diabetes with a recent  hemoglobin A1c of 6.1. ABI in our clinic was 1.0 on the right 3/22; areas on the right lateral heel not any better than last time. He has some undermining medially. This is undoubtedly a weightbearing surface here. This will not be easy to heal. 3/29; this is a patient with type 2 diabetes and her Charcot foot. He has an area on the plantar heel on the right. We have been using silver alginate. He is his own special boot with a brace. 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 Areas on the plantar heel on the right. Improvement in depth. We have been using silver alginate 4/26 Charcot foot. Right plantar heel lateral aspect. No real improvement today. We have been using silver alginate because of drainage. This is the reason he does not want a total contact cast he says the dressing simply needs to be changed daily. He has a new custom shoe with an AFO brace on the right. He states the wound started in his old shoes 5/18; Charcot foot previous foot surgery and probably some degree of localized lymphedema in the right lateral foot. He has specialized shoes with an AFO on this side. There is not an option to offload this wound which is on the right plantar heel lateral aspect. We have been using silver alginate. The wound is somewhat smaller. He does not have an arterial issue 6/8; Charcot foot previous surgery probably some degree of localized lymphedema in the right lateral foot. He has specialized shoes with an AFO brace. Wound is on the right lateral calcaneus. We have been using silver alginate. Dimensions are smaller. Electronic Signature(s) Signed: 04/07/2020 4:17:30 PM By: Linton Ham MD Entered By: Linton Ham on 04/06/2020 11:07:23 -------------------------------------------------------------------------------- Physical Exam Details Patient Name: Date of Service: Nathan Miller, [place] UL D. 04/06/2020 9:30 A M Medical Record Number: [id number] Patient Account Number: [id number] Date of  Birth/Sex: Treating RN: 20-Aug-1951 (68 y.o. Nathan Miller Primary Care Provider: PA Haig Prophet, [place] Other Clinician: Referring Provider: Treating Provider/Extender: Arta Silence in Treatment: 10 Constitutional Patient is hypertensive.. Pulse regular and within target range for patient.[name] Respirations regular, non-labored and within target range.[name]

## 2020-04-08 NOTE — Progress Notes (Signed)
Pain Management: Electronic Signature(s) Signed: 04/06/2020 4:34:33 PM By: Carlene Coria RN Signed: 04/08/2020 4:20:07 PM By: Sandre Kitty Entered By: Sandre Kitty on 04/06/2020  10:00:06 -------------------------------------------------------------------------------- Patient/Caregiver Education Details Patient Name: Date of Service: Nathan Moll, PA UL D. 6/8/2021andnbsp9:30 A M Medical Record Number: [id number] Patient Account Number: [id number] Date of Birth/Gender: Treating RN: 1950/12/15 (69 y.o. Nathan Miller) Carlene Coria Primary Care Physician: PA Haig Prophet, [place] Other Clinician: Referring Physician: Treating Physician/Extender: Arta Silence in Treatment: 14 Education Assessment Education Provided To: Patient Education Topics Provided Wound/Skin Impairment: Methods: Explain/Verbal Responses: State content correctly Electronic Signature(s) Signed: 04/06/2020 4:34:33 PM By: Carlene Coria RN Entered By: Carlene Coria on 04/06/2020 10:12:46 -------------------------------------------------------------------------------- Wound Assessment Details Patient Name: Date of Service: Nathan Miller, [place] UL D. 04/06/2020 9:30 A M Medical Record Number: [id number] Patient Account Number: [id number] Date of Birth/Sex: Treating RN: [birth year] (69 y.o. Nathan Miller) Carlene Coria Primary Care Margrete Delude: Other Clinician: PA Haig Prophet, [place] Referring Tycen Dockter: Treating Jayesh Marbach/Extender: Arta Silence in Treatment: 14 Wound Status Wound Number: 3 Primary Diabetic Wound/Ulcer of the Lower Extremity Etiology: Wound Location: Right, Plantar Calcaneus Wound Open Wounding Event: Blister Status: Date Acquired: 09/30/2019 Comorbid Coronary Artery Disease, Hypertension, Type II Diabetes, Weeks Of Treatment: 14 History: Osteoarthritis, Osteomyelitis, Neuropathy, Confinement Anxiety Clustered Wound: No Photos Photo Uploaded By: [name] on 04/07/2020 14:13:48 Wound Measurements Length: (cm) 1.2 % Re Width: (cm) 0.8 % Re Depth: (cm) 0.4 Epit Area: (cm) 0.754 Tun Volume: (cm) 0.302 Und S E M duction in Area: 53.9% duction in Volume: 69.2% helialization:  Small (1-33%) neling: No ermining: Yes tarting Position (o'clock): 6 nding Position (o'clock): 12 aximum Distance: (cm) 0.7 Wound Description Classification: Grade 2 Foul Wound Margin: Thickened Slou Exudate Amount: Medium Exudate Type: Serosanguineous Exudate Color: red, brown Odor After Cleansing: No gh/Fibrino No Wound Bed Granulation Amount: Large (67-100%) Exposed Structure Granulation Quality: Red, Pink Fascia Exposed: No Necrotic Amount: None Present (0%) Fat Layer (Subcutaneous Tissue) Exposed: Yes Tendon Exposed: No Muscle Exposed: No Joint Exposed: No Bone Exposed: No Treatment Notes Wound #3 (Right, Plantar Calcaneus) 1. Cleanse With Wound Cleanser 2. Periwound Care Skin Prep 3. Primary Dressing Applied Calcium Alginate Ag 4. Secondary Dressing Dry Gauze Roll Gauze Foam 5. Secured With Tape 6. Support Layer Tourist information centre manager) Signed: 04/06/2020 4:34:33 PM By: Carlene Coria RN Signed: 04/08/2020 5:18:16 PM By: Levan Hurst RN, BSN Entered By: Levan Hurst on 04/06/2020 10:24:33 -------------------------------------------------------------------------------- Vitals Details Patient Name: Date of Service: Nathan Moll, [place] UL D. 04/06/2020 9:30 A M Medical Record Number: [id number] Patient Account Number: [id number] Date of Birth/Sex: Treating RN: 07/25/1951 (69 y.o. Nathan Miller) Carlene Coria Primary Care Hue Frick: PA Haig Prophet, [place] Other Clinician: Referring Jarrick Fjeld: Treating Cherie Lasalle/Extender: Arta Silence in Treatment: 14 Vital Signs Time Taken: 09:59 Temperature (F): 97.9 Height (in): 68 Pulse (bpm): 66 Weight (lbs): 253 Respiratory Rate (breaths/min): 18 Body Mass Index (BMI): 38.5 Blood Pressure (mmHg): 180/89 Capillary Blood Glucose (mg/dl): 128 Reference Range: 80 - 120 mg / dl Electronic Signature(s) Signed: 04/08/2020 4:20:07 PM By: Sandre Kitty Entered By: Sandre Kitty on  04/06/2020 09:59:53  Pain Management: Electronic Signature(s) Signed: 04/06/2020 4:34:33 PM By: Carlene Coria RN Signed: 04/08/2020 4:20:07 PM By: Sandre Kitty Entered By: Sandre Kitty on 04/06/2020  10:00:06 -------------------------------------------------------------------------------- Patient/Caregiver Education Details Patient Name: Date of Service: Nathan Moll, [place] UL D. 6/8/2021andnbsp9:30 A M Medical Record Number: [id number] Patient Account Number: [id number] Date of Birth/Gender: Treating RN: 1950/12/15 (69 y.o. Nathan Miller) Carlene Coria Primary Care Physician: PA Haig Prophet, [place] Other Clinician: Referring Physician: Treating Physician/Extender: Arta Silence in Treatment: 14 Education Assessment Education Provided To: Patient Education Topics Provided Wound/Skin Impairment: Methods: Explain/Verbal Responses: State content correctly Electronic Signature(s) Signed: 04/06/2020 4:34:33 PM By: Carlene Coria RN Entered By: Carlene Coria on 04/06/2020 10:12:46 -------------------------------------------------------------------------------- Wound Assessment Details Patient Name: Date of Service: Nathan Miller, [place] UL D. 04/06/2020 9:30 A M Medical Record Number: [id number] Patient Account Number: [id number] Date of Birth/Sex: Treating RN: [birth year] (69 y.o. Nathan Miller) Carlene Coria Primary Care Margrete Delude: Other Clinician: PA Haig Prophet, [place] Referring Tycen Dockter: Treating Jayesh Marbach/Extender: Arta Silence in Treatment: 14 Wound Status Wound Number: 3 Primary Diabetic Wound/Ulcer of the Lower Extremity Etiology: Wound Location: Right, Plantar Calcaneus Wound Open Wounding Event: Blister Status: Date Acquired: 09/30/2019 Comorbid Coronary Artery Disease, Hypertension, Type II Diabetes, Weeks Of Treatment: 14 History: Osteoarthritis, Osteomyelitis, Neuropathy, Confinement Anxiety Clustered Wound: No Photos Photo Uploaded By: [name] on 04/07/2020 14:13:48 Wound Measurements Length: (cm) 1.2 % Re Width: (cm) 0.8 % Re Depth: (cm) 0.4 Epit Area: (cm) 0.754 Tun Volume: (cm) 0.302 Und S E M duction in Area: 53.9% duction in Volume: 69.2% helialization:  Small (1-33%) neling: No ermining: Yes tarting Position (o'clock): 6 nding Position (o'clock): 12 aximum Distance: (cm) 0.7 Wound Description Classification: Grade 2 Foul Wound Margin: Thickened Slou Exudate Amount: Medium Exudate Type: Serosanguineous Exudate Color: red, brown Odor After Cleansing: No gh/Fibrino No Wound Bed Granulation Amount: Large (67-100%) Exposed Structure Granulation Quality: Red, Pink Fascia Exposed: No Necrotic Amount: None Present (0%) Fat Layer (Subcutaneous Tissue) Exposed: Yes Tendon Exposed: No Muscle Exposed: No Joint Exposed: No Bone Exposed: No Treatment Notes Wound #3 (Right, Plantar Calcaneus) 1. Cleanse With Wound Cleanser 2. Periwound Care Skin Prep 3. Primary Dressing Applied Calcium Alginate Ag 4. Secondary Dressing Dry Gauze Roll Gauze Foam 5. Secured With Tape 6. Support Layer Tourist information centre manager) Signed: 04/06/2020 4:34:33 PM By: Carlene Coria RN Signed: 04/08/2020 5:18:16 PM By: Levan Hurst RN, BSN Entered By: Levan Hurst on 04/06/2020 10:24:33 -------------------------------------------------------------------------------- Vitals Details Patient Name: Date of Service: Nathan Moll, [place] UL D. 04/06/2020 9:30 A M Medical Record Number: [id number] Patient Account Number: [id number] Date of Birth/Sex: Treating RN: 07/25/1951 (69 y.o. Nathan Miller) Carlene Coria Primary Care Hue Frick: PA Haig Prophet, [place] Other Clinician: Referring Jarrick Fjeld: Treating Cherie Lasalle/Extender: Arta Silence in Treatment: 14 Vital Signs Time Taken: 09:59 Temperature (F): 97.9 Height (in): 68 Pulse (bpm): 66 Weight (lbs): 253 Respiratory Rate (breaths/min): 18 Body Mass Index (BMI): 38.5 Blood Pressure (mmHg): 180/89 Capillary Blood Glucose (mg/dl): 128 Reference Range: 80 - 120 mg / dl Electronic Signature(s) Signed: 04/08/2020 4:20:07 PM By: Sandre Kitty Entered By: Sandre Kitty on  04/06/2020 09:59:53  Date of Service: Nathan Miller, [place] D. 04/06/2020 9:30 A M Medical Record Number: [id number] Patient Account Number: [id number] Date of Birth/Sex: Treating RN: Nov 10, 1950 (69 y.o. Nathan Miller) Carlene Coria Primary Care Maryah Marinaro: PA Haig Prophet, [place] Other Clinician: Referring Kane Kusek: Treating Aimee Heldman/Extender: Arta Silence in Treatment: 14 Vital Signs Height(in): 68 Capillary Blood Glucose(mg/dl): 128 Weight(lbs): 253 Pulse(bpm): 66 Body Mass Index(BMI): 38 Blood Pressure(mmHg): 180/89 Temperature(F): 97.9 Respiratory Rate(breaths/min): 18 Photos: [3:No Photos Right, Plantar Calcaneus] [N/A:N/A N/A] Wound Location: [3:Blister] [N/A:N/A] Wounding Event: [3:Diabetic Wound/Ulcer of the Lower] [N/A:N/A] Primary Etiology: [3:Extremity Coronary Artery Disease,] [N/A:N/A] Comorbid History: [3:Hypertension, Type II Diabetes, Osteoarthritis, Osteomyelitis, Neuropathy, Confinement Anxiety 09/30/2019] [N/A:N/A] Date Acquired: [3:14] [N/A:N/A] Weeks of Treatment: [3:Open] [N/A:N/A] Wound Status: [3:1.2x0.8x0.4] [N/A:N/A] Measurements L x W x D (cm) [3:0.754] [N/A:N/A] A (cm) : rea [3:0.302] [N/A:N/A] Volume (cm) : [3:53.90%] [N/A:N/A] %  Reduction in A [3:rea: 69.20%] [N/A:N/A] % Reduction in Volume: [3:6] Starting Position 1 (o'clock): [3:12] Ending Position 1 (o'clock): [3:0.7] Maximum Distance 1 (cm): [3:Yes] [N/A:N/A] Undermining: [3:Grade 2] [N/A:N/A] Classification: [3:Medium] [N/A:N/A] Exudate A mount: [3:Serosanguineous] [N/A:N/A] Exudate Type: [3:red, brown] [N/A:N/A] Exudate Color: [3:Thickened] [N/A:N/A] Wound Margin: [3:Large (67-100%)] [N/A:N/A] Granulation A mount: [3:Red, Pink] [N/A:N/A] Granulation Quality: [3:None Present (0%)] [N/A:N/A] Necrotic A mount: [3:Fat Layer (Subcutaneous Tissue)] [N/A:N/A] Exposed Structures: [3:Exposed: Yes Fascia: No Tendon: No Muscle: No Joint: No Bone: No Small (1-33%)] [N/A:N/A] Epithelialization: [3:Debridement - Excisional] [N/A:N/A] Debridement: Pre-procedure Verification/Time Out 11:02 [N/A:N/A] Taken: [3:Lidocaine 5% topical ointment] [N/A:N/A] Pain Control: [3:Callus, Subcutaneous] [N/A:N/A] Tissue Debrided: [3:Skin/Subcutaneous Tissue] [N/A:N/A] Level: [3:0.96] [N/A:N/A] Debridement A (sq cm): [3:rea Curette] [N/A:N/A] Instrument: [3:Minimum] [N/A:N/A] Bleeding: [3:Pressure] [N/A:N/A] Hemostasis A chieved: [3:0] [N/A:N/A] Procedural Pain: [3:0] [N/A:N/A] Post Procedural Pain: [3:Procedure was tolerated well] [N/A:N/A] Debridement Treatment Response: [3:1.5x0.8x0.4] [N/A:N/A] Post Debridement Measurements L x W x D (cm) [3:0.377] [N/A:N/A] Post Debridement Volume: (cm) [3:Debridement] [N/A:N/A] Treatment Notes Electronic Signature(s) Signed: 04/06/2020 4:34:33 PM By: Carlene Coria RN Signed: 04/07/2020 4:17:30 PM By: Linton Ham MD Entered By: Linton Ham on 04/06/2020 11:06:26 -------------------------------------------------------------------------------- Multi-Disciplinary Care Plan Details Patient Name: Date of Service: Nathan Moll, [place] D. 04/06/2020 9:30 A M Medical Record Number: [id number] Patient Account Number: [id number] Date of  Birth/Sex: Treating RN: Aug 10, 1951 (69 y.o. Nathan Miller) Carlene Coria Primary Care Lorriann Hansmann: PA Haig Prophet, [place] Other Clinician: Referring Govanni Plemons: Treating Zynia Wojtowicz/Extender: Arta Silence in Treatment: 14 Active Inactive Wound/Skin Impairment Nursing Diagnoses: Impaired tissue integrity Knowledge deficit related to ulceration/compromised skin integrity Goals: Patient/caregiver will verbalize understanding of skin care regimen Date Initiated: 12/29/2019 Target Resolution Date: 04/16/2020 Goal Status: Active Ulcer/skin breakdown will have a volume reduction of 30% by week 4 Date Initiated: 12/29/2019 Date Inactivated: 02/09/2020 Target Resolution Date: 01/30/2020 Goal Status: Met Interventions: Assess patient/caregiver ability to obtain necessary supplies Assess patient/caregiver ability to perform ulcer/skin care regimen upon admission and as needed Assess ulceration(s) every visit Provide education on ulcer and skin care Notes: Electronic Signature(s) Signed: 04/06/2020 4:34:33 PM By: Carlene Coria RN Entered By: Carlene Coria on 04/06/2020 10:12:10 -------------------------------------------------------------------------------- Pain Assessment Details Patient Name: Date of Service: Nathan Miller, [place] UL D. 04/06/2020 9:30 A M Medical Record Number: [id number] Patient Account Number: [id number] Date of Birth/Sex: Treating RN: [birth year] (69 y.o. Nathan Miller) Carlene Coria Primary Care Tatumn Corbridge: PA Haig Prophet, [place] Other Clinician: Referring Nai Borromeo: Treating Treasa Bradshaw/Extender: Arta Silence in Treatment: 14 Active Problems Location of Pain Severity and Description of Pain Patient Has Paino No Patient Has Paino No Site Locations Pain Management and Medication Current

## 2020-04-27 DIAGNOSIS — Z0000 Encounter for general adult medical examination without abnormal findings: Secondary | ICD-10-CM | POA: Diagnosis not present

## 2020-04-27 DIAGNOSIS — M86671 Other chronic osteomyelitis, right ankle and foot: Secondary | ICD-10-CM | POA: Diagnosis not present

## 2020-04-27 DIAGNOSIS — I10 Essential (primary) hypertension: Secondary | ICD-10-CM | POA: Diagnosis not present

## 2020-04-27 DIAGNOSIS — E669 Obesity, unspecified: Secondary | ICD-10-CM | POA: Diagnosis not present

## 2020-04-27 DIAGNOSIS — I25119 Atherosclerotic heart disease of native coronary artery with unspecified angina pectoris: Secondary | ICD-10-CM | POA: Diagnosis not present

## 2020-04-27 DIAGNOSIS — Z23 Encounter for immunization: Secondary | ICD-10-CM | POA: Diagnosis not present

## 2020-04-27 DIAGNOSIS — Z1159 Encounter for screening for other viral diseases: Secondary | ICD-10-CM | POA: Diagnosis not present

## 2020-04-27 DIAGNOSIS — E11622 Type 2 diabetes mellitus with other skin ulcer: Secondary | ICD-10-CM | POA: Diagnosis not present

## 2020-04-27 DIAGNOSIS — E782 Mixed hyperlipidemia: Secondary | ICD-10-CM | POA: Diagnosis not present

## 2020-04-27 DIAGNOSIS — Z7984 Long term (current) use of oral hypoglycemic drugs: Secondary | ICD-10-CM | POA: Diagnosis not present

## 2020-04-27 DIAGNOSIS — Z125 Encounter for screening for malignant neoplasm of prostate: Secondary | ICD-10-CM | POA: Diagnosis not present

## 2020-05-04 ENCOUNTER — Encounter (HOSPITAL_BASED_OUTPATIENT_CLINIC_OR_DEPARTMENT_OTHER): Payer: PPO | Attending: Internal Medicine | Admitting: Internal Medicine

## 2020-05-04 DIAGNOSIS — E11621 Type 2 diabetes mellitus with foot ulcer: Secondary | ICD-10-CM | POA: Diagnosis not present

## 2020-05-04 DIAGNOSIS — E1142 Type 2 diabetes mellitus with diabetic polyneuropathy: Secondary | ICD-10-CM | POA: Diagnosis not present

## 2020-05-04 DIAGNOSIS — E11610 Type 2 diabetes mellitus with diabetic neuropathic arthropathy: Secondary | ICD-10-CM | POA: Diagnosis not present

## 2020-05-04 DIAGNOSIS — L97412 Non-pressure chronic ulcer of right heel and midfoot with fat layer exposed: Secondary | ICD-10-CM | POA: Diagnosis not present

## 2020-05-04 DIAGNOSIS — L97512 Non-pressure chronic ulcer of other part of right foot with fat layer exposed: Secondary | ICD-10-CM | POA: Insufficient documentation

## 2020-05-04 NOTE — Progress Notes (Signed)
AHAD, COLARUSSO ([id number]) Visit Report for 05/04/2020 Arrival Information Details Patient Name: Date of Service: Nathan Miller, [place] D. 05/04/2020 9:15 A M Medical Record Number: [id number] Patient Account Number: [id number] Date of Birth/Sex: Treating RN: [birth year] (69 y.o. [name] Primary Care Daysia Vandenboom: PA Haig Prophet, NO Other Clinician: Referring Irania Durell: Treating Davonna Ertl/Extender: Arta Silence in Treatment: 18 Visit Information History Since Last Visit Added or deleted any medications: No Patient Arrived: Cane Any new allergies or adverse reactions: No Arrival Time: 09:29 Had a fall or experienced change in No Accompanied By: self activities of daily living that may affect Transfer Assistance: None risk of falls: Patient Identification Verified: Yes Signs or symptoms of abuse/neglect since last visito No Secondary Verification Process Completed: Yes Hospitalized since last visit: No Patient Requires Transmission-Based Precautions: No Implantable device outside of the clinic excluding No Patient Has Alerts: No cellular tissue based products placed in the center since last visit: Has Dressing in Place as Prescribed: Yes Pain Present Now: No Electronic Signature(s) Signed: 05/04/2020 5:18:40 PM By: Kela Millin Entered By: Kela Millin on 05/04/2020 09:43:36 -------------------------------------------------------------------------------- Encounter Discharge Information Details Patient Name: Date of Service: Nathan Moll, [place] D. 05/04/2020 9:15 A M Medical Record Number: [id number] Patient Account Number: [id number] Date of Birth/Sex: Treating RN: 1950/12/10 (69 y.o. [name] Primary Care Morgen Linebaugh: PA Haig Prophet, NO Other Clinician: Referring Adell Koval: Treating Mekesha Solomon/Extender: Arta Silence in Treatment: 18 Encounter Discharge Information Items Post Procedure Vitals Discharge Condition: Stable Temperature  (F): 97.8 Ambulatory Status: Cane Pulse (bpm): 72 Discharge Destination: Home Respiratory Rate (breaths/min): 18 Transportation: Private Auto Blood Pressure (mmHg): 176/99 Accompanied By: self Schedule Follow-up Appointment: Yes Clinical Summary of Care: Patient Declined Electronic Signature(s) Signed: 05/04/2020 5:18:40 PM By: Kela Millin Entered By: Kela Millin on 05/04/2020 09:43:27 -------------------------------------------------------------------------------- Lower Extremity Assessment Details Patient Name: Date of Service: Nathan Moll, [place] D. 05/04/2020 9:15 A M Medical Record Number: [id number] Patient Account Number: [id number] Date of Birth/Sex: Treating RN: 14-Jun-1951 (69 y.o. [name] Primary Care Nicolina Hirt: PA Haig Prophet, NO Other Clinician: Referring Jake Fuhrmann: Treating Iyanah Demont/Extender: Arta Silence in Treatment: 18 Edema Assessment Assessed: Shirlyn Goltz: No] Patrice Paradise: No] Edema: [Left: Ye] [Right: s] Calf Left: Right: Point of Measurement: cm From Medial Instep cm 42.5 cm Ankle Left: Right: Point of Measurement: cm From Medial Instep cm 28 cm Vascular Assessment Pulses: Dorsalis Pedis Palpable: [Right:Yes] Electronic Signature(s) Signed: 05/04/2020 5:18:40 PM By: Kela Millin Entered By: Kela Millin on 05/04/2020 09:31:13 -------------------------------------------------------------------------------- Multi Wound Chart Details Patient Name: Date of Service: Nathan Moll, [place] D. 05/04/2020 9:15 A M Medical Record Number: [id number] Patient Account Number: [id number] Date of Birth/Sex: Treating RN: 01-07-1951 (69 y.o. 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] [N/A:N/A] Primary Etiology: [3:Extremity Coronary Artery Disease,] [N/A:N/A] Comorbid History: [3:Hypertension, Type II Diabetes, Osteoarthritis, Osteomyelitis, Neuropathy, Confinement Anxiety 09/30/2019] [N/A:N/A] Date Acquired: [3:18] [N/A:N/A] Weeks of Treatment: [3:Open] [N/A:N/A] Wound Status: [3:0.9x0.5x0.4] [N/A:N/A] Measurements L x W x D (cm) [3:0.353] [N/A:N/A] A (cm) : rea [3:0.141] [N/A:N/A] Volume (cm) : [3:78.40%] [N/A:N/A] % Reduction in A rea: [3:85.60%] [N/A:N/A] % Reduction in Volume: [3:7] Starting Position 1 (o'clock): [3:12]  AHAD, COLARUSSO ([id number]) Visit Report for 05/04/2020 Arrival Information Details Patient Name: Date of Service: Nathan Miller, [place] D. 05/04/2020 9:15 A M Medical Record Number: [id number] Patient Account Number: [id number] Date of Birth/Sex: Treating RN: [birth year] (69 y.o. [name] Primary Care Daysia Vandenboom: PA Haig Prophet, NO Other Clinician: Referring Irania Durell: Treating Davonna Ertl/Extender: Arta Silence in Treatment: 18 Visit Information History Since Last Visit Added or deleted any medications: No Patient Arrived: Cane Any new allergies or adverse reactions: No Arrival Time: 09:29 Had a fall or experienced change in No Accompanied By: self activities of daily living that may affect Transfer Assistance: None risk of falls: Patient Identification Verified: Yes Signs or symptoms of abuse/neglect since last visito No Secondary Verification Process Completed: Yes Hospitalized since last visit: No Patient Requires Transmission-Based Precautions: No Implantable device outside of the clinic excluding No Patient Has Alerts: No cellular tissue based products placed in the center since last visit: Has Dressing in Place as Prescribed: Yes Pain Present Now: No Electronic Signature(s) Signed: 05/04/2020 5:18:40 PM By: Kela Millin Entered By: Kela Millin on 05/04/2020 09:43:36 -------------------------------------------------------------------------------- Encounter Discharge Information Details Patient Name: Date of Service: Nathan Moll, [place] D. 05/04/2020 9:15 A M Medical Record Number: [id number] Patient Account Number: [id number] Date of Birth/Sex: Treating RN: 1950/12/10 (69 y.o. [name] Primary Care Morgen Linebaugh: PA Haig Prophet, NO Other Clinician: Referring Adell Koval: Treating Mekesha Solomon/Extender: Arta Silence in Treatment: 18 Encounter Discharge Information Items Post Procedure Vitals Discharge Condition: Stable Temperature  (F): 97.8 Ambulatory Status: Cane Pulse (bpm): 72 Discharge Destination: Home Respiratory Rate (breaths/min): 18 Transportation: Private Auto Blood Pressure (mmHg): 176/99 Accompanied By: self Schedule Follow-up Appointment: Yes Clinical Summary of Care: Patient Declined Electronic Signature(s) Signed: 05/04/2020 5:18:40 PM By: Kela Millin Entered By: Kela Millin on 05/04/2020 09:43:27 -------------------------------------------------------------------------------- Lower Extremity Assessment Details Patient Name: Date of Service: Nathan Moll, [place] D. 05/04/2020 9:15 A M Medical Record Number: [id number] Patient Account Number: [id number] Date of Birth/Sex: Treating RN: 14-Jun-1951 (69 y.o. [name] Primary Care Nicolina Hirt: PA Haig Prophet, NO Other Clinician: Referring Jake Fuhrmann: Treating Iyanah Demont/Extender: Arta Silence in Treatment: 18 Edema Assessment Assessed: Shirlyn Goltz: No] Patrice Paradise: No] Edema: [Left: Ye] [Right: s] Calf Left: Right: Point of Measurement: cm From Medial Instep cm 42.5 cm Ankle Left: Right: Point of Measurement: cm From Medial Instep cm 28 cm Vascular Assessment Pulses: Dorsalis Pedis Palpable: [Right:Yes] Electronic Signature(s) Signed: 05/04/2020 5:18:40 PM By: Kela Millin Entered By: Kela Millin on 05/04/2020 09:31:13 -------------------------------------------------------------------------------- Multi Wound Chart Details Patient Name: Date of Service: Nathan Moll, [place] D. 05/04/2020 9:15 A M Medical Record Number: [id number] Patient Account Number: [id number] Date of Birth/Sex: Treating RN: 01-07-1951 (69 y.o. 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] [N/A:N/A] Primary Etiology: [3:Extremity Coronary Artery Disease,] [N/A:N/A] Comorbid History: [3:Hypertension, Type II Diabetes, Osteoarthritis, Osteomyelitis, Neuropathy, Confinement Anxiety 09/30/2019] [N/A:N/A] Date Acquired: [3:18] [N/A:N/A] Weeks of Treatment: [3:Open] [N/A:N/A] Wound Status: [3:0.9x0.5x0.4] [N/A:N/A] Measurements L x W x D (cm) [3:0.353] [N/A:N/A] A (cm) : rea [3:0.141] [N/A:N/A] Volume (cm) : [3:78.40%] [N/A:N/A] % Reduction in A rea: [3:85.60%] [N/A:N/A] % Reduction in Volume: [3:7] Starting Position 1 (o'clock): [3:12]  Wound/Ulcer of the Lower Extremity Etiology: Wound Location:  Right, Plantar Calcaneus Wound Open Wounding Event: Blister Status: Date Acquired: 09/30/2019 Comorbid Coronary Artery Disease, Hypertension, Type II Diabetes, Weeks Of Treatment: 18 History: Osteoarthritis, Osteomyelitis, Neuropathy, Confinement Anxiety Clustered Wound: No Photos Photo Uploaded By: [name] on 05/04/2020 13:17:33 Wound Measurements Length: (cm) 0.9 Width: (cm) 0.5 Depth: (cm) 0.4 Area: (cm) 0.353 Volume: (cm) 0.141 % Reduction in Area: 78.4% % Reduction in Volume: 85.6% Epithelialization: Small (1-33%) Tunneling: No Undermining: Yes Starting Position (o'clock): 7 Ending Position (o'clock): 12 Maximum Distance: (cm) 0.9 Wound Description Classification: Grade 2 Wound Margin: Thickened Exudate Amount: Medium Exudate Type: Serosanguineous Exudate Color: red, brown Foul Odor After Cleansing: No Slough/Fibrino No Wound Bed Granulation Amount: Large (67-100%) Exposed Structure Granulation Quality: Red, Pink Fascia Exposed: No Necrotic Amount: None Present (0%) Fat Layer (Subcutaneous Tissue) Exposed: Yes Tendon Exposed: No Muscle Exposed: No Joint Exposed: No Bone Exposed: No Treatment Notes Wound #3 (Right, Plantar Calcaneus) 1. Cleanse With Wound Cleanser 3. Primary Dressing Applied Calcium Alginate Ag 4. Secondary Dressing Dry Gauze Roll Gauze Foam 5. Secured With Tape Notes foam donut Electronic Signature(s) Signed: 05/04/2020 5:18:40 PM By: Kela Millin Signed: 05/04/2020 5:26:11 PM By: Levan Hurst RN, BSN Entered By: Kela Millin on 05/04/2020 09:31:33 -------------------------------------------------------------------------------- [place] Details Patient Name: Date of Service: Nathan Moll, [place] D. 05/04/2020 9:15 A M Medical Record Number: [id number] Patient Account Number: [id number] Date of Birth/Sex: Treating RN: 1951-08-10 (69 y.o. [name] Primary Care Vraj Denardo: PA TIENT, NO Other Clinician: Referring  Jurline Folger: Treating Kileigh Ortmann/Extender: Arta Silence in Treatment: 18 Vital Signs Time Taken: 09:00 Temperature (F): 97.8 Height (in): 68 Pulse (bpm): 72 Weight (lbs): 253 Respiratory Rate (breaths/min): 18 Body Mass Index (BMI): 38.5 Blood Pressure (mmHg): 176/99 Reference Range: 80 - 120 mg / dl Electronic Signature(s) Signed: 05/04/2020 5:18:40 PM By: Kela Millin Entered By: Kela Millin on 05/04/2020 09:30:30

## 2020-05-04 NOTE — Progress Notes (Signed)
BORNA, WESSINGER ([id number]) Visit Report for 05/04/2020 Debridement Details Patient Name: Date of Service: Nathan Miller, [place] D. 05/04/2020 9:15 A M Medical Record Number: [id number] Patient Account Number: [id number] Date of Birth/Sex: Treating RN: [birth year] (69 y.o. [name] Primary Care Provider: PA Haig Prophet, NO Other Clinician: Referring Provider: Treating Provider/Extender: Arta Silence in Treatment: 18 Debridement Performed for Assessment: Wound #3 Right,Plantar Calcaneus Performed By: Physician Ricard Dillon., MD Debridement Type: Debridement Severity of Tissue Pre Debridement: Fat layer exposed Level of Consciousness (Pre-procedure): Awake and Alert Pre-procedure Verification/Time Out Yes - 09:25 Taken: Start Time: 09:25 T Area Debrided (L x W): otal 0.9 (cm) x 0.5 (cm) = 0.45 (cm) Tissue and other material debrided: Viable, Non-Viable, Callus, Subcutaneous, Skin: Epidermis Level: Skin/Subcutaneous Tissue Debridement Description: Excisional Instrument: Curette Bleeding: Moderate Hemostasis Achieved: Silver Nitrate End Time: 09:26 Procedural Pain: 0 Post Procedural Pain: 0 Response to Treatment: Procedure was tolerated well Level of Consciousness (Post- Awake and Alert procedure): Post Debridement Measurements of Total Wound Length: (cm) 0.9 Width: (cm) 0.5 Depth: (cm) 0.4 Volume: (cm) 0.141 Character of Wound/Ulcer Post Debridement: Improved Severity of Tissue Post Debridement: Fat layer exposed Post Procedure Diagnosis Same as Pre-procedure Electronic Signature(s) Signed: 05/04/2020 5:09:59 PM By: Linton Ham MD Signed: 05/04/2020 5:26:11 PM By: Levan Hurst RN, BSN Entered By: Linton Ham on 05/04/2020 09:41:34 -------------------------------------------------------------------------------- HPI Details Patient Name: Date of Service: Nathan Moll, PA UL D. 05/04/2020 9:15 A M Medical Record Number: [id number] Patient Account  Number: [id number] Date of Birth/Sex: Treating RN: 11/03/1950 (69 y.o. [name] Primary Care Provider: PA Haig Prophet, NO Other Clinician: Referring Provider: Treating Provider/Extender: Arta Silence in Treatment: 18 History of Present Illness Location: right foot Severity: Wag 3 Duration: #months Modifying Factors: Diabetic and charcot' foot.. Peripheral vascular disease ssociated Signs and Symptoms: osteo A HPI Description: see chief complaint. 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 This is been present for 5-6 years. He underwent a course of IV any biotics and hyperbaric oxygen roughly 5 years ago. He was offered an amputation by orthopedics and I believe at the time I concurred with this however the patient wishes to up go via wait-and-see approach. He is actually done fairly well over the 5 years is still ambulatory. He uses a silver alginate-based dressing on the foot which he changes himself up. 05/27/15; patient returns for his monthly visit the. The status of his foot and his wound are essentially unchanged this patient has a chronic idiopathic neuropathy. He has underlying osteomyelitis. He had extensive orthopedic surgery roughly 3 or 4 years ago had. He has underlying chronic osteomyelitis.[name] He dresses this with silver alginate. We order the supplies. I continue to see him on a monthly basis for wound debridement. 06/24/15 the patient arrives here today for his monthly wound care check. He has underlying chronic osteomyelitis. The wound was surgically debridement of nonviable circumferential tissue. He is continued with silver alginate based stressing severe this area changed daily by his wife. Post debridement this actually looks as good as I've seen this since quite some time 07/29/15. The patient has been coming here monthly for many years. He has a  chronic wound on his right lateral foot at roughly the mid shaft of his fifth metatarsal. Initially he underwent a code course of IV antibiotics hyperbaric oxygen, resection I believe of his fifth and fourth  Cleansing: Wound #3 Right,Plantar Calcaneus: May shower and wash wound with soap and water. - on days that dressing is changed Primary Wound Dressing: Wound #3 Right,Plantar Calcaneus: Calcium Alginate with Silver Secondary Dressing: Wound #3 Right,Plantar Calcaneus: Foam - foam donut Kerlix/Rolled Gauze - secure with tape Dry Gauze Other: - ace wrap over dressing Off-Loading: Turn and reposition every 2 hours Other: - float heels off of bed/chair with pillow under calves 1. Silver alginate 2. There is no more aggressive way to offload this area. I discussed this with him today. We have tried total contact cast in the past although not in reference to this wound area he did not tolerate these well. Electronic Signature(s) Signed: 05/04/2020 5:09:59 PM By: Linton Ham MD Entered By: Linton Ham on 05/04/2020 09:43:47 -------------------------------------------------------------------------------- SuperBill Details Patient Name:  Date of Service: Nathan Moll, PA UL D. 05/04/2020 Medical Record Number: [id number] Patient Account Number: [id number] Date of Birth/Sex: Treating RN: 12/12/1950 (69 y.o. [name] Primary Care Provider: PA TIENT, NO Other Clinician: Referring Provider: Treating Provider/Extender: Arta Silence in Treatment: 18 Diagnosis Coding ICD-10 Codes Code Description E11.621 Type 2 diabetes mellitus with foot ulcer 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 of other part of right foot with fat layer exposed E11.42 Type 2 diabetes mellitus with diabetic polyneuropathy M14.671 Charcot's joint, right ankle and foot Facility Procedures CPT4 Code: 15176160 Description: 73710 - DEB SUBQ TISSUE 20 SQ CM/< ICD-10 Diagnosis Description 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 of other part of right foot with fat layer exposed E11.621 Type 2 diabetes mellitus with foot ulcer Modifier: Quantity: 1 Physician Procedures : CPT4 Code Description Modifier [id number] - WC PHYS SUBQ TISS 20 SQ CM ICD-10 Diagnosis Description 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 of other part of right foot with fat layer exposed E11.621 Type 2 diabetes mellitus with foot ulcer Quantity: 1 Electronic Signature(s) Signed: 05/04/2020 5:09:59 PM By: Linton Ham MD Entered By: Linton Ham on 05/04/2020 09:44:09  BORNA, WESSINGER ([id number]) Visit Report for 05/04/2020 Debridement Details Patient Name: Date of Service: Nathan Miller, [place] D. 05/04/2020 9:15 A M Medical Record Number: [id number] Patient Account Number: [id number] Date of Birth/Sex: Treating RN: [birth year] (69 y.o. [name] Primary Care Provider: PA Haig Prophet, NO Other Clinician: Referring Provider: Treating Provider/Extender: Arta Silence in Treatment: 18 Debridement Performed for Assessment: Wound #3 Right,Plantar Calcaneus Performed By: Physician Ricard Dillon., MD Debridement Type: Debridement Severity of Tissue Pre Debridement: Fat layer exposed Level of Consciousness (Pre-procedure): Awake and Alert Pre-procedure Verification/Time Out Yes - 09:25 Taken: Start Time: 09:25 T Area Debrided (L x W): otal 0.9 (cm) x 0.5 (cm) = 0.45 (cm) Tissue and other material debrided: Viable, Non-Viable, Callus, Subcutaneous, Skin: Epidermis Level: Skin/Subcutaneous Tissue Debridement Description: Excisional Instrument: Curette Bleeding: Moderate Hemostasis Achieved: Silver Nitrate End Time: 09:26 Procedural Pain: 0 Post Procedural Pain: 0 Response to Treatment: Procedure was tolerated well Level of Consciousness (Post- Awake and Alert procedure): Post Debridement Measurements of Total Wound Length: (cm) 0.9 Width: (cm) 0.5 Depth: (cm) 0.4 Volume: (cm) 0.141 Character of Wound/Ulcer Post Debridement: Improved Severity of Tissue Post Debridement: Fat layer exposed Post Procedure Diagnosis Same as Pre-procedure Electronic Signature(s) Signed: 05/04/2020 5:09:59 PM By: Linton Ham MD Signed: 05/04/2020 5:26:11 PM By: Levan Hurst RN, BSN Entered By: Linton Ham on 05/04/2020 09:41:34 -------------------------------------------------------------------------------- HPI Details Patient Name: Date of Service: Nathan Moll, PA UL D. 05/04/2020 9:15 A M Medical Record Number: [id number] Patient Account  Number: [id number] Date of Birth/Sex: Treating RN: 11/03/1950 (69 y.o. [name] Primary Care Provider: PA Haig Prophet, NO Other Clinician: Referring Provider: Treating Provider/Extender: Arta Silence in Treatment: 18 History of Present Illness Location: right foot Severity: Wag 3 Duration: #months Modifying Factors: Diabetic and charcot' foot.. Peripheral vascular disease ssociated Signs and Symptoms: osteo A HPI Description: see chief complaint. 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 This is been present for 5-6 years. He underwent a course of IV any biotics and hyperbaric oxygen roughly 5 years ago. He was offered an amputation by orthopedics and I believe at the time I concurred with this however the patient wishes to up go via wait-and-see approach. He is actually done fairly well over the 5 years is still ambulatory. He uses a silver alginate-based dressing on the foot which he changes himself up. 05/27/15; patient returns for his monthly visit the. The status of his foot and his wound are essentially unchanged this patient has a chronic idiopathic neuropathy. He has underlying osteomyelitis. He had extensive orthopedic surgery roughly 3 or 4 years ago had. He has underlying chronic osteomyelitis.[name] He dresses this with silver alginate. We order the supplies. I continue to see him on a monthly basis for wound debridement. 06/24/15 the patient arrives here today for his monthly wound care check. He has underlying chronic osteomyelitis. The wound was surgically debridement of nonviable circumferential tissue. He is continued with silver alginate based stressing severe this area changed daily by his wife. Post debridement this actually looks as good as I've seen this since quite some time 07/29/15. The patient has been coming here monthly for many years. He has a  chronic wound on his right lateral foot at roughly the mid shaft of his fifth metatarsal. Initially he underwent a code course of IV antibiotics hyperbaric oxygen, resection I believe of his fifth and fourth  BORNA, WESSINGER ([id number]) Visit Report for 05/04/2020 Debridement Details Patient Name: Date of Service: Nathan Miller, [place] D. 05/04/2020 9:15 A M Medical Record Number: [id number] Patient Account Number: [id number] Date of Birth/Sex: Treating RN: [birth year] (69 y.o. [name] Primary Care Provider: PA Haig Prophet, NO Other Clinician: Referring Provider: Treating Provider/Extender: Arta Silence in Treatment: 18 Debridement Performed for Assessment: Wound #3 Right,Plantar Calcaneus Performed By: Physician Ricard Dillon., MD Debridement Type: Debridement Severity of Tissue Pre Debridement: Fat layer exposed Level of Consciousness (Pre-procedure): Awake and Alert Pre-procedure Verification/Time Out Yes - 09:25 Taken: Start Time: 09:25 T Area Debrided (L x W): otal 0.9 (cm) x 0.5 (cm) = 0.45 (cm) Tissue and other material debrided: Viable, Non-Viable, Callus, Subcutaneous, Skin: Epidermis Level: Skin/Subcutaneous Tissue Debridement Description: Excisional Instrument: Curette Bleeding: Moderate Hemostasis Achieved: Silver Nitrate End Time: 09:26 Procedural Pain: 0 Post Procedural Pain: 0 Response to Treatment: Procedure was tolerated well Level of Consciousness (Post- Awake and Alert procedure): Post Debridement Measurements of Total Wound Length: (cm) 0.9 Width: (cm) 0.5 Depth: (cm) 0.4 Volume: (cm) 0.141 Character of Wound/Ulcer Post Debridement: Improved Severity of Tissue Post Debridement: Fat layer exposed Post Procedure Diagnosis Same as Pre-procedure Electronic Signature(s) Signed: 05/04/2020 5:09:59 PM By: Linton Ham MD Signed: 05/04/2020 5:26:11 PM By: Levan Hurst RN, BSN Entered By: Linton Ham on 05/04/2020 09:41:34 -------------------------------------------------------------------------------- HPI Details Patient Name: Date of Service: Nathan Moll, PA UL D. 05/04/2020 9:15 A M Medical Record Number: [id number] Patient Account  Number: [id number] Date of Birth/Sex: Treating RN: 11/03/1950 (69 y.o. [name] Primary Care Provider: PA Haig Prophet, NO Other Clinician: Referring Provider: Treating Provider/Extender: Arta Silence in Treatment: 18 History of Present Illness Location: right foot Severity: Wag 3 Duration: #months Modifying Factors: Diabetic and charcot' foot.. Peripheral vascular disease ssociated Signs and Symptoms: osteo A HPI Description: see chief complaint. 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 This is been present for 5-6 years. He underwent a course of IV any biotics and hyperbaric oxygen roughly 5 years ago. He was offered an amputation by orthopedics and I believe at the time I concurred with this however the patient wishes to up go via wait-and-see approach. He is actually done fairly well over the 5 years is still ambulatory. He uses a silver alginate-based dressing on the foot which he changes himself up. 05/27/15; patient returns for his monthly visit the. The status of his foot and his wound are essentially unchanged this patient has a chronic idiopathic neuropathy. He has underlying osteomyelitis. He had extensive orthopedic surgery roughly 3 or 4 years ago had. He has underlying chronic osteomyelitis.[name] He dresses this with silver alginate. We order the supplies. I continue to see him on a monthly basis for wound debridement. 06/24/15 the patient arrives here today for his monthly wound care check. He has underlying chronic osteomyelitis. The wound was surgically debridement of nonviable circumferential tissue. He is continued with silver alginate based stressing severe this area changed daily by his wife. Post debridement this actually looks as good as I've seen this since quite some time 07/29/15. The patient has been coming here monthly for many years. He has a  chronic wound on his right lateral foot at roughly the mid shaft of his fifth metatarsal. Initially he underwent a code course of IV antibiotics hyperbaric oxygen, resection I believe of his fifth and fourth  BORNA, WESSINGER ([id number]) Visit Report for 05/04/2020 Debridement Details Patient Name: Date of Service: Nathan Miller, [place] D. 05/04/2020 9:15 A M Medical Record Number: [id number] Patient Account Number: [id number] Date of Birth/Sex: Treating RN: [birth year] (69 y.o. [name] Primary Care Provider: PA Haig Prophet, NO Other Clinician: Referring Provider: Treating Provider/Extender: Arta Silence in Treatment: 18 Debridement Performed for Assessment: Wound #3 Right,Plantar Calcaneus Performed By: Physician Ricard Dillon., MD Debridement Type: Debridement Severity of Tissue Pre Debridement: Fat layer exposed Level of Consciousness (Pre-procedure): Awake and Alert Pre-procedure Verification/Time Out Yes - 09:25 Taken: Start Time: 09:25 T Area Debrided (L x W): otal 0.9 (cm) x 0.5 (cm) = 0.45 (cm) Tissue and other material debrided: Viable, Non-Viable, Callus, Subcutaneous, Skin: Epidermis Level: Skin/Subcutaneous Tissue Debridement Description: Excisional Instrument: Curette Bleeding: Moderate Hemostasis Achieved: Silver Nitrate End Time: 09:26 Procedural Pain: 0 Post Procedural Pain: 0 Response to Treatment: Procedure was tolerated well Level of Consciousness (Post- Awake and Alert procedure): Post Debridement Measurements of Total Wound Length: (cm) 0.9 Width: (cm) 0.5 Depth: (cm) 0.4 Volume: (cm) 0.141 Character of Wound/Ulcer Post Debridement: Improved Severity of Tissue Post Debridement: Fat layer exposed Post Procedure Diagnosis Same as Pre-procedure Electronic Signature(s) Signed: 05/04/2020 5:09:59 PM By: Linton Ham MD Signed: 05/04/2020 5:26:11 PM By: Levan Hurst RN, BSN Entered By: Linton Ham on 05/04/2020 09:41:34 -------------------------------------------------------------------------------- HPI Details Patient Name: Date of Service: Nathan Moll, PA UL D. 05/04/2020 9:15 A M Medical Record Number: [id number] Patient Account  Number: [id number] Date of Birth/Sex: Treating RN: 11/03/1950 (69 y.o. [name] Primary Care Provider: PA Haig Prophet, NO Other Clinician: Referring Provider: Treating Provider/Extender: Arta Silence in Treatment: 18 History of Present Illness Location: right foot Severity: Wag 3 Duration: #months Modifying Factors: Diabetic and charcot' foot.. Peripheral vascular disease ssociated Signs and Symptoms: osteo A HPI Description: see chief complaint. 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 This is been present for 5-6 years. He underwent a course of IV any biotics and hyperbaric oxygen roughly 5 years ago. He was offered an amputation by orthopedics and I believe at the time I concurred with this however the patient wishes to up go via wait-and-see approach. He is actually done fairly well over the 5 years is still ambulatory. He uses a silver alginate-based dressing on the foot which he changes himself up. 05/27/15; patient returns for his monthly visit the. The status of his foot and his wound are essentially unchanged this patient has a chronic idiopathic neuropathy. He has underlying osteomyelitis. He had extensive orthopedic surgery roughly 3 or 4 years ago had. He has underlying chronic osteomyelitis.[name] He dresses this with silver alginate. We order the supplies. I continue to see him on a monthly basis for wound debridement. 06/24/15 the patient arrives here today for his monthly wound care check. He has underlying chronic osteomyelitis. The wound was surgically debridement of nonviable circumferential tissue. He is continued with silver alginate based stressing severe this area changed daily by his wife. Post debridement this actually looks as good as I've seen this since quite some time 07/29/15. The patient has been coming here monthly for many years. He has a  chronic wound on his right lateral foot at roughly the mid shaft of his fifth metatarsal. Initially he underwent a code course of IV antibiotics hyperbaric oxygen, resection I believe of his fifth and fourth  BORNA, WESSINGER ([id number]) Visit Report for 05/04/2020 Debridement Details Patient Name: Date of Service: Nathan Miller, [place] D. 05/04/2020 9:15 A M Medical Record Number: [id number] Patient Account Number: [id number] Date of Birth/Sex: Treating RN: [birth year] (69 y.o. [name] Primary Care Provider: PA Haig Prophet, NO Other Clinician: Referring Provider: Treating Provider/Extender: Arta Silence in Treatment: 18 Debridement Performed for Assessment: Wound #3 Right,Plantar Calcaneus Performed By: Physician Ricard Dillon., MD Debridement Type: Debridement Severity of Tissue Pre Debridement: Fat layer exposed Level of Consciousness (Pre-procedure): Awake and Alert Pre-procedure Verification/Time Out Yes - 09:25 Taken: Start Time: 09:25 T Area Debrided (L x W): otal 0.9 (cm) x 0.5 (cm) = 0.45 (cm) Tissue and other material debrided: Viable, Non-Viable, Callus, Subcutaneous, Skin: Epidermis Level: Skin/Subcutaneous Tissue Debridement Description: Excisional Instrument: Curette Bleeding: Moderate Hemostasis Achieved: Silver Nitrate End Time: 09:26 Procedural Pain: 0 Post Procedural Pain: 0 Response to Treatment: Procedure was tolerated well Level of Consciousness (Post- Awake and Alert procedure): Post Debridement Measurements of Total Wound Length: (cm) 0.9 Width: (cm) 0.5 Depth: (cm) 0.4 Volume: (cm) 0.141 Character of Wound/Ulcer Post Debridement: Improved Severity of Tissue Post Debridement: Fat layer exposed Post Procedure Diagnosis Same as Pre-procedure Electronic Signature(s) Signed: 05/04/2020 5:09:59 PM By: Linton Ham MD Signed: 05/04/2020 5:26:11 PM By: Levan Hurst RN, BSN Entered By: Linton Ham on 05/04/2020 09:41:34 -------------------------------------------------------------------------------- HPI Details Patient Name: Date of Service: Nathan Moll, PA UL D. 05/04/2020 9:15 A M Medical Record Number: [id number] Patient Account  Number: [id number] Date of Birth/Sex: Treating RN: 11/03/1950 (69 y.o. [name] Primary Care Provider: PA Haig Prophet, NO Other Clinician: Referring Provider: Treating Provider/Extender: Arta Silence in Treatment: 18 History of Present Illness Location: right foot Severity: Wag 3 Duration: #months Modifying Factors: Diabetic and charcot' foot.. Peripheral vascular disease ssociated Signs and Symptoms: osteo A HPI Description: see chief complaint. 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 This is been present for 5-6 years. He underwent a course of IV any biotics and hyperbaric oxygen roughly 5 years ago. He was offered an amputation by orthopedics and I believe at the time I concurred with this however the patient wishes to up go via wait-and-see approach. He is actually done fairly well over the 5 years is still ambulatory. He uses a silver alginate-based dressing on the foot which he changes himself up. 05/27/15; patient returns for his monthly visit the. The status of his foot and his wound are essentially unchanged this patient has a chronic idiopathic neuropathy. He has underlying osteomyelitis. He had extensive orthopedic surgery roughly 3 or 4 years ago had. He has underlying chronic osteomyelitis.[name] He dresses this with silver alginate. We order the supplies. I continue to see him on a monthly basis for wound debridement. 06/24/15 the patient arrives here today for his monthly wound care check. He has underlying chronic osteomyelitis. The wound was surgically debridement of nonviable circumferential tissue. He is continued with silver alginate based stressing severe this area changed daily by his wife. Post debridement this actually looks as good as I've seen this since quite some time 07/29/15. The patient has been coming here monthly for many years. He has a  chronic wound on his right lateral foot at roughly the mid shaft of his fifth metatarsal. Initially he underwent a code course of IV antibiotics hyperbaric oxygen, resection I believe of his fifth and fourth  in itself is really quite amazing READMISSION 12/29/2019 Mr. Callanan is a now 69 year old man who is in this clinic many years ago for osteomyelitis and a wound on his right lateral foot. 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 He has a Charcot foot deformity. He had extensive orthopedic surgery on this foot as well removing under the underlying metatarsals that at the time had osteomyelitis. We followed him for a long period palliatively for a deep wound on the right lateral foot. He had refused an amputation probably 8 or 9 years ago and underwent IV antibiotics and hyperbaric oxygen. Miraculously this wound actually healed over I believe in 2019. The patient has a wound on the right lateral plantar heel which is not in the same spot as the previous wound. This is a weightbearing surface. The wound looks fairly benign but there is significant undermining. No erythema. They state that is started as a blister they used leftover silver alginate, Neosporin and trying to offload this as best he can. He has custom-made shoes with an AFO brace on the right. Past medical history is reviewed predominantly coronary artery disease, pacemaker, stent, type 2 diabetes with a recent hemoglobin A1c of 6.1. ABI in our clinic was 1.0 on the right 3/22;  areas on the right lateral heel not any better than last time. He has some undermining medially. This is undoubtedly a weightbearing surface here. This will not be easy to heal. 3/29; this is a patient with type 2 diabetes and her Charcot foot. He has an area on the plantar heel on the right. We have been using silver alginate. He is his own special boot with a brace. 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 Areas on the plantar heel on the right. Improvement in depth. We have been using silver alginate 4/26 Charcot foot. Right plantar heel lateral aspect. No real improvement today. We have been using silver alginate because of drainage. This is the reason he does not want a total contact cast he says the dressing simply needs to be changed daily. He has a new custom shoe with an AFO brace on the right. He states the wound started in his old shoes 5/18; Charcot foot previous foot surgery and probably some degree of localized lymphedema in the right lateral foot. He has specialized shoes with an AFO on this side. There is not an option to offload this wound which is on the right plantar heel lateral aspect. We have been using silver alginate. The wound is somewhat smaller. He does not have an arterial issue 6/8; Charcot foot previous surgery probably some degree of localized lymphedema in the right lateral foot. He has specialized shoes with an AFO brace. Wound is on the right lateral calcaneus. We have been using silver alginate. Dimensions are smaller. 7/6; monthly follow-up. Charcot foot deformity with localized lymphedema in the right lateral foot from previous surgery. He wears specially shoes with an AFO. Wound is on the right lateral calcaneus. Some improvement in the dimensions Electronic Signature(s) Signed: 05/04/2020 5:09:59 PM By: Linton Ham MD Entered By: Linton Ham on 05/04/2020  09:42:11 -------------------------------------------------------------------------------- Physical Exam Details Patient Name: Date of Service: Nathan Moll, PA UL D. 05/04/2020 9:15 A M Medical Record Number: [id number] Patient Account Number: [id number] Date of Birth/Sex: Treating RN: 12-Nov-1950 (69 y.o. [name] Primary Care Provider: PA Darnelle Spangle Other Clinician: Referring Provider:  Cleansing: Wound #3 Right,Plantar Calcaneus: May shower and wash wound with soap and water. - on days that dressing is changed Primary Wound Dressing: Wound #3 Right,Plantar Calcaneus: Calcium Alginate with Silver Secondary Dressing: Wound #3 Right,Plantar Calcaneus: Foam - foam donut Kerlix/Rolled Gauze - secure with tape Dry Gauze Other: - ace wrap over dressing Off-Loading: Turn and reposition every 2 hours Other: - float heels off of bed/chair with pillow under calves 1. Silver alginate 2. There is no more aggressive way to offload this area. I discussed this with him today. We have tried total contact cast in the past although not in reference to this wound area he did not tolerate these well. Electronic Signature(s) Signed: 05/04/2020 5:09:59 PM By: Linton Ham MD Entered By: Linton Ham on 05/04/2020 09:43:47 -------------------------------------------------------------------------------- SuperBill Details Patient Name:  Date of Service: Nathan Moll, PA UL D. 05/04/2020 Medical Record Number: [id number] Patient Account Number: [id number] Date of Birth/Sex: Treating RN: 12/12/1950 (69 y.o. [name] Primary Care Provider: PA TIENT, NO Other Clinician: Referring Provider: Treating Provider/Extender: Arta Silence in Treatment: 18 Diagnosis Coding ICD-10 Codes Code Description E11.621 Type 2 diabetes mellitus with foot ulcer 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 of other part of right foot with fat layer exposed E11.42 Type 2 diabetes mellitus with diabetic polyneuropathy M14.671 Charcot's joint, right ankle and foot Facility Procedures CPT4 Code: 15176160 Description: 73710 - DEB SUBQ TISSUE 20 SQ CM/< ICD-10 Diagnosis Description 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 of other part of right foot with fat layer exposed E11.621 Type 2 diabetes mellitus with foot ulcer Modifier: Quantity: 1 Physician Procedures : CPT4 Code Description Modifier [id number] - WC PHYS SUBQ TISS 20 SQ CM ICD-10 Diagnosis Description 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 of other part of right foot with fat layer exposed E11.621 Type 2 diabetes mellitus with foot ulcer Quantity: 1 Electronic Signature(s) Signed: 05/04/2020 5:09:59 PM By: Linton Ham MD Entered By: Linton Ham on 05/04/2020 09:44:09  in itself is really quite amazing READMISSION 12/29/2019 Mr. Callanan is a now 69 year old man who is in this clinic many years ago for osteomyelitis and a wound on his right lateral foot. 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 He has a Charcot foot deformity. He had extensive orthopedic surgery on this foot as well removing under the underlying metatarsals that at the time had osteomyelitis. We followed him for a long period palliatively for a deep wound on the right lateral foot. He had refused an amputation probably 8 or 9 years ago and underwent IV antibiotics and hyperbaric oxygen. Miraculously this wound actually healed over I believe in 2019. The patient has a wound on the right lateral plantar heel which is not in the same spot as the previous wound. This is a weightbearing surface. The wound looks fairly benign but there is significant undermining. No erythema. They state that is started as a blister they used leftover silver alginate, Neosporin and trying to offload this as best he can. He has custom-made shoes with an AFO brace on the right. Past medical history is reviewed predominantly coronary artery disease, pacemaker, stent, type 2 diabetes with a recent hemoglobin A1c of 6.1. ABI in our clinic was 1.0 on the right 3/22;  areas on the right lateral heel not any better than last time. He has some undermining medially. This is undoubtedly a weightbearing surface here. This will not be easy to heal. 3/29; this is a patient with type 2 diabetes and her Charcot foot. He has an area on the plantar heel on the right. We have been using silver alginate. He is his own special boot with a brace. 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 Areas on the plantar heel on the right. Improvement in depth. We have been using silver alginate 4/26 Charcot foot. Right plantar heel lateral aspect. No real improvement today. We have been using silver alginate because of drainage. This is the reason he does not want a total contact cast he says the dressing simply needs to be changed daily. He has a new custom shoe with an AFO brace on the right. He states the wound started in his old shoes 5/18; Charcot foot previous foot surgery and probably some degree of localized lymphedema in the right lateral foot. He has specialized shoes with an AFO on this side. There is not an option to offload this wound which is on the right plantar heel lateral aspect. We have been using silver alginate. The wound is somewhat smaller. He does not have an arterial issue 6/8; Charcot foot previous surgery probably some degree of localized lymphedema in the right lateral foot. He has specialized shoes with an AFO brace. Wound is on the right lateral calcaneus. We have been using silver alginate. Dimensions are smaller. 7/6; monthly follow-up. Charcot foot deformity with localized lymphedema in the right lateral foot from previous surgery. He wears specially shoes with an AFO. Wound is on the right lateral calcaneus. Some improvement in the dimensions Electronic Signature(s) Signed: 05/04/2020 5:09:59 PM By: Linton Ham MD Entered By: Linton Ham on 05/04/2020  09:42:11 -------------------------------------------------------------------------------- Physical Exam Details Patient Name: Date of Service: Nathan Moll, PA UL D. 05/04/2020 9:15 A M Medical Record Number: [id number] Patient Account Number: [id number] Date of Birth/Sex: Treating RN: 12-Nov-1950 (69 y.o. [name] Primary Care Provider: PA Darnelle Spangle Other Clinician: Referring Provider:  in itself is really quite amazing READMISSION 12/29/2019 Mr. Callanan is a now 69 year old man who is in this clinic many years ago for osteomyelitis and a wound on his right lateral foot. 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 He has a Charcot foot deformity. He had extensive orthopedic surgery on this foot as well removing under the underlying metatarsals that at the time had osteomyelitis. We followed him for a long period palliatively for a deep wound on the right lateral foot. He had refused an amputation probably 8 or 9 years ago and underwent IV antibiotics and hyperbaric oxygen. Miraculously this wound actually healed over I believe in 2019. The patient has a wound on the right lateral plantar heel which is not in the same spot as the previous wound. This is a weightbearing surface. The wound looks fairly benign but there is significant undermining. No erythema. They state that is started as a blister they used leftover silver alginate, Neosporin and trying to offload this as best he can. He has custom-made shoes with an AFO brace on the right. Past medical history is reviewed predominantly coronary artery disease, pacemaker, stent, type 2 diabetes with a recent hemoglobin A1c of 6.1. ABI in our clinic was 1.0 on the right 3/22;  areas on the right lateral heel not any better than last time. He has some undermining medially. This is undoubtedly a weightbearing surface here. This will not be easy to heal. 3/29; this is a patient with type 2 diabetes and her Charcot foot. He has an area on the plantar heel on the right. We have been using silver alginate. He is his own special boot with a brace. 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 Areas on the plantar heel on the right. Improvement in depth. We have been using silver alginate 4/26 Charcot foot. Right plantar heel lateral aspect. No real improvement today. We have been using silver alginate because of drainage. This is the reason he does not want a total contact cast he says the dressing simply needs to be changed daily. He has a new custom shoe with an AFO brace on the right. He states the wound started in his old shoes 5/18; Charcot foot previous foot surgery and probably some degree of localized lymphedema in the right lateral foot. He has specialized shoes with an AFO on this side. There is not an option to offload this wound which is on the right plantar heel lateral aspect. We have been using silver alginate. The wound is somewhat smaller. He does not have an arterial issue 6/8; Charcot foot previous surgery probably some degree of localized lymphedema in the right lateral foot. He has specialized shoes with an AFO brace. Wound is on the right lateral calcaneus. We have been using silver alginate. Dimensions are smaller. 7/6; monthly follow-up. Charcot foot deformity with localized lymphedema in the right lateral foot from previous surgery. He wears specially shoes with an AFO. Wound is on the right lateral calcaneus. Some improvement in the dimensions Electronic Signature(s) Signed: 05/04/2020 5:09:59 PM By: Linton Ham MD Entered By: Linton Ham on 05/04/2020  09:42:11 -------------------------------------------------------------------------------- Physical Exam Details Patient Name: Date of Service: Nathan Moll, PA UL D. 05/04/2020 9:15 A M Medical Record Number: [id number] Patient Account Number: [id number] Date of Birth/Sex: Treating RN: 12-Nov-1950 (69 y.o. [name] Primary Care Provider: PA Darnelle Spangle Other Clinician: Referring Provider:  BORNA, WESSINGER ([id number]) Visit Report for 05/04/2020 Debridement Details Patient Name: Date of Service: Nathan Miller, [place] D. 05/04/2020 9:15 A M Medical Record Number: [id number] Patient Account Number: [id number] Date of Birth/Sex: Treating RN: [birth year] (69 y.o. [name] Primary Care Provider: PA Haig Prophet, NO Other Clinician: Referring Provider: Treating Provider/Extender: Arta Silence in Treatment: 18 Debridement Performed for Assessment: Wound #3 Right,Plantar Calcaneus Performed By: Physician Ricard Dillon., MD Debridement Type: Debridement Severity of Tissue Pre Debridement: Fat layer exposed Level of Consciousness (Pre-procedure): Awake and Alert Pre-procedure Verification/Time Out Yes - 09:25 Taken: Start Time: 09:25 T Area Debrided (L x W): otal 0.9 (cm) x 0.5 (cm) = 0.45 (cm) Tissue and other material debrided: Viable, Non-Viable, Callus, Subcutaneous, Skin: Epidermis Level: Skin/Subcutaneous Tissue Debridement Description: Excisional Instrument: Curette Bleeding: Moderate Hemostasis Achieved: Silver Nitrate End Time: 09:26 Procedural Pain: 0 Post Procedural Pain: 0 Response to Treatment: Procedure was tolerated well Level of Consciousness (Post- Awake and Alert procedure): Post Debridement Measurements of Total Wound Length: (cm) 0.9 Width: (cm) 0.5 Depth: (cm) 0.4 Volume: (cm) 0.141 Character of Wound/Ulcer Post Debridement: Improved Severity of Tissue Post Debridement: Fat layer exposed Post Procedure Diagnosis Same as Pre-procedure Electronic Signature(s) Signed: 05/04/2020 5:09:59 PM By: Linton Ham MD Signed: 05/04/2020 5:26:11 PM By: Levan Hurst RN, BSN Entered By: Linton Ham on 05/04/2020 09:41:34 -------------------------------------------------------------------------------- HPI Details Patient Name: Date of Service: Nathan Moll, PA UL D. 05/04/2020 9:15 A M Medical Record Number: [id number] Patient Account  Number: [id number] Date of Birth/Sex: Treating RN: 11/03/1950 (69 y.o. [name] Primary Care Provider: PA Haig Prophet, NO Other Clinician: Referring Provider: Treating Provider/Extender: Arta Silence in Treatment: 18 History of Present Illness Location: right foot Severity: Wag 3 Duration: #months Modifying Factors: Diabetic and charcot' foot.. Peripheral vascular disease ssociated Signs and Symptoms: osteo A HPI Description: see chief complaint. 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 This is been present for 5-6 years. He underwent a course of IV any biotics and hyperbaric oxygen roughly 5 years ago. He was offered an amputation by orthopedics and I believe at the time I concurred with this however the patient wishes to up go via wait-and-see approach. He is actually done fairly well over the 5 years is still ambulatory. He uses a silver alginate-based dressing on the foot which he changes himself up. 05/27/15; patient returns for his monthly visit the. The status of his foot and his wound are essentially unchanged this patient has a chronic idiopathic neuropathy. He has underlying osteomyelitis. He had extensive orthopedic surgery roughly 3 or 4 years ago had. He has underlying chronic osteomyelitis.[name] He dresses this with silver alginate. We order the supplies. I continue to see him on a monthly basis for wound debridement. 06/24/15 the patient arrives here today for his monthly wound care check. He has underlying chronic osteomyelitis. The wound was surgically debridement of nonviable circumferential tissue. He is continued with silver alginate based stressing severe this area changed daily by his wife. Post debridement this actually looks as good as I've seen this since quite some time 07/29/15. The patient has been coming here monthly for many years. He has a  chronic wound on his right lateral foot at roughly the mid shaft of his fifth metatarsal. Initially he underwent a code course of IV antibiotics hyperbaric oxygen, resection I believe of his fifth and fourth  BORNA, WESSINGER ([id number]) Visit Report for 05/04/2020 Debridement Details Patient Name: Date of Service: Nathan Miller, [place] D. 05/04/2020 9:15 A M Medical Record Number: [id number] Patient Account Number: [id number] Date of Birth/Sex: Treating RN: [birth year] (69 y.o. [name] Primary Care Provider: PA Haig Prophet, NO Other Clinician: Referring Provider: Treating Provider/Extender: Arta Silence in Treatment: 18 Debridement Performed for Assessment: Wound #3 Right,Plantar Calcaneus Performed By: Physician Ricard Dillon., MD Debridement Type: Debridement Severity of Tissue Pre Debridement: Fat layer exposed Level of Consciousness (Pre-procedure): Awake and Alert Pre-procedure Verification/Time Out Yes - 09:25 Taken: Start Time: 09:25 T Area Debrided (L x W): otal 0.9 (cm) x 0.5 (cm) = 0.45 (cm) Tissue and other material debrided: Viable, Non-Viable, Callus, Subcutaneous, Skin: Epidermis Level: Skin/Subcutaneous Tissue Debridement Description: Excisional Instrument: Curette Bleeding: Moderate Hemostasis Achieved: Silver Nitrate End Time: 09:26 Procedural Pain: 0 Post Procedural Pain: 0 Response to Treatment: Procedure was tolerated well Level of Consciousness (Post- Awake and Alert procedure): Post Debridement Measurements of Total Wound Length: (cm) 0.9 Width: (cm) 0.5 Depth: (cm) 0.4 Volume: (cm) 0.141 Character of Wound/Ulcer Post Debridement: Improved Severity of Tissue Post Debridement: Fat layer exposed Post Procedure Diagnosis Same as Pre-procedure Electronic Signature(s) Signed: 05/04/2020 5:09:59 PM By: Linton Ham MD Signed: 05/04/2020 5:26:11 PM By: Levan Hurst RN, BSN Entered By: Linton Ham on 05/04/2020 09:41:34 -------------------------------------------------------------------------------- HPI Details Patient Name: Date of Service: Nathan Moll, PA UL D. 05/04/2020 9:15 A M Medical Record Number: [id number] Patient Account  Number: [id number] Date of Birth/Sex: Treating RN: 11/03/1950 (69 y.o. [name] Primary Care Provider: PA Haig Prophet, NO Other Clinician: Referring Provider: Treating Provider/Extender: Arta Silence in Treatment: 18 History of Present Illness Location: right foot Severity: Wag 3 Duration: #months Modifying Factors: Diabetic and charcot' foot.. Peripheral vascular disease ssociated Signs and Symptoms: osteo A HPI Description: see chief complaint. 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 This is been present for 5-6 years. He underwent a course of IV any biotics and hyperbaric oxygen roughly 5 years ago. He was offered an amputation by orthopedics and I believe at the time I concurred with this however the patient wishes to up go via wait-and-see approach. He is actually done fairly well over the 5 years is still ambulatory. He uses a silver alginate-based dressing on the foot which he changes himself up. 05/27/15; patient returns for his monthly visit the. The status of his foot and his wound are essentially unchanged this patient has a chronic idiopathic neuropathy. He has underlying osteomyelitis. He had extensive orthopedic surgery roughly 3 or 4 years ago had. He has underlying chronic osteomyelitis.[name] He dresses this with silver alginate. We order the supplies. I continue to see him on a monthly basis for wound debridement. 06/24/15 the patient arrives here today for his monthly wound care check. He has underlying chronic osteomyelitis. The wound was surgically debridement of nonviable circumferential tissue. He is continued with silver alginate based stressing severe this area changed daily by his wife. Post debridement this actually looks as good as I've seen this since quite some time 07/29/15. The patient has been coming here monthly for many years. He has a  chronic wound on his right lateral foot at roughly the mid shaft of his fifth metatarsal. Initially he underwent a code course of IV antibiotics hyperbaric oxygen, resection I believe of his fifth and fourth  Treating Provider/Extender: Arta Silence in Treatment: 18 Constitutional Patient is hypertensive.. Pulse regular and within target range for patient.[name] Respirations regular, non-labored and within target range.. Temperature is normal and within the target range for the patient.[name] Appears in no distress. Notes Wound exam; area questions on the right lateral plantar heel. Smaller dimensions. Undermining area. Using a #3 curette I remove the skin and subcutaneous tissue from the undermining area. Debris on the wound surface. Hemostasis with silver nitrate. No evidence of infection. Electronic Signature(s) Signed: 05/04/2020 5:09:59 PM By: Linton Ham MD Entered By: Linton Ham on 05/04/2020 09:43:00 -------------------------------------------------------------------------------- Physician Orders Details Patient Name: Date of Service: Nathan Moll, PA UL D. 05/04/2020 9:15 A M Medical Record Number: [id number] Patient Account Number: [id number] Date of Birth/Sex: Treating RN: 22-May-1951 (69 y.o. [name] Primary Care Provider: PA TIENT, NO Other Clinician: Referring Provider: Treating Provider/Extender: Arta Silence in Treatment: 18 Verbal / Phone Orders: No Diagnosis Coding ICD-10 Coding Code Description E11.621 Type 2 diabetes mellitus with foot ulcer 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 of other part of right foot with fat layer exposed E11.42 Type 2 diabetes mellitus with diabetic polyneuropathy M14.671 Charcot's joint, right ankle and  foot Follow-up Appointments Return appointment in 1 month. Dressing Change Frequency Wound #3 Right,Plantar Calcaneus Change dressing every day. Wound Cleansing Wound #3 Right,Plantar Calcaneus May shower and wash wound with soap and water. - on days that dressing is changed Primary Wound Dressing Wound #3 Right,Plantar Calcaneus Calcium Alginate with Silver Secondary Dressing Wound #3 Right,Plantar Calcaneus Foam - foam donut Kerlix/Rolled Gauze - secure with tape Dry Gauze Other: - ace wrap over dressing Off-Loading Turn and reposition every 2 hours Other: - float heels off of bed/chair with pillow under calves Electronic Signature(s) Signed: 05/04/2020 5:09:59 PM By: Linton Ham MD Signed: 05/04/2020 5:26:11 PM By: Levan Hurst RN, BSN Entered By: Levan Hurst on 05/04/2020 09:27:36 -------------------------------------------------------------------------------- Problem List Details Patient Name: Date of Service: Nathan Moll, PA UL D. 05/04/2020 9:15 A M Medical Record Number: [id number] Patient Account Number: [id number] Date of Birth/Sex: Treating RN: 06/11/1951 (69 y.o. [name] Primary Care Provider: PA Haig Prophet, NO Other Clinician: Referring Provider: Treating Provider/Extender: Arta Silence in Treatment: 18 Active Problems ICD-10 Encounter Code Description Active Date MDM Diagnosis E11.621 Type 2 diabetes mellitus with foot ulcer 12/29/2019 No Yes 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 of other part of right foot with fat layer exposed 12/29/2019 No Yes E11.42 Type 2 diabetes mellitus with diabetic polyneuropathy 12/29/2019 No Yes M14.671 Charcot's joint, right ankle and foot 12/29/2019 No Yes Inactive Problems Resolved Problems Electronic Signature(s) Signed: 05/04/2020 5:09:59 PM By: Linton Ham MD Entered By: Linton Ham on 05/04/2020  09:41:17 -------------------------------------------------------------------------------- Progress Note Details Patient Name: Date of Service: Nathan Moll, PA UL D. 05/04/2020 9:15 A M Medical Record Number: [id number] Patient Account Number: [id number] Date of Birth/Sex: Treating RN: 02-Jun-1951 (69 y.o. [name] Primary Care Provider: PA Haig Prophet, NO Other Clinician: Referring Provider: Treating Provider/Extender: Arta Silence in Treatment: 18 Subjective History of Present Illness (HPI) The following HPI elements were documented for the patient's wound: Location: right foot Severity: Wag 3 Duration: #months Modifying Factors: Diabetic and charcot' foot.. Peripheral vascular disease Associated Signs and Symptoms: osteo see chief complaint. 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 This is been present for 5-6 years. He underwent a course  in itself is really quite amazing READMISSION 12/29/2019 Mr. Callanan is a now 69 year old man who is in this clinic many years ago for osteomyelitis and a wound on his right lateral foot. 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 He has a Charcot foot deformity. He had extensive orthopedic surgery on this foot as well removing under the underlying metatarsals that at the time had osteomyelitis. We followed him for a long period palliatively for a deep wound on the right lateral foot. He had refused an amputation probably 8 or 9 years ago and underwent IV antibiotics and hyperbaric oxygen. Miraculously this wound actually healed over I believe in 2019. The patient has a wound on the right lateral plantar heel which is not in the same spot as the previous wound. This is a weightbearing surface. The wound looks fairly benign but there is significant undermining. No erythema. They state that is started as a blister they used leftover silver alginate, Neosporin and trying to offload this as best he can. He has custom-made shoes with an AFO brace on the right. Past medical history is reviewed predominantly coronary artery disease, pacemaker, stent, type 2 diabetes with a recent hemoglobin A1c of 6.1. ABI in our clinic was 1.0 on the right 3/22;  areas on the right lateral heel not any better than last time. He has some undermining medially. This is undoubtedly a weightbearing surface here. This will not be easy to heal. 3/29; this is a patient with type 2 diabetes and her Charcot foot. He has an area on the plantar heel on the right. We have been using silver alginate. He is his own special boot with a brace. 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 Areas on the plantar heel on the right. Improvement in depth. We have been using silver alginate 4/26 Charcot foot. Right plantar heel lateral aspect. No real improvement today. We have been using silver alginate because of drainage. This is the reason he does not want a total contact cast he says the dressing simply needs to be changed daily. He has a new custom shoe with an AFO brace on the right. He states the wound started in his old shoes 5/18; Charcot foot previous foot surgery and probably some degree of localized lymphedema in the right lateral foot. He has specialized shoes with an AFO on this side. There is not an option to offload this wound which is on the right plantar heel lateral aspect. We have been using silver alginate. The wound is somewhat smaller. He does not have an arterial issue 6/8; Charcot foot previous surgery probably some degree of localized lymphedema in the right lateral foot. He has specialized shoes with an AFO brace. Wound is on the right lateral calcaneus. We have been using silver alginate. Dimensions are smaller. 7/6; monthly follow-up. Charcot foot deformity with localized lymphedema in the right lateral foot from previous surgery. He wears specially shoes with an AFO. Wound is on the right lateral calcaneus. Some improvement in the dimensions Electronic Signature(s) Signed: 05/04/2020 5:09:59 PM By: Linton Ham MD Entered By: Linton Ham on 05/04/2020  09:42:11 -------------------------------------------------------------------------------- Physical Exam Details Patient Name: Date of Service: Nathan Moll, PA UL D. 05/04/2020 9:15 A M Medical Record Number: [id number] Patient Account Number: [id number] Date of Birth/Sex: Treating RN: 12-Nov-1950 (69 y.o. [name] Primary Care Provider: PA Darnelle Spangle Other Clinician: Referring Provider:

## 2020-06-04 ENCOUNTER — Encounter: Payer: Self-pay | Admitting: Internal Medicine

## 2020-06-04 ENCOUNTER — Ambulatory Visit: Payer: PPO | Admitting: Internal Medicine

## 2020-06-04 VITALS — BP 156/80 | HR 55 | Ht 68.0 in | Wt 248.0 lb

## 2020-06-04 DIAGNOSIS — I442 Atrioventricular block, complete: Secondary | ICD-10-CM | POA: Diagnosis not present

## 2020-06-04 DIAGNOSIS — Z950 Presence of cardiac pacemaker: Secondary | ICD-10-CM | POA: Diagnosis not present

## 2020-06-04 LAB — CUP PACEART INCLINIC DEVICE CHECK
Battery Remaining Longevity: 71 mo
Battery Voltage: 3 V
Brady Statistic RV Percent Paced: 99.57 %
Date Time Interrogation Session: 20210806123659
Implantable Lead Implant Date: 20160325
Implantable Lead Location: 753860
Implantable Lead Model: 5076
Implantable Pulse Generator Implant Date: 20160325
Lead Channel Impedance Value: 475 Ohm
Lead Channel Impedance Value: 532 Ohm
Lead Channel Pacing Threshold Amplitude: 0.5 V
Lead Channel Pacing Threshold Pulse Width: 0.4 ms
Lead Channel Sensing Intrinsic Amplitude: 19.5 mV
Lead Channel Setting Pacing Amplitude: 2.5 V
Lead Channel Setting Pacing Pulse Width: 0.4 ms
Lead Channel Setting Sensing Sensitivity: 0.9 mV

## 2020-06-04 MED ORDER — LISINOPRIL 40 MG PO TABS: 40.0000 mg | ORAL_TABLET | Freq: Every day | ORAL | 11 refills | Status: DC

## 2020-06-04 NOTE — Patient Instructions (Addendum)
Medication Instructions:   Increase Lisinopril to 40mg  daily.   Continue all other medications.    Labwork: none  Testing/Procedures: none  Follow-Up: 1 year   Any Other Special Instructions Will Be Listed Below (If Applicable).  If you need a refill on your cardiac medications before your next appointment, please call your pharmacy.

## 2020-06-04 NOTE — Progress Notes (Signed)
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  Since last being seen in our clinic, the patient reports doing very well. Walks with a cane.  Follows in the wound clinic. Today, he denies symptoms of palpitations, chest pain, shortness of breath,  lower extremity edema, dizziness, presyncope, or syncope.  The patient is otherwise without complaint today.   Past Medical History:  Diagnosis Date  . CAD (coronary artery disease)    NSTEMI in 2001. Cardiac cath showed: LAD: 40% proximal, LCX: 70% mid, RCA thrombotic occlusion in mid segment. s/p PCI and 2 overlapped BMSs to RCA.   [name] CHF (congestive heart failure) ([place])   . Diabetes mellitus   . Hypertension   . Osteomyelitis [facility])    Past Surgical History:  Procedure Laterality Date  . CARDIAC CATHETERIZATION    . CORONARY ANGIOPLASTY    . PERMANENT PACEMAKER INSERTION N/A 01/22/2015   MDT Advisa pacemaker implanted for complete heart block by Dr Rayann Heman  . TEMPORARY PACEMAKER INSERTION N/A 01/21/2015   Procedure: TEMPORARY PACEMAKER INSERTION;  Surgeon: Leonie Man, MD;  Location: [facility] CATH LAB;  Service: Cardiovascular;  Laterality: N/A;    ROS- all systems are reviewed and negative except as per HPI above  Current Outpatient Medications  Medication Sig Dispense Refill  . aspirin EC 81 MG tablet Take 1 tablet (81 mg total) by mouth daily. 30 tablet 0  . atorvastatin (LIPITOR) 80 MG tablet Take 1 tablet (80 mg total) by mouth daily. 90 tablet 3  . carvedilol (COREG) 25 MG tablet Take 1 tablet (25 mg total) by mouth 2 (two) times daily. 180 tablet 3  . lisinopril (PRINIVIL,ZESTRIL) 20 MG tablet Take 20 mg by mouth daily.    . metFORMIN (GLUCOPHAGE) 500 MG tablet Take 1 tablet (500 mg total) by mouth 2 (two) times daily with a meal. 60 tablet 0  . Multiple Vitamins-Minerals (CENTRUM ADULTS) TABS Take 1 tablet by  mouth daily.    . nitroGLYCERIN (NITROSTAT) 0.4 MG SL tablet Place 1 tablet (0.4 mg total) under the tongue every 5 (five) minutes as needed for chest pain. 20 tablet 0   No current facility-administered medications for this visit.    Physical Exam: Vitals:   06/04/20 1022  BP: (!) 156/80  Pulse: (!) 55  Weight: 248 lb (112.5 kg)  Height: 5\' 8"  (1.727 m)    GEN- The patient is well appearing, alert and oriented x 3 today.   Head- normocephalic, atraumatic Eyes-  Sclera clear, conjunctiva pink Ears- hearing intact Oropharynx- clear Lungs-   normal work of breathing Chest- pacemaker pocket is well healed Heart- Regular rate and rhythm  GI- soft  Extremities- no clubbing, cyanosis, or edema  Pacemaker interrogation- reviewed in detail today,  See PACEART report  ekg tracing ordered today is personally reviewed and shows sinus with AV dissociation, VVI paced  Assessment and Plan:  1. Symptomatic complete heart block Normal pacemaker function See Pace Art report No changes today he is device dependant today At time of implant, only a ventricular lead could be placed due to venous anatomy issues. He has done great with VVI pacing and currently we do not plan to upgrade his device.  2. HTN Elevated Increase lisinopril to 40mg  daily today Consider adding low dose diuretic if BP remains elevated He is instructed to follow-up with PCP in November for  bmet and BP follow-up  3. CAD No ischemic symptoms  4. obesity Body mass index is 37.71 kg/m. Lifestyle modification is advised  Risks, benefits and potential toxicities for medications prescribed and/or refilled reviewed with patient today  Return to see me in a year.   Thompson Grayer MD, [facility] 06/04/2020 10:41 AM

## 2020-06-08 ENCOUNTER — Other Ambulatory Visit (HOSPITAL_COMMUNITY)
Admission: RE | Admit: 2020-06-08 | Discharge: 2020-06-08 | Disposition: A | Discharge status: Home / Self Care | Payer: PPO | Source: Other Acute Inpatient Hospital | Attending: Internal Medicine | Admitting: Internal Medicine

## 2020-06-08 ENCOUNTER — Encounter (HOSPITAL_BASED_OUTPATIENT_CLINIC_OR_DEPARTMENT_OTHER): Payer: PPO | Attending: Internal Medicine | Admitting: Internal Medicine

## 2020-06-08 DIAGNOSIS — E1151 Type 2 diabetes mellitus with diabetic peripheral angiopathy without gangrene: Secondary | ICD-10-CM | POA: Insufficient documentation

## 2020-06-08 DIAGNOSIS — E1142 Type 2 diabetes mellitus with diabetic polyneuropathy: Secondary | ICD-10-CM | POA: Diagnosis not present

## 2020-06-08 DIAGNOSIS — M1990 Unspecified osteoarthritis, unspecified site: Secondary | ICD-10-CM | POA: Diagnosis not present

## 2020-06-08 DIAGNOSIS — Z950 Presence of cardiac pacemaker: Secondary | ICD-10-CM | POA: Diagnosis not present

## 2020-06-08 DIAGNOSIS — E11621 Type 2 diabetes mellitus with foot ulcer: Secondary | ICD-10-CM | POA: Diagnosis not present

## 2020-06-08 DIAGNOSIS — I252 Old myocardial infarction: Secondary | ICD-10-CM | POA: Insufficient documentation

## 2020-06-08 DIAGNOSIS — Z87891 Personal history of nicotine dependence: Secondary | ICD-10-CM | POA: Insufficient documentation

## 2020-06-08 DIAGNOSIS — E11610 Type 2 diabetes mellitus with diabetic neuropathic arthropathy: Secondary | ICD-10-CM | POA: Insufficient documentation

## 2020-06-08 DIAGNOSIS — L97419 Non-pressure chronic ulcer of right heel and midfoot with unspecified severity: Secondary | ICD-10-CM | POA: Diagnosis not present

## 2020-06-08 DIAGNOSIS — L97512 Non-pressure chronic ulcer of other part of right foot with fat layer exposed: Secondary | ICD-10-CM | POA: Insufficient documentation

## 2020-06-08 DIAGNOSIS — I2510 Atherosclerotic heart disease of native coronary artery without angina pectoris: Secondary | ICD-10-CM | POA: Insufficient documentation

## 2020-06-08 DIAGNOSIS — I890 Lymphedema, not elsewhere classified: Secondary | ICD-10-CM | POA: Diagnosis not present

## 2020-06-08 DIAGNOSIS — L97412 Non-pressure chronic ulcer of right heel and midfoot with fat layer exposed: Secondary | ICD-10-CM | POA: Diagnosis not present

## 2020-06-08 NOTE — Progress Notes (Signed)
Details Patient Name: Date of Service: Nathan Miller, [place] D. 06/08/2020 9:15 A M Medical Record Number: [id number] Patient Account Number: [id number] Date of Birth/Sex: Treating RN: [birth year] (68 y.o. [name]) Carlene Coria Primary Care Provider: York Pellant [place] Other Clinician: Referring Provider: Treating Provider/Extender: Suann Larry in Treatment: 23 Constitutional alert and oriented x 3. sitting or standing blood pressure is within target range for patient.. supine blood pressure is within target range for patient.. pulse regular and within target range for patient.[name] respirations regular, non-labored and within target range for patient.[name] temperature within target range for patient.. . . Well- nourished and well-hydrated in no acute distress. Notes Used #3 curette to debride the edges of the wound with undermining is present on all sides, wound size bigger account of this, also cultured given some foul- smelling drainage both aerobic and anaerobic Electronic Signature(s) Signed: 06/08/2020 10:04:58 AM By: Tobi Bastos MD, MBA Entered By: Tobi Bastos on 06/08/2020 10:04:58 -------------------------------------------------------------------------------- Physician Orders Details Patient Name: Date of Service: Nathan Moll, PA [place] D. 06/08/2020 9:15 A M Medical Record Number: [id number] Patient Account Number: [id number] Date of Birth/Sex: Treating RN: Mar 30, 1951 (68 y.o. 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 Dressing Change Frequency Wound #3 Right,Plantar Calcaneus Change dressing every day. Wound Cleansing Wound #3 Right,Plantar Calcaneus May shower and wash wound with soap and water. - on days that dressing is changed Primary Wound Dressing Wound #3 Right,Plantar Calcaneus Calcium Alginate with Silver Secondary Dressing Wound #3 Right,Plantar Calcaneus Foam - foam donut Kerlix/Rolled Gauze - secure with tape Dry Gauze Other: - ace wrap over dressing Off-Loading Turn and reposition every 2 hours Other: - float heels off of bed/chair with pillow under calves Laboratory naerobe culture (MICRO) - RIGHT HEEL WOUND NONHEALING WOUND - (ICD10 E11.621 - Bacteria identified in Unspecified specimen by A Type 2 diabetes mellitus with foot ulcer) LOINC Code: 008-6 Convenience Name: Anerobic culture Electronic Signature(s) Signed: 06/08/2020 4:52:06 PM By: Tobi Bastos MD, MBA Signed: 06/08/2020 5:05:11 PM By: Carlene Coria RN Entered By: Carlene Coria on 06/08/2020 09:58:31 -------------------------------------------------------------------------------- Problem List Details Patient Name: Date of Service: Nathan Moll, PA UL D. 06/08/2020 9:15 A M Medical Record Number: [id number] Patient Account Number: [id number] Date of Birth/Sex: Treating RN: 09/29/1951 (68 y.o. 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 06/08/2020 9:15 A M Medical Record Number: [id number] Patient Account  Number: [id number] Date of Birth/Sex: Treating RN: January 08, 1951 (68 y.o. [name]) Carlene Coria Primary Care Provider: Sharmaine Base, [place] Other Clinician: Referring Provider: Treating Provider/Extender: Suann Larry in Treatment: 23 Subjective History of Present Illness (HPI) The following HPI elements were documented for the patient's wound: Location: right foot Severity: Wag 3 Duration: #months Modifying Factors: Diabetic and charcot' foot.. Peripheral vascular disease Associated Signs and Symptoms: osteo see chief complaint. 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 This is been present for 5-6 years. He underwent a course of IV any biotics and hyperbaric oxygen roughly 5 years ago. He was offered an amputation by orthopedics and I believe at the time I concurred with this however the patient wishes to up go via wait-and-see approach. He is actually done fairly well over the 5 years is still ambulatory. He uses a silver alginate-based dressing on the foot which he changes himself up. 05/27/15; patient returns for  his monthly visit the. The status of his foot and his wound are essentially unchanged this patient has a chronic idiopathic neuropathy. He has underlying osteomyelitis. He had extensive orthopedic surgery roughly 3 or 4 years ago had. He has underlying chronic osteomyelitis.[name] He dresses this with silver alginate. We order the supplies. I continue to see him on a monthly basis for wound debridement. 06/24/15 the patient arrives here today for his monthly wound care check. He has underlying chronic osteomyelitis. The wound was surgically debridement of nonviable circumferential tissue. He is continued with silver alginate based stressing severe this area changed daily by his wife. Post debridement this actually looks as good as I've seen this since quite some time 07/29/15. The patient has been coming here monthly for many years. He has a chronic wound on his right lateral foot at roughly the mid shaft of his fifth metatarsal. Initially he underwent a code course of IV antibiotics hyperbaric oxygen, resection I believe of his fifth and fourth metatarsal for chronic osteomyelitis. 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 He was offered an amputation at some point but refused. He comes here for "palliative" wound care which requires extensive debridement of necrotic subcutaneous tissue to clean up the wound bed. He has been using silver alginate based dressings at home for many years. Although he has extensive edema of his foot, the situation has overall remained stable perhaps surprisingly so. 09/16/15; the patient has been coming here for many years. He is a chronic wound in his right lateral foot roughly the mid shaft of the fifth metatarsal. He is had previous surgery on this with resection. He clearly has underlying chronic osteomyelitis. An amputation was recommended years ago which she refused MRI to list the he has done exceptionally well with "palliative" wound dressings. He comes in to  see me about every 4-6 weeks for debridement of nonviable tissue surface eschar and he has been using silver alginate based dressings. 10/14/15; the patient is here for his maintenance surgical debridement of the non-healing wound on the lateral aspect of his right foot. He has underlying osteomyelitis but is miraculously managed to hold on to this foot in spite of all odds. He completed aggressive management including surgery, hyperbarics perhaps 4 years ago. Unfortunately this never closed. There continues to be swelling of the foot however this has been stable for a long time. 11/25/15; the patient is here for maintenance surgical debridement of a wound on  of Tissue Pre Debridement is: Fat layer exposed. There was a Excisional Skin/Subcutaneous Tissue Debridement with a total area of 0.16 sq cm performed by Tobi Bastos, MD. With the following instrument(s): Curette to remove Viable and Non-Viable tissue/material. Material removed includes Callus, Subcutaneous Tissue, Slough, Skin: Dermis, and Skin: Epidermis after achieving pain control using Lidocaine 5% topical ointment. No specimens were taken. A time out was conducted at 09:52, prior to the start of the procedure. A Minimum amount of bleeding was controlled with Pressure. The procedure was tolerated well with a pain level of 0 throughout and a pain level of 0 following the procedure. Post Debridement Measurements: 0.8cm length x 0.2cm width x 0.6cm depth; 0.075cm^3 volume. Character of Wound/Ulcer Post Debridement is improved. Severity of Tissue Post Debridement is: Fat layer exposed. Post procedure Diagnosis Wound #3: Same as Pre-Procedure Plan Follow-up Appointments: Return appointment in 1 month. Dressing Change Frequency: Wound #3 Right,Plantar Calcaneus: Change dressing every day. Wound Cleansing: Wound #3 Right,Plantar Calcaneus: May shower and wash wound with soap and water.  - on days that dressing is changed Primary Wound Dressing: Wound #3 Right,Plantar Calcaneus: Calcium Alginate with Silver Secondary Dressing: Wound #3 Right,Plantar Calcaneus: Foam - foam donut Kerlix/Rolled Gauze - secure with tape Dry Gauze Other: - ace wrap over dressing Off-Loading: Turn and reposition every 2 hours Other: - float heels off of bed/chair with pillow under calves Laboratory ordered were: Anerobic culture, RIGHT HEEL - RIGHT HEEL WOUND NONHEALING WOUND -Monitor culture results for possible antibiotic treatment -Emphasized continued offloading attempts considering the location of this wound and the challenges with TCC that cannot be employed as a strategy to offload -Continue silver alginate to the wound -Return at 1 month sooner if needed, follow-up on cultures Electronic Signature(s) Signed: 06/08/2020 10:06:10 AM By: Tobi Bastos MD, MBA Entered By: Tobi Bastos on 06/08/2020 10:06:09 -------------------------------------------------------------------------------- SuperBill Details Patient Name: Date of Service: Nathan Moll, PA [place] D. 06/08/2020 Medical Record Number: [id number] Patient Account Number: [id number] Date of Birth/Sex: Treating RN: Feb 01, 1951 (68 y.o. 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 There was a Excisional Skin/Subcutaneous Tissue Debridement with a total area of 0.16 sq cm performed by Tobi Bastos, MD. With the following instrument(s): Curette to remove Viable and Non-Viable tissue/material. Material removed includes Callus, Subcutaneous Tissue, Slough, Skin: Dermis, and Skin: Epidermis after achieving pain control using Lidocaine 5% topical ointment. No specimens were taken. A time out was conducted at 09:52, prior to the start of the procedure. A Minimum amount of bleeding was controlled with Pressure. The procedure was tolerated well with a pain level of 0 throughout and a pain level of 0 following the procedure. Post Debridement Measurements: 0.8cm length x 0.2cm width x 0.6cm depth; 0.075cm^3 volume. Character of Wound/Ulcer Post Debridement is improved. Severity of Tissue Post Debridement is: Fat layer exposed. Post procedure Diagnosis Wound #3: Same as Pre-Procedure Plan Follow-up Appointments: Return appointment in 1 month. Dressing Change Frequency: Wound #3 Right,Plantar Calcaneus: Change dressing every day. Wound Cleansing: Wound #3 Right,Plantar Calcaneus: May shower and wash wound with soap and water.  - on days that dressing is changed Primary Wound Dressing: Wound #3 Right,Plantar Calcaneus: Calcium Alginate with Silver Secondary Dressing: Wound #3 Right,Plantar Calcaneus: Foam - foam donut Kerlix/Rolled Gauze - secure with tape Dry Gauze Other: - ace wrap over dressing Off-Loading: Turn and reposition every 2 hours Other: - float heels off of bed/chair with pillow under calves Laboratory ordered were: Anerobic culture, RIGHT HEEL - RIGHT HEEL WOUND NONHEALING WOUND -Monitor culture results for possible antibiotic treatment -Emphasized continued offloading attempts considering the location of this wound and the challenges with TCC that cannot be employed as a strategy to offload -Continue silver alginate to the wound -Return at 1 month sooner if needed, follow-up on cultures Electronic Signature(s) Signed: 06/08/2020 10:06:10 AM By: Tobi Bastos MD, MBA Entered By: Tobi Bastos on 06/08/2020 10:06:09 -------------------------------------------------------------------------------- SuperBill Details Patient Name: Date of Service: Nathan Moll, PA UL D. 06/08/2020 Medical Record Number: [id number] Patient Account Number: [id number] Date of Birth/Sex: Treating RN: Feb 01, 1951 (68 y.o. 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, right ankle and foot Facility Procedures CPT4 Code: 09983382 Description: 50539 - DEB SUBQ TISSUE 20 SQ CM/< ICD-10 Diagnosis Description E11.621 Type 2 diabetes mellitus with foot ulcer Modifier: Quantity: 1 Physician Procedures : CPT4 Code  Description Modifier [id number] - WC PHYS SUBQ TISS 20 SQ CM ICD-10 Diagnosis Description E11.621 Type 2 diabetes mellitus with foot ulcer Quantity: 1 Electronic Signature(s) Signed: 06/08/2020 10:06:24 AM By: Tobi Bastos MD, MBA Entered By: Tobi Bastos on 06/08/2020 10:06:23  Nathan Miller, Nathan Miller ([id number]) Visit Report for 06/08/2020 Debridement Details Patient Name: Date of Service: Nathan Miller, [place] D. 06/08/2020 9:15 A M Medical Record Number: [id number] Patient Account Number: [id number] Date of Birth/Sex: Treating RN: [birth year] (68 y.o. [name]) Carlene Coria Primary Care Provider: Sharmaine Base, [place] Other Clinician: Referring Provider: Treating Provider/Extender: Suann Larry in Treatment: 23 Debridement Performed for Assessment: Wound #3 Right,Plantar Calcaneus Performed By: Physician Tobi Bastos, MD Debridement Type: Debridement Severity of Tissue Pre Debridement: Fat layer exposed Level of Consciousness (Pre-procedure): Awake and Alert Pre-procedure Verification/Time Out Yes - 09:52 Taken: Start Time: 09:52 Pain Control: Lidocaine 5% topical ointment T Area Debrided (L x W): otal 0.8 (cm) x 0.2 (cm) = 0.16 (cm) Tissue and other material debrided: Viable, Non-Viable, Callus, Slough, Subcutaneous, Skin: Dermis , Skin: Epidermis, Slough Level: Skin/Subcutaneous Tissue Debridement Description: Excisional Instrument: Curette Bleeding: Minimum Hemostasis Achieved: Pressure End Time: 09:55 Procedural Pain: 0 Post Procedural Pain: 0 Response to Treatment: Procedure was tolerated well Level of Consciousness (Post- Awake and Alert procedure): Post Debridement Measurements of Total Wound Length: (cm) 0.8 Width: (cm) 0.2 Depth: (cm) 0.6 Volume: (cm) 0.075 Character of Wound/Ulcer Post Debridement: Improved Severity of Tissue Post Debridement: Fat layer exposed Post Procedure Diagnosis Same as Pre-procedure Electronic Signature(s) Signed: 06/08/2020 4:52:06 PM By: Tobi Bastos MD, MBA Signed: 06/08/2020 5:05:11 PM By: Carlene Coria RN Entered By: Carlene Coria on 06/08/2020 09:54:52 -------------------------------------------------------------------------------- HPI Details Patient Name: Date of Service: Nathan Moll, PA UL  D. 06/08/2020 9:15 A M Medical Record Number: [id number] Patient Account Number: [id number] Date of Birth/Sex: Treating RN: [birth year] (68 y.o. [place] Primary Care Provider: York Pellant [place] Other Clinician: Referring Provider: Treating Provider/Extender: Suann Larry in Treatment: 23 History of Present Illness Location: right foot Severity: Wag 3 Duration: #months Modifying Factors: Diabetic and charcot' foot.. Peripheral vascular disease ssociated Signs and Symptoms: osteo A HPI Description: see chief complaint. 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 This is been present for 5-6 years. He underwent a course of IV any biotics and hyperbaric oxygen roughly 5 years ago. He was offered an amputation by orthopedics and I believe at the time I concurred with this however the patient wishes to up go via wait-and-see approach. He is actually done fairly well over the 5 years is still ambulatory. He uses a silver alginate-based dressing on the foot which he changes himself up. 05/27/15; patient returns for his monthly visit the. The status of his foot and his wound are essentially unchanged this patient has a chronic idiopathic neuropathy. He has underlying osteomyelitis. He had extensive orthopedic surgery roughly 3 or 4 years ago had. He has underlying chronic osteomyelitis.[name] He dresses this with silver alginate. We order the supplies. I continue to see him on a monthly basis for wound debridement. 06/24/15 the patient arrives here today for his monthly wound care check. He has underlying chronic osteomyelitis. The wound was surgically debridement of nonviable circumferential tissue. He is continued with silver alginate based stressing severe this area changed daily by his wife. Post debridement this actually looks as good as I've seen this since quite  some time 07/29/15. The patient has been coming here monthly for many years. He has a chronic wound on his right lateral foot at roughly the mid shaft of his fifth metatarsal. Initially he underwent a code course of IV  Number: [id number] Date of Birth/Sex: Treating RN: January 08, 1951 (68 y.o. [name]) Carlene Coria Primary Care Provider: Sharmaine Base, [place] Other Clinician: Referring Provider: Treating Provider/Extender: Suann Larry in Treatment: 23 Subjective History of Present Illness (HPI) The following HPI elements were documented for the patient's wound: Location: right foot Severity: Wag 3 Duration: #months Modifying Factors: Diabetic and charcot' foot.. Peripheral vascular disease Associated Signs and Symptoms: osteo see chief complaint. 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 This is been present for 5-6 years. He underwent a course of IV any biotics and hyperbaric oxygen roughly 5 years ago. He was offered an amputation by orthopedics and I believe at the time I concurred with this however the patient wishes to up go via wait-and-see approach. He is actually done fairly well over the 5 years is still ambulatory. He uses a silver alginate-based dressing on the foot which he changes himself up. 05/27/15; patient returns for  his monthly visit the. The status of his foot and his wound are essentially unchanged this patient has a chronic idiopathic neuropathy. He has underlying osteomyelitis. He had extensive orthopedic surgery roughly 3 or 4 years ago had. He has underlying chronic osteomyelitis.[name] He dresses this with silver alginate. We order the supplies. I continue to see him on a monthly basis for wound debridement. 06/24/15 the patient arrives here today for his monthly wound care check. He has underlying chronic osteomyelitis. The wound was surgically debridement of nonviable circumferential tissue. He is continued with silver alginate based stressing severe this area changed daily by his wife. Post debridement this actually looks as good as I've seen this since quite some time 07/29/15. The patient has been coming here monthly for many years. He has a chronic wound on his right lateral foot at roughly the mid shaft of his fifth metatarsal. Initially he underwent a code course of IV antibiotics hyperbaric oxygen, resection I believe of his fifth and fourth metatarsal for chronic osteomyelitis. 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 He was offered an amputation at some point but refused. He comes here for "palliative" wound care which requires extensive debridement of necrotic subcutaneous tissue to clean up the wound bed. He has been using silver alginate based dressings at home for many years. Although he has extensive edema of his foot, the situation has overall remained stable perhaps surprisingly so. 09/16/15; the patient has been coming here for many years. He is a chronic wound in his right lateral foot roughly the mid shaft of the fifth metatarsal. He is had previous surgery on this with resection. He clearly has underlying chronic osteomyelitis. An amputation was recommended years ago which she refused MRI to list the he has done exceptionally well with "palliative" wound dressings. He comes in to  see me about every 4-6 weeks for debridement of nonviable tissue surface eschar and he has been using silver alginate based dressings. 10/14/15; the patient is here for his maintenance surgical debridement of the non-healing wound on the lateral aspect of his right foot. He has underlying osteomyelitis but is miraculously managed to hold on to this foot in spite of all odds. He completed aggressive management including surgery, hyperbarics perhaps 4 years ago. Unfortunately this never closed. There continues to be swelling of the foot however this has been stable for a long time. 11/25/15; the patient is here for maintenance surgical debridement of a wound on  Number: [id number] Date of Birth/Sex: Treating RN: January 08, 1951 (68 y.o. [name]) Carlene Coria Primary Care Provider: Sharmaine Base, [place] Other Clinician: Referring Provider: Treating Provider/Extender: Suann Larry in Treatment: 23 Subjective History of Present Illness (HPI) The following HPI elements were documented for the patient's wound: Location: right foot Severity: Wag 3 Duration: #months Modifying Factors: Diabetic and charcot' foot.. Peripheral vascular disease Associated Signs and Symptoms: osteo see chief complaint. 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 This is been present for 5-6 years. He underwent a course of IV any biotics and hyperbaric oxygen roughly 5 years ago. He was offered an amputation by orthopedics and I believe at the time I concurred with this however the patient wishes to up go via wait-and-see approach. He is actually done fairly well over the 5 years is still ambulatory. He uses a silver alginate-based dressing on the foot which he changes himself up. 05/27/15; patient returns for  his monthly visit the. The status of his foot and his wound are essentially unchanged this patient has a chronic idiopathic neuropathy. He has underlying osteomyelitis. He had extensive orthopedic surgery roughly 3 or 4 years ago had. He has underlying chronic osteomyelitis.[name] He dresses this with silver alginate. We order the supplies. I continue to see him on a monthly basis for wound debridement. 06/24/15 the patient arrives here today for his monthly wound care check. He has underlying chronic osteomyelitis. The wound was surgically debridement of nonviable circumferential tissue. He is continued with silver alginate based stressing severe this area changed daily by his wife. Post debridement this actually looks as good as I've seen this since quite some time 07/29/15. The patient has been coming here monthly for many years. He has a chronic wound on his right lateral foot at roughly the mid shaft of his fifth metatarsal. Initially he underwent a code course of IV antibiotics hyperbaric oxygen, resection I believe of his fifth and fourth metatarsal for chronic osteomyelitis. 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 He was offered an amputation at some point but refused. He comes here for "palliative" wound care which requires extensive debridement of necrotic subcutaneous tissue to clean up the wound bed. He has been using silver alginate based dressings at home for many years. Although he has extensive edema of his foot, the situation has overall remained stable perhaps surprisingly so. 09/16/15; the patient has been coming here for many years. He is a chronic wound in his right lateral foot roughly the mid shaft of the fifth metatarsal. He is had previous surgery on this with resection. He clearly has underlying chronic osteomyelitis. An amputation was recommended years ago which she refused MRI to list the he has done exceptionally well with "palliative" wound dressings. He comes in to  see me about every 4-6 weeks for debridement of nonviable tissue surface eschar and he has been using silver alginate based dressings. 10/14/15; the patient is here for his maintenance surgical debridement of the non-healing wound on the lateral aspect of his right foot. He has underlying osteomyelitis but is miraculously managed to hold on to this foot in spite of all odds. He completed aggressive management including surgery, hyperbarics perhaps 4 years ago. Unfortunately this never closed. There continues to be swelling of the foot however this has been stable for a long time. 11/25/15; the patient is here for maintenance surgical debridement of a wound on  of Tissue Pre Debridement is: Fat layer exposed. There was a Excisional Skin/Subcutaneous Tissue Debridement with a total area of 0.16 sq cm performed by Tobi Bastos, MD. With the following instrument(s): Curette to remove Viable and Non-Viable tissue/material. Material removed includes Callus, Subcutaneous Tissue, Slough, Skin: Dermis, and Skin: Epidermis after achieving pain control using Lidocaine 5% topical ointment. No specimens were taken. A time out was conducted at 09:52, prior to the start of the procedure. A Minimum amount of bleeding was controlled with Pressure. The procedure was tolerated well with a pain level of 0 throughout and a pain level of 0 following the procedure. Post Debridement Measurements: 0.8cm length x 0.2cm width x 0.6cm depth; 0.075cm^3 volume. Character of Wound/Ulcer Post Debridement is improved. Severity of Tissue Post Debridement is: Fat layer exposed. Post procedure Diagnosis Wound #3: Same as Pre-Procedure Plan Follow-up Appointments: Return appointment in 1 month. Dressing Change Frequency: Wound #3 Right,Plantar Calcaneus: Change dressing every day. Wound Cleansing: Wound #3 Right,Plantar Calcaneus: May shower and wash wound with soap and water.  - on days that dressing is changed Primary Wound Dressing: Wound #3 Right,Plantar Calcaneus: Calcium Alginate with Silver Secondary Dressing: Wound #3 Right,Plantar Calcaneus: Foam - foam donut Kerlix/Rolled Gauze - secure with tape Dry Gauze Other: - ace wrap over dressing Off-Loading: Turn and reposition every 2 hours Other: - float heels off of bed/chair with pillow under calves Laboratory ordered were: Anerobic culture, RIGHT HEEL - RIGHT HEEL WOUND NONHEALING WOUND -Monitor culture results for possible antibiotic treatment -Emphasized continued offloading attempts considering the location of this wound and the challenges with TCC that cannot be employed as a strategy to offload -Continue silver alginate to the wound -Return at 1 month sooner if needed, follow-up on cultures Electronic Signature(s) Signed: 06/08/2020 10:06:10 AM By: Tobi Bastos MD, MBA Entered By: Tobi Bastos on 06/08/2020 10:06:09 -------------------------------------------------------------------------------- SuperBill Details Patient Name: Date of Service: Nathan Moll, PA UL D. 06/08/2020 Medical Record Number: [id number] Patient Account Number: [id number] Date of Birth/Sex: Treating RN: Feb 01, 1951 (68 y.o. 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 There was a Excisional Skin/Subcutaneous Tissue Debridement with a total area of 0.16 sq cm performed by Tobi Bastos, MD. With the following instrument(s): Curette to remove Viable and Non-Viable tissue/material. Material removed includes Callus, Subcutaneous Tissue, Slough, Skin: Dermis, and Skin: Epidermis after achieving pain control using Lidocaine 5% topical ointment. No specimens were taken. A time out was conducted at 09:52, prior to the start of the procedure. A Minimum amount of bleeding was controlled with Pressure. The procedure was tolerated well with a pain level of 0 throughout and a pain level of 0 following the procedure. Post Debridement Measurements: 0.8cm length x 0.2cm width x 0.6cm depth; 0.075cm^3 volume. Character of Wound/Ulcer Post Debridement is improved. Severity of Tissue Post Debridement is: Fat layer exposed. Post procedure Diagnosis Wound #3: Same as Pre-Procedure Plan Follow-up Appointments: Return appointment in 1 month. Dressing Change Frequency: Wound #3 Right,Plantar Calcaneus: Change dressing every day. Wound Cleansing: Wound #3 Right,Plantar Calcaneus: May shower and wash wound with soap and water.  - on days that dressing is changed Primary Wound Dressing: Wound #3 Right,Plantar Calcaneus: Calcium Alginate with Silver Secondary Dressing: Wound #3 Right,Plantar Calcaneus: Foam - foam donut Kerlix/Rolled Gauze - secure with tape Dry Gauze Other: - ace wrap over dressing Off-Loading: Turn and reposition every 2 hours Other: - float heels off of bed/chair with pillow under calves Laboratory ordered were: Anerobic culture, RIGHT HEEL - RIGHT HEEL WOUND NONHEALING WOUND -Monitor culture results for possible antibiotic treatment -Emphasized continued offloading attempts considering the location of this wound and the challenges with TCC that cannot be employed as a strategy to offload -Continue silver alginate to the wound -Return at 1 month sooner if needed, follow-up on cultures Electronic Signature(s) Signed: 06/08/2020 10:06:10 AM By: Tobi Bastos MD, MBA Entered By: Tobi Bastos on 06/08/2020 10:06:09 -------------------------------------------------------------------------------- SuperBill Details Patient Name: Date of Service: Nathan Moll, PA UL D. 06/08/2020 Medical Record Number: [id number] Patient Account Number: [id number] Date of Birth/Sex: Treating RN: Feb 01, 1951 (68 y.o. 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, right ankle and foot Facility Procedures CPT4 Code: 09983382 Description: 50539 - DEB SUBQ TISSUE 20 SQ CM/< ICD-10 Diagnosis Description E11.621 Type 2 diabetes mellitus with foot ulcer Modifier: Quantity: 1 Physician Procedures : CPT4 Code  Description Modifier [id number] - WC PHYS SUBQ TISS 20 SQ CM ICD-10 Diagnosis Description E11.621 Type 2 diabetes mellitus with foot ulcer Quantity: 1 Electronic Signature(s) Signed: 06/08/2020 10:06:24 AM By: Tobi Bastos MD, MBA Entered By: Tobi Bastos on 06/08/2020 10:06:23  Number: [id number] Date of Birth/Sex: Treating RN: January 08, 1951 (68 y.o. [name]) Carlene Coria Primary Care Provider: Sharmaine Base, [place] Other Clinician: Referring Provider: Treating Provider/Extender: Suann Larry in Treatment: 23 Subjective History of Present Illness (HPI) The following HPI elements were documented for the patient's wound: Location: right foot Severity: Wag 3 Duration: #months Modifying Factors: Diabetic and charcot' foot.. Peripheral vascular disease Associated Signs and Symptoms: osteo see chief complaint. 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 This is been present for 5-6 years. He underwent a course of IV any biotics and hyperbaric oxygen roughly 5 years ago. He was offered an amputation by orthopedics and I believe at the time I concurred with this however the patient wishes to up go via wait-and-see approach. He is actually done fairly well over the 5 years is still ambulatory. He uses a silver alginate-based dressing on the foot which he changes himself up. 05/27/15; patient returns for  his monthly visit the. The status of his foot and his wound are essentially unchanged this patient has a chronic idiopathic neuropathy. He has underlying osteomyelitis. He had extensive orthopedic surgery roughly 3 or 4 years ago had. He has underlying chronic osteomyelitis.[name] He dresses this with silver alginate. We order the supplies. I continue to see him on a monthly basis for wound debridement. 06/24/15 the patient arrives here today for his monthly wound care check. He has underlying chronic osteomyelitis. The wound was surgically debridement of nonviable circumferential tissue. He is continued with silver alginate based stressing severe this area changed daily by his wife. Post debridement this actually looks as good as I've seen this since quite some time 07/29/15. The patient has been coming here monthly for many years. He has a chronic wound on his right lateral foot at roughly the mid shaft of his fifth metatarsal. Initially he underwent a code course of IV antibiotics hyperbaric oxygen, resection I believe of his fifth and fourth metatarsal for chronic osteomyelitis. 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 He was offered an amputation at some point but refused. He comes here for "palliative" wound care which requires extensive debridement of necrotic subcutaneous tissue to clean up the wound bed. He has been using silver alginate based dressings at home for many years. Although he has extensive edema of his foot, the situation has overall remained stable perhaps surprisingly so. 09/16/15; the patient has been coming here for many years. He is a chronic wound in his right lateral foot roughly the mid shaft of the fifth metatarsal. He is had previous surgery on this with resection. He clearly has underlying chronic osteomyelitis. An amputation was recommended years ago which she refused MRI to list the he has done exceptionally well with "palliative" wound dressings. He comes in to  see me about every 4-6 weeks for debridement of nonviable tissue surface eschar and he has been using silver alginate based dressings. 10/14/15; the patient is here for his maintenance surgical debridement of the non-healing wound on the lateral aspect of his right foot. He has underlying osteomyelitis but is miraculously managed to hold on to this foot in spite of all odds. He completed aggressive management including surgery, hyperbarics perhaps 4 years ago. Unfortunately this never closed. There continues to be swelling of the foot however this has been stable for a long time. 11/25/15; the patient is here for maintenance surgical debridement of a wound on  Details Patient Name: Date of Service: Nathan Miller, [place] D. 06/08/2020 9:15 A M Medical Record Number: [id number] Patient Account Number: [id number] Date of Birth/Sex: Treating RN: [birth year] (68 y.o. [name]) Carlene Coria Primary Care Provider: York Pellant [place] Other Clinician: Referring Provider: Treating Provider/Extender: Suann Larry in Treatment: 23 Constitutional alert and oriented x 3. sitting or standing blood pressure is within target range for patient.. supine blood pressure is within target range for patient.. pulse regular and within target range for patient.[name] respirations regular, non-labored and within target range for patient.[name] temperature within target range for patient.. . . Well- nourished and well-hydrated in no acute distress. Notes Used #3 curette to debride the edges of the wound with undermining is present on all sides, wound size bigger account of this, also cultured given some foul- smelling drainage both aerobic and anaerobic Electronic Signature(s) Signed: 06/08/2020 10:04:58 AM By: Tobi Bastos MD, MBA Entered By: Tobi Bastos on 06/08/2020 10:04:58 -------------------------------------------------------------------------------- Physician Orders Details Patient Name: Date of Service: Nathan Moll, PA UL D. 06/08/2020 9:15 A M Medical Record Number: [id number] Patient Account Number: [id number] Date of Birth/Sex: Treating RN: Mar 30, 1951 (68 y.o. 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 Dressing Change Frequency Wound #3 Right,Plantar Calcaneus Change dressing every day. Wound Cleansing Wound #3 Right,Plantar Calcaneus May shower and wash wound with soap and water. - on days that dressing is changed Primary Wound Dressing Wound #3 Right,Plantar Calcaneus Calcium Alginate with Silver Secondary Dressing Wound #3 Right,Plantar Calcaneus Foam - foam donut Kerlix/Rolled Gauze - secure with tape Dry Gauze Other: - ace wrap over dressing Off-Loading Turn and reposition every 2 hours Other: - float heels off of bed/chair with pillow under calves Laboratory naerobe culture (MICRO) - RIGHT HEEL WOUND NONHEALING WOUND - (ICD10 E11.621 - Bacteria identified in Unspecified specimen by A Type 2 diabetes mellitus with foot ulcer) LOINC Code: 008-6 Convenience Name: Anerobic culture Electronic Signature(s) Signed: 06/08/2020 4:52:06 PM By: Tobi Bastos MD, MBA Signed: 06/08/2020 5:05:11 PM By: Carlene Coria RN Entered By: Carlene Coria on 06/08/2020 09:58:31 -------------------------------------------------------------------------------- Problem List Details Patient Name: Date of Service: Nathan Moll, PA UL D. 06/08/2020 9:15 A M Medical Record Number: [id number] Patient Account Number: [id number] Date of Birth/Sex: Treating RN: 09/29/1951 (68 y.o. 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 06/08/2020 9:15 A M Medical Record Number: [id number] Patient Account  Details Patient Name: Date of Service: Nathan Miller, [place] D. 06/08/2020 9:15 A M Medical Record Number: [id number] Patient Account Number: [id number] Date of Birth/Sex: Treating RN: [birth year] (68 y.o. [name]) Carlene Coria Primary Care Provider: York Pellant [place] Other Clinician: Referring Provider: Treating Provider/Extender: Suann Larry in Treatment: 23 Constitutional alert and oriented x 3. sitting or standing blood pressure is within target range for patient.. supine blood pressure is within target range for patient.. pulse regular and within target range for patient.[name] respirations regular, non-labored and within target range for patient.[name] temperature within target range for patient.. . . Well- nourished and well-hydrated in no acute distress. Notes Used #3 curette to debride the edges of the wound with undermining is present on all sides, wound size bigger account of this, also cultured given some foul- smelling drainage both aerobic and anaerobic Electronic Signature(s) Signed: 06/08/2020 10:04:58 AM By: Tobi Bastos MD, MBA Entered By: Tobi Bastos on 06/08/2020 10:04:58 -------------------------------------------------------------------------------- Physician Orders Details Patient Name: Date of Service: Nathan Moll, PA UL D. 06/08/2020 9:15 A M Medical Record Number: [id number] Patient Account Number: [id number] Date of Birth/Sex: Treating RN: Mar 30, 1951 (68 y.o. 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 Dressing Change Frequency Wound #3 Right,Plantar Calcaneus Change dressing every day. Wound Cleansing Wound #3 Right,Plantar Calcaneus May shower and wash wound with soap and water. - on days that dressing is changed Primary Wound Dressing Wound #3 Right,Plantar Calcaneus Calcium Alginate with Silver Secondary Dressing Wound #3 Right,Plantar Calcaneus Foam - foam donut Kerlix/Rolled Gauze - secure with tape Dry Gauze Other: - ace wrap over dressing Off-Loading Turn and reposition every 2 hours Other: - float heels off of bed/chair with pillow under calves Laboratory naerobe culture (MICRO) - RIGHT HEEL WOUND NONHEALING WOUND - (ICD10 E11.621 - Bacteria identified in Unspecified specimen by A Type 2 diabetes mellitus with foot ulcer) LOINC Code: 008-6 Convenience Name: Anerobic culture Electronic Signature(s) Signed: 06/08/2020 4:52:06 PM By: Tobi Bastos MD, MBA Signed: 06/08/2020 5:05:11 PM By: Carlene Coria RN Entered By: Carlene Coria on 06/08/2020 09:58:31 -------------------------------------------------------------------------------- Problem List Details Patient Name: Date of Service: Nathan Moll, PA UL D. 06/08/2020 9:15 A M Medical Record Number: [id number] Patient Account Number: [id number] Date of Birth/Sex: Treating RN: 09/29/1951 (68 y.o. 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 06/08/2020 9:15 A M Medical Record Number: [id number] Patient Account  Number: [id number] Date of Birth/Sex: Treating RN: January 08, 1951 (68 y.o. [name]) Carlene Coria Primary Care Provider: Sharmaine Base, [place] Other Clinician: Referring Provider: Treating Provider/Extender: Suann Larry in Treatment: 23 Subjective History of Present Illness (HPI) The following HPI elements were documented for the patient's wound: Location: right foot Severity: Wag 3 Duration: #months Modifying Factors: Diabetic and charcot' foot.. Peripheral vascular disease Associated Signs and Symptoms: osteo see chief complaint. 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 This is been present for 5-6 years. He underwent a course of IV any biotics and hyperbaric oxygen roughly 5 years ago. He was offered an amputation by orthopedics and I believe at the time I concurred with this however the patient wishes to up go via wait-and-see approach. He is actually done fairly well over the 5 years is still ambulatory. He uses a silver alginate-based dressing on the foot which he changes himself up. 05/27/15; patient returns for  his monthly visit the. The status of his foot and his wound are essentially unchanged this patient has a chronic idiopathic neuropathy. He has underlying osteomyelitis. He had extensive orthopedic surgery roughly 3 or 4 years ago had. He has underlying chronic osteomyelitis.[name] He dresses this with silver alginate. We order the supplies. I continue to see him on a monthly basis for wound debridement. 06/24/15 the patient arrives here today for his monthly wound care check. He has underlying chronic osteomyelitis. The wound was surgically debridement of nonviable circumferential tissue. He is continued with silver alginate based stressing severe this area changed daily by his wife. Post debridement this actually looks as good as I've seen this since quite some time 07/29/15. The patient has been coming here monthly for many years. He has a chronic wound on his right lateral foot at roughly the mid shaft of his fifth metatarsal. Initially he underwent a code course of IV antibiotics hyperbaric oxygen, resection I believe of his fifth and fourth metatarsal for chronic osteomyelitis. 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 He was offered an amputation at some point but refused. He comes here for "palliative" wound care which requires extensive debridement of necrotic subcutaneous tissue to clean up the wound bed. He has been using silver alginate based dressings at home for many years. Although he has extensive edema of his foot, the situation has overall remained stable perhaps surprisingly so. 09/16/15; the patient has been coming here for many years. He is a chronic wound in his right lateral foot roughly the mid shaft of the fifth metatarsal. He is had previous surgery on this with resection. He clearly has underlying chronic osteomyelitis. An amputation was recommended years ago which she refused MRI to list the he has done exceptionally well with "palliative" wound dressings. He comes in to  see me about every 4-6 weeks for debridement of nonviable tissue surface eschar and he has been using silver alginate based dressings. 10/14/15; the patient is here for his maintenance surgical debridement of the non-healing wound on the lateral aspect of his right foot. He has underlying osteomyelitis but is miraculously managed to hold on to this foot in spite of all odds. He completed aggressive management including surgery, hyperbarics perhaps 4 years ago. Unfortunately this never closed. There continues to be swelling of the foot however this has been stable for a long time. 11/25/15; the patient is here for maintenance surgical debridement of a wound on  Nathan Miller, Nathan Miller ([id number]) Visit Report for 06/08/2020 Debridement Details Patient Name: Date of Service: Nathan Miller, [place] D. 06/08/2020 9:15 A M Medical Record Number: [id number] Patient Account Number: [id number] Date of Birth/Sex: Treating RN: [birth year] (68 y.o. [name]) Carlene Coria Primary Care Provider: Sharmaine Base, [place] Other Clinician: Referring Provider: Treating Provider/Extender: Suann Larry in Treatment: 23 Debridement Performed for Assessment: Wound #3 Right,Plantar Calcaneus Performed By: Physician Tobi Bastos, MD Debridement Type: Debridement Severity of Tissue Pre Debridement: Fat layer exposed Level of Consciousness (Pre-procedure): Awake and Alert Pre-procedure Verification/Time Out Yes - 09:52 Taken: Start Time: 09:52 Pain Control: Lidocaine 5% topical ointment T Area Debrided (L x W): otal 0.8 (cm) x 0.2 (cm) = 0.16 (cm) Tissue and other material debrided: Viable, Non-Viable, Callus, Slough, Subcutaneous, Skin: Dermis , Skin: Epidermis, Slough Level: Skin/Subcutaneous Tissue Debridement Description: Excisional Instrument: Curette Bleeding: Minimum Hemostasis Achieved: Pressure End Time: 09:55 Procedural Pain: 0 Post Procedural Pain: 0 Response to Treatment: Procedure was tolerated well Level of Consciousness (Post- Awake and Alert procedure): Post Debridement Measurements of Total Wound Length: (cm) 0.8 Width: (cm) 0.2 Depth: (cm) 0.6 Volume: (cm) 0.075 Character of Wound/Ulcer Post Debridement: Improved Severity of Tissue Post Debridement: Fat layer exposed Post Procedure Diagnosis Same as Pre-procedure Electronic Signature(s) Signed: 06/08/2020 4:52:06 PM By: Tobi Bastos MD, MBA Signed: 06/08/2020 5:05:11 PM By: Carlene Coria RN Entered By: Carlene Coria on 06/08/2020 09:54:52 -------------------------------------------------------------------------------- HPI Details Patient Name: Date of Service: Nathan Moll, PA UL  D. 06/08/2020 9:15 A M Medical Record Number: [id number] Patient Account Number: [id number] Date of Birth/Sex: Treating RN: [birth year] (68 y.o. Oval Linsey Primary Care Provider: York Pellant [place] Other Clinician: Referring Provider: Treating Provider/Extender: Suann Larry in Treatment: 23 History of Present Illness Location: right foot Severity: Wag 3 Duration: #months Modifying Factors: Diabetic and charcot' foot.. Peripheral vascular disease ssociated Signs and Symptoms: osteo A HPI Description: see chief complaint. 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 This is been present for 5-6 years. He underwent a course of IV any biotics and hyperbaric oxygen roughly 5 years ago. He was offered an amputation by orthopedics and I believe at the time I concurred with this however the patient wishes to up go via wait-and-see approach. He is actually done fairly well over the 5 years is still ambulatory. He uses a silver alginate-based dressing on the foot which he changes himself up. 05/27/15; patient returns for his monthly visit the. The status of his foot and his wound are essentially unchanged this patient has a chronic idiopathic neuropathy. He has underlying osteomyelitis. He had extensive orthopedic surgery roughly 3 or 4 years ago had. He has underlying chronic osteomyelitis.[name] He dresses this with silver alginate. We order the supplies. I continue to see him on a monthly basis for wound debridement. 06/24/15 the patient arrives here today for his monthly wound care check. He has underlying chronic osteomyelitis. The wound was surgically debridement of nonviable circumferential tissue. He is continued with silver alginate based stressing severe this area changed daily by his wife. Post debridement this actually looks as good as I've seen this since quite  some time 07/29/15. The patient has been coming here monthly for many years. He has a chronic wound on his right lateral foot at roughly the mid shaft of his fifth metatarsal. Initially he underwent a code course of IV

## 2020-06-10 LAB — AEROBIC CULTURE W GRAM STAIN (SUPERFICIAL SPECIMEN)

## 2020-06-11 NOTE — Progress Notes (Signed)
Miller, Nathan ([id number]) Visit Report for 06/08/2020 Arrival Information Details Patient Name: Date of Service: Nathan Miller, [place] D. 06/08/2020 9:15 A M Medical Record Number: [id number] Patient Account Number: [id number] Date of Birth/Sex: Treating RN: 1951-06-10 (68 y.o. [name]) Carlene Coria Primary Care Provider: Sharmaine Base, [place] Other Clinician: Referring Provider: Treating Provider/Extender: Suann Larry in Treatment: 23 Visit Information History Since Last Visit Added or deleted any medications: No Patient Arrived: Nathan Miller Any new allergies or adverse reactions: No Arrival Time: 09:05 Had a fall or experienced change in No Accompanied By: wife activities of daily living that Nathan Miller affect Transfer Assistance: None risk of falls: Patient Identification Verified: Yes Signs or symptoms of abuse/neglect since last visito No Secondary Verification Process Completed: Yes Hospitalized since last visit: No Patient Requires Transmission-Based Precautions: No Implantable device outside of the clinic excluding No Patient Has Alerts: No cellular tissue based products placed in the center since last visit: Has Dressing in Place as Prescribed: Yes Pain Present Now: No Electronic Signature(s) Signed: 06/08/2020 10:17:44 AM By: Sandre Kitty Entered By: Sandre Kitty on 06/08/2020 09:07:20 -------------------------------------------------------------------------------- Encounter Discharge Information Details Patient Name: Date of Service: Nathan Moll, PA UL D. 06/08/2020 9:15 A M Medical Record Number: [id number] Patient Account Number: [id number] Date of Birth/Sex: Treating RN: [birth year] (69 y.o. [name] Primary Care Provider: York Pellant [place] Other Clinician: Referring Provider: Treating Provider/Extender: Suann Larry in Treatment: 23 Encounter Discharge Information Items Post Procedure Vitals Discharge  Condition: Stable Temperature (F): 97.6 Ambulatory Status: Ambulatory Pulse (bpm): 78 Discharge Destination: Home Respiratory Rate (breaths/min): 18 Transportation: Private Auto Blood Pressure (mmHg): 174/92 Accompanied By: wife Schedule Follow-up Appointment: Yes Clinical Summary of Care: Patient Declined Electronic Signature(s) Signed: 06/08/2020 5:12:21 PM By: Kela Millin Entered By: Kela Millin on 06/08/2020 10:13:49 -------------------------------------------------------------------------------- Lower Extremity Assessment Details Patient Name: Date of Service: Nathan Moll, PA UL D. 06/08/2020 9:15 A M Medical Record Number: [id number] Patient Account Number: [id number] Date of Birth/Sex: Treating RN: 05/03/1951 (69 y.o. [name] Primary Care Provider: York Pellant [place] Other Clinician: Referring Provider: Treating Provider/Extender: Suann Larry in Treatment: 23 Edema Assessment Assessed: [Left: No] [Right: No] Edema: [Left: Ye] [Right: s] Calf Left: Right: Point of Measurement: cm From Medial Instep cm 42.5 cm Ankle Left: Right: Point of Measurement: cm From Medial Instep cm 28 cm Vascular Assessment Pulses: Dorsalis Pedis Palpable: [Right:Yes] Electronic Signature(s) Signed: 06/11/2020 5:19:54 PM By: Levan Hurst RN, BSN Entered By: Levan Hurst on 06/08/2020 09:19:44 -------------------------------------------------------------------------------- [place] Details Patient Name: Date of Service: Nathan Moll, PA UL D. 06/08/2020 9:15 A M Medical Record Number: [id number] Patient Account Number: [id number] Date of Birth/Sex: Treating RN: January 13, 1951 (68 y.o. [name]) Carlene Coria Primary Care Provider: York Pellant [place] Other Clinician: Referring Provider: Treating Provider/Extender: Suann Larry in Treatment: 23 Active Inactive Wound/Skin Impairment Nursing  Diagnoses: Impaired tissue integrity Knowledge deficit related to ulceration/compromised skin integrity Goals: Patient/caregiver will verbalize understanding of skin care regimen Date Initiated: 12/29/2019 Target Resolution Date: 06/25/2020 Goal Status: Active Ulcer/skin breakdown will have a volume reduction of 30% by week 4 Date Initiated: 12/29/2019 Date Inactivated: 02/09/2020 Target Resolution Date: 01/30/2020 Goal Status: Met Interventions: Assess patient/caregiver ability to obtain necessary supplies Assess patient/caregiver ability to perform ulcer/skin care regimen upon admission and as needed Assess ulceration(s) every visit Provide education on ulcer and skin care Notes: Electronic Signature(s) Signed: 06/08/2020 5:05:11 PM By: Carlene Coria RN Entered  Miller, Nathan ([id number]) Visit Report for 06/08/2020 Arrival Information Details Patient Name: Date of Service: Nathan Miller, [place] D. 06/08/2020 9:15 A M Medical Record Number: [id number] Patient Account Number: [id number] Date of Birth/Sex: Treating RN: 1951-06-10 (68 y.o. [name]) Carlene Coria Primary Care Provider: Sharmaine Base, [place] Other Clinician: Referring Provider: Treating Provider/Extender: Suann Larry in Treatment: 23 Visit Information History Since Last Visit Added or deleted any medications: No Patient Arrived: Nathan Miller Any new allergies or adverse reactions: No Arrival Time: 09:05 Had a fall or experienced change in No Accompanied By: wife activities of daily living that Nathan Miller affect Transfer Assistance: None risk of falls: Patient Identification Verified: Yes Signs or symptoms of abuse/neglect since last visito No Secondary Verification Process Completed: Yes Hospitalized since last visit: No Patient Requires Transmission-Based Precautions: No Implantable device outside of the clinic excluding No Patient Has Alerts: No cellular tissue based products placed in the center since last visit: Has Dressing in Place as Prescribed: Yes Pain Present Now: No Electronic Signature(s) Signed: 06/08/2020 10:17:44 AM By: Sandre Kitty Entered By: Sandre Kitty on 06/08/2020 09:07:20 -------------------------------------------------------------------------------- Encounter Discharge Information Details Patient Name: Date of Service: Nathan Moll, PA UL D. 06/08/2020 9:15 A M Medical Record Number: [id number] Patient Account Number: [id number] Date of Birth/Sex: Treating RN: [birth year] (69 y.o. [name] Primary Care Provider: York Pellant [place] Other Clinician: Referring Provider: Treating Provider/Extender: Suann Larry in Treatment: 23 Encounter Discharge Information Items Post Procedure Vitals Discharge  Condition: Stable Temperature (F): 97.6 Ambulatory Status: Ambulatory Pulse (bpm): 78 Discharge Destination: Home Respiratory Rate (breaths/min): 18 Transportation: Private Auto Blood Pressure (mmHg): 174/92 Accompanied By: wife Schedule Follow-up Appointment: Yes Clinical Summary of Care: Patient Declined Electronic Signature(s) Signed: 06/08/2020 5:12:21 PM By: Kela Millin Entered By: Kela Millin on 06/08/2020 10:13:49 -------------------------------------------------------------------------------- Lower Extremity Assessment Details Patient Name: Date of Service: Nathan Moll, PA UL D. 06/08/2020 9:15 A M Medical Record Number: [id number] Patient Account Number: [id number] Date of Birth/Sex: Treating RN: 05/03/1951 (69 y.o. [name] Primary Care Provider: York Pellant [place] Other Clinician: Referring Provider: Treating Provider/Extender: Suann Larry in Treatment: 23 Edema Assessment Assessed: [Left: No] [Right: No] Edema: [Left: Ye] [Right: s] Calf Left: Right: Point of Measurement: cm From Medial Instep cm 42.5 cm Ankle Left: Right: Point of Measurement: cm From Medial Instep cm 28 cm Vascular Assessment Pulses: Dorsalis Pedis Palpable: [Right:Yes] Electronic Signature(s) Signed: 06/11/2020 5:19:54 PM By: Levan Hurst RN, BSN Entered By: Levan Hurst on 06/08/2020 09:19:44 -------------------------------------------------------------------------------- [place] Details Patient Name: Date of Service: Nathan Moll, PA UL D. 06/08/2020 9:15 A M Medical Record Number: [id number] Patient Account Number: [id number] Date of Birth/Sex: Treating RN: January 13, 1951 (68 y.o. [name]) Carlene Coria Primary Care Provider: York Pellant [place] Other Clinician: Referring Provider: Treating Provider/Extender: Suann Larry in Treatment: 23 Active Inactive Wound/Skin Impairment Nursing  Diagnoses: Impaired tissue integrity Knowledge deficit related to ulceration/compromised skin integrity Goals: Patient/caregiver will verbalize understanding of skin care regimen Date Initiated: 12/29/2019 Target Resolution Date: 06/25/2020 Goal Status: Active Ulcer/skin breakdown will have a volume reduction of 30% by week 4 Date Initiated: 12/29/2019 Date Inactivated: 02/09/2020 Target Resolution Date: 01/30/2020 Goal Status: Met Interventions: Assess patient/caregiver ability to obtain necessary supplies Assess patient/caregiver ability to perform ulcer/skin care regimen upon admission and as needed Assess ulceration(s) every visit Provide education on ulcer and skin care Notes: Electronic Signature(s) Signed: 06/08/2020 5:05:11 PM By: Carlene Coria RN Entered

## 2020-06-24 ENCOUNTER — Ambulatory Visit (INDEPENDENT_AMBULATORY_CARE_PROVIDER_SITE_OTHER): Payer: PPO | Admitting: *Deleted

## 2020-06-24 DIAGNOSIS — I442 Atrioventricular block, complete: Secondary | ICD-10-CM

## 2020-06-28 LAB — CUP PACEART REMOTE DEVICE CHECK
Battery Remaining Longevity: 69 mo
Battery Voltage: 3 V
Brady Statistic RV Percent Paced: 99.81 %
Date Time Interrogation Session: 20210830112559
Implantable Lead Implant Date: 20160325
Implantable Lead Location: 753860
Implantable Lead Model: 5076
Implantable Pulse Generator Implant Date: 20160325
Lead Channel Impedance Value: 475 Ohm
Lead Channel Impedance Value: 532 Ohm
Lead Channel Pacing Threshold Amplitude: 0.625 V
Lead Channel Pacing Threshold Pulse Width: 0.4 ms
Lead Channel Sensing Intrinsic Amplitude: 21.75 mV
Lead Channel Sensing Intrinsic Amplitude: 21.75 mV
Lead Channel Setting Pacing Amplitude: 2.5 V
Lead Channel Setting Pacing Pulse Width: 0.4 ms
Lead Channel Setting Sensing Sensitivity: 0.9 mV

## 2020-06-29 NOTE — Progress Notes (Signed)
Remote pacemaker transmission.   

## 2020-07-06 ENCOUNTER — Encounter (HOSPITAL_BASED_OUTPATIENT_CLINIC_OR_DEPARTMENT_OTHER): Payer: PPO | Admitting: Internal Medicine

## 2020-07-12 ENCOUNTER — Encounter (HOSPITAL_BASED_OUTPATIENT_CLINIC_OR_DEPARTMENT_OTHER): Payer: PPO | Admitting: Internal Medicine

## 2020-07-20 ENCOUNTER — Other Ambulatory Visit: Payer: Self-pay

## 2020-07-20 ENCOUNTER — Encounter (HOSPITAL_BASED_OUTPATIENT_CLINIC_OR_DEPARTMENT_OTHER): Payer: PPO | Attending: Internal Medicine | Admitting: Internal Medicine

## 2020-07-20 DIAGNOSIS — E11610 Type 2 diabetes mellitus with diabetic neuropathic arthropathy: Secondary | ICD-10-CM | POA: Diagnosis not present

## 2020-07-20 DIAGNOSIS — I890 Lymphedema, not elsewhere classified: Secondary | ICD-10-CM | POA: Insufficient documentation

## 2020-07-20 DIAGNOSIS — F419 Anxiety disorder, unspecified: Secondary | ICD-10-CM | POA: Diagnosis not present

## 2020-07-20 DIAGNOSIS — E1142 Type 2 diabetes mellitus with diabetic polyneuropathy: Secondary | ICD-10-CM | POA: Diagnosis not present

## 2020-07-20 DIAGNOSIS — Z950 Presence of cardiac pacemaker: Secondary | ICD-10-CM | POA: Insufficient documentation

## 2020-07-20 DIAGNOSIS — I2510 Atherosclerotic heart disease of native coronary artery without angina pectoris: Secondary | ICD-10-CM | POA: Diagnosis not present

## 2020-07-20 DIAGNOSIS — E11621 Type 2 diabetes mellitus with foot ulcer: Secondary | ICD-10-CM | POA: Diagnosis not present

## 2020-07-20 DIAGNOSIS — E1151 Type 2 diabetes mellitus with diabetic peripheral angiopathy without gangrene: Secondary | ICD-10-CM | POA: Insufficient documentation

## 2020-07-20 DIAGNOSIS — L97512 Non-pressure chronic ulcer of other part of right foot with fat layer exposed: Secondary | ICD-10-CM | POA: Insufficient documentation

## 2020-07-20 DIAGNOSIS — Z87891 Personal history of nicotine dependence: Secondary | ICD-10-CM | POA: Insufficient documentation

## 2020-07-20 DIAGNOSIS — I252 Old myocardial infarction: Secondary | ICD-10-CM | POA: Insufficient documentation

## 2020-07-20 DIAGNOSIS — M1990 Unspecified osteoarthritis, unspecified site: Secondary | ICD-10-CM | POA: Insufficient documentation

## 2020-07-20 DIAGNOSIS — I10 Essential (primary) hypertension: Secondary | ICD-10-CM | POA: Diagnosis not present

## 2020-07-20 DIAGNOSIS — M869 Osteomyelitis, unspecified: Secondary | ICD-10-CM | POA: Insufficient documentation

## 2020-07-20 DIAGNOSIS — E1169 Type 2 diabetes mellitus with other specified complication: Secondary | ICD-10-CM | POA: Diagnosis not present

## 2020-07-20 DIAGNOSIS — L97412 Non-pressure chronic ulcer of right heel and midfoot with fat layer exposed: Secondary | ICD-10-CM | POA: Diagnosis not present

## 2020-07-20 NOTE — Progress Notes (Signed)
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 07/20/2020 9:30 A M Medical Record Number: [id number] Patient Account Number: [id number] Date of Birth/Sex: Treating RN: 03-30-1951 (68 y.o. [name]) Carlene Coria Primary Care Provider: York Pellant [place] Other Clinician: Referring Provider: Treating Provider/Extender: Kris Hartmann, [place] Weeks in Treatment: 29 Subjective History of Present Illness (HPI) The following HPI elements were documented for the patient's wound: Location: right foot Severity: Wag 3 Duration: #months Modifying Factors: Diabetic and charcot' foot.. Peripheral vascular disease Associated Signs and  Symptoms: osteo see chief complaint. 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 This is been present for 5-6 years. He underwent a course of IV any biotics and hyperbaric oxygen roughly 5 years ago. He was offered an amputation by orthopedics and I believe at the time I concurred with this however the patient wishes to up go via wait-and-see approach. He is actually done fairly well over the 5 years is still ambulatory. He uses a silver alginate-based dressing on the foot which he changes himself up. 05/27/15; patient returns for his monthly visit the. The status of his foot and his wound are essentially unchanged this patient has a chronic idiopathic neuropathy. He has underlying osteomyelitis. He had extensive orthopedic surgery roughly 3 or 4 years ago had. He has underlying chronic osteomyelitis.[name] He dresses this with silver alginate. We order the supplies. I continue to see him on a monthly basis for wound debridement. 06/24/15 the patient arrives here today for his monthly wound care check. He has underlying chronic osteomyelitis. The wound was surgically debridement of nonviable circumferential tissue. He is continued with silver alginate based stressing severe this area changed daily by his wife. Post debridement this actually looks as good as I've seen this since quite some time 07/29/15. The patient has been coming here monthly for many years. He has a chronic wound on his right lateral foot at roughly the mid shaft of his fifth metatarsal. Initially he underwent a code course of IV antibiotics hyperbaric oxygen, resection I believe of his fifth and fourth metatarsal for chronic osteomyelitis. 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 He was offered an amputation at some point but refused. He comes here for "palliative" wound care which requires extensive debridement of necrotic  subcutaneous tissue to clean up the wound bed. He has been using silver alginate based dressings at home for many years. Although he has extensive edema of his foot, the situation has overall remained stable perhaps surprisingly so. 09/16/15; the patient has been coming here for many years. He is a chronic wound in his right lateral foot roughly the mid shaft of the fifth metatarsal. He is had previous surgery on this with resection. He clearly has underlying chronic osteomyelitis. An amputation was recommended years ago which she refused MRI to list the  foot surgery. He has a wound on the plantar aspect of his heel laterally. I think this is a really an offloading issue more than anything else. Nevertheless the last time he was here he had some purulent drainage there was a culture done which showed a Citrobacter species. This was missed in our clinic and only came to my attention on 9/13. I sent in a prescription for ciprofloxacin to his [place] however for some reason [facility] is backordered on ciprofloxacin. Therefore he is never received it. He didn't call us nor did [facility] call us or fax [place] for that matter. He is using silver alginate to the wound which I think is better in terms of surface area Electronic Signature(s) Signed: 07/20/2020 4:19:57 PM By: Linton Ham MD Entered By: Linton Ham on 07/20/2020 10:35:48 -------------------------------------------------------------------------------- Physical Exam Details Patient Name: Date of Service: [name], PA UL D. 07/20/2020 9:30 A M Medical Record Number: [id number] Patient Account Number: [id number] Date of Birth/Sex: Treating RN: Mar 23, 1951 (68 y.o. [name]) Carlene Coria Primary Care Provider: York Pellant [place] Other Clinician: Referring Provider: Treating Provider/Extender: Linton Ham [place], [place] Weeks in Treatment: 29 Constitutional Patient is hypotensive.. Pulse regular and within target range for patient.[name] Respirations regular, non-labored and within target range.. Temperature is normal and within the target range for the patient.[name] Appears in no distress. Cardiovascular Pedal pulses are palpable on the right. Notes Wound exam; I used a #5 curette remove the overhanging edges of this wound and fibrinous debris on the wound surface hemostasis  with silver nitrate. In general the surface area of this is better. There is no current evidence of infection there was no purulent drainage no erythema around the wound Electronic Signature(s) Signed: 07/20/2020 4:19:57 PM By: Linton Ham MD Entered By: Linton Ham on 07/20/2020 10:36:56 -------------------------------------------------------------------------------- Physician Orders Details Patient Name: Date of Service: [name], PA UL D. 07/20/2020 9:30 A M Medical Record Number: [id number] Patient Account Number: [id number] Date of Birth/Sex: Treating RN: [birth year] (68 y.o. 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 Dressing Change Frequency Wound #3 Right,Plantar Calcaneus Change dressing every day. Wound Cleansing Wound #3 Right,Plantar Calcaneus May shower and wash wound with soap and water. - on days that dressing is changed Primary Wound Dressing Wound #3 Right,Plantar Calcaneus Calcium Alginate with Silver Secondary Dressing Wound #3 Right,Plantar Calcaneus Foam - foam donut Kerlix/Rolled Gauze - secure with tape Dry Gauze Other: - ace wrap over dressing Off-Loading Turn and reposition every 2 hours Other: - float heels off of bed/chair with pillow under calves Patient Medications llergies: penicillin A Notifications Medication Indication Start End 07/20/2020 Cipro DOSE oral 500 mg tablet - 1 tablet oral bid for 5 days Electronic  Signature(s) Signed: 07/20/2020 10:08:22 AM By: Linton Ham MD Entered By: Linton Ham on 07/20/2020 10:08:21 -------------------------------------------------------------------------------- Problem List Details Patient Name: Date of Service: [name], PA UL D. 07/20/2020 9:30 A M Medical Record Number: [id number] Patient Account Number: [id number] Date of Birth/Sex: Treating RN: Apr 16, 1951 (68 y.o. [name]) Carlene Coria Primary Care Provider: Sharmaine Base, [place] Other  TASHAWN, LASWELL ([id number]) Visit Report for 07/20/2020 Debridement Details Patient Name: Date of Service: [name], [place] D. 07/20/2020 9:30 A M Medical Record Number: [id number] Patient Account Number: [id number] Date of Birth/Sex: Treating RN: 11-Dec-1950 (68 y.o. [name]) Carlene Coria Primary Care Provider: York Pellant [place] Other Clinician: Referring Provider: Treating Provider/Extender: Kris Hartmann, [place] Weeks in Treatment: 29 Debridement Performed for Assessment: Wound #3 Right,Plantar Calcaneus Performed By: Physician Ricard Dillon., MD Debridement Type: Debridement Severity of Tissue Pre Debridement: Fat layer exposed Level of Consciousness (Pre-procedure): Awake and Alert Pre-procedure Verification/Time Out Yes - 09:53 Taken: Start Time: 09:53 T Area Debrided (L x W): otal 1.2 (cm) x 0.3 (cm) = 0.36 (cm) Tissue and other material debrided: Viable, Non-Viable, Callus, Slough, Subcutaneous, Skin: Dermis , Skin: Epidermis, Slough Level: Skin/Subcutaneous Tissue Debridement Description: Excisional Instrument: Curette Bleeding: Moderate Hemostasis Achieved: Silver Nitrate End Time: 09:57 Procedural Pain: 2 Post Procedural Pain: 0 Response to Treatment: Procedure was tolerated well Level of Consciousness (Post- Awake and Alert procedure): Post Debridement Measurements of Total Wound Length: (cm) 1.2 Width: (cm) 0.3 Depth: (cm) 0.3 Volume: (cm) 0.085 Character of Wound/Ulcer Post Debridement: Improved Severity of Tissue Post Debridement: Fat layer exposed Post Procedure Diagnosis Same as Pre-procedure Electronic Signature(s) Signed: 07/20/2020 4:19:57 PM By: Linton Ham MD Signed: 07/20/2020 4:24:48 PM By: Carlene Coria RN Entered By: Linton Ham on 07/20/2020 10:32:45 -------------------------------------------------------------------------------- HPI Details Patient Name: Date of Service: [name], PA UL D. 07/20/2020 9:30 A  M Medical Record Number: [id number] Patient Account Number: [id number] Date of Birth/Sex: Treating RN: Mar 23, 1951 (68 y.o. [name]) Carlene Coria Primary Care Provider: York Pellant [place] Other Clinician: Referring Provider: Treating Provider/Extender: Kris Hartmann, [place] Weeks in Treatment: 29 History of Present Illness Location: right foot Severity: Wag 3 Duration: #months Modifying Factors: Diabetic and charcot' foot.. Peripheral vascular disease ssociated Signs and Symptoms: osteo A HPI Description: see chief complaint. 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 This is been present for 5-6 years. He underwent a course of IV any biotics and hyperbaric oxygen roughly 5 years ago. He was offered an amputation by orthopedics and I believe at the time I concurred with this however the patient wishes to up go via wait-and-see approach. He is actually done fairly well over the 5 years is still ambulatory. He uses a silver alginate-based dressing on the foot which he changes himself up. 05/27/15; patient returns for his monthly visit the. The status of his foot and his wound are essentially unchanged this patient has a chronic idiopathic neuropathy. He has underlying osteomyelitis. He had extensive orthopedic surgery roughly 3 or 4 years ago had. He has underlying chronic osteomyelitis.[name] He dresses this with silver alginate. We order the supplies. I continue to see him on a monthly basis for wound debridement. 06/24/15 the patient arrives here today for his monthly wound care check. He has underlying chronic osteomyelitis. The wound was surgically debridement of nonviable circumferential tissue. He is continued with silver alginate based stressing severe this area changed daily by his wife. Post debridement this actually looks as good as I've seen this since quite some  time 07/29/15. The patient has been coming here monthly for many years. He has a chronic wound on his right lateral foot at roughly the mid shaft of his fifth metatarsal. 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 07/20/2020 9:30 A M Medical Record Number: [id number] Patient Account Number: [id number] Date of Birth/Sex: Treating RN: 03-30-1951 (68 y.o. [name]) Carlene Coria Primary Care Provider: York Pellant [place] Other Clinician: Referring Provider: Treating Provider/Extender: Kris Hartmann, [place] Weeks in Treatment: 29 Subjective History of Present Illness (HPI) The following HPI elements were documented for the patient's wound: Location: right foot Severity: Wag 3 Duration: #months Modifying Factors: Diabetic and charcot' foot.. Peripheral vascular disease Associated Signs and  Symptoms: osteo see chief complaint. 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 This is been present for 5-6 years. He underwent a course of IV any biotics and hyperbaric oxygen roughly 5 years ago. He was offered an amputation by orthopedics and I believe at the time I concurred with this however the patient wishes to up go via wait-and-see approach. He is actually done fairly well over the 5 years is still ambulatory. He uses a silver alginate-based dressing on the foot which he changes himself up. 05/27/15; patient returns for his monthly visit the. The status of his foot and his wound are essentially unchanged this patient has a chronic idiopathic neuropathy. He has underlying osteomyelitis. He had extensive orthopedic surgery roughly 3 or 4 years ago had. He has underlying chronic osteomyelitis.[name] He dresses this with silver alginate. We order the supplies. I continue to see him on a monthly basis for wound debridement. 06/24/15 the patient arrives here today for his monthly wound care check. He has underlying chronic osteomyelitis. The wound was surgically debridement of nonviable circumferential tissue. He is continued with silver alginate based stressing severe this area changed daily by his wife. Post debridement this actually looks as good as I've seen this since quite some time 07/29/15. The patient has been coming here monthly for many years. He has a chronic wound on his right lateral foot at roughly the mid shaft of his fifth metatarsal. Initially he underwent a code course of IV antibiotics hyperbaric oxygen, resection I believe of his fifth and fourth metatarsal for chronic osteomyelitis. 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 He was offered an amputation at some point but refused. He comes here for "palliative" wound care which requires extensive debridement of necrotic  subcutaneous tissue to clean up the wound bed. He has been using silver alginate based dressings at home for many years. Although he has extensive edema of his foot, the situation has overall remained stable perhaps surprisingly so. 09/16/15; the patient has been coming here for many years. He is a chronic wound in his right lateral foot roughly the mid shaft of the fifth metatarsal. He is had previous surgery on this with resection. He clearly has underlying chronic osteomyelitis. 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 07/20/2020 9:30 A M Medical Record Number: [id number] Patient Account Number: [id number] Date of Birth/Sex: Treating RN: 03-30-1951 (68 y.o. [name]) Carlene Coria Primary Care Provider: York Pellant [place] Other Clinician: Referring Provider: Treating Provider/Extender: Kris Hartmann, [place] Weeks in Treatment: 29 Subjective History of Present Illness (HPI) The following HPI elements were documented for the patient's wound: Location: right foot Severity: Wag 3 Duration: #months Modifying Factors: Diabetic and charcot' foot.. Peripheral vascular disease Associated Signs and  Symptoms: osteo see chief complaint. 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 This is been present for 5-6 years. He underwent a course of IV any biotics and hyperbaric oxygen roughly 5 years ago. He was offered an amputation by orthopedics and I believe at the time I concurred with this however the patient wishes to up go via wait-and-see approach. He is actually done fairly well over the 5 years is still ambulatory. He uses a silver alginate-based dressing on the foot which he changes himself up. 05/27/15; patient returns for his monthly visit the. The status of his foot and his wound are essentially unchanged this patient has a chronic idiopathic neuropathy. He has underlying osteomyelitis. He had extensive orthopedic surgery roughly 3 or 4 years ago had. He has underlying chronic osteomyelitis.[name] He dresses this with silver alginate. We order the supplies. I continue to see him on a monthly basis for wound debridement. 06/24/15 the patient arrives here today for his monthly wound care check. He has underlying chronic osteomyelitis. The wound was surgically debridement of nonviable circumferential tissue. He is continued with silver alginate based stressing severe this area changed daily by his wife. Post debridement this actually looks as good as I've seen this since quite some time 07/29/15. The patient has been coming here monthly for many years. He has a chronic wound on his right lateral foot at roughly the mid shaft of his fifth metatarsal. Initially he underwent a code course of IV antibiotics hyperbaric oxygen, resection I believe of his fifth and fourth metatarsal for chronic osteomyelitis. 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 He was offered an amputation at some point but refused. He comes here for "palliative" wound care which requires extensive debridement of necrotic  subcutaneous tissue to clean up the wound bed. He has been using silver alginate based dressings at home for many years. Although he has extensive edema of his foot, the situation has overall remained stable perhaps surprisingly so. 09/16/15; the patient has been coming here for many years. He is a chronic wound in his right lateral foot roughly the mid shaft of the fifth metatarsal. He is had previous surgery on this with resection. He clearly has underlying chronic osteomyelitis. 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 07/20/2020 9:30 A M Medical Record Number: [id number] Patient Account Number: [id number] Date of Birth/Sex: Treating RN: 03-30-1951 (68 y.o. [name]) Carlene Coria Primary Care Provider: York Pellant [place] Other Clinician: Referring Provider: Treating Provider/Extender: Kris Hartmann, [place] Weeks in Treatment: 29 Subjective History of Present Illness (HPI) The following HPI elements were documented for the patient's wound: Location: right foot Severity: Wag 3 Duration: #months Modifying Factors: Diabetic and charcot' foot.. Peripheral vascular disease Associated Signs and  Symptoms: osteo see chief complaint. 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 This is been present for 5-6 years. He underwent a course of IV any biotics and hyperbaric oxygen roughly 5 years ago. He was offered an amputation by orthopedics and I believe at the time I concurred with this however the patient wishes to up go via wait-and-see approach. He is actually done fairly well over the 5 years is still ambulatory. He uses a silver alginate-based dressing on the foot which he changes himself up. 05/27/15; patient returns for his monthly visit the. The status of his foot and his wound are essentially unchanged this patient has a chronic idiopathic neuropathy. He has underlying osteomyelitis. He had extensive orthopedic surgery roughly 3 or 4 years ago had. He has underlying chronic osteomyelitis.[name] He dresses this with silver alginate. We order the supplies. I continue to see him on a monthly basis for wound debridement. 06/24/15 the patient arrives here today for his monthly wound care check. He has underlying chronic osteomyelitis. The wound was surgically debridement of nonviable circumferential tissue. He is continued with silver alginate based stressing severe this area changed daily by his wife. Post debridement this actually looks as good as I've seen this since quite some time 07/29/15. The patient has been coming here monthly for many years. He has a chronic wound on his right lateral foot at roughly the mid shaft of his fifth metatarsal. Initially he underwent a code course of IV antibiotics hyperbaric oxygen, resection I believe of his fifth and fourth metatarsal for chronic osteomyelitis. 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 He was offered an amputation at some point but refused. He comes here for "palliative" wound care which requires extensive debridement of necrotic  subcutaneous tissue to clean up the wound bed. He has been using silver alginate based dressings at home for many years. Although he has extensive edema of his foot, the situation has overall remained stable perhaps surprisingly so. 09/16/15; the patient has been coming here for many years. He is a chronic wound in his right lateral foot roughly the mid shaft of the fifth metatarsal. He is had previous surgery on this with resection. He clearly has underlying chronic osteomyelitis. An amputation was recommended years ago which she refused MRI to list the  would heal quicker although he is always refused to consider a total contact cast 4. Back in 1 month Electronic Signature(s) Signed: 07/20/2020 4:19:57 PM By: Linton Ham MD Entered By: Linton Ham on 07/20/2020 10:38:14 -------------------------------------------------------------------------------- SuperBill Details Patient Name: Date of Service: [name], [place] D. 07/20/2020 Medical Record Number: [id number] Patient Account Number: [id number] Date of Birth/Sex: Treating RN: 1951/05/18 (68 y.o. 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, right ankle and foot Facility Procedures CPT4 Code: [id number] Description: 01410 - DEB SUBQ TISSUE 20 SQ CM/< ICD-10 Diagnosis Description 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 He has a wound on the plantar aspect of his heel laterally. I think this is a really an offloading issue more than anything else. Nevertheless the last time he was here he had some purulent drainage there was a culture done which showed a Citrobacter species. This was missed in our clinic and only came to my attention on 9/13. I sent in a prescription for ciprofloxacin to his [place] however for some reason [facility] is backordered on ciprofloxacin. Therefore he is never received it. He didn't call us nor did [facility] call us or fax [place] for that matter. He is using silver alginate to the wound which I think is better in terms of surface area Electronic Signature(s) Signed: 07/20/2020 4:19:57 PM By: Linton Ham MD Entered By: Linton Ham on 07/20/2020 10:35:48 -------------------------------------------------------------------------------- Physical Exam Details Patient Name: Date of Service: [name], [place] UL D. 07/20/2020 9:30 A M Medical Record Number: [id number] Patient Account Number: [id number] Date of Birth/Sex: Treating RN: Mar 23, 1951 (68 y.o. [name]) Carlene Coria Primary Care Provider: York Pellant [place] Other Clinician: Referring Provider: Treating Provider/Extender: Linton Ham [place], [place] Weeks in Treatment: 29 Constitutional Patient is hypotensive.. Pulse regular and within target range for patient.[name] Respirations regular, non-labored and within target range.. Temperature is normal and within the target range for the patient.[name] Appears in no distress. Cardiovascular Pedal pulses are palpable on the right. Notes Wound exam; I used a #5 curette remove the overhanging edges of this wound and fibrinous debris on the wound surface hemostasis  with silver nitrate. In general the surface area of this is better. There is no current evidence of infection there was no purulent drainage no erythema around the wound Electronic Signature(s) Signed: 07/20/2020 4:19:57 PM By: Linton Ham MD Entered By: Linton Ham on 07/20/2020 10:36:56 -------------------------------------------------------------------------------- Physician Orders Details Patient Name: Date of Service: [name], PA UL D. 07/20/2020 9:30 A M Medical Record Number: [id number] Patient Account Number: [id number] Date of Birth/Sex: Treating RN: [birth year] (68 y.o. 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 Dressing Change Frequency Wound #3 Right,Plantar Calcaneus Change dressing every day. Wound Cleansing Wound #3 Right,Plantar Calcaneus May shower and wash wound with soap and water. - on days that dressing is changed Primary Wound Dressing Wound #3 Right,Plantar Calcaneus Calcium Alginate with Silver Secondary Dressing Wound #3 Right,Plantar Calcaneus Foam - foam donut Kerlix/Rolled Gauze - secure with tape Dry Gauze Other: - ace wrap over dressing Off-Loading Turn and reposition every 2 hours Other: - float heels off of bed/chair with pillow under calves Patient Medications llergies: penicillin A Notifications Medication Indication Start End 07/20/2020 Cipro DOSE oral 500 mg tablet - 1 tablet oral bid for 5 days Electronic  Signature(s) Signed: 07/20/2020 10:08:22 AM By: Linton Ham MD Entered By: Linton Ham on 07/20/2020 10:08:21 -------------------------------------------------------------------------------- Problem List Details Patient Name: Date of Service: [name], PA UL D. 07/20/2020 9:30 A M Medical Record Number: [id number] Patient Account Number: [id number] Date of Birth/Sex: Treating RN: Apr 16, 1951 (68 y.o. [name]) Carlene Coria Primary Care Provider: Sharmaine Base, [place] Other  foot surgery. He has a wound on the plantar aspect of his heel laterally. I think this is a really an offloading issue more than anything else. Nevertheless the last time he was here he had some purulent drainage there was a culture done which showed a Citrobacter species. This was missed in our clinic and only came to my attention on 9/13. I sent in a prescription for ciprofloxacin to his [place] however for some reason [facility] is backordered on ciprofloxacin. Therefore he is never received it. He didn't call us nor did [facility] call us or fax [place] for that matter. He is using silver alginate to the wound which I think is better in terms of surface area Electronic Signature(s) Signed: 07/20/2020 4:19:57 PM By: Linton Ham MD Entered By: Linton Ham on 07/20/2020 10:35:48 -------------------------------------------------------------------------------- Physical Exam Details Patient Name: Date of Service: [name], PA UL D. 07/20/2020 9:30 A M Medical Record Number: [id number] Patient Account Number: [id number] Date of Birth/Sex: Treating RN: Mar 23, 1951 (68 y.o. [name]) Carlene Coria Primary Care Provider: York Pellant [place] Other Clinician: Referring Provider: Treating Provider/Extender: Linton Ham [place], [place] Weeks in Treatment: 29 Constitutional Patient is hypotensive.. Pulse regular and within target range for patient.[name] Respirations regular, non-labored and within target range.. Temperature is normal and within the target range for the patient.[name] Appears in no distress. Cardiovascular Pedal pulses are palpable on the right. Notes Wound exam; I used a #5 curette remove the overhanging edges of this wound and fibrinous debris on the wound surface hemostasis  with silver nitrate. In general the surface area of this is better. There is no current evidence of infection there was no purulent drainage no erythema around the wound Electronic Signature(s) Signed: 07/20/2020 4:19:57 PM By: Linton Ham MD Entered By: Linton Ham on 07/20/2020 10:36:56 -------------------------------------------------------------------------------- Physician Orders Details Patient Name: Date of Service: [name], PA UL D. 07/20/2020 9:30 A M Medical Record Number: [id number] Patient Account Number: [id number] Date of Birth/Sex: Treating RN: [birth year] (68 y.o. 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 Dressing Change Frequency Wound #3 Right,Plantar Calcaneus Change dressing every day. Wound Cleansing Wound #3 Right,Plantar Calcaneus May shower and wash wound with soap and water. - on days that dressing is changed Primary Wound Dressing Wound #3 Right,Plantar Calcaneus Calcium Alginate with Silver Secondary Dressing Wound #3 Right,Plantar Calcaneus Foam - foam donut Kerlix/Rolled Gauze - secure with tape Dry Gauze Other: - ace wrap over dressing Off-Loading Turn and reposition every 2 hours Other: - float heels off of bed/chair with pillow under calves Patient Medications llergies: penicillin A Notifications Medication Indication Start End 07/20/2020 Cipro DOSE oral 500 mg tablet - 1 tablet oral bid for 5 days Electronic  Signature(s) Signed: 07/20/2020 10:08:22 AM By: Linton Ham MD Entered By: Linton Ham on 07/20/2020 10:08:21 -------------------------------------------------------------------------------- Problem List Details Patient Name: Date of Service: [name], PA UL D. 07/20/2020 9:30 A M Medical Record Number: [id number] Patient Account Number: [id number] Date of Birth/Sex: Treating RN: Apr 16, 1951 (68 y.o. [name]) Carlene Coria Primary Care Provider: Sharmaine Base, [place] Other  TASHAWN, LASWELL ([id number]) Visit Report for 07/20/2020 Debridement Details Patient Name: Date of Service: [name], [place] D. 07/20/2020 9:30 A M Medical Record Number: [id number] Patient Account Number: [id number] Date of Birth/Sex: Treating RN: 11-Dec-1950 (68 y.o. [name]) Carlene Coria Primary Care Provider: York Pellant [place] Other Clinician: Referring Provider: Treating Provider/Extender: Kris Hartmann, [place] Weeks in Treatment: 29 Debridement Performed for Assessment: Wound #3 Right,Plantar Calcaneus Performed By: Physician Ricard Dillon., MD Debridement Type: Debridement Severity of Tissue Pre Debridement: Fat layer exposed Level of Consciousness (Pre-procedure): Awake and Alert Pre-procedure Verification/Time Out Yes - 09:53 Taken: Start Time: 09:53 T Area Debrided (L x W): otal 1.2 (cm) x 0.3 (cm) = 0.36 (cm) Tissue and other material debrided: Viable, Non-Viable, Callus, Slough, Subcutaneous, Skin: Dermis , Skin: Epidermis, Slough Level: Skin/Subcutaneous Tissue Debridement Description: Excisional Instrument: Curette Bleeding: Moderate Hemostasis Achieved: Silver Nitrate End Time: 09:57 Procedural Pain: 2 Post Procedural Pain: 0 Response to Treatment: Procedure was tolerated well Level of Consciousness (Post- Awake and Alert procedure): Post Debridement Measurements of Total Wound Length: (cm) 1.2 Width: (cm) 0.3 Depth: (cm) 0.3 Volume: (cm) 0.085 Character of Wound/Ulcer Post Debridement: Improved Severity of Tissue Post Debridement: Fat layer exposed Post Procedure Diagnosis Same as Pre-procedure Electronic Signature(s) Signed: 07/20/2020 4:19:57 PM By: Linton Ham MD Signed: 07/20/2020 4:24:48 PM By: Carlene Coria RN Entered By: Linton Ham on 07/20/2020 10:32:45 -------------------------------------------------------------------------------- HPI Details Patient Name: Date of Service: [name], PA UL D. 07/20/2020 9:30 A  M Medical Record Number: [id number] Patient Account Number: [id number] Date of Birth/Sex: Treating RN: Mar 23, 1951 (68 y.o. [name]) Carlene Coria Primary Care Provider: York Pellant [place] Other Clinician: Referring Provider: Treating Provider/Extender: Kris Hartmann, [place] Weeks in Treatment: 29 History of Present Illness Location: right foot Severity: Wag 3 Duration: #months Modifying Factors: Diabetic and charcot' foot.. Peripheral vascular disease ssociated Signs and Symptoms: osteo A HPI Description: see chief complaint. 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 This is been present for 5-6 years. He underwent a course of IV any biotics and hyperbaric oxygen roughly 5 years ago. He was offered an amputation by orthopedics and I believe at the time I concurred with this however the patient wishes to up go via wait-and-see approach. He is actually done fairly well over the 5 years is still ambulatory. He uses a silver alginate-based dressing on the foot which he changes himself up. 05/27/15; patient returns for his monthly visit the. The status of his foot and his wound are essentially unchanged this patient has a chronic idiopathic neuropathy. He has underlying osteomyelitis. He had extensive orthopedic surgery roughly 3 or 4 years ago had. He has underlying chronic osteomyelitis.[name] He dresses this with silver alginate. We order the supplies. I continue to see him on a monthly basis for wound debridement. 06/24/15 the patient arrives here today for his monthly wound care check. He has underlying chronic osteomyelitis. The wound was surgically debridement of nonviable circumferential tissue. He is continued with silver alginate based stressing severe this area changed daily by his wife. Post debridement this actually looks as good as I've seen this since quite some  time 07/29/15. The patient has been coming here monthly for many years. He has a chronic wound on his right lateral foot at roughly the mid shaft of his fifth metatarsal. Initially he underwent a code course of IV antibiotics  TASHAWN, LASWELL ([id number]) Visit Report for 07/20/2020 Debridement Details Patient Name: Date of Service: [name], [place] D. 07/20/2020 9:30 A M Medical Record Number: [id number] Patient Account Number: [id number] Date of Birth/Sex: Treating RN: 11-Dec-1950 (68 y.o. [name]) Carlene Coria Primary Care Provider: York Pellant [place] Other Clinician: Referring Provider: Treating Provider/Extender: Kris Hartmann, [place] Weeks in Treatment: 29 Debridement Performed for Assessment: Wound #3 Right,Plantar Calcaneus Performed By: Physician Ricard Dillon., MD Debridement Type: Debridement Severity of Tissue Pre Debridement: Fat layer exposed Level of Consciousness (Pre-procedure): Awake and Alert Pre-procedure Verification/Time Out Yes - 09:53 Taken: Start Time: 09:53 T Area Debrided (L x W): otal 1.2 (cm) x 0.3 (cm) = 0.36 (cm) Tissue and other material debrided: Viable, Non-Viable, Callus, Slough, Subcutaneous, Skin: Dermis , Skin: Epidermis, Slough Level: Skin/Subcutaneous Tissue Debridement Description: Excisional Instrument: Curette Bleeding: Moderate Hemostasis Achieved: Silver Nitrate End Time: 09:57 Procedural Pain: 2 Post Procedural Pain: 0 Response to Treatment: Procedure was tolerated well Level of Consciousness (Post- Awake and Alert procedure): Post Debridement Measurements of Total Wound Length: (cm) 1.2 Width: (cm) 0.3 Depth: (cm) 0.3 Volume: (cm) 0.085 Character of Wound/Ulcer Post Debridement: Improved Severity of Tissue Post Debridement: Fat layer exposed Post Procedure Diagnosis Same as Pre-procedure Electronic Signature(s) Signed: 07/20/2020 4:19:57 PM By: Linton Ham MD Signed: 07/20/2020 4:24:48 PM By: Carlene Coria RN Entered By: Linton Ham on 07/20/2020 10:32:45 -------------------------------------------------------------------------------- HPI Details Patient Name: Date of Service: [name], [place] UL D. 07/20/2020 9:30 A  M Medical Record Number: [id number] Patient Account Number: [id number] Date of Birth/Sex: Treating RN: Mar 23, 1951 (68 y.o. [name]) Carlene Coria Primary Care Provider: York Pellant [place] Other Clinician: Referring Provider: Treating Provider/Extender: Kris Hartmann, [place] Weeks in Treatment: 29 History of Present Illness Location: right foot Severity: Wag 3 Duration: #months Modifying Factors: Diabetic and charcot' foot.. Peripheral vascular disease ssociated Signs and Symptoms: osteo A HPI Description: see chief complaint. 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 This is been present for 5-6 years. He underwent a course of IV any biotics and hyperbaric oxygen roughly 5 years ago. He was offered an amputation by orthopedics and I believe at the time I concurred with this however the patient wishes to up go via wait-and-see approach. He is actually done fairly well over the 5 years is still ambulatory. He uses a silver alginate-based dressing on the foot which he changes himself up. 05/27/15; patient returns for his monthly visit the. The status of his foot and his wound are essentially unchanged this patient has a chronic idiopathic neuropathy. He has underlying osteomyelitis. He had extensive orthopedic surgery roughly 3 or 4 years ago had. He has underlying chronic osteomyelitis.[name] He dresses this with silver alginate. We order the supplies. I continue to see him on a monthly basis for wound debridement. 06/24/15 the patient arrives here today for his monthly wound care check. He has underlying chronic osteomyelitis. The wound was surgically debridement of nonviable circumferential tissue. He is continued with silver alginate based stressing severe this area changed daily by his wife. Post debridement this actually looks as good as I've seen this since quite some  time 07/29/15. The patient has been coming here monthly for many years. He has a chronic wound on his right lateral foot at roughly the mid shaft of his fifth metatarsal. Initially he underwent a code course of IV antibiotics  TASHAWN, LASWELL ([id number]) Visit Report for 07/20/2020 Debridement Details Patient Name: Date of Service: [name], [place] D. 07/20/2020 9:30 A M Medical Record Number: [id number] Patient Account Number: [id number] Date of Birth/Sex: Treating RN: 11-Dec-1950 (68 y.o. [name]) Carlene Coria Primary Care Provider: York Pellant [place] Other Clinician: Referring Provider: Treating Provider/Extender: Kris Hartmann, [place] Weeks in Treatment: 29 Debridement Performed for Assessment: Wound #3 Right,Plantar Calcaneus Performed By: Physician Ricard Dillon., MD Debridement Type: Debridement Severity of Tissue Pre Debridement: Fat layer exposed Level of Consciousness (Pre-procedure): Awake and Alert Pre-procedure Verification/Time Out Yes - 09:53 Taken: Start Time: 09:53 T Area Debrided (L x W): otal 1.2 (cm) x 0.3 (cm) = 0.36 (cm) Tissue and other material debrided: Viable, Non-Viable, Callus, Slough, Subcutaneous, Skin: Dermis , Skin: Epidermis, Slough Level: Skin/Subcutaneous Tissue Debridement Description: Excisional Instrument: Curette Bleeding: Moderate Hemostasis Achieved: Silver Nitrate End Time: 09:57 Procedural Pain: 2 Post Procedural Pain: 0 Response to Treatment: Procedure was tolerated well Level of Consciousness (Post- Awake and Alert procedure): Post Debridement Measurements of Total Wound Length: (cm) 1.2 Width: (cm) 0.3 Depth: (cm) 0.3 Volume: (cm) 0.085 Character of Wound/Ulcer Post Debridement: Improved Severity of Tissue Post Debridement: Fat layer exposed Post Procedure Diagnosis Same as Pre-procedure Electronic Signature(s) Signed: 07/20/2020 4:19:57 PM By: Linton Ham MD Signed: 07/20/2020 4:24:48 PM By: Carlene Coria RN Entered By: Linton Ham on 07/20/2020 10:32:45 -------------------------------------------------------------------------------- HPI Details Patient Name: Date of Service: [name], PA UL D. 07/20/2020 9:30 A  M Medical Record Number: [id number] Patient Account Number: [id number] Date of Birth/Sex: Treating RN: Mar 23, 1951 (68 y.o. [name]) Carlene Coria Primary Care Provider: York Pellant [place] Other Clinician: Referring Provider: Treating Provider/Extender: Kris Hartmann, [place] Weeks in Treatment: 29 History of Present Illness Location: right foot Severity: Wag 3 Duration: #months Modifying Factors: Diabetic and charcot' foot.. Peripheral vascular disease ssociated Signs and Symptoms: osteo A HPI Description: see chief complaint. 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 This is been present for 5-6 years. He underwent a course of IV any biotics and hyperbaric oxygen roughly 5 years ago. He was offered an amputation by orthopedics and I believe at the time I concurred with this however the patient wishes to up go via wait-and-see approach. He is actually done fairly well over the 5 years is still ambulatory. He uses a silver alginate-based dressing on the foot which he changes himself up. 05/27/15; patient returns for his monthly visit the. The status of his foot and his wound are essentially unchanged this patient has a chronic idiopathic neuropathy. He has underlying osteomyelitis. He had extensive orthopedic surgery roughly 3 or 4 years ago had. He has underlying chronic osteomyelitis.[name] He dresses this with silver alginate. We order the supplies. I continue to see him on a monthly basis for wound debridement. 06/24/15 the patient arrives here today for his monthly wound care check. He has underlying chronic osteomyelitis. The wound was surgically debridement of nonviable circumferential tissue. He is continued with silver alginate based stressing severe this area changed daily by his wife. Post debridement this actually looks as good as I've seen this since quite some  time 07/29/15. The patient has been coming here monthly for many years. He has a chronic wound on his right lateral foot at roughly the mid shaft of his fifth metatarsal. 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 07/20/2020 9:30 A M Medical Record Number: [id number] Patient Account Number: [id number] Date of Birth/Sex: Treating RN: 03-30-1951 (68 y.o. [name]) Carlene Coria Primary Care Provider: York Pellant [place] Other Clinician: Referring Provider: Treating Provider/Extender: Kris Hartmann, [place] Weeks in Treatment: 29 Subjective History of Present Illness (HPI) The following HPI elements were documented for the patient's wound: Location: right foot Severity: Wag 3 Duration: #months Modifying Factors: Diabetic and charcot' foot.. Peripheral vascular disease Associated Signs and  Symptoms: osteo see chief complaint. 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 This is been present for 5-6 years. He underwent a course of IV any biotics and hyperbaric oxygen roughly 5 years ago. He was offered an amputation by orthopedics and I believe at the time I concurred with this however the patient wishes to up go via wait-and-see approach. He is actually done fairly well over the 5 years is still ambulatory. He uses a silver alginate-based dressing on the foot which he changes himself up. 05/27/15; patient returns for his monthly visit the. The status of his foot and his wound are essentially unchanged this patient has a chronic idiopathic neuropathy. He has underlying osteomyelitis. He had extensive orthopedic surgery roughly 3 or 4 years ago had. He has underlying chronic osteomyelitis.[name] He dresses this with silver alginate. We order the supplies. I continue to see him on a monthly basis for wound debridement. 06/24/15 the patient arrives here today for his monthly wound care check. He has underlying chronic osteomyelitis. The wound was surgically debridement of nonviable circumferential tissue. He is continued with silver alginate based stressing severe this area changed daily by his wife. Post debridement this actually looks as good as I've seen this since quite some time 07/29/15. The patient has been coming here monthly for many years. He has a chronic wound on his right lateral foot at roughly the mid shaft of his fifth metatarsal. Initially he underwent a code course of IV antibiotics hyperbaric oxygen, resection I believe of his fifth and fourth metatarsal for chronic osteomyelitis. 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 He was offered an amputation at some point but refused. He comes here for "palliative" wound care which requires extensive debridement of necrotic  subcutaneous tissue to clean up the wound bed. He has been using silver alginate based dressings at home for many years. Although he has extensive edema of his foot, the situation has overall remained stable perhaps surprisingly so. 09/16/15; the patient has been coming here for many years. He is a chronic wound in his right lateral foot roughly the mid shaft of the fifth metatarsal. He is had previous surgery on this with resection. He clearly has underlying chronic osteomyelitis. An amputation was recommended years ago which she refused MRI to list the  would heal quicker although he is always refused to consider a total contact cast 4. Back in 1 month Electronic Signature(s) Signed: 07/20/2020 4:19:57 PM By: Linton Ham MD Entered By: Linton Ham on 07/20/2020 10:38:14 -------------------------------------------------------------------------------- SuperBill Details Patient Name: Date of Service: [name], [place] D. 07/20/2020 Medical Record Number: [id number] Patient Account Number: [id number] Date of Birth/Sex: Treating RN: 1951/05/18 (68 y.o. 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, right ankle and foot Facility Procedures CPT4 Code: [id number] Description: 01410 - DEB SUBQ TISSUE 20 SQ CM/< ICD-10 Diagnosis Description 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 of other part of right foot with fat layer exposed E11.621 Type 2 diabetes mellitus with foot ulcer Modifier: Quantity: 1 Physician Procedures : CPT4 Code Description Modifier [id number] - WC PHYS SUBQ TISS 20 SQ CM ICD-10 Diagnosis Description 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 of other part of right foot with fat layer exposed E11.621 Type 2 diabetes mellitus with foot  ulcer Quantity: 1 Electronic Signature(s) Signed: 07/20/2020 4:19:57 PM By: Linton Ham MD Entered By: Linton Ham on 07/20/2020 10:38:35  Clinician: Referring Provider: Treating Provider/Extender: Kris Hartmann, [place] Weeks in Treatment: 29 Active Problems ICD-10 Encounter Code Description Active Date MDM Diagnosis E11.621 Type 2 diabetes mellitus with foot ulcer 12/29/2019 No Yes 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 of other part of right foot with fat layer exposed 12/29/2019 No Yes E11.42 Type 2 diabetes mellitus with diabetic polyneuropathy 12/29/2019 No Yes M14.671 Charcot's joint, right ankle and foot 12/29/2019 No Yes Inactive Problems Resolved Problems Electronic Signature(s) Signed: 07/20/2020 4:19:57 PM By: Linton Ham MD Entered By: Linton Ham on 07/20/2020 10:32:24 -------------------------------------------------------------------------------- Progress Note Details Patient Name: Date of Service: [name], [place] UL D. 07/20/2020 9:30 A M Medical Record Number: [id number] Patient Account Number: [id number] Date of Birth/Sex: Treating RN: 03-30-1951 (68 y.o. [name]) Carlene Coria Primary Care Provider: York Pellant [place] Other Clinician: Referring Provider: Treating Provider/Extender: Kris Hartmann, [place] Weeks in Treatment: 29 Subjective History of Present Illness (HPI) The following HPI elements were documented for the patient's wound: Location: right foot Severity: Wag 3 Duration: #months Modifying Factors: Diabetic and charcot' foot.. Peripheral vascular disease Associated Signs and  Symptoms: osteo see chief complaint. 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 This is been present for 5-6 years. He underwent a course of IV any biotics and hyperbaric oxygen roughly 5 years ago. He was offered an amputation by orthopedics and I believe at the time I concurred with this however the patient wishes to up go via wait-and-see approach. He is actually done fairly well over the 5 years is still ambulatory. He uses a silver alginate-based dressing on the foot which he changes himself up. 05/27/15; patient returns for his monthly visit the. The status of his foot and his wound are essentially unchanged this patient has a chronic idiopathic neuropathy. He has underlying osteomyelitis. He had extensive orthopedic surgery roughly 3 or 4 years ago had. He has underlying chronic osteomyelitis.[name] He dresses this with silver alginate. We order the supplies. I continue to see him on a monthly basis for wound debridement. 06/24/15 the patient arrives here today for his monthly wound care check. He has underlying chronic osteomyelitis. The wound was surgically debridement of nonviable circumferential tissue. He is continued with silver alginate based stressing severe this area changed daily by his wife. Post debridement this actually looks as good as I've seen this since quite some time 07/29/15. The patient has been coming here monthly for many years. He has a chronic wound on his right lateral foot at roughly the mid shaft of his fifth metatarsal. Initially he underwent a code course of IV antibiotics hyperbaric oxygen, resection I believe of his fifth and fourth metatarsal for chronic osteomyelitis. 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 He was offered an amputation at some point but refused. He comes here for "palliative" wound care which requires extensive debridement of necrotic  subcutaneous tissue to clean up the wound bed. He has been using silver alginate based dressings at home for many years. Although he has extensive edema of his foot, the situation has overall remained stable perhaps surprisingly so. 09/16/15; the patient has been coming here for many years. He is a chronic wound in his right lateral foot roughly the mid shaft of the fifth metatarsal. He is had previous surgery on this with resection. He clearly has underlying chronic osteomyelitis. An amputation was recommended years ago which she refused MRI to list the

## 2020-07-21 DIAGNOSIS — E11621 Type 2 diabetes mellitus with foot ulcer: Secondary | ICD-10-CM | POA: Diagnosis not present

## 2020-07-22 NOTE — Progress Notes (Signed)
3:91.30%] [N/A:N/A] % Reduction in Volume: [3:12] Starting Position 1 (o'clock): [3:5] Ending Position 1 (o'clock): [3:0.3] Maximum Distance 1 (cm): [3:Yes] [N/A:N/A] Undermining: [3:Grade 2] [N/A:N/A] Classification: [3:Medium] [N/A:N/A] Exudate A mount: [3:Serosanguineous] [N/A:N/A] Exudate Type: [3:red, brown] [N/A:N/A] Exudate Color: [3:Thickened] [N/A:N/A] Wound Margin: [3:Large (67-100%)] [N/A:N/A] Granulation A mount: [3:Pink] [N/A:N/A] Granulation Quality: [3:None Present (0%)] [N/A:N/A] Necrotic A mount: [3:Fat Layer (Subcutaneous Tissue): Yes N/A] Exposed Structures: [3:Fascia: No Tendon: No Muscle: No Joint: No Bone: No Small (1-33%)] [N/A:N/A] Epithelialization: [3:Debridement - Excisional] [N/A:N/A] Debridement: Pre-procedure Verification/Time Out 09:53 [N/A:N/A] Taken: [3:Callus, Subcutaneous, Slough] [N/A:N/A] Tissue Debrided: [3:Skin/Subcutaneous Tissue] [N/A:N/A] Level: [3:0.36] [N/A:N/A] Debridement A (sq cm): [3:rea Curette] [N/A:N/A] Instrument: [3:Moderate] [N/A:N/A] Bleeding: [3:Silver Nitrate] [N/A:N/A] Hemostasis A chieved: [3:2] [N/A:N/A] Procedural Pain: [3:0] [N/A:N/A] Post Procedural Pain:  [3:Procedure was tolerated well] [N/A:N/A] Debridement Treatment Response: [3:1.2x0.3x0.3] [N/A:N/A] Post Debridement Measurements L x W x D (cm) [3:0.085] [N/A:N/A] Post Debridement Volume: (cm) [3:Debridement] [N/A:N/A] Treatment Notes Wound #3 (Right, Plantar Calcaneus) 1. Cleanse With Wound Cleanser 3. Primary Dressing Applied Calcium Alginate Ag 4. Secondary Dressing Dry Gauze Roll Gauze Foam 5. Secured With Tape Notes netting. foam donut Electronic Signature(s) Signed: 07/20/2020 4:19:57 PM By: Linton Ham MD Signed: 07/20/2020 4:24:48 PM By: Carlene Coria RN Entered By: Linton Ham on 07/20/2020 10:32:31 -------------------------------------------------------------------------------- Multi-Disciplinary Care Plan Details Patient Name: Date of Service: Nathan Miller, [place] UL D. 07/20/2020 9:30 A M Medical Record Number: [id number] Patient Account Number: [id number] Date of Birth/Sex: Treating RN: 03/21/1951 (69 y.o. [name]) Carlene Coria Primary Care Savannah Erbe: York Pellant [place] Other Clinician: Referring Tyresse Jayson: Treating Kambrie Eddleman/Extender: Kris Hartmann, [place] Weeks in Treatment: 29 Active Inactive Wound/Skin Impairment Nursing Diagnoses: Impaired tissue integrity Knowledge deficit related to ulceration/compromised skin integrity Goals: Patient/caregiver will verbalize understanding of skin care regimen Date Initiated: 12/29/2019 Target Resolution Date: 07/26/2020 Goal Status: Active Ulcer/skin breakdown will have a volume reduction of 30% by week 4 Date Initiated: 12/29/2019 Date Inactivated: 02/09/2020 Target Resolution Date: 01/30/2020 Goal Status: Met Interventions: Assess patient/caregiver ability to obtain necessary supplies Assess patient/caregiver ability to perform ulcer/skin care regimen upon admission and as needed Assess ulceration(s) every visit Provide education on ulcer and skin care Notes: Electronic Signature(s) Signed:  07/20/2020 4:24:48 PM By: Carlene Coria RN Entered By: Carlene Coria on 07/20/2020 09:01:30 -------------------------------------------------------------------------------- Pain Assessment Details Patient Name: Date of Service: Nathan Moll, [place] UL D. 07/20/2020 9:30 A M Medical Record Number: [id number] Patient Account Number: [id number] Date of Birth/Sex: Treating RN: [birth year] (69 y.o. [name]) Carlene Coria Primary Care Trindon Dorton: York Pellant [place] Other Clinician: Referring Fabio Wah: Treating Rim Thatch/Extender: Kris Hartmann, [place] Weeks in Treatment: 29 Active Problems Location of Pain Severity and Description of Pain Patient Has Paino No Site Locations Pain Management and Medication Current Pain Management: Electronic Signature(s) Signed: 07/20/2020 4:24:48 PM By: Carlene Coria RN Signed: 07/22/2020 8:15:52 AM By: Sandre Kitty Entered By: Sandre Kitty on 07/20/2020 09:33:55 -------------------------------------------------------------------------------- Patient/Caregiver Education Details Patient Name: Date of Service: Nathan Moll, [place] UL D. 9/21/2021andnbsp9:30 A M Medical Record Number: [id number] Patient Account Number: [id number] Date of Birth/Gender: Treating RN: July 05, 1951 (69 y.o. [name]) Carlene Coria Primary Care Physician: York Pellant [place] Other Clinician: Referring Physician: Treating Physician/Extender: Kris Hartmann, [place] Weeks in Treatment: 29 Education Assessment Education Provided To: Patient Education Topics Provided Wound/Skin Impairment: Methods: Explain/Verbal Responses: State content correctly Electronic Signature(s) Signed: 07/20/2020 4:24:48 PM By: Carlene Coria RN Entered By: Carlene Coria on 07/20/2020 09:01:56 -------------------------------------------------------------------------------- Wound Assessment Details Patient Name:  3:91.30%] [N/A:N/A] % Reduction in Volume: [3:12] Starting Position 1 (o'clock): [3:5] Ending Position 1 (o'clock): [3:0.3] Maximum Distance 1 (cm): [3:Yes] [N/A:N/A] Undermining: [3:Grade 2] [N/A:N/A] Classification: [3:Medium] [N/A:N/A] Exudate A mount: [3:Serosanguineous] [N/A:N/A] Exudate Type: [3:red, brown] [N/A:N/A] Exudate Color: [3:Thickened] [N/A:N/A] Wound Margin: [3:Large (67-100%)] [N/A:N/A] Granulation A mount: [3:Pink] [N/A:N/A] Granulation Quality: [3:None Present (0%)] [N/A:N/A] Necrotic A mount: [3:Fat Layer (Subcutaneous Tissue): Yes N/A] Exposed Structures: [3:Fascia: No Tendon: No Muscle: No Joint: No Bone: No Small (1-33%)] [N/A:N/A] Epithelialization: [3:Debridement - Excisional] [N/A:N/A] Debridement: Pre-procedure Verification/Time Out 09:53 [N/A:N/A] Taken: [3:Callus, Subcutaneous, Slough] [N/A:N/A] Tissue Debrided: [3:Skin/Subcutaneous Tissue] [N/A:N/A] Level: [3:0.36] [N/A:N/A] Debridement A (sq cm): [3:rea Curette] [N/A:N/A] Instrument: [3:Moderate] [N/A:N/A] Bleeding: [3:Silver Nitrate] [N/A:N/A] Hemostasis A chieved: [3:2] [N/A:N/A] Procedural Pain: [3:0] [N/A:N/A] Post Procedural Pain:  [3:Procedure was tolerated well] [N/A:N/A] Debridement Treatment Response: [3:1.2x0.3x0.3] [N/A:N/A] Post Debridement Measurements L x W x D (cm) [3:0.085] [N/A:N/A] Post Debridement Volume: (cm) [3:Debridement] [N/A:N/A] Treatment Notes Wound #3 (Right, Plantar Calcaneus) 1. Cleanse With Wound Cleanser 3. Primary Dressing Applied Calcium Alginate Ag 4. Secondary Dressing Dry Gauze Roll Gauze Foam 5. Secured With Tape Notes netting. foam donut Electronic Signature(s) Signed: 07/20/2020 4:19:57 PM By: Linton Ham MD Signed: 07/20/2020 4:24:48 PM By: Carlene Coria RN Entered By: Linton Ham on 07/20/2020 10:32:31 -------------------------------------------------------------------------------- Multi-Disciplinary Care Plan Details Patient Name: Date of Service: Nathan Miller, [place] UL D. 07/20/2020 9:30 A M Medical Record Number: [id number] Patient Account Number: [id number] Date of Birth/Sex: Treating RN: 03/21/1951 (69 y.o. [name]) Carlene Coria Primary Care Savannah Erbe: York Pellant [place] Other Clinician: Referring Tyresse Jayson: Treating Kambrie Eddleman/Extender: Kris Hartmann, [place] Weeks in Treatment: 29 Active Inactive Wound/Skin Impairment Nursing Diagnoses: Impaired tissue integrity Knowledge deficit related to ulceration/compromised skin integrity Goals: Patient/caregiver will verbalize understanding of skin care regimen Date Initiated: 12/29/2019 Target Resolution Date: 07/26/2020 Goal Status: Active Ulcer/skin breakdown will have a volume reduction of 30% by week 4 Date Initiated: 12/29/2019 Date Inactivated: 02/09/2020 Target Resolution Date: 01/30/2020 Goal Status: Met Interventions: Assess patient/caregiver ability to obtain necessary supplies Assess patient/caregiver ability to perform ulcer/skin care regimen upon admission and as needed Assess ulceration(s) every visit Provide education on ulcer and skin care Notes: Electronic Signature(s) Signed:  07/20/2020 4:24:48 PM By: Carlene Coria RN Entered By: Carlene Coria on 07/20/2020 09:01:30 -------------------------------------------------------------------------------- Pain Assessment Details Patient Name: Date of Service: Nathan Moll, [place] UL D. 07/20/2020 9:30 A M Medical Record Number: [id number] Patient Account Number: [id number] Date of Birth/Sex: Treating RN: [birth year] (69 y.o. [name]) Carlene Coria Primary Care Trindon Dorton: York Pellant [place] Other Clinician: Referring Fabio Wah: Treating Rim Thatch/Extender: Kris Hartmann, [place] Weeks in Treatment: 29 Active Problems Location of Pain Severity and Description of Pain Patient Has Paino No Site Locations Pain Management and Medication Current Pain Management: Electronic Signature(s) Signed: 07/20/2020 4:24:48 PM By: Carlene Coria RN Signed: 07/22/2020 8:15:52 AM By: Sandre Kitty Entered By: Sandre Kitty on 07/20/2020 09:33:55 -------------------------------------------------------------------------------- Patient/Caregiver Education Details Patient Name: Date of Service: Nathan Moll, [place] UL D. 9/21/2021andnbsp9:30 A M Medical Record Number: [id number] Patient Account Number: [id number] Date of Birth/Gender: Treating RN: July 05, 1951 (69 y.o. [name]) Carlene Coria Primary Care Physician: York Pellant [place] Other Clinician: Referring Physician: Treating Physician/Extender: Kris Hartmann, [place] Weeks in Treatment: 29 Education Assessment Education Provided To: Patient Education Topics Provided Wound/Skin Impairment: Methods: Explain/Verbal Responses: State content correctly Electronic Signature(s) Signed: 07/20/2020 4:24:48 PM By: Carlene Coria RN Entered By: Carlene Coria on 07/20/2020 09:01:56 -------------------------------------------------------------------------------- Wound Assessment Details Patient Name:  Date of Service: Nathan Miller, [place] D. 07/20/2020 9:30 A M Medical Record  Number: [id number] Patient Account Number: [id number] Date of Birth/Sex: Treating RN: 1951/05/20 (68 y.o. [name]) Carlene Coria Primary Care Breea Loncar: Sharmaine Base, [place] Other Clinician: Referring Catera Hankins: Treating Jaymari Cromie/Extender: Kris Hartmann, [place] Weeks in Treatment: 29 Wound Status Wound Number: 3 Primary Diabetic Wound/Ulcer of the Lower Extremity Etiology: Wound Location: Right, Plantar Calcaneus Wound Open Wounding Event: Blister Status: Date Acquired: 09/30/2019 Comorbid Coronary Artery Disease, Hypertension, Type II Diabetes, Weeks Of Treatment: 29 History: Osteoarthritis, Osteomyelitis, Neuropathy, Confinement Anxiety Clustered Wound: No Photos Photo Uploaded By: [name] on 07/21/2020 11:25:18 Wound Measurements Length: (cm) 1.2 Width: (cm) 0.3 Depth: (cm) 0.3 Area: (cm) 0.283 Volume: (cm) 0.085 % Reduction in Area: 82.7% % Reduction in Volume: 91.3% Epithelialization: Small (1-33%) Tunneling: No Undermining: Yes Starting Position (o'clock): 12 Ending Position (o'clock): 5 Maximum Distance: (cm) 0.3 Wound Description Classification: Grade 2 Wound Margin: Thickened Exudate Amount: Medium Exudate Type: Serosanguineous Exudate Color: red, brown Foul Odor After Cleansing: No Slough/Fibrino No Wound Bed Granulation Amount: Large (67-100%) Exposed Structure Granulation Quality: Pink Fascia Exposed: No Necrotic Amount: None Present (0%) Fat Layer (Subcutaneous Tissue) Exposed: Yes Tendon Exposed: No Muscle Exposed: No Joint Exposed: No Bone Exposed: No Treatment Notes Wound #3 (Right, Plantar Calcaneus) 1. Cleanse With Wound Cleanser 3. Primary Dressing Applied Calcium Alginate Ag 4. Secondary Dressing Dry Gauze Roll Gauze Foam 5. Secured With Tape Notes netting. foam donut Electronic Signature(s) Signed: 07/20/2020 4:24:48 PM By: Carlene Coria RN Signed: 07/21/2020 4:54:19 PM By: Levan Hurst RN,  BSN Entered By: Levan Hurst on 07/20/2020 09:41:17 -------------------------------------------------------------------------------- [place] Details Patient Name: Date of Service: Nathan Moll, [place] UL D. 07/20/2020 9:30 A M Medical Record Number: [id number] Patient Account Number: [id number] Date of Birth/Sex: Treating RN: 04/05/1951 (68 y.o. [name]) Carlene Coria Primary Care Layden Caterino: Sharmaine Base, [place] Other Clinician: Referring Mieka Leaton: Treating Analeya Luallen/Extender: Kris Hartmann, [place] Weeks in Treatment: 29 Vital Signs Time Taken: 09:30 Temperature (F): 98.1 Height (in): 68 Pulse (bpm): 65 Weight (lbs): 253 Respiratory Rate (breaths/min): 18 Body Mass Index (BMI): 38.5 Blood Pressure (mmHg): 175/89 Reference Range: 80 - 120 mg / dl Electronic Signature(s) Signed: 07/22/2020 8:15:52 AM By: Sandre Kitty Entered By: Sandre Kitty on 07/20/2020 09:33:48

## 2020-07-29 DIAGNOSIS — M79676 Pain in unspecified toe(s): Secondary | ICD-10-CM | POA: Diagnosis not present

## 2020-07-29 DIAGNOSIS — B351 Tinea unguium: Secondary | ICD-10-CM | POA: Diagnosis not present

## 2020-08-17 ENCOUNTER — Encounter (HOSPITAL_BASED_OUTPATIENT_CLINIC_OR_DEPARTMENT_OTHER): Payer: PPO | Admitting: Internal Medicine

## 2020-08-31 ENCOUNTER — Encounter (HOSPITAL_BASED_OUTPATIENT_CLINIC_OR_DEPARTMENT_OTHER): Payer: PPO | Attending: Internal Medicine | Admitting: Internal Medicine

## 2020-08-31 ENCOUNTER — Other Ambulatory Visit: Payer: Self-pay

## 2020-08-31 DIAGNOSIS — E11621 Type 2 diabetes mellitus with foot ulcer: Secondary | ICD-10-CM | POA: Insufficient documentation

## 2020-08-31 DIAGNOSIS — Z87891 Personal history of nicotine dependence: Secondary | ICD-10-CM | POA: Insufficient documentation

## 2020-08-31 DIAGNOSIS — M14671 Charcot's joint, right ankle and foot: Secondary | ICD-10-CM | POA: Insufficient documentation

## 2020-08-31 DIAGNOSIS — Z950 Presence of cardiac pacemaker: Secondary | ICD-10-CM | POA: Diagnosis not present

## 2020-08-31 DIAGNOSIS — E1151 Type 2 diabetes mellitus with diabetic peripheral angiopathy without gangrene: Secondary | ICD-10-CM | POA: Diagnosis not present

## 2020-08-31 DIAGNOSIS — E1142 Type 2 diabetes mellitus with diabetic polyneuropathy: Secondary | ICD-10-CM | POA: Diagnosis not present

## 2020-08-31 DIAGNOSIS — Z955 Presence of coronary angioplasty implant and graft: Secondary | ICD-10-CM | POA: Diagnosis not present

## 2020-08-31 DIAGNOSIS — L97412 Non-pressure chronic ulcer of right heel and midfoot with fat layer exposed: Secondary | ICD-10-CM | POA: Diagnosis not present

## 2020-08-31 DIAGNOSIS — L97512 Non-pressure chronic ulcer of other part of right foot with fat layer exposed: Secondary | ICD-10-CM | POA: Insufficient documentation

## 2020-09-01 NOTE — Progress Notes (Signed)
M Medical Record Number: [id number] Patient Account Number: [id number] Date of Birth/Sex: Treating RN: 1951-03-22 (69 y.o. Nathan Miller) Carlene Coria Primary Care Provider: Sharmaine Base, [place] Other Clinician: Referring Provider: Treating Provider/Extender: Kris Hartmann, [place] Weeks in Treatment: 35 Active Problems ICD-10 Encounter Code Description Active Date MDM Diagnosis E11.621 Type 2 diabetes mellitus with foot ulcer 12/29/2019 No Yes 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 of other part of right foot with fat layer exposed 12/29/2019 No Yes E11.42 Type 2 diabetes mellitus with diabetic polyneuropathy 12/29/2019 No Yes M14.671 Charcot's joint, right ankle and foot 12/29/2019 No Yes Inactive Problems Resolved Problems Electronic Signature(s) Signed: 08/31/2020 4:57:00 PM By: Linton Ham MD Entered By: Linton Ham on 08/31/2020 10:19:51 -------------------------------------------------------------------------------- Progress Note Details Patient Name: Date of Service: Nathan Moll, PA UL D. 08/31/2020 9:00 A M Medical Record Number: [id number] Patient Account Number: [id number] Date of Birth/Sex: Treating RN: Mar 28, 1951 (69 y.o. Nathan Miller) Carlene Coria Primary Care Provider: York Pellant [place] Other Clinician: Referring Provider: Treating Provider/Extender: Kris Hartmann, [place] Weeks in Treatment: 35 Subjective History of Present Illness (HPI) The following HPI elements were documented for the patient's wound: Location: right foot Severity: Wag  3 Duration: #months Modifying Factors: Diabetic and charcot' foot.. Peripheral vascular disease Associated Signs and Symptoms: osteo see chief complaint. 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 This is been present for 5-6 years. He underwent a course of IV any biotics and hyperbaric oxygen roughly 5 years ago. He was offered an amputation by orthopedics and I believe at the time I concurred with this however the patient wishes to up go via wait-and-see approach. He is actually done fairly well over the 5 years is still ambulatory. He uses a silver alginate-based dressing on the foot which he changes himself up. 05/27/15; patient returns for his monthly visit the. The status of his foot and his wound are essentially unchanged this patient has a chronic idiopathic neuropathy. He has underlying osteomyelitis. He had extensive orthopedic surgery roughly 3 or 4 years ago had. He has underlying chronic osteomyelitis.[name] He dresses this with silver alginate. We order the supplies. I continue to see him on a monthly basis for wound debridement. 06/24/15 the patient arrives here today for his monthly wound care check. He has underlying chronic osteomyelitis. The wound was surgically debridement of nonviable circumferential tissue. He is continued with silver alginate based stressing severe this area changed daily by his wife. Post debridement this actually looks as good as I've seen this since quite some time 07/29/15. The patient has been coming here monthly for many years. He has a chronic wound on his right lateral foot at roughly the mid shaft of his fifth metatarsal. Initially he underwent a code course of IV antibiotics hyperbaric oxygen, resection I believe of his fifth and fourth metatarsal for chronic osteomyelitis. 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 He was offered an amputation at  some point but refused. He comes here for "palliative" wound care which requires extensive debridement of necrotic subcutaneous tissue to clean up the wound bed. He has been using silver alginate based dressings at home for many years. Although he has extensive edema of his foot, the situation has overall remained stable perhaps surprisingly so. 09/16/15; the patient has been coming here for many years. He is a chronic wound in his right lateral foot roughly the mid shaft of the fifth metatarsal.  foot and lymphedema from previous foot surgery. He has a wound on the plantar aspect of his heel laterally. I think this is a really an offloading issue more than anything else. Nevertheless the last time he was here he had some purulent drainage there was a culture done which showed a Citrobacter species. This was missed in our clinic and only came to my attention on 9/13. I sent in a prescription for ciprofloxacin to his [place] however for some reason [facility] is backordered on ciprofloxacin. Therefore he is never received it. He didn't call us nor did [facility] call us or fax [place] for that matter. He is using silver alginate to the wound which I think is better in terms of surface area 11/2; wound on the right lateral calcaneus in the setting of chronic Charcot foot and lymphedema from previous foot surgery. His wound is on the lateral part of his left heel Electronic Signature(s) Signed: 08/31/2020 4:57:00 PM By: Linton Ham MD Entered By: Linton Ham on 08/31/2020 10:22:31 -------------------------------------------------------------------------------- Physical Exam Details Patient Name: Date of Service: Nathan Moll, PA UL D. 08/31/2020 9:00 A M Medical Record Number: [id number] Patient Account Number: [id number] Date of Birth/Sex: Treating RN: 07-04-1951 (69 y.o. Nathan Miller) Carlene Coria Primary Care Provider: York Pellant [place] Other Clinician: Referring Provider: Treating Provider/Extender: Linton Ham [place], [place] Weeks in Treatment: 17 Constitutional Patient is hypertensive.. Pulse regular and within target range for patient.[name] Respirations regular, non-labored and within target range.. Temperature is normal and within the target range for the patient.[name] Appears in no  distress. Notes Wound exam; the patient had the lateral part of the wound covered and overhanging skin and subcutaneous tissue I remove this but a lot of the wound has epithelialized which is nice to see. I used a #5 curette to remove overhanging skin and subcutaneous tissue. I am hopeful that this will not be a recurrent issue. There is no surrounding erythema. The wound does not probe deeply Electronic Signature(s) Signed: 08/31/2020 4:57:00 PM By: Linton Ham MD Entered By: Linton Ham on 08/31/2020 10:25:40 -------------------------------------------------------------------------------- Physician Orders Details Patient Name: Date of Service: Nathan Miller, [place] UL D. 08/31/2020 9:00 A M Medical Record Number: [id number] Patient Account Number: [id number] Date of Birth/Sex: Treating RN: 08/31/1951 (69 y.o. 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 Dressing Change Frequency Wound #3 Right,Plantar Calcaneus Change dressing every day. Wound Cleansing Wound #3 Right,Plantar Calcaneus May shower and wash wound with soap and water. - on days that dressing is changed Primary Wound Dressing Wound #3 Right,Plantar Calcaneus Calcium Alginate with Silver Secondary Dressing Wound #3 Right,Plantar Calcaneus Foam - foam donut Kerlix/Rolled Gauze - secure with tape Dry Gauze Other: - ace wrap over dressing Off-Loading Turn and reposition every 2 hours Other: - float  heels off of bed/chair with pillow under calves Electronic Signature(s) Signed: 08/31/2020 4:57:00 PM By: Linton Ham MD Signed: 09/01/2020 9:08:43 AM By: Carlene Coria RN Entered By: Carlene Coria on 08/31/2020 09:26:00 -------------------------------------------------------------------------------- Problem List Details Patient Name: Date of Service: Nathan Moll, PA UL D. 08/31/2020 9:00 A  on the lateral part of the wound. Clean surface require debridement. 2. Still using silver alginate on this area there has been progress 3. I think the only issue here is an offloading 1 but there is very little we can do to offload this further. He has an AFO brace and a custom made shoe on this side Electronic Signature(s) Signed: 08/31/2020 4:57:00 PM By: Linton Ham MD Entered By: Linton Ham on 08/31/2020 10:26:40 -------------------------------------------------------------------------------- SuperBill Details Patient Name: Date of Service: Nathan Miller, [place] UL D. 08/31/2020 Medical Record Number: [id number] Patient Account Number: [id number] Date of Birth/Sex: Treating RN: April 30, 1951 (69 y.o. 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, right ankle and foot Facility Procedures CPT4 Code: 76811572 Description: 62035 - DEB SUBQ TISSUE 20 SQ CM/< ICD-10 Diagnosis Description 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 of other part of right foot with fat layer exposed Modifier: Quantity: 1 Physician Procedures : CPT4 Code Description Modifier [id number] 84536 - WC PHYS SUBQ TISS 20 SQ CM ICD-10 Diagnosis Description  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 of other part of right foot with fat layer exposed Quantity: 1 Electronic Signature(s) Signed: 08/31/2020 4:57:00 PM By: Linton Ham MD Entered By: Linton Ham on 08/31/2020 10:26:55  on the lateral part of the wound. Clean surface require debridement. 2. Still using silver alginate on this area there has been progress 3. I think the only issue here is an offloading 1 but there is very little we can do to offload this further. He has an AFO brace and a custom made shoe on this side Electronic Signature(s) Signed: 08/31/2020 4:57:00 PM By: Linton Ham MD Entered By: Linton Ham on 08/31/2020 10:26:40 -------------------------------------------------------------------------------- SuperBill Details Patient Name: Date of Service: Nathan Miller, [place] UL D. 08/31/2020 Medical Record Number: [id number] Patient Account Number: [id number] Date of Birth/Sex: Treating RN: April 30, 1951 (69 y.o. 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, right ankle and foot Facility Procedures CPT4 Code: 76811572 Description: 62035 - DEB SUBQ TISSUE 20 SQ CM/< ICD-10 Diagnosis Description 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 of other part of right foot with fat layer exposed Modifier: Quantity: 1 Physician Procedures : CPT4 Code Description Modifier [id number] 84536 - WC PHYS SUBQ TISS 20 SQ CM ICD-10 Diagnosis Description  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 of other part of right foot with fat layer exposed Quantity: 1 Electronic Signature(s) Signed: 08/31/2020 4:57:00 PM By: Linton Ham MD Entered By: Linton Ham on 08/31/2020 10:26:55  foot and lymphedema from previous foot surgery. He has a wound on the plantar aspect of his heel laterally. I think this is a really an offloading issue more than anything else. Nevertheless the last time he was here he had some purulent drainage there was a culture done which showed a Citrobacter species. This was missed in our clinic and only came to my attention on 9/13. I sent in a prescription for ciprofloxacin to his [place] however for some reason [facility] is backordered on ciprofloxacin. Therefore he is never received it. He didn't call us nor did [facility] call us or fax [place] for that matter. He is using silver alginate to the wound which I think is better in terms of surface area 11/2; wound on the right lateral calcaneus in the setting of chronic Charcot foot and lymphedema from previous foot surgery. His wound is on the lateral part of his left heel Electronic Signature(s) Signed: 08/31/2020 4:57:00 PM By: Linton Ham MD Entered By: Linton Ham on 08/31/2020 10:22:31 -------------------------------------------------------------------------------- Physical Exam Details Patient Name: Date of Service: Nathan Moll, PA UL D. 08/31/2020 9:00 A M Medical Record Number: [id number] Patient Account Number: [id number] Date of Birth/Sex: Treating RN: 07-04-1951 (69 y.o. Nathan Miller) Carlene Coria Primary Care Provider: York Pellant [place] Other Clinician: Referring Provider: Treating Provider/Extender: Linton Ham [place], [place] Weeks in Treatment: 17 Constitutional Patient is hypertensive.. Pulse regular and within target range for patient.[name] Respirations regular, non-labored and within target range.. Temperature is normal and within the target range for the patient.[name] Appears in no  distress. Notes Wound exam; the patient had the lateral part of the wound covered and overhanging skin and subcutaneous tissue I remove this but a lot of the wound has epithelialized which is nice to see. I used a #5 curette to remove overhanging skin and subcutaneous tissue. I am hopeful that this will not be a recurrent issue. There is no surrounding erythema. The wound does not probe deeply Electronic Signature(s) Signed: 08/31/2020 4:57:00 PM By: Linton Ham MD Entered By: Linton Ham on 08/31/2020 10:25:40 -------------------------------------------------------------------------------- Physician Orders Details Patient Name: Date of Service: Nathan Miller, [place] UL D. 08/31/2020 9:00 A M Medical Record Number: [id number] Patient Account Number: [id number] Date of Birth/Sex: Treating RN: 08/31/1951 (69 y.o. 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 Dressing Change Frequency Wound #3 Right,Plantar Calcaneus Change dressing every day. Wound Cleansing Wound #3 Right,Plantar Calcaneus May shower and wash wound with soap and water. - on days that dressing is changed Primary Wound Dressing Wound #3 Right,Plantar Calcaneus Calcium Alginate with Silver Secondary Dressing Wound #3 Right,Plantar Calcaneus Foam - foam donut Kerlix/Rolled Gauze - secure with tape Dry Gauze Other: - ace wrap over dressing Off-Loading Turn and reposition every 2 hours Other: - float  heels off of bed/chair with pillow under calves Electronic Signature(s) Signed: 08/31/2020 4:57:00 PM By: Linton Ham MD Signed: 09/01/2020 9:08:43 AM By: Carlene Coria RN Entered By: Carlene Coria on 08/31/2020 09:26:00 -------------------------------------------------------------------------------- Problem List Details Patient Name: Date of Service: Nathan Moll, PA UL D. 08/31/2020 9:00 A  Nathan Miller, Nathan Miller ([id number]) Visit Report for 08/31/2020 Debridement Details Patient Name: Date of Service: Nathan Miller, [place] UL D. 08/31/2020 9:00 A M Medical Record Number: [id number] Patient Account Number: [id number] Date of Birth/Sex: Treating RN: 16-Jul-1951 (69 y.o. Nathan Miller) Carlene Coria Primary Care Provider: York Pellant [place] Other Clinician: Referring Provider: Treating Provider/Extender: Kris Hartmann, [place] Weeks in Treatment: 35 Debridement Performed for Assessment: Wound #3 Right,Plantar Calcaneus Performed By: Physician Ricard Dillon., MD Debridement Type: Debridement Severity of Tissue Pre Debridement: Fat layer exposed Level of Consciousness (Pre-procedure): Awake and Alert Pre-procedure Verification/Time Out Yes - 09:44 Taken: Start Time: 09:44 Pain Control: Lidocaine 5% topical ointment T Area Debrided (L x W): otal 1 (cm) x 0.4 (cm) = 0.4 (cm) Tissue and other material debrided: Viable, Non-Viable, Callus, Slough, Subcutaneous, Skin: Dermis , Skin: Epidermis, Slough Level: Skin/Subcutaneous Tissue Debridement Description: Excisional Instrument: Curette Bleeding: Moderate Hemostasis Achieved: Pressure End Time: 09:46 Procedural Pain: 0 Post Procedural Pain: 0 Response to Treatment: Procedure was tolerated well Level of Consciousness (Post- Awake and Alert procedure): Post Debridement Measurements of Total Wound Length: (cm) 1 Width: (cm) 0.4 Depth: (cm) 0.7 Volume: (cm) 0.22 Character of Wound/Ulcer Post Debridement: Improved Severity of Tissue Post Debridement: Fat layer exposed Post Procedure Diagnosis Same as Pre-procedure Electronic Signature(s) Signed: 08/31/2020 4:57:00 PM By: Linton Ham MD Signed: 09/01/2020 9:08:43 AM By: Carlene Coria RN Entered By: Linton Ham on 08/31/2020 10:21:12 -------------------------------------------------------------------------------- HPI Details Patient Name: Date of Service: Nathan Moll,  [place] UL D. 08/31/2020 9:00 A M Medical Record Number: [id number] Patient Account Number: [id number] Date of Birth/Sex: Treating RN: Nov 04, 1950 (69 y.o. Nathan Miller) Carlene Coria Primary Care Provider: York Pellant [place] Other Clinician: Referring Provider: Treating Provider/Extender: Kris Hartmann, [place] Weeks in Treatment: 35 History of Present Illness Location: right foot Severity: Wag 3 Duration: #months Modifying Factors: Diabetic and charcot' foot.. Peripheral vascular disease ssociated Signs and Symptoms: osteo A HPI Description: see chief complaint. 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 This is been present for 5-6 years. He underwent a course of IV any biotics and hyperbaric oxygen roughly 5 years ago. He was offered an amputation by orthopedics and I believe at the time I concurred with this however the patient wishes to up go via wait-and-see approach. He is actually done fairly well over the 5 years is still ambulatory. He uses a silver alginate-based dressing on the foot which he changes himself up. 05/27/15; patient returns for his monthly visit the. The status of his foot and his wound are essentially unchanged this patient has a chronic idiopathic neuropathy. He has underlying osteomyelitis. He had extensive orthopedic surgery roughly 3 or 4 years ago had. He has underlying chronic osteomyelitis.[name] He dresses this with silver alginate. We order the supplies. I continue to see him on a monthly basis for wound debridement. 06/24/15 the patient arrives here today for his monthly wound care check. He has underlying chronic osteomyelitis. The wound was surgically debridement of nonviable circumferential tissue. He is continued with silver alginate based stressing severe this area changed daily by his wife. Post debridement this actually looks as good as I've seen  this since quite some time 07/29/15. The patient has been coming here monthly for many years. He has a chronic wound on his right lateral foot at roughly the mid shaft of his fifth metatarsal. Initially he underwent a  Nathan Miller, Nathan Miller ([id number]) Visit Report for 08/31/2020 Debridement Details Patient Name: Date of Service: Nathan Miller, [place] UL D. 08/31/2020 9:00 A M Medical Record Number: [id number] Patient Account Number: [id number] Date of Birth/Sex: Treating RN: 16-Jul-1951 (69 y.o. Nathan Miller) Carlene Coria Primary Care Provider: York Pellant [place] Other Clinician: Referring Provider: Treating Provider/Extender: Kris Hartmann, [place] Weeks in Treatment: 35 Debridement Performed for Assessment: Wound #3 Right,Plantar Calcaneus Performed By: Physician Ricard Dillon., MD Debridement Type: Debridement Severity of Tissue Pre Debridement: Fat layer exposed Level of Consciousness (Pre-procedure): Awake and Alert Pre-procedure Verification/Time Out Yes - 09:44 Taken: Start Time: 09:44 Pain Control: Lidocaine 5% topical ointment T Area Debrided (L x W): otal 1 (cm) x 0.4 (cm) = 0.4 (cm) Tissue and other material debrided: Viable, Non-Viable, Callus, Slough, Subcutaneous, Skin: Dermis , Skin: Epidermis, Slough Level: Skin/Subcutaneous Tissue Debridement Description: Excisional Instrument: Curette Bleeding: Moderate Hemostasis Achieved: Pressure End Time: 09:46 Procedural Pain: 0 Post Procedural Pain: 0 Response to Treatment: Procedure was tolerated well Level of Consciousness (Post- Awake and Alert procedure): Post Debridement Measurements of Total Wound Length: (cm) 1 Width: (cm) 0.4 Depth: (cm) 0.7 Volume: (cm) 0.22 Character of Wound/Ulcer Post Debridement: Improved Severity of Tissue Post Debridement: Fat layer exposed Post Procedure Diagnosis Same as Pre-procedure Electronic Signature(s) Signed: 08/31/2020 4:57:00 PM By: Linton Ham MD Signed: 09/01/2020 9:08:43 AM By: Carlene Coria RN Entered By: Linton Ham on 08/31/2020 10:21:12 -------------------------------------------------------------------------------- HPI Details Patient Name: Date of Service: Nathan Moll,  [place] UL D. 08/31/2020 9:00 A M Medical Record Number: [id number] Patient Account Number: [id number] Date of Birth/Sex: Treating RN: Nov 04, 1950 (69 y.o. Nathan Miller) Carlene Coria Primary Care Provider: York Pellant [place] Other Clinician: Referring Provider: Treating Provider/Extender: Kris Hartmann, [place] Weeks in Treatment: 35 History of Present Illness Location: right foot Severity: Wag 3 Duration: #months Modifying Factors: Diabetic and charcot' foot.. Peripheral vascular disease ssociated Signs and Symptoms: osteo A HPI Description: see chief complaint. 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 This is been present for 5-6 years. He underwent a course of IV any biotics and hyperbaric oxygen roughly 5 years ago. He was offered an amputation by orthopedics and I believe at the time I concurred with this however the patient wishes to up go via wait-and-see approach. He is actually done fairly well over the 5 years is still ambulatory. He uses a silver alginate-based dressing on the foot which he changes himself up. 05/27/15; patient returns for his monthly visit the. The status of his foot and his wound are essentially unchanged this patient has a chronic idiopathic neuropathy. He has underlying osteomyelitis. He had extensive orthopedic surgery roughly 3 or 4 years ago had. He has underlying chronic osteomyelitis.[name] He dresses this with silver alginate. We order the supplies. I continue to see him on a monthly basis for wound debridement. 06/24/15 the patient arrives here today for his monthly wound care check. He has underlying chronic osteomyelitis. The wound was surgically debridement of nonviable circumferential tissue. He is continued with silver alginate based stressing severe this area changed daily by his wife. Post debridement this actually looks as good as I've seen  this since quite some time 07/29/15. The patient has been coming here monthly for many years. He has a chronic wound on his right lateral foot at roughly the mid shaft of his fifth metatarsal. Initially he underwent a  M Medical Record Number: [id number] Patient Account Number: [id number] Date of Birth/Sex: Treating RN: 1951-03-22 (69 y.o. Nathan Miller) Carlene Coria Primary Care Provider: Sharmaine Base, [place] Other Clinician: Referring Provider: Treating Provider/Extender: Kris Hartmann, [place] Weeks in Treatment: 35 Active Problems ICD-10 Encounter Code Description Active Date MDM Diagnosis E11.621 Type 2 diabetes mellitus with foot ulcer 12/29/2019 No Yes 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 of other part of right foot with fat layer exposed 12/29/2019 No Yes E11.42 Type 2 diabetes mellitus with diabetic polyneuropathy 12/29/2019 No Yes M14.671 Charcot's joint, right ankle and foot 12/29/2019 No Yes Inactive Problems Resolved Problems Electronic Signature(s) Signed: 08/31/2020 4:57:00 PM By: Linton Ham MD Entered By: Linton Ham on 08/31/2020 10:19:51 -------------------------------------------------------------------------------- Progress Note Details Patient Name: Date of Service: Nathan Moll, PA UL D. 08/31/2020 9:00 A M Medical Record Number: [id number] Patient Account Number: [id number] Date of Birth/Sex: Treating RN: Mar 28, 1951 (69 y.o. Nathan Miller) Carlene Coria Primary Care Provider: York Pellant [place] Other Clinician: Referring Provider: Treating Provider/Extender: Kris Hartmann, [place] Weeks in Treatment: 35 Subjective History of Present Illness (HPI) The following HPI elements were documented for the patient's wound: Location: right foot Severity: Wag  3 Duration: #months Modifying Factors: Diabetic and charcot' foot.. Peripheral vascular disease Associated Signs and Symptoms: osteo see chief complaint. 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 This is been present for 5-6 years. He underwent a course of IV any biotics and hyperbaric oxygen roughly 5 years ago. He was offered an amputation by orthopedics and I believe at the time I concurred with this however the patient wishes to up go via wait-and-see approach. He is actually done fairly well over the 5 years is still ambulatory. He uses a silver alginate-based dressing on the foot which he changes himself up. 05/27/15; patient returns for his monthly visit the. The status of his foot and his wound are essentially unchanged this patient has a chronic idiopathic neuropathy. He has underlying osteomyelitis. He had extensive orthopedic surgery roughly 3 or 4 years ago had. He has underlying chronic osteomyelitis.[name] He dresses this with silver alginate. We order the supplies. I continue to see him on a monthly basis for wound debridement. 06/24/15 the patient arrives here today for his monthly wound care check. He has underlying chronic osteomyelitis. The wound was surgically debridement of nonviable circumferential tissue. He is continued with silver alginate based stressing severe this area changed daily by his wife. Post debridement this actually looks as good as I've seen this since quite some time 07/29/15. The patient has been coming here monthly for many years. He has a chronic wound on his right lateral foot at roughly the mid shaft of his fifth metatarsal. Initially he underwent a code course of IV antibiotics hyperbaric oxygen, resection I believe of his fifth and fourth metatarsal for chronic osteomyelitis. 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 He was offered an amputation at  some point but refused. He comes here for "palliative" wound care which requires extensive debridement of necrotic subcutaneous tissue to clean up the wound bed. He has been using silver alginate based dressings at home for many years. Although he has extensive edema of his foot, the situation has overall remained stable perhaps surprisingly so. 09/16/15; the patient has been coming here for many years. He is a chronic wound in his right lateral foot roughly the mid shaft of the fifth metatarsal.  on the lateral part of the wound. Clean surface require debridement. 2. Still using silver alginate on this area there has been progress 3. I think the only issue here is an offloading 1 but there is very little we can do to offload this further. He has an AFO brace and a custom made shoe on this side Electronic Signature(s) Signed: 08/31/2020 4:57:00 PM By: Linton Ham MD Entered By: Linton Ham on 08/31/2020 10:26:40 -------------------------------------------------------------------------------- SuperBill Details Patient Name: Date of Service: Nathan Miller, [place] UL D. 08/31/2020 Medical Record Number: [id number] Patient Account Number: [id number] Date of Birth/Sex: Treating RN: April 30, 1951 (69 y.o. 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, right ankle and foot Facility Procedures CPT4 Code: 76811572 Description: 62035 - DEB SUBQ TISSUE 20 SQ CM/< ICD-10 Diagnosis Description 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 of other part of right foot with fat layer exposed Modifier: Quantity: 1 Physician Procedures : CPT4 Code Description Modifier [id number] 84536 - WC PHYS SUBQ TISS 20 SQ CM ICD-10 Diagnosis Description  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 of other part of right foot with fat layer exposed Quantity: 1 Electronic Signature(s) Signed: 08/31/2020 4:57:00 PM By: Linton Ham MD Entered By: Linton Ham on 08/31/2020 10:26:55  M Medical Record Number: [id number] Patient Account Number: [id number] Date of Birth/Sex: Treating RN: 1951-03-22 (69 y.o. Nathan Miller) Carlene Coria Primary Care Provider: Sharmaine Base, [place] Other Clinician: Referring Provider: Treating Provider/Extender: Kris Hartmann, [place] Weeks in Treatment: 35 Active Problems ICD-10 Encounter Code Description Active Date MDM Diagnosis E11.621 Type 2 diabetes mellitus with foot ulcer 12/29/2019 No Yes 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 of other part of right foot with fat layer exposed 12/29/2019 No Yes E11.42 Type 2 diabetes mellitus with diabetic polyneuropathy 12/29/2019 No Yes M14.671 Charcot's joint, right ankle and foot 12/29/2019 No Yes Inactive Problems Resolved Problems Electronic Signature(s) Signed: 08/31/2020 4:57:00 PM By: Linton Ham MD Entered By: Linton Ham on 08/31/2020 10:19:51 -------------------------------------------------------------------------------- Progress Note Details Patient Name: Date of Service: Nathan Moll, PA UL D. 08/31/2020 9:00 A M Medical Record Number: [id number] Patient Account Number: [id number] Date of Birth/Sex: Treating RN: Mar 28, 1951 (69 y.o. Nathan Miller) Carlene Coria Primary Care Provider: York Pellant [place] Other Clinician: Referring Provider: Treating Provider/Extender: Kris Hartmann, [place] Weeks in Treatment: 35 Subjective History of Present Illness (HPI) The following HPI elements were documented for the patient's wound: Location: right foot Severity: Wag  3 Duration: #months Modifying Factors: Diabetic and charcot' foot.. Peripheral vascular disease Associated Signs and Symptoms: osteo see chief complaint. 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 This is been present for 5-6 years. He underwent a course of IV any biotics and hyperbaric oxygen roughly 5 years ago. He was offered an amputation by orthopedics and I believe at the time I concurred with this however the patient wishes to up go via wait-and-see approach. He is actually done fairly well over the 5 years is still ambulatory. He uses a silver alginate-based dressing on the foot which he changes himself up. 05/27/15; patient returns for his monthly visit the. The status of his foot and his wound are essentially unchanged this patient has a chronic idiopathic neuropathy. He has underlying osteomyelitis. He had extensive orthopedic surgery roughly 3 or 4 years ago had. He has underlying chronic osteomyelitis.[name] He dresses this with silver alginate. We order the supplies. I continue to see him on a monthly basis for wound debridement. 06/24/15 the patient arrives here today for his monthly wound care check. He has underlying chronic osteomyelitis. The wound was surgically debridement of nonviable circumferential tissue. He is continued with silver alginate based stressing severe this area changed daily by his wife. Post debridement this actually looks as good as I've seen this since quite some time 07/29/15. The patient has been coming here monthly for many years. He has a chronic wound on his right lateral foot at roughly the mid shaft of his fifth metatarsal. Initially he underwent a code course of IV antibiotics hyperbaric oxygen, resection I believe of his fifth and fourth metatarsal for chronic osteomyelitis. 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 He was offered an amputation at  some point but refused. He comes here for "palliative" wound care which requires extensive debridement of necrotic subcutaneous tissue to clean up the wound bed. He has been using silver alginate based dressings at home for many years. Although he has extensive edema of his foot, the situation has overall remained stable perhaps surprisingly so. 09/16/15; the patient has been coming here for many years. He is a chronic wound in his right lateral foot roughly the mid shaft of the fifth metatarsal.  foot and lymphedema from previous foot surgery. He has a wound on the plantar aspect of his heel laterally. I think this is a really an offloading issue more than anything else. Nevertheless the last time he was here he had some purulent drainage there was a culture done which showed a Citrobacter species. This was missed in our clinic and only came to my attention on 9/13. I sent in a prescription for ciprofloxacin to his [place] however for some reason [facility] is backordered on ciprofloxacin. Therefore he is never received it. He didn't call us nor did [facility] call us or fax [place] for that matter. He is using silver alginate to the wound which I think is better in terms of surface area 11/2; wound on the right lateral calcaneus in the setting of chronic Charcot foot and lymphedema from previous foot surgery. His wound is on the lateral part of his left heel Electronic Signature(s) Signed: 08/31/2020 4:57:00 PM By: Linton Ham MD Entered By: Linton Ham on 08/31/2020 10:22:31 -------------------------------------------------------------------------------- Physical Exam Details Patient Name: Date of Service: Nathan Moll, PA UL D. 08/31/2020 9:00 A M Medical Record Number: [id number] Patient Account Number: [id number] Date of Birth/Sex: Treating RN: 07-04-1951 (69 y.o. Nathan Miller) Carlene Coria Primary Care Provider: York Pellant [place] Other Clinician: Referring Provider: Treating Provider/Extender: Linton Ham [place], [place] Weeks in Treatment: 17 Constitutional Patient is hypertensive.. Pulse regular and within target range for patient.[name] Respirations regular, non-labored and within target range.. Temperature is normal and within the target range for the patient.[name] Appears in no  distress. Notes Wound exam; the patient had the lateral part of the wound covered and overhanging skin and subcutaneous tissue I remove this but a lot of the wound has epithelialized which is nice to see. I used a #5 curette to remove overhanging skin and subcutaneous tissue. I am hopeful that this will not be a recurrent issue. There is no surrounding erythema. The wound does not probe deeply Electronic Signature(s) Signed: 08/31/2020 4:57:00 PM By: Linton Ham MD Entered By: Linton Ham on 08/31/2020 10:25:40 -------------------------------------------------------------------------------- Physician Orders Details Patient Name: Date of Service: Nathan Miller, [place] UL D. 08/31/2020 9:00 A M Medical Record Number: [id number] Patient Account Number: [id number] Date of Birth/Sex: Treating RN: 08/31/1951 (69 y.o. 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 Dressing Change Frequency Wound #3 Right,Plantar Calcaneus Change dressing every day. Wound Cleansing Wound #3 Right,Plantar Calcaneus May shower and wash wound with soap and water. - on days that dressing is changed Primary Wound Dressing Wound #3 Right,Plantar Calcaneus Calcium Alginate with Silver Secondary Dressing Wound #3 Right,Plantar Calcaneus Foam - foam donut Kerlix/Rolled Gauze - secure with tape Dry Gauze Other: - ace wrap over dressing Off-Loading Turn and reposition every 2 hours Other: - float  heels off of bed/chair with pillow under calves Electronic Signature(s) Signed: 08/31/2020 4:57:00 PM By: Linton Ham MD Signed: 09/01/2020 9:08:43 AM By: Carlene Coria RN Entered By: Carlene Coria on 08/31/2020 09:26:00 -------------------------------------------------------------------------------- Problem List Details Patient Name: Date of Service: Nathan Moll, PA UL D. 08/31/2020 9:00 A  on the lateral part of the wound. Clean surface require debridement. 2. Still using silver alginate on this area there has been progress 3. I think the only issue here is an offloading 1 but there is very little we can do to offload this further. He has an AFO brace and a custom made shoe on this side Electronic Signature(s) Signed: 08/31/2020 4:57:00 PM By: Linton Ham MD Entered By: Linton Ham on 08/31/2020 10:26:40 -------------------------------------------------------------------------------- SuperBill Details Patient Name: Date of Service: Nathan Miller, [place] UL D. 08/31/2020 Medical Record Number: [id number] Patient Account Number: [id number] Date of Birth/Sex: Treating RN: April 30, 1951 (69 y.o. 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, right ankle and foot Facility Procedures CPT4 Code: 76811572 Description: 62035 - DEB SUBQ TISSUE 20 SQ CM/< ICD-10 Diagnosis Description 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 of other part of right foot with fat layer exposed Modifier: Quantity: 1 Physician Procedures : CPT4 Code Description Modifier [id number] 84536 - WC PHYS SUBQ TISS 20 SQ CM ICD-10 Diagnosis Description  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 of other part of right foot with fat layer exposed Quantity: 1 Electronic Signature(s) Signed: 08/31/2020 4:57:00 PM By: Linton Ham MD Entered By: Linton Ham on 08/31/2020 10:26:55  on the lateral part of the wound. Clean surface require debridement. 2. Still using silver alginate on this area there has been progress 3. I think the only issue here is an offloading 1 but there is very little we can do to offload this further. He has an AFO brace and a custom made shoe on this side Electronic Signature(s) Signed: 08/31/2020 4:57:00 PM By: Linton Ham MD Entered By: Linton Ham on 08/31/2020 10:26:40 -------------------------------------------------------------------------------- SuperBill Details Patient Name: Date of Service: Nathan Miller, [place] UL D. 08/31/2020 Medical Record Number: [id number] Patient Account Number: [id number] Date of Birth/Sex: Treating RN: April 30, 1951 (69 y.o. 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, right ankle and foot Facility Procedures CPT4 Code: 76811572 Description: 62035 - DEB SUBQ TISSUE 20 SQ CM/< ICD-10 Diagnosis Description 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 of other part of right foot with fat layer exposed Modifier: Quantity: 1 Physician Procedures : CPT4 Code Description Modifier [id number] 84536 - WC PHYS SUBQ TISS 20 SQ CM ICD-10 Diagnosis Description  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 of other part of right foot with fat layer exposed Quantity: 1 Electronic Signature(s) Signed: 08/31/2020 4:57:00 PM By: Linton Ham MD Entered By: Linton Ham on 08/31/2020 10:26:55  foot and lymphedema from previous foot surgery. He has a wound on the plantar aspect of his heel laterally. I think this is a really an offloading issue more than anything else. Nevertheless the last time he was here he had some purulent drainage there was a culture done which showed a Citrobacter species. This was missed in our clinic and only came to my attention on 9/13. I sent in a prescription for ciprofloxacin to his [place] however for some reason [facility] is backordered on ciprofloxacin. Therefore he is never received it. He didn't call us nor did [facility] call us or fax [place] for that matter. He is using silver alginate to the wound which I think is better in terms of surface area 11/2; wound on the right lateral calcaneus in the setting of chronic Charcot foot and lymphedema from previous foot surgery. His wound is on the lateral part of his left heel Electronic Signature(s) Signed: 08/31/2020 4:57:00 PM By: Linton Ham MD Entered By: Linton Ham on 08/31/2020 10:22:31 -------------------------------------------------------------------------------- Physical Exam Details Patient Name: Date of Service: Nathan Moll, PA UL D. 08/31/2020 9:00 A M Medical Record Number: [id number] Patient Account Number: [id number] Date of Birth/Sex: Treating RN: 07-04-1951 (69 y.o. Nathan Miller) Carlene Coria Primary Care Provider: York Pellant [place] Other Clinician: Referring Provider: Treating Provider/Extender: Linton Ham [place], [place] Weeks in Treatment: 17 Constitutional Patient is hypertensive.. Pulse regular and within target range for patient.[name] Respirations regular, non-labored and within target range.. Temperature is normal and within the target range for the patient.[name] Appears in no  distress. Notes Wound exam; the patient had the lateral part of the wound covered and overhanging skin and subcutaneous tissue I remove this but a lot of the wound has epithelialized which is nice to see. I used a #5 curette to remove overhanging skin and subcutaneous tissue. I am hopeful that this will not be a recurrent issue. There is no surrounding erythema. The wound does not probe deeply Electronic Signature(s) Signed: 08/31/2020 4:57:00 PM By: Linton Ham MD Entered By: Linton Ham on 08/31/2020 10:25:40 -------------------------------------------------------------------------------- Physician Orders Details Patient Name: Date of Service: Nathan Miller, [place] UL D. 08/31/2020 9:00 A M Medical Record Number: [id number] Patient Account Number: [id number] Date of Birth/Sex: Treating RN: 08/31/1951 (69 y.o. 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 Dressing Change Frequency Wound #3 Right,Plantar Calcaneus Change dressing every day. Wound Cleansing Wound #3 Right,Plantar Calcaneus May shower and wash wound with soap and water. - on days that dressing is changed Primary Wound Dressing Wound #3 Right,Plantar Calcaneus Calcium Alginate with Silver Secondary Dressing Wound #3 Right,Plantar Calcaneus Foam - foam donut Kerlix/Rolled Gauze - secure with tape Dry Gauze Other: - ace wrap over dressing Off-Loading Turn and reposition every 2 hours Other: - float  heels off of bed/chair with pillow under calves Electronic Signature(s) Signed: 08/31/2020 4:57:00 PM By: Linton Ham MD Signed: 09/01/2020 9:08:43 AM By: Carlene Coria RN Entered By: Carlene Coria on 08/31/2020 09:26:00 -------------------------------------------------------------------------------- Problem List Details Patient Name: Date of Service: Nathan Moll, [place] UL D. 08/31/2020 9:00 A  foot and lymphedema from previous foot surgery. He has a wound on the plantar aspect of his heel laterally. I think this is a really an offloading issue more than anything else. Nevertheless the last time he was here he had some purulent drainage there was a culture done which showed a Citrobacter species. This was missed in our clinic and only came to my attention on 9/13. I sent in a prescription for ciprofloxacin to his [place] however for some reason [facility] is backordered on ciprofloxacin. Therefore he is never received it. He didn't call us nor did [facility] call us or fax [place] for that matter. He is using silver alginate to the wound which I think is better in terms of surface area 11/2; wound on the right lateral calcaneus in the setting of chronic Charcot foot and lymphedema from previous foot surgery. His wound is on the lateral part of his left heel Electronic Signature(s) Signed: 08/31/2020 4:57:00 PM By: Linton Ham MD Entered By: Linton Ham on 08/31/2020 10:22:31 -------------------------------------------------------------------------------- Physical Exam Details Patient Name: Date of Service: Nathan Moll, [place] UL D. 08/31/2020 9:00 A M Medical Record Number: [id number] Patient Account Number: [id number] Date of Birth/Sex: Treating RN: 07-04-1951 (69 y.o. Nathan Miller) Carlene Coria Primary Care Provider: York Pellant [place] Other Clinician: Referring Provider: Treating Provider/Extender: Linton Ham [place], [place] Weeks in Treatment: 17 Constitutional Patient is hypertensive.. Pulse regular and within target range for patient.[name] Respirations regular, non-labored and within target range.. Temperature is normal and within the target range for the patient.[name] Appears in no  distress. Notes Wound exam; the patient had the lateral part of the wound covered and overhanging skin and subcutaneous tissue I remove this but a lot of the wound has epithelialized which is nice to see. I used a #5 curette to remove overhanging skin and subcutaneous tissue. I am hopeful that this will not be a recurrent issue. There is no surrounding erythema. The wound does not probe deeply Electronic Signature(s) Signed: 08/31/2020 4:57:00 PM By: Linton Ham MD Entered By: Linton Ham on 08/31/2020 10:25:40 -------------------------------------------------------------------------------- Physician Orders Details Patient Name: Date of Service: Nathan Miller, [place] UL D. 08/31/2020 9:00 A M Medical Record Number: [id number] Patient Account Number: [id number] Date of Birth/Sex: Treating RN: 08/31/1951 (69 y.o. 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 Dressing Change Frequency Wound #3 Right,Plantar Calcaneus Change dressing every day. Wound Cleansing Wound #3 Right,Plantar Calcaneus May shower and wash wound with soap and water. - on days that dressing is changed Primary Wound Dressing Wound #3 Right,Plantar Calcaneus Calcium Alginate with Silver Secondary Dressing Wound #3 Right,Plantar Calcaneus Foam - foam donut Kerlix/Rolled Gauze - secure with tape Dry Gauze Other: - ace wrap over dressing Off-Loading Turn and reposition every 2 hours Other: - float  heels off of bed/chair with pillow under calves Electronic Signature(s) Signed: 08/31/2020 4:57:00 PM By: Linton Ham MD Signed: 09/01/2020 9:08:43 AM By: Carlene Coria RN Entered By: Carlene Coria on 08/31/2020 09:26:00 -------------------------------------------------------------------------------- Problem List Details Patient Name: Date of Service: Nathan Moll, PA UL D. 08/31/2020 9:00 A

## 2020-09-02 NOTE — Progress Notes (Signed)
Nathan Miller, [facility] ([id number]) Visit Report for 08/31/2020 Arrival Information Details Patient Name: Date of Service: Nathan Miller, [place] D. 08/31/2020 9:00 A M Medical Record Number: [id number] Patient Account Number: [id number] Date of Birth/Sex: Treating RN: [birth year] (69 y.o. Ernestene Mention Primary Care Miakoda Mcmillion: Sharmaine Base, [place] Other Clinician: Referring Nakyia Dau: Treating Latrica Clowers/Extender: Kris Hartmann, [place] Weeks in Treatment: 19 Visit Information History Since Last Visit Added or deleted any medications: No Patient Arrived: [name] Any new allergies or adverse reactions: No Arrival Time: 08:48 Had a fall or experienced change in No Accompanied By: spouse activities of daily living that may affect Transfer Assistance: None risk of falls: Patient Requires Transmission-Based Precautions: No Signs or symptoms of abuse/neglect since last visito No Patient Has Alerts: No Hospitalized since last visit: No Implantable device outside of the clinic excluding No cellular tissue based products placed in the center since last visit: Has Dressing in Place as Prescribed: Yes Has Footwear/Offloading in Place as Prescribed: Yes Right: Other:custom diabetic shoe Pain Present Now: No Electronic Signature(s) Signed: 09/01/2020 5:51:45 PM By: Baruch Gouty RN, BSN Entered By: Baruch Gouty on 08/31/2020 08:50:33 -------------------------------------------------------------------------------- Encounter Discharge Information Details Patient Name: Date of Service: Nathan Moll, PA UL D. 08/31/2020 9:00 A M Medical Record Number: [id number] Patient Account Number: [id number] Date of Birth/Sex: Treating RN: [birth year] (69 y.o. [name] Primary Care Trashawn Oquendo: York Pellant [place] Other Clinician: Referring Lannie Yusuf: Treating Kerline Trahan/Extender: Kris Hartmann, [place] Weeks in Treatment: 35 Encounter Discharge Information Items Post  Procedure Vitals Discharge Condition: Stable Temperature (F): 97.6 Ambulatory Status: Ambulatory Pulse (bpm): 73 Discharge Destination: Home Respiratory Rate (breaths/min): 18 Transportation: Private Auto Blood Pressure (mmHg): 172/95 Accompanied By: alone Schedule Follow-up Appointment: Yes Clinical Summary of Care: Patient Declined Electronic Signature(s) Signed: 09/02/2020 5:54:11 PM By: Levan Hurst RN, BSN Entered By: Levan Hurst on 08/31/2020 12:19:26 -------------------------------------------------------------------------------- Lower Extremity Assessment Details Patient Name: Date of Service: Nathan Moll, PA UL D. 08/31/2020 9:00 A M Medical Record Number: [id number] Patient Account Number: [id number] Date of Birth/Sex: Treating RN: 1951/06/05 (69 y.o. Ernestene Mention Primary Care Cara Aguino: Sharmaine Base, [place] Other Clinician: Referring Deepa Barthel: Treating Norina Cowper/Extender: Kris Hartmann, [place] Weeks in Treatment: 35 Edema Assessment Assessed: [Left: No] [Right: No] Edema: [Left: Ye] [Right: s] Calf Left: Right: Point of Measurement: From Medial Instep 40.4 cm Ankle Left: Right: Point of Measurement: From Medial Instep 28.2 cm Vascular Assessment Pulses: Dorsalis Pedis Palpable: [Right:Yes] Electronic Signature(s) Signed: 09/01/2020 5:51:45 PM By: Baruch Gouty RN, BSN Entered By: Baruch Gouty on 08/31/2020 09:02:06 -------------------------------------------------------------------------------- Multi Wound Chart Details Patient Name: Date of Service: Nathan Moll, PA UL D. 08/31/2020 9:00 A M Medical Record Number: [id number] Patient Account Number: [id number] Date of Birth/Sex: Treating RN: 1950/12/12 (68 y.o. M) Carlene Coria Primary Care Wadell Craddock: York Pellant [place] Other Clinician: Referring Chauncey Sciulli: Treating Eudelia Hiltunen/Extender: Kris Hartmann, [place] Weeks in Treatment: 35 Vital  Signs Height(in): 68 Pulse(bpm): 48 Weight(lbs): 253 Blood Pressure(mmHg): 172/95 Body Mass Index(BMI): 38 Temperature(F): 97.6 Respiratory Rate(breaths/min): 18 Photos: [3:No Photos Right, Plantar Calcaneus] [N/A:N/A N/A] Wound Location: [3:Blister] [N/A:N/A] Wounding Event: [3:Diabetic Wound/Ulcer of the Lower] [N/A:N/A] Primary Etiology: [3:Extremity Coronary Artery Disease,] [N/A:N/A] Comorbid History: [3:Hypertension, Type II Diabetes, Osteoarthritis, Osteomyelitis, Neuropathy, Confinement Anxiety 09/30/2019] [N/A:N/A] Date Acquired: [3:35] [N/A:N/A] Weeks of Treatment: [3:Open] [N/A:N/A] Wound Status: [3:1x0.4x0.7] [N/A:N/A] Measurements L x W x D (cm) [3:0.314] [N/A:N/A] A (cm) : rea [3:0.22] [N/A:N/A] Volume (cm) : [3:80.80%] [N/A:N/A] %  Birth/Sex: Treating  RN: 14-Sep-1951 (70 y.o. Ernestene Mention Primary Care Justyce Baby: Sharmaine Base, [place] Other Clinician: Referring Chae Oommen: Treating Neima Lacross/Extender: Kris Hartmann, [place] Weeks in Treatment: 35 Wound Status Wound Number: 3 Primary Diabetic Wound/Ulcer of the Lower Extremity Etiology: Wound Location: Right, Plantar Calcaneus Wound Open Wounding Event: Blister Status: Date Acquired: 09/30/2019 Comorbid Coronary Artery Disease, Hypertension, Type II Diabetes, Weeks Of Treatment: 35 History: Osteoarthritis, Osteomyelitis, Neuropathy, Confinement Anxiety Clustered Wound: No Wound Measurements Length: (cm) 1 Width: (cm) 0.4 Depth: (cm) 0.7 Area: (cm) 0.314 Volume: (cm) 0.22 % Reduction in Area: 80.8% % Reduction in Volume: 77.6% Epithelialization: Small (1-33%) Tunneling: No Undermining: Yes Starting Position (o'clock): 12 Ending Position (o'clock): 6 Maximum Distance: (cm) 1.2 Wound Description Classification: Grade 2 Wound Margin: Thickened Exudate Amount: Small Exudate Type: Serosanguineous Exudate Color: red, brown Wound Bed Granulation Amount: Large (67-100%) Granulation Quality: Pink Necrotic Amount: None Present (0%) Foul Odor After Cleansing: No Slough/Fibrino No Exposed Structure Fascia Exposed: No Fat Layer (Subcutaneous Tissue) Exposed: Yes Tendon Exposed: No Muscle Exposed: No Joint Exposed: No Bone Exposed: No Treatment Notes Wound #3 (Right, Plantar Calcaneus) 1. Cleanse With Wound Cleanser 3. Primary Dressing Applied Calcium Alginate Ag 4. Secondary Dressing Dry Gauze Roll Gauze Foam 5. Secured With Tape Notes netting. foam donut Electronic Signature(s) Signed: 09/01/2020 5:51:45 PM By: Baruch Gouty RN, BSN Entered By: Baruch Gouty on 08/31/2020 09:03:16 -------------------------------------------------------------------------------- [place] Details Patient Name: Date of Service: Nathan Moll, PA UL D.  08/31/2020 9:00 A M Medical Record Number: [id number] Patient Account Number: [id number] Date of Birth/Sex: Treating RN: [birth year] (69 y.o. Ernestene Mention Primary Care Samyiah Halvorsen: Sharmaine Base, [place] Other Clinician: Referring Bianka Liberati: Treating Inioluwa Baris/Extender: Kris Hartmann, [place] Weeks in Treatment: 35 Vital Signs Time Taken: 08:52 Temperature (F): 97.6 Height (in): 68 Pulse (bpm): 73 Source: Stated Respiratory Rate (breaths/min): 18 Weight (lbs): 253 Blood Pressure (mmHg): 172/95 Source: Stated Reference Range: 80 - 120 mg / dl Body Mass Index (BMI): 38.5 Notes pt did not check blood sugar this am Electronic Signature(s) Signed: 09/01/2020 5:51:45 PM By: Baruch Gouty RN, BSN Entered By: Baruch Gouty on 08/31/2020 08:55:03  Birth/Sex: Treating  RN: 14-Sep-1951 (70 y.o. Ernestene Mention Primary Care Justyce Baby: Sharmaine Base, [place] Other Clinician: Referring Chae Oommen: Treating Neima Lacross/Extender: Kris Hartmann, [place] Weeks in Treatment: 35 Wound Status Wound Number: 3 Primary Diabetic Wound/Ulcer of the Lower Extremity Etiology: Wound Location: Right, Plantar Calcaneus Wound Open Wounding Event: Blister Status: Date Acquired: 09/30/2019 Comorbid Coronary Artery Disease, Hypertension, Type II Diabetes, Weeks Of Treatment: 35 History: Osteoarthritis, Osteomyelitis, Neuropathy, Confinement Anxiety Clustered Wound: No Wound Measurements Length: (cm) 1 Width: (cm) 0.4 Depth: (cm) 0.7 Area: (cm) 0.314 Volume: (cm) 0.22 % Reduction in Area: 80.8% % Reduction in Volume: 77.6% Epithelialization: Small (1-33%) Tunneling: No Undermining: Yes Starting Position (o'clock): 12 Ending Position (o'clock): 6 Maximum Distance: (cm) 1.2 Wound Description Classification: Grade 2 Wound Margin: Thickened Exudate Amount: Small Exudate Type: Serosanguineous Exudate Color: red, brown Wound Bed Granulation Amount: Large (67-100%) Granulation Quality: Pink Necrotic Amount: None Present (0%) Foul Odor After Cleansing: No Slough/Fibrino No Exposed Structure Fascia Exposed: No Fat Layer (Subcutaneous Tissue) Exposed: Yes Tendon Exposed: No Muscle Exposed: No Joint Exposed: No Bone Exposed: No Treatment Notes Wound #3 (Right, Plantar Calcaneus) 1. Cleanse With Wound Cleanser 3. Primary Dressing Applied Calcium Alginate Ag 4. Secondary Dressing Dry Gauze Roll Gauze Foam 5. Secured With Tape Notes netting. foam donut Electronic Signature(s) Signed: 09/01/2020 5:51:45 PM By: Baruch Gouty RN, BSN Entered By: Baruch Gouty on 08/31/2020 09:03:16 -------------------------------------------------------------------------------- [place] Details Patient Name: Date of Service: Nathan Moll, PA UL D.  08/31/2020 9:00 A M Medical Record Number: [id number] Patient Account Number: [id number] Date of Birth/Sex: Treating RN: [birth year] (69 y.o. Ernestene Mention Primary Care Samyiah Halvorsen: Sharmaine Base, [place] Other Clinician: Referring Bianka Liberati: Treating Inioluwa Baris/Extender: Kris Hartmann, [place] Weeks in Treatment: 35 Vital Signs Time Taken: 08:52 Temperature (F): 97.6 Height (in): 68 Pulse (bpm): 73 Source: Stated Respiratory Rate (breaths/min): 18 Weight (lbs): 253 Blood Pressure (mmHg): 172/95 Source: Stated Reference Range: 80 - 120 mg / dl Body Mass Index (BMI): 38.5 Notes pt did not check blood sugar this am Electronic Signature(s) Signed: 09/01/2020 5:51:45 PM By: Baruch Gouty RN, BSN Entered By: Baruch Gouty on 08/31/2020 08:55:03

## 2020-09-06 DIAGNOSIS — R0602 Shortness of breath: Secondary | ICD-10-CM | POA: Diagnosis not present

## 2020-09-06 DIAGNOSIS — Z20822 Contact with and (suspected) exposure to covid-19: Secondary | ICD-10-CM | POA: Diagnosis not present

## 2020-09-06 DIAGNOSIS — R059 Cough, unspecified: Secondary | ICD-10-CM | POA: Diagnosis not present

## 2020-09-27 ENCOUNTER — Ambulatory Visit (INDEPENDENT_AMBULATORY_CARE_PROVIDER_SITE_OTHER): Payer: PPO

## 2020-09-27 DIAGNOSIS — I442 Atrioventricular block, complete: Secondary | ICD-10-CM | POA: Diagnosis not present

## 2020-09-27 LAB — CUP PACEART REMOTE DEVICE CHECK
Battery Remaining Longevity: 61 mo
Battery Voltage: 2.99 V
Brady Statistic RV Percent Paced: 99.76 %
Date Time Interrogation Session: 20211129102338
Implantable Lead Implant Date: 20160325
Implantable Lead Location: 753860
Implantable Lead Model: 5076
Implantable Pulse Generator Implant Date: 20160325
Lead Channel Impedance Value: 475 Ohm
Lead Channel Impedance Value: 513 Ohm
Lead Channel Pacing Threshold Amplitude: 0.5 V
Lead Channel Pacing Threshold Pulse Width: 0.4 ms
Lead Channel Sensing Intrinsic Amplitude: 8.75 mV
Lead Channel Sensing Intrinsic Amplitude: 8.75 mV
Lead Channel Setting Pacing Amplitude: 2.5 V
Lead Channel Setting Pacing Pulse Width: 0.4 ms
Lead Channel Setting Sensing Sensitivity: 0.9 mV

## 2020-09-28 ENCOUNTER — Encounter (HOSPITAL_BASED_OUTPATIENT_CLINIC_OR_DEPARTMENT_OTHER): Payer: PPO | Admitting: Internal Medicine

## 2020-10-01 NOTE — Progress Notes (Signed)
Remote pacemaker transmission.   

## 2020-10-05 ENCOUNTER — Encounter (HOSPITAL_BASED_OUTPATIENT_CLINIC_OR_DEPARTMENT_OTHER): Payer: PPO | Attending: Internal Medicine | Admitting: Internal Medicine

## 2020-10-05 ENCOUNTER — Other Ambulatory Visit: Payer: Self-pay

## 2020-10-05 DIAGNOSIS — E1142 Type 2 diabetes mellitus with diabetic polyneuropathy: Secondary | ICD-10-CM | POA: Diagnosis not present

## 2020-10-05 DIAGNOSIS — I2510 Atherosclerotic heart disease of native coronary artery without angina pectoris: Secondary | ICD-10-CM | POA: Diagnosis not present

## 2020-10-05 DIAGNOSIS — L97512 Non-pressure chronic ulcer of other part of right foot with fat layer exposed: Secondary | ICD-10-CM | POA: Diagnosis not present

## 2020-10-05 DIAGNOSIS — I890 Lymphedema, not elsewhere classified: Secondary | ICD-10-CM | POA: Diagnosis not present

## 2020-10-05 DIAGNOSIS — E1151 Type 2 diabetes mellitus with diabetic peripheral angiopathy without gangrene: Secondary | ICD-10-CM | POA: Insufficient documentation

## 2020-10-05 DIAGNOSIS — E11610 Type 2 diabetes mellitus with diabetic neuropathic arthropathy: Secondary | ICD-10-CM | POA: Diagnosis not present

## 2020-10-05 DIAGNOSIS — E11621 Type 2 diabetes mellitus with foot ulcer: Secondary | ICD-10-CM | POA: Diagnosis not present

## 2020-10-05 NOTE — Progress Notes (Signed)
[name], DAILY ([id number]) Visit Report for 10/05/2020 Arrival Information Details Patient Name: Date of Service: Nathan Miller, [place] D. 10/05/2020 9:30 A M Medical Record Number: [id number] Patient Account Number: [id number] Date of Birth/Sex: Treating RN: [birth year] (69 y.o. Nathan Miller, Nathan Miller Primary Care Ronette Hank: Sharmaine Base, [place] Other Clinician: Referring Altonio Schwertner: Treating Detrick Dani/Extender: Kris Hartmann, [place] Weeks in Treatment: 68 Visit Information History Since Last Visit Added or deleted any medications: No Patient Arrived: [name] Any new allergies or adverse reactions: No Arrival Time: 09:07 Had a fall or experienced change in No Accompanied By: spouse activities of daily living that may affect Transfer Assistance: None risk of falls: Patient Identification Verified: Yes Signs or symptoms of abuse/neglect since last visito No Secondary Verification Process Completed: Yes Hospitalized since last visit: No Patient Requires Transmission-Based Precautions: No Implantable device outside of the clinic excluding No Patient Has Alerts: No cellular tissue based products placed in the center since last visit: Has Dressing in Place as Prescribed: Yes Has Footwear/Offloading in Place as Prescribed: Yes Right: Other:custom diabetic shoe Pain Present Now: No Electronic Signature(s) Signed: 10/05/2020 5:33:51 PM By: Baruch Gouty RN, BSN Entered By: Baruch Gouty on 10/05/2020 09:09:22 -------------------------------------------------------------------------------- Clinic Level of Care Assessment Details Patient Name: Date of Service: Nathan Miller, [place] D. 10/05/2020 9:30 A M Medical Record Number: [id number] Patient Account Number: [id number] Date of Birth/Sex: Treating RN: 03-Mar-1951 (68 y.o. Nathan Miller) Carlene Coria Primary Care Kali Ambler: York Pellant [place] Other Clinician: Referring Kylie Gros: Treating Ora Bollig/Extender: Linton Ham [place],  [place] Weeks in Treatment: 40 Clinic Level of Care Assessment Items TOOL 4 Quantity Score X- 1 0 Use when only an EandM is performed on FOLLOW-UP visit ASSESSMENTS - Nursing Assessment / Reassessment X- 1 10 Reassessment of Co-morbidities (includes updates in patient status) X- 1 5 Reassessment of Adherence to Treatment Plan ASSESSMENTS - Wound and Skin A ssessment / Reassessment X - Simple Wound Assessment / Reassessment - one wound 1 5 []  - 0 Complex Wound Assessment / Reassessment - multiple wounds []  - 0 Dermatologic / Skin Assessment (not related to wound area) ASSESSMENTS - Focused Assessment []  - 0 Circumferential Edema Measurements - multi extremities []  - 0 Nutritional Assessment / Counseling / Intervention []  - 0 Lower Extremity Assessment (monofilament, tuning fork, pulses) []  - 0 Peripheral Arterial Disease Assessment (using hand held doppler) ASSESSMENTS - Ostomy and/or Continence Assessment and Care []  - 0 Incontinence Assessment and Management []  - 0 Ostomy Care Assessment and Management (repouching, etc.) PROCESS - Coordination of Care X - Simple Patient / Family Education for ongoing care 1 15 []  - 0 Complex (extensive) Patient / Family Education for ongoing care X- 1 10 Staff obtains Programmer, systems, Records, T Results / Process Orders est []  - 0 Staff telephones HHA, Nursing Homes / Clarify orders / etc []  - 0 Routine Transfer to another Facility (non-emergent condition) []  - 0 Routine Hospital Admission (non-emergent condition) []  - 0 New Admissions / Biomedical engineer / Ordering NPWT Apligraf, etc. , []  - 0 Emergency Hospital Admission (emergent condition) X- 1 10 Simple Discharge Coordination []  - 0 Complex (extensive) Discharge Coordination PROCESS - Special Needs []  - 0 Pediatric / Minor Patient Management []  - 0 Isolation Patient Management []  - 0 Hearing / Language / Visual special needs []  - 0 Assessment of Community  assistance (transportation, D/C planning, etc.) []  - 0 Additional assistance / Altered mentation []  - 0 Support Surface(s) Assessment (bed, cushion, seat, etc.) INTERVENTIONS -  [name], DAILY ([id number]) Visit Report for 10/05/2020 Arrival Information Details Patient Name: Date of Service: Nathan Miller, [place] D. 10/05/2020 9:30 A M Medical Record Number: [id number] Patient Account Number: [id number] Date of Birth/Sex: Treating RN: [birth year] (69 y.o. Nathan Miller, Nathan Miller Primary Care Ronette Hank: Sharmaine Base, [place] Other Clinician: Referring Altonio Schwertner: Treating Detrick Dani/Extender: Kris Hartmann, [place] Weeks in Treatment: 68 Visit Information History Since Last Visit Added or deleted any medications: No Patient Arrived: [name] Any new allergies or adverse reactions: No Arrival Time: 09:07 Had a fall or experienced change in No Accompanied By: spouse activities of daily living that may affect Transfer Assistance: None risk of falls: Patient Identification Verified: Yes Signs or symptoms of abuse/neglect since last visito No Secondary Verification Process Completed: Yes Hospitalized since last visit: No Patient Requires Transmission-Based Precautions: No Implantable device outside of the clinic excluding No Patient Has Alerts: No cellular tissue based products placed in the center since last visit: Has Dressing in Place as Prescribed: Yes Has Footwear/Offloading in Place as Prescribed: Yes Right: Other:custom diabetic shoe Pain Present Now: No Electronic Signature(s) Signed: 10/05/2020 5:33:51 PM By: Baruch Gouty RN, BSN Entered By: Baruch Gouty on 10/05/2020 09:09:22 -------------------------------------------------------------------------------- Clinic Level of Care Assessment Details Patient Name: Date of Service: Nathan Miller, [place] D. 10/05/2020 9:30 A M Medical Record Number: [id number] Patient Account Number: [id number] Date of Birth/Sex: Treating RN: 03-Mar-1951 (68 y.o. Nathan Miller) Carlene Coria Primary Care Kali Ambler: York Pellant [place] Other Clinician: Referring Kylie Gros: Treating Ora Bollig/Extender: Linton Ham [place],  [place] Weeks in Treatment: 40 Clinic Level of Care Assessment Items TOOL 4 Quantity Score X- 1 0 Use when only an EandM is performed on FOLLOW-UP visit ASSESSMENTS - Nursing Assessment / Reassessment X- 1 10 Reassessment of Co-morbidities (includes updates in patient status) X- 1 5 Reassessment of Adherence to Treatment Plan ASSESSMENTS - Wound and Skin A ssessment / Reassessment X - Simple Wound Assessment / Reassessment - one wound 1 5 []  - 0 Complex Wound Assessment / Reassessment - multiple wounds []  - 0 Dermatologic / Skin Assessment (not related to wound area) ASSESSMENTS - Focused Assessment []  - 0 Circumferential Edema Measurements - multi extremities []  - 0 Nutritional Assessment / Counseling / Intervention []  - 0 Lower Extremity Assessment (monofilament, tuning fork, pulses) []  - 0 Peripheral Arterial Disease Assessment (using hand held doppler) ASSESSMENTS - Ostomy and/or Continence Assessment and Care []  - 0 Incontinence Assessment and Management []  - 0 Ostomy Care Assessment and Management (repouching, etc.) PROCESS - Coordination of Care X - Simple Patient / Family Education for ongoing care 1 15 []  - 0 Complex (extensive) Patient / Family Education for ongoing care X- 1 10 Staff obtains Programmer, systems, Records, T Results / Process Orders est []  - 0 Staff telephones HHA, Nursing Homes / Clarify orders / etc []  - 0 Routine Transfer to another Facility (non-emergent condition) []  - 0 Routine Hospital Admission (non-emergent condition) []  - 0 New Admissions / Biomedical engineer / Ordering NPWT Apligraf, etc. , []  - 0 Emergency Hospital Admission (emergent condition) X- 1 10 Simple Discharge Coordination []  - 0 Complex (extensive) Discharge Coordination PROCESS - Special Needs []  - 0 Pediatric / Minor Patient Management []  - 0 Isolation Patient Management []  - 0 Hearing / Language / Visual special needs []  - 0 Assessment of Community  assistance (transportation, D/C planning, etc.) []  - 0 Additional assistance / Altered mentation []  - 0 Support Surface(s) Assessment (bed, cushion, seat, etc.) INTERVENTIONS -  09/30/2019] [N/A:N/A] Date Acquired: [3:40] [N/A:N/A] Weeks of Treatment: [3:Open] [N/A:N/A] Wound Status: [3:0x0x0] [N/A:N/A] Measurements L x W x D (cm) [3:0] [N/A:N/A] A (cm) : rea [3:0] [N/A:N/A] Volume (cm) : [3:100.00%] [N/A:N/A] % Reduction in A rea: [3:100.00%] [N/A:N/A] % Reduction in Volume: [3:Grade 2] [N/A:N/A] Classification: [3:None Present] [N/A:N/A] Exudate A mount: [3:Thickened] [N/A:N/A] Wound Margin: [3:None Present (0%)] [N/A:N/A] Granulation A mount: [3:None Present (0%)] [N/A:N/A] Necrotic A mount: [3:Fascia: No] [N/A:N/A] Exposed Structures: [3:Fat Layer (Subcutaneous Tissue): No Tendon: No Muscle: No Joint: No Bone: No Large (67-100%)] [N/A:N/A] Treatment Notes Electronic Signature(s) Signed: 10/05/2020 5:06:48 PM By: Linton Ham MD Signed: 10/05/2020 5:28:36 PM By: Carlene Coria RN Entered By: Linton Ham on 10/05/2020 09:29:00 -------------------------------------------------------------------------------- Multi-Disciplinary Care Plan Details Patient Name: Date of Service: Nathan Moll, [place] D. 10/05/2020 9:30 A M Medical Record Number: [id number] Patient Account Number: [id number] Date of Birth/Sex: Treating RN: [birth year] (68 y.o. Oval Linsey Primary Care Ennis Delpozo: York Pellant [place] Other Clinician: Referring Debe Anfinson: Treating  Antoine Fiallos/Extender: Kris Hartmann, [place] Weeks in Treatment: 40 Active Inactive Electronic Signature(s) Signed: 10/05/2020 5:28:36 PM By: Carlene Coria RN Entered By: Carlene Coria on 10/05/2020 09:24:03 -------------------------------------------------------------------------------- Pain Assessment Details Patient Name: Date of Service: Nathan Moll, [place] D. 10/05/2020 9:30 A M Medical Record Number: [id number] Patient Account Number: [id number] Date of Birth/Sex: Treating RN: [birth year] (69 y.o. Ernestene Mention Primary Care Jamus Loving: Sharmaine [place], [place] Other Clinician: Referring Rozina Pointer: Treating Timmia Cogburn/Extender: Kris Hartmann, [place] Weeks in Treatment: 40 Active Problems Location of Pain Severity and Description of Pain Patient Has Paino No Site Locations Rate the pain. Current Pain Level: 0 Pain Management and Medication Current Pain Management: Electronic Signature(s) Signed: 10/05/2020 5:33:51 PM By: Baruch Gouty RN, BSN Entered By: Baruch Gouty on 10/05/2020 09:14:08 -------------------------------------------------------------------------------- Patient/Caregiver Education Details Patient Name: Date of Service: Nathan Moll, [place] D. 12/7/2021andnbsp9:30 A M Medical Record Number: [id number] Patient Account Number: [id number] Date of Birth/Gender: Treating RN: 1951/01/10 (68 y.o. Nathan Miller) Carlene Coria Primary Care Physician: York Pellant [place] Other Clinician: Referring Physician: Treating Physician/Extender: Kris Hartmann, [place] Weeks in Treatment: 40 Education Assessment Education Provided To: Patient Education Topics Provided Wound/Skin Impairment: Methods: Explain/Verbal Responses: State content correctly Electronic Signature(s) Signed: 10/05/2020 5:28:36 PM By: Carlene Coria RN Entered By: Carlene Coria on 10/05/2020  09:24:14 -------------------------------------------------------------------------------- Wound Assessment Details Patient Name: Date of Service: Nathan Moll, [place] D. 10/05/2020 9:30 A M Medical Record Number: [id number] Patient Account Number: [id number] Date of Birth/Sex: Treating RN: August 25, 1951 (69 y.o. 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 Location: Right, Plantar Calcaneus Wound Open Wounding Event: Blister Status: Date Acquired: 09/30/2019 Comorbid Coronary Artery Disease, Hypertension, Type II Diabetes, Weeks Of Treatment: 40 History: Osteoarthritis, Osteomyelitis, Neuropathy, Confinement Anxiety Clustered Wound: No Wound Measurements Length: (cm) Width: (cm) Depth: (cm) Area: (cm) Volume: (cm) 0 % Reduction in Area: 100% 0 % Reduction in Volume: 100% 0 Epithelialization: Large (67-100%) 0 Tunneling: No 0 Undermining: No Wound Description Classification: Grade 2 Wound Margin: Thickened Exudate Amount: None Present Foul Odor After Cleansing: No Slough/Fibrino No Wound Bed Granulation Amount: None Present (0%) Exposed Structure Necrotic Amount: None Present (0%) Fascia Exposed: No Fat Layer (Subcutaneous Tissue) Exposed: No Tendon Exposed: No Muscle Exposed: No Joint Exposed: No Bone Exposed:  Wound Cleansing / Measurement X - Simple Wound Cleansing - one wound 1 5 []  - 0 Complex Wound Cleansing - multiple wounds X- 1 5 Wound Imaging (photographs - any number of wounds) []  - 0 Wound Tracing (instead of photographs) X- 1 5 Simple Wound Measurement - one wound []  - 0 Complex Wound Measurement - multiple wounds INTERVENTIONS - Wound Dressings []  - 0 Small Wound Dressing one or multiple wounds []  - 0 Medium Wound Dressing one or multiple wounds []  - 0 Large Wound Dressing one or multiple wounds []  - 0 Application of Medications - topical []  - 0 Application of Medications - injection INTERVENTIONS - Miscellaneous []  - 0 External ear exam []  - 0 Specimen Collection (cultures, biopsies, blood, body fluids, etc.) []  - 0 Specimen(s) / Culture(s) sent or taken to Lab for analysis []  - 0 Patient Transfer (multiple staff / Civil Service fast streamer / Similar devices) []  - 0 Simple Staple / Suture removal (25 or less) []  - 0 Complex Staple / Suture removal (26 or more) []  - 0 Hypo / Hyperglycemic Management (close monitor of Blood Glucose) []  - 0 Ankle / Brachial Index (ABI) - do not check if billed separately X- 1 5 Vital Signs Has the patient been seen at the hospital within the last three years: Yes Total Score: 75 Level Of Care: New/Established - Level 2 Electronic Signature(s) Signed: 10/05/2020 5:28:36 PM By: Carlene Coria RN Entered By: Carlene Coria on 10/05/2020 09:24:54 -------------------------------------------------------------------------------- Encounter Discharge Information Details Patient Name: Date of Service: Nathan Moll, [place] D. 10/05/2020 9:30 A M Medical Record Number: [id number] Patient Account Number: [id number] Date of Birth/Sex: Treating RN: [birth year] (69 y.o. [name] Primary Care Constance Whittle: York Pellant [place] Other Clinician: Referring Candido Flott: Treating Damaria Stofko/Extender: Kris Hartmann, [place] Weeks in Treatment: 66 Encounter Discharge Information Items Discharge Condition: Stable Ambulatory Status: Cane Discharge Destination: Home Transportation: Private Auto Accompanied By: wife Schedule Follow-up Appointment: No Clinical Summary of Care: Electronic Signature(s) Signed: 10/05/2020 4:58:51 PM By: Deon Pilling Entered By: Deon Pilling on 10/05/2020 10:15:10 -------------------------------------------------------------------------------- Lower Extremity Assessment Details Patient Name: Date of Service: Nathan Miller, [place] D. 10/05/2020 9:30 A M Medical Record Number: [id number] Patient Account Number: [id number] Date of Birth/Sex: Treating RN: Mar 05, 1951 (69 y.o. Ernestene Mention Primary Care Mckinsley Koelzer: Sharmaine Base, [place] Other Clinician: Referring Colbe Viviano: Treating Kdyn Vonbehren/Extender: Kris Hartmann, [place] Weeks in Treatment: 40 Edema Assessment Assessed: [Left: No] [Right: No] Edema: [Left: Ye] [Right: s] Calf Left: Right: Point of Measurement: From Medial Instep 40 cm Ankle Left: Right: Point of Measurement: From Medial Instep 28.5 cm Vascular Assessment Pulses: Dorsalis Pedis Palpable: [Right:Yes] Electronic Signature(s) Signed: 10/05/2020 5:33:51 PM By: Baruch Gouty RN, BSN Entered By: Baruch Gouty on 10/05/2020 09:14:57 -------------------------------------------------------------------------------- Multi Wound Chart Details Patient Name: Date of Service: Nathan Moll, [place] D. 10/05/2020 9:30 A M Medical Record Number: [id number] Patient Account Number: [id number] Date of Birth/Sex: Treating RN: [birth year] (68 y.o. Nathan Miller) Carlene Coria Primary Care Mika Griffitts: York Pellant [place] Other Clinician: Referring Aasir Daigler: Treating Sarahi Borland/Extender: Kris Hartmann, [place] Weeks in Treatment: 40 Vital Signs Height(in): 68 Capillary Blood Glucose(mg/dl): 111 Weight(lbs): 253 Pulse(bpm): 18 Body Mass Index(BMI): 56 Blood Pressure(mmHg): 181/109 Temperature(F): 97.8 Respiratory Rate(breaths/min): 18 Photos: [3:No Photos Right, Plantar Calcaneus] [N/A:N/A N/A] Wound Location: [3:Blister] [N/A:N/A] Wounding Event: [3:Diabetic Wound/Ulcer of the Lower] [N/A:N/A] Primary Etiology: [3:Extremity Coronary Artery Disease,] [N/A:N/A] Comorbid History: [3:Hypertension, Type II Diabetes, Osteoarthritis, Osteomyelitis, Neuropathy, Confinement Anxiety

## 2020-10-05 NOTE — Progress Notes (Signed)
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 Exercise regularly Off-Loading: Other: - float heels 1. The patient wound is healed 2. He has a Charcot foot prior extensive osteomyelitis and surgery. He has chronic swelling. He wears a specialized shoe and AFO brace on this side 3. For now there is nothing open and he can be discharged 4. I did not notice his blood pressure while he was in this clinic. I will have our staff contact him Electronic Signature(s) Signed: 10/05/2020 5:06:48 PM By: Linton Ham MD Entered By: Linton Ham on 10/05/2020 09:33:49 -------------------------------------------------------------------------------- SuperBill Details Patient Name: Date of Service: Nathan Miller, Nathan Miller. 10/05/2020 Medical Record Number: [id number] Patient Account Number: [id number] Date of Birth/Sex: Treating RN: 1951/05/22 (68 y.o. [name]) Epps, Morey Hummingbird Primary Care Provider: Sharmaine Base, [place] Other Clinician: Referring Provider: Treating Provider/Extender: Kris Hartmann, [place] Weeks in Treatment: 40 Diagnosis Coding ICD-10 Codes Code Description E11.621 Type 2 diabetes mellitus with foot ulcer 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 of other part of right foot with fat layer exposed E11.42 Type 2 diabetes mellitus with diabetic polyneuropathy M14.671 Charcot's joint, right ankle and foot Facility Procedures CPT4 Code: 40973532 Description: [phone] - WOUND CARE VISIT-LEV 2  EST PT Modifier: Quantity: 1 Physician Procedures : CPT4 Code Description Modifier [id number] - WC PHYS LEVEL 2 - EST PT ICD-10 Diagnosis Description E11.621 Type 2 diabetes mellitus with foot ulcer 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 of other part of right foot with fat layer exposed M14.671  Charcot's joint, right ankle and foot Quantity: 1 Electronic Signature(s) Signed: 10/05/2020 5:06:48 PM By: Linton Ham MD Entered By: Linton Ham on 10/05/2020 09:34:14  ICD-10 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 Exercise regularly Off-Loading: Other: - float heels 1. The patient wound is healed 2. He has a Charcot foot prior extensive osteomyelitis and surgery. He has chronic swelling. He wears a specialized shoe and AFO brace on this side 3. For now there is nothing open and he can be discharged 4. I did not notice his blood pressure while he was in this clinic. I will have our staff contact him Electronic Signature(s) Signed: 10/05/2020 5:06:48 PM By: Linton Ham MD Entered By: Linton Ham on 10/05/2020 09:33:49 -------------------------------------------------------------------------------- SuperBill Details Patient Name: Date of Service: Nathan Miller, Nathan Miller. 10/05/2020 Medical Record Number: [id number] Patient Account Number: [id number] Date of Birth/Sex: Treating RN: 1951/05/22 (68 y.o. [name]) Epps, Morey Hummingbird Primary Care Provider: Sharmaine Base, [place] Other Clinician: Referring Provider: Treating Provider/Extender: Kris Hartmann, [place] Weeks in Treatment: 40 Diagnosis Coding ICD-10 Codes Code Description E11.621 Type 2 diabetes mellitus with foot ulcer 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 of other part of right foot with fat layer exposed E11.42 Type 2 diabetes mellitus with diabetic polyneuropathy M14.671 Charcot's joint, right ankle and foot Facility Procedures CPT4 Code: [id number] Description: [phone] - WOUND CARE VISIT-LEV 2  EST PT Modifier: Quantity: 1 Physician Procedures : CPT4 Code Description Modifier [id number] - WC PHYS LEVEL 2 - EST PT ICD-10 Diagnosis Description E11.621 Type 2 diabetes mellitus with foot ulcer 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 of other part of right foot with fat layer exposed M14.671  Charcot's joint, right ankle and foot Quantity: 1 Electronic Signature(s) Signed: 10/05/2020 5:06:48 PM By: Linton Ham MD Entered By: Linton Ham on 10/05/2020 09:34:14  Nathan Miller, Nathan Miller ([id number]) Visit Report for 10/05/2020 HPI Details Patient Name: Date of Service: Nathan Miller, [place] Miller. 10/05/2020 9:30 A M Medical Record Number: [id number] Patient Account Number: [id number] Date of Birth/Sex: Treating RN: [birth year] (68 y.o. [name]) Carlene Coria Primary Care Provider: York Pellant [place] Other Clinician: Referring Provider: Treating Provider/Extender: Kris Hartmann, [place] Weeks in Treatment: 40 History of Present Illness Location: right foot Severity: Wag 3 Duration: #months Modifying Factors: Diabetic and charcot' foot.. Peripheral vascular disease ssociated Signs and Symptoms: osteo A HPI Description: see chief complaint. 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 This is been present for 5-6 years. He underwent a course of IV any biotics and hyperbaric oxygen roughly 5 years ago. He was offered an amputation by orthopedics and I believe at the time I concurred with this however the patient wishes to up go via wait-and-see approach. He is actually done fairly well over the 5 years is still ambulatory. He uses a silver alginate-based dressing on the foot which he changes himself up. 05/27/15; patient returns for his monthly visit the. The status of his foot and his wound are essentially unchanged this patient has a chronic idiopathic neuropathy. He has underlying osteomyelitis. He had extensive orthopedic surgery roughly 3 or 4 years ago had. He has underlying chronic osteomyelitis.[name] He dresses this with silver alginate. We order the supplies. I continue to see him on a monthly basis for wound debridement. 06/24/15 the patient arrives here today for his monthly wound care check. He has underlying chronic osteomyelitis. The wound was surgically debridement of nonviable circumferential tissue. He is continued with silver alginate based  stressing severe this area changed daily by his wife. Post debridement this actually looks as good as I've seen this since quite some time 07/29/15. The patient has been coming here monthly for many years. He has a chronic wound on his right lateral foot at roughly the mid shaft of his fifth metatarsal. Initially he underwent a code course of IV antibiotics hyperbaric oxygen, resection I believe of his fifth and fourth metatarsal for chronic osteomyelitis. 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 He was offered an amputation at some point but refused. He comes here for "palliative" wound care which requires extensive debridement of necrotic subcutaneous tissue to clean up the wound bed. He has been using silver alginate based dressings at home for many years. Although he has extensive edema of his foot, the situation has overall remained stable perhaps surprisingly so. 09/16/15; the patient has been coming here for many years. He is a chronic wound in his right lateral foot roughly the mid shaft of the fifth metatarsal. He is had previous surgery on this with resection. He clearly has underlying chronic osteomyelitis. An amputation was recommended years ago which she refused MRI to list the he has done exceptionally well with "palliative" wound dressings. He comes in to see me about every 4-6 weeks for debridement of nonviable tissue surface eschar and he has been using silver alginate based dressings. 10/14/15; the patient is here for his maintenance surgical debridement of the non-healing wound on the lateral aspect of his right foot. He has underlying osteomyelitis but is miraculously managed to hold on to this foot in spite of all odds. He completed aggressive management including surgery, hyperbarics perhaps 4 years ago. Unfortunately this never closed. There continues to be swelling of the foot  Morey Hummingbird Primary Care Provider: York Pellant [place] Other Clinician: Referring Provider: Treating Provider/Extender:  Kris Hartmann, [place] Weeks in Treatment: 65 Constitutional Patient is hypertensive.. Pulse regular and within target range for patient.[name] Respirations regular, non-labored and within target range.. Temperature is normal and within the target range for the patient.[name] Appears in no distress. Notes Wound exam; lateral part of the right calcaneus. Eschar here which I gently removed there is no open wound underneath. The foot is swollen this is chronic I think this is because of Charcot deformity as well as prior extensive osteomyelitis surgery and resultant localized lymphedema Electronic Signature(s) Signed: 10/05/2020 5:06:48 PM By: Linton Ham MD Entered By: Linton Ham on 10/05/2020 09:32:22 -------------------------------------------------------------------------------- Physician Orders Details Patient Name: Date of Service: Nathan Miller, [place] Miller. 10/05/2020 9:30 A M Medical Record Number: [id number] Patient Account Number: [id number] Date of Birth/Sex: Treating RN: August 03, 1951 (68 y.o. [name]) Carlene Coria Primary Care Provider: York Pellant [place] Other Clinician: Referring Provider: Treating Provider/Extender: Kris Hartmann, [place] Weeks in Treatment: 41 Verbal / Phone Orders: No Diagnosis Coding ICD-10 Coding Code Description E11.621 Type 2 diabetes mellitus with foot ulcer 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 of other part of right foot with fat layer exposed E11.42 Type 2 diabetes mellitus with diabetic polyneuropathy M14.671 Charcot's joint, right ankle and foot Discharge From [facility] Services Discharge from [place] Edema Control - Lymphedema / SCD / Other Elevate legs to the level of the heart or above for 30 minutes daily and/or when sitting, a frequency of: Avoid standing for long periods of time. Exercise regularly Off-Loading Other: - float heels Wound Treatment Electronic Signature(s) Signed: 10/05/2020 5:06:48 PM By:  Linton Ham MD Signed: 10/05/2020 5:28:36 PM By: Carlene Coria RN Entered By: Carlene Coria on 10/05/2020 09:23:49 -------------------------------------------------------------------------------- Problem List Details Patient Name: Date of Service: Nathan Moll, [place] Miller. 10/05/2020 9:30 A M Medical Record Number: [id number] Patient Account Number: [id number] Date of Birth/Sex: Treating RN: 16-Apr-1951 (68 y.o. [name]) Carlene Coria Primary Care Provider: York Pellant [place] Other Clinician: Referring Provider: Treating Provider/Extender: Kris Hartmann, [place] Weeks in Treatment: 40 Active Problems ICD-10 Encounter Code Description Active Date MDM Diagnosis E11.621 Type 2 diabetes mellitus with foot ulcer 12/29/2019 No Yes 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 of other part of right foot with fat layer exposed 12/29/2019 No Yes E11.42 Type 2 diabetes mellitus with diabetic polyneuropathy 12/29/2019 No Yes M14.671 Charcot's joint, right ankle and foot 12/29/2019 No Yes Inactive Problems Resolved Problems Electronic Signature(s) Signed: 10/05/2020 5:06:48 PM By: Linton Ham MD Entered By: Linton Ham on 10/05/2020 09:28:18 -------------------------------------------------------------------------------- Progress Note Details Patient Name: Date of Service: Nathan Miller, [place] UL Miller. 10/05/2020 9:30 A M Medical Record Number: [id number] Patient Account Number: [id number] Date of Birth/Sex: Treating RN: July 04, 1951 (68 y.o. [name]) Carlene Coria Primary Care Provider: Other Clinician: Mackie Pai NE Referring Provider: Treating Provider/Extender: Kris Hartmann, [place] Weeks in Treatment: 40 Subjective History of Present Illness (HPI) The following HPI elements were documented for the patient's wound: Location: right foot Severity: Wag 3 Duration: #months Modifying Factors: Diabetic and charcot' foot.. Peripheral vascular disease Associated Signs  and Symptoms: osteo see chief complaint. 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 This is been present for 5-6 years. He underwent a course of IV any biotics and hyperbaric oxygen roughly 5 years ago. He was  Nathan Miller, Nathan Miller ([id number]) Visit Report for 10/05/2020 HPI Details Patient Name: Date of Service: Nathan Miller, [place] Miller. 10/05/2020 9:30 A M Medical Record Number: [id number] Patient Account Number: [id number] Date of Birth/Sex: Treating RN: [birth year] (68 y.o. [name]) Carlene Coria Primary Care Provider: York Pellant [place] Other Clinician: Referring Provider: Treating Provider/Extender: Kris Hartmann, [place] Weeks in Treatment: 40 History of Present Illness Location: right foot Severity: Wag 3 Duration: #months Modifying Factors: Diabetic and charcot' foot.. Peripheral vascular disease ssociated Signs and Symptoms: osteo A HPI Description: see chief complaint. 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 This is been present for 5-6 years. He underwent a course of IV any biotics and hyperbaric oxygen roughly 5 years ago. He was offered an amputation by orthopedics and I believe at the time I concurred with this however the patient wishes to up go via wait-and-see approach. He is actually done fairly well over the 5 years is still ambulatory. He uses a silver alginate-based dressing on the foot which he changes himself up. 05/27/15; patient returns for his monthly visit the. The status of his foot and his wound are essentially unchanged this patient has a chronic idiopathic neuropathy. He has underlying osteomyelitis. He had extensive orthopedic surgery roughly 3 or 4 years ago had. He has underlying chronic osteomyelitis.[name] He dresses this with silver alginate. We order the supplies. I continue to see him on a monthly basis for wound debridement. 06/24/15 the patient arrives here today for his monthly wound care check. He has underlying chronic osteomyelitis. The wound was surgically debridement of nonviable circumferential tissue. He is continued with silver alginate based  stressing severe this area changed daily by his wife. Post debridement this actually looks as good as I've seen this since quite some time 07/29/15. The patient has been coming here monthly for many years. He has a chronic wound on his right lateral foot at roughly the mid shaft of his fifth metatarsal. Initially he underwent a code course of IV antibiotics hyperbaric oxygen, resection I believe of his fifth and fourth metatarsal for chronic osteomyelitis. 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 He was offered an amputation at some point but refused. He comes here for "palliative" wound care which requires extensive debridement of necrotic subcutaneous tissue to clean up the wound bed. He has been using silver alginate based dressings at home for many years. Although he has extensive edema of his foot, the situation has overall remained stable perhaps surprisingly so. 09/16/15; the patient has been coming here for many years. He is a chronic wound in his right lateral foot roughly the mid shaft of the fifth metatarsal. He is had previous surgery on this with resection. He clearly has underlying chronic osteomyelitis. An amputation was recommended years ago which she refused MRI to list the he has done exceptionally well with "palliative" wound dressings. He comes in to see me about every 4-6 weeks for debridement of nonviable tissue surface eschar and he has been using silver alginate based dressings. 10/14/15; the patient is here for his maintenance surgical debridement of the non-healing wound on the lateral aspect of his right foot. He has underlying osteomyelitis but is miraculously managed to hold on to this foot in spite of all odds. He completed aggressive management including surgery, hyperbarics perhaps 4 years ago. Unfortunately this never closed. There continues to be swelling of the foot  in our clinic was 1.0 on the right 3/22; areas on the right lateral heel not any better than last time. He has some undermining medially. This is undoubtedly a weightbearing surface here. This will not be easy to heal. 3/29; this is a patient with type 2 diabetes and her Charcot foot. He has an area on the plantar heel on the right. We have been using silver alginate. He is his own special boot with a brace. 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 Areas on the plantar heel on the right. Improvement in depth. We have been using silver alginate 4/26 Charcot foot. Right plantar heel lateral aspect. No real improvement today. We have been using silver alginate because of drainage. This is the reason he does not want a total contact cast he says the dressing simply needs to be changed daily. He has a new custom shoe with an AFO brace on the right. He states the wound started in his old shoes 5/18; Charcot foot previous foot surgery and probably some degree of localized lymphedema in the right lateral foot. He has specialized shoes with an AFO on this side. There is not an option to offload this wound which is on the right plantar heel lateral aspect. We have been using silver alginate. The wound is somewhat smaller. He does not have an arterial issue 6/8; Charcot foot previous surgery probably some degree of localized lymphedema in the right lateral foot. He has specialized shoes with an AFO brace. Wound is on the right lateral calcaneus. We have been using silver alginate. Dimensions are smaller. 7/6; monthly follow-up. Charcot foot deformity with localized lymphedema in the right lateral foot from previous surgery. He wears specially shoes with an AFO. Wound is on the right lateral calcaneus. Some improvement in the  dimensions 8/10-Patient returns at monthly follow-up, the wound on the right calcaneus does appear to be worse with some undermining on all sides. We have been using silver alginate and offloading with AFO. 9/21; this is a patient with a wound on the right calcaneus in the setting of Charcot foot and lymphedema from previous foot surgery. He has a wound on the plantar aspect of his heel laterally. I think this is a really an offloading issue more than anything else. Nevertheless the last time he was here he had some purulent drainage there was a culture done which showed a Citrobacter species. This was missed in our clinic and only came to my attention on 9/13. I sent in a prescription for ciprofloxacin to his [place] however for some reason [facility] is backordered on ciprofloxacin. Therefore he is never received it. He didn't call us nor did [facility] call us or fax [place] for that matter. He is using silver alginate to the wound which I think is better in terms of surface area 11/2; wound on the right lateral calcaneus in the setting of chronic Charcot foot and lymphedema from previous foot surgery. His wound is on the lateral part of his left heel 12/7; right lateral calcaneus the wound is closed. There is still eschar here largely result of offloading difficulties. Still swelling in the foot as result of Charcot deformity previous osteomyelitis Electronic Signature(s) Signed: 10/05/2020 5:06:48 PM By: Linton Ham MD Entered By: Linton Ham on 10/05/2020 09:31:17 -------------------------------------------------------------------------------- Physical Exam Details Patient Name: Date of Service: Nathan Moll, [place] Miller. 10/05/2020 9:30 A M Medical Record Number: [id number] Patient Account Number: [id number] Date of Birth/Sex: Treating RN: [birth year] (69 y.o. M) Epps,  Nathan Miller, Nathan Miller ([id number]) Visit Report for 10/05/2020 HPI Details Patient Name: Date of Service: Nathan Miller, [place] Miller. 10/05/2020 9:30 A M Medical Record Number: [id number] Patient Account Number: [id number] Date of Birth/Sex: Treating RN: [birth year] (68 y.o. [name]) Carlene Coria Primary Care Provider: York Pellant [place] Other Clinician: Referring Provider: Treating Provider/Extender: Kris Hartmann, [place] Weeks in Treatment: 40 History of Present Illness Location: right foot Severity: Wag 3 Duration: #months Modifying Factors: Diabetic and charcot' foot.. Peripheral vascular disease ssociated Signs and Symptoms: osteo A HPI Description: see chief complaint. 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 This is been present for 5-6 years. He underwent a course of IV any biotics and hyperbaric oxygen roughly 5 years ago. He was offered an amputation by orthopedics and I believe at the time I concurred with this however the patient wishes to up go via wait-and-see approach. He is actually done fairly well over the 5 years is still ambulatory. He uses a silver alginate-based dressing on the foot which he changes himself up. 05/27/15; patient returns for his monthly visit the. The status of his foot and his wound are essentially unchanged this patient has a chronic idiopathic neuropathy. He has underlying osteomyelitis. He had extensive orthopedic surgery roughly 3 or 4 years ago had. He has underlying chronic osteomyelitis.[name] He dresses this with silver alginate. We order the supplies. I continue to see him on a monthly basis for wound debridement. 06/24/15 the patient arrives here today for his monthly wound care check. He has underlying chronic osteomyelitis. The wound was surgically debridement of nonviable circumferential tissue. He is continued with silver alginate based  stressing severe this area changed daily by his wife. Post debridement this actually looks as good as I've seen this since quite some time 07/29/15. The patient has been coming here monthly for many years. He has a chronic wound on his right lateral foot at roughly the mid shaft of his fifth metatarsal. Initially he underwent a code course of IV antibiotics hyperbaric oxygen, resection I believe of his fifth and fourth metatarsal for chronic osteomyelitis. 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 He was offered an amputation at some point but refused. He comes here for "palliative" wound care which requires extensive debridement of necrotic subcutaneous tissue to clean up the wound bed. He has been using silver alginate based dressings at home for many years. Although he has extensive edema of his foot, the situation has overall remained stable perhaps surprisingly so. 09/16/15; the patient has been coming here for many years. He is a chronic wound in his right lateral foot roughly the mid shaft of the fifth metatarsal. He is had previous surgery on this with resection. He clearly has underlying chronic osteomyelitis. An amputation was recommended years ago which she refused MRI to list the he has done exceptionally well with "palliative" wound dressings. He comes in to see me about every 4-6 weeks for debridement of nonviable tissue surface eschar and he has been using silver alginate based dressings. 10/14/15; the patient is here for his maintenance surgical debridement of the non-healing wound on the lateral aspect of his right foot. He has underlying osteomyelitis but is miraculously managed to hold on to this foot in spite of all odds. He completed aggressive management including surgery, hyperbarics perhaps 4 years ago. Unfortunately this never closed. There continues to be swelling of the foot  Nathan Miller, Nathan Miller ([id number]) Visit Report for 10/05/2020 HPI Details Patient Name: Date of Service: Nathan Miller, [place] Miller. 10/05/2020 9:30 A M Medical Record Number: [id number] Patient Account Number: [id number] Date of Birth/Sex: Treating RN: [birth year] (68 y.o. [name]) Carlene Coria Primary Care Provider: York Pellant RNERSTO NE Other Clinician: Referring Provider: Treating Provider/Extender: Kris Hartmann, [place] NE Weeks in Treatment: 40 History of Present Illness Location: right foot Severity: Wag 3 Duration: #months Modifying Factors: Diabetic and charcot' foot.. Peripheral vascular disease ssociated Signs and Symptoms: osteo A HPI Description: see chief complaint. 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 This is been present for 5-6 years. He underwent a course of IV any biotics and hyperbaric oxygen roughly 5 years ago. He was offered an amputation by orthopedics and I believe at the time I concurred with this however the patient wishes to up go via wait-and-see approach. He is actually done fairly well over the 5 years is still ambulatory. He uses a silver alginate-based dressing on the foot which he changes himself up. 05/27/15; patient returns for his monthly visit the. The status of his foot and his wound are essentially unchanged this patient has a chronic idiopathic neuropathy. He has underlying osteomyelitis. He had extensive orthopedic surgery roughly 3 or 4 years ago had. He has underlying chronic osteomyelitis.[name] He dresses this with silver alginate. We order the supplies. I continue to see him on a monthly basis for wound debridement. 06/24/15 the patient arrives here today for his monthly wound care check. He has underlying chronic osteomyelitis. The wound was surgically debridement of nonviable circumferential tissue. He is continued with silver alginate based  stressing severe this area changed daily by his wife. Post debridement this actually looks as good as I've seen this since quite some time 07/29/15. The patient has been coming here monthly for many years. He has a chronic wound on his right lateral foot at roughly the mid shaft of his fifth metatarsal. Initially he underwent a code course of IV antibiotics hyperbaric oxygen, resection I believe of his fifth and fourth metatarsal for chronic osteomyelitis. 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 He was offered an amputation at some point but refused. He comes here for "palliative" wound care which requires extensive debridement of necrotic subcutaneous tissue to clean up the wound bed. He has been using silver alginate based dressings at home for many years. Although he has extensive edema of his foot, the situation has overall remained stable perhaps surprisingly so. 09/16/15; the patient has been coming here for many years. He is a chronic wound in his right lateral foot roughly the mid shaft of the fifth metatarsal. He is had previous surgery on this with resection. He clearly has underlying chronic osteomyelitis. An amputation was recommended years ago which she refused MRI to list the he has done exceptionally well with "palliative" wound dressings. He comes in to see me about every 4-6 weeks for debridement of nonviable tissue surface eschar and he has been using silver alginate based dressings. 10/14/15; the patient is here for his maintenance surgical debridement of the non-healing wound on the lateral aspect of his right foot. He has underlying osteomyelitis but is miraculously managed to hold on to this foot in spite of all odds. He completed aggressive management including surgery, hyperbarics perhaps 4 years ago. Unfortunately this never closed. There continues to be swelling of the foot  in our clinic was 1.0 on the right 3/22; areas on the right lateral heel not any better than last time. He has some undermining medially. This is undoubtedly a weightbearing surface here. This will not be easy to heal. 3/29; this is a patient with type 2 diabetes and her Charcot foot. He has an area on the plantar heel on the right. We have been using silver alginate. He is his own special boot with a brace. 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 Areas on the plantar heel on the right. Improvement in depth. We have been using silver alginate 4/26 Charcot foot. Right plantar heel lateral aspect. No real improvement today. We have been using silver alginate because of drainage. This is the reason he does not want a total contact cast he says the dressing simply needs to be changed daily. He has a new custom shoe with an AFO brace on the right. He states the wound started in his old shoes 5/18; Charcot foot previous foot surgery and probably some degree of localized lymphedema in the right lateral foot. He has specialized shoes with an AFO on this side. There is not an option to offload this wound which is on the right plantar heel lateral aspect. We have been using silver alginate. The wound is somewhat smaller. He does not have an arterial issue 6/8; Charcot foot previous surgery probably some degree of localized lymphedema in the right lateral foot. He has specialized shoes with an AFO brace. Wound is on the right lateral calcaneus. We have been using silver alginate. Dimensions are smaller. 7/6; monthly follow-up. Charcot foot deformity with localized lymphedema in the right lateral foot from previous surgery. He wears specially shoes with an AFO. Wound is on the right lateral calcaneus. Some improvement in the  dimensions 8/10-Patient returns at monthly follow-up, the wound on the right calcaneus does appear to be worse with some undermining on all sides. We have been using silver alginate and offloading with AFO. 9/21; this is a patient with a wound on the right calcaneus in the setting of Charcot foot and lymphedema from previous foot surgery. He has a wound on the plantar aspect of his heel laterally. I think this is a really an offloading issue more than anything else. Nevertheless the last time he was here he had some purulent drainage there was a culture done which showed a Citrobacter species. This was missed in our clinic and only came to my attention on 9/13. I sent in a prescription for ciprofloxacin to his [place] however for some reason [facility] is backordered on ciprofloxacin. Therefore he is never received it. He didn't call us nor did [facility] call us or fax [place] for that matter. He is using silver alginate to the wound which I think is better in terms of surface area 11/2; wound on the right lateral calcaneus in the setting of chronic Charcot foot and lymphedema from previous foot surgery. His wound is on the lateral part of his left heel 12/7; right lateral calcaneus the wound is closed. There is still eschar here largely result of offloading difficulties. Still swelling in the foot as result of Charcot deformity previous osteomyelitis Electronic Signature(s) Signed: 10/05/2020 5:06:48 PM By: Linton Ham MD Entered By: Linton Ham on 10/05/2020 09:31:17 -------------------------------------------------------------------------------- Physical Exam Details Patient Name: Date of Service: Nathan Moll, [place] Miller. 10/05/2020 9:30 A M Medical Record Number: [id number] Patient Account Number: [id number] Date of Birth/Sex: Treating RN: [birth year] (69 y.o. M) Epps,  Morey Hummingbird Primary Care Provider: York Pellant RNERSTO NE Other Clinician: Referring Provider: Treating Provider/Extender:  Kris Hartmann, [place] NE Weeks in Treatment: 65 Constitutional Patient is hypertensive.. Pulse regular and within target range for patient.[name] Respirations regular, non-labored and within target range.. Temperature is normal and within the target range for the patient.[name] Appears in no distress. Notes Wound exam; lateral part of the right calcaneus. Eschar here which I gently removed there is no open wound underneath. The foot is swollen this is chronic I think this is because of Charcot deformity as well as prior extensive osteomyelitis surgery and resultant localized lymphedema Electronic Signature(s) Signed: 10/05/2020 5:06:48 PM By: Linton Ham MD Entered By: Linton Ham on 10/05/2020 09:32:22 -------------------------------------------------------------------------------- Physician Orders Details Patient Name: Date of Service: Nathan Miller, [place] Miller. 10/05/2020 9:30 A M Medical Record Number: [id number] Patient Account Number: [id number] Date of Birth/Sex: Treating RN: August 03, 1951 (68 y.o. [name]) Carlene Coria Primary Care Provider: York Pellant R[place] NE Other Clinician: Referring Provider: Treating Provider/Extender: Kris Hartmann, [place] Weeks in Treatment: 41 Verbal / Phone Orders: No Diagnosis Coding ICD-10 Coding Code Description E11.621 Type 2 diabetes mellitus with foot ulcer 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 of other part of right foot with fat layer exposed E11.42 Type 2 diabetes mellitus with diabetic polyneuropathy M14.671 Charcot's joint, right ankle and foot Discharge From [facility] Services Discharge from [place] Edema Control - Lymphedema / SCD / Other Elevate legs to the level of the heart or above for 30 minutes daily and/or when sitting, a frequency of: Avoid standing for long periods of time. Exercise regularly Off-Loading Other: - float heels Wound Treatment Electronic Signature(s) Signed: 10/05/2020 5:06:48 PM By:  Linton Ham MD Signed: 10/05/2020 5:28:36 PM By: Carlene Coria RN Entered By: Carlene Coria on 10/05/2020 09:23:49 -------------------------------------------------------------------------------- Problem List Details Patient Name: Date of Service: Nathan Moll, [place] Miller. 10/05/2020 9:30 A M Medical Record Number: [id number] Patient Account Number: [id number] Date of Birth/Sex: Treating RN: 16-Apr-1951 (68 y.o. [name]) Carlene Coria Primary Care Provider: York Pellant RNERST[place] NE Other Clinician: Referring Provider: Treating Provider/Extender: Kris Hartmann, [place] NE Weeks in Treatment: 40 Active Problems ICD-10 Encounter Code Description Active Date MDM Diagnosis E11.621 Type 2 diabetes mellitus with foot ulcer 12/29/2019 No Yes 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 of other part of right foot with fat layer exposed 12/29/2019 No Yes E11.42 Type 2 diabetes mellitus with diabetic polyneuropathy 12/29/2019 No Yes M14.671 Charcot's joint, right ankle and foot 12/29/2019 No Yes Inactive Problems Resolved Problems Electronic Signature(s) Signed: 10/05/2020 5:06:48 PM By: Linton Ham MD Entered By: Linton Ham on 10/05/2020 09:28:18 -------------------------------------------------------------------------------- Progress Note Details Patient Name: Date of Service: Nathan Miller, [place] UL Miller. 10/05/2020 9:30 A M Medical Record Number: [id number] Patient Account Number: [id number] Date of Birth/Sex: Treating RN: July 04, 1951 (68 y.o. [name]) Carlene Coria Primary Care Provider: Other Clinician: Mackie Pai NE Referring Provider: Treating Provider/Extender: Kris Hartmann, [place] NE Weeks in Treatment: 40 Subjective History of Present Illness (HPI) The following HPI elements were documented for the patient's wound: Location: right foot Severity: Wag 3 Duration: #months Modifying Factors: Diabetic and charcot' foot.. Peripheral vascular disease Associated Signs  and Symptoms: osteo see chief complaint. 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 This is been present for 5-6 years. He underwent a course of IV any biotics and hyperbaric oxygen roughly 5 years ago. He was  Nathan Miller, Nathan Miller ([id number]) Visit Report for 10/05/2020 HPI Details Patient Name: Date of Service: Nathan Miller, [place] Miller. 10/05/2020 9:30 A M Medical Record Number: [id number] Patient Account Number: [id number] Date of Birth/Sex: Treating RN: [birth year] (68 y.o. [name]) Carlene Coria Primary Care Provider: York Pellant R[place] NE Other Clinician: Referring Provider: Treating Provider/Extender: Kris Hartmann, [place] NE Weeks in Treatment: 40 History of Present Illness Location: right foot Severity: Wag 3 Duration: #months Modifying Factors: Diabetic and charcot' foot.. Peripheral vascular disease ssociated Signs and Symptoms: osteo A HPI Description: see chief complaint. 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 This is been present for 5-6 years. He underwent a course of IV any biotics and hyperbaric oxygen roughly 5 years ago. He was offered an amputation by orthopedics and I believe at the time I concurred with this however the patient wishes to up go via wait-and-see approach. He is actually done fairly well over the 5 years is still ambulatory. He uses a silver alginate-based dressing on the foot which he changes himself up. 05/27/15; patient returns for his monthly visit the. The status of his foot and his wound are essentially unchanged this patient has a chronic idiopathic neuropathy. He has underlying osteomyelitis. He had extensive orthopedic surgery roughly 3 or 4 years ago had. He has underlying chronic osteomyelitis.[name] He dresses this with silver alginate. We order the supplies. I continue to see him on a monthly basis for wound debridement. 06/24/15 the patient arrives here today for his monthly wound care check. He has underlying chronic osteomyelitis. The wound was surgically debridement of nonviable circumferential tissue. He is continued with silver alginate based  stressing severe this area changed daily by his wife. Post debridement this actually looks as good as I've seen this since quite some time 07/29/15. The patient has been coming here monthly for many years. He has a chronic wound on his right lateral foot at roughly the mid shaft of his fifth metatarsal. Initially he underwent a code course of IV antibiotics hyperbaric oxygen, resection I believe of his fifth and fourth metatarsal for chronic osteomyelitis. 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 He was offered an amputation at some point but refused. He comes here for "palliative" wound care which requires extensive debridement of necrotic subcutaneous tissue to clean up the wound bed. He has been using silver alginate based dressings at home for many years. Although he has extensive edema of his foot, the situation has overall remained stable perhaps surprisingly so. 09/16/15; the patient has been coming here for many years. He is a chronic wound in his right lateral foot roughly the mid shaft of the fifth metatarsal. He is had previous surgery on this with resection. He clearly has underlying chronic osteomyelitis. An amputation was recommended years ago which she refused MRI to list the he has done exceptionally well with "palliative" wound dressings. He comes in to see me about every 4-6 weeks for debridement of nonviable tissue surface eschar and he has been using silver alginate based dressings. 10/14/15; the patient is here for his maintenance surgical debridement of the non-healing wound on the lateral aspect of his right foot. He has underlying osteomyelitis but is miraculously managed to hold on to this foot in spite of all odds. He completed aggressive management including surgery, hyperbarics perhaps 4 years ago. Unfortunately this never closed. There continues to be swelling of the foot  Morey Hummingbird Primary Care Provider: York Pellant R[place] NE Other Clinician: Referring Provider: Treating Provider/Extender:  Kris Hartmann, [place] NE Weeks in Treatment: 65 Constitutional Patient is hypertensive.. Pulse regular and within target range for patient.[name] Respirations regular, non-labored and within target range.. Temperature is normal and within the target range for the patient.[name] Appears in no distress. Notes Wound exam; lateral part of the right calcaneus. Eschar here which I gently removed there is no open wound underneath. The foot is swollen this is chronic I think this is because of Charcot deformity as well as prior extensive osteomyelitis surgery and resultant localized lymphedema Electronic Signature(s) Signed: 10/05/2020 5:06:48 PM By: Linton Ham MD Entered By: Linton Ham on 10/05/2020 09:32:22 -------------------------------------------------------------------------------- Physician Orders Details Patient Name: Date of Service: Nathan Miller, [place] Miller. 10/05/2020 9:30 A M Medical Record Number: [id number] Patient Account Number: [id number] Date of Birth/Sex: Treating RN: August 03, 1951 (68 y.o. [name]) Carlene Coria Primary Care Provider: York Pellant R[place] NE Other Clinician: Referring Provider: Treating Provider/Extender: Kris Hartmann, [place] Weeks in Treatment: 41 Verbal / Phone Orders: No Diagnosis Coding ICD-10 Coding Code Description E11.621 Type 2 diabetes mellitus with foot ulcer 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 of other part of right foot with fat layer exposed E11.42 Type 2 diabetes mellitus with diabetic polyneuropathy M14.671 Charcot's joint, right ankle and foot Discharge From [facility] Services Discharge from [place] Edema Control - Lymphedema / SCD / Other Elevate legs to the level of the heart or above for 30 minutes daily and/or when sitting, a frequency of: Avoid standing for long periods of time. Exercise regularly Off-Loading Other: - float heels Wound Treatment Electronic Signature(s) Signed: 10/05/2020 5:06:48 PM By:  Linton Ham MD Signed: 10/05/2020 5:28:36 PM By: Carlene Coria RN Entered By: Carlene Coria on 10/05/2020 09:23:49 -------------------------------------------------------------------------------- Problem List Details Patient Name: Date of Service: Nathan Moll, [place] Miller. 10/05/2020 9:30 A M Medical Record Number: [id number] Patient Account Number: [id number] Date of Birth/Sex: Treating RN: 16-Apr-1951 (68 y.o. [name]) Carlene Coria Primary Care Provider: York Pellant R[place] NE Other Clinician: Referring Provider: Treating Provider/Extender: Kris Hartmann, [place] NE Weeks in Treatment: 40 Active Problems ICD-10 Encounter Code Description Active Date MDM Diagnosis E11.621 Type 2 diabetes mellitus with foot ulcer 12/29/2019 No Yes 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 of other part of right foot with fat layer exposed 12/29/2019 No Yes E11.42 Type 2 diabetes mellitus with diabetic polyneuropathy 12/29/2019 No Yes M14.671 Charcot's joint, right ankle and foot 12/29/2019 No Yes Inactive Problems Resolved Problems Electronic Signature(s) Signed: 10/05/2020 5:06:48 PM By: Linton Ham MD Entered By: Linton Ham on 10/05/2020 09:28:18 -------------------------------------------------------------------------------- Progress Note Details Patient Name: Date of Service: Nathan Miller, [place] UL Miller. 10/05/2020 9:30 A M Medical Record Number: [id number] Patient Account Number: [id number] Date of Birth/Sex: Treating RN: July 04, 1951 (68 y.o. [name]) Carlene Coria Primary Care Provider: Other Clinician: Mackie Pai NE Referring Provider: Treating Provider/Extender: Kris Hartmann, [place] NE Weeks in Treatment: 40 Subjective History of Present Illness (HPI) The following HPI elements were documented for the patient's wound: Location: right foot Severity: Wag 3 Duration: #months Modifying Factors: Diabetic and charcot' foot.. Peripheral vascular disease Associated Signs  and Symptoms: osteo see chief complaint. 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 This is been present for 5-6 years. He underwent a course of IV any biotics and hyperbaric oxygen roughly 5 years ago. He was  Nathan Miller, Nathan Miller ([id number]) Visit Report for 10/05/2020 HPI Details Patient Name: Date of Service: Nathan Miller, [place] Miller. 10/05/2020 9:30 A M Medical Record Number: [id number] Patient Account Number: [id number] Date of Birth/Sex: Treating RN: [birth year] (68 y.o. [name]) Carlene Coria Primary Care Provider: York Pellant RNERSTO NE Other Clinician: Referring Provider: Treating Provider/Extender: Kris Hartmann, [place] NE Weeks in Treatment: 40 History of Present Illness Location: right foot Severity: Wag 3 Duration: #months Modifying Factors: Diabetic and charcot' foot.. Peripheral vascular disease ssociated Signs and Symptoms: osteo A HPI Description: see chief complaint. 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 This is been present for 5-6 years. He underwent a course of IV any biotics and hyperbaric oxygen roughly 5 years ago. He was offered an amputation by orthopedics and I believe at the time I concurred with this however the patient wishes to up go via wait-and-see approach. He is actually done fairly well over the 5 years is still ambulatory. He uses a silver alginate-based dressing on the foot which he changes himself up. 05/27/15; patient returns for his monthly visit the. The status of his foot and his wound are essentially unchanged this patient has a chronic idiopathic neuropathy. He has underlying osteomyelitis. He had extensive orthopedic surgery roughly 3 or 4 years ago had. He has underlying chronic osteomyelitis.[name] He dresses this with silver alginate. We order the supplies. I continue to see him on a monthly basis for wound debridement. 06/24/15 the patient arrives here today for his monthly wound care check. He has underlying chronic osteomyelitis. The wound was surgically debridement of nonviable circumferential tissue. He is continued with silver alginate based  stressing severe this area changed daily by his wife. Post debridement this actually looks as good as I've seen this since quite some time 07/29/15. The patient has been coming here monthly for many years. He has a chronic wound on his right lateral foot at roughly the mid shaft of his fifth metatarsal. Initially he underwent a code course of IV antibiotics hyperbaric oxygen, resection I believe of his fifth and fourth metatarsal for chronic osteomyelitis. 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 He was offered an amputation at some point but refused. He comes here for "palliative" wound care which requires extensive debridement of necrotic subcutaneous tissue to clean up the wound bed. He has been using silver alginate based dressings at home for many years. Although he has extensive edema of his foot, the situation has overall remained stable perhaps surprisingly so. 09/16/15; the patient has been coming here for many years. He is a chronic wound in his right lateral foot roughly the mid shaft of the fifth metatarsal. He is had previous surgery on this with resection. He clearly has underlying chronic osteomyelitis. An amputation was recommended years ago which she refused MRI to list the he has done exceptionally well with "palliative" wound dressings. He comes in to see me about every 4-6 weeks for debridement of nonviable tissue surface eschar and he has been using silver alginate based dressings. 10/14/15; the patient is here for his maintenance surgical debridement of the non-healing wound on the lateral aspect of his right foot. He has underlying osteomyelitis but is miraculously managed to hold on to this foot in spite of all odds. He completed aggressive management including surgery, hyperbarics perhaps 4 years ago. Unfortunately this never closed. There continues to be swelling of the foot  Morey Hummingbird Primary Care Provider: York Pellant RNERSTO NE Other Clinician: Referring Provider: Treating Provider/Extender:  Kris Hartmann, [place] NE Weeks in Treatment: 65 Constitutional Patient is hypertensive.. Pulse regular and within target range for patient.[name] Respirations regular, non-labored and within target range.. Temperature is normal and within the target range for the patient.[name] Appears in no distress. Notes Wound exam; lateral part of the right calcaneus. Eschar here which I gently removed there is no open wound underneath. The foot is swollen this is chronic I think this is because of Charcot deformity as well as prior extensive osteomyelitis surgery and resultant localized lymphedema Electronic Signature(s) Signed: 10/05/2020 5:06:48 PM By: Linton Ham MD Entered By: Linton Ham on 10/05/2020 09:32:22 -------------------------------------------------------------------------------- Physician Orders Details Patient Name: Date of Service: Nathan Miller, [place] UL Miller. 10/05/2020 9:30 A M Medical Record Number: [id number] Patient Account Number: [id number] Date of Birth/Sex: Treating RN: August 03, 1951 (68 y.o. [name]) Carlene Coria Primary Care Provider: York Pellant RNERST[place] NE Other Clinician: Referring Provider: Treating Provider/Extender: Kris Hartmann, [place] NE Weeks in Treatment: 41 Verbal / Phone Orders: No Diagnosis Coding ICD-10 Coding Code Description E11.621 Type 2 diabetes mellitus with foot ulcer 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 of other part of right foot with fat layer exposed E11.42 Type 2 diabetes mellitus with diabetic polyneuropathy M14.671 Charcot's joint, right ankle and foot Discharge From [facility] Services Discharge from [place] Edema Control - Lymphedema / SCD / Other Elevate legs to the level of the heart or above for 30 minutes daily and/or when sitting, a frequency of: Avoid standing for long periods of time. Exercise regularly Off-Loading Other: - float heels Wound Treatment Electronic Signature(s) Signed: 10/05/2020 5:06:48 PM By:  Linton Ham MD Signed: 10/05/2020 5:28:36 PM By: Carlene Coria RN Entered By: Carlene Coria on 10/05/2020 09:23:49 -------------------------------------------------------------------------------- Problem List Details Patient Name: Date of Service: Nathan Moll, [place] UL Miller. 10/05/2020 9:30 A M Medical Record Number: [id number] Patient Account Number: [id number] Date of Birth/Sex: Treating RN: 16-Apr-1951 (68 y.o. [name]) Carlene Coria Primary Care Provider: York Pellant R[place] NE Other Clinician: Referring Provider: Treating Provider/Extender: Kris Hartmann, [place] NE Weeks in Treatment: 40 Active Problems ICD-10 Encounter Code Description Active Date MDM Diagnosis E11.621 Type 2 diabetes mellitus with foot ulcer 12/29/2019 No Yes 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 of other part of right foot with fat layer exposed 12/29/2019 No Yes E11.42 Type 2 diabetes mellitus with diabetic polyneuropathy 12/29/2019 No Yes M14.671 Charcot's joint, right ankle and foot 12/29/2019 No Yes Inactive Problems Resolved Problems Electronic Signature(s) Signed: 10/05/2020 5:06:48 PM By: Linton Ham MD Entered By: Linton Ham on 10/05/2020 09:28:18 -------------------------------------------------------------------------------- Progress Note Details Patient Name: Date of Service: Nathan Miller, [place] UL Miller. 10/05/2020 9:30 A M Medical Record Number: [id number] Patient Account Number: [id number] Date of Birth/Sex: Treating RN: July 04, 1951 (68 y.o. [name]) Carlene Coria Primary Care Provider: Other Clinician: Mackie Pai NE Referring Provider: Treating Provider/Extender: Kris Hartmann, [place] NE Weeks in Treatment: 40 Subjective History of Present Illness (HPI) The following HPI elements were documented for the patient's wound: Location: right foot Severity: Wag 3 Duration: #months Modifying Factors: Diabetic and charcot' foot.. Peripheral vascular disease Associated Signs  and Symptoms: osteo see chief complaint. 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 This is been present for 5-6 years. He underwent a course of IV any biotics and hyperbaric oxygen roughly 5 years ago. He was

## 2020-10-12 DIAGNOSIS — Z23 Encounter for immunization: Secondary | ICD-10-CM | POA: Diagnosis not present

## 2020-10-12 DIAGNOSIS — I10 Essential (primary) hypertension: Secondary | ICD-10-CM | POA: Diagnosis not present

## 2020-10-12 DIAGNOSIS — E11622 Type 2 diabetes mellitus with other skin ulcer: Secondary | ICD-10-CM | POA: Diagnosis not present

## 2020-10-12 DIAGNOSIS — Z6839 Body mass index (BMI) 39.0-39.9, adult: Secondary | ICD-10-CM | POA: Diagnosis not present

## 2020-10-12 DIAGNOSIS — M86671 Other chronic osteomyelitis, right ankle and foot: Secondary | ICD-10-CM | POA: Diagnosis not present

## 2020-10-12 DIAGNOSIS — E782 Mixed hyperlipidemia: Secondary | ICD-10-CM | POA: Diagnosis not present

## 2020-10-12 DIAGNOSIS — E6609 Other obesity due to excess calories: Secondary | ICD-10-CM | POA: Diagnosis not present

## 2020-10-20 DIAGNOSIS — M1712 Unilateral primary osteoarthritis, left knee: Secondary | ICD-10-CM | POA: Diagnosis not present

## 2020-10-30 ENCOUNTER — Other Ambulatory Visit: Payer: Self-pay

## 2020-10-30 ENCOUNTER — Emergency Department (HOSPITAL_COMMUNITY)
Admission: EM | Admit: 2020-10-30 | Discharge: 2020-10-30 | Disposition: A | Discharge status: Home / Self Care | Payer: PPO | Attending: Emergency Medicine | Admitting: Emergency Medicine

## 2020-10-30 ENCOUNTER — Emergency Department (HOSPITAL_COMMUNITY): Payer: PPO

## 2020-10-30 ENCOUNTER — Encounter (HOSPITAL_COMMUNITY): Payer: Self-pay | Admitting: Emergency Medicine

## 2020-10-30 DIAGNOSIS — R0902 Hypoxemia: Secondary | ICD-10-CM | POA: Diagnosis not present

## 2020-10-30 DIAGNOSIS — Z79899 Other long term (current) drug therapy: Secondary | ICD-10-CM | POA: Insufficient documentation

## 2020-10-30 DIAGNOSIS — I2510 Atherosclerotic heart disease of native coronary artery without angina pectoris: Secondary | ICD-10-CM | POA: Insufficient documentation

## 2020-10-30 DIAGNOSIS — L97519 Non-pressure chronic ulcer of other part of right foot with unspecified severity: Secondary | ICD-10-CM | POA: Insufficient documentation

## 2020-10-30 DIAGNOSIS — Z20822 Contact with and (suspected) exposure to covid-19: Secondary | ICD-10-CM | POA: Diagnosis not present

## 2020-10-30 DIAGNOSIS — R079 Chest pain, unspecified: Secondary | ICD-10-CM | POA: Diagnosis not present

## 2020-10-30 DIAGNOSIS — R231 Pallor: Secondary | ICD-10-CM | POA: Diagnosis not present

## 2020-10-30 DIAGNOSIS — R0602 Shortness of breath: Secondary | ICD-10-CM

## 2020-10-30 DIAGNOSIS — I517 Cardiomegaly: Secondary | ICD-10-CM | POA: Diagnosis not present

## 2020-10-30 DIAGNOSIS — Z950 Presence of cardiac pacemaker: Secondary | ICD-10-CM | POA: Insufficient documentation

## 2020-10-30 DIAGNOSIS — Z7984 Long term (current) use of oral hypoglycemic drugs: Secondary | ICD-10-CM | POA: Diagnosis not present

## 2020-10-30 DIAGNOSIS — R0789 Other chest pain: Secondary | ICD-10-CM | POA: Insufficient documentation

## 2020-10-30 DIAGNOSIS — E11621 Type 2 diabetes mellitus with foot ulcer: Secondary | ICD-10-CM | POA: Diagnosis not present

## 2020-10-30 DIAGNOSIS — Z7982 Long term (current) use of aspirin: Secondary | ICD-10-CM | POA: Diagnosis not present

## 2020-10-30 DIAGNOSIS — J189 Pneumonia, unspecified organism: Secondary | ICD-10-CM | POA: Diagnosis not present

## 2020-10-30 DIAGNOSIS — Z951 Presence of aortocoronary bypass graft: Secondary | ICD-10-CM | POA: Insufficient documentation

## 2020-10-30 DIAGNOSIS — I509 Heart failure, unspecified: Secondary | ICD-10-CM | POA: Diagnosis not present

## 2020-10-30 DIAGNOSIS — R0689 Other abnormalities of breathing: Secondary | ICD-10-CM | POA: Diagnosis not present

## 2020-10-30 DIAGNOSIS — I110 Hypertensive heart disease with heart failure: Secondary | ICD-10-CM | POA: Diagnosis not present

## 2020-10-30 DIAGNOSIS — Z87891 Personal history of nicotine dependence: Secondary | ICD-10-CM | POA: Diagnosis not present

## 2020-10-30 LAB — CBC
HCT: 44.9 % (ref 39.0–52.0)
Hemoglobin: 14.2 g/dL (ref 13.0–17.0)
MCH: 28.7 pg (ref 26.0–34.0)
MCHC: 31.6 g/dL (ref 30.0–36.0)
MCV: 90.7 fL (ref 80.0–100.0)
Platelets: 279 10*3/uL (ref 150–400)
RBC: 4.95 MIL/uL (ref 4.22–5.81)
RDW: 14.9 % (ref 11.5–15.5)
WBC: 10.7 10*3/uL — ABNORMAL HIGH (ref 4.0–10.5)
nRBC: 0 % (ref 0.0–0.2)

## 2020-10-30 LAB — RESP PANEL BY RT-PCR (FLU A&B, COVID) ARPGX2
Influenza A by PCR: NEGATIVE
Influenza B by PCR: NEGATIVE
SARS Coronavirus 2 by RT PCR: NEGATIVE

## 2020-10-30 LAB — TROPONIN I (HIGH SENSITIVITY)
Troponin I (High Sensitivity): 12 ng/L (ref <18)
Troponin I (High Sensitivity): 14 ng/L (ref <18)

## 2020-10-30 LAB — BASIC METABOLIC PANEL
Anion gap: 9 (ref 5–15)
BUN: 11 mg/dL (ref 8–23)
CO2: 30 mmol/L (ref 22–32)
Calcium: 9 mg/dL (ref 8.9–10.3)
Chloride: 95 mmol/L — ABNORMAL LOW (ref 98–111)
Creatinine, Ser: 0.69 mg/dL (ref 0.61–1.24)
GFR, Estimated: >60 mL/min (ref >60)
Glucose, Bld: 110 mg/dL — ABNORMAL HIGH (ref 70–99)
Potassium: 4.3 mmol/L (ref 3.5–5.1)
Sodium: 134 mmol/L — ABNORMAL LOW (ref 135–145)

## 2020-10-30 LAB — D-DIMER, QUANTITATIVE: D-Dimer, Quant: 0.44 ug/mL-FEU (ref 0.00–0.50)

## 2020-10-30 MED ORDER — AZITHROMYCIN 250 MG PO TABS: 250.0000 mg | ORAL_TABLET | Freq: Every day | ORAL | 0 refills | Status: AC

## 2020-10-30 MED ORDER — AZITHROMYCIN 250 MG PO TABS: ORAL_TABLET | ORAL | 0 refills | Status: DC

## 2020-10-30 MED ORDER — AZITHROMYCIN 250 MG PO TABS
500.0000 mg | ORAL_TABLET | Freq: Once | ORAL | Status: AC
  Administered 2020-10-30: 500 mg via ORAL
  Filled 2020-10-30: qty 2

## 2020-10-30 NOTE — ED Notes (Signed)
Awaiting discharge paperwork revision and first dose of antibiotic.

## 2020-10-30 NOTE — Discharge Instructions (Addendum)
You were evaluated in the Emergency Department and after careful evaluation, we did not find any emergent condition requiring admission or further testing in the hospital.  Your exam/testing today was overall reassuring.  No evidence of heart damage or other emergencies.  X-ray may show signs of pneumonia.  Please take the antibiotics as directed.  Please return to the Emergency Department if you experience any worsening of your condition.  Thank you for allowing [place] to be a part of your care.

## 2020-10-30 NOTE — ED Provider Notes (Signed)
[facility] EMERGENCY DEPARTMENT Provider Note   CSN: [id number] Arrival date & time: 10/30/20  1518     History Chief Complaint  Patient presents with  . Chest Pain    Nathan Miller is a 70 y.o. male.  Nathan Miller is a 70 y.o. male with a history of CAD, CHF, hypertension, diabetes, who presents to the ED via EMS for evaluation of chest tightness.  Patient reports he was cooking some fat back on a hot stove this afternoon, and while he was breathing in the grease and fumes from the fat back he developed chest tightness and shortness of breath.  He reports he had to step away from the stove and sit down.  He got a little sweaty when this occurred but that went away.  He denies feeling lightheaded or passing out.  He reports after stepping away symptoms seem to gradually improve and now he denies any chest pain or tightness and reports his breathing feels normal.  No associated cough or fever.  No lower extremity swelling.  No abdominal pain, nausea or vomiting.  Reports history of previous MIs but did not have similar pain with these.  Is followed by Dr. Rayann Heman with cardiology.  Has a pacemaker in place, has not had any issues with this recently.        Past Medical History:  Diagnosis Date  . CAD (coronary artery disease)    NSTEMI in 2001. Cardiac cath showed: LAD: 40% proximal, LCX: 70% mid, RCA thrombotic occlusion in mid segment. s/p PCI and 2 overlapped BMSs to RCA.   [name] CHF (congestive heart failure) ([place])   . Diabetes mellitus   . Hypertension   . Osteomyelitis [facility])     Patient Active Problem List   Diagnosis Date Noted  . 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  . Umbilical hernia [id number]  . Obesity 01/29/2016  . Marijuana use 01/29/2016  . Diabetes type 2, controlled ([place]) 01/29/2016  . Diverticulitis of intestine with perforation without bleeding   . Blood poisoning   . Diverticulitis of intestine with perforation 06/13/2015  . Diverticulitis 06/13/2015  . 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 Faintness   . Bradycardia 01/21/2015  . Syncope 01/21/2015  . Complete heart block ([place]) 01/21/2015  . Right foot ulcer ([place]) 01/21/2015  . Diabetes mellitus type 2, uncontrolled ([place]) 01/21/2015  . Hyperlipidemia LDL goal < 100 02/11/2013  . Tobacco use disorder 02/11/2013  . Anxiety state, unspecified 02/11/2013  . Essential hypertension 05/10/2011  . Papule 03/20/2011  . Osteomyelitis ([place])   . CAD S/P percutaneous coronary angioplasty; bms X 2 to RCA 2001 01/22/2000    Class: Diagnosis of    Past Surgical History:  Procedure Laterality Date  . CARDIAC CATHETERIZATION    . CORONARY ANGIOPLASTY    . PERMANENT PACEMAKER INSERTION N/A 01/22/2015   MDT Advisa pacemaker implanted for complete heart block by Dr Rayann Heman  . TEMPORARY PACEMAKER INSERTION N/A 01/21/2015   Procedure: TEMPORARY PACEMAKER INSERTION;  Surgeon: Leonie Man, MD;  Location[facility] CATH LAB;  Service: Cardiovascular;  Laterality: N/A;       Family History  Problem Relation Age of Onset  . Heart disease Father   . COPD Father   . Heart disease Paternal Grandfather     Social History   Tobacco Use  . Smoking status: Former Smoker    Packs/day: 0.30    Years: 40.00    Pack years: 12.00    Types: Cigarettes  Start date: 10/16/1968    Quit date: 10/17/2007    Years since quitting: 13.0  . Smokeless tobacco: Current User    Types: Snuff  Vaping Use  . Vaping Use: Never used  Substance Use Topics  . Alcohol use: Yes    Alcohol/week: 42.0 standard drinks    Types: 42 Cans of beer per week    Comment: beer  . Drug use: No    Home Medications Prior to Admission medications   Medication Sig Start Date End Date Taking? Authorizing Provider  aspirin EC 81 MG tablet Take 1 tablet (81 mg total) by mouth daily. 01/25/15  Yes Short, Noah Delaine, MD  atorvastatin (LIPITOR) 80 MG tablet Take 1 tablet (80 mg total) by mouth daily. 05/04/17  Yes Allred, Jeneen Rinks, MD   carvedilol (COREG) 25 MG tablet Take 1 tablet (25 mg total) by mouth 2 (two) times daily. 05/04/17  Yes Allred, Jeneen Rinks, MD  lisinopril (ZESTRIL) 40 MG tablet Take 1 tablet (40 mg total) by mouth daily. 06/04/20  Yes Allred, Jeneen Rinks, MD  metFORMIN (GLUCOPHAGE) 500 MG tablet Take 1 tablet (500 mg total) by mouth 2 (two) times daily with a meal. 01/25/15  Yes Short, Noah Delaine, MD  Multiple Vitamins-Minerals (CENTRUM ADULTS) TABS Take 1 tablet by mouth daily.   Yes [provider]  nitroGLYCERIN (NITROSTAT) 0.4 MG SL tablet Place 1 tablet (0.4 mg total) under the tongue every 5 (five) minutes as needed for chest pain. 01/25/15  Yes Janece Canterbury, MD    Allergies    Penicillins  Review of Systems   Review of Systems  Constitutional: Positive for diaphoresis. Negative for chills and fever.  HENT: Negative.   Respiratory: Positive for chest tightness and shortness of breath. Negative for cough and wheezing.   Cardiovascular: Positive for chest pain. Negative for palpitations and leg swelling.  Gastrointestinal: Negative for abdominal pain, diarrhea, nausea and vomiting.  Genitourinary: Negative for dysuria and frequency.  Musculoskeletal: Negative for arthralgias and myalgias.  Skin: Negative for color change and rash.  Neurological: Negative for dizziness, syncope and light-headedness.  All other systems reviewed and are negative.   Physical Exam Updated Vital Signs BP (!) 152/64   Pulse (!) 49   Temp 97.9 F (36.6 C) (Oral)   Resp 18   SpO2 95%   Physical Exam Vitals and nursing note reviewed.  Constitutional:      General: He is not in acute distress.    Appearance: He is well-developed and well-nourished. He is not ill-appearing or diaphoretic.     Comments: Well-appearing and in no distress   HENT:     Head: Normocephalic and atraumatic.     Mouth/Throat:     Mouth: Oropharynx is clear and moist.  Eyes:     General:        Right eye: No discharge.        Left eye: No  discharge.     Extraocular Movements: EOM normal.     Pupils: Pupils are equal, round, and reactive to light.  Cardiovascular:     Rate and Rhythm: Normal rate and regular rhythm.     Pulses: Intact distal pulses.          Radial pulses are 2+ on the right side and 2+ on the left side.       Dorsalis pedis pulses are 2+ on the right side and 2+ on the left side.     Heart sounds: Normal heart sounds. No murmur heard. No friction rub.  No gallop.   Pulmonary:     Effort: Pulmonary effort is normal. No respiratory distress.     Breath sounds: Normal breath sounds. No wheezing or rales.     Comments: Respirations equal and unlabored, patient able to speak in full sentences, lungs clear to auscultation bilaterally  Chest:     Chest wall: No tenderness.  Abdominal:     General: Bowel sounds are normal. There is no distension.     Palpations: Abdomen is soft. There is no mass.     Tenderness: There is no abdominal tenderness. There is no guarding.     Comments: Abdomen soft, nondistended, nontender to palpation in all quadrants without guarding or peritoneal signs   Musculoskeletal:        General: No deformity or edema.     Cervical back: Neck supple.     Right lower leg: No tenderness. No edema.     Left lower leg: No tenderness. No edema.  Skin:    General: Skin is warm and dry.     Capillary Refill: Capillary refill takes less than 2 seconds.  Neurological:     Mental Status: He is alert.     Coordination: Coordination normal.     Comments: Speech is clear, able to follow commands Moves extremities without ataxia, coordination intact  Psychiatric:        Mood and Affect: Mood normal.        Behavior: Behavior normal.     ED Results / Procedures / Treatments   Labs (all labs ordered are listed, but only abnormal results are displayed) Labs Reviewed  BASIC METABOLIC PANEL - Abnormal; Notable for the following components:      Result Value   Sodium 134 (*)    Chloride 95 (*)     Glucose, Bld 110 (*)    All other components within normal limits  CBC - Abnormal; Notable for the following components:   WBC 10.7 (*)    All other components within normal limits  RESP PANEL BY RT-PCR (FLU A&B, COVID) [id number]  D-DIMER, QUANTITATIVE (NOT AT [facility])  TROPONIN I (HIGH SENSITIVITY)  TROPONIN I (HIGH SENSITIVITY)    EKG EKG Interpretation  Date/Time:  Saturday October 30 2020 15:33:35 EST Ventricular Rate:  63 PR Interval:    QRS Duration: 179 QT Interval:  479 QTC Calculation: 491 R Axis:   -88 Text Interpretation: VENTRICULAR PACED RHYTHM Confirmed by Gerlene Fee [phone]) on 10/30/2020 3:40:58 PM   Radiology DG Chest Portable 1 View  Result Date: 10/30/2020 CLINICAL DATA:  Chest pain starting this afternoon. EXAM: PORTABLE CHEST 1 VIEW COMPARISON:  01/23/2015 FINDINGS: Cardiac pacemaker. Shallow inspiration. Mild cardiac enlargement. Patchy infiltration with peribronchial thickening in the lung bases may indicate early changes of multifocal pneumonia. No pleural effusions. No pneumothorax. Mediastinal contours appear intact. IMPRESSION: Patchy infiltration and peribronchial thickening in the lung bases may indicate early changes of multifocal pneumonia. Electronically Signed   By: Lucienne Capers M.D.   On: 10/30/2020 17:07    Procedures Procedures (including critical care time)  Medications Ordered in ED Medications - No data to display  ED Course  I have reviewed the triage vital signs and the nursing notes.  Pertinent labs & imaging results that were available during my care of the patient were reviewed by me and considered in my medical decision making (see chart for details).    MDM Rules/Calculators/A&P  Patient presents to the emergency department with chest pain. Patient nontoxic appearing, in no apparent distress, vitals without significant abnormality. Fairly benign physical exam.   Patient's primary cardiologist: Dr.  Johney Frame  Patient's pacemaker was interrogated with no significant abnormalities noted, pacemaker functioning appropriately.  DDX: ACS, pulmonary embolism, dissection, pneumothorax, pneumonia, arrhythmia, severe anemia, MSK, GERD, anxiety. Evaluation initiated with labs, EKG, and CXR. Patient on cardiac monitor.   CBC: Minimal leukocytosis of 10.7, normal hemoglobin BMP: Sodium of 134 and chloride of 95, glucose of 110 but no other electrolyte derangements, normal renal function Troponin: Initial troponin is negative D-dimer: Negative EKG: Ventricularly paced rhythm with no significant changes when compared to previous CXR: Patchy infiltration with peribronchial thickening in the lung bases noted which may indicate early multifocal pneumonia  Covid and flu testing is negative today, patient not experiencing cough or fever and is satting well on room air without increased work of breathing.  At shift change delta troponin is pending, if this is negative given that patient symptoms were brief and have completely resolved feel he will not require further emergent cardiac evaluation and can be discharged home likely with a Z-Pak given early signs of multifocal pneumonia.  At shift change care signed out to Dr. Pilar Plate who will follow up on pending troponin and disposition appropriately.     Final Clinical Impression(s) / ED Diagnoses Final diagnoses:  Chest tightness  Shortness of breath    Rx / DC Orders ED Discharge Orders    None       Legrand Rams 10/30/20 1912    Sabas Sous, MD 10/30/20 2114

## 2020-10-30 NOTE — ED Triage Notes (Signed)
Pt from home via [facility]. Pt reports chest pain 8/10 that started while cooking this afternoon. Pt reports that the pain gets worse with inhalation.

## 2020-10-30 NOTE — ED Notes (Signed)
Pt ambulated to restroom and reports no difficulty or pain

## 2020-10-30 NOTE — ED Notes (Signed)
Pt requests to sit in chair. Chair moved closer to cardiac monitor and assisted pt to chair.

## 2020-12-27 ENCOUNTER — Ambulatory Visit (INDEPENDENT_AMBULATORY_CARE_PROVIDER_SITE_OTHER): Payer: PPO

## 2020-12-27 DIAGNOSIS — I442 Atrioventricular block, complete: Secondary | ICD-10-CM | POA: Diagnosis not present

## 2020-12-29 LAB — CUP PACEART REMOTE DEVICE CHECK
Battery Remaining Longevity: 59 mo
Battery Voltage: 2.99 V
Brady Statistic RV Percent Paced: 99.8 %
Date Time Interrogation Session: 20220301121551
Implantable Lead Implant Date: 20160325
Implantable Lead Location: 753860
Implantable Lead Model: 5076
Implantable Pulse Generator Implant Date: 20160325
Lead Channel Impedance Value: 513 Ohm
Lead Channel Impedance Value: 532 Ohm
Lead Channel Pacing Threshold Amplitude: 0.625 V
Lead Channel Pacing Threshold Pulse Width: 0.4 ms
Lead Channel Sensing Intrinsic Amplitude: 21.625 mV
Lead Channel Sensing Intrinsic Amplitude: 21.625 mV
Lead Channel Setting Pacing Amplitude: 2.5 V
Lead Channel Setting Pacing Pulse Width: 0.4 ms
Lead Channel Setting Sensing Sensitivity: 0.9 mV

## 2021-01-04 NOTE — Progress Notes (Signed)
Remote pacemaker transmission.   

## 2021-02-01 DIAGNOSIS — M5459 Other low back pain: Secondary | ICD-10-CM | POA: Diagnosis not present

## 2021-02-08 DIAGNOSIS — M5416 Radiculopathy, lumbar region: Secondary | ICD-10-CM | POA: Diagnosis not present

## 2021-03-29 ENCOUNTER — Ambulatory Visit (INDEPENDENT_AMBULATORY_CARE_PROVIDER_SITE_OTHER): Payer: PPO

## 2021-03-29 DIAGNOSIS — I442 Atrioventricular block, complete: Secondary | ICD-10-CM

## 2021-03-30 ENCOUNTER — Inpatient Hospital Stay (HOSPITAL_COMMUNITY)
Admission: EM | Admit: 2021-03-30 | Discharge: 2021-04-08 | DRG: 291 | Disposition: A | Discharge status: Home Health | Payer: PPO | Attending: Internal Medicine | Admitting: Internal Medicine

## 2021-03-30 ENCOUNTER — Emergency Department (HOSPITAL_COMMUNITY): Payer: PPO

## 2021-03-30 ENCOUNTER — Other Ambulatory Visit: Payer: Self-pay

## 2021-03-30 ENCOUNTER — Encounter (HOSPITAL_COMMUNITY): Payer: Self-pay

## 2021-03-30 DIAGNOSIS — E785 Hyperlipidemia, unspecified: Secondary | ICD-10-CM | POA: Diagnosis present

## 2021-03-30 DIAGNOSIS — Z6838 Body mass index (BMI) 38.0-38.9, adult: Secondary | ICD-10-CM

## 2021-03-30 DIAGNOSIS — E119 Type 2 diabetes mellitus without complications: Secondary | ICD-10-CM | POA: Diagnosis not present

## 2021-03-30 DIAGNOSIS — I442 Atrioventricular block, complete: Secondary | ICD-10-CM | POA: Diagnosis present

## 2021-03-30 DIAGNOSIS — M869 Osteomyelitis, unspecified: Secondary | ICD-10-CM

## 2021-03-30 DIAGNOSIS — J9811 Atelectasis: Secondary | ICD-10-CM | POA: Diagnosis not present

## 2021-03-30 DIAGNOSIS — J189 Pneumonia, unspecified organism: Secondary | ICD-10-CM | POA: Diagnosis not present

## 2021-03-30 DIAGNOSIS — I110 Hypertensive heart disease with heart failure: Principal | ICD-10-CM | POA: Diagnosis present

## 2021-03-30 DIAGNOSIS — I2510 Atherosclerotic heart disease of native coronary artery without angina pectoris: Secondary | ICD-10-CM | POA: Diagnosis present

## 2021-03-30 DIAGNOSIS — R0602 Shortness of breath: Secondary | ICD-10-CM | POA: Diagnosis present

## 2021-03-30 DIAGNOSIS — Z7984 Long term (current) use of oral hypoglycemic drugs: Secondary | ICD-10-CM

## 2021-03-30 DIAGNOSIS — Z950 Presence of cardiac pacemaker: Secondary | ICD-10-CM | POA: Diagnosis not present

## 2021-03-30 DIAGNOSIS — E11621 Type 2 diabetes mellitus with foot ulcer: Secondary | ICD-10-CM | POA: Diagnosis present

## 2021-03-30 DIAGNOSIS — R069 Unspecified abnormalities of breathing: Secondary | ICD-10-CM | POA: Diagnosis not present

## 2021-03-30 DIAGNOSIS — R6883 Chills (without fever): Secondary | ICD-10-CM | POA: Diagnosis not present

## 2021-03-30 DIAGNOSIS — I5031 Acute diastolic (congestive) heart failure: Secondary | ICD-10-CM | POA: Diagnosis present

## 2021-03-30 DIAGNOSIS — Z955 Presence of coronary angioplasty implant and graft: Secondary | ICD-10-CM | POA: Diagnosis not present

## 2021-03-30 DIAGNOSIS — I252 Old myocardial infarction: Secondary | ICD-10-CM | POA: Diagnosis not present

## 2021-03-30 DIAGNOSIS — J439 Emphysema, unspecified: Secondary | ICD-10-CM | POA: Diagnosis not present

## 2021-03-30 DIAGNOSIS — J9601 Acute respiratory failure with hypoxia: Secondary | ICD-10-CM | POA: Diagnosis present

## 2021-03-30 DIAGNOSIS — Z825 Family history of asthma and other chronic lower respiratory diseases: Secondary | ICD-10-CM

## 2021-03-30 DIAGNOSIS — L97412 Non-pressure chronic ulcer of right heel and midfoot with fat layer exposed: Secondary | ICD-10-CM | POA: Diagnosis present

## 2021-03-30 DIAGNOSIS — I10 Essential (primary) hypertension: Secondary | ICD-10-CM | POA: Diagnosis not present

## 2021-03-30 DIAGNOSIS — M7989 Other specified soft tissue disorders: Secondary | ICD-10-CM | POA: Diagnosis not present

## 2021-03-30 DIAGNOSIS — Z8249 Family history of ischemic heart disease and other diseases of the circulatory system: Secondary | ICD-10-CM | POA: Diagnosis not present

## 2021-03-30 DIAGNOSIS — A4181 Sepsis due to Enterococcus: Secondary | ICD-10-CM | POA: Diagnosis not present

## 2021-03-30 DIAGNOSIS — Z95828 Presence of other vascular implants and grafts: Secondary | ICD-10-CM

## 2021-03-30 DIAGNOSIS — Z20822 Contact with and (suspected) exposure to covid-19: Secondary | ICD-10-CM | POA: Diagnosis present

## 2021-03-30 DIAGNOSIS — Z7982 Long term (current) use of aspirin: Secondary | ICD-10-CM

## 2021-03-30 DIAGNOSIS — E669 Obesity, unspecified: Secondary | ICD-10-CM | POA: Diagnosis present

## 2021-03-30 DIAGNOSIS — Z87891 Personal history of nicotine dependence: Secondary | ICD-10-CM

## 2021-03-30 DIAGNOSIS — Z880 Allergy status to penicillin: Secondary | ICD-10-CM

## 2021-03-30 DIAGNOSIS — R7881 Bacteremia: Secondary | ICD-10-CM | POA: Diagnosis not present

## 2021-03-30 DIAGNOSIS — T501X5A Adverse effect of loop [high-ceiling] diuretics, initial encounter: Secondary | ICD-10-CM | POA: Diagnosis not present

## 2021-03-30 DIAGNOSIS — Q211 Atrial septal defect: Secondary | ICD-10-CM

## 2021-03-30 DIAGNOSIS — R0609 Other forms of dyspnea: Secondary | ICD-10-CM | POA: Diagnosis not present

## 2021-03-30 DIAGNOSIS — R7989 Other specified abnormal findings of blood chemistry: Secondary | ICD-10-CM | POA: Diagnosis present

## 2021-03-30 DIAGNOSIS — R509 Fever, unspecified: Secondary | ICD-10-CM

## 2021-03-30 DIAGNOSIS — Z79899 Other long term (current) drug therapy: Secondary | ICD-10-CM

## 2021-03-30 DIAGNOSIS — Z9861 Coronary angioplasty status: Secondary | ICD-10-CM

## 2021-03-30 DIAGNOSIS — I361 Nonrheumatic tricuspid (valve) insufficiency: Secondary | ICD-10-CM | POA: Diagnosis not present

## 2021-03-30 DIAGNOSIS — R0689 Other abnormalities of breathing: Secondary | ICD-10-CM | POA: Diagnosis not present

## 2021-03-30 DIAGNOSIS — N179 Acute kidney failure, unspecified: Secondary | ICD-10-CM | POA: Diagnosis not present

## 2021-03-30 DIAGNOSIS — I517 Cardiomegaly: Secondary | ICD-10-CM | POA: Diagnosis not present

## 2021-03-30 DIAGNOSIS — Z981 Arthrodesis status: Secondary | ICD-10-CM | POA: Diagnosis not present

## 2021-03-30 DIAGNOSIS — I509 Heart failure, unspecified: Secondary | ICD-10-CM | POA: Diagnosis not present

## 2021-03-30 DIAGNOSIS — R9431 Abnormal electrocardiogram [ECG] [EKG]: Secondary | ICD-10-CM | POA: Diagnosis not present

## 2021-03-30 DIAGNOSIS — E08621 Diabetes mellitus due to underlying condition with foot ulcer: Secondary | ICD-10-CM | POA: Diagnosis not present

## 2021-03-30 HISTORY — DX: Presence of cardiac pacemaker: Z95.0

## 2021-03-30 LAB — BASIC METABOLIC PANEL
Anion gap: 13 (ref 5–15)
BUN: 8 mg/dL (ref 8–23)
CO2: 28 mmol/L (ref 22–32)
Calcium: 8.8 mg/dL — ABNORMAL LOW (ref 8.9–10.3)
Chloride: 90 mmol/L — ABNORMAL LOW (ref 98–111)
Creatinine, Ser: 0.75 mg/dL (ref 0.61–1.24)
GFR, Estimated: >60 mL/min (ref >60)
Glucose, Bld: 143 mg/dL — ABNORMAL HIGH (ref 70–99)
Potassium: 3.7 mmol/L (ref 3.5–5.1)
Sodium: 131 mmol/L — ABNORMAL LOW (ref 135–145)

## 2021-03-30 LAB — CUP PACEART REMOTE DEVICE CHECK
Battery Remaining Longevity: 56 mo
Battery Voltage: 2.99 V
Brady Statistic RV Percent Paced: 99.72 %
Date Time Interrogation Session: 20220601104433
Implantable Lead Implant Date: 20160325
Implantable Lead Location: 753860
Implantable Lead Model: 5076
Implantable Pulse Generator Implant Date: 20160325
Lead Channel Impedance Value: 475 Ohm
Lead Channel Impedance Value: 532 Ohm
Lead Channel Pacing Threshold Amplitude: 0.625 V
Lead Channel Pacing Threshold Pulse Width: 0.4 ms
Lead Channel Sensing Intrinsic Amplitude: 22.875 mV
Lead Channel Sensing Intrinsic Amplitude: 22.875 mV
Lead Channel Setting Pacing Amplitude: 2.5 V
Lead Channel Setting Pacing Pulse Width: 0.4 ms
Lead Channel Setting Sensing Sensitivity: 0.9 mV

## 2021-03-30 LAB — CBC WITH DIFFERENTIAL/PLATELET
Abs Immature Granulocytes: 0.07 10*3/uL (ref 0.00–0.07)
Basophils Absolute: 0 10*3/uL (ref 0.0–0.1)
Basophils Relative: 0 %
Eosinophils Absolute: 0 10*3/uL (ref 0.0–0.5)
Eosinophils Relative: 0 %
HCT: 45 % (ref 39.0–52.0)
Hemoglobin: 14.6 g/dL (ref 13.0–17.0)
Immature Granulocytes: 1 %
Lymphocytes Relative: 5 %
Lymphs Abs: 0.8 10*3/uL (ref 0.7–4.0)
MCH: 29.5 pg (ref 26.0–34.0)
MCHC: 32.4 g/dL (ref 30.0–36.0)
MCV: 90.9 fL (ref 80.0–100.0)
Monocytes Absolute: 1.1 10*3/uL — ABNORMAL HIGH (ref 0.1–1.0)
Monocytes Relative: 7 %
Neutro Abs: 13.2 10*3/uL — ABNORMAL HIGH (ref 1.7–7.7)
Neutrophils Relative %: 87 %
Platelets: 237 10*3/uL (ref 150–400)
RBC: 4.95 MIL/uL (ref 4.22–5.81)
RDW: 14.2 % (ref 11.5–15.5)
WBC: 15.2 10*3/uL — ABNORMAL HIGH (ref 4.0–10.5)
nRBC: 0 % (ref 0.0–0.2)

## 2021-03-30 LAB — TROPONIN I (HIGH SENSITIVITY): Troponin I (High Sensitivity): 20 ng/L — ABNORMAL HIGH (ref <18)

## 2021-03-30 NOTE — ED Provider Notes (Signed)
[facility] EMERGENCY DEPARTMENT Provider Note   CSN: [id number] Arrival date & time: 03/30/21  2218     History Chief Complaint  Patient presents with  . Shortness of Breath    Nathan Miller is a 70 y.o. male.  The history is provided by the patient and medical records.  Shortness of Breath   70 y.o. M with hx of CAD, CHF, DM, HTN, presenting to the ED for SOB.  Patient states usually in the afternoon around 1PM he lays down for a nap.  States today when he did he started having chills, cough, SOB, and feeling unwell.  This persisted for a few hours prior to calling EMS.  He is currently on 3L supplemental O2 but does not generally require home O2.  He denies any sick contacts.  He has been vaccinated for covid-19.  Denies chest pain, leg swelling, but unsure of weight gain.  Did have some issue laying flat earlier today.  He does have pacemaker, known AV block with this.  Denies chest pain or pain with deep breathing.  Past Medical History:  Diagnosis Date  . CAD (coronary artery disease)    NSTEMI in 2001. Cardiac cath showed: LAD: 40% proximal, LCX: 70% mid, RCA thrombotic occlusion in mid segment. s/p PCI and 2 overlapped BMSs to RCA.   [name] CHF (congestive heart failure) ([place])   . Diabetes mellitus   . Hypertension   . Osteomyelitis [facility])     Patient Active Problem List   Diagnosis Date Noted  . 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  . Umbilical hernia 12/45/8099  . Obesity 01/29/2016  . Marijuana use 01/29/2016  . Diabetes type 2, controlled ([place]) 01/29/2016  . Diverticulitis of intestine with perforation without bleeding   . Blood poisoning   . Diverticulitis of intestine with perforation 06/13/2015  . Diverticulitis 06/13/2015  . 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 Faintness   . Bradycardia 01/21/2015  . Syncope 01/21/2015  . Complete heart block ([place]) 01/21/2015  . Right foot ulcer ([place]) 01/21/2015  . Diabetes mellitus type 2,  uncontrolled ([place]) 01/21/2015  . Hyperlipidemia LDL goal < 100 02/11/2013  . Tobacco use disorder 02/11/2013  . Anxiety state, unspecified 02/11/2013  . Essential hypertension 05/10/2011  . Papule 03/20/2011  . Osteomyelitis ([place])   . CAD S/P percutaneous coronary angioplasty; bms X 2 to RCA 2001 01/22/2000    Class: Diagnosis of    Past Surgical History:  Procedure Laterality Date  . CARDIAC CATHETERIZATION    . CORONARY ANGIOPLASTY    . PERMANENT PACEMAKER INSERTION N/A 01/22/2015   MDT Advisa pacemaker implanted for complete heart block by Dr Rayann Heman  . TEMPORARY PACEMAKER INSERTION N/A 01/21/2015   Procedure: TEMPORARY PACEMAKER INSERTION;  Surgeon: Leonie Man, MD;  Location: [facility] CATH LAB;  Service: Cardiovascular;  Laterality: N/A;       Family History  Problem Relation Age of Onset  . Heart disease Father   . COPD Father   . Heart disease Paternal Grandfather     Social History   Tobacco Use  . Smoking status: Former Smoker    Packs/day: 0.30    Years: 40.00    Pack years: 12.00    Types: Cigarettes    Start date: 10/16/1968    Quit date: 10/17/2007    Years since quitting: 13.4  . Smokeless tobacco: Current User    Types: Snuff  Vaping Use  . Vaping Use: Never used  Substance Use Topics  .  Alcohol use: Yes    Alcohol/week: 42.0 standard drinks    Types: 42 Cans of beer per week    Comment: beer  . Drug use: No    Home Medications Prior to Admission medications   Medication Sig Start Date End Date Taking? Authorizing Provider  aspirin EC 81 MG tablet Take 1 tablet (81 mg total) by mouth daily. 01/25/15   Janece Canterbury, MD  atorvastatin (LIPITOR) 80 MG tablet Take 1 tablet (80 mg total) by mouth daily. 05/04/17   Allred, Jeneen Rinks, MD  carvedilol (COREG) 25 MG tablet Take 1 tablet (25 mg total) by mouth 2 (two) times daily. 05/04/17   Allred, Jeneen Rinks, MD  lisinopril (ZESTRIL) 40 MG tablet Take 1 tablet (40 mg total) by mouth daily. 06/04/20   Allred, Jeneen Rinks, MD   metFORMIN (GLUCOPHAGE) 500 MG tablet Take 1 tablet (500 mg total) by mouth 2 (two) times daily with a meal. 01/25/15   Janece Canterbury, MD  Multiple Vitamins-Minerals (CENTRUM ADULTS) TABS Take 1 tablet by mouth daily.    [provider]  nitroGLYCERIN (NITROSTAT) 0.4 MG SL tablet Place 1 tablet (0.4 mg total) under the tongue every 5 (five) minutes as needed for chest pain. 01/25/15   Janece Canterbury, MD    Allergies    Penicillins  Review of Systems   Review of Systems  Respiratory: Positive for shortness of breath.   All other systems reviewed and are negative.   Physical Exam Updated Vital Signs BP (!) 161/83 (BP Location: Left Arm)   Pulse (!) 50   Temp 98.4 F (36.9 C) (Oral)   Resp 20   Ht 5' 8.5" (1.74 m)   Wt 111.1 kg   SpO2 93%   BMI 36.71 kg/m   Physical Exam Vitals and nursing note reviewed.  Constitutional:      Appearance: He is well-developed.  HENT:     Head: Normocephalic and atraumatic.  Eyes:     Conjunctiva/sclera: Conjunctivae normal.     Pupils: Pupils are equal, round, and reactive to light.  Cardiovascular:     Rate and Rhythm: Normal rate and regular rhythm.     Heart sounds: Normal heart sounds.  Pulmonary:     Effort: Pulmonary effort is normal.     Breath sounds: Normal breath sounds. No wheezing or rhonchi.     Comments: On 3L supplemental O2, gets a bit winded with conversation Abdominal:     General: Bowel sounds are normal.     Palpations: Abdomen is soft.  Musculoskeletal:        General: Normal range of motion.     Cervical back: Normal range of motion.     Comments: Trace edema at the ankles  Skin:    General: Skin is warm and dry.  Neurological:     Mental Status: He is alert and oriented to person, place, and time.     ED Results / Procedures / Treatments   Labs (all labs ordered are listed, but only abnormal results are displayed) Labs Reviewed  CBC WITH DIFFERENTIAL/PLATELET - Abnormal; Notable for the  following components:      Result Value   WBC 15.2 (*)    Neutro Abs 13.2 (*)    Monocytes Absolute 1.1 (*)    All other components within normal limits  BASIC METABOLIC PANEL - Abnormal; Notable for the following components:   Sodium 131 (*)    Chloride 90 (*)    Glucose, Bld 143 (*)    Calcium  8.8 (*)    All other components within normal limits  BRAIN NATRIURETIC PEPTIDE - Abnormal; Notable for the following components:   B Natriuretic Peptide 503.2 (*)    All other components within normal limits  D-DIMER, QUANTITATIVE - Abnormal; Notable for the following components:   D-Dimer, Quant 0.69 (*)    All other components within normal limits  TROPONIN I (HIGH SENSITIVITY) - Abnormal; Notable for the following components:   Troponin I (High Sensitivity) 20 (*)    All other components within normal limits  RESP PANEL BY RT-PCR (FLU A&B, COVID) [id number]    EKG EKG Interpretation  Date/Time:  Wednesday March 30 2021 22:22:35 EDT Ventricular Rate:  55 PR Interval:    QRS Duration: 185 QT Interval:  479 QTC Calculation: 459 R Axis:   267 Text Interpretation: Complete AV block with ventricular pacing Confirmed by Davonna Belling [phone]) on 03/30/2021 10:26:53 PM   Radiology DG Chest Port 1 View  Result Date: 03/30/2021 CLINICAL DATA:  Shortness of breath, chills EXAM: PORTABLE CHEST 1 VIEW COMPARISON:  10/30/2020 FINDINGS: Left pacer remains in place, unchanged. Heart is borderline in size. Mild peribronchial thickening. Increased markings at the lung bases, likely atelectasis or scarring, similar to prior study. No effusions. No acute bony abnormality. IMPRESSION: Mild bronchitic changes. Bibasilar atelectasis or scarring. Electronically Signed   By: Rolm Baptise M.D.   On: 03/30/2021 22:54   CUP PACEART REMOTE DEVICE CHECK  Result Date: 03/30/2021 Scheduled remote reviewed. Normal device function.  Next remote 91 days. HB   Procedures Procedures   Medications Ordered in  ED Medications - No data to display  ED Course  I have reviewed the triage vital signs and the nursing notes.  Pertinent labs & imaging results that were available during my care of the patient were reviewed by me and considered in my medical decision making (see chart for details).    MDM Rules/Calculators/A&P  70 y.o. M here with sudden onset SOB, chills, and generally feeling unwell around 1PM today.  Denies sick contacts or known covid exposures, he is vaccinated.  He is afebrile, non-toxic in appearance here.  He does appear somewhat labored with conversation, some trace edema of the lower legs but no significant pitting edema.  Unsure of weight gain, did have some issues laying flat earlier today.  Will check labs, CXR, covid screen.  Currently on 3L and tolerating well.  Denies chest pain or pleuritic pain with deep breathing.  1:03 AM Patient reassessed-- we have reviewed results from visit.  BNP elevated at 500, elevated, trop 20 suggestive of some mild CHF but he does not appear significantly fluid overloaded and no frank edema on CXR.  He is still requiring approx 3L supplemental O2.  His covid screen is negative.  He states he has never been on lasix in the past.  Will give small dose here and see if any improvement.  Given his ongoing O2 requirement, will need admission.  Patient and wife agreeable.  1:54 AM Spoke with Dr. Tonie Griffith-- requested to add on d-dimer which has been done, will admit for further management.  Final Clinical Impression(s) / ED Diagnoses Final diagnoses:  Acute respiratory failure with hypoxia (HCC)  Elevated brain natriuretic peptide (BNP) level    Rx / DC Orders ED Discharge Orders    None       Larene Pickett, PA-C 03/31/21 0226    Merrily Pew, MD 03/31/21 [phone]

## 2021-03-30 NOTE — ED Triage Notes (Signed)
Shortness of breath starting around 1300 today. EMS arrived to find patient in chair awake and alert with mild shortness of breath. EKG showed possible 2nd degree block type ll.  Has pacemaker in place.

## 2021-03-31 ENCOUNTER — Inpatient Hospital Stay (HOSPITAL_COMMUNITY): Payer: PPO

## 2021-03-31 ENCOUNTER — Encounter (HOSPITAL_COMMUNITY): Payer: Self-pay | Admitting: Family Medicine

## 2021-03-31 DIAGNOSIS — J9811 Atelectasis: Secondary | ICD-10-CM | POA: Diagnosis not present

## 2021-03-31 DIAGNOSIS — E669 Obesity, unspecified: Secondary | ICD-10-CM | POA: Diagnosis present

## 2021-03-31 DIAGNOSIS — E785 Hyperlipidemia, unspecified: Secondary | ICD-10-CM | POA: Diagnosis present

## 2021-03-31 DIAGNOSIS — Z7984 Long term (current) use of oral hypoglycemic drugs: Secondary | ICD-10-CM | POA: Diagnosis not present

## 2021-03-31 DIAGNOSIS — I361 Nonrheumatic tricuspid (valve) insufficiency: Secondary | ICD-10-CM | POA: Diagnosis not present

## 2021-03-31 DIAGNOSIS — I517 Cardiomegaly: Secondary | ICD-10-CM | POA: Diagnosis not present

## 2021-03-31 DIAGNOSIS — J9601 Acute respiratory failure with hypoxia: Secondary | ICD-10-CM | POA: Diagnosis present

## 2021-03-31 DIAGNOSIS — Z20822 Contact with and (suspected) exposure to covid-19: Secondary | ICD-10-CM | POA: Diagnosis present

## 2021-03-31 DIAGNOSIS — Q211 Atrial septal defect: Secondary | ICD-10-CM | POA: Diagnosis not present

## 2021-03-31 DIAGNOSIS — Z955 Presence of coronary angioplasty implant and graft: Secondary | ICD-10-CM | POA: Diagnosis not present

## 2021-03-31 DIAGNOSIS — R7881 Bacteremia: Secondary | ICD-10-CM | POA: Diagnosis not present

## 2021-03-31 DIAGNOSIS — E11621 Type 2 diabetes mellitus with foot ulcer: Secondary | ICD-10-CM | POA: Diagnosis present

## 2021-03-31 DIAGNOSIS — I5031 Acute diastolic (congestive) heart failure: Secondary | ICD-10-CM | POA: Diagnosis present

## 2021-03-31 DIAGNOSIS — R7989 Other specified abnormal findings of blood chemistry: Secondary | ICD-10-CM

## 2021-03-31 DIAGNOSIS — Z8249 Family history of ischemic heart disease and other diseases of the circulatory system: Secondary | ICD-10-CM | POA: Diagnosis not present

## 2021-03-31 DIAGNOSIS — A4181 Sepsis due to Enterococcus: Secondary | ICD-10-CM | POA: Diagnosis not present

## 2021-03-31 DIAGNOSIS — Z950 Presence of cardiac pacemaker: Secondary | ICD-10-CM

## 2021-03-31 DIAGNOSIS — Z87891 Personal history of nicotine dependence: Secondary | ICD-10-CM | POA: Diagnosis not present

## 2021-03-31 DIAGNOSIS — T501X5A Adverse effect of loop [high-ceiling] diuretics, initial encounter: Secondary | ICD-10-CM | POA: Diagnosis not present

## 2021-03-31 DIAGNOSIS — I2510 Atherosclerotic heart disease of native coronary artery without angina pectoris: Secondary | ICD-10-CM | POA: Diagnosis present

## 2021-03-31 DIAGNOSIS — I442 Atrioventricular block, complete: Secondary | ICD-10-CM | POA: Diagnosis present

## 2021-03-31 DIAGNOSIS — R0602 Shortness of breath: Secondary | ICD-10-CM | POA: Diagnosis present

## 2021-03-31 DIAGNOSIS — L97412 Non-pressure chronic ulcer of right heel and midfoot with fat layer exposed: Secondary | ICD-10-CM | POA: Diagnosis present

## 2021-03-31 DIAGNOSIS — N179 Acute kidney failure, unspecified: Secondary | ICD-10-CM | POA: Diagnosis not present

## 2021-03-31 DIAGNOSIS — I252 Old myocardial infarction: Secondary | ICD-10-CM | POA: Diagnosis not present

## 2021-03-31 DIAGNOSIS — I110 Hypertensive heart disease with heart failure: Secondary | ICD-10-CM | POA: Diagnosis present

## 2021-03-31 DIAGNOSIS — Z825 Family history of asthma and other chronic lower respiratory diseases: Secondary | ICD-10-CM | POA: Diagnosis not present

## 2021-03-31 DIAGNOSIS — R0609 Other forms of dyspnea: Secondary | ICD-10-CM

## 2021-03-31 DIAGNOSIS — Z6838 Body mass index (BMI) 38.0-38.9, adult: Secondary | ICD-10-CM | POA: Diagnosis not present

## 2021-03-31 DIAGNOSIS — Z7982 Long term (current) use of aspirin: Secondary | ICD-10-CM | POA: Diagnosis not present

## 2021-03-31 LAB — BASIC METABOLIC PANEL
Anion gap: 9 (ref 5–15)
BUN: 10 mg/dL (ref 8–23)
CO2: 31 mmol/L (ref 22–32)
Calcium: 8.8 mg/dL — ABNORMAL LOW (ref 8.9–10.3)
Chloride: 90 mmol/L — ABNORMAL LOW (ref 98–111)
Creatinine, Ser: 0.79 mg/dL (ref 0.61–1.24)
GFR, Estimated: >60 mL/min (ref >60)
Glucose, Bld: 128 mg/dL — ABNORMAL HIGH (ref 70–99)
Potassium: 3.8 mmol/L (ref 3.5–5.1)
Sodium: 130 mmol/L — ABNORMAL LOW (ref 135–145)

## 2021-03-31 LAB — CBC
HCT: 43.7 % (ref 39.0–52.0)
Hemoglobin: 13.9 g/dL (ref 13.0–17.0)
MCH: 29 pg (ref 26.0–34.0)
MCHC: 31.8 g/dL (ref 30.0–36.0)
MCV: 91.2 fL (ref 80.0–100.0)
Platelets: 237 10*3/uL (ref 150–400)
RBC: 4.79 MIL/uL (ref 4.22–5.81)
RDW: 14.3 % (ref 11.5–15.5)
WBC: 11.6 10*3/uL — ABNORMAL HIGH (ref 4.0–10.5)
nRBC: 0 % (ref 0.0–0.2)

## 2021-03-31 LAB — ECHOCARDIOGRAM COMPLETE
Area-P 1/2: 3.37 cm2
Height: 69 in
S' Lateral: 3.5 cm
Weight: 3920 oz

## 2021-03-31 LAB — CBG MONITORING, ED
Glucose-Capillary: 124 mg/dL — ABNORMAL HIGH (ref 70–99)
Glucose-Capillary: 217 mg/dL — ABNORMAL HIGH (ref 70–99)

## 2021-03-31 LAB — RESP PANEL BY RT-PCR (FLU A&B, COVID) ARPGX2
Influenza A by PCR: NEGATIVE
Influenza B by PCR: NEGATIVE
SARS Coronavirus 2 by RT PCR: NEGATIVE

## 2021-03-31 LAB — TROPONIN I (HIGH SENSITIVITY)
Troponin I (High Sensitivity): 18 ng/L — ABNORMAL HIGH (ref <18)
Troponin I (High Sensitivity): 18 ng/L — ABNORMAL HIGH (ref <18)

## 2021-03-31 LAB — BRAIN NATRIURETIC PEPTIDE: B Natriuretic Peptide: 503.2 pg/mL — ABNORMAL HIGH (ref 0.0–100.0)

## 2021-03-31 LAB — GLUCOSE, CAPILLARY
Glucose-Capillary: 99 mg/dL (ref 70–99)
Glucose-Capillary: 110 mg/dL — ABNORMAL HIGH (ref 70–99)

## 2021-03-31 LAB — MRSA PCR SCREENING: MRSA by PCR: NEGATIVE

## 2021-03-31 LAB — D-DIMER, QUANTITATIVE: D-Dimer, Quant: 0.69 ug/mL-FEU — ABNORMAL HIGH (ref 0.00–0.50)

## 2021-03-31 LAB — HIV ANTIBODY (ROUTINE TESTING W REFLEX): HIV Screen 4th Generation wRfx: NONREACTIVE

## 2021-03-31 MED ORDER — ACETAMINOPHEN 650 MG RE SUPP: 650.0000 mg | Freq: Four times a day (QID) | RECTAL | Status: DC | PRN

## 2021-03-31 MED ORDER — ONDANSETRON HCL 4 MG PO TABS: 4.0000 mg | ORAL_TABLET | Freq: Four times a day (QID) | ORAL | Status: DC | PRN

## 2021-03-31 MED ORDER — FUROSEMIDE 10 MG/ML IJ SOLN
40.0000 mg | INTRAMUSCULAR | Status: AC
  Administered 2021-03-31: 40 mg via INTRAVENOUS
  Filled 2021-03-31: qty 4

## 2021-03-31 MED ORDER — LISINOPRIL 40 MG PO TABS
40.0000 mg | ORAL_TABLET | Freq: Every day | ORAL | Status: DC
  Administered 2021-03-31 – 2021-04-03 (×4): 40 mg via ORAL
  Filled 2021-03-31: qty 1
  Filled 2021-03-31: qty 2
  Filled 2021-03-31 (×2): qty 1

## 2021-03-31 MED ORDER — FUROSEMIDE 10 MG/ML IJ SOLN
40.0000 mg | Freq: Two times a day (BID) | INTRAMUSCULAR | Status: DC
  Administered 2021-03-31 – 2021-04-02 (×4): 40 mg via INTRAVENOUS
  Filled 2021-03-31 (×5): qty 4

## 2021-03-31 MED ORDER — ALBUTEROL SULFATE (2.5 MG/3ML) 0.083% IN NEBU: 2.5000 mg | INHALATION_SOLUTION | RESPIRATORY_TRACT | Status: DC | PRN

## 2021-03-31 MED ORDER — SODIUM CHLORIDE 0.9% FLUSH: 3.0000 mL | INTRAVENOUS | Status: DC | PRN

## 2021-03-31 MED ORDER — IOHEXOL 350 MG/ML SOLN
77.0000 mL | Freq: Once | INTRAVENOUS | Status: AC | PRN
  Administered 2021-03-31: 77 mL via INTRAVENOUS

## 2021-03-31 MED ORDER — ONDANSETRON HCL 4 MG/2ML IJ SOLN: 4.0000 mg | Freq: Four times a day (QID) | INTRAMUSCULAR | Status: DC | PRN

## 2021-03-31 MED ORDER — ASPIRIN EC 81 MG PO TBEC
81.0000 mg | DELAYED_RELEASE_TABLET | Freq: Every day | ORAL | Status: DC
  Administered 2021-03-31 – 2021-04-08 (×9): 81 mg via ORAL
  Filled 2021-03-31 (×9): qty 1

## 2021-03-31 MED ORDER — ATORVASTATIN CALCIUM 80 MG PO TABS
80.0000 mg | ORAL_TABLET | Freq: Every day | ORAL | Status: DC
  Administered 2021-03-31 – 2021-04-08 (×9): 80 mg via ORAL
  Filled 2021-03-31 (×3): qty 1
  Filled 2021-03-31: qty 2
  Filled 2021-03-31 (×5): qty 1

## 2021-03-31 MED ORDER — INSULIN ASPART 100 UNIT/ML IJ SOLN
0.0000 [IU] | Freq: Three times a day (TID) | INTRAMUSCULAR | Status: DC
  Administered 2021-03-31: 1 [IU] via SUBCUTANEOUS
  Administered 2021-03-31: 3 [IU] via SUBCUTANEOUS
  Administered 2021-04-01: 2 [IU] via SUBCUTANEOUS
  Administered 2021-04-02: 1 [IU] via SUBCUTANEOUS
  Administered 2021-04-02: 5 [IU] via SUBCUTANEOUS
  Administered 2021-04-03 (×2): 2 [IU] via SUBCUTANEOUS
  Administered 2021-04-04: 3 [IU] via SUBCUTANEOUS
  Administered 2021-04-05: 1 [IU] via SUBCUTANEOUS
  Administered 2021-04-05: 3 [IU] via SUBCUTANEOUS
  Administered 2021-04-05: 1 [IU] via SUBCUTANEOUS
  Administered 2021-04-06: 2 [IU] via SUBCUTANEOUS
  Administered 2021-04-06: 3 [IU] via SUBCUTANEOUS
  Administered 2021-04-08: 2 [IU] via SUBCUTANEOUS

## 2021-03-31 MED ORDER — CARVEDILOL 25 MG PO TABS
25.0000 mg | ORAL_TABLET | Freq: Two times a day (BID) | ORAL | Status: DC
  Administered 2021-04-01 – 2021-04-03 (×3): 25 mg via ORAL
  Filled 2021-03-31 (×3): qty 1

## 2021-03-31 MED ORDER — SODIUM CHLORIDE 0.9% FLUSH
3.0000 mL | Freq: Two times a day (BID) | INTRAVENOUS | Status: DC
  Administered 2021-03-31 – 2021-04-07 (×14): 3 mL via INTRAVENOUS

## 2021-03-31 MED ORDER — ADULT MULTIVITAMIN W/MINERALS CH
1.0000 | ORAL_TABLET | Freq: Every day | ORAL | Status: DC
  Administered 2021-03-31 – 2021-04-08 (×9): 1 via ORAL
  Filled 2021-03-31 (×9): qty 1

## 2021-03-31 MED ORDER — ENOXAPARIN SODIUM 40 MG/0.4ML IJ SOSY: 40.0000 mg | PREFILLED_SYRINGE | INTRAMUSCULAR | Status: DC

## 2021-03-31 MED ORDER — ENOXAPARIN SODIUM 60 MG/0.6ML IJ SOSY
0.5000 mg/kg | PREFILLED_SYRINGE | INTRAMUSCULAR | Status: DC
  Administered 2021-03-31 – 2021-04-07 (×8): 55 mg via SUBCUTANEOUS
  Filled 2021-03-31 (×8): qty 0.6

## 2021-03-31 MED ORDER — SODIUM CHLORIDE 0.9 % IV SOLN: 250.0000 mL | INTRAVENOUS | Status: DC | PRN

## 2021-03-31 MED ORDER — ACETAMINOPHEN 325 MG PO TABS
650.0000 mg | ORAL_TABLET | Freq: Four times a day (QID) | ORAL | Status: DC | PRN
  Administered 2021-04-03 – 2021-04-05 (×2): 650 mg via ORAL
  Filled 2021-03-31 (×3): qty 2

## 2021-03-31 MED ORDER — INSULIN ASPART 100 UNIT/ML IJ SOLN
0.0000 [IU] | Freq: Every day | INTRAMUSCULAR | Status: DC
  Administered 2021-04-01: 2 [IU] via SUBCUTANEOUS

## 2021-03-31 NOTE — Progress Notes (Signed)
*  PRELIMINARY RESULTS* Echocardiogram 2D Echocardiogram has been performed.  Nathan Miller 03/31/2021, 3:53 PM

## 2021-03-31 NOTE — ED Notes (Signed)
Charge RN on 2W reviewing patient chart; will call be back.

## 2021-03-31 NOTE — Plan of Care (Signed)

## 2021-03-31 NOTE — H&P (Signed)
History and Physical    Nathan Miller ION:[id number] DOB: 08-12-1951 DOA: 03/30/2021  PCP: [facility]   Patient coming from:  Home  Chief Complaint: SOB, chest pain  HPI: Nathan Miller is a 70 y.o. male with medical history significant for CAD, heart block s/p implanted pacemaker, DM, CHF who presents by EMS for evaluation of SOB with chest pain. He reports he laid down for a nap around 1 pm on 03/30/21 and when he woke up he had SOB, chills and chest pain. He reports a fever of 101 F. He states he did not feel well so his wife called EMS.  He does not use supplemental oxygen at home.  In the emergency room he is requiring 3 L by nasal cannula to maintain O2 sats.  He states he is not sure if he has been gaining weight.  He reports some chronic mild edema of his ankles which he does not think is changed.  He reports a chest pain that he describes as a pressure in his central chest that does not radiate.  He reports he has not had any nausea or vomiting.  He reports when he first woke up and was laying flat he felt very short of breath and after he sat up and walked he continued to have the shortness of breath and chest pain became worse.  He did not take a nitroglycerin that he had at home.  Wife states that he does not look pale or diaphoretic.  He had normal speech.  No recent sick contacts and no known COVID-19 exposures.  He has been vaccinated for COVID-19. He has a history of smoking but quit over 20 years ago.  ED Course: Nathan Miller is requiring no oxygen to keep O2 sats above 92% in the emergency room.  Blood pressure has been 130-162/52-83.  He is currently sitting up on the side of the gurney is able to talk in full sentences.  D-dimer is elevated at 0.69.  COVID swab is negative.  Influenza A and influenza B are negative.  WBC is 15,200.  Hemoglobin 14.6, hematocrit 45.0, platelets 237,000.  Troponin was mildly elevated at 20.  BNP mildly elevated at 503.2.   Creatinine is 0.75 with a BUN of 8.  Sodium is 131, potassium 3.7, chloride 90, bicarb 28, glucose 143.  Chest x-ray did not reveal any infiltrate or consolidations.  He did have mild bibasilar atelectasis.  CT angiography of the chest been ordered with elevated D-dimer level and hypoxia requiring oxygen supplementation.  Hospitalist service has been asked to admit for further management  Review of Systems:  General: Reports fever, chills. Denies weight loss, night sweats. Denies dizziness. Reports decreased appetite HENT: Denies head trauma, headache, denies change in hearing, tinnitus. Denies nasal bleeding. Denies sore throat, sores in mouth.  Denies difficulty swallowing Eyes: Denies blurry vision, pain in eye, drainage.  Denies discoloration of eyes. Neck: Denies pain.  Denies swelling.  Denies pain with movement. Cardiovascular: Reports chest pain. Denies palpitations. Has mild leg edema.  Denies orthopnea Respiratory: Reports shortness of breath, cough.  Denies wheezing.  Denies sputum production Gastrointestinal: Denies abdominal pain, swelling.  Denies nausea, vomiting, diarrhea.  Denies melena.  Denies hematemesis. Musculoskeletal: Denies limitation of movement.  Denies deformity or swelling.  Denies pain.  Denies arthralgias or myalgias. Genitourinary: Denies pelvic pain.  Denies urinary frequency or hesitancy.  Denies dysuria.  Skin: Denies rash.  Denies petechiae, purpura, ecchymosis. Neurological: Denies headache. Denies  syncope. Denies seizure activity. Denies paresthesia. Denies slurred speech, drooping face. Denies visual change. Psychiatric: Denies depression, anxiety. Denies hallucinations.  Past Medical History:  Diagnosis Date  . CAD (coronary artery disease)    NSTEMI in 2001. Cardiac cath showed: LAD: 40% proximal, LCX: 70% mid, RCA thrombotic occlusion in mid segment. s/p PCI and 2 overlapped BMSs to RCA.   [name] CHF (congestive heart failure) (HCC)   . Diabetes mellitus   .  Hypertension   . Osteomyelitis [facility])     Past Surgical History:  Procedure Laterality Date  . CARDIAC CATHETERIZATION    . CORONARY ANGIOPLASTY    . PERMANENT PACEMAKER INSERTION N/A 01/22/2015   MDT Advisa pacemaker implanted for complete heart block by Dr Johney Frame  . TEMPORARY PACEMAKER INSERTION N/A 01/21/2015   Procedure: TEMPORARY PACEMAKER INSERTION;  Surgeon: Marykay Lex, MD;  Location: [facility] CATH LAB;  Service: Cardiovascular;  Laterality: N/A;    Social History  reports that he quit smoking about 13 years ago. His smoking use included cigarettes. He started smoking about 52 years ago. He has a 12.00 pack-year smoking history. His smokeless tobacco use includes snuff. He reports current alcohol use of about 42.0 standard drinks of alcohol per week. He reports that he does not use drugs.  Allergies  Allergen Reactions  . Penicillins Shortness Of Breath    Did it involve swelling of the face/tongue/throat, SOB, or low BP? Y Did it involve sudden or severe rash/hives, skin peeling, or any reaction on the inside of your mouth or nose? Y Did you need to seek medical attention at a hospital or doctor's office? N When did it last happen?Several decades ago. If all above answers are "NO", may proceed with cephalosporin use.     Family History  Problem Relation Age of Onset  . Heart disease Father   . COPD Father   . Heart disease Paternal Grandfather      Prior to Admission medications   Medication Sig Start Date End Date Taking? Authorizing Provider  aspirin EC 81 MG tablet Take 1 tablet (81 mg total) by mouth daily. 01/25/15  Yes Short, Thea Silversmith, MD  atorvastatin (LIPITOR) 80 MG tablet Take 1 tablet (80 mg total) by mouth daily. 05/04/17  Yes Allred, Fayrene Fearing, MD  carvedilol (COREG) 25 MG tablet Take 1 tablet (25 mg total) by mouth 2 (two) times daily. 05/04/17  Yes Allred, Fayrene Fearing, MD  lisinopril (ZESTRIL) 40 MG tablet Take 1 tablet (40 mg total) by mouth daily. 06/04/20  Yes  Allred, Fayrene Fearing, MD  metFORMIN (GLUCOPHAGE) 500 MG tablet Take 1 tablet (500 mg total) by mouth 2 (two) times daily with a meal. 01/25/15  Yes Short, Thea Silversmith, MD  Multiple Vitamins-Minerals (CENTRUM ADULTS) TABS Take 1 tablet by mouth daily.   Yes [provider]  nitroGLYCERIN (NITROSTAT) 0.4 MG SL tablet Place 1 tablet (0.4 mg total) under the tongue every 5 (five) minutes as needed for chest pain. 01/25/15  Gunnar Fusi, MD    Physical Exam: Vitals:   03/31/21 0000 03/31/21 0100 03/31/21 0130 03/31/21 0200  BP: (!) 137/52 (!) 143/62 129/69 132/62  Pulse: (!) 49 (!) 51 (!) 50 (!) 41  Resp: (!) 23 (!) 22 (!) 27 (!) 21  Temp:      TempSrc:      SpO2: 95% 96% 96% (!) 87%  Weight:      Height:        Constitutional: NAD, calm, comfortable Vitals:   03/31/21 0000 03/31/21  0100 03/31/21 0130 03/31/21 0200  BP: (!) 137/52 (!) 143/62 129/69 132/62  Pulse: (!) 49 (!) 51 (!) 50 (!) 41  Resp: (!) 23 (!) 22 (!) 27 (!) 21  Temp:      TempSrc:      SpO2: 95% 96% 96% (!) 87%  Weight:      Height:       General: WDWN, Alert and oriented x3.  Eyes: EOMI, PERRL, conjunctivae normal.  Sclera nonicteric HENT:  [place]/AT, external ears normal.  Nares patent without epistasis. Mucous membranes are moist. Posterior pharynx clear of any exudate or lesions. Neck: Soft, normal range of motion, supple, no masses, no thyromegaly. Trachea midline Respiratory: Diminished breath sounds bilaterally. No rhonchi. no wheezing, no crackles. Normal respiratory effort. No accessory muscle use.  Cardiovascular: Regular rate and rhythm, no murmurs / rubs / gallops. Trace pedal edema. 1+ pedal pulses. Pacemaker in left chest. Abdomen: Soft, no tenderness, nondistended, no rebound or guarding. No masses palpated. Bowel sounds normoactive Musculoskeletal: FROM. no clubbing / cyanosis. No joint deformity upper and lower extremities. Normal muscle tone. Right leg and foot in walking brace.  Skin: Warm, dry,  intact no rashes, lesions, ulcers. No induration Neurologic: CN 2-12 grossly intact.  Normal speech.  Sensation intact, Strength 5/5 in all extremities.   Psychiatric: Normal judgment and insight.  Normal mood.    Labs on Admission: I have personally reviewed following labs and imaging studies  CBC: Recent Labs  Lab 03/30/21 2239  WBC 15.2*  NEUTROABS 13.2*  HGB 14.6  HCT 45.0  MCV 90.9  PLT 237    Basic Metabolic Panel: Recent Labs  Lab 03/30/21 2239  NA 131*  K 3.7  CL 90*  CO2 28  GLUCOSE 143*  BUN 8  CREATININE 0.75  CALCIUM 8.8*    GFR: Estimated Creatinine Clearance: 109.2 mL/min (by C-G formula based on SCr of 0.75 mg/dL).  Liver Function Tests: No results for input(s): AST, ALT, ALKPHOS, BILITOT, PROT, ALBUMIN in the last 168 hours.  Urine analysis:    Component Value Date/Time   COLORURINE YELLOW 05/21/2019 2040   APPEARANCEUR CLEAR 05/21/2019 2040   LABSPEC 1.004 (L) 05/21/2019 2040   PHURINE 7.0 05/21/2019 2040   GLUCOSEU NEGATIVE 05/21/2019 2040   HGBUR NEGATIVE 05/21/2019 2040   BILIRUBINUR NEGATIVE 05/21/2019 2040   KETONESUR NEGATIVE 05/21/2019 2040   PROTEINUR NEGATIVE 05/21/2019 2040   UROBILINOGEN 1.0 06/13/2015 2104   NITRITE NEGATIVE 05/21/2019 2040   LEUKOCYTESUR NEGATIVE 05/21/2019 2040    Radiological Exams on Admission: DG Chest Port 1 View  Result Date: 03/30/2021 CLINICAL DATA:  Shortness of breath, chills EXAM: PORTABLE CHEST 1 VIEW COMPARISON:  10/30/2020 FINDINGS: Left pacer remains in place, unchanged. Heart is borderline in size. Mild peribronchial thickening. Increased markings at the lung bases, likely atelectasis or scarring, similar to prior study. No effusions. No acute bony abnormality. IMPRESSION: Mild bronchitic changes. Bibasilar atelectasis or scarring. Electronically Signed   By: Charlett Nose M.D.   On: 03/30/2021 22:54   CUP PACEART REMOTE DEVICE CHECK  Result Date: 03/30/2021 Scheduled remote reviewed. Normal  device function.  Next remote 91 days. HB   EKG: Independently reviewed.  EKG shows a ventricular paced rhythm.  Patient has complete AV block.  No acute ST elevation or depression.  QTc 459  Assessment/Plan Principal Problem:   Acute respiratory failure with hypoxia  Nathan Miller is admitted to Progressive care floor. He is requiring oxygen by n/c to maintain O2 sat between  92-96%. He does not use oxygen at home. Albuterol neb treatments as needed for SOB, wheezing, cough.  Obtain CTA chest with elevated D-dimer level to make sure no PE present.  RT to follow Incentive spirometer every two hours while awake.   Active Problems:   Essential hypertension Continue home medication of Coreg, lisinopril. Monitor BP    Diabetes type 2, controlled  Pt is on metformin at home. Metformin held for 48 hours after receiving IV contrast for CTA chest.  Monitor blood sugars with meals and bedtime. SSI as needed for glycemic control. Check HgbA1c level.     Elevated d-dimer CTA chest to evaluate for PE as etiology of symptoms.     Elevated brain natriuretic peptide (BNP) level Check serial troponin levels.  Check echocardiogram in am to evaluate wall motion, EF, valvular function. Last Echo was in 2016.    Complete heart block  Has pacemaker    Cardiac pacemaker in situ   CAD S/P percutaneous coronary angioplasty; bms X 2 to RCA 2001   Obesity with body mass index (BMI) of 30.0 to 39.9 Chronic    DVT prophylaxis: Lovenox for DVT prophylaxis. Will increase to therapeutic anticoagulation if PE is identified Code Status:   Full code Family Communication:  Diagnosis and plan discussed with patient and his wife who is at bedside.  Questions were answered.  They agree with plan.  Further recommendations to follow as clinically indicated Disposition Plan:   Patient is from:  Home  Anticipated DC to:  Home  Anticipated DC date:  Anticipate 2 midnight or more stay in hospital to treat  Anticipated  DC barriers: No barriers to discharge identified at this time   Admission status:  Inpatient  Nathan Severance Miciah Shealy MD [facility]  How to contact the [facility] Attending or Consulting provider 7A - 7P or covering provider during after hours 7P -7A, for this patient?   1. Check the care team in [facility] and look for a) attending/consulting [facility] provider listed and b) the [facility] team listed 2. Log into [email or web address] and use [facility] universal password to access. If you do not have the password, please contact the hospital operator. 3. Locate the [facility] provider you are looking for under [facility] and page to a number that you can be directly reached. 4. If you still have difficulty reaching the provider, please page the [facility] (Director on Call) for the Hospitalists listed on amion for assistance.  03/31/2021, 3:03 AM

## 2021-03-31 NOTE — Progress Notes (Signed)
[id number]    Height:       69.0 in Accession #:    [id number]   Weight:       245.0 lb Date of Birth:  03-Oct-1951   BSA:          2.252 m Patient Age:    70 years     BP:           123/61 mmHg Patient Gender: M            HR:           69 bpm. Exam Location:  Inpatient Procedure: 2D Echo Indications:    Dyspnea R06.00  History:        Patient has prior history of Echocardiogram examinations, most                 recent 01/23/2015. CAD, Signs/Symptoms:Syncope; Risk                 Factors:Hypertension, Dyslipidemia and Former Smoker.                 Bradycardia, 2nd degree AV block, CHB.  Sonographer:    Leavy Cella Referring Phys: [id number] [place]  1. Left ventricular ejection fraction, by estimation, is 55 to 60%. The left ventricle has normal  function. Left ventricular endocardial border not optimally defined to evaluate regional wall motion. There is severe concentric left ventricular hypertrophy. Left ventricular diastolic parameters are consistent with Grade II diastolic dysfunction (pseudonormalization). Elevated left ventricular end-diastolic pressure.  2. Right ventricular systolic function is normal. The right ventricular size is normal. Tricuspid regurgitation signal is inadequate for assessing PA pressure.  3. Left atrial size was mildly dilated.  4. Right atrial size was mildly dilated.  5. The mitral valve is normal in structure. No evidence of mitral valve regurgitation. No evidence of mitral stenosis.  6. The aortic valve was not well visualized. Aortic valve regurgitation is not visualized.  7. The inferior vena cava is normal in size with greater than 50% respiratory variability, suggesting right atrial pressure of 3 mmHg. FINDINGS  Left Ventricle: Left ventricular ejection fraction, by estimation, is 55 to 60%. The left ventricle has normal function. Left ventricular endocardial border not optimally defined to evaluate regional wall motion. The left ventricular internal cavity size was normal in size. There is severe concentric left ventricular hypertrophy. Left ventricular diastolic parameters are consistent with Grade II diastolic dysfunction (pseudonormalization). Elevated left ventricular end-diastolic pressure. Right Ventricle: The right ventricular size is normal. No increase in right ventricular wall thickness. Right ventricular systolic function is normal. Tricuspid regurgitation signal is inadequate for assessing PA pressure. Left Atrium: Left atrial size was mildly dilated. Right Atrium: Right atrial size was mildly dilated. Pericardium: There is no evidence of pericardial effusion. Mitral Valve: The mitral valve is normal in structure. No evidence of mitral valve regurgitation. No evidence of mitral valve stenosis. Tricuspid  Valve: The tricuspid valve is normal in structure. Tricuspid valve regurgitation is not demonstrated. No evidence of tricuspid stenosis. Aortic Valve: The aortic valve was not well visualized. Aortic valve regurgitation is not visualized. Pulmonic Valve: The pulmonic valve was not well visualized. Pulmonic valve regurgitation is not visualized. No evidence of pulmonic stenosis. Aorta: The aortic root is normal in size and structure. Venous: The inferior vena cava is normal in size with greater than 50% respiratory variability, suggesting right atrial pressure of 3 mmHg. IAS/Shunts: No atrial level shunt detected by color flow Doppler.  PROGRESS NOTE    KANE KUSEK  WNI:[id number] DOB: Feb 03, 1951 DOA: 03/30/2021 PCP: [facility]    Brief Narrative:  70 y.o. male with medical history significant for CAD, heart block s/p implanted pacemaker, DM, CHF who presents by EMS for evaluation of SOB with chest pain. He reports he laid down for a nap around 1 pm on 03/30/21 and when he woke up he had SOB, chills and chest pain. Claimed to have fevers prior to visit. Pt initially required 3LNC in ED, improved after one dose of IV lasix  Assessment & Plan:   Principal Problem:   Acute respiratory failure with hypoxia ([place]) Active Problems:   Essential hypertension   CAD S/P percutaneous coronary angioplasty; bms X 2 to RCA 2001   Complete heart block (HCC)   Diabetes type 2, controlled (HCC)   Cardiac pacemaker in situ   Obesity with body mass index (BMI) of 30.0 to 39.9   Elevated d-dimer   Elevated brain natriuretic peptide (BNP) level  Acute respiratory failure with hypoxia secondary to acute diastolic CHF exacerbation, chronicity is unknown since this is new diagnosis -CTA reviewed, neg for PE -Pt noted to have BNP of 503 on presentation -2d echo performed and reviewed. Findings of normal LVEF with grade 2 diastolic dysfunction -Pt noted to have marked improvement after one dose of IV lasix overnight -On ambulation, desaturates to mid-70's -Will continue IV lasix 40mg  bid -Follow I/o and daily wts -recheck bmet in AM    Essential hypertension -Continue home medication of Coreg, lisinopril.    Diabetes type 2, controlled  -Pt is on metformin at home. -continue SSI coverage while in hospital -.a1c is pending    Elevated d-dimer -CTA reviewed, neg for PE    Complete heart block  -Has pacemaker in place    Cardiac pacemaker in situ  - CAD S/P percutaneous coronary angioplasty; bms X 2 to RCA 2001   Obesity with body mass index (BMI) of 30.0 to 39.9 Chronic  DVT prophylaxis:  Lovenox subq Code Status: Full Family Communication: pt in room, family not at bedside  Status is: Inpatient  Remains inpatient appropriate because:Inpatient level of care appropriate due to severity of illness   Dispo: The patient is from: Home              Anticipated d/c is to: Home              Patient currently is not medically stable to d/c.   Difficult to place patient No       Consultants:     Procedures:     Antimicrobials: Anti-infectives (From admission, onward)   None       Subjective: Reports feeling better after dose of lasix this AM, eager to go home soon  Objective: Vitals:   03/31/21 1045 03/31/21 1153 03/31/21 1245 03/31/21 1320  BP: 131/62 (!) 158/72 (!) 112/43 123/61  Pulse: (!) 50 (!) 50 (!) 50 69  Resp: 14 15 (!) 22 18  Temp:  97.6 F (36.4 C) 97.6 F (36.4 C) 98.3 F (36.8 C)  TempSrc:  Oral Oral Oral  SpO2: 98% 98% 99% 100%  Weight:      Height:    5\' 9"  (1.753 m)   No intake or output data in the 24 hours ending 03/31/21 1734 Filed Weights   03/30/21 2226  Weight: 111.1 kg    Examination: General exam: Awake, sitting in chair Respiratory system: Normal respiratory effort, no wheezing  [id number]    Height:       69.0 in Accession #:    [id number]   Weight:       245.0 lb Date of Birth:  03-Oct-1951   BSA:          2.252 m Patient Age:    70 years     BP:           123/61 mmHg Patient Gender: M            HR:           69 bpm. Exam Location:  Inpatient Procedure: 2D Echo Indications:    Dyspnea R06.00  History:        Patient has prior history of Echocardiogram examinations, most                 recent 01/23/2015. CAD, Signs/Symptoms:Syncope; Risk                 Factors:Hypertension, Dyslipidemia and Former Smoker.                 Bradycardia, 2nd degree AV block, CHB.  Sonographer:    Leavy Cella Referring Phys: [id number] [place]  1. Left ventricular ejection fraction, by estimation, is 55 to 60%. The left ventricle has normal  function. Left ventricular endocardial border not optimally defined to evaluate regional wall motion. There is severe concentric left ventricular hypertrophy. Left ventricular diastolic parameters are consistent with Grade II diastolic dysfunction (pseudonormalization). Elevated left ventricular end-diastolic pressure.  2. Right ventricular systolic function is normal. The right ventricular size is normal. Tricuspid regurgitation signal is inadequate for assessing PA pressure.  3. Left atrial size was mildly dilated.  4. Right atrial size was mildly dilated.  5. The mitral valve is normal in structure. No evidence of mitral valve regurgitation. No evidence of mitral stenosis.  6. The aortic valve was not well visualized. Aortic valve regurgitation is not visualized.  7. The inferior vena cava is normal in size with greater than 50% respiratory variability, suggesting right atrial pressure of 3 mmHg. FINDINGS  Left Ventricle: Left ventricular ejection fraction, by estimation, is 55 to 60%. The left ventricle has normal function. Left ventricular endocardial border not optimally defined to evaluate regional wall motion. The left ventricular internal cavity size was normal in size. There is severe concentric left ventricular hypertrophy. Left ventricular diastolic parameters are consistent with Grade II diastolic dysfunction (pseudonormalization). Elevated left ventricular end-diastolic pressure. Right Ventricle: The right ventricular size is normal. No increase in right ventricular wall thickness. Right ventricular systolic function is normal. Tricuspid regurgitation signal is inadequate for assessing PA pressure. Left Atrium: Left atrial size was mildly dilated. Right Atrium: Right atrial size was mildly dilated. Pericardium: There is no evidence of pericardial effusion. Mitral Valve: The mitral valve is normal in structure. No evidence of mitral valve regurgitation. No evidence of mitral valve stenosis. Tricuspid  Valve: The tricuspid valve is normal in structure. Tricuspid valve regurgitation is not demonstrated. No evidence of tricuspid stenosis. Aortic Valve: The aortic valve was not well visualized. Aortic valve regurgitation is not visualized. Pulmonic Valve: The pulmonic valve was not well visualized. Pulmonic valve regurgitation is not visualized. No evidence of pulmonic stenosis. Aorta: The aortic root is normal in size and structure. Venous: The inferior vena cava is normal in size with greater than 50% respiratory variability, suggesting right atrial pressure of 3 mmHg. IAS/Shunts: No atrial level shunt detected by color flow Doppler.  [id number]    Height:       69.0 in Accession #:    [id number]   Weight:       245.0 lb Date of Birth:  03-Oct-1951   BSA:          2.252 m Patient Age:    70 years     BP:           123/61 mmHg Patient Gender: M            HR:           69 bpm. Exam Location:  Inpatient Procedure: 2D Echo Indications:    Dyspnea R06.00  History:        Patient has prior history of Echocardiogram examinations, most                 recent 01/23/2015. CAD, Signs/Symptoms:Syncope; Risk                 Factors:Hypertension, Dyslipidemia and Former Smoker.                 Bradycardia, 2nd degree AV block, CHB.  Sonographer:    Leavy Cella Referring Phys: [id number] [place]  1. Left ventricular ejection fraction, by estimation, is 55 to 60%. The left ventricle has normal  function. Left ventricular endocardial border not optimally defined to evaluate regional wall motion. There is severe concentric left ventricular hypertrophy. Left ventricular diastolic parameters are consistent with Grade II diastolic dysfunction (pseudonormalization). Elevated left ventricular end-diastolic pressure.  2. Right ventricular systolic function is normal. The right ventricular size is normal. Tricuspid regurgitation signal is inadequate for assessing PA pressure.  3. Left atrial size was mildly dilated.  4. Right atrial size was mildly dilated.  5. The mitral valve is normal in structure. No evidence of mitral valve regurgitation. No evidence of mitral stenosis.  6. The aortic valve was not well visualized. Aortic valve regurgitation is not visualized.  7. The inferior vena cava is normal in size with greater than 50% respiratory variability, suggesting right atrial pressure of 3 mmHg. FINDINGS  Left Ventricle: Left ventricular ejection fraction, by estimation, is 55 to 60%. The left ventricle has normal function. Left ventricular endocardial border not optimally defined to evaluate regional wall motion. The left ventricular internal cavity size was normal in size. There is severe concentric left ventricular hypertrophy. Left ventricular diastolic parameters are consistent with Grade II diastolic dysfunction (pseudonormalization). Elevated left ventricular end-diastolic pressure. Right Ventricle: The right ventricular size is normal. No increase in right ventricular wall thickness. Right ventricular systolic function is normal. Tricuspid regurgitation signal is inadequate for assessing PA pressure. Left Atrium: Left atrial size was mildly dilated. Right Atrium: Right atrial size was mildly dilated. Pericardium: There is no evidence of pericardial effusion. Mitral Valve: The mitral valve is normal in structure. No evidence of mitral valve regurgitation. No evidence of mitral valve stenosis. Tricuspid  Valve: The tricuspid valve is normal in structure. Tricuspid valve regurgitation is not demonstrated. No evidence of tricuspid stenosis. Aortic Valve: The aortic valve was not well visualized. Aortic valve regurgitation is not visualized. Pulmonic Valve: The pulmonic valve was not well visualized. Pulmonic valve regurgitation is not visualized. No evidence of pulmonic stenosis. Aorta: The aortic root is normal in size and structure. Venous: The inferior vena cava is normal in size with greater than 50% respiratory variability, suggesting right atrial pressure of 3 mmHg. IAS/Shunts: No atrial level shunt detected by color flow Doppler.  [id number]    Height:       69.0 in Accession #:    [id number]   Weight:       245.0 lb Date of Birth:  03-Oct-1951   BSA:          2.252 m Patient Age:    70 years     BP:           123/61 mmHg Patient Gender: M            HR:           69 bpm. Exam Location:  Inpatient Procedure: 2D Echo Indications:    Dyspnea R06.00  History:        Patient has prior history of Echocardiogram examinations, most                 recent 01/23/2015. CAD, Signs/Symptoms:Syncope; Risk                 Factors:Hypertension, Dyslipidemia and Former Smoker.                 Bradycardia, 2nd degree AV block, CHB.  Sonographer:    Leavy Cella Referring Phys: [id number] [place]  1. Left ventricular ejection fraction, by estimation, is 55 to 60%. The left ventricle has normal  function. Left ventricular endocardial border not optimally defined to evaluate regional wall motion. There is severe concentric left ventricular hypertrophy. Left ventricular diastolic parameters are consistent with Grade II diastolic dysfunction (pseudonormalization). Elevated left ventricular end-diastolic pressure.  2. Right ventricular systolic function is normal. The right ventricular size is normal. Tricuspid regurgitation signal is inadequate for assessing PA pressure.  3. Left atrial size was mildly dilated.  4. Right atrial size was mildly dilated.  5. The mitral valve is normal in structure. No evidence of mitral valve regurgitation. No evidence of mitral stenosis.  6. The aortic valve was not well visualized. Aortic valve regurgitation is not visualized.  7. The inferior vena cava is normal in size with greater than 50% respiratory variability, suggesting right atrial pressure of 3 mmHg. FINDINGS  Left Ventricle: Left ventricular ejection fraction, by estimation, is 55 to 60%. The left ventricle has normal function. Left ventricular endocardial border not optimally defined to evaluate regional wall motion. The left ventricular internal cavity size was normal in size. There is severe concentric left ventricular hypertrophy. Left ventricular diastolic parameters are consistent with Grade II diastolic dysfunction (pseudonormalization). Elevated left ventricular end-diastolic pressure. Right Ventricle: The right ventricular size is normal. No increase in right ventricular wall thickness. Right ventricular systolic function is normal. Tricuspid regurgitation signal is inadequate for assessing PA pressure. Left Atrium: Left atrial size was mildly dilated. Right Atrium: Right atrial size was mildly dilated. Pericardium: There is no evidence of pericardial effusion. Mitral Valve: The mitral valve is normal in structure. No evidence of mitral valve regurgitation. No evidence of mitral valve stenosis. Tricuspid  Valve: The tricuspid valve is normal in structure. Tricuspid valve regurgitation is not demonstrated. No evidence of tricuspid stenosis. Aortic Valve: The aortic valve was not well visualized. Aortic valve regurgitation is not visualized. Pulmonic Valve: The pulmonic valve was not well visualized. Pulmonic valve regurgitation is not visualized. No evidence of pulmonic stenosis. Aorta: The aortic root is normal in size and structure. Venous: The inferior vena cava is normal in size with greater than 50% respiratory variability, suggesting right atrial pressure of 3 mmHg. IAS/Shunts: No atrial level shunt detected by color flow Doppler.

## 2021-03-31 NOTE — Progress Notes (Signed)
SATURATION QUALIFICATIONS: (This note is used to comply with regulatory documentation for home oxygen)  Patient Saturations on Room Air at Rest = 96%  Patient Saturations on Room Air while Ambulating = 75%  Patient Saturations on 6 Liters of oxygen while Ambulating = 91%  Please briefly explain why patient needs home oxygen: Pt walked in room from chair  (located at oxygen on wall site) to sink and rapidly decreased from 85% to 75% on 2nd round of walking. Pt was given oxygen by nasal cannula and after 5-10 seconds gradually begin to increase to 91% on 6L.

## 2021-04-01 LAB — HEMOGLOBIN A1C
Hgb A1c MFr Bld: 6.4 % — ABNORMAL HIGH (ref 4.8–5.6)
Mean Plasma Glucose: 137 mg/dL

## 2021-04-01 LAB — COMPREHENSIVE METABOLIC PANEL
ALT: 16 U/L (ref 0–44)
AST: 22 U/L (ref 15–41)
Albumin: 3.2 g/dL — ABNORMAL LOW (ref 3.5–5.0)
Alkaline Phosphatase: 104 U/L (ref 38–126)
Anion gap: 10 (ref 5–15)
BUN: 10 mg/dL (ref 8–23)
CO2: 31 mmol/L (ref 22–32)
Calcium: 8.8 mg/dL — ABNORMAL LOW (ref 8.9–10.3)
Chloride: 91 mmol/L — ABNORMAL LOW (ref 98–111)
Creatinine, Ser: 0.8 mg/dL (ref 0.61–1.24)
GFR, Estimated: >60 mL/min (ref >60)
Glucose, Bld: 109 mg/dL — ABNORMAL HIGH (ref 70–99)
Potassium: 3.4 mmol/L — ABNORMAL LOW (ref 3.5–5.1)
Sodium: 132 mmol/L — ABNORMAL LOW (ref 135–145)
Total Bilirubin: 2.6 mg/dL — ABNORMAL HIGH (ref 0.3–1.2)
Total Protein: 6.4 g/dL — ABNORMAL LOW (ref 6.5–8.1)

## 2021-04-01 LAB — CBC
HCT: 44.1 % (ref 39.0–52.0)
Hemoglobin: 14.5 g/dL (ref 13.0–17.0)
MCH: 29.4 pg (ref 26.0–34.0)
MCHC: 32.9 g/dL (ref 30.0–36.0)
MCV: 89.3 fL (ref 80.0–100.0)
Platelets: 165 10*3/uL (ref 150–400)
RBC: 4.94 MIL/uL (ref 4.22–5.81)
RDW: 14.3 % (ref 11.5–15.5)
WBC: 6.2 10*3/uL (ref 4.0–10.5)
nRBC: 0 % (ref 0.0–0.2)

## 2021-04-01 LAB — GLUCOSE, CAPILLARY
Glucose-Capillary: 118 mg/dL — ABNORMAL HIGH (ref 70–99)
Glucose-Capillary: 197 mg/dL — ABNORMAL HIGH (ref 70–99)
Glucose-Capillary: 117 mg/dL — ABNORMAL HIGH (ref 70–99)
Glucose-Capillary: 237 mg/dL — ABNORMAL HIGH (ref 70–99)

## 2021-04-01 LAB — MAGNESIUM: Magnesium: 1.7 mg/dL (ref 1.7–2.4)

## 2021-04-01 NOTE — Progress Notes (Signed)
Chronic lung disease with Emphysema (ICD10-J43.9) and scattered linear lung scarring or atelectasis. 3. Calcified coronary artery and Aortic Atherosclerosis (ICD10-I70.0). Electronically Signed   By: Genevie Ann M.D.   On: 03/31/2021 04:07   DG Chest Port 1 View  Result Date: 03/30/2021 CLINICAL DATA:  Shortness of breath, chills EXAM: PORTABLE CHEST 1 VIEW COMPARISON:  10/30/2020 FINDINGS: Left pacer remains in place, unchanged. Heart is borderline in size. Mild peribronchial thickening. Increased markings at the lung bases, likely atelectasis or scarring, similar to prior study. No effusions. No acute bony abnormality. IMPRESSION: Mild bronchitic changes. Bibasilar atelectasis or scarring. Electronically Signed   By: Rolm Baptise M.D.   On: 03/30/2021 22:54   ECHOCARDIOGRAM COMPLETE  Result Date: 03/31/2021    ECHOCARDIOGRAM REPORT   Patient Name:   Nathan Miller Date of Exam: 03/31/2021 Medical Rec #:  [id number]    Height:       69.0 in Accession #:    [id number]   Weight:       245.0 lb Date of Birth:  1951/02/06    BSA:          2.252 m Patient Age:    17 years     BP:           123/61 mmHg Patient Gender: M            HR:           69 bpm. Exam Location:  Inpatient Procedure: 2D Echo Indications:    Dyspnea R06.00  History:        Patient has prior history of Echocardiogram examinations, most                 recent 01/23/2015. CAD, Signs/Symptoms:Syncope; Risk                 Factors:Hypertension, Dyslipidemia and Former Smoker.                 Bradycardia, 2nd degree AV block, CHB.  Sonographer:    Leavy Cella Referring Phys: [id number] [place]  1. Left ventricular ejection fraction, by estimation, is 55 to 60%. The left ventricle has normal function. Left ventricular endocardial border not optimally defined to evaluate regional wall motion. There is severe concentric left ventricular hypertrophy. Left ventricular diastolic parameters are consistent with Grade II diastolic dysfunction (pseudonormalization). Elevated left ventricular end-diastolic pressure.  2. Right ventricular systolic function is normal. The right ventricular size is normal. Tricuspid regurgitation signal is inadequate for assessing PA pressure.  3. Left atrial size was mildly dilated.  4. Right atrial size was mildly dilated.  5. The mitral valve is normal in structure. No evidence of mitral valve regurgitation. No evidence of mitral stenosis.  6. The aortic valve was not well visualized. Aortic valve regurgitation is not visualized.  7. The inferior vena cava is normal in size with greater than 50% respiratory variability, suggesting right atrial pressure of 3 mmHg. FINDINGS  Left Ventricle: Left ventricular ejection fraction, by estimation, is 55 to 60%. The left ventricle has normal function. Left ventricular endocardial border not optimally defined to evaluate regional wall motion. The left ventricular internal cavity size was normal in size. There is severe concentric left ventricular hypertrophy. Left ventricular diastolic  parameters are consistent with Grade II diastolic dysfunction (pseudonormalization). Elevated left ventricular end-diastolic pressure. Right Ventricle: The right ventricular size is normal. No increase in right ventricular wall thickness. Right ventricular systolic function is normal. Tricuspid regurgitation signal is  PROGRESS NOTE    Nathan Miller  VOJ:[id number] DOB: 1951/02/25 DOA: 03/30/2021 PCP: [facility]    Brief Narrative:  70 y.o. male with medical history significant for CAD, heart block s/p implanted pacemaker, DM, CHF who presents by EMS for evaluation of SOB with chest pain. He reports he laid down for a nap around 1 pm on 03/30/21 and when he woke up he had SOB, chills and chest pain. Claimed to have fevers prior to visit. Pt initially required 3LNC in ED, improved after one dose of IV lasix  Assessment & Plan:   Principal Problem:   Acute respiratory failure with hypoxia ([place]) Active Problems:   Essential hypertension   CAD S/P percutaneous coronary angioplasty; bms X 2 to RCA 2001   Complete heart block (HCC)   Diabetes type 2, controlled (HCC)   Cardiac pacemaker in situ   Obesity with body mass index (BMI) of 30.0 to 39.9   Elevated d-dimer   Elevated brain natriuretic peptide (BNP) level  Acute respiratory failure with hypoxia secondary to acute diastolic CHF exacerbation, chronicity is unknown since this is new diagnosis -CTA reviewed, neg for PE -Pt noted to have BNP of 503 on presentation -2d echo performed and reviewed. Findings of normal LVEF with grade 2 diastolic dysfunction -Clinical improvement with IV lasix thus far -Will continue IV lasix 40mg  bid as tolerated -Follow I/o and daily wts -recheck bmet in AM    Essential hypertension -Continue home medication of Coreg, lisinopril.    Diabetes type 2, controlled  -Pt is on metformin at home. -continue SSI coverage while in hospital -.a1c is 6.4    Elevated d-dimer -CTA reviewed, neg for PE    Complete heart block  -Has pacemaker in place    Cardiac pacemaker in situ  - CAD S/P percutaneous coronary angioplasty; bms X 2 to RCA 2001   Obesity with body mass index (BMI) of 30.0 to 39.9 Chronic  DVT prophylaxis: Lovenox subq Code Status: Full Family Communication: pt in  room, family not at bedside  Status is: Inpatient  Remains inpatient appropriate because:Inpatient level of care appropriate due to severity of illness   Dispo: The patient is from: Home              Anticipated d/c is to: Home              Patient currently is not medically stable to d/c.   Difficult to place patient No   Consultants:     Procedures:     Antimicrobials: Anti-infectives (From admission, onward)   None      Subjective: Reports voiding very well. Feeling better. Asking when he can go home  Objective: Vitals:   03/31/21 2307 04/01/21 0436 04/01/21 0724 04/01/21 1201  BP: (!) 135/113 (!) 157/65 (!) 141/52 (!) 103/45  Pulse: (!) 50 (!) 58 (!) 51 (!) 52  Resp: 18 20  15   Temp:  97.8 F (36.6 C)  97.6 F (36.4 C)  TempSrc:  Oral  Oral  SpO2: 99% 95% 98% 100%  Weight:      Height:        Intake/Output Summary (Last 24 hours) at 04/01/2021 1440 Last data filed at 04/01/2021 1100 Gross per 24 hour  Intake 120 ml  Output 200 ml  Net -80 ml   Filed Weights   03/30/21 2226  Weight: 111.1 kg    Examination: General exam: Conversant, in no acute distress Respiratory system: normal chest rise,  PROGRESS NOTE    Nathan Miller  VOJ:[id number] DOB: 1951/02/25 DOA: 03/30/2021 PCP: [facility]    Brief Narrative:  70 y.o. male with medical history significant for CAD, heart block s/p implanted pacemaker, DM, CHF who presents by EMS for evaluation of SOB with chest pain. He reports he laid down for a nap around 1 pm on 03/30/21 and when he woke up he had SOB, chills and chest pain. Claimed to have fevers prior to visit. Pt initially required 3LNC in ED, improved after one dose of IV lasix  Assessment & Plan:   Principal Problem:   Acute respiratory failure with hypoxia ([place]) Active Problems:   Essential hypertension   CAD S/P percutaneous coronary angioplasty; bms X 2 to RCA 2001   Complete heart block (HCC)   Diabetes type 2, controlled (HCC)   Cardiac pacemaker in situ   Obesity with body mass index (BMI) of 30.0 to 39.9   Elevated d-dimer   Elevated brain natriuretic peptide (BNP) level  Acute respiratory failure with hypoxia secondary to acute diastolic CHF exacerbation, chronicity is unknown since this is new diagnosis -CTA reviewed, neg for PE -Pt noted to have BNP of 503 on presentation -2d echo performed and reviewed. Findings of normal LVEF with grade 2 diastolic dysfunction -Clinical improvement with IV lasix thus far -Will continue IV lasix 40mg  bid as tolerated -Follow I/o and daily wts -recheck bmet in AM    Essential hypertension -Continue home medication of Coreg, lisinopril.    Diabetes type 2, controlled  -Pt is on metformin at home. -continue SSI coverage while in hospital -.a1c is 6.4    Elevated d-dimer -CTA reviewed, neg for PE    Complete heart block  -Has pacemaker in place    Cardiac pacemaker in situ  - CAD S/P percutaneous coronary angioplasty; bms X 2 to RCA 2001   Obesity with body mass index (BMI) of 30.0 to 39.9 Chronic  DVT prophylaxis: Lovenox subq Code Status: Full Family Communication: pt in  room, family not at bedside  Status is: Inpatient  Remains inpatient appropriate because:Inpatient level of care appropriate due to severity of illness   Dispo: The patient is from: Home              Anticipated d/c is to: Home              Patient currently is not medically stable to d/c.   Difficult to place patient No   Consultants:     Procedures:     Antimicrobials: Anti-infectives (From admission, onward)   None      Subjective: Reports voiding very well. Feeling better. Asking when he can go home  Objective: Vitals:   03/31/21 2307 04/01/21 0436 04/01/21 0724 04/01/21 1201  BP: (!) 135/113 (!) 157/65 (!) 141/52 (!) 103/45  Pulse: (!) 50 (!) 58 (!) 51 (!) 52  Resp: 18 20  15   Temp:  97.8 F (36.6 C)  97.6 F (36.4 C)  TempSrc:  Oral  Oral  SpO2: 99% 95% 98% 100%  Weight:      Height:        Intake/Output Summary (Last 24 hours) at 04/01/2021 1440 Last data filed at 04/01/2021 1100 Gross per 24 hour  Intake 120 ml  Output 200 ml  Net -80 ml   Filed Weights   03/30/21 2226  Weight: 111.1 kg    Examination: General exam: Conversant, in no acute distress Respiratory system: normal chest rise,  Chronic lung disease with Emphysema (ICD10-J43.9) and scattered linear lung scarring or atelectasis. 3. Calcified coronary artery and Aortic Atherosclerosis (ICD10-I70.0). Electronically Signed   By: Genevie Ann M.D.   On: 03/31/2021 04:07   DG Chest Port 1 View  Result Date: 03/30/2021 CLINICAL DATA:  Shortness of breath, chills EXAM: PORTABLE CHEST 1 VIEW COMPARISON:  10/30/2020 FINDINGS: Left pacer remains in place, unchanged. Heart is borderline in size. Mild peribronchial thickening. Increased markings at the lung bases, likely atelectasis or scarring, similar to prior study. No effusions. No acute bony abnormality. IMPRESSION: Mild bronchitic changes. Bibasilar atelectasis or scarring. Electronically Signed   By: Rolm Baptise M.D.   On: 03/30/2021 22:54   ECHOCARDIOGRAM COMPLETE  Result Date: 03/31/2021    ECHOCARDIOGRAM REPORT   Patient Name:   Nathan Miller Date of Exam: 03/31/2021 Medical Rec #:  [id number]    Height:       69.0 in Accession #:    [id number]   Weight:       245.0 lb Date of Birth:  1951/02/06    BSA:          2.252 m Patient Age:    17 years     BP:           123/61 mmHg Patient Gender: M            HR:           69 bpm. Exam Location:  Inpatient Procedure: 2D Echo Indications:    Dyspnea R06.00  History:        Patient has prior history of Echocardiogram examinations, most                 recent 01/23/2015. CAD, Signs/Symptoms:Syncope; Risk                 Factors:Hypertension, Dyslipidemia and Former Smoker.                 Bradycardia, 2nd degree AV block, CHB.  Sonographer:    Leavy Cella Referring Phys: [id number] [place]  1. Left ventricular ejection fraction, by estimation, is 55 to 60%. The left ventricle has normal function. Left ventricular endocardial border not optimally defined to evaluate regional wall motion. There is severe concentric left ventricular hypertrophy. Left ventricular diastolic parameters are consistent with Grade II diastolic dysfunction (pseudonormalization). Elevated left ventricular end-diastolic pressure.  2. Right ventricular systolic function is normal. The right ventricular size is normal. Tricuspid regurgitation signal is inadequate for assessing PA pressure.  3. Left atrial size was mildly dilated.  4. Right atrial size was mildly dilated.  5. The mitral valve is normal in structure. No evidence of mitral valve regurgitation. No evidence of mitral stenosis.  6. The aortic valve was not well visualized. Aortic valve regurgitation is not visualized.  7. The inferior vena cava is normal in size with greater than 50% respiratory variability, suggesting right atrial pressure of 3 mmHg. FINDINGS  Left Ventricle: Left ventricular ejection fraction, by estimation, is 55 to 60%. The left ventricle has normal function. Left ventricular endocardial border not optimally defined to evaluate regional wall motion. The left ventricular internal cavity size was normal in size. There is severe concentric left ventricular hypertrophy. Left ventricular diastolic  parameters are consistent with Grade II diastolic dysfunction (pseudonormalization). Elevated left ventricular end-diastolic pressure. Right Ventricle: The right ventricular size is normal. No increase in right ventricular wall thickness. Right ventricular systolic function is normal. Tricuspid regurgitation signal is  PROGRESS NOTE    Nathan Miller  VOJ:[id number] DOB: 1951/02/25 DOA: 03/30/2021 PCP: [facility]    Brief Narrative:  70 y.o. male with medical history significant for CAD, heart block s/p implanted pacemaker, DM, CHF who presents by EMS for evaluation of SOB with chest pain. He reports he laid down for a nap around 1 pm on 03/30/21 and when he woke up he had SOB, chills and chest pain. Claimed to have fevers prior to visit. Pt initially required 3LNC in ED, improved after one dose of IV lasix  Assessment & Plan:   Principal Problem:   Acute respiratory failure with hypoxia ([place]) Active Problems:   Essential hypertension   CAD S/P percutaneous coronary angioplasty; bms X 2 to RCA 2001   Complete heart block (HCC)   Diabetes type 2, controlled (HCC)   Cardiac pacemaker in situ   Obesity with body mass index (BMI) of 30.0 to 39.9   Elevated d-dimer   Elevated brain natriuretic peptide (BNP) level  Acute respiratory failure with hypoxia secondary to acute diastolic CHF exacerbation, chronicity is unknown since this is new diagnosis -CTA reviewed, neg for PE -Pt noted to have BNP of 503 on presentation -2d echo performed and reviewed. Findings of normal LVEF with grade 2 diastolic dysfunction -Clinical improvement with IV lasix thus far -Will continue IV lasix 40mg  bid as tolerated -Follow I/o and daily wts -recheck bmet in AM    Essential hypertension -Continue home medication of Coreg, lisinopril.    Diabetes type 2, controlled  -Pt is on metformin at home. -continue SSI coverage while in hospital -.a1c is 6.4    Elevated d-dimer -CTA reviewed, neg for PE    Complete heart block  -Has pacemaker in place    Cardiac pacemaker in situ  - CAD S/P percutaneous coronary angioplasty; bms X 2 to RCA 2001   Obesity with body mass index (BMI) of 30.0 to 39.9 Chronic  DVT prophylaxis: Lovenox subq Code Status: Full Family Communication: pt in  room, family not at bedside  Status is: Inpatient  Remains inpatient appropriate because:Inpatient level of care appropriate due to severity of illness   Dispo: The patient is from: Home              Anticipated d/c is to: Home              Patient currently is not medically stable to d/c.   Difficult to place patient No   Consultants:     Procedures:     Antimicrobials: Anti-infectives (From admission, onward)   None      Subjective: Reports voiding very well. Feeling better. Asking when he can go home  Objective: Vitals:   03/31/21 2307 04/01/21 0436 04/01/21 0724 04/01/21 1201  BP: (!) 135/113 (!) 157/65 (!) 141/52 (!) 103/45  Pulse: (!) 50 (!) 58 (!) 51 (!) 52  Resp: 18 20  15   Temp:  97.8 F (36.6 C)  97.6 F (36.4 C)  TempSrc:  Oral  Oral  SpO2: 99% 95% 98% 100%  Weight:      Height:        Intake/Output Summary (Last 24 hours) at 04/01/2021 1440 Last data filed at 04/01/2021 1100 Gross per 24 hour  Intake 120 ml  Output 200 ml  Net -80 ml   Filed Weights   03/30/21 2226  Weight: 111.1 kg    Examination: General exam: Conversant, in no acute distress Respiratory system: normal chest rise,

## 2021-04-02 DIAGNOSIS — I442 Atrioventricular block, complete: Secondary | ICD-10-CM

## 2021-04-02 LAB — COMPREHENSIVE METABOLIC PANEL
ALT: 16 U/L (ref 0–44)
AST: 24 U/L (ref 15–41)
Albumin: 3.2 g/dL — ABNORMAL LOW (ref 3.5–5.0)
Alkaline Phosphatase: 96 U/L (ref 38–126)
Anion gap: 8 (ref 5–15)
BUN: 17 mg/dL (ref 8–23)
CO2: 37 mmol/L — ABNORMAL HIGH (ref 22–32)
Calcium: 8.9 mg/dL (ref 8.9–10.3)
Chloride: 91 mmol/L — ABNORMAL LOW (ref 98–111)
Creatinine, Ser: 0.89 mg/dL (ref 0.61–1.24)
GFR, Estimated: >60 mL/min (ref >60)
Glucose, Bld: 96 mg/dL (ref 70–99)
Potassium: 3.6 mmol/L (ref 3.5–5.1)
Sodium: 136 mmol/L (ref 135–145)
Total Bilirubin: 1.5 mg/dL — ABNORMAL HIGH (ref 0.3–1.2)
Total Protein: 6.6 g/dL (ref 6.5–8.1)

## 2021-04-02 LAB — GLUCOSE, CAPILLARY
Glucose-Capillary: 131 mg/dL — ABNORMAL HIGH (ref 70–99)
Glucose-Capillary: 119 mg/dL — ABNORMAL HIGH (ref 70–99)
Glucose-Capillary: 269 mg/dL — ABNORMAL HIGH (ref 70–99)
Glucose-Capillary: 99 mg/dL (ref 70–99)

## 2021-04-02 LAB — CBC
HCT: 42.9 % (ref 39.0–52.0)
Hemoglobin: 14 g/dL (ref 13.0–17.0)
MCH: 29.3 pg (ref 26.0–34.0)
MCHC: 32.6 g/dL (ref 30.0–36.0)
MCV: 89.7 fL (ref 80.0–100.0)
Platelets: 204 10*3/uL (ref 150–400)
RBC: 4.78 MIL/uL (ref 4.22–5.81)
RDW: 14.2 % (ref 11.5–15.5)
WBC: 7.4 10*3/uL (ref 4.0–10.5)
nRBC: 0 % (ref 0.0–0.2)

## 2021-04-02 NOTE — Progress Notes (Signed)
PROGRESS NOTE    Nathan Miller  ZOX:[id number] DOB: 25-Sep-1951 DOA: 03/30/2021 PCP: [facility]    Brief Narrative:  70 y.o. male with medical history significant for CAD, heart block s/p implanted pacemaker, DM, CHF who presents by EMS for evaluation of SOB with chest pain. He reports he laid down for a nap around 1 pm on 03/30/21 and when he woke up he had SOB, chills and chest pain. Claimed to have fevers prior to visit. Pt initially required 3LNC in ED, improved after one dose of IV lasix  Assessment & Plan:   Principal Problem:   Acute respiratory failure with hypoxia (HCC) Active Problems:   Essential hypertension   CAD S/P percutaneous coronary angioplasty; bms X 2 to RCA 2001   Complete heart block (HCC)   Diabetes type 2, controlled (HCC)   Cardiac pacemaker in situ   Obesity with body mass index (BMI) of 30.0 to 39.9   Elevated d-dimer   Elevated brain natriuretic peptide (BNP) level  Acute respiratory failure with hypoxia secondary to acute diastolic CHF exacerbation, chronicity is unknown since this is new diagnosis -CTA reviewed, neg for PE -Pt noted to have BNP of 503 on presentation -2d echo performed and reviewed. Findings of normal LVEF with grade 2 diastolic dysfunction -Clinical improvement with IV lasix thus far -Will continue IV lasix 40mg  bid as tolerated -Pt believes drinking more fluids would result in pt voiding more frequently. Educated pt to avoid excess PO fluid intake, will keep 1800cc fluid restricted diet -Follow I/o and daily wts -recheck bmet in AM    Essential hypertension -Continue home medication of Coreg, lisinopril.    Diabetes type 2, controlled  -Pt had been on metformin at home. -continue SSI coverage while in hospital -.a1c is 6.4    Elevated d-dimer -CTA reviewed, neg for PE    Complete heart block  -Has pacemaker in place -Stable at present, on tele    Cardiac pacemaker in situ  - CAD S/P  percutaneous coronary angioplasty; bms X 2 to RCA 2001   Obesity with body mass index (BMI) of 30.0 to 39.9 Chronic  DVT prophylaxis: Lovenox subq Code Status: Full Family Communication: pt in room, family not at bedside  Status is: Inpatient  Remains inpatient appropriate because:Inpatient level of care appropriate due to severity of illness   Dispo: The patient is from: Home              Anticipated d/c is to: Home              Patient currently is not medically stable to d/c.   Difficult to place patient No   Consultants:     Procedures:     Antimicrobials: Anti-infectives (From admission, onward)   None      Subjective: Without complaints this AM  Objective: Vitals:   04/02/21 0729 04/02/21 0748 04/02/21 1202 04/02/21 1512  BP: (!) 106/53 134/65 (!) 143/46 (!) 150/38  Pulse: (!) 55 (!) 50 (!) 50 (!) 50  Resp: 15 16 19 20   Temp:  97.6 F (36.4 C) 98.2 F (36.8 C) 98.1 F (36.7 C)  TempSrc:  Oral Oral Oral  SpO2: 97% 92% 95% 100%  Weight:      Height:        Intake/Output Summary (Last 24 hours) at 04/02/2021 1612 Last data filed at 04/02/2021 1207 Gross per 24 hour  Intake 368 ml  Output --  Net 368 ml   Ceasar Mons  Weights   03/30/21 2226 04/01/21 1525  Weight: 111.1 kg 112.9 kg    Examination: General exam: Conversant, in no acute distress Respiratory system: normal chest rise, clear, no audible wheezing Cardiovascular system: regular rhythm, s1-s2 Gastrointestinal system: Nondistended, nontender, pos BS Central nervous system: No seizures, no tremors Extremities: No cyanosis, no joint deformities, BLE edema Skin: No rashes, no pallor Psychiatry: Affect normal // no auditory hallucinations   Data Reviewed: I have personally reviewed following labs and imaging studies  CBC: Recent Labs  Lab 03/30/21 2239 03/31/21 0754 04/01/21 0221 04/02/21 0224  WBC 15.2* 11.6* 6.2 7.4  NEUTROABS 13.2*  --   --   --   HGB 14.6 13.9 14.5 14.0  HCT 45.0  43.7 44.1 42.9  MCV 90.9 91.2 89.3 89.7  PLT 237 237 165 204   Basic Metabolic Panel: Recent Labs  Lab 03/30/21 2239 03/31/21 0754 04/01/21 0221 04/02/21 0224  NA 131* 130* 132* 136  K 3.7 3.8 3.4* 3.6  CL 90* 90* 91* 91*  CO2 28 31 31  37*  GLUCOSE 143* 128* 109* 96  BUN 8 10 10 17   CREATININE 0.75 0.79 0.80 0.89  CALCIUM 8.8* 8.8* 8.8* 8.9  MG  --   --  1.7  --    GFR: Estimated Creatinine Clearance: 99.8 mL/min (by C-G formula based on SCr of 0.89 mg/dL). Liver Function Tests: Recent Labs  Lab 04/01/21 0221 04/02/21 0224  AST 22 24  ALT 16 16  ALKPHOS 104 96  BILITOT 2.6* 1.5*  PROT 6.4* 6.6  ALBUMIN 3.2* 3.2*   No results for input(s): LIPASE, AMYLASE in the last 168 hours. No results for input(s): AMMONIA in the last 168 hours. Coagulation Profile: No results for input(s): INR, PROTIME in the last 168 hours. Cardiac Enzymes: No results for input(s): CKTOTAL, CKMB, CKMBINDEX, TROPONINI in the last 168 hours. BNP (last 3 results) No results for input(s): PROBNP in the last 8760 hours. HbA1C: Recent Labs    03/31/21 0754  HGBA1C 6.4*   CBG: Recent Labs  Lab 04/01/21 1605 04/01/21 1952 04/02/21 0747 04/02/21 1200 04/02/21 1531  GLUCAP 117* 237* 131* 119* 269*   Lipid Profile: No results for input(s): CHOL, HDL, LDLCALC, TRIG, CHOLHDL, LDLDIRECT in the last 72 hours. Thyroid Function Tests: No results for input(s): TSH, T4TOTAL, FREET4, T3FREE, THYROIDAB in the last 72 hours. Anemia Panel: No results for input(s): VITAMINB12, FOLATE, FERRITIN, TIBC, IRON, RETICCTPCT in the last 72 hours. Sepsis Labs: No results for input(s): PROCALCITON, LATICACIDVEN in the last 168 hours.  Recent Results (from the past 240 hour(s))  Resp Panel by RT-PCR (Flu A&B, Covid) Nasopharyngeal Swab     Status: None   Collection Time: 03/30/21 10:39 PM   Specimen: Nasopharyngeal Swab; Nasopharyngeal(NP) swabs in vial transport medium  Result Value Ref Range Status   SARS  Coronavirus 2 by RT PCR NEGATIVE NEGATIVE Final    Comment: (NOTE) SARS-CoV-2 target nucleic acids are NOT DETECTED.  The SARS-CoV-2 RNA is generally detectable in upper respiratory specimens during the acute phase of infection. The lowest concentration of SARS-CoV-2 viral copies this assay can detect is 138 copies/mL. A negative result does not preclude SARS-Cov-2 infection and should not be used as the sole basis for treatment or other patient management decisions. A negative result may occur with  improper specimen collection/handling, submission of specimen other than nasopharyngeal swab, presence of viral mutation(s) within the areas targeted by this assay, and inadequate number of viral copies(<138 copies/mL). A negative  result must be combined with clinical observations, patient history, and epidemiological information. The expected result is Negative.  Fact Sheet for Patients:  BloggerCourse.com  Fact Sheet for Healthcare Providers:  SeriousBroker.it  This test is no t yet approved or cleared by the [place] FDA and  has been authorized for detection and/or diagnosis of SARS-CoV-2 by FDA under an Emergency Use Authorization (EUA). This EUA will remain  in effect (meaning this test can be used) for the duration of the COVID-19 declaration under Section 564(b)(1) of the Act, 21 U.S.C.section 360bbb-3(b)(1), unless the authorization is terminated  or revoked sooner.       Influenza A by PCR NEGATIVE NEGATIVE Final   Influenza B by PCR NEGATIVE NEGATIVE Final    Comment: (NOTE) The Xpert Xpress SARS-CoV-2/FLU/RSV plus assay is intended as an aid in the diagnosis of influenza from Nasopharyngeal swab specimens and should not be used as a sole basis for treatment. Nasal washings and aspirates are unacceptable for Xpert Xpress SARS-CoV-2/FLU/RSV testing.  Fact Sheet for  Patients: BloggerCourse.com  Fact Sheet for Healthcare Providers: SeriousBroker.it  This test is not yet approved or cleared by the [place] FDA and has been authorized for detection and/or diagnosis of SARS-CoV-2 by FDA under an Emergency Use Authorization (EUA). This EUA will remain in effect (meaning this test can be used) for the duration of the COVID-19 declaration under Section 564(b)(1) of the Act, 21 U.S.C. section 360bbb-3(b)(1), unless the authorization is terminated or revoked.  Performed at [facility], [address], [place], [place] [address]   MRSA PCR Screening     Status: None   Collection Time: 03/31/21  3:00 PM   Specimen: Nasal Mucosa; Nasopharyngeal  Result Value Ref Range Status   MRSA by PCR NEGATIVE NEGATIVE Final    Comment:        The GeneXpert MRSA Assay (FDA approved for NASAL specimens only), is one component of a comprehensive MRSA colonization surveillance program. It is not intended to diagnose MRSA infection nor to guide or monitor treatment for MRSA infections. Performed at [facility], [address], [place], [place] [address]      Radiology Studies: No results found.  Scheduled Meds: . aspirin EC  81 mg Oral Daily  . atorvastatin  80 mg Oral Daily  . carvedilol  25 mg Oral BID  . enoxaparin (LOVENOX) injection  0.5 mg/kg Subcutaneous Q24H  . furosemide  40 mg Intravenous BID  . insulin aspart  0-5 Units Subcutaneous QHS  . insulin aspart  0-9 Units Subcutaneous TID WC  . lisinopril  40 mg Oral Daily  . multivitamin with minerals  1 tablet Oral Daily  . sodium chloride flush  3 mL Intravenous Q12H   Continuous Infusions: . sodium chloride       LOS: 2 days   Rickey Barbara, MD [facility] Pager On Amion  If 7PM-7AM, please contact night-coverage 04/02/2021, 4:12 PM

## 2021-04-03 ENCOUNTER — Inpatient Hospital Stay (HOSPITAL_COMMUNITY): Payer: PPO

## 2021-04-03 LAB — COMPREHENSIVE METABOLIC PANEL
ALT: 17 U/L (ref 0–44)
AST: 18 U/L (ref 15–41)
Albumin: 3.1 g/dL — ABNORMAL LOW (ref 3.5–5.0)
Alkaline Phosphatase: 90 U/L (ref 38–126)
Anion gap: 9 (ref 5–15)
BUN: 16 mg/dL (ref 8–23)
CO2: 38 mmol/L — ABNORMAL HIGH (ref 22–32)
Calcium: 8.8 mg/dL — ABNORMAL LOW (ref 8.9–10.3)
Chloride: 87 mmol/L — ABNORMAL LOW (ref 98–111)
Creatinine, Ser: 0.9 mg/dL (ref 0.61–1.24)
GFR, Estimated: >60 mL/min (ref >60)
Glucose, Bld: 109 mg/dL — ABNORMAL HIGH (ref 70–99)
Potassium: 3.1 mmol/L — ABNORMAL LOW (ref 3.5–5.1)
Sodium: 134 mmol/L — ABNORMAL LOW (ref 135–145)
Total Bilirubin: 1.5 mg/dL — ABNORMAL HIGH (ref 0.3–1.2)
Total Protein: 6.2 g/dL — ABNORMAL LOW (ref 6.5–8.1)

## 2021-04-03 LAB — GLUCOSE, CAPILLARY
Glucose-Capillary: 111 mg/dL — ABNORMAL HIGH (ref 70–99)
Glucose-Capillary: 169 mg/dL — ABNORMAL HIGH (ref 70–99)
Glucose-Capillary: 167 mg/dL — ABNORMAL HIGH (ref 70–99)
Glucose-Capillary: 123 mg/dL — ABNORMAL HIGH (ref 70–99)

## 2021-04-03 LAB — MAGNESIUM: Magnesium: 1.9 mg/dL (ref 1.7–2.4)

## 2021-04-03 MED ORDER — FUROSEMIDE 10 MG/ML IJ SOLN
60.0000 mg | Freq: Two times a day (BID) | INTRAMUSCULAR | Status: DC
  Filled 2021-04-03: qty 6

## 2021-04-03 MED ORDER — CARVEDILOL 6.25 MG PO TABS: 6.2500 mg | ORAL_TABLET | Freq: Two times a day (BID) | ORAL | Status: DC

## 2021-04-03 MED ORDER — IBUPROFEN 600 MG PO TABS
600.0000 mg | ORAL_TABLET | Freq: Once | ORAL | Status: DC
  Filled 2021-04-03: qty 1

## 2021-04-03 MED ORDER — LISINOPRIL 20 MG PO TABS
20.0000 mg | ORAL_TABLET | Freq: Every day | ORAL | Status: DC
  Administered 2021-04-05: 20 mg via ORAL
  Filled 2021-04-03 (×2): qty 1

## 2021-04-03 MED ORDER — POTASSIUM CHLORIDE CRYS ER 20 MEQ PO TBCR
40.0000 meq | EXTENDED_RELEASE_TABLET | Freq: Two times a day (BID) | ORAL | Status: AC
  Administered 2021-04-03 (×2): 40 meq via ORAL
  Filled 2021-04-03 (×2): qty 2

## 2021-04-03 MED ORDER — CARVEDILOL 3.125 MG PO TABS
3.1250 mg | ORAL_TABLET | Freq: Two times a day (BID) | ORAL | Status: DC
  Administered 2021-04-03: 3.125 mg via ORAL
  Filled 2021-04-03: qty 1

## 2021-04-03 NOTE — Plan of Care (Signed)

## 2021-04-03 NOTE — Progress Notes (Signed)
Patient noted with a temp of 100.7 earlier this shift. Tylenol 650 mg was administered with little effect. Current temp 100.3. On call provider paged with patients condition.

## 2021-04-03 NOTE — Progress Notes (Signed)
Chaplain responded to call from nurse. Patient requests prayer.  Patient has been hospitalized since Thursday. No complaints whatsover about his illness or way he's been treated.  He was "just wanting to get out of here.  I miss my church.  My wife. My home."  He is a member of [employer] and was missing worship today. Chaplain offered support and prayer.  His wife is coming shortly to see him. Rev. Tamsen Snider Pager [phone]

## 2021-04-03 NOTE — Plan of Care (Signed)

## 2021-04-03 NOTE — Progress Notes (Signed)
PROGRESS NOTE    Nathan Miller  QMV:[id number] DOB: [birth year] DOA: 03/30/2021 PCP: [facility]    Brief Narrative:  70 y.o. male with medical history significant for CAD, heart block s/p implanted pacemaker, DM, CHF who presents by EMS for evaluation of SOB with chest pain. He reports he laid down for a nap around 1 pm on 03/30/21 and when he woke up he had SOB, chills and chest pain. Claimed to have fevers prior to visit. Pt initially required 3LNC in ED, improved after one dose of IV lasix  Assessment & Plan:   Principal Problem:   Acute respiratory failure with hypoxia (HCC) Active Problems:   Essential hypertension   CAD S/P percutaneous coronary angioplasty; bms X 2 to RCA 2001   Complete heart block (HCC)   Diabetes type 2, controlled (HCC)   Cardiac pacemaker in situ   Obesity with body mass index (BMI) of 30.0 to 39.9   Elevated d-dimer   Elevated brain natriuretic peptide (BNP) level  Acute respiratory failure with hypoxia secondary to acute diastolic CHF exacerbation, chronicity is unknown since this is new diagnosis -CTA reviewed, neg for PE -Pt noted to have BNP of 503 on presentation -2d echo performed and reviewed. Findings of normal LVEF with grade 2 diastolic dysfunction -Clinical improvement with IV lasix thus far -Will continue IV lasix 40mg  bid as tolerated -Pt believes drinking more fluids would result in pt voiding more frequently. Educated pt to avoid excess PO fluid intake, cont 1800cc fluid restricted diet -Follow I/o and daily wts -repeat bmet in am    Essential hypertension -Continue home medication of Coreg, lisinopril.    Diabetes type 2, controlled  -Pt had been on metformin at home. -continue SSI coverage while in hospital -.a1c is 6.4    Elevated d-dimer -CTA reviewed, neg for PE    Complete heart block  -Has pacemaker in place -Stable at this time    Cardiac pacemaker in situ  - CAD S/P percutaneous  coronary angioplasty; bms X 2 to RCA 2001   Obesity with body mass index (BMI) of 30.0 to 39.9 Chronic  DVT prophylaxis: Lovenox subq Code Status: Full Family Communication: pt in room, family not at bedside  Status is: Inpatient  Remains inpatient appropriate because:Inpatient level of care appropriate due to severity of illness   Dispo: The patient is from: Home              Anticipated d/c is to: Home              Patient currently is not medically stable to d/c.   Difficult to place patient No   Consultants:     Procedures:     Antimicrobials: Anti-infectives (From admission, onward)   None      Subjective: Eager to go home  Objective: Vitals:   04/03/21 1200 04/03/21 1225 04/03/21 1300 04/03/21 1400  BP:  (!) 98/43    Pulse: (!) 50 (!) 50 (!) 50 (!) 50  Resp: (!) 28 20 18  (!) 30  Temp:  98.3 F (36.8 C)    TempSrc:  Oral    SpO2: 95% 96% 95% 92%  Weight:      Height:        Intake/Output Summary (Last 24 hours) at 04/03/2021 1510 Last data filed at 04/03/2021 0800 Gross per 24 hour  Intake 120 ml  Output 350 ml  Net -230 ml   Filed Weights   03/30/21 2226 04/01/21 1525  04/03/21 0443  Weight: 111.1 kg 112.9 kg 114 kg    Examination: General exam: Conversant, in no acute distress Respiratory system: normal chest rise, clear, no audible wheezing Cardiovascular system: regular rhythm, s1-s2 Gastrointestinal system: Nondistended, nontender, pos BS Central nervous system: No seizures, no tremors Extremities: No cyanosis, no joint deformities Skin: No rashes, no pallor Psychiatry: Affect normal // no auditory hallucinations   Data Reviewed: I have personally reviewed following labs and imaging studies  CBC: Recent Labs  Lab 03/30/21 2239 03/31/21 0754 04/01/21 0221 04/02/21 0224  WBC 15.2* 11.6* 6.2 7.4  NEUTROABS 13.2*  --   --   --   HGB 14.6 13.9 14.5 14.0  HCT 45.0 43.7 44.1 42.9  MCV 90.9 91.2 89.3 89.7  PLT 237 237 165 204    Basic Metabolic Panel: Recent Labs  Lab 03/30/21 2239 03/31/21 0754 04/01/21 0221 04/02/21 0224 04/03/21 0226  NA 131* 130* 132* 136 134*  K 3.7 3.8 3.4* 3.6 3.1*  CL 90* 90* 91* 91* 87*  CO2 28 31 31  37* 38*  GLUCOSE 143* 128* 109* 96 109*  BUN 8 10 10 17 16   CREATININE 0.75 0.79 0.80 0.89 0.90  CALCIUM 8.8* 8.8* 8.8* 8.9 8.8*  MG  --   --  1.7  --  1.9   GFR: Estimated Creatinine Clearance: 99.1 mL/min (by C-G formula based on SCr of 0.9 mg/dL). Liver Function Tests: Recent Labs  Lab 04/01/21 0221 04/02/21 0224 04/03/21 0226  AST 22 24 18   ALT 16 16 17   ALKPHOS 104 96 90  BILITOT 2.6* 1.5* 1.5*  PROT 6.4* 6.6 6.2*  ALBUMIN 3.2* 3.2* 3.1*   No results for input(s): LIPASE, AMYLASE in the last 168 hours. No results for input(s): AMMONIA in the last 168 hours. Coagulation Profile: No results for input(s): INR, PROTIME in the last 168 hours. Cardiac Enzymes: No results for input(s): CKTOTAL, CKMB, CKMBINDEX, TROPONINI in the last 168 hours. BNP (last 3 results) No results for input(s): PROBNP in the last 8760 hours. HbA1C: No results for input(s): HGBA1C in the last 72 hours. CBG: Recent Labs  Lab 04/02/21 1200 04/02/21 1531 04/02/21 2053 04/03/21 0759 04/03/21 1222  GLUCAP 119* 269* 99 111* 169*   Lipid Profile: No results for input(s): CHOL, HDL, LDLCALC, TRIG, CHOLHDL, LDLDIRECT in the last 72 hours. Thyroid Function Tests: No results for input(s): TSH, T4TOTAL, FREET4, T3FREE, THYROIDAB in the last 72 hours. Anemia Panel: No results for input(s): VITAMINB12, FOLATE, FERRITIN, TIBC, IRON, RETICCTPCT in the last 72 hours. Sepsis Labs: No results for input(s): PROCALCITON, LATICACIDVEN in the last 168 hours.  Recent Results (from the past 240 hour(s))  Resp Panel by RT-PCR (Flu A&B, Covid) Nasopharyngeal Swab     Status: None   Collection Time: 03/30/21 10:39 PM   Specimen: Nasopharyngeal Swab; Nasopharyngeal(NP) swabs in vial transport medium   Result Value Ref Range Status   SARS Coronavirus 2 by RT PCR NEGATIVE NEGATIVE Final    Comment: (NOTE) SARS-CoV-2 target nucleic acids are NOT DETECTED.  The SARS-CoV-2 RNA is generally detectable in upper respiratory specimens during the acute phase of infection. The lowest concentration of SARS-CoV-2 viral copies this assay can detect is 138 copies/mL. A negative result does not preclude SARS-Cov-2 infection and should not be used as the sole basis for treatment or other patient management decisions. A negative result may occur with  improper specimen collection/handling, submission of specimen other than nasopharyngeal swab, presence of viral mutation(s) within the areas targeted  by this assay, and inadequate number of viral copies(<138 copies/mL). A negative result must be combined with clinical observations, patient history, and epidemiological information. The expected result is Negative.  Fact Sheet for Patients:  BloggerCourse.com  Fact Sheet for Healthcare Providers:  SeriousBroker.it  This test is no t yet approved or cleared by the [place] FDA and  has been authorized for detection and/or diagnosis of SARS-CoV-2 by FDA under an Emergency Use Authorization (EUA). This EUA will remain  in effect (meaning this test can be used) for the duration of the COVID-19 declaration under Section 564(b)(1) of the Act, 21 U.S.C.section 360bbb-3(b)(1), unless the authorization is terminated  or revoked sooner.       Influenza A by PCR NEGATIVE NEGATIVE Final   Influenza B by PCR NEGATIVE NEGATIVE Final    Comment: (NOTE) The Xpert Xpress SARS-CoV-2/FLU/RSV plus assay is intended as an aid in the diagnosis of influenza from Nasopharyngeal swab specimens and should not be used as a sole basis for treatment. Nasal washings and aspirates are unacceptable for Xpert Xpress SARS-CoV-2/FLU/RSV testing.  Fact Sheet for  Patients: BloggerCourse.com  Fact Sheet for Healthcare Providers: SeriousBroker.it  This test is not yet approved or cleared by the [place] FDA and has been authorized for detection and/or diagnosis of SARS-CoV-2 by FDA under an Emergency Use Authorization (EUA). This EUA will remain in effect (meaning this test can be used) for the duration of the COVID-19 declaration under Section 564(b)(1) of the Act, 21 U.S.C. section 360bbb-3(b)(1), unless the authorization is terminated or revoked.  Performed at [facility], [address] [place], [place] [address]   MRSA PCR Screening     Status: None   Collection Time: 03/31/21  3:00 PM   Specimen: Nasal Mucosa; Nasopharyngeal  Result Value Ref Range Status   MRSA by PCR NEGATIVE NEGATIVE Final    Comment:        The GeneXpert MRSA Assay (FDA approved for NASAL specimens only), is one component of a comprehensive MRSA colonization surveillance program. It is not intended to diagnose MRSA infection nor to guide or monitor treatment for MRSA infections. Performed at [facility], [address] [place], [place] [address]      Radiology Studies: No results found.  Scheduled Meds: . aspirin EC  81 mg Oral Daily  . atorvastatin  80 mg Oral Daily  . carvedilol  6.25 mg Oral BID  . enoxaparin (LOVENOX) injection  0.5 mg/kg Subcutaneous Q24H  . furosemide  60 mg Intravenous BID  . insulin aspart  0-5 Units Subcutaneous QHS  . insulin aspart  0-9 Units Subcutaneous TID WC  . [START ON 04/04/2021] lisinopril  20 mg Oral Daily  . multivitamin with minerals  1 tablet Oral Daily  . potassium chloride  40 mEq Oral BID  . sodium chloride flush  3 mL Intravenous Q12H   Continuous Infusions: . sodium chloride       LOS: 3 days   Rickey Barbara, MD [facility] Pager On Amion  If 7PM-7AM, please contact night-coverage 04/03/2021, 3:10 PM

## 2021-04-04 LAB — CBC
HCT: 43.7 % (ref 39.0–52.0)
Hemoglobin: 14.7 g/dL (ref 13.0–17.0)
MCH: 30.2 pg (ref 26.0–34.0)
MCHC: 33.6 g/dL (ref 30.0–36.0)
MCV: 89.7 fL (ref 80.0–100.0)
Platelets: 184 10*3/uL (ref 150–400)
RBC: 4.87 MIL/uL (ref 4.22–5.81)
RDW: 14.1 % (ref 11.5–15.5)
WBC: 11.9 10*3/uL — ABNORMAL HIGH (ref 4.0–10.5)
nRBC: 0 % (ref 0.0–0.2)

## 2021-04-04 LAB — BLOOD CULTURE ID PANEL (REFLEXED) - BCID2

## 2021-04-04 LAB — URINALYSIS, ROUTINE W REFLEX MICROSCOPIC
Bilirubin Urine: NEGATIVE
Glucose, UA: NEGATIVE mg/dL
Hgb urine dipstick: NEGATIVE
Ketones, ur: NEGATIVE mg/dL
Leukocytes,Ua: NEGATIVE
Nitrite: NEGATIVE
Protein, ur: 30 mg/dL — AB
Specific Gravity, Urine: 1.021 (ref 1.005–1.030)
pH: 5 (ref 5.0–8.0)

## 2021-04-04 LAB — COMPREHENSIVE METABOLIC PANEL
ALT: 20 U/L (ref 0–44)
AST: 21 U/L (ref 15–41)
Albumin: 3.4 g/dL — ABNORMAL LOW (ref 3.5–5.0)
Alkaline Phosphatase: 101 U/L (ref 38–126)
Anion gap: 11 (ref 5–15)
BUN: 22 mg/dL (ref 8–23)
CO2: 32 mmol/L (ref 22–32)
Calcium: 9.1 mg/dL (ref 8.9–10.3)
Chloride: 88 mmol/L — ABNORMAL LOW (ref 98–111)
Creatinine, Ser: 1.23 mg/dL (ref 0.61–1.24)
GFR, Estimated: >60 mL/min (ref >60)
Glucose, Bld: 144 mg/dL — ABNORMAL HIGH (ref 70–99)
Potassium: 4 mmol/L (ref 3.5–5.1)
Sodium: 131 mmol/L — ABNORMAL LOW (ref 135–145)
Total Bilirubin: 2.8 mg/dL — ABNORMAL HIGH (ref 0.3–1.2)
Total Protein: 7 g/dL (ref 6.5–8.1)

## 2021-04-04 LAB — GLUCOSE, CAPILLARY
Glucose-Capillary: 231 mg/dL — ABNORMAL HIGH (ref 70–99)
Glucose-Capillary: 119 mg/dL — ABNORMAL HIGH (ref 70–99)
Glucose-Capillary: 93 mg/dL (ref 70–99)
Glucose-Capillary: 113 mg/dL — ABNORMAL HIGH (ref 70–99)

## 2021-04-04 MED ORDER — SODIUM CHLORIDE 0.9 % IV SOLN: 2.0000 g | INTRAVENOUS | Status: DC

## 2021-04-04 MED ORDER — VANCOMYCIN HCL 1500 MG/300ML IV SOLN: 1500.0000 mg | INTRAVENOUS | Status: DC

## 2021-04-04 MED ORDER — POLYETHYLENE GLYCOL 3350 17 G PO PACK
17.0000 g | PACK | Freq: Every day | ORAL | Status: DC | PRN
  Administered 2021-04-05: 17 g via ORAL
  Filled 2021-04-04: qty 1

## 2021-04-04 MED ORDER — VANCOMYCIN HCL 2000 MG/400ML IV SOLN
2000.0000 mg | Freq: Once | INTRAVENOUS | Status: AC
  Administered 2021-04-04: 2000 mg via INTRAVENOUS
  Filled 2021-04-04: qty 400

## 2021-04-04 MED ORDER — HYDRALAZINE HCL 20 MG/ML IJ SOLN: 10.0000 mg | INTRAMUSCULAR | Status: DC | PRN

## 2021-04-04 NOTE — Progress Notes (Addendum)
HOSPITAL MEDICINE OVERNIGHT EVENT NOTE    Notified by pharmacy that 2 out of 4 blood cultures obtained evening of 6/5 are growing out gram-positive cocci, Streptococcus species.  Placing patient on intravenous vancomycin due to documented severe allergy to penicillin.  Vernelle Emerald  MD [facility]

## 2021-04-04 NOTE — Plan of Care (Signed)
  Problem: Education: Goal: Knowledge of General Education information will improve Description: Including pain rating scale, medication(s)/side effects and non-pharmacologic comfort measures 04/04/2021 2231 by Burnice Logan, LPN Outcome: Progressing 04/04/2021 2231 by Burnice Logan, LPN Outcome: Progressing   Problem: Health Behavior/Discharge Planning: Goal: Ability to manage health-related needs will improve Outcome: Progressing   Problem: Clinical Measurements: Goal: Ability to maintain clinical measurements within normal limits will improve Outcome: Progressing   Problem: Activity: Goal: Risk for activity intolerance will decrease 04/04/2021 2231 by Burnice Logan, LPN Outcome: Progressing 04/04/2021 2231 by Burnice Logan, LPN Outcome: Progressing   Problem: Nutrition: Goal: Adequate nutrition will be maintained 04/04/2021 2231 by Burnice Logan, LPN Outcome: Progressing 04/04/2021 2231 by Burnice Logan, LPN Outcome: Progressing   Problem: Coping: Goal: Level of anxiety will decrease 04/04/2021 2231 by Burnice Logan, LPN Outcome: Progressing 04/04/2021 2231 by Burnice Logan, LPN Outcome: Progressing   Problem: Elimination: Goal: Will not experience complications related to bowel motility Outcome: Progressing   Problem: Pain Managment: Goal: General experience of comfort will improve 04/04/2021 2231 by Burnice Logan, LPN Outcome: Progressing 04/04/2021 2231 by Burnice Logan, LPN Outcome: Progressing

## 2021-04-04 NOTE — Plan of Care (Signed)

## 2021-04-04 NOTE — Plan of Care (Signed)

## 2021-04-04 NOTE — Progress Notes (Signed)
BP 100/31. MD made aware, no new orders.

## 2021-04-04 NOTE — Progress Notes (Signed)
PROGRESS NOTE    Nathan Miller  UEA:[id number] DOB: [birth year] DOA: 03/30/2021 PCP: [facility]    Brief Narrative:  70 y.o. male with medical history significant for CAD, heart block s/p implanted pacemaker, DM, CHF who presents by EMS for evaluation of SOB with chest pain. He reports he laid down for a nap around 1 pm on 03/30/21 and when he woke up he had SOB, chills and chest pain. Claimed to have fevers prior to visit. Pt initially required 3LNC in ED, improved after one dose of IV lasix  Assessment & Plan:   Principal Problem:   Acute respiratory failure with hypoxia (HCC) Active Problems:   Essential hypertension   CAD S/P percutaneous coronary angioplasty; bms X 2 to RCA 2001   Complete heart block (HCC)   Diabetes type 2, controlled (HCC)   Cardiac pacemaker in situ   Obesity with body mass index (BMI) of 30.0 to 39.9   Elevated d-dimer   Elevated brain natriuretic peptide (BNP) level  Acute respiratory failure with hypoxia secondary to acute diastolic CHF exacerbation, chronicity is unknown since this is new diagnosis -CTA reviewed, neg for PE -Pt noted to have BNP of 503 on presentation -2d echo performed and reviewed. Findings of normal LVEF with grade 2 diastolic dysfunction -Clinical improvement with IV lasix, Cr up to 1.22, thus will hold lasix -recheck bmet in AM    Essential hypertension -BP soft this afternoon, coreg and lisinopril was held this AM    Diabetes type 2, controlled  -Pt had been on metformin at home. -continue SSI coverage while in hospital -.a1c is 6.4    Elevated d-dimer -CTA reviewed, neg for PE    Complete heart block  -Has pacemaker in place -Stable at this time    Cardiac pacemaker in situ  - CAD S/P percutaneous coronary angioplasty; bms X 2 to RCA 2001   Obesity with body mass index (BMI) of 30.0 to 39.9 Chronic  ARF -likely secondary to lasix, now on hold -Repeat bmet in AM  DVT  prophylaxis: Lovenox subq Code Status: Full Family Communication: pt in room, family not at bedside  Status is: Inpatient  Remains inpatient appropriate because:Inpatient level of care appropriate due to severity of illness   Dispo: The patient is from: Home              Anticipated d/c is to: Home              Patient currently is not medically stable to d/c.   Difficult to place patient No   Consultants:     Procedures:     Antimicrobials: Anti-infectives (From admission, onward)   None      Subjective: Asking about going home soon  Objective: Vitals:   04/04/21 0850 04/04/21 1131 04/04/21 1221 04/04/21 1710  BP: (!) 123/47 (!) 94/36 (!) 100/31 (!) 140/53  Pulse: (!) 50  (!) 50 (!) 52  Resp: 20  20 18   Temp:  98.5 F (36.9 C)  98.9 F (37.2 C)  TempSrc:  Oral  Oral  SpO2: 100%  100%   Weight:      Height:       No intake or output data in the 24 hours ending 04/04/21 1843 Filed Weights   04/01/21 1525 04/03/21 0443 04/04/21 0500  Weight: 112.9 kg 114 kg 114.9 kg    Examination: General exam: Awake, laying in bed, in nad Respiratory system: Normal respiratory effort, no wheezing Cardiovascular system:  regular rate, s1, s2 Gastrointestinal system: Soft, nondistended, positive BS Central nervous system: CN2-12 grossly intact, strength intact Extremities: Perfused, no clubbing Skin: Normal skin turgor, no notable skin lesions seen Psychiatry: Mood normal // no visual hallucinations   Data Reviewed: I have personally reviewed following labs and imaging studies  CBC: Recent Labs  Lab 03/30/21 2239 03/31/21 0754 04/01/21 0221 04/02/21 0224 04/04/21 0002  WBC 15.2* 11.6* 6.2 7.4 11.9*  NEUTROABS 13.2*  --   --   --   --   HGB 14.6 13.9 14.5 14.0 14.7  HCT 45.0 43.7 44.1 42.9 43.7  MCV 90.9 91.2 89.3 89.7 89.7  PLT 237 237 165 204 184   Basic Metabolic Panel: Recent Labs  Lab 03/31/21 0754 04/01/21 0221 04/02/21 0224 04/03/21 0226  04/04/21 0002  NA 130* 132* 136 134* 131*  K 3.8 3.4* 3.6 3.1* 4.0  CL 90* 91* 91* 87* 88*  CO2 31 31 37* 38* 32  GLUCOSE 128* 109* 96 109* 144*  BUN 10 10 17 16 22   CREATININE 0.79 0.80 0.89 0.90 1.23  CALCIUM 8.8* 8.8* 8.9 8.8* 9.1  MG  --  1.7  --  1.9  --    GFR: Estimated Creatinine Clearance: 72.9 mL/min (by C-G formula based on SCr of 1.23 mg/dL). Liver Function Tests: Recent Labs  Lab 04/01/21 0221 04/02/21 0224 04/03/21 0226 04/04/21 0002  AST 22 24 18 21   ALT 16 16 17 20   ALKPHOS 104 96 90 101  BILITOT 2.6* 1.5* 1.5* 2.8*  PROT 6.4* 6.6 6.2* 7.0  ALBUMIN 3.2* 3.2* 3.1* 3.4*   No results for input(s): LIPASE, AMYLASE in the last 168 hours. No results for input(s): AMMONIA in the last 168 hours. Coagulation Profile: No results for input(s): INR, PROTIME in the last 168 hours. Cardiac Enzymes: No results for input(s): CKTOTAL, CKMB, CKMBINDEX, TROPONINI in the last 168 hours. BNP (last 3 results) No results for input(s): PROBNP in the last 8760 hours. HbA1C: No results for input(s): HGBA1C in the last 72 hours. CBG: Recent Labs  Lab 04/03/21 1543 04/03/21 2109 04/04/21 0814 04/04/21 1139 04/04/21 1709  GLUCAP 167* 123* 231* 119* 93   Lipid Profile: No results for input(s): CHOL, HDL, LDLCALC, TRIG, CHOLHDL, LDLDIRECT in the last 72 hours. Thyroid Function Tests: No results for input(s): TSH, T4TOTAL, FREET4, T3FREE, THYROIDAB in the last 72 hours. Anemia Panel: No results for input(s): VITAMINB12, FOLATE, FERRITIN, TIBC, IRON, RETICCTPCT in the last 72 hours. Sepsis Labs: No results for input(s): PROCALCITON, LATICACIDVEN in the last 168 hours.  Recent Results (from the past 240 hour(s))  Resp Panel by RT-PCR (Flu A&B, Covid) Nasopharyngeal Swab     Status: None   Collection Time: 03/30/21 10:39 PM   Specimen: Nasopharyngeal Swab; Nasopharyngeal(NP) swabs in vial transport medium  Result Value Ref Range Status   SARS Coronavirus 2 by RT PCR  NEGATIVE NEGATIVE Final    Comment: (NOTE) SARS-CoV-2 target nucleic acids are NOT DETECTED.  The SARS-CoV-2 RNA is generally detectable in upper respiratory specimens during the acute phase of infection. The lowest concentration of SARS-CoV-2 viral copies this assay can detect is 138 copies/mL. A negative result does not preclude SARS-Cov-2 infection and should not be used as the sole basis for treatment or other patient management decisions. A negative result may occur with  improper specimen collection/handling, submission of specimen other than nasopharyngeal swab, presence of viral mutation(s) within the areas targeted by this assay, and inadequate number of viral copies(<138 copies/mL).  A negative result must be combined with clinical observations, patient history, and epidemiological information. The expected result is Negative.  Fact Sheet for Patients:  BloggerCourse.com  Fact Sheet for Healthcare Providers:  SeriousBroker.it  This test is no t yet approved or cleared by the [place] FDA and  has been authorized for detection and/or diagnosis of SARS-CoV-2 by FDA under an Emergency Use Authorization (EUA). This EUA will remain  in effect (meaning this test can be used) for the duration of the COVID-19 declaration under Section 564(b)(1) of the Act, 21 U.S.C.section 360bbb-3(b)(1), unless the authorization is terminated  or revoked sooner.       Influenza A by PCR NEGATIVE NEGATIVE Final   Influenza B by PCR NEGATIVE NEGATIVE Final    Comment: (NOTE) The Xpert Xpress SARS-CoV-2/FLU/RSV plus assay is intended as an aid in the diagnosis of influenza from Nasopharyngeal swab specimens and should not be used as a sole basis for treatment. Nasal washings and aspirates are unacceptable for Xpert Xpress SARS-CoV-2/FLU/RSV testing.  Fact Sheet for Patients: BloggerCourse.com  Fact Sheet for  Healthcare Providers: SeriousBroker.it  This test is not yet approved or cleared by the [place] FDA and has been authorized for detection and/or diagnosis of SARS-CoV-2 by FDA under an Emergency Use Authorization (EUA). This EUA will remain in effect (meaning this test can be used) for the duration of the COVID-19 declaration under Section 564(b)(1) of the Act, 21 U.S.C. section 360bbb-3(b)(1), unless the authorization is terminated or revoked.  Performed at [facility], [address], [place], [place] [address]   MRSA PCR Screening     Status: None   Collection Time: 03/31/21  3:00 PM   Specimen: Nasal Mucosa; Nasopharyngeal  Result Value Ref Range Status   MRSA by PCR NEGATIVE NEGATIVE Final    Comment:        The GeneXpert MRSA Assay (FDA approved for NASAL specimens only), is one component of a comprehensive MRSA colonization surveillance program. It is not intended to diagnose MRSA infection nor to guide or monitor treatment for MRSA infections. Performed at [facility], [address], [place], [place] [address]   Culture, blood (routine x 2)     Status: None (Preliminary result)   Collection Time: 04/04/21 12:15 AM   Specimen: BLOOD LEFT ARM  Result Value Ref Range Status   Specimen Description BLOOD LEFT ARM  Final   Special Requests   Final    BOTTLES DRAWN AEROBIC AND ANAEROBIC Blood Culture adequate volume Performed at [facility], [address], [place], [place] [address]    Culture PENDING  Incomplete   Report Status PENDING  Incomplete  Culture, blood (routine x 2)     Status: None (Preliminary result)   Collection Time: 04/04/21 12:17 AM   Specimen: BLOOD RIGHT WRIST  Result Value Ref Range Status   Specimen Description BLOOD RIGHT WRIST  Final   Special Requests   Final    BOTTLES DRAWN AEROBIC AND ANAEROBIC Blood Culture adequate volume   Culture  Setup Time   Final    GRAM POSITIVE COCCI IN  CHAINS ANAEROBIC BOTTLE ONLY Organism ID to follow Performed at [facility], [address] [place], [place] [address]    Culture GRAM POSITIVE COCCI  Final   Report Status PENDING  Incomplete     Radiology Studies: DG Chest 1 View  Result Date: 04/03/2021 CLINICAL DATA:  Pneumonia EXAM: CHEST  1 VIEW COMPARISON:  Chest x-rays dated 03/30/2021 and 10/30/2020. FINDINGS: Heart size and mediastinal contours are stable. LEFT chest wall pacemaker/ICD hardware appears stable. Coarse lung markings again noted bilaterally, stable, and emphysematous changes at the lung apices. No confluent opacity to suggest a superimposed pneumonia. No pleural effusion or pneumothorax is seen. IMPRESSION: 1. No active disease. No evidence of pneumonia or pulmonary edema. 2. Emphysema. Associated chronic bronchitic changes and/or chronic interstitial lung disease within the lower lung zones. Electronically Signed   By: Bary Richard M.D.   On: 04/03/2021 23:32    Scheduled Meds: . aspirin EC  81 mg Oral Daily  . atorvastatin  80 mg Oral Daily  . enoxaparin (LOVENOX) injection  0.5 mg/kg Subcutaneous Q24H  . ibuprofen  600 mg Oral Once  . insulin aspart  0-5 Units Subcutaneous QHS  . insulin aspart  0-9 Units Subcutaneous TID WC  . lisinopril  20 mg Oral Daily  . multivitamin with minerals  1 tablet Oral Daily  . sodium chloride flush  3 mL Intravenous Q12H   Continuous Infusions: . sodium chloride       LOS: 4 days   Rickey Barbara, MD [facility] Pager On Amion  If 7PM-7AM, please contact night-coverage 04/04/2021, 6:43 PM

## 2021-04-04 NOTE — Progress Notes (Addendum)
Pharmacy Antibiotic Note  Nathan Miller is a 70 y.o. male admitted on 03/30/2021 with bacteremia.  Pharmacy has been consulted for vancomycin dosing. -Blood cultures 2/4 with GPC in chains and BCID with streptococcus species -CrCl ~ 70  Plan: -Vancomycin 2000mg  IV x1 followed by 1500mg  IV q24h (estimated AUC= 494) -Will follow renal function, cultures and clinical progress   Height: 5\' 9"  (175.3 cm) (pt stated) Weight: 114.9 kg (253 lb 4.9 oz) IBW/kg (Calculated) : 70.7  Temp (24hrs), Avg:99.2 F (37.3 C), Min:98.5 F (36.9 C), Max:100.5 F (38.1 C)  Recent Labs  Lab 03/30/21 2239 03/31/21 0754 04/01/21 0221 04/02/21 0224 04/03/21 0226 04/04/21 0002  WBC 15.2* 11.6* 6.2 7.4  --  11.9*  CREATININE 0.75 0.79 0.80 0.89 0.90 1.23    Estimated Creatinine Clearance: 72.9 mL/min (by C-G formula based on SCr of 1.23 mg/dL).    Allergies  Allergen Reactions  . Penicillins Shortness Of Breath    Did it involve swelling of the face/tongue/throat, SOB, or low BP? Y Did it involve sudden or severe rash/hives, skin peeling, or any reaction on the inside of your mouth or nose? Y Did you need to seek medical attention at a hospital or doctor's office? N When did it last happen?Several decades ago. If all above answers are "NO", may proceed with cephalosporin use.      Thank you for allowing pharmacy to be a part of this patient's care.   Hildred Laser, PharmD Clinical Pharmacist **Pharmacist phone directory can now be found on [email or web address] ([email or web address]).  Listed under [place].   e

## 2021-04-04 NOTE — Care Management Important Message (Signed)
Important Message  Patient Details  Name: Nathan Miller MRN: [id number] Date of Birth: July 01, 1951   Medicare Important Message Given:  Yes     Orbie Pyo 04/04/2021, 4:04 PM

## 2021-04-04 NOTE — Progress Notes (Signed)
PHARMACY - PHYSICIAN COMMUNICATION CRITICAL VALUE ALERT - BLOOD CULTURE IDENTIFICATION (BCID)  Nathan Miller is an 70 y.o. male who presented to [facility] on 03/30/2021 with a chief complaint of SOB/CP  Assessment: Blood cultures 2/4 with GPC in chains and BCID with streptococcus species  Name of physician (or Provider) Contacted: Dr. Cyd Silence  Current antibiotics: None  Changes to prescribed antibiotics recommended: Vancomycin (due to SOB with PCN and no documentation of cephalosporin tolterance Recommendations accepted by provider  Results for orders placed or performed during the hospital encounter of 03/30/21  Blood Culture ID Panel (Reflexed) (Collected: 04/04/2021 12:17 AM)  Result Value Ref Range   Enterococcus faecalis NOT DETECTED NOT DETECTED   Enterococcus Faecium NOT DETECTED NOT DETECTED   Listeria monocytogenes NOT DETECTED NOT DETECTED   Staphylococcus species NOT DETECTED NOT DETECTED   Staphylococcus aureus (BCID) NOT DETECTED NOT DETECTED   Staphylococcus epidermidis NOT DETECTED NOT DETECTED   Staphylococcus lugdunensis NOT DETECTED NOT DETECTED   Streptococcus species DETECTED (A) NOT DETECTED   Streptococcus agalactiae NOT DETECTED NOT DETECTED   Streptococcus pneumoniae NOT DETECTED NOT DETECTED   Streptococcus pyogenes NOT DETECTED NOT DETECTED   A.calcoaceticus-baumannii NOT DETECTED NOT DETECTED   Bacteroides fragilis NOT DETECTED NOT DETECTED   Enterobacterales NOT DETECTED NOT DETECTED   Enterobacter cloacae complex NOT DETECTED NOT DETECTED   Escherichia coli NOT DETECTED NOT DETECTED   Klebsiella aerogenes NOT DETECTED NOT DETECTED   Klebsiella oxytoca NOT DETECTED NOT DETECTED   Klebsiella pneumoniae NOT DETECTED NOT DETECTED   Proteus species NOT DETECTED NOT DETECTED   Salmonella species NOT DETECTED NOT DETECTED   Serratia marcescens NOT DETECTED NOT DETECTED   Haemophilus influenzae NOT DETECTED NOT DETECTED   Neisseria meningitidis NOT DETECTED  NOT DETECTED   Pseudomonas aeruginosa NOT DETECTED NOT DETECTED   Stenotrophomonas maltophilia NOT DETECTED NOT DETECTED   Candida albicans NOT DETECTED NOT DETECTED   Candida auris NOT DETECTED NOT DETECTED   Candida glabrata NOT DETECTED NOT DETECTED   Candida krusei NOT DETECTED NOT DETECTED   Candida parapsilosis NOT DETECTED NOT DETECTED   Candida tropicalis NOT DETECTED NOT DETECTED   Cryptococcus neoformans/gattii NOT DETECTED NOT DETECTED   Hildred Laser, PharmD Clinical Pharmacist **Pharmacist phone directory can now be found on [email or web address] ([email or web address]).  Listed under [place].

## 2021-04-04 NOTE — Consult Note (Addendum)
[place] [facility] Nurse Consult Note: Reason for Consult: Consult requested for right foot.  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  He was previously followed by the outpatient wound care center until it improved.  His wife performs dressing changes every other day and patient uses a Silversorb dressing which we do not carry in the [place].  She has brought in supplies from home and is performing the dressing change without assistance. Wound type: Inner middle foot with dry slightly raised white scar tissue from previous wound which has healed. Approx 1X1cm Plantar foot with healing full thickness wound; 2.5X2.5X.1cm, dry dark red woundbed, surrounded by dry yellow slightly raised callous edges.  No odor,drainage, or fluctuance. Dressing procedure/placement/frequency: Topical treatment orders provided as follows: Wife can change dressing to right foot wound every other day and has supplies from home. Please re-consult if further assistance is needed.  Thank-you,  Julien Girt MSN, [place], [place], [place], [place]

## 2021-04-05 ENCOUNTER — Inpatient Hospital Stay (HOSPITAL_COMMUNITY): Payer: PPO

## 2021-04-05 DIAGNOSIS — J9601 Acute respiratory failure with hypoxia: Secondary | ICD-10-CM

## 2021-04-05 LAB — GLUCOSE, CAPILLARY
Glucose-Capillary: 127 mg/dL — ABNORMAL HIGH (ref 70–99)
Glucose-Capillary: 214 mg/dL — ABNORMAL HIGH (ref 70–99)
Glucose-Capillary: 137 mg/dL — ABNORMAL HIGH (ref 70–99)
Glucose-Capillary: 116 mg/dL — ABNORMAL HIGH (ref 70–99)

## 2021-04-05 LAB — COMPREHENSIVE METABOLIC PANEL
ALT: 17 U/L (ref 0–44)
AST: 16 U/L (ref 15–41)
Albumin: 2.8 g/dL — ABNORMAL LOW (ref 3.5–5.0)
Alkaline Phosphatase: 88 U/L (ref 38–126)
Anion gap: 10 (ref 5–15)
BUN: 33 mg/dL — ABNORMAL HIGH (ref 8–23)
CO2: 30 mmol/L (ref 22–32)
Calcium: 8.6 mg/dL — ABNORMAL LOW (ref 8.9–10.3)
Chloride: 86 mmol/L — ABNORMAL LOW (ref 98–111)
Creatinine, Ser: 1.19 mg/dL (ref 0.61–1.24)
GFR, Estimated: >60 mL/min (ref >60)
Glucose, Bld: 123 mg/dL — ABNORMAL HIGH (ref 70–99)
Potassium: 4.1 mmol/L (ref 3.5–5.1)
Sodium: 126 mmol/L — ABNORMAL LOW (ref 135–145)
Total Bilirubin: 2.8 mg/dL — ABNORMAL HIGH (ref 0.3–1.2)
Total Protein: 6.2 g/dL — ABNORMAL LOW (ref 6.5–8.1)

## 2021-04-05 LAB — URINE CULTURE

## 2021-04-05 LAB — CBC
HCT: 37.6 % — ABNORMAL LOW (ref 39.0–52.0)
Hemoglobin: 12.3 g/dL — ABNORMAL LOW (ref 13.0–17.0)
MCH: 29.6 pg (ref 26.0–34.0)
MCHC: 32.7 g/dL (ref 30.0–36.0)
MCV: 90.4 fL (ref 80.0–100.0)
Platelets: 192 10*3/uL (ref 150–400)
RBC: 4.16 MIL/uL — ABNORMAL LOW (ref 4.22–5.81)
RDW: 14.2 % (ref 11.5–15.5)
WBC: 16.2 10*3/uL — ABNORMAL HIGH (ref 4.0–10.5)
nRBC: 0 % (ref 0.0–0.2)

## 2021-04-05 LAB — MAGNESIUM: Magnesium: 1.9 mg/dL (ref 1.7–2.4)

## 2021-04-05 MED ORDER — SODIUM CHLORIDE 0.9 % IV SOLN
2.0000 g | INTRAVENOUS | Status: AC
  Administered 2021-04-05 – 2021-04-06 (×2): 2 g via INTRAVENOUS
  Filled 2021-04-05 (×2): qty 2

## 2021-04-05 NOTE — Progress Notes (Signed)
PROGRESS NOTE    Nathan Miller  ACZ:[id number] DOB: September 04, 1951 DOA: 03/30/2021 PCP: [facility]    Brief Narrative:  70 y.o. male with medical history significant for CAD, heart block s/p implanted pacemaker, DM, CHF who presents by EMS for evaluation of SOB with chest pain. He reports he laid down for a nap around 1 pm on 03/30/21 and when he woke up he had SOB, chills and chest pain. Claimed to have fevers prior to visit. Pt initially required 3LNC in ED, improved after one dose of IV lasix  Assessment & Plan:   Principal Problem:   Acute respiratory failure with hypoxia (HCC) Active Problems:   Essential hypertension   CAD S/P percutaneous coronary angioplasty; bms X 2 to RCA 2001   Complete heart block (HCC)   Diabetes type 2, controlled (HCC)   Cardiac pacemaker in situ   Obesity with body mass index (BMI) of 30.0 to 39.9   Elevated d-dimer   Elevated brain natriuretic peptide (BNP) level  Acute respiratory failure with hypoxia secondary to acute diastolic CHF exacerbation, chronicity is unknown since this is new diagnosis -CTA reviewed, neg for PE -Pt noted to have BNP of 503 on presentation -2d echo performed and reviewed. Findings of normal LVEF with grade 2 diastolic dysfunction -Clinical improvement with IV lasix, now on hold given recent elevated Cr -given new diagnosis of sepsis, would cont to hold lasix for now. May consider resuming oral lasix if clinically improved in the next 24-48hrs -repeat bmet in AM    Essential hypertension -BP soft currently. Coreg, lisinopril, and lasix now on hold -Recheck bmet in  AM    Diabetes type 2, controlled  -Pt had been on metformin at home. -continue SSI coverage while in hospital -.a1c is 6.4    Elevated d-dimer -CTA reviewed, neg for PE    Complete heart block  -Has pacemaker in place -Remains stable at this time    Cardiac pacemaker in situ  - CAD S/P percutaneous coronary  angioplasty; bms X 2 to RCA 2001   Obesity with body mass index (BMI) of 30.0 to 39.9 Chronic  ARF -likely secondary to lasix and lisinopril, now on hold -Cr improved today -Repeat BMET in aM  Strep bacteremia with sepsis not present on admit -Recent fevers with 2/2 blood cultures pos for strep species, WBC up to 16k today -empiric vanc ordered  -Have consulted ID for further recs  DVT prophylaxis: Lovenox subq Code Status: Full Family Communication: pt in room, family not at bedside  Status is: Inpatient  Remains inpatient appropriate because:Inpatient level of care appropriate due to severity of illness   Dispo: The patient is from: Home              Anticipated d/c is to: Home              Patient currently is not medically stable to d/c.   Difficult to place patient No   Consultants:   ID  Procedures:     Antimicrobials: Anti-infectives (From admission, onward)   Start     Dose/Rate Route Frequency Ordered Stop   04/05/21 2330  vancomycin (VANCOREADY) IVPB 1500 mg/300 mL        1,500 mg 150 mL/hr over 120 Minutes Intravenous Every 24 hours 04/04/21 2207     04/04/21 2300  vancomycin (VANCOREADY) IVPB 2000 mg/400 mL        2,000 mg 200 mL/hr over 120 Minutes Intravenous  Once 04/04/21  2207 04/05/21 0052   04/04/21 2245  cefTRIAXone (ROCEPHIN) 2 g in sodium chloride 0.9 % 100 mL IVPB  Status:  Discontinued        2 g 200 mL/hr over 30 Minutes Intravenous Every 24 hours 04/04/21 2147 04/04/21 2201      Subjective: Feels weak, no longer feeling "dizzy"  Objective: Vitals:   04/05/21 0751 04/05/21 0800 04/05/21 1216 04/05/21 1233  BP: (!) 132/41  (!) 95/35 (!) 106/37  Pulse: (!) 50 (!) 50 (!) 50 (!) 50  Resp: (!) 21 (!) 25 20 (!) 24  Temp: 98.7 F (37.1 C)  98.1 F (36.7 C)   TempSrc:      SpO2: 92% 97% (!) 88% 93%  Weight:      Height:        Intake/Output Summary (Last 24 hours) at 04/05/2021 1443 Last data filed at 04/05/2021 1300 Gross per 24  hour  Intake 325 ml  Output --  Net 325 ml   Filed Weights   04/01/21 1525 04/03/21 0443 04/04/21 0500  Weight: 112.9 kg 114 kg 114.9 kg    Examination: General exam: Conversant, in no acute distress Respiratory system: normal chest rise, clear, no audible wheezing Cardiovascular system: regular rhythm, s1-s2 Gastrointestinal system: Nondistended, nontender, pos BS Central nervous system: No seizures, no tremors Extremities: No cyanosis, no joint deformities Skin: No rashes, no pallor Psychiatry: Affect normal // no auditory hallucinations   Data Reviewed: I have personally reviewed following labs and imaging studies  CBC: Recent Labs  Lab 03/30/21 2239 03/31/21 0754 04/01/21 0221 04/02/21 0224 04/04/21 0002 04/05/21 0350  WBC 15.2* 11.6* 6.2 7.4 11.9* 16.2*  NEUTROABS 13.2*  --   --   --   --   --   HGB 14.6 13.9 14.5 14.0 14.7 12.3*  HCT 45.0 43.7 44.1 42.9 43.7 37.6*  MCV 90.9 91.2 89.3 89.7 89.7 90.4  PLT 237 237 165 204 184 192   Basic Metabolic Panel: Recent Labs  Lab 04/01/21 0221 04/02/21 0224 04/03/21 0226 04/04/21 0002 04/05/21 0350  NA 132* 136 134* 131* 126*  K 3.4* 3.6 3.1* 4.0 4.1  CL 91* 91* 87* 88* 86*  CO2 31 37* 38* 32 30  GLUCOSE 109* 96 109* 144* 123*  BUN 10 17 16 22  33*  CREATININE 0.80 0.89 0.90 1.23 1.19  CALCIUM 8.8* 8.9 8.8* 9.1 8.6*  MG 1.7  --  1.9  --  1.9   GFR: Estimated Creatinine Clearance: 75.3 mL/min (by C-G formula based on SCr of 1.19 mg/dL). Liver Function Tests: Recent Labs  Lab 04/01/21 0221 04/02/21 0224 04/03/21 0226 04/04/21 0002 04/05/21 0350  AST 22 24 18 21 16   ALT 16 16 17 20 17   ALKPHOS 104 96 90 101 88  BILITOT 2.6* 1.5* 1.5* 2.8* 2.8*  PROT 6.4* 6.6 6.2* 7.0 6.2*  ALBUMIN 3.2* 3.2* 3.1* 3.4* 2.8*   No results for input(s): LIPASE, AMYLASE in the last 168 hours. No results for input(s): AMMONIA in the last 168 hours. Coagulation Profile: No results for input(s): INR, PROTIME in the last 168  hours. Cardiac Enzymes: No results for input(s): CKTOTAL, CKMB, CKMBINDEX, TROPONINI in the last 168 hours. BNP (last 3 results) No results for input(s): PROBNP in the last 8760 hours. HbA1C: No results for input(s): HGBA1C in the last 72 hours. CBG: Recent Labs  Lab 04/04/21 1139 04/04/21 1709 04/04/21 2037 04/05/21 0729 04/05/21 1209  GLUCAP 119* 93 113* 127* 214*   Lipid Profile: No  results for input(s): CHOL, HDL, LDLCALC, TRIG, CHOLHDL, LDLDIRECT in the last 72 hours. Thyroid Function Tests: No results for input(s): TSH, T4TOTAL, FREET4, T3FREE, THYROIDAB in the last 72 hours. Anemia Panel: No results for input(s): VITAMINB12, FOLATE, FERRITIN, TIBC, IRON, RETICCTPCT in the last 72 hours. Sepsis Labs: No results for input(s): PROCALCITON, LATICACIDVEN in the last 168 hours.  Recent Results (from the past 240 hour(s))  Resp Panel by RT-PCR (Flu A&B, Covid) Nasopharyngeal Swab     Status: None   Collection Time: 03/30/21 10:39 PM   Specimen: Nasopharyngeal Swab; Nasopharyngeal(NP) swabs in vial transport medium  Result Value Ref Range Status   SARS Coronavirus 2 by RT PCR NEGATIVE NEGATIVE Final    Comment: (NOTE) SARS-CoV-2 target nucleic acids are NOT DETECTED.  The SARS-CoV-2 RNA is generally detectable in upper respiratory specimens during the acute phase of infection. The lowest concentration of SARS-CoV-2 viral copies this assay can detect is 138 copies/mL. A negative result does not preclude SARS-Cov-2 infection and should not be used as the sole basis for treatment or other patient management decisions. A negative result may occur with  improper specimen collection/handling, submission of specimen other than nasopharyngeal swab, presence of viral mutation(s) within the areas targeted by this assay, and inadequate number of viral copies(<138 copies/mL). A negative result must be combined with clinical observations, patient history, and  epidemiological information. The expected result is Negative.  Fact Sheet for Patients:  BloggerCourse.com  Fact Sheet for Healthcare Providers:  SeriousBroker.it  This test is no t yet approved or cleared by the [place] FDA and  has been authorized for detection and/or diagnosis of SARS-CoV-2 by FDA under an Emergency Use Authorization (EUA). This EUA will remain  in effect (meaning this test can be used) for the duration of the COVID-19 declaration under Section 564(b)(1) of the Act, 21 U.S.C.section 360bbb-3(b)(1), unless the authorization is terminated  or revoked sooner.       Influenza A by PCR NEGATIVE NEGATIVE Final   Influenza B by PCR NEGATIVE NEGATIVE Final    Comment: (NOTE) The Xpert Xpress SARS-CoV-2/FLU/RSV plus assay is intended as an aid in the diagnosis of influenza from Nasopharyngeal swab specimens and should not be used as a sole basis for treatment. Nasal washings and aspirates are unacceptable for Xpert Xpress SARS-CoV-2/FLU/RSV testing.  Fact Sheet for Patients: BloggerCourse.com  Fact Sheet for Healthcare Providers: SeriousBroker.it  This test is not yet approved or cleared by the [place] FDA and has been authorized for detection and/or diagnosis of SARS-CoV-2 by FDA under an Emergency Use Authorization (EUA). This EUA will remain in effect (meaning this test can be used) for the duration of the COVID-19 declaration under Section 564(b)(1) of the Act, 21 U.S.C. section 360bbb-3(b)(1), unless the authorization is terminated or revoked.  Performed at [facility], [address], [place], [place] [address]   MRSA PCR Screening     Status: None   Collection Time: 03/31/21  3:00 PM   Specimen: Nasal Mucosa; Nasopharyngeal  Result Value Ref Range Status   MRSA by PCR NEGATIVE NEGATIVE Final    Comment:        The GeneXpert MRSA  Assay (FDA approved for NASAL specimens only), is one component of a comprehensive MRSA colonization surveillance program. It is not intended to diagnose MRSA infection nor to guide or monitor treatment for MRSA infections. Performed at [facility], [address], [place], [place] [address]   Culture, blood (routine x  2)     Status: None (Preliminary result)   Collection Time: 04/04/21 12:15 AM   Specimen: BLOOD LEFT ARM  Result Value Ref Range Status   Specimen Description BLOOD LEFT ARM  Final   Special Requests   Final    BOTTLES DRAWN AEROBIC AND ANAEROBIC Blood Culture adequate volume   Culture  Setup Time   Final    GRAM POSITIVE COCCI IN CHAINS IN BOTH AEROBIC AND ANAEROBIC BOTTLES CRITICAL RESULT CALLED TO, READ BACK BY AND VERIFIED WITH: PHARMD ANDREW MEYERS 04/04/2021 AT 2050 A.HUGHES    Culture   Final    GRAM POSITIVE COCCI CULTURE REINCUBATED FOR BETTER GROWTH Performed at [facility], [address] [place], [place] [address]    Report Status PENDING  Incomplete  Culture, blood (routine x 2)     Status: Abnormal (Preliminary result)   Collection Time: 04/04/21 12:17 AM   Specimen: BLOOD RIGHT WRIST  Result Value Ref Range Status   Specimen Description BLOOD RIGHT WRIST  Final   Special Requests   Final    BOTTLES DRAWN AEROBIC AND ANAEROBIC Blood Culture adequate volume   Culture  Setup Time   Final    GRAM POSITIVE COCCI IN CHAINS IN BOTH AEROBIC AND ANAEROBIC BOTTLES CRITICAL RESULT CALLED TO, READ BACK BY AND VERIFIED WITH: PHARMD ANDREW MEYER 04/04/2021 AT 2050 A.HUGHES    Culture (A)  Final    STREPTOCOCCUS ANGINOSIS SUSCEPTIBILITIES TO FOLLOW Performed at [facility], [address] [place], [place] [address]    Report Status PENDING  Incomplete  Blood Culture ID Panel (Reflexed)     Status: Abnormal   Collection Time: 04/04/21 12:17 AM  Result Value Ref Range Status   Enterococcus faecalis NOT DETECTED NOT DETECTED Final    Enterococcus Faecium NOT DETECTED NOT DETECTED Final   Listeria monocytogenes NOT DETECTED NOT DETECTED Final   Staphylococcus species NOT DETECTED NOT DETECTED Final   Staphylococcus aureus (BCID) NOT DETECTED NOT DETECTED Final   Staphylococcus epidermidis NOT DETECTED NOT DETECTED Final   Staphylococcus lugdunensis NOT DETECTED NOT DETECTED Final   Streptococcus species DETECTED (A) NOT DETECTED Final    Comment: Not Enterococcus species, Streptococcus agalactiae, Streptococcus pyogenes, or Streptococcus pneumoniae. CRITICAL RESULT CALLED TO, READ BACK BY AND VERIFIED WITH: PHARMD ANDREW MEYER 04/04/2021 AT 2250 A.HUGHES    Streptococcus agalactiae NOT DETECTED NOT DETECTED Final   Streptococcus pneumoniae NOT DETECTED NOT DETECTED Final   Streptococcus pyogenes NOT DETECTED NOT DETECTED Final   A.calcoaceticus-baumannii NOT DETECTED NOT DETECTED Final   Bacteroides fragilis NOT DETECTED NOT DETECTED Final   Enterobacterales NOT DETECTED NOT DETECTED Final   Enterobacter cloacae complex NOT DETECTED NOT DETECTED Final   Escherichia coli NOT DETECTED NOT DETECTED Final   Klebsiella aerogenes NOT DETECTED NOT DETECTED Final   Klebsiella oxytoca NOT DETECTED NOT DETECTED Final   Klebsiella pneumoniae NOT DETECTED NOT DETECTED Final   Proteus species NOT DETECTED NOT DETECTED Final   Salmonella species NOT DETECTED NOT DETECTED Final   Serratia marcescens NOT DETECTED NOT DETECTED Final   Haemophilus influenzae NOT DETECTED NOT DETECTED Final   Neisseria meningitidis NOT DETECTED NOT DETECTED Final   Pseudomonas aeruginosa NOT DETECTED NOT DETECTED Final   Stenotrophomonas maltophilia NOT DETECTED NOT DETECTED Final   Candida albicans NOT DETECTED NOT DETECTED Final   Candida auris NOT DETECTED NOT DETECTED Final   Candida glabrata NOT DETECTED NOT DETECTED Final   Candida krusei NOT DETECTED NOT DETECTED Final  Candida parapsilosis NOT DETECTED NOT DETECTED Final   Candida  tropicalis NOT DETECTED NOT DETECTED Final   Cryptococcus neoformans/gattii NOT DETECTED NOT DETECTED Final    Comment: Performed at [facility], [address], [place], [place] [address]  Urine Culture     Status: Abnormal   Collection Time: 04/04/21 12:24 PM   Specimen: Urine, Random  Result Value Ref Range Status   Specimen Description URINE, RANDOM  Final   Special Requests   Final    NONE Performed at [facility], [address] [place], [place] [address]    Culture MULTIPLE SPECIES PRESENT, SUGGEST RECOLLECTION (A)  Final   Report Status 04/05/2021 FINAL  Final     Radiology Studies: DG Chest 1 View  Result Date: 04/03/2021 CLINICAL DATA:  Pneumonia EXAM: CHEST  1 VIEW COMPARISON:  Chest x-rays dated 03/30/2021 and 10/30/2020. FINDINGS: Heart size and mediastinal contours are stable. LEFT chest wall pacemaker/ICD hardware appears stable. Coarse lung markings again noted bilaterally, stable, and emphysematous changes at the lung apices. No confluent opacity to suggest a superimposed pneumonia. No pleural effusion or pneumothorax is seen. IMPRESSION: 1. No active disease. No evidence of pneumonia or pulmonary edema. 2. Emphysema. Associated chronic bronchitic changes and/or chronic interstitial lung disease within the lower lung zones. Electronically Signed   By: Bary Richard M.D.   On: 04/03/2021 23:32    Scheduled Meds: . aspirin EC  81 mg Oral Daily  . atorvastatin  80 mg Oral Daily  . enoxaparin (LOVENOX) injection  0.5 mg/kg Subcutaneous Q24H  . ibuprofen  600 mg Oral Once  . insulin aspart  0-5 Units Subcutaneous QHS  . insulin aspart  0-9 Units Subcutaneous TID WC  . lisinopril  20 mg Oral Daily  . multivitamin with minerals  1 tablet Oral Daily  . sodium chloride flush  3 mL Intravenous Q12H   Continuous Infusions: . sodium chloride    . vancomycin       LOS: 5 days   Rickey Barbara, MD [facility] Pager On Amion  If 7PM-7AM, please contact  night-coverage 04/05/2021, 2:43 PM

## 2021-04-05 NOTE — Consult Note (Signed)
[name] Infect[facility]    Date of Admission:  03/30/2021   Total days of antibiotics 2               Reason for Consult: Streptococcus bacteremia    Referring Provider: Marylu Lund, MD Primary Care Provider:   Assessment: Patient presented to the hospital for chest pain and shortness of breath secondary to an acute diastolic heart failure exacerbation.  Also found to have Streptococcus anginosis bacteremia in both aerobic and anaerobic bottles.  The source of his infection is likely from his right foot.  No concern for urinary or respiratory source.  Patient declines IV drug use.   Right foot CT in 2018 revealed chronic osteomyelitis of his right 4th and 5th metatarsal.  His wound does not appear an active infection.  Will obtain x-ray of the right foot to evaluate for active osteomyelitis.  Patient also has a pacemaker that was placed 6 years ago.  The scar appears well-healed without active infection.  TTE was obtained with did not show any obvious vegetations.  Given his pacemaker, will obtain TEE to rule out endocarditis.  Patient was started on vancomycin due to severe reaction to penicillin.  Will change vancomycin to ceftriaxone for a more narrow therapy and low cross reaction between ceftriaxone and oral penicillin.  We will performe penicillin allergy skin test tomorrow.  Plan: 1. Stop vancomycin.  Start ceftriaxone 2. Pending right foot x-ray 3. Ordered TEE.  Spoken to card master to schedule the procedure 4. Penicillin skin test tomorrow  Principal Problem:   Acute respiratory failure with hypoxia (HCC) Active Problems:   Essential hypertension   CAD S/P percutaneous coronary angioplasty; bms X 2 to RCA 2001   Complete heart block (HCC)   Diabetes type 2, controlled (HCC)   Cardiac pacemaker in situ   Obesity with body mass index (BMI) of 30.0 to 39.9   Elevated d-dimer   Elevated brain natriuretic peptide (BNP) level   Scheduled Meds: .  aspirin EC  81 mg Oral Daily  . atorvastatin  80 mg Oral Daily  . enoxaparin (LOVENOX) injection  0.5 mg/kg Subcutaneous Q24H  . ibuprofen  600 mg Oral Once  . insulin aspart  0-5 Units Subcutaneous QHS  . insulin aspart  0-9 Units Subcutaneous TID WC  . multivitamin with minerals  1 tablet Oral Daily  . sodium chloride flush  3 mL Intravenous Q12H   Continuous Infusions: . sodium chloride    . cefTRIAXone (ROCEPHIN)  IV     PRN Meds:.sodium chloride, acetaminophen **OR** acetaminophen, albuterol, hydrALAZINE, ondansetron **OR** ondansetron (ZOFRAN) IV, polyethylene glycol, sodium chloride flush  HPI: Nathan Miller is a 70 y.o. male with past medical history of CAD, heart block status post pacemaker placed 6 years ago, type 2 diabetes, who presented to the ED for chest pain and shortness of breath, likely due to an acute diastolic heart failure exacerbation.  Patient is sitting on recliner chair during examination.  He appears comfortable and in no acute distress.  He states that he is feeling better after starting antibiotic.  Still endorses shortness of breath.  Reports fever.  Denies dysuria, cough, sore throat.  Patient reports wounds on his right foot that has been going on for couple of years.  He has been seeing wound care center and his wife helps bandage the wound at home.  Denies drainage from the wound.  Patient states that he has had  infection of his bone in the right foot in the past and was on a long course of antibiotic.   Patient also reports history of penicillin allergy when he took oral penicillin years ago.  States that he developed rash and shortness of breath with medication.  Review of Systems: Review of Systems  Constitutional: Positive for fever.  Respiratory: Positive for shortness of breath. Negative for cough.   Cardiovascular: Positive for leg swelling.  Genitourinary: Negative for dysuria.  Musculoskeletal: Positive for back pain.    Past Medical History:   Diagnosis Date  . CAD (coronary artery disease)    NSTEMI in 2001. Cardiac cath showed: LAD: 40% proximal, LCX: 70% mid, RCA thrombotic occlusion in mid segment. s/p PCI and 2 overlapped BMSs to RCA.   [name] CHF (congestive heart failure) ([place])   . Diabetes mellitus   . Hypertension   . Osteomyelitis [facility])     Social History   Tobacco Use  . Smoking status: Former Smoker    Packs/day: 0.30    Years: 40.00    Pack years: 12.00    Types: Cigarettes    Start date: 10/16/1968    Quit date: 10/17/2007    Years since quitting: 13.4  . Smokeless tobacco: Current User    Types: Snuff  Vaping Use  . Vaping Use: Never used  Substance Use Topics  . Alcohol use: Yes    Alcohol/week: 42.0 standard drinks    Types: 42 Cans of beer per week    Comment: beer  . Drug use: No    Family History  Problem Relation Age of Onset  . Heart disease Father   . COPD Father   . Heart disease Paternal Grandfather    Allergies  Allergen Reactions  . Penicillins Shortness Of Breath    Did it involve swelling of the face/tongue/throat, SOB, or low BP? Y Did it involve sudden or severe rash/hives, skin peeling, or any reaction on the inside of your mouth or nose? Y Did you need to seek medical attention at a hospital or doctor's office? N When did it last happen?Several decades ago. If all above answers are "NO", may proceed with cephalosporin use.     OBJECTIVE: Blood pressure (!) 128/40, pulse (!) 50, temperature 98.5 F (36.9 C), resp. rate (!) 22, height 5\' 9"  (1.753 m), weight 114.9 kg, SpO2 92 %.  Physical Exam Constitutional:      General: He is not in acute distress.    Appearance: He is ill-appearing. He is not toxic-appearing.  HENT:     Head: Normocephalic.  Eyes:     General: No scleral icterus.       Right eye: No discharge.        Left eye: No discharge.     Conjunctiva/sclera: Conjunctivae normal.  Cardiovascular:     Rate and Rhythm: Normal rate and regular rhythm.      Heart sounds: Normal heart sounds. No murmur heard.     Comments: +1 edema of right lower extremity.  No edema of left lower extremity Pulmonary:     Effort: Pulmonary effort is normal. No respiratory distress.     Breath sounds: Normal breath sounds. No wheezing or rales.  Abdominal:     General: There is distension.  Musculoskeletal:     Comments: See picture for right foot wound.  Right heel is warm to touch.  Venous stasis dermatosis present of right lower extremity  Skin:    General: Skin is warm.  Neurological:     Mental Status: He is alert and oriented to person, place, and time. Mental status is at baseline.  Psychiatric:        Mood and Affect: Mood normal.        Thought Content: Thought content normal.        Judgment: Judgment normal.        Lab Results Lab Results  Component Value Date   WBC 16.2 (H) 04/05/2021   HGB 12.3 (L) 04/05/2021   HCT 37.6 (L) 04/05/2021   MCV 90.4 04/05/2021   PLT 192 04/05/2021    Lab Results  Component Value Date   CREATININE 1.19 04/05/2021   BUN 33 (H) 04/05/2021   NA 126 (L) 04/05/2021   K 4.1 04/05/2021   CL 86 (L) 04/05/2021   CO2 30 04/05/2021    Lab Results  Component Value Date   ALT 17 04/05/2021   AST 16 04/05/2021   ALKPHOS 88 04/05/2021   BILITOT 2.8 (H) 04/05/2021     Microbiology: Recent Results (from the past 240 hour(s))  Resp Panel by RT-PCR (Flu A&B, Covid) Nasopharyngeal Swab     Status: None   Collection Time: 03/30/21 10:39 PM   Specimen: Nasopharyngeal Swab; Nasopharyngeal(NP) swabs in vial transport medium  Result Value Ref Range Status   SARS Coronavirus 2 by RT PCR NEGATIVE NEGATIVE Final    Comment: (NOTE) SARS-CoV-2 target nucleic acids are NOT DETECTED.  The SARS-CoV-2 RNA is generally detectable in upper respiratory specimens during the acute phase of infection. The lowest concentration of SARS-CoV-2 viral copies this assay can detect is 138 copies/mL. A negative result does not  preclude SARS-Cov-2 infection and should not be used as the sole basis for treatment or other patient management decisions. A negative result may occur with  improper specimen collection/handling, submission of specimen other than nasopharyngeal swab, presence of viral mutation(s) within the areas targeted by this assay, and inadequate number of viral copies(<138 copies/mL). A negative result must be combined with clinical observations, patient history, and epidemiological information. The expected result is Negative.  Fact Sheet for Patients:  EntrepreneurPulse.com.au  Fact Sheet for Healthcare Providers:  IncredibleEmployment.be  This test is no t yet approved or cleared by the [place] FDA and  has been authorized for detection and/or diagnosis of SARS-CoV-2 by FDA under an Emergency Use Authorization (EUA). This EUA will remain  in effect (meaning this test can be used) for the duration of the COVID-19 declaration under Section 564(b)(1) of the Act, 21 U.S.C.section 360bbb-3(b)(1), unless the authorization is terminated  or revoked sooner.       Influenza A by PCR NEGATIVE NEGATIVE Final   Influenza B by PCR NEGATIVE NEGATIVE Final    Comment: (NOTE) The Xpert Xpress SARS-CoV-2/FLU/RSV plus assay is intended as an aid in the diagnosis of influenza from Nasopharyngeal swab specimens and should not be used as a sole basis for treatment. Nasal washings and aspirates are unacceptable for Xpert Xpress SARS-CoV-2/FLU/RSV testing.  Fact Sheet for Patients: EntrepreneurPulse.com.au  Fact Sheet for Healthcare Providers: IncredibleEmployment.be  This test is not yet approved or cleared by the [place] FDA and has been authorized for detection and/or diagnosis of SARS-CoV-2 by FDA under an Emergency Use Authorization (EUA). This EUA will remain in effect (meaning this test can be used) for the  duration of the COVID-19 declaration under Section 564(b)(1) of the Act, 21 U.S.C. section 360bbb-3(b)(1), unless the authorization is terminated or revoked.  Performed at [facility]  [facility], [place] [address], [place], [place] [address]   MRSA PCR Screening     Status: None   Collection Time: 03/31/21  3:00 PM   Specimen: Nasal Mucosa; Nasopharyngeal  Result Value Ref Range Status   MRSA by PCR NEGATIVE NEGATIVE Final    Comment:        The GeneXpert MRSA Assay (FDA approved for NASAL specimens only), is one component of a comprehensive MRSA colonization surveillance program. It is not intended to diagnose MRSA infection nor to guide or monitor treatment for MRSA infections. Performed at [facility], [place] [address] [place], [place] [address]   Culture, blood (routine x 2)     Status: None (Preliminary result)   Collection Time: 04/04/21 12:15 AM   Specimen: BLOOD LEFT ARM  Result Value Ref Range Status   Specimen Description BLOOD LEFT ARM  Final   Special Requests   Final    BOTTLES DRAWN AEROBIC AND ANAEROBIC Blood Culture adequate volume   Culture  Setup Time   Final    GRAM POSITIVE COCCI IN CHAINS IN BOTH AEROBIC AND ANAEROBIC BOTTLES CRITICAL RESULT CALLED TO, READ BACK BY AND VERIFIED WITH: PHARMD ANDREW MEYERS 04/04/2021 AT 2050 A.HUGHES    Culture   Final    GRAM POSITIVE COCCI CULTURE REINCUBATED FOR BETTER GROWTH Performed at [facility], [place] [address] [place], [place] [address]    Report Status PENDING  Incomplete  Culture, blood (routine x 2)     Status: Abnormal (Preliminary result)   Collection Time: 04/04/21 12:17 AM   Specimen: BLOOD RIGHT WRIST  Result Value Ref Range Status   Specimen Description BLOOD RIGHT WRIST  Final   Special Requests   Final    BOTTLES DRAWN AEROBIC AND ANAEROBIC Blood Culture adequate volume   Culture  Setup Time   Final    GRAM POSITIVE COCCI IN CHAINS IN BOTH AEROBIC AND ANAEROBIC BOTTLES CRITICAL RESULT  CALLED TO, READ BACK BY AND VERIFIED WITH: PHARMD ANDREW MEYER 04/04/2021 AT 2050 A.HUGHES    Culture (A)  Final    STREPTOCOCCUS ANGINOSIS SUSCEPTIBILITIES TO FOLLOW Performed at [facility], [place] [address] [place], [place] [address]    Report Status PENDING  Incomplete  Blood Culture ID Panel (Reflexed)     Status: Abnormal   Collection Time: 04/04/21 12:17 AM  Result Value Ref Range Status   Enterococcus faecalis NOT DETECTED NOT DETECTED Final   Enterococcus Faecium NOT DETECTED NOT DETECTED Final   Listeria monocytogenes NOT DETECTED NOT DETECTED Final   Staphylococcus species NOT DETECTED NOT DETECTED Final   Staphylococcus aureus (BCID) NOT DETECTED NOT DETECTED Final   Staphylococcus epidermidis NOT DETECTED NOT DETECTED Final   Staphylococcus lugdunensis NOT DETECTED NOT DETECTED Final   Streptococcus species DETECTED (A) NOT DETECTED Final    Comment: Not Enterococcus species, Streptococcus agalactiae, Streptococcus pyogenes, or Streptococcus pneumoniae. CRITICAL RESULT CALLED TO, READ BACK BY AND VERIFIED WITH: PHARMD ANDREW MEYER 04/04/2021 AT 2250 A.HUGHES    Streptococcus agalactiae NOT DETECTED NOT DETECTED Final   Streptococcus pneumoniae NOT DETECTED NOT DETECTED Final   Streptococcus pyogenes NOT DETECTED NOT DETECTED Final   A.calcoaceticus-baumannii NOT DETECTED NOT DETECTED Final   Bacteroides fragilis NOT DETECTED NOT DETECTED Final   Enterobacterales NOT DETECTED NOT DETECTED Final   Enterobacter cloacae complex NOT DETECTED NOT DETECTED Final   Escherichia coli NOT DETECTED NOT DETECTED Final   Klebsiella aerogenes NOT DETECTED NOT DETECTED Final  Klebsiella oxytoca NOT DETECTED NOT DETECTED Final   Klebsiella pneumoniae NOT DETECTED NOT DETECTED Final   Proteus species NOT DETECTED NOT DETECTED Final   Salmonella species NOT DETECTED NOT DETECTED Final   Serratia marcescens NOT DETECTED NOT DETECTED Final   Haemophilus influenzae NOT DETECTED  NOT DETECTED Final   Neisseria meningitidis NOT DETECTED NOT DETECTED Final   Pseudomonas aeruginosa NOT DETECTED NOT DETECTED Final   Stenotrophomonas maltophilia NOT DETECTED NOT DETECTED Final   Candida albicans NOT DETECTED NOT DETECTED Final   Candida auris NOT DETECTED NOT DETECTED Final   Candida glabrata NOT DETECTED NOT DETECTED Final   Candida krusei NOT DETECTED NOT DETECTED Final   Candida parapsilosis NOT DETECTED NOT DETECTED Final   Candida tropicalis NOT DETECTED NOT DETECTED Final   Cryptococcus neoformans/gattii NOT DETECTED NOT DETECTED Final    Comment: Performed at [facility], [place] [address], [place], [place] [address]  Urine Culture     Status: Abnormal   Collection Time: 04/04/21 12:24 PM   Specimen: Urine, Random  Result Value Ref Range Status   Specimen Description URINE, RANDOM  Final   Special Requests   Final    NONE Performed at [facility], [place] [address] [place], [place] [address]    Culture MULTIPLE SPECIES PRESENT, SUGGEST RECOLLECTION (A)  Final   Report Status 04/05/2021 FINAL  Final    Gaylan Gerold, [facility] [phone] pager   [phone] cell 04/05/2021, 3:40 PM

## 2021-04-06 LAB — COMPREHENSIVE METABOLIC PANEL
ALT: 18 U/L (ref 0–44)
AST: 16 U/L (ref 15–41)
Albumin: 2.5 g/dL — ABNORMAL LOW (ref 3.5–5.0)
Alkaline Phosphatase: 74 U/L (ref 38–126)
Anion gap: 9 (ref 5–15)
BUN: 38 mg/dL — ABNORMAL HIGH (ref 8–23)
CO2: 31 mmol/L (ref 22–32)
Calcium: 8.5 mg/dL — ABNORMAL LOW (ref 8.9–10.3)
Chloride: 85 mmol/L — ABNORMAL LOW (ref 98–111)
Creatinine, Ser: 1.09 mg/dL (ref 0.61–1.24)
GFR, Estimated: >60 mL/min (ref >60)
Glucose, Bld: 125 mg/dL — ABNORMAL HIGH (ref 70–99)
Potassium: 3.4 mmol/L — ABNORMAL LOW (ref 3.5–5.1)
Sodium: 125 mmol/L — ABNORMAL LOW (ref 135–145)
Total Bilirubin: 1.7 mg/dL — ABNORMAL HIGH (ref 0.3–1.2)
Total Protein: 6 g/dL — ABNORMAL LOW (ref 6.5–8.1)

## 2021-04-06 LAB — MAGNESIUM: Magnesium: 2.1 mg/dL (ref 1.7–2.4)

## 2021-04-06 LAB — CULTURE, BLOOD (ROUTINE X 2): Special Requests: ADEQUATE

## 2021-04-06 LAB — GLUCOSE, CAPILLARY
Glucose-Capillary: 189 mg/dL — ABNORMAL HIGH (ref 70–99)
Glucose-Capillary: 219 mg/dL — ABNORMAL HIGH (ref 70–99)
Glucose-Capillary: 112 mg/dL — ABNORMAL HIGH (ref 70–99)
Glucose-Capillary: 127 mg/dL — ABNORMAL HIGH (ref 70–99)

## 2021-04-06 LAB — CBC
HCT: 34.5 % — ABNORMAL LOW (ref 39.0–52.0)
Hemoglobin: 11.5 g/dL — ABNORMAL LOW (ref 13.0–17.0)
MCH: 29.5 pg (ref 26.0–34.0)
MCHC: 33.3 g/dL (ref 30.0–36.0)
MCV: 88.5 fL (ref 80.0–100.0)
Platelets: 168 10*3/uL (ref 150–400)
RBC: 3.9 MIL/uL — ABNORMAL LOW (ref 4.22–5.81)
RDW: 14.1 % (ref 11.5–15.5)
WBC: 12.6 10*3/uL — ABNORMAL HIGH (ref 4.0–10.5)
nRBC: 0 % (ref 0.0–0.2)

## 2021-04-06 MED ORDER — PENICILLIN G IN SODIUM CHLORIDE 10,000 UNITS/ML VIAL FOR SKIN TEST (NO CHARGE)
1000.0000 [IU] | Freq: Once | INTRADERMAL | Status: AC
  Administered 2021-04-06: 1000 [IU] via INTRADERMAL
  Filled 2021-04-06: qty 1

## 2021-04-06 MED ORDER — AMOXICILLIN 500 MG PO CAPS
500.0000 mg | ORAL_CAPSULE | Freq: Once | ORAL | Status: AC
  Administered 2021-04-06: 500 mg via ORAL
  Filled 2021-04-06: qty 1

## 2021-04-06 MED ORDER — DIPHENHYDRAMINE HCL 50 MG/ML IJ SOLN: 25.0000 mg | Freq: Once | INTRAMUSCULAR | Status: DC | PRN

## 2021-04-06 MED ORDER — PENICILLIN G IN SODIUM CHLORIDE 10,000 UNITS/ML VIAL FOR SKIN TEST
1000.0000 [IU] | Freq: Once | INTRADERMAL | Status: AC
  Administered 2021-04-06: 1000 [IU] via TOPICAL
  Filled 2021-04-06 (×2): qty 1

## 2021-04-06 MED ORDER — BENZYLPENICILLOYL POLYLYSINE 0.25 ML ID SOLN (NO-CHARGE)
0.1000 mL | Freq: Once | INTRADERMAL | Status: AC
  Administered 2021-04-06: 0.1 mL via INTRADERMAL
  Filled 2021-04-06: qty 0.25

## 2021-04-06 MED ORDER — EPINEPHRINE 0.3 MG/0.3ML IJ SOAJ
0.3000 mg | Freq: Once | INTRAMUSCULAR | Status: DC | PRN
  Filled 2021-04-06: qty 0.6

## 2021-04-06 MED ORDER — SENNOSIDES-DOCUSATE SODIUM 8.6-50 MG PO TABS: 1.0000 | ORAL_TABLET | Freq: Every evening | ORAL | Status: DC | PRN

## 2021-04-06 MED ORDER — SODIUM CHLORIDE 0.9 % IV SOLN
2.0000 g | INTRAVENOUS | Status: DC
  Administered 2021-04-06 – 2021-04-08 (×11): 2 g via INTRAVENOUS
  Filled 2021-04-06: qty 2
  Filled 2021-04-06 (×2): qty 2000
  Filled 2021-04-06: qty 2
  Filled 2021-04-06 (×7): qty 2000
  Filled 2021-04-06: qty 2
  Filled 2021-04-06 (×2): qty 2000
  Filled 2021-04-06: qty 2
  Filled 2021-04-06: qty 2000

## 2021-04-06 MED ORDER — POTASSIUM CHLORIDE CRYS ER 20 MEQ PO TBCR
40.0000 meq | EXTENDED_RELEASE_TABLET | Freq: Once | ORAL | Status: AC
  Administered 2021-04-06: 40 meq via ORAL
  Filled 2021-04-06: qty 2

## 2021-04-06 MED ORDER — SODIUM CHLORIDE 0.9% FLUSH
0.1000 mL | Freq: Once | INTRAVENOUS | Status: AC
  Administered 2021-04-06: 0.1 mL via INTRADERMAL

## 2021-04-06 MED ORDER — SODIUM CHLORIDE 0.9% FLUSH
0.1000 mL | Freq: Once | INTRAVENOUS | Status: AC
  Administered 2021-04-06: 0.1 mL via TOPICAL

## 2021-04-06 MED ORDER — BENZYLPENICILLOYL POLYLYSINE 0.25 ML ID SOLN
0.0600 mL | Freq: Once | INTRADERMAL | Status: AC
  Administered 2021-04-06: 0.06 mL via TOPICAL
  Filled 2021-04-06: qty 0.06

## 2021-04-06 MED ORDER — HISTAMINE PHOSPHATE 2.75 MG/ML IJ SOLN
0.1000 mL | Freq: Once | INTRAMUSCULAR | Status: AC
  Administered 2021-04-06: 0.1 mL via TOPICAL
  Filled 2021-04-06: qty 0.1

## 2021-04-06 MED ORDER — HYDRALAZINE HCL 20 MG/ML IJ SOLN: 10.0000 mg | INTRAMUSCULAR | Status: DC | PRN

## 2021-04-06 MED ORDER — DM-GUAIFENESIN ER 30-600 MG PO TB12: 1.0000 | ORAL_TABLET | Freq: Two times a day (BID) | ORAL | Status: DC | PRN

## 2021-04-06 NOTE — Plan of Care (Signed)

## 2021-04-06 NOTE — H&P (View-Only) (Signed)
[facility]  Date of Admission:  03/30/2021   Total days of antibiotics 3         ASSESSMENT: X-ray of right foot was negative for acute osteomyelitis.  Will not proceed with CT or MRI.  Given his strep bacteremia in the setting of pacemaker in place, will proceed with TEE to rule out endocarditis.  Continue IV ceftriaxone.  PLAN: 1. Continue IV ceftriaxone 2. Pending TEE, planned for tomorrow 3. Penicillin allergy test today  Principal Problem:   Acute respiratory failure with hypoxia (HCC) Active Problems:   Essential hypertension   CAD S/P percutaneous coronary angioplasty; bms X 2 to RCA 2001   Complete heart block (HCC)   Diabetes type 2, controlled (HCC)   Cardiac pacemaker in situ   Obesity with body mass index (BMI) of 30.0 to 39.9   Elevated d-dimer   Elevated brain natriuretic peptide (BNP) level   Scheduled Meds: . aspirin EC  81 mg Oral Daily  . atorvastatin  80 mg Oral Daily  . benzylpenicilloyl polylysine  0.1 mL Intradermal Once  . benzylpenicilloyl polylysine  0.1 mL Intradermal Once  . benzylpenicilloyl polylysine  0.06 mL Topical Once  . enoxaparin (LOVENOX) injection  0.5 mg/kg Subcutaneous Q24H  . histamine phosphate  0.1 mL Topical Once  . ibuprofen  600 mg Oral Once  . insulin aspart  0-5 Units Subcutaneous QHS  . insulin aspart  0-9 Units Subcutaneous TID WC  . multivitamin with minerals  1 tablet Oral Daily  . penicillin G in sodium chloride 10,000 units/mL vial for skin test  1,000 Units Intradermal Once  . penicillin G in sodium chloride 10,000 units/mL vial for skin test  1,000 Units Intradermal Once  . penicillin G in sodium chloride 10,000 units/mL vial for skin test  1,000 Units Topical Once  . sodium chloride flush  0.1 mL Topical Once  . sodium chloride flush  0.1 mL Intradermal Once  . sodium chloride flush  3 mL Intravenous Q12H   Continuous Infusions: . sodium chloride    . cefTRIAXone (ROCEPHIN)   IV 2 g (04/05/21 1804)   PRN Meds:.sodium chloride, acetaminophen **OR** acetaminophen, albuterol, dextromethorphan-guaiFENesin, diphenhydrAMINE, EPINEPHrine, hydrALAZINE, ondansetron **OR** ondansetron (ZOFRAN) IV, polyethylene glycol, senna-docusate, sodium chloride flush   SUBJECTIVE: Patient is sitting on reclining chair during examination.  He appears comfortable and in no acute distress.  He denies any complaints.  States that his breathing is fine.  States that he is ready to go home.  Review of Systems: Review of Systems  Respiratory: Negative for shortness of breath.     Allergies  Allergen Reactions  . Penicillins Shortness Of Breath    Did it involve swelling of the face/tongue/throat, SOB, or low BP? Y Did it involve sudden or severe rash/hives, skin peeling, or any reaction on the inside of your mouth or nose? Y Did you need to seek medical attention at a hospital or doctor's office? N When did it last happen?Several decades ago. If all above answers are "NO", may proceed with cephalosporin use.     OBJECTIVE: Vitals:   04/06/21 0358 04/06/21 0402 04/06/21 0817 04/06/21 0852  BP: 102/65  (!) 118/37 (!) 125/41  Pulse: 64   (!) 52  Resp: 20  (!) 21 (!) 22  Temp: 99.7 F (37.6 C)   98.1 F (36.7 C)  TempSrc: Oral   Oral  SpO2:   90% 91%  Weight:  115.3  kg    Height:       Body mass index is 37.54 kg/m.  Physical Exam Constitutional:      General: He is not in acute distress.    Appearance: He is not toxic-appearing.  HENT:     Head: Normocephalic.  Cardiovascular:     Rate and Rhythm: Regular rhythm. Bradycardia present.     Heart sounds: No murmur heard.   Pulmonary:     Effort: Pulmonary effort is normal. No respiratory distress.  Abdominal:     General: Bowel sounds are normal. There is distension (Mildly distended).     Tenderness: There is no abdominal tenderness.     Comments: Umbilical hernia noted  Musculoskeletal:        General:  Normal range of motion.  Skin:    General: Skin is warm.     Coloration: Skin is not jaundiced.  Neurological:     Mental Status: He is alert and oriented to person, place, and time. Mental status is at baseline.  Psychiatric:        Mood and Affect: Mood normal.        Thought Content: Thought content normal.        Judgment: Judgment normal.     Lab Results Lab Results  Component Value Date   WBC 12.6 (H) 04/06/2021   HGB 11.5 (L) 04/06/2021   HCT 34.5 (L) 04/06/2021   MCV 88.5 04/06/2021   PLT 168 04/06/2021    Lab Results  Component Value Date   CREATININE 1.09 04/06/2021   BUN 38 (H) 04/06/2021   NA 125 (L) 04/06/2021   K 3.4 (L) 04/06/2021   CL 85 (L) 04/06/2021   CO2 31 04/06/2021    Lab Results  Component Value Date   ALT 18 04/06/2021   AST 16 04/06/2021   ALKPHOS 74 04/06/2021   BILITOT 1.7 (H) 04/06/2021     Microbiology: Recent Results (from the past 240 hour(s))  Resp Panel by RT-PCR (Flu A&B, Covid) Nasopharyngeal Swab     Status: None   Collection Time: 03/30/21 10:39 PM   Specimen: Nasopharyngeal Swab; Nasopharyngeal(NP) swabs in vial transport medium  Result Value Ref Range Status   SARS Coronavirus 2 by RT PCR NEGATIVE NEGATIVE Final    Comment: (NOTE) SARS-CoV-2 target nucleic acids are NOT DETECTED.  The SARS-CoV-2 RNA is generally detectable in upper respiratory specimens during the acute phase of infection. The lowest concentration of SARS-CoV-2 viral copies this assay can detect is 138 copies/mL. A negative result does not preclude SARS-Cov-2 infection and should not be used as the sole basis for treatment or other patient management decisions. A negative result may occur with  improper specimen collection/handling, submission of specimen other than nasopharyngeal swab, presence of viral mutation(s) within the areas targeted by this assay, and inadequate number of viral copies(<138 copies/mL). A negative result must be combined  with clinical observations, patient history, and epidemiological information. The expected result is Negative.  Fact Sheet for Patients:  EntrepreneurPulse.com.au  Fact Sheet for Healthcare Providers:  IncredibleEmployment.be  This test is no t yet approved or cleared by the [place] FDA and  has been authorized for detection and/or diagnosis of SARS-CoV-2 by FDA under an Emergency Use Authorization (EUA). This EUA will remain  in effect (meaning this test can be used) for the duration of the COVID-19 declaration under Section 564(b)(1) of the Act, 21 U.S.C.section 360bbb-3(b)(1), unless the authorization is terminated  or revoked sooner.  Influenza A by PCR NEGATIVE NEGATIVE Final   Influenza B by PCR NEGATIVE NEGATIVE Final    Comment: (NOTE) The Xpert Xpress SARS-CoV-2/FLU/RSV plus assay is intended as an aid in the diagnosis of influenza from Nasopharyngeal swab specimens and should not be used as a sole basis for treatment. Nasal washings and aspirates are unacceptable for Xpert Xpress SARS-CoV-2/FLU/RSV testing.  Fact Sheet for Patients: EntrepreneurPulse.com.au  Fact Sheet for Healthcare Providers: IncredibleEmployment.be  This test is not yet approved or cleared by the [place] FDA and has been authorized for detection and/or diagnosis of SARS-CoV-2 by FDA under an Emergency Use Authorization (EUA). This EUA will remain in effect (meaning this test can be used) for the duration of the COVID-19 declaration under Section 564(b)(1) of the Act, 21 U.S.C. section 360bbb-3(b)(1), unless the authorization is terminated or revoked.  Performed at [facility], [place] [address], [place], [place] [address]   MRSA PCR Screening     Status: None   Collection Time: 03/31/21  3:00 PM   Specimen: Nasal Mucosa; Nasopharyngeal  Result Value Ref Range Status   MRSA by PCR NEGATIVE NEGATIVE  Final    Comment:        The GeneXpert MRSA Assay (FDA approved for NASAL specimens only), is one component of a comprehensive MRSA colonization surveillance program. It is not intended to diagnose MRSA infection nor to guide or monitor treatment for MRSA infections. Performed at [facility], [place] [address], [place], [place] [address]   Culture, blood (routine x 2)     Status: None (Preliminary result)   Collection Time: 04/04/21 12:15 AM   Specimen: BLOOD LEFT ARM  Result Value Ref Range Status   Specimen Description BLOOD LEFT ARM  Final   Special Requests   Final    BOTTLES DRAWN AEROBIC AND ANAEROBIC Blood Culture adequate volume   Culture  Setup Time   Final    GRAM POSITIVE COCCI IN CHAINS IN BOTH AEROBIC AND ANAEROBIC BOTTLES CRITICAL RESULT CALLED TO, READ BACK BY AND VERIFIED WITH: PHARMD ANDREW MEYERS 04/04/2021 AT 2050 A.HUGHES    Culture   Final    GRAM POSITIVE COCCI CULTURE REINCUBATED FOR BETTER GROWTH Performed at [facility], [place] [address] [place], [place] [address]    Report Status PENDING  Incomplete  Culture, blood (routine x 2)     Status: Abnormal (Preliminary result)   Collection Time: 04/04/21 12:17 AM   Specimen: BLOOD RIGHT WRIST  Result Value Ref Range Status   Specimen Description BLOOD RIGHT WRIST  Final   Special Requests   Final    BOTTLES DRAWN AEROBIC AND ANAEROBIC Blood Culture adequate volume   Culture  Setup Time   Final    GRAM POSITIVE COCCI IN CHAINS IN BOTH AEROBIC AND ANAEROBIC BOTTLES CRITICAL RESULT CALLED TO, READ BACK BY AND VERIFIED WITH: PHARMD ANDREW MEYER 04/04/2021 AT 2050 A.HUGHES    Culture (A)  Final    STREPTOCOCCUS ANGINOSIS SUSCEPTIBILITIES TO FOLLOW Performed at [facility], [place] [address], [place], [place] [address]    Report Status PENDING  Incomplete  Blood Culture ID Panel (Reflexed)     Status: Abnormal   Collection Time: 04/04/21 12:17 AM  Result Value Ref Range Status    Enterococcus faecalis NOT DETECTED NOT DETECTED Final   Enterococcus Faecium NOT DETECTED NOT DETECTED Final   Listeria monocytogenes NOT DETECTED NOT DETECTED Final   Staphylococcus species NOT DETECTED NOT DETECTED Final  Staphylococcus aureus (BCID) NOT DETECTED NOT DETECTED Final   Staphylococcus epidermidis NOT DETECTED NOT DETECTED Final   Staphylococcus lugdunensis NOT DETECTED NOT DETECTED Final   Streptococcus species DETECTED (A) NOT DETECTED Final    Comment: Not Enterococcus species, Streptococcus agalactiae, Streptococcus pyogenes, or Streptococcus pneumoniae. CRITICAL RESULT CALLED TO, READ BACK BY AND VERIFIED WITH: PHARMD ANDREW MEYER 04/04/2021 AT 2250 A.HUGHES    Streptococcus agalactiae NOT DETECTED NOT DETECTED Final   Streptococcus pneumoniae NOT DETECTED NOT DETECTED Final   Streptococcus pyogenes NOT DETECTED NOT DETECTED Final   A.calcoaceticus-baumannii NOT DETECTED NOT DETECTED Final   Bacteroides fragilis NOT DETECTED NOT DETECTED Final   Enterobacterales NOT DETECTED NOT DETECTED Final   Enterobacter cloacae complex NOT DETECTED NOT DETECTED Final   Escherichia coli NOT DETECTED NOT DETECTED Final   Klebsiella aerogenes NOT DETECTED NOT DETECTED Final   Klebsiella oxytoca NOT DETECTED NOT DETECTED Final   Klebsiella pneumoniae NOT DETECTED NOT DETECTED Final   Proteus species NOT DETECTED NOT DETECTED Final   Salmonella species NOT DETECTED NOT DETECTED Final   Serratia marcescens NOT DETECTED NOT DETECTED Final   Haemophilus influenzae NOT DETECTED NOT DETECTED Final   Neisseria meningitidis NOT DETECTED NOT DETECTED Final   Pseudomonas aeruginosa NOT DETECTED NOT DETECTED Final   Stenotrophomonas maltophilia NOT DETECTED NOT DETECTED Final   Candida albicans NOT DETECTED NOT DETECTED Final   Candida auris NOT DETECTED NOT DETECTED Final   Candida glabrata NOT DETECTED NOT DETECTED Final   Candida krusei NOT DETECTED NOT DETECTED Final   Candida  parapsilosis NOT DETECTED NOT DETECTED Final   Candida tropicalis NOT DETECTED NOT DETECTED Final   Cryptococcus neoformans/gattii NOT DETECTED NOT DETECTED Final    Comment: Performed at [facility], [address] [place], [place] [address]  Urine Culture     Status: Abnormal   Collection Time: 04/04/21 12:24 PM   Specimen: Urine, Random  Result Value Ref Range Status   Specimen Description URINE, RANDOM  Final   Special Requests   Final    NONE Performed at [facility], [place] [address] [place], [place] [address]    Culture MULTIPLE SPECIES PRESENT, SUGGEST RECOLLECTION (A)  Final   Report Status 04/05/2021 FINAL  Final    Gaylan Gerold, [facility] [phone] pager   [phone] cell 04/06/2021, 11:14 AM

## 2021-04-06 NOTE — Progress Notes (Signed)
PROGRESS NOTE    FELICIANO BABCOCK  ZOX:[id number] DOB: 14-Apr-1951 DOA: 03/30/2021 PCP: [facility]   Brief Narrative:  70 y.o.malewith medical history significant forCAD, heart block s/p implanted pacemaker, DM, CHF who presents by EMS for evaluation of SOB with chest pain. He reports he laid down for a nap around 1 pm on 03/30/21 and when he woke up he had SOB, chills and chest pain. Claimed to have fevers prior to visit. Pt initially required 3LNC in ED, improved after one dose of IV lasix   Assessment & Plan:   Principal Problem:   Acute respiratory failure with hypoxia (HCC) Active Problems:   Essential hypertension   CAD S/P percutaneous coronary angioplasty; bms X 2 to RCA 2001   Complete heart block (HCC)   Diabetes type 2, controlled (HCC)   Cardiac pacemaker in situ   Obesity with body mass index (BMI) of 30.0 to 39.9   Elevated d-dimer   Elevated brain natriuretic peptide (BNP) level  Strep bacteremia with sepsis not present on admit -Sepsis not present on admission.  Cultures growing Streptococcus.  WBC improving - Empiric IV Rocephin.  No vegetation on echo - Monitor culture data. Will repeat today -N.p.o. past midnight for TEE tomorrow - ID following.  Acute hypoxic respiratory failure secondary to CHF, improved Acute congestive heart failure with preserved ejection fraction, EF 55%, grade 2 DD, improved -Improved with IV diuretics. -CTA chest negative for PE - Echocardiogram- EF 55 to 60%, grade 2 DD - Hold off on further IV Lasix.  Monitor clinically and labs  Essential hypertension -Due to soft blood pressure Coreg, lisinopril and Lasix on hold  Diabetes mellitus type 2 -A1c 6.4.  Home metformin on hold - Insulin sliding scale and Accu-Chek  Elevated D-dimer -CTA chest negative for PE  Complete heart block  History of CAD status post PCI 2001 -Pacemaker in place.  Chest pain-free - Lipitor 80 mg daily  ARF,  improved -likely secondary to lasix and lisinopril, now on hold -Cr improved today -Repeat BMET in aM     DVT prophylaxis: SQ Lovenox  Code Status: Full Family Communication:    Status is: Inpatient  Remains inpatient appropriate because:Inpatient level of care appropriate due to severity of illness   Dispo: The patient is from: Home              Anticipated d/c is to: Home              Patient currently is not medically stable to d/c.  Ongoing evaluation for possible endocarditis.  Getting IV antibiotics.  Plans for TEE   Difficult to place patient No      Subjective: Sitting up in the chair no complaints  Review of Systems Otherwise negative except as per HPI, including: General: Denies fever, chills, night sweats or unintended weight loss. Resp: Denies cough, wheezing, shortness of breath. Cardiac: Denies chest pain, palpitations, orthopnea, paroxysmal nocturnal dyspnea. GI: Denies abdominal pain, nausea, vomiting, diarrhea or constipation GU: Denies dysuria, frequency, hesitancy or incontinence MS: Denies muscle aches, joint pain or swelling Neuro: Denies headache, neurologic deficits (focal weakness, numbness, tingling), abnormal gait Psych: Denies anxiety, depression, SI/HI/AVH Skin: Denies new rashes or lesions ID: Denies sick contacts, exotic exposures, travel  Examination:  General exam: Appears calm and comfortable  Respiratory system: Clear to auscultation. Respiratory effort normal. Cardiovascular system: S1 & S2 heard, RRR. No JVD, murmurs, rubs, gallops or clicks. No pedal edema. Gastrointestinal system: Abdomen is nondistended,  soft and nontender. No organomegaly or masses felt. Normal bowel sounds heard. Central nervous system: Alert and oriented. No focal neurological deficits. Extremities: Symmetric 5 x 5 power. Skin: No rashes, lesions or ulcers Psychiatry: Judgement and insight appear normal. Mood & affect appropriate.      Objective: Vitals:   04/05/21 2006 04/06/21 0020 04/06/21 0358 04/06/21 0402  BP: (!) 125/51 (!) 126/46 102/65   Pulse: (!) 50 60 64   Resp: 20 19 20    Temp: 99.1 F (37.3 C) 98.9 F (37.2 C) 99.7 F (37.6 C)   TempSrc: Oral Oral Oral   SpO2: 91%     Weight:    115.3 kg  Height:        Intake/Output Summary (Last 24 hours) at 04/06/2021 0752 Last data filed at 04/05/2021 1300 Gross per 24 hour  Intake 325 ml  Output --  Net 325 ml   Filed Weights   04/03/21 0443 04/04/21 0500 04/06/21 0402  Weight: 114 kg 114.9 kg 115.3 kg     Data Reviewed:   CBC: Recent Labs  Lab 03/30/21 2239 03/31/21 0754 04/01/21 0221 04/02/21 0224 04/04/21 0002 04/05/21 0350 04/06/21 0401  WBC 15.2*   < > 6.2 7.4 11.9* 16.2* 12.6*  NEUTROABS 13.2*  --   --   --   --   --   --   HGB 14.6   < > 14.5 14.0 14.7 12.3* 11.5*  HCT 45.0   < > 44.1 42.9 43.7 37.6* 34.5*  MCV 90.9   < > 89.3 89.7 89.7 90.4 88.5  PLT 237   < > 165 204 184 192 168   < > = values in this interval not displayed.   Basic Metabolic Panel: Recent Labs  Lab 04/01/21 0221 04/02/21 0224 04/03/21 0226 04/04/21 0002 04/05/21 0350 04/06/21 0401  NA 132* 136 134* 131* 126* 125*  K 3.4* 3.6 3.1* 4.0 4.1 3.4*  CL 91* 91* 87* 88* 86* 85*  CO2 31 37* 38* 32 30 31  GLUCOSE 109* 96 109* 144* 123* 125*  BUN 10 17 16 22  33* 38*  CREATININE 0.80 0.89 0.90 1.23 1.19 1.09  CALCIUM 8.8* 8.9 8.8* 9.1 8.6* 8.5*  MG 1.7  --  1.9  --  1.9 2.1   GFR: Estimated Creatinine Clearance: 82.3 mL/min (by C-G formula based on SCr of 1.09 mg/dL). Liver Function Tests: Recent Labs  Lab 04/02/21 0224 04/03/21 0226 04/04/21 0002 04/05/21 0350 04/06/21 0401  AST 24 18 21 16 16   ALT 16 17 20 17 18   ALKPHOS 96 90 101 88 74  BILITOT 1.5* 1.5* 2.8* 2.8* 1.7*  PROT 6.6 6.2* 7.0 6.2* 6.0*  ALBUMIN 3.2* 3.1* 3.4* 2.8* 2.5*   No results for input(s): LIPASE, AMYLASE in the last 168 hours. No results for input(s): AMMONIA in the last  168 hours. Coagulation Profile: No results for input(s): INR, PROTIME in the last 168 hours. Cardiac Enzymes: No results for input(s): CKTOTAL, CKMB, CKMBINDEX, TROPONINI in the last 168 hours. BNP (last 3 results) No results for input(s): PROBNP in the last 8760 hours. HbA1C: No results for input(s): HGBA1C in the last 72 hours. CBG: Recent Labs  Lab 04/04/21 2037 04/05/21 0729 04/05/21 1209 04/05/21 1555 04/05/21 2146  GLUCAP 113* 127* 214* 137* 116*   Lipid Profile: No results for input(s): CHOL, HDL, LDLCALC, TRIG, CHOLHDL, LDLDIRECT in the last 72 hours. Thyroid Function Tests: No results for input(s): TSH, T4TOTAL, FREET4, T3FREE, THYROIDAB in the last 72  hours. Anemia Panel: No results for input(s): VITAMINB12, FOLATE, FERRITIN, TIBC, IRON, RETICCTPCT in the last 72 hours. Sepsis Labs: No results for input(s): PROCALCITON, LATICACIDVEN in the last 168 hours.  Recent Results (from the past 240 hour(s))  Resp Panel by RT-PCR (Flu A&B, Covid) Nasopharyngeal Swab     Status: None   Collection Time: 03/30/21 10:39 PM   Specimen: Nasopharyngeal Swab; Nasopharyngeal(NP) swabs in vial transport medium  Result Value Ref Range Status   SARS Coronavirus 2 by RT PCR NEGATIVE NEGATIVE Final    Comment: (NOTE) SARS-CoV-2 target nucleic acids are NOT DETECTED.  The SARS-CoV-2 RNA is generally detectable in upper respiratory specimens during the acute phase of infection. The lowest concentration of SARS-CoV-2 viral copies this assay can detect is 138 copies/mL. A negative result does not preclude SARS-Cov-2 infection and should not be used as the sole basis for treatment or other patient management decisions. A negative result may occur with  improper specimen collection/handling, submission of specimen other than nasopharyngeal swab, presence of viral mutation(s) within the areas targeted by this assay, and inadequate number of viral copies(<138 copies/mL). A negative result  must be combined with clinical observations, patient history, and epidemiological information. The expected result is Negative.  Fact Sheet for Patients:  BloggerCourse.com  Fact Sheet for Healthcare Providers:  SeriousBroker.it  This test is no t yet approved or cleared by the [place] FDA and  has been authorized for detection and/or diagnosis of SARS-CoV-2 by FDA under an Emergency Use Authorization (EUA). This EUA will remain  in effect (meaning this test can be used) for the duration of the COVID-19 declaration under Section 564(b)(1) of the Act, 21 U.S.C.section 360bbb-3(b)(1), unless the authorization is terminated  or revoked sooner.       Influenza A by PCR NEGATIVE NEGATIVE Final   Influenza B by PCR NEGATIVE NEGATIVE Final    Comment: (NOTE) The Xpert Xpress SARS-CoV-2/FLU/RSV plus assay is intended as an aid in the diagnosis of influenza from Nasopharyngeal swab specimens and should not be used as a sole basis for treatment. Nasal washings and aspirates are unacceptable for Xpert Xpress SARS-CoV-2/FLU/RSV testing.  Fact Sheet for Patients: BloggerCourse.com  Fact Sheet for Healthcare Providers: SeriousBroker.it  This test is not yet approved or cleared by the [place] FDA and has been authorized for detection and/or diagnosis of SARS-CoV-2 by FDA under an Emergency Use Authorization (EUA). This EUA will remain in effect (meaning this test can be used) for the duration of the COVID-19 declaration under Section 564(b)(1) of the Act, 21 U.S.C. section 360bbb-3(b)(1), unless the authorization is terminated or revoked.  Performed at [facility], [address] [place], [place] [address]   MRSA PCR Screening     Status: None   Collection Time: 03/31/21  3:00 PM   Specimen: Nasal Mucosa; Nasopharyngeal  Result Value Ref Range Status   MRSA by PCR  NEGATIVE NEGATIVE Final    Comment:        The GeneXpert MRSA Assay (FDA approved for NASAL specimens only), is one component of a comprehensive MRSA colonization surveillance program. It is not intended to diagnose MRSA infection nor to guide or monitor treatment for MRSA infections. Performed at [facility], [address], [place], [place] [address]   Culture, blood (routine x 2)     Status: None (Preliminary result)   Collection Time: 04/04/21 12:15 AM   Specimen: BLOOD LEFT ARM  Result Value Ref Range Status  Specimen Description BLOOD LEFT ARM  Final   Special Requests   Final    BOTTLES DRAWN AEROBIC AND ANAEROBIC Blood Culture adequate volume   Culture  Setup Time   Final    GRAM POSITIVE COCCI IN CHAINS IN BOTH AEROBIC AND ANAEROBIC BOTTLES CRITICAL RESULT CALLED TO, READ BACK BY AND VERIFIED WITH: PHARMD ANDREW MEYERS 04/04/2021 AT 2050 A.HUGHES    Culture   Final    GRAM POSITIVE COCCI CULTURE REINCUBATED FOR BETTER GROWTH Performed at [facility], [address] [place], [place] [address]    Report Status PENDING  Incomplete  Culture, blood (routine x 2)     Status: Abnormal (Preliminary result)   Collection Time: 04/04/21 12:17 AM   Specimen: BLOOD RIGHT WRIST  Result Value Ref Range Status   Specimen Description BLOOD RIGHT WRIST  Final   Special Requests   Final    BOTTLES DRAWN AEROBIC AND ANAEROBIC Blood Culture adequate volume   Culture  Setup Time   Final    GRAM POSITIVE COCCI IN CHAINS IN BOTH AEROBIC AND ANAEROBIC BOTTLES CRITICAL RESULT CALLED TO, READ BACK BY AND VERIFIED WITH: PHARMD ANDREW MEYER 04/04/2021 AT 2050 A.HUGHES    Culture (A)  Final    STREPTOCOCCUS ANGINOSIS SUSCEPTIBILITIES TO FOLLOW Performed at [facility], [address] [place], [place] [address]    Report Status PENDING  Incomplete  Blood Culture ID Panel (Reflexed)     Status: Abnormal   Collection Time: 04/04/21 12:17 AM  Result Value Ref Range  Status   Enterococcus faecalis NOT DETECTED NOT DETECTED Final   Enterococcus Faecium NOT DETECTED NOT DETECTED Final   Listeria monocytogenes NOT DETECTED NOT DETECTED Final   Staphylococcus species NOT DETECTED NOT DETECTED Final   Staphylococcus aureus (BCID) NOT DETECTED NOT DETECTED Final   Staphylococcus epidermidis NOT DETECTED NOT DETECTED Final   Staphylococcus lugdunensis NOT DETECTED NOT DETECTED Final   Streptococcus species DETECTED (A) NOT DETECTED Final    Comment: Not Enterococcus species, Streptococcus agalactiae, Streptococcus pyogenes, or Streptococcus pneumoniae. CRITICAL RESULT CALLED TO, READ BACK BY AND VERIFIED WITH: PHARMD ANDREW MEYER 04/04/2021 AT 2250 A.HUGHES    Streptococcus agalactiae NOT DETECTED NOT DETECTED Final   Streptococcus pneumoniae NOT DETECTED NOT DETECTED Final   Streptococcus pyogenes NOT DETECTED NOT DETECTED Final   A.calcoaceticus-baumannii NOT DETECTED NOT DETECTED Final   Bacteroides fragilis NOT DETECTED NOT DETECTED Final   Enterobacterales NOT DETECTED NOT DETECTED Final   Enterobacter cloacae complex NOT DETECTED NOT DETECTED Final   Escherichia coli NOT DETECTED NOT DETECTED Final   Klebsiella aerogenes NOT DETECTED NOT DETECTED Final   Klebsiella oxytoca NOT DETECTED NOT DETECTED Final   Klebsiella pneumoniae NOT DETECTED NOT DETECTED Final   Proteus species NOT DETECTED NOT DETECTED Final   Salmonella species NOT DETECTED NOT DETECTED Final   Serratia marcescens NOT DETECTED NOT DETECTED Final   Haemophilus influenzae NOT DETECTED NOT DETECTED Final   Neisseria meningitidis NOT DETECTED NOT DETECTED Final   Pseudomonas aeruginosa NOT DETECTED NOT DETECTED Final   Stenotrophomonas maltophilia NOT DETECTED NOT DETECTED Final   Candida albicans NOT DETECTED NOT DETECTED Final   Candida auris NOT DETECTED NOT DETECTED Final   Candida glabrata NOT DETECTED NOT DETECTED Final   Candida krusei NOT DETECTED NOT DETECTED Final    Candida parapsilosis NOT DETECTED NOT DETECTED Final   Candida tropicalis NOT DETECTED NOT DETECTED Final   Cryptococcus neoformans/gattii NOT DETECTED NOT DETECTED Final  Comment: Performed at [facility], [address], [place], [place] [address]  Urine Culture     Status: Abnormal   Collection Time: 04/04/21 12:24 PM   Specimen: Urine, Random  Result Value Ref Range Status   Specimen Description URINE, RANDOM  Final   Special Requests   Final    NONE Performed at [facility], [address] [place], [place] [address]    Culture MULTIPLE SPECIES PRESENT, SUGGEST RECOLLECTION (A)  Final   Report Status 04/05/2021 FINAL  Final         Radiology Studies: DG Foot Complete Right  Result Date: 04/05/2021 CLINICAL DATA:  Right heel diabetic wound for several months. EXAM: RIGHT FOOT COMPLETE - 3+ VIEW COMPARISON:  01/31/2017. FINDINGS: Previously demonstrated Charcot changes and changes of previous osteomyelitis with areas of bone fusion and severe irregularity and deformity. No areas of acute bone destruction or periosteal reaction seen. Diffuse soft tissue swelling without soft tissue gas. 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 Electronically Signed   By: Beckie Salts M.D.   On: 04/05/2021 20:35        Scheduled Meds: . aspirin EC  81 mg Oral Daily  . atorvastatin  80 mg Oral Daily  . enoxaparin (LOVENOX) injection  0.5 mg/kg Subcutaneous Q24H  . ibuprofen  600 mg Oral Once  . insulin aspart  0-5 Units Subcutaneous QHS  . insulin aspart  0-9 Units Subcutaneous TID WC  . multivitamin with minerals  1 tablet Oral Daily  . sodium chloride flush  3 mL Intravenous Q12H   Continuous Infusions: . sodium chloride    . cefTRIAXone (ROCEPHIN)  IV 2 g (04/05/21 1804)     LOS: 6 days   Time spent= 35 mins    Xaden Kaufman Joline Maxcy,  MD [facility]  If 7PM-7AM, please contact night-coverage  04/06/2021, 7:52 AM

## 2021-04-06 NOTE — Progress Notes (Signed)
Scratch test for penicillin skin test

## 2021-04-06 NOTE — Progress Notes (Signed)
Consent for penicillin skin test

## 2021-04-06 NOTE — Progress Notes (Signed)
Penicillin Skin Testing Assessment  Penicillin allergy skin testing completed on 04/06/21  Penicillin allergy skin testing requested by:Dr. Alfonse Spruce (Infectious Diseases Resident   Patient history of penicillin or beta lactam allergy: Spoke with [name] about his penicillin allergy today. He reports that it was many years ago and he can't remember exactly what happened. He thinks he potentially had shortness of breath and rash. Given the high risk nature of is penicillin allergy we elected to perform a penicillin skin test.   Last use of antihistamine (or other drug affecting response to histamine): None noted   Test Date Product Scratch Width (mm) Intradermal #1 Width (mm) Intradermal #2 Width (mm) Results (Pos/Neg/Ambiguous)  6/8 PRE-PEN (undiluted) 0 mm 1 mm 1 mm Neg Neg  6/8 Penicillin G (10,000 U/mL) 0 mm 1 mm 1 mm Neg Neg  6/8 Dilutent Control 0 mm 1 mm   Neg Neg  6/8 Histamine (1mg /mL) 3 mm      Neg Neg     Criteria for positive prick skin test: Induration > 47mm greater than diluent control Criteria for positive intradermal skin test: Significant increase in size of original bleb with wheal diameter 16mm or more larger than diluent control; itching and flare are commonly present Criteria for negative intradermal skin test: No increase in size of original bleb and no reaction greater than control site Equivocal intradermal skin test: Wheal only slightly larger than initial injection bleb and control site, with or without erythematous flare OR duplicates are discordant. Control site: If wheal >2-3 mm after 20 min, repeat skin test to look for dermatographism     Interpretation: [x]  NEGATIVE for penicillin allergy []  POSITIVE for penicillin allergy         Will plan to do amoxicillin oral challenge this afternoon. Will talk about timing with nursing staff.   Jimmy Footman, PharmD, BCPS, BCIDP Infectious Diseases Clinical Pharmacist Phone: [phone] 04/06/2021 2:23 PM

## 2021-04-06 NOTE — Progress Notes (Signed)
[facility]  Date of Admission:  03/30/2021   Total days of antibiotics 3         ASSESSMENT: X-ray of right foot was negative for acute osteomyelitis.  Will not proceed with CT or MRI.  Given his strep bacteremia in the setting of pacemaker in place, will proceed with TEE to rule out endocarditis.  Continue IV ceftriaxone.  PLAN: 1. Continue IV ceftriaxone 2. Pending TEE, planned for tomorrow 3. Penicillin allergy test today  Principal Problem:   Acute respiratory failure with hypoxia (HCC) Active Problems:   Essential hypertension   CAD S/P percutaneous coronary angioplasty; bms X 2 to RCA 2001   Complete heart block (HCC)   Diabetes type 2, controlled (HCC)   Cardiac pacemaker in situ   Obesity with body mass index (BMI) of 30.0 to 39.9   Elevated d-dimer   Elevated brain natriuretic peptide (BNP) level   Scheduled Meds: . aspirin EC  81 mg Oral Daily  . atorvastatin  80 mg Oral Daily  . benzylpenicilloyl polylysine  0.1 mL Intradermal Once  . benzylpenicilloyl polylysine  0.1 mL Intradermal Once  . benzylpenicilloyl polylysine  0.06 mL Topical Once  . enoxaparin (LOVENOX) injection  0.5 mg/kg Subcutaneous Q24H  . histamine phosphate  0.1 mL Topical Once  . ibuprofen  600 mg Oral Once  . insulin aspart  0-5 Units Subcutaneous QHS  . insulin aspart  0-9 Units Subcutaneous TID WC  . multivitamin with minerals  1 tablet Oral Daily  . penicillin G in sodium chloride 10,000 units/mL vial for skin test  1,000 Units Intradermal Once  . penicillin G in sodium chloride 10,000 units/mL vial for skin test  1,000 Units Intradermal Once  . penicillin G in sodium chloride 10,000 units/mL vial for skin test  1,000 Units Topical Once  . sodium chloride flush  0.1 mL Topical Once  . sodium chloride flush  0.1 mL Intradermal Once  . sodium chloride flush  3 mL Intravenous Q12H   Continuous Infusions: . sodium chloride    . cefTRIAXone (ROCEPHIN)   IV 2 g (04/05/21 1804)   PRN Meds:.sodium chloride, acetaminophen **OR** acetaminophen, albuterol, dextromethorphan-guaiFENesin, diphenhydrAMINE, EPINEPHrine, hydrALAZINE, ondansetron **OR** ondansetron (ZOFRAN) IV, polyethylene glycol, senna-docusate, sodium chloride flush   SUBJECTIVE: Patient is sitting on reclining chair during examination.  He appears comfortable and in no acute distress.  He denies any complaints.  States that his breathing is fine.  States that he is ready to go home.  Review of Systems: Review of Systems  Respiratory: Negative for shortness of breath.     Allergies  Allergen Reactions  . Penicillins Shortness Of Breath    Did it involve swelling of the face/tongue/throat, SOB, or low BP? Y Did it involve sudden or severe rash/hives, skin peeling, or any reaction on the inside of your mouth or nose? Y Did you need to seek medical attention at a hospital or doctor's office? N When did it last happen?Several decades ago. If all above answers are "NO", may proceed with cephalosporin use.     OBJECTIVE: Vitals:   04/06/21 0358 04/06/21 0402 04/06/21 0817 04/06/21 0852  BP: 102/65  (!) 118/37 (!) 125/41  Pulse: 64   (!) 52  Resp: 20  (!) 21 (!) 22  Temp: 99.7 F (37.6 C)   98.1 F (36.7 C)  TempSrc: Oral   Oral  SpO2:   90% 91%  Weight:  115.3  kg    Height:       Body mass index is 37.54 kg/m.  Physical Exam Constitutional:      General: He is not in acute distress.    Appearance: He is not toxic-appearing.  HENT:     Head: Normocephalic.  Cardiovascular:     Rate and Rhythm: Regular rhythm. Bradycardia present.     Heart sounds: No murmur heard.   Pulmonary:     Effort: Pulmonary effort is normal. No respiratory distress.  Abdominal:     General: Bowel sounds are normal. There is distension (Mildly distended).     Tenderness: There is no abdominal tenderness.     Comments: Umbilical hernia noted  Musculoskeletal:        General:  Normal range of motion.  Skin:    General: Skin is warm.     Coloration: Skin is not jaundiced.  Neurological:     Mental Status: He is alert and oriented to person, place, and time. Mental status is at baseline.  Psychiatric:        Mood and Affect: Mood normal.        Thought Content: Thought content normal.        Judgment: Judgment normal.     Lab Results Lab Results  Component Value Date   WBC 12.6 (H) 04/06/2021   HGB 11.5 (L) 04/06/2021   HCT 34.5 (L) 04/06/2021   MCV 88.5 04/06/2021   PLT 168 04/06/2021    Lab Results  Component Value Date   CREATININE 1.09 04/06/2021   BUN 38 (H) 04/06/2021   NA 125 (L) 04/06/2021   K 3.4 (L) 04/06/2021   CL 85 (L) 04/06/2021   CO2 31 04/06/2021    Lab Results  Component Value Date   ALT 18 04/06/2021   AST 16 04/06/2021   ALKPHOS 74 04/06/2021   BILITOT 1.7 (H) 04/06/2021     Microbiology: Recent Results (from the past 240 hour(s))  Resp Panel by RT-PCR (Flu A&B, Covid) Nasopharyngeal Swab     Status: None   Collection Time: 03/30/21 10:39 PM   Specimen: Nasopharyngeal Swab; Nasopharyngeal(NP) swabs in vial transport medium  Result Value Ref Range Status   SARS Coronavirus 2 by RT PCR NEGATIVE NEGATIVE Final    Comment: (NOTE) SARS-CoV-2 target nucleic acids are NOT DETECTED.  The SARS-CoV-2 RNA is generally detectable in upper respiratory specimens during the acute phase of infection. The lowest concentration of SARS-CoV-2 viral copies this assay can detect is 138 copies/mL. A negative result does not preclude SARS-Cov-2 infection and should not be used as the sole basis for treatment or other patient management decisions. A negative result may occur with  improper specimen collection/handling, submission of specimen other than nasopharyngeal swab, presence of viral mutation(s) within the areas targeted by this assay, and inadequate number of viral copies(<138 copies/mL). A negative result must be combined  with clinical observations, patient history, and epidemiological information. The expected result is Negative.  Fact Sheet for Patients:  EntrepreneurPulse.com.au  Fact Sheet for Healthcare Providers:  IncredibleEmployment.be  This test is no t yet approved or cleared by the [place] FDA and  has been authorized for detection and/or diagnosis of SARS-CoV-2 by FDA under an Emergency Use Authorization (EUA). This EUA will remain  in effect (meaning this test can be used) for the duration of the COVID-19 declaration under Section 564(b)(1) of the Act, 21 U.S.C.section 360bbb-3(b)(1), unless the authorization is terminated  or revoked sooner.  Influenza A by PCR NEGATIVE NEGATIVE Final   Influenza B by PCR NEGATIVE NEGATIVE Final    Comment: (NOTE) The Xpert Xpress SARS-CoV-2/FLU/RSV plus assay is intended as an aid in the diagnosis of influenza from Nasopharyngeal swab specimens and should not be used as a sole basis for treatment. Nasal washings and aspirates are unacceptable for Xpert Xpress SARS-CoV-2/FLU/RSV testing.  Fact Sheet for Patients: EntrepreneurPulse.com.au  Fact Sheet for Healthcare Providers: IncredibleEmployment.be  This test is not yet approved or cleared by the [place] FDA and has been authorized for detection and/or diagnosis of SARS-CoV-2 by FDA under an Emergency Use Authorization (EUA). This EUA will remain in effect (meaning this test can be used) for the duration of the COVID-19 declaration under Section 564(b)(1) of the Act, 21 U.S.C. section 360bbb-3(b)(1), unless the authorization is terminated or revoked.  Performed at [facility], [place] [address] [place], [place] [address]   MRSA PCR Screening     Status: None   Collection Time: 03/31/21  3:00 PM   Specimen: Nasal Mucosa; Nasopharyngeal  Result Value Ref Range Status   MRSA by PCR NEGATIVE NEGATIVE  Final    Comment:        The GeneXpert MRSA Assay (FDA approved for NASAL specimens only), is one component of a comprehensive MRSA colonization surveillance program. It is not intended to diagnose MRSA infection nor to guide or monitor treatment for MRSA infections. Performed at [facility], [place] [address] [place], [place] [address]   Culture, blood (routine x 2)     Status: None (Preliminary result)   Collection Time: 04/04/21 12:15 AM   Specimen: BLOOD LEFT ARM  Result Value Ref Range Status   Specimen Description BLOOD LEFT ARM  Final   Special Requests   Final    BOTTLES DRAWN AEROBIC AND ANAEROBIC Blood Culture adequate volume   Culture  Setup Time   Final    GRAM POSITIVE COCCI IN CHAINS IN BOTH AEROBIC AND ANAEROBIC BOTTLES CRITICAL RESULT CALLED TO, READ BACK BY AND VERIFIED WITH: PHARMD ANDREW MEYERS 04/04/2021 AT 2050 A.HUGHES    Culture   Final    GRAM POSITIVE COCCI CULTURE REINCUBATED FOR BETTER GROWTH Performed at [facility], [place] [address], [place], [place] [address]    Report Status PENDING  Incomplete  Culture, blood (routine x 2)     Status: Abnormal (Preliminary result)   Collection Time: 04/04/21 12:17 AM   Specimen: BLOOD RIGHT WRIST  Result Value Ref Range Status   Specimen Description BLOOD RIGHT WRIST  Final   Special Requests   Final    BOTTLES DRAWN AEROBIC AND ANAEROBIC Blood Culture adequate volume   Culture  Setup Time   Final    GRAM POSITIVE COCCI IN CHAINS IN BOTH AEROBIC AND ANAEROBIC BOTTLES CRITICAL RESULT CALLED TO, READ BACK BY AND VERIFIED WITH: PHARMD ANDREW MEYER 04/04/2021 AT 2050 A.HUGHES    Culture (A)  Final    STREPTOCOCCUS ANGINOSIS SUSCEPTIBILITIES TO FOLLOW Performed at [facility], [place] [address], [place], [place] [address]    Report Status PENDING  Incomplete  Blood Culture ID Panel (Reflexed)     Status: Abnormal   Collection Time: 04/04/21 12:17 AM  Result Value Ref Range Status    Enterococcus faecalis NOT DETECTED NOT DETECTED Final   Enterococcus Faecium NOT DETECTED NOT DETECTED Final   Listeria monocytogenes NOT DETECTED NOT DETECTED Final   Staphylococcus species NOT DETECTED NOT DETECTED Final  Staphylococcus aureus (BCID) NOT DETECTED NOT DETECTED Final   Staphylococcus epidermidis NOT DETECTED NOT DETECTED Final   Staphylococcus lugdunensis NOT DETECTED NOT DETECTED Final   Streptococcus species DETECTED (A) NOT DETECTED Final    Comment: Not Enterococcus species, Streptococcus agalactiae, Streptococcus pyogenes, or Streptococcus pneumoniae. CRITICAL RESULT CALLED TO, READ BACK BY AND VERIFIED WITH: PHARMD ANDREW MEYER 04/04/2021 AT 2250 A.HUGHES    Streptococcus agalactiae NOT DETECTED NOT DETECTED Final   Streptococcus pneumoniae NOT DETECTED NOT DETECTED Final   Streptococcus pyogenes NOT DETECTED NOT DETECTED Final   A.calcoaceticus-baumannii NOT DETECTED NOT DETECTED Final   Bacteroides fragilis NOT DETECTED NOT DETECTED Final   Enterobacterales NOT DETECTED NOT DETECTED Final   Enterobacter cloacae complex NOT DETECTED NOT DETECTED Final   Escherichia coli NOT DETECTED NOT DETECTED Final   Klebsiella aerogenes NOT DETECTED NOT DETECTED Final   Klebsiella oxytoca NOT DETECTED NOT DETECTED Final   Klebsiella pneumoniae NOT DETECTED NOT DETECTED Final   Proteus species NOT DETECTED NOT DETECTED Final   Salmonella species NOT DETECTED NOT DETECTED Final   Serratia marcescens NOT DETECTED NOT DETECTED Final   Haemophilus influenzae NOT DETECTED NOT DETECTED Final   Neisseria meningitidis NOT DETECTED NOT DETECTED Final   Pseudomonas aeruginosa NOT DETECTED NOT DETECTED Final   Stenotrophomonas maltophilia NOT DETECTED NOT DETECTED Final   Candida albicans NOT DETECTED NOT DETECTED Final   Candida auris NOT DETECTED NOT DETECTED Final   Candida glabrata NOT DETECTED NOT DETECTED Final   Candida krusei NOT DETECTED NOT DETECTED Final   Candida  parapsilosis NOT DETECTED NOT DETECTED Final   Candida tropicalis NOT DETECTED NOT DETECTED Final   Cryptococcus neoformans/gattii NOT DETECTED NOT DETECTED Final    Comment: Performed at [facility], [address] [place], [place] [address]  Urine Culture     Status: Abnormal   Collection Time: 04/04/21 12:24 PM   Specimen: Urine, Random  Result Value Ref Range Status   Specimen Description URINE, RANDOM  Final   Special Requests   Final    NONE Performed at [facility], [place] [address], [place], [place] [address]    Culture MULTIPLE SPECIES PRESENT, SUGGEST RECOLLECTION (A)  Final   Report Status 04/05/2021 FINAL  Final    Gaylan Gerold, [facility] [phone] pager   [phone] cell 04/06/2021, 11:14 AM

## 2021-04-06 NOTE — Progress Notes (Signed)
    [facility] has been requested to perform a transesophageal echocardiogram on [name] for bacteremia with a pacemaker in place  .  After careful review of history and examination, the risks and benefits of transesophageal echocardiogram have been explained including risks of esophageal damage, perforation (1:10,000 risk), bleeding, pharyngeal hematoma as well as other potential complications associated with conscious sedation including aspiration, arrhythmia, respiratory failure and death. Alternatives to treatment were discussed, questions were answered. Patient initially wanted to think about it but then willing to proceed.  TEE - Dr. Margaretann Loveless 04/07/21  @ 1400 . NPO after midnight. Meds with sips.   Leanor Kail, PA-C 04/06/2021 9:15 AM

## 2021-04-07 ENCOUNTER — Encounter (HOSPITAL_COMMUNITY): Payer: Self-pay | Admitting: Family Medicine

## 2021-04-07 ENCOUNTER — Inpatient Hospital Stay (HOSPITAL_COMMUNITY): Payer: PPO

## 2021-04-07 ENCOUNTER — Inpatient Hospital Stay (HOSPITAL_COMMUNITY): Payer: PPO | Admitting: Certified Registered Nurse Anesthetist

## 2021-04-07 ENCOUNTER — Encounter (HOSPITAL_COMMUNITY)
Admission: EM | Disposition: A | Discharge status: Home Health | Payer: Self-pay | Source: Home / Self Care | Attending: Internal Medicine

## 2021-04-07 DIAGNOSIS — R7881 Bacteremia: Secondary | ICD-10-CM

## 2021-04-07 DIAGNOSIS — Q211 Atrial septal defect: Secondary | ICD-10-CM

## 2021-04-07 DIAGNOSIS — I361 Nonrheumatic tricuspid (valve) insufficiency: Secondary | ICD-10-CM

## 2021-04-07 HISTORY — PX: BUBBLE STUDY: SHX6837

## 2021-04-07 HISTORY — PX: TEE WITHOUT CARDIOVERSION: SHX5443

## 2021-04-07 LAB — CBC
HCT: 36.3 % — ABNORMAL LOW (ref 39.0–52.0)
Hemoglobin: 12.2 g/dL — ABNORMAL LOW (ref 13.0–17.0)
MCH: 29.8 pg (ref 26.0–34.0)
MCHC: 33.6 g/dL (ref 30.0–36.0)
MCV: 88.5 fL (ref 80.0–100.0)
Platelets: 227 10*3/uL (ref 150–400)
RBC: 4.1 MIL/uL — ABNORMAL LOW (ref 4.22–5.81)
RDW: 14.3 % (ref 11.5–15.5)
WBC: 11.1 10*3/uL — ABNORMAL HIGH (ref 4.0–10.5)
nRBC: 0 % (ref 0.0–0.2)

## 2021-04-07 LAB — CULTURE, BLOOD (ROUTINE X 2): Special Requests: ADEQUATE

## 2021-04-07 LAB — GLUCOSE, CAPILLARY
Glucose-Capillary: 115 mg/dL — ABNORMAL HIGH (ref 70–99)
Glucose-Capillary: 106 mg/dL — ABNORMAL HIGH (ref 70–99)
Glucose-Capillary: 114 mg/dL — ABNORMAL HIGH (ref 70–99)
Glucose-Capillary: 161 mg/dL — ABNORMAL HIGH (ref 70–99)

## 2021-04-07 LAB — BASIC METABOLIC PANEL
Anion gap: 11 (ref 5–15)
BUN: 24 mg/dL — ABNORMAL HIGH (ref 8–23)
CO2: 33 mmol/L — ABNORMAL HIGH (ref 22–32)
Calcium: 8.6 mg/dL — ABNORMAL LOW (ref 8.9–10.3)
Chloride: 87 mmol/L — ABNORMAL LOW (ref 98–111)
Creatinine, Ser: 0.81 mg/dL (ref 0.61–1.24)
GFR, Estimated: >60 mL/min (ref >60)
Glucose, Bld: 113 mg/dL — ABNORMAL HIGH (ref 70–99)
Potassium: 3.6 mmol/L (ref 3.5–5.1)
Sodium: 131 mmol/L — ABNORMAL LOW (ref 135–145)

## 2021-04-07 LAB — MAGNESIUM: Magnesium: 2.3 mg/dL (ref 1.7–2.4)

## 2021-04-07 SURGERY — ECHOCARDIOGRAM, TRANSESOPHAGEAL
Anesthesia: Monitor Anesthesia Care

## 2021-04-07 MED ORDER — PROPOFOL 10 MG/ML IV BOLUS
INTRAVENOUS | Status: DC | PRN
  Administered 2021-04-07: 40 mg via INTRAVENOUS
  Administered 2021-04-07: 20 mg via INTRAVENOUS

## 2021-04-07 MED ORDER — LACTATED RINGERS IV SOLN: INTRAVENOUS | Status: DC

## 2021-04-07 MED ORDER — PROPOFOL 500 MG/50ML IV EMUL
INTRAVENOUS | Status: DC | PRN
  Administered 2021-04-07: 100 ug/kg/min via INTRAVENOUS

## 2021-04-07 MED ORDER — PHENYLEPHRINE 40 MCG/ML (10ML) SYRINGE FOR IV PUSH (FOR BLOOD PRESSURE SUPPORT)
PREFILLED_SYRINGE | INTRAVENOUS | Status: DC | PRN
  Administered 2021-04-07: 80 ug via INTRAVENOUS

## 2021-04-07 MED ORDER — LIDOCAINE HCL (PF) 2 % IJ SOLN
INTRAMUSCULAR | Status: DC | PRN
  Administered 2021-04-07: 100 mg via INTRADERMAL

## 2021-04-07 MED ORDER — POTASSIUM CHLORIDE 20 MEQ PO PACK
40.0000 meq | PACK | Freq: Once | ORAL | Status: AC
  Administered 2021-04-07: 40 meq via ORAL
  Filled 2021-04-07: qty 2

## 2021-04-07 NOTE — CV Procedure (Signed)
INDICATIONS: Bacteremia, pacemaker  PROCEDURE:   Informed consent was obtained prior to the procedure. The risks, benefits and alternatives for the procedure were discussed and the patient comprehended these risks.  Risks include, but are not limited to, cough, sore throat, vomiting, nausea, somnolence, esophageal and stomach trauma or perforation, bleeding, low blood pressure, aspiration, pneumonia, infection, trauma to the teeth and death.    After a procedural time-out, the oropharynx was anesthetized with 20% benzocaine spray.   During this procedure the patient was administered propofol per anesthesia.  The patient's heart rate, blood pressure, and oxygen saturation were monitored continuously during the procedure. The period of conscious sedation was 29 minutes, of which I was present face-to-face 100% of this time.  The transesophageal probe was inserted in the esophagus and stomach without difficulty and multiple views were obtained.  The patient was kept under observation until the patient left the procedure room.  The patient left the procedure room in stable condition.   Agitated microbubble saline contrast was administered.  COMPLICATIONS:    There were no immediate complications.  FINDINGS:   FORMAL ECHOCARDIOGRAM REPORT PENDING  No evidence of valvular vegetations. No evidence of pacemaker lead vegetation.   Positive agitated saline exam - there is a small PFO with right to left shunt.   RECOMMENDATIONS/CONCLUSIONS:    No evidence of endocarditis.  Time Spent Directly with the Patient:  60 minutes   Elouise Munroe 04/07/2021, 3:14 PM

## 2021-04-07 NOTE — Progress Notes (Signed)
PROGRESS NOTE    Nathan Miller  EAV:[id number] DOB: June 21, 1951 DOA: 03/30/2021 PCP: [facility]   Brief Narrative:  70 y.o. male with medical history significant for CAD, heart block s/p implanted pacemaker, DM, CHF who presents by EMS for evaluation of SOB with chest pain. He reports he laid down for a nap around 1 pm on 03/30/21 and when he woke up he had SOB, chills and chest pain. Claimed to have fevers prior to visit. Pt initially required 3LNC in ED, improved after one dose of IV lasix.  During hospital stay blood cultures became positive for strep bacteremia therefore started on IV Rocephin, ID consulted.  Echocardiogram did not show any vegetation therefore TEE was planned.   Assessment & Plan:   Principal Problem:   Acute respiratory failure with hypoxia (HCC) Active Problems:   Essential hypertension   CAD S/P percutaneous coronary angioplasty; bms X 2 to RCA 2001   Complete heart block (HCC)   Diabetes type 2, controlled (HCC)   Cardiac pacemaker in situ   Obesity with body mass index (BMI) of 30.0 to 39.9   Elevated d-dimer   Elevated brain natriuretic peptide (BNP) level  Strep bacteremia with sepsis not present on admit -Sepsis not present on admission.  Cultures growing Streptococcus.  WBC improving - Echo negative for vegetation. - Antibiotics-Rocephin changed to IV ampicillin - Repeat cultures done 6/8 - TEE today - ID following.   Acute hypoxic respiratory failure secondary to CHF, improved Acute congestive heart failure with preserved ejection fraction, EF 55%, grade 2 DD, improved -Improved with IV diuretics. -CTA chest negative for PE - Echocardiogram- EF 55 to 60%, grade 2 DD - Hold off on further IV Lasix.  Monitor clinically and labs     Essential hypertension -Due to soft blood pressure Coreg, lisinopril and Lasix on hold   Diabetes mellitus type 2 -A1c 6.4.  Home metformin on hold - Insulin sliding scale and  Accu-Chek   Elevated D-dimer -CTA chest negative for PE     Complete heart block  History of CAD status post PCI 2001 -Pacemaker in place.  Chest pain-free - Lipitor 80 mg daily   ARF, improved -likely secondary to lasix and lisinopril, now on hold.  Creatinine 0.8 -Cr improved today -Repeat BMET in aM     Does have a full-thickness wound on the plantar surface of his foot without any obvious evidence of active infection.  DVT prophylaxis: SQ Lovenox  Code Status: Full Family Communication: Patient does not want me to update anyone  Status is: Inpatient  Remains inpatient appropriate because:Inpatient level of care appropriate due to severity of illness   Dispo: The patient is from: Home              Anticipated d/c is to: Home              Patient currently is not medically stable to d/c.  Ongoing evaluation for possible endocarditis in setting of gram-positive bacteremia.  TEE planned today.  Hospital course to be determined thereafter   Difficult to place patient No      Subjective: Sitting up in the chair no complaints.  Wanting to go home but he understands he needs to get this procedure done.  Review of Systems Otherwise negative except as per HPI, including: General: Denies fever, chills, night sweats or unintended weight loss. Resp: Denies cough, wheezing, shortness of breath. Cardiac: Denies chest pain, palpitations, orthopnea, paroxysmal nocturnal dyspnea. GI: Denies  abdominal pain, nausea, vomiting, diarrhea or constipation GU: Denies dysuria, frequency, hesitancy or incontinence MS: Denies muscle aches, joint pain or swelling Neuro: Denies headache, neurologic deficits (focal weakness, numbness, tingling), abnormal gait Psych: Denies anxiety, depression, SI/HI/AVH Skin: Denies new rashes or lesions ID: Denies sick contacts, exotic exposures, travel  Examination: Constitutional: Not in acute distress Respiratory: Clear to auscultation  bilaterally Cardiovascular: Normal sinus rhythm, no rubs Abdomen: Nontender nondistended good bowel sounds Musculoskeletal: No edema noted Skin: No rashes seen Neurologic: CN 2-12 grossly intact.  And nonfocal Psychiatric: Normal judgment and insight. Alert and oriented x 3. Normal mood.  Full-thickness wound on plantar surface of his foot without any odor drainage or fluctuance.  Generalized edema, erythema and structural deformity of his left foot noted.   Objective: Vitals:   04/06/21 1707 04/07/21 0417 04/07/21 0446 04/07/21 0755  BP: (!) 131/49 (!) 137/53  (!) 148/44  Pulse: (!) 50 (!) 51  (!) 51  Resp: 20 (!) 21  (!) 25  Temp: 98.2 F (36.8 C) 98.3 F (36.8 C)  98.3 F (36.8 C)  TempSrc: Oral Oral  Oral  SpO2: 98% 98%  98%  Weight:   118.8 kg   Height:        Intake/Output Summary (Last 24 hours) at 04/07/2021 0800 Last data filed at 04/06/2021 1800 Gross per 24 hour  Intake 100 ml  Output --  Net 100 ml   Filed Weights   04/04/21 0500 04/06/21 0402 04/07/21 0446  Weight: 114.9 kg 115.3 kg 118.8 kg     Data Reviewed:   CBC: Recent Labs  Lab 04/02/21 0224 04/04/21 0002 04/05/21 0350 04/06/21 0401 04/07/21 0433  WBC 7.4 11.9* 16.2* 12.6* 11.1*  HGB 14.0 14.7 12.3* 11.5* 12.2*  HCT 42.9 43.7 37.6* 34.5* 36.3*  MCV 89.7 89.7 90.4 88.5 88.5  PLT 204 184 192 168 227   Basic Metabolic Panel: Recent Labs  Lab 04/01/21 0221 04/02/21 0224 04/03/21 0226 04/04/21 0002 04/05/21 0350 04/06/21 0401 04/07/21 0433  NA 132*   < > 134* 131* 126* 125* 131*  K 3.4*   < > 3.1* 4.0 4.1 3.4* 3.6  CL 91*   < > 87* 88* 86* 85* 87*  CO2 31   < > 38* 32 30 31 33*  GLUCOSE 109*   < > 109* 144* 123* 125* 113*  BUN 10   < > 16 22 33* 38* 24*  CREATININE 0.80   < > 0.90 1.23 1.19 1.09 0.81  CALCIUM 8.8*   < > 8.8* 9.1 8.6* 8.5* 8.6*  MG 1.7  --  1.9  --  1.9 2.1 2.3   < > = values in this interval not displayed.   GFR: Estimated Creatinine Clearance: 112.5 mL/min (by  C-G formula based on SCr of 0.81 mg/dL). Liver Function Tests: Recent Labs  Lab 04/02/21 0224 04/03/21 0226 04/04/21 0002 04/05/21 0350 04/06/21 0401  AST 24 18 21 16 16   ALT 16 17 20 17 18   ALKPHOS 96 90 101 88 74  BILITOT 1.5* 1.5* 2.8* 2.8* 1.7*  PROT 6.6 6.2* 7.0 6.2* 6.0*  ALBUMIN 3.2* 3.1* 3.4* 2.8* 2.5*   No results for input(s): LIPASE, AMYLASE in the last 168 hours. No results for input(s): AMMONIA in the last 168 hours. Coagulation Profile: No results for input(s): INR, PROTIME in the last 168 hours. Cardiac Enzymes: No results for input(s): CKTOTAL, CKMB, CKMBINDEX, TROPONINI in the last 168 hours. BNP (last 3 results) No results for input(s):  PROBNP in the last 8760 hours. HbA1C: No results for input(s): HGBA1C in the last 72 hours. CBG: Recent Labs  Lab 04/06/21 0813 04/06/21 1305 04/06/21 1643 04/06/21 2012 04/07/21 0754  GLUCAP 189* 219* 112* 127* 115*   Lipid Profile: No results for input(s): CHOL, HDL, LDLCALC, TRIG, CHOLHDL, LDLDIRECT in the last 72 hours. Thyroid Function Tests: No results for input(s): TSH, T4TOTAL, FREET4, T3FREE, THYROIDAB in the last 72 hours. Anemia Panel: No results for input(s): VITAMINB12, FOLATE, FERRITIN, TIBC, IRON, RETICCTPCT in the last 72 hours. Sepsis Labs: No results for input(s): PROCALCITON, LATICACIDVEN in the last 168 hours.  Recent Results (from the past 240 hour(s))  Resp Panel by RT-PCR (Flu A&B, Covid) Nasopharyngeal Swab     Status: None   Collection Time: 03/30/21 10:39 PM   Specimen: Nasopharyngeal Swab; Nasopharyngeal(NP) swabs in vial transport medium  Result Value Ref Range Status   SARS Coronavirus 2 by RT PCR NEGATIVE NEGATIVE Final    Comment: (NOTE) SARS-CoV-2 target nucleic acids are NOT DETECTED.  The SARS-CoV-2 RNA is generally detectable in upper respiratory specimens during the acute phase of infection. The lowest concentration of SARS-CoV-2 viral copies this assay can detect is 138  copies/mL. A negative result does not preclude SARS-Cov-2 infection and should not be used as the sole basis for treatment or other patient management decisions. A negative result may occur with  improper specimen collection/handling, submission of specimen other than nasopharyngeal swab, presence of viral mutation(s) within the areas targeted by this assay, and inadequate number of viral copies(<138 copies/mL). A negative result must be combined with clinical observations, patient history, and epidemiological information. The expected result is Negative.  Fact Sheet for Patients:  BloggerCourse.com  Fact Sheet for Healthcare Providers:  SeriousBroker.it  This test is no t yet approved or cleared by the [place] FDA and  has been authorized for detection and/or diagnosis of SARS-CoV-2 by FDA under an Emergency Use Authorization (EUA). This EUA will remain  in effect (meaning this test can be used) for the duration of the COVID-19 declaration under Section 564(b)(1) of the Act, 21 U.S.C.section 360bbb-3(b)(1), unless the authorization is terminated  or revoked sooner.       Influenza A by PCR NEGATIVE NEGATIVE Final   Influenza B by PCR NEGATIVE NEGATIVE Final    Comment: (NOTE) The Xpert Xpress SARS-CoV-2/FLU/RSV plus assay is intended as an aid in the diagnosis of influenza from Nasopharyngeal swab specimens and should not be used as a sole basis for treatment. Nasal washings and aspirates are unacceptable for Xpert Xpress SARS-CoV-2/FLU/RSV testing.  Fact Sheet for Patients: BloggerCourse.com  Fact Sheet for Healthcare Providers: SeriousBroker.it  This test is not yet approved or cleared by the [place] FDA and has been authorized for detection and/or diagnosis of SARS-CoV-2 by FDA under an Emergency Use Authorization (EUA). This EUA will remain in effect (meaning  this test can be used) for the duration of the COVID-19 declaration under Section 564(b)(1) of the Act, 21 U.S.C. section 360bbb-3(b)(1), unless the authorization is terminated or revoked.  Performed at [facility], [address] [place], [place] [address]   MRSA PCR Screening     Status: None   Collection Time: 03/31/21  3:00 PM   Specimen: Nasal Mucosa; Nasopharyngeal  Result Value Ref Range Status   MRSA by PCR NEGATIVE NEGATIVE Final    Comment:        The GeneXpert MRSA Assay (FDA approved for NASAL specimens only), is one  component of a comprehensive MRSA colonization surveillance program. It is not intended to diagnose MRSA infection nor to guide or monitor treatment for MRSA infections. Performed at [facility], [address], [place], [place] [address]   Culture, blood (routine x 2)     Status: Abnormal (Preliminary result)   Collection Time: 04/04/21 12:15 AM   Specimen: BLOOD LEFT ARM  Result Value Ref Range Status   Specimen Description BLOOD LEFT ARM  Final   Special Requests   Final    BOTTLES DRAWN AEROBIC AND ANAEROBIC Blood Culture adequate volume   Culture  Setup Time   Final    GRAM POSITIVE COCCI IN CHAINS IN BOTH AEROBIC AND ANAEROBIC BOTTLES CRITICAL RESULT CALLED TO, READ BACK BY AND VERIFIED WITH: PHARMD ANDREW MEYERS 04/04/2021 AT 2050 A.HUGHES    Culture (A)  Final    STREPTOCOCCUS ANGINOSIS SUSCEPTIBILITIES PERFORMED ON PREVIOUS CULTURE WITHIN THE LAST 5 DAYS. ENTEROCOCCUS FAECALIS SUSCEPTIBILITIES TO FOLLOW Performed at [facility], [address] [place], [place] [address]    Report Status PENDING  Incomplete  Culture, blood (routine x 2)     Status: Abnormal   Collection Time: 04/04/21 12:17 AM   Specimen: BLOOD RIGHT WRIST  Result Value Ref Range Status   Specimen Description BLOOD RIGHT WRIST  Final   Special Requests   Final    BOTTLES DRAWN AEROBIC AND ANAEROBIC Blood Culture adequate volume   Culture   Setup Time   Final    GRAM POSITIVE COCCI IN CHAINS IN BOTH AEROBIC AND ANAEROBIC BOTTLES CRITICAL RESULT CALLED TO, READ BACK BY AND VERIFIED WITH: PHARMD ANDREW MEYER 04/04/2021 AT 2050 A.HUGHES Performed at [facility], [address], [place], [place] [address]    Culture STREPTOCOCCUS ANGINOSIS (A)  Final   Report Status 04/06/2021 FINAL  Final   Organism ID, Bacteria STREPTOCOCCUS ANGINOSIS  Final      Susceptibility   Streptococcus anginosis - MIC*    PENICILLIN 0.12 SENSITIVE Sensitive     CEFTRIAXONE 0.5 SENSITIVE Sensitive     ERYTHROMYCIN <=0.12 SENSITIVE Sensitive     LEVOFLOXACIN 0.5 SENSITIVE Sensitive     VANCOMYCIN 0.5 SENSITIVE Sensitive     * STREPTOCOCCUS ANGINOSIS  Blood Culture ID Panel (Reflexed)     Status: Abnormal   Collection Time: 04/04/21 12:17 AM  Result Value Ref Range Status   Enterococcus faecalis NOT DETECTED NOT DETECTED Final   Enterococcus Faecium NOT DETECTED NOT DETECTED Final   Listeria monocytogenes NOT DETECTED NOT DETECTED Final   Staphylococcus species NOT DETECTED NOT DETECTED Final   Staphylococcus aureus (BCID) NOT DETECTED NOT DETECTED Final   Staphylococcus epidermidis NOT DETECTED NOT DETECTED Final   Staphylococcus lugdunensis NOT DETECTED NOT DETECTED Final   Streptococcus species DETECTED (A) NOT DETECTED Final    Comment: Not Enterococcus species, Streptococcus agalactiae, Streptococcus pyogenes, or Streptococcus pneumoniae. CRITICAL RESULT CALLED TO, READ BACK BY AND VERIFIED WITH: PHARMD ANDREW MEYER 04/04/2021 AT 2250 A.HUGHES    Streptococcus agalactiae NOT DETECTED NOT DETECTED Final   Streptococcus pneumoniae NOT DETECTED NOT DETECTED Final   Streptococcus pyogenes NOT DETECTED NOT DETECTED Final   A.calcoaceticus-baumannii NOT DETECTED NOT DETECTED Final   Bacteroides fragilis NOT DETECTED NOT DETECTED Final   Enterobacterales NOT DETECTED NOT DETECTED Final   Enterobacter cloacae complex NOT DETECTED NOT  DETECTED Final   Escherichia coli NOT DETECTED NOT DETECTED Final   Klebsiella aerogenes NOT DETECTED NOT DETECTED Final   Klebsiella oxytoca  NOT DETECTED NOT DETECTED Final   Klebsiella pneumoniae NOT DETECTED NOT DETECTED Final   Proteus species NOT DETECTED NOT DETECTED Final   Salmonella species NOT DETECTED NOT DETECTED Final   Serratia marcescens NOT DETECTED NOT DETECTED Final   Haemophilus influenzae NOT DETECTED NOT DETECTED Final   Neisseria meningitidis NOT DETECTED NOT DETECTED Final   Pseudomonas aeruginosa NOT DETECTED NOT DETECTED Final   Stenotrophomonas maltophilia NOT DETECTED NOT DETECTED Final   Candida albicans NOT DETECTED NOT DETECTED Final   Candida auris NOT DETECTED NOT DETECTED Final   Candida glabrata NOT DETECTED NOT DETECTED Final   Candida krusei NOT DETECTED NOT DETECTED Final   Candida parapsilosis NOT DETECTED NOT DETECTED Final   Candida tropicalis NOT DETECTED NOT DETECTED Final   Cryptococcus neoformans/gattii NOT DETECTED NOT DETECTED Final    Comment: Performed at [facility], [address] [place], [place] [address]  Urine Culture     Status: Abnormal   Collection Time: 04/04/21 12:24 PM   Specimen: Urine, Random  Result Value Ref Range Status   Specimen Description URINE, RANDOM  Final   Special Requests   Final    NONE Performed at [facility], [address] [place], [place] [address]    Culture MULTIPLE SPECIES PRESENT, SUGGEST RECOLLECTION (A)  Final   Report Status 04/05/2021 FINAL  Final         Radiology Studies: DG Foot Complete Right  Result Date: 04/05/2021 CLINICAL DATA:  Right heel diabetic wound for several months. EXAM: RIGHT FOOT COMPLETE - 3+ VIEW COMPARISON:  01/31/2017. FINDINGS: Previously demonstrated Charcot changes and changes of previous osteomyelitis with areas of bone fusion and severe irregularity and deformity. No areas of acute bone destruction or periosteal reaction seen. Diffuse soft tissue  swelling without soft tissue gas. 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 Electronically Signed   By: Beckie Salts M.D.   On: 04/05/2021 20:35         Scheduled Meds:  aspirin EC  81 mg Oral Daily   atorvastatin  80 mg Oral Daily   enoxaparin (LOVENOX) injection  0.5 mg/kg Subcutaneous Q24H   ibuprofen  600 mg Oral Once   insulin aspart  0-5 Units Subcutaneous QHS   insulin aspart  0-9 Units Subcutaneous TID WC   multivitamin with minerals  1 tablet Oral Daily   sodium chloride flush  3 mL Intravenous Q12H   Continuous Infusions:  sodium chloride     ampicillin (OMNIPEN) IV 2 g (04/07/21 0446)     LOS: 7 days   Time spent= 35 mins    Digby Groeneveld Joline Maxcy, MD [facility]  If 7PM-7AM, please contact night-coverage  04/07/2021, 8:00 AM

## 2021-04-07 NOTE — Progress Notes (Signed)
Patient taken for EGD via transport.

## 2021-04-07 NOTE — Progress Notes (Signed)
Returned from procedure. Awake and alert. Inchair watching TV. Monitors in place.

## 2021-04-07 NOTE — Interval H&P Note (Signed)
History and Physical Interval Note:  04/07/2021 12:50 PM  Nathan Miller  has presented today for surgery, with the diagnosis of BACTEREMIA.  The various methods of treatment have been discussed with the patient and family. After consideration of risks, benefits and other options for treatment, the patient has consented to  Procedure(s): TRANSESOPHAGEAL ECHOCARDIOGRAM (TEE) (N/A) as a surgical intervention.  The patient's history has been reviewed, patient examined, no change in status, stable for surgery.  I have reviewed the patient's chart and labs.  Questions were answered to the patient's satisfaction.     Elouise Munroe

## 2021-04-07 NOTE — Transfer of Care (Signed)
Immediate Anesthesia Transfer of Care Note  Patient: Nathan Miller  Procedure(s) Performed: TRANSESOPHAGEAL ECHOCARDIOGRAM (TEE) BUBBLE STUDY  Patient Location: Endoscopy Unit  Anesthesia Type:MAC  Level of Consciousness: awake and patient cooperative  Airway & Oxygen Therapy: Patient Spontanous Breathing and Patient connected to nasal cannula oxygen  Post-op Assessment: Report given to RN and Post -op Vital signs reviewed and stable  Post vital signs: Reviewed  Last Vitals:  Vitals Value Taken Time  BP    Temp    Pulse    Resp    SpO2      Last Pain:  Vitals:   04/07/21 1238  TempSrc: Oral  PainSc: 0-No pain      Patients Stated Pain Goal: 0 (14/38/88 7579)  Complications: No notable events documented.

## 2021-04-07 NOTE — Consult Note (Addendum)
WOC consult was previously performed on 6/6; refer to previous progress notes.  Pt has developed new areas of full thickness wounds, located next to the previously noted full thickness wound on plantar foot.  There are 2 areas in close proximity; .2X.2X.1cm and 1X3X.1cm, red and moist.  No odor, drainage or fluctuance.  Left foot remains with generalized edema and erythremia and structural deformities.  Topical treatment orders provided as follows:  Wife can change dressing to right foot wound every other day and has supplies from home.  Or bedside nurse can change every other day as follows:  Apply Aquacel Kellie Simmering # [phone]) over wounds, then cover with 4X4 and kerlex. Please re-consult if further assistance is needed.  Thank-you,  Julien Girt MSN, [place], [place], [place], [place]

## 2021-04-07 NOTE — Anesthesia Postprocedure Evaluation (Signed)
Anesthesia Post Note  Patient: Nathan Miller  Procedure(s) Performed: TRANSESOPHAGEAL ECHOCARDIOGRAM (TEE) BUBBLE STUDY     Patient location during evaluation: Endoscopy Anesthesia Type: MAC Level of consciousness: awake and alert Pain management: pain level controlled Vital Signs Assessment: post-procedure vital signs reviewed and stable Respiratory status: spontaneous breathing, nonlabored ventilation, respiratory function stable and patient connected to nasal cannula oxygen Cardiovascular status: stable and blood pressure returned to baseline Postop Assessment: no apparent nausea or vomiting Anesthetic complications: no   No notable events documented.  Last Vitals:  Vitals:   04/07/21 1518 04/07/21 1536  BP: (!) 178/57   Pulse: (!) 50 (!) 51  Resp: (!) 25 (!) 24  Temp:  36.7 C  SpO2: 96% 95%    Last Pain:  Vitals:   04/07/21 1536  TempSrc: Oral  PainSc:                  Ireene Ballowe COKER

## 2021-04-07 NOTE — Anesthesia Preprocedure Evaluation (Signed)
Anesthesia Evaluation  Patient identified by MRN, date of birth, ID band Patient awake    Reviewed: Allergy & Precautions, NPO status , Patient's Chart, lab work & pertinent test results  Airway Mallampati: III  TM Distance: >3 FB     Dental  (+) Edentulous Upper, Edentulous Lower   Pulmonary former smoker,    breath sounds clear to auscultation       Cardiovascular hypertension,  Rhythm:Regular Rate:Normal     Neuro/Psych    GI/Hepatic   Endo/Other  diabetes  Renal/GU      Musculoskeletal   Abdominal   Peds  Hematology   Anesthesia Other Findings   Reproductive/Obstetrics                             Anesthesia Physical Anesthesia Plan  ASA: 3  Anesthesia Plan: MAC   Post-op Pain Management:    Induction: Intravenous  PONV Risk Score and Plan: Ondansetron  Airway Management Planned: Natural Airway and Nasal Cannula  Additional Equipment:   Intra-op Plan:   Post-operative Plan:   Informed Consent: I have reviewed the patients History and Physical, chart, labs and discussed the procedure including the risks, benefits and alternatives for the proposed anesthesia with the patient or authorized representative who has indicated his/her understanding and acceptance.       Plan Discussed with: CRNA and Anesthesiologist  Anesthesia Plan Comments:         Anesthesia Quick Evaluation

## 2021-04-07 NOTE — Progress Notes (Signed)
[facility]  Date of Admission:  03/30/2021   Total days of antibiotics 4         ASSESSMENT: Patient appears well.  Afebrile overnight.  Blood culture now grew additional Enterococcus faecalis, pending susceptibility.  Patient has past the penicillin allergy testing so we will continue IV ampicillin for Streptococcus bacteremia.  Patient undergoes TEE today and the result will determine the duration of antibiotic therapy.  If he has evidence of endocarditis, will add ceftriaxone.  PLAN: Continue IV ampicillin Follow-up on TEE result today  Principal Problem:   Acute respiratory failure with hypoxia (HCC) Active Problems:   Essential hypertension   CAD S/P percutaneous coronary angioplasty; bms X 2 to RCA 2001   Complete heart block (HCC)   Diabetes type 2, controlled (HCC)   Cardiac pacemaker in situ   Obesity with body mass index (BMI) of 30.0 to 39.9   Elevated d-dimer   Elevated brain natriuretic peptide (BNP) level   Scheduled Meds:  aspirin EC  81 mg Oral Daily   atorvastatin  80 mg Oral Daily   enoxaparin (LOVENOX) injection  0.5 mg/kg Subcutaneous Q24H   ibuprofen  600 mg Oral Once   insulin aspart  0-5 Units Subcutaneous QHS   insulin aspart  0-9 Units Subcutaneous TID WC   multivitamin with minerals  1 tablet Oral Daily   potassium chloride  40 mEq Oral Once   sodium chloride flush  3 mL Intravenous Q12H   Continuous Infusions:  sodium chloride     ampicillin (OMNIPEN) IV 2 g (04/07/21 0805)   PRN Meds:.sodium chloride, acetaminophen **OR** acetaminophen, albuterol, dextromethorphan-guaiFENesin, diphenhydrAMINE, EPINEPHrine, hydrALAZINE, ondansetron **OR** ondansetron (ZOFRAN) IV, polyethylene glycol, senna-docusate, sodium chloride flush   SUBJECTIVE Patient sitting on recliner chair during examination.  He appears comfortable in no acute respiratory distress.  He states that he is feeling well with no respiratory  complaints.  Denies any pain or new changes since yesterday.  Review of Systems: Per HPI ROS  No Known Allergies  OBJECTIVE: Vitals:   04/06/21 1707 04/07/21 0417 04/07/21 0446 04/07/21 0755  BP: (!) 131/49 (!) 137/53  (!) 148/44  Pulse: (!) 50 (!) 51  (!) 51  Resp: 20 (!) 21  (!) 25  Temp: 98.2 F (36.8 C) 98.3 F (36.8 C)  98.3 F (36.8 C)  TempSrc: Oral Oral  Oral  SpO2: 98% 98%  98%  Weight:   118.8 kg   Height:       Body mass index is 38.68 kg/m.  Physical Exam Constitutional:      General: He is not in acute distress.    Appearance: He is not ill-appearing.  HENT:     Head: Normocephalic.  Eyes:     General: No scleral icterus.       Right eye: No discharge.        Left eye: No discharge.     Conjunctiva/sclera: Conjunctivae normal.  Cardiovascular:     Rate and Rhythm: Regular rhythm. Bradycardia present.     Heart sounds: Normal heart sounds. No murmur heard. Pulmonary:     Effort: Pulmonary effort is normal. No respiratory distress.  Abdominal:     General: There is distension.     Tenderness: There is no abdominal tenderness.  Skin:    General: Skin is warm.     Coloration: Skin is not jaundiced.  Neurological:     Mental Status: He is alert. Mental  status is at baseline.  Psychiatric:        Mood and Affect: Mood normal.        Thought Content: Thought content normal.        Judgment: Judgment normal.    Lab Results Lab Results  Component Value Date   WBC 11.1 (H) 04/07/2021   HGB 12.2 (L) 04/07/2021   HCT 36.3 (L) 04/07/2021   MCV 88.5 04/07/2021   PLT 227 04/07/2021    Lab Results  Component Value Date   CREATININE 0.81 04/07/2021   BUN 24 (H) 04/07/2021   NA 131 (L) 04/07/2021   K 3.6 04/07/2021   CL 87 (L) 04/07/2021   CO2 33 (H) 04/07/2021    Lab Results  Component Value Date   ALT 18 04/06/2021   AST 16 04/06/2021   ALKPHOS 74 04/06/2021   BILITOT 1.7 (H) 04/06/2021     Microbiology: Recent Results (from the past 240  hour(s))  Resp Panel by RT-PCR (Flu A&B, Covid) Nasopharyngeal Swab     Status: None   Collection Time: 03/30/21 10:39 PM   Specimen: Nasopharyngeal Swab; Nasopharyngeal(NP) swabs in vial transport medium  Result Value Ref Range Status   SARS Coronavirus 2 by RT PCR NEGATIVE NEGATIVE Final    Comment: (NOTE) SARS-CoV-2 target nucleic acids are NOT DETECTED.  The SARS-CoV-2 RNA is generally detectable in upper respiratory specimens during the acute phase of infection. The lowest concentration of SARS-CoV-2 viral copies this assay can detect is 138 copies/mL. A negative result does not preclude SARS-Cov-2 infection and should not be used as the sole basis for treatment or other patient management decisions. A negative result may occur with  improper specimen collection/handling, submission of specimen other than nasopharyngeal swab, presence of viral mutation(s) within the areas targeted by this assay, and inadequate number of viral copies(<138 copies/mL). A negative result must be combined with clinical observations, patient history, and epidemiological information. The expected result is Negative.  Fact Sheet for Patients:  EntrepreneurPulse.com.au  Fact Sheet for Healthcare Providers:  IncredibleEmployment.be  This test is no t yet approved or cleared by the [place] FDA and  has been authorized for detection and/or diagnosis of SARS-CoV-2 by FDA under an Emergency Use Authorization (EUA). This EUA will remain  in effect (meaning this test can be used) for the duration of the COVID-19 declaration under Section 564(b)(1) of the Act, 21 U.S.C.section 360bbb-3(b)(1), unless the authorization is terminated  or revoked sooner.       Influenza A by PCR NEGATIVE NEGATIVE Final   Influenza B by PCR NEGATIVE NEGATIVE Final    Comment: (NOTE) The Xpert Xpress SARS-CoV-2/FLU/RSV plus assay is intended as an aid in the diagnosis of influenza from  Nasopharyngeal swab specimens and should not be used as a sole basis for treatment. Nasal washings and aspirates are unacceptable for Xpert Xpress SARS-CoV-2/FLU/RSV testing.  Fact Sheet for Patients: EntrepreneurPulse.com.au  Fact Sheet for Healthcare Providers: IncredibleEmployment.be  This test is not yet approved or cleared by the [place] FDA and has been authorized for detection and/or diagnosis of SARS-CoV-2 by FDA under an Emergency Use Authorization (EUA). This EUA will remain in effect (meaning this test can be used) for the duration of the COVID-19 declaration under Section 564(b)(1) of the Act, 21 U.S.C. section 360bbb-3(b)(1), unless the authorization is terminated or revoked.  Performed at [facility], [place] [address], [place], [place] [address]   MRSA PCR Screening     Status: None  Collection Time: 03/31/21  3:00 PM   Specimen: Nasal Mucosa; Nasopharyngeal  Result Value Ref Range Status   MRSA by PCR NEGATIVE NEGATIVE Final    Comment:        The GeneXpert MRSA Assay (FDA approved for NASAL specimens only), is one component of a comprehensive MRSA colonization surveillance program. It is not intended to diagnose MRSA infection nor to guide or monitor treatment for MRSA infections. Performed at [facility], [place] [address] [place], [place] [address]   Culture, blood (routine x 2)     Status: Abnormal   Collection Time: 04/04/21 12:15 AM   Specimen: BLOOD LEFT ARM  Result Value Ref Range Status   Specimen Description BLOOD LEFT ARM  Final   Special Requests   Final    BOTTLES DRAWN AEROBIC AND ANAEROBIC Blood Culture adequate volume   Culture  Setup Time   Final    GRAM POSITIVE COCCI IN CHAINS IN BOTH AEROBIC AND ANAEROBIC BOTTLES CRITICAL RESULT CALLED TO, READ BACK BY AND VERIFIED WITH: PHARMD ANDREW MEYERS 04/04/2021 AT 2050 A.HUGHES Performed at [facility], [place] [address],  [place], [place] [address]    Culture (A)  Final    STREPTOCOCCUS ANGINOSIS SUSCEPTIBILITIES PERFORMED ON PREVIOUS CULTURE WITHIN THE LAST 5 DAYS. ENTEROCOCCUS FAECALIS    Report Status 04/07/2021 FINAL  Final   Organism ID, Bacteria ENTEROCOCCUS FAECALIS  Final      Susceptibility   Enterococcus faecalis - MIC*    AMPICILLIN <=2 SENSITIVE Sensitive     VANCOMYCIN 1 SENSITIVE Sensitive     GENTAMICIN SYNERGY RESISTANT Resistant     * ENTEROCOCCUS FAECALIS  Culture, blood (routine x 2)     Status: Abnormal   Collection Time: 04/04/21 12:17 AM   Specimen: BLOOD RIGHT WRIST  Result Value Ref Range Status   Specimen Description BLOOD RIGHT WRIST  Final   Special Requests   Final    BOTTLES DRAWN AEROBIC AND ANAEROBIC Blood Culture adequate volume   Culture  Setup Time   Final    GRAM POSITIVE COCCI IN CHAINS IN BOTH AEROBIC AND ANAEROBIC BOTTLES CRITICAL RESULT CALLED TO, READ BACK BY AND VERIFIED WITH: PHARMD ANDREW MEYER 04/04/2021 AT 2050 A.HUGHES Performed at [facility], [place] [address] [place], [place] [address]    Culture STREPTOCOCCUS ANGINOSIS (A)  Final   Report Status 04/06/2021 FINAL  Final   Organism ID, Bacteria STREPTOCOCCUS ANGINOSIS  Final      Susceptibility   Streptococcus anginosis - MIC*    PENICILLIN 0.12 SENSITIVE Sensitive     CEFTRIAXONE 0.5 SENSITIVE Sensitive     ERYTHROMYCIN <=0.12 SENSITIVE Sensitive     LEVOFLOXACIN 0.5 SENSITIVE Sensitive     VANCOMYCIN 0.5 SENSITIVE Sensitive     * STREPTOCOCCUS ANGINOSIS  Blood Culture ID Panel (Reflexed)     Status: Abnormal   Collection Time: 04/04/21 12:17 AM  Result Value Ref Range Status   Enterococcus faecalis NOT DETECTED NOT DETECTED Final   Enterococcus Faecium NOT DETECTED NOT DETECTED Final   Listeria monocytogenes NOT DETECTED NOT DETECTED Final   Staphylococcus species NOT DETECTED NOT DETECTED Final   Staphylococcus aureus (BCID) NOT DETECTED NOT DETECTED Final   Staphylococcus epidermidis  NOT DETECTED NOT DETECTED Final   Staphylococcus lugdunensis NOT DETECTED NOT DETECTED Final   Streptococcus species DETECTED (A) NOT DETECTED Final    Comment: Not Enterococcus species, Streptococcus agalactiae, Streptococcus pyogenes, or Streptococcus pneumoniae. CRITICAL RESULT CALLED TO, READ BACK  BY AND VERIFIED WITH: PHARMD ANDREW MEYER 04/04/2021 AT 2250 A.HUGHES    Streptococcus agalactiae NOT DETECTED NOT DETECTED Final   Streptococcus pneumoniae NOT DETECTED NOT DETECTED Final   Streptococcus pyogenes NOT DETECTED NOT DETECTED Final   A.calcoaceticus-baumannii NOT DETECTED NOT DETECTED Final   Bacteroides fragilis NOT DETECTED NOT DETECTED Final   Enterobacterales NOT DETECTED NOT DETECTED Final   Enterobacter cloacae complex NOT DETECTED NOT DETECTED Final   Escherichia coli NOT DETECTED NOT DETECTED Final   Klebsiella aerogenes NOT DETECTED NOT DETECTED Final   Klebsiella oxytoca NOT DETECTED NOT DETECTED Final   Klebsiella pneumoniae NOT DETECTED NOT DETECTED Final   Proteus species NOT DETECTED NOT DETECTED Final   Salmonella species NOT DETECTED NOT DETECTED Final   Serratia marcescens NOT DETECTED NOT DETECTED Final   Haemophilus influenzae NOT DETECTED NOT DETECTED Final   Neisseria meningitidis NOT DETECTED NOT DETECTED Final   Pseudomonas aeruginosa NOT DETECTED NOT DETECTED Final   Stenotrophomonas maltophilia NOT DETECTED NOT DETECTED Final   Candida albicans NOT DETECTED NOT DETECTED Final   Candida auris NOT DETECTED NOT DETECTED Final   Candida glabrata NOT DETECTED NOT DETECTED Final   Candida krusei NOT DETECTED NOT DETECTED Final   Candida parapsilosis NOT DETECTED NOT DETECTED Final   Candida tropicalis NOT DETECTED NOT DETECTED Final   Cryptococcus neoformans/gattii NOT DETECTED NOT DETECTED Final    Comment: Performed at [facility], [address] [place], [place] [address]  Urine Culture     Status: Abnormal   Collection Time: 04/04/21 12:24  PM   Specimen: Urine, Random  Result Value Ref Range Status   Specimen Description URINE, RANDOM  Final   Special Requests   Final    NONE Performed at [facility], [place] [address], [place], [place] [address]    Culture MULTIPLE SPECIES PRESENT, SUGGEST RECOLLECTION (A)  Final   Report Status 04/05/2021 FINAL  Final  Culture, blood (Routine X 2) w Reflex to ID Panel     Status: None (Preliminary result)   Collection Time: 04/06/21  8:41 AM   Specimen: BLOOD  Result Value Ref Range Status   Specimen Description BLOOD RIGHT ANTECUBITAL  Final   Special Requests   Final    BOTTLES DRAWN AEROBIC AND ANAEROBIC Blood Culture adequate volume   Culture   Final    NO GROWTH < 24 HOURS Performed at [facility], [place] [address], [place], [place] [address]    Report Status PENDING  Incomplete  Culture, blood (Routine X 2) w Reflex to ID Panel     Status: None (Preliminary result)   Collection Time: 04/06/21  8:48 AM   Specimen: BLOOD LEFT HAND  Result Value Ref Range Status   Specimen Description BLOOD LEFT HAND  Final   Special Requests   Final    BOTTLES DRAWN AEROBIC ONLY Blood Culture adequate volume   Culture   Final    NO GROWTH < 24 HOURS Performed at [facility], [place] [address] [place], [place] [address]    Report Status PENDING  Incomplete    Gaylan Gerold, [facility] [phone] pager   [phone] cell 04/07/2021, 10:25 AM

## 2021-04-08 ENCOUNTER — Inpatient Hospital Stay: Payer: Self-pay

## 2021-04-08 ENCOUNTER — Other Ambulatory Visit (HOSPITAL_COMMUNITY): Payer: Self-pay

## 2021-04-08 ENCOUNTER — Inpatient Hospital Stay (HOSPITAL_COMMUNITY): Payer: PPO

## 2021-04-08 ENCOUNTER — Encounter (HOSPITAL_COMMUNITY): Payer: Self-pay | Admitting: Internal Medicine

## 2021-04-08 LAB — BASIC METABOLIC PANEL
Anion gap: 9 (ref 5–15)
BUN: 15 mg/dL (ref 8–23)
CO2: 34 mmol/L — ABNORMAL HIGH (ref 22–32)
Calcium: 8.5 mg/dL — ABNORMAL LOW (ref 8.9–10.3)
Chloride: 92 mmol/L — ABNORMAL LOW (ref 98–111)
Creatinine, Ser: 0.76 mg/dL (ref 0.61–1.24)
GFR, Estimated: >60 mL/min (ref >60)
Glucose, Bld: 100 mg/dL — ABNORMAL HIGH (ref 70–99)
Potassium: 4.6 mmol/L (ref 3.5–5.1)
Sodium: 135 mmol/L (ref 135–145)

## 2021-04-08 LAB — CBC
HCT: 37.2 % — ABNORMAL LOW (ref 39.0–52.0)
Hemoglobin: 12 g/dL — ABNORMAL LOW (ref 13.0–17.0)
MCH: 29.1 pg (ref 26.0–34.0)
MCHC: 32.3 g/dL (ref 30.0–36.0)
MCV: 90.3 fL (ref 80.0–100.0)
Platelets: 245 10*3/uL (ref 150–400)
RBC: 4.12 MIL/uL — ABNORMAL LOW (ref 4.22–5.81)
RDW: 14.5 % (ref 11.5–15.5)
WBC: 10.1 10*3/uL (ref 4.0–10.5)
nRBC: 0 % (ref 0.0–0.2)

## 2021-04-08 LAB — GLUCOSE, CAPILLARY
Glucose-Capillary: 100 mg/dL — ABNORMAL HIGH (ref 70–99)
Glucose-Capillary: 109 mg/dL — ABNORMAL HIGH (ref 70–99)
Glucose-Capillary: 166 mg/dL — ABNORMAL HIGH (ref 70–99)
Glucose-Capillary: 78 mg/dL (ref 70–99)
Glucose-Capillary: 89 mg/dL (ref 70–99)

## 2021-04-08 LAB — MAGNESIUM: Magnesium: 2.2 mg/dL (ref 1.7–2.4)

## 2021-04-08 MED ORDER — CHLORHEXIDINE GLUCONATE CLOTH 2 % EX PADS: 6.0000 | MEDICATED_PAD | Freq: Every day | CUTANEOUS | Status: DC

## 2021-04-08 MED ORDER — SODIUM CHLORIDE 0.9% FLUSH
10.0000 mL | INTRAVENOUS | Status: DC | PRN
Start: 2021-04-08 — End: 2021-04-09

## 2021-04-08 MED ORDER — FUROSEMIDE 40 MG PO TABS
40.0000 mg | ORAL_TABLET | Freq: Every day | ORAL | 0 refills | Status: DC | PRN
  Filled 2021-04-08: qty 30, 30d supply, fill #0

## 2021-04-08 MED ORDER — FUROSEMIDE 10 MG/ML IJ SOLN
40.0000 mg | Freq: Once | INTRAMUSCULAR | Status: AC
  Administered 2021-04-08: 40 mg via INTRAVENOUS
  Filled 2021-04-08: qty 4

## 2021-04-08 MED ORDER — AMPICILLIN IV (FOR PTA / DISCHARGE USE ONLY): 12.0000 g | INTRAVENOUS | 0 refills | Status: AC

## 2021-04-08 NOTE — Discharge Summary (Signed)
identified in the pulmonary arteries to suggest acute pulmonary  embolism. Calcified coronary artery atherosclerosis (series 6, image 208). Borderline to mild cardiomegaly. No pericardial effusion. Calcified aortic atherosclerosis. Left subclavian approach cardiac pacemaker. Mediastinum/Nodes: Negative.  No mediastinal lymphadenopathy. Lungs/Pleura: Centrilobular emphysema. Narrow transverse and increased AP dimension of the trachea. Central peribronchial thickening. But the major airways remain patent. Chronic linear atelectasis in the lingula is stable since 2020. Similar mild linear scarring at the level of the left hilum affecting the upper lobe. Minimal linear scarring or atelectasis in the lateral segment of the right middle lobe and both costophrenic angles. No pleural effusion. Upper Abdomen: Mild reflux into the IVC and hepatic veins. Otherwise negative visible liver, gallbladder, spleen, pancreas, adrenal glands and bowel in the upper abdomen. Visible renal upper poles appears stable. 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 No acute osseous abnormality identified. Review of the MIP images confirms the above findings. IMPRESSION: 1. Negative for acute pulmonary embolus. 2. Chronic lung disease with Emphysema (ICD10-J43.9) and scattered linear lung scarring or atelectasis. 3. Calcified coronary artery and Aortic Atherosclerosis (ICD10-I70.0). Electronically Signed   By: Genevie Ann M.D.   On: 03/31/2021 04:07   DG Chest Port 1 View  Result Date: 03/30/2021 CLINICAL DATA:  Shortness of breath, chills EXAM: PORTABLE CHEST 1 VIEW COMPARISON:  10/30/2020 FINDINGS: Left pacer remains in place, unchanged. Heart is borderline in size. Mild peribronchial thickening. Increased markings at the lung bases, likely atelectasis or scarring, similar to prior study. No effusions. No acute bony abnormality. IMPRESSION: Mild bronchitic changes. Bibasilar atelectasis or scarring. Electronically Signed    By: Rolm Baptise M.D.   On: 03/30/2021 22:54   DG Foot Complete Right  Result Date: 04/05/2021 CLINICAL DATA:  Right heel diabetic wound for several months. EXAM: RIGHT FOOT COMPLETE - 3+ VIEW COMPARISON:  01/31/2017. FINDINGS: Previously demonstrated Charcot changes and changes of previous osteomyelitis with areas of bone fusion and severe irregularity and deformity. No areas of acute bone destruction or periosteal reaction seen. Diffuse soft tissue swelling without soft tissue gas. 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 Electronically Signed   By: Claudie Revering M.D.   On: 04/05/2021 20:35   ECHOCARDIOGRAM COMPLETE  Result Date: 03/31/2021    ECHOCARDIOGRAM REPORT   Patient Name:   Nathan Miller Date of Exam: 03/31/2021 Medical Rec #:  [id number]    Height:       69.0 in Accession #:    [id number]   Weight:       245.0 lb Date of Birth:  [birth year]   BSA:          2.252 m Patient Age:    70 years     BP:           123/61 mmHg Patient Gender: M            HR:           69 bpm. Exam Location:  Inpatient Procedure: 2D Echo Indications:    Dyspnea R06.00  History:        Patient has prior history of Echocardiogram examinations, most                 recent 01/23/2015. CAD, Signs/Symptoms:Syncope; Risk                 Factors:Hypertension,  identified in the pulmonary arteries to suggest acute pulmonary  embolism. Calcified coronary artery atherosclerosis (series 6, image 208). Borderline to mild cardiomegaly. No pericardial effusion. Calcified aortic atherosclerosis. Left subclavian approach cardiac pacemaker. Mediastinum/Nodes: Negative.  No mediastinal lymphadenopathy. Lungs/Pleura: Centrilobular emphysema. Narrow transverse and increased AP dimension of the trachea. Central peribronchial thickening. But the major airways remain patent. Chronic linear atelectasis in the lingula is stable since 2020. Similar mild linear scarring at the level of the left hilum affecting the upper lobe. Minimal linear scarring or atelectasis in the lateral segment of the right middle lobe and both costophrenic angles. No pleural effusion. Upper Abdomen: Mild reflux into the IVC and hepatic veins. Otherwise negative visible liver, gallbladder, spleen, pancreas, adrenal glands and bowel in the upper abdomen. Visible renal upper poles appears stable. 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 No acute osseous abnormality identified. Review of the MIP images confirms the above findings. IMPRESSION: 1. Negative for acute pulmonary embolus. 2. Chronic lung disease with Emphysema (ICD10-J43.9) and scattered linear lung scarring or atelectasis. 3. Calcified coronary artery and Aortic Atherosclerosis (ICD10-I70.0). Electronically Signed   By: Genevie Ann M.D.   On: 03/31/2021 04:07   DG Chest Port 1 View  Result Date: 03/30/2021 CLINICAL DATA:  Shortness of breath, chills EXAM: PORTABLE CHEST 1 VIEW COMPARISON:  10/30/2020 FINDINGS: Left pacer remains in place, unchanged. Heart is borderline in size. Mild peribronchial thickening. Increased markings at the lung bases, likely atelectasis or scarring, similar to prior study. No effusions. No acute bony abnormality. IMPRESSION: Mild bronchitic changes. Bibasilar atelectasis or scarring. Electronically Signed    By: Rolm Baptise M.D.   On: 03/30/2021 22:54   DG Foot Complete Right  Result Date: 04/05/2021 CLINICAL DATA:  Right heel diabetic wound for several months. EXAM: RIGHT FOOT COMPLETE - 3+ VIEW COMPARISON:  01/31/2017. FINDINGS: Previously demonstrated Charcot changes and changes of previous osteomyelitis with areas of bone fusion and severe irregularity and deformity. No areas of acute bone destruction or periosteal reaction seen. Diffuse soft tissue swelling without soft tissue gas. 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 Electronically Signed   By: Claudie Revering M.D.   On: 04/05/2021 20:35   ECHOCARDIOGRAM COMPLETE  Result Date: 03/31/2021    ECHOCARDIOGRAM REPORT   Patient Name:   Nathan Miller Date of Exam: 03/31/2021 Medical Rec #:  [id number]    Height:       69.0 in Accession #:    [id number]   Weight:       245.0 lb Date of Birth:  [birth year]   BSA:          2.252 m Patient Age:    70 years     BP:           123/61 mmHg Patient Gender: M            HR:           69 bpm. Exam Location:  Inpatient Procedure: 2D Echo Indications:    Dyspnea R06.00  History:        Patient has prior history of Echocardiogram examinations, most                 recent 01/23/2015. CAD, Signs/Symptoms:Syncope; Risk                 Factors:Hypertension,  Physician Discharge Summary  Nathan Miller POE:[id number] DOB: 07-24-1951 DOA: 03/30/2021  PCP: [facility]  Admit date: 03/30/2021 Discharge date: 04/08/2021  Admitted From: Home Disposition: Home  Recommendations for Outpatient Follow-up:  Follow up with PCP in 1-2 weeks Please obtain BMP/CBC in one week your next doctors visit.  PICC line placed, IV cefazolin for 2 weeks Follow-up outpatient ID Take Lasix 40 mg daily as needed for weight gain, shortness of breath, lower extremity swelling as directed.  Discharge Condition: Stable CODE STATUS: Full code Diet recommendation: Heart healthy  Brief/Interim Summary: 70 y.o. male with medical history significant for CAD, heart block s/p implanted pacemaker, DM, CHF who presents by EMS for evaluation of SOB with chest pain. He reports he laid down for a nap around 1 pm on 03/30/21 and when he woke up he had SOB, chills and chest pain. Claimed to have fevers prior to visit. Pt initially required 3LNC in ED, improved after one dose of IV lasix.  During hospital stay blood cultures became positive for strep bacteremia therefore started on IV Rocephin, ID consulted.  Echocardiogram did not show any vegetation therefore TEE 6/9 Negative for endocarditis, showed small PFO.  Repeat blood cultures remain negative, IV PICC line was ordered with anticipation of 2 weeks of IV cefazolin and follow-up with ID clinic in 2 weeks.  During the hospitalization, patient did not want me to speak with his family member as he stated he would update them on his own.  Today is medically stable to be discharged.     Assessment & Plan:   Principal Problem:   Acute respiratory failure with hypoxia (HCC) Active Problems:   Essential hypertension   CAD S/P percutaneous coronary angioplasty; bms X 2 to RCA 2001   Complete heart block (HCC)   Diabetes type 2, controlled (HCC)   Cardiac pacemaker in situ   Obesity with body mass index (BMI)  of 30.0 to 39.9   Elevated d-dimer   Elevated brain natriuretic peptide (BNP) level   Strep bacteremia with sepsis not present on admit -Sepsis not present on admission.  Cultures growing Streptococcus.  WBC improving - Echocardiogram, TEE = negative for endocarditis. - PICC line to be placed prior to discharge, 2 weeks of IV cefazolin and follow-up in ID clinic in 2 weeks.   Acute hypoxic respiratory failure secondary to CHF, improved Acute congestive heart failure with preserved ejection fraction, EF 55%, grade 2 DD, improved -Improved with IV diuretics. -CTA chest negative for PE - Echocardiogram- EF 55 to 60%, grade 2 DD - 40 mg Lasix daily as needed.  As needed instructions given    Essential hypertension - Resume home medications   Diabetes mellitus type 2 -A1c 6.4.  Resume home metformin   Elevated D-dimer -CTA chest negative for PE     Complete heart block History of CAD status post PCI 2001 -Pacemaker in place.  Chest pain-free - Lipitor 80 mg daily   ARF, improved -Renal function is now stable       Does have a full-thickness wound on the plantar surface of his foot without any obvious evidence of active infection.  Body mass index is 38.68 kg/m.         Discharge Diagnoses:  Principal Problem:   Acute respiratory failure with hypoxia ([place]) Active Problems:   Essential hypertension   CAD S/P percutaneous coronary angioplasty; bms X 2 to RCA 2001   Complete heart block (HCC)   Diabetes type 2,  identified in the pulmonary arteries to suggest acute pulmonary  embolism. Calcified coronary artery atherosclerosis (series 6, image 208). Borderline to mild cardiomegaly. No pericardial effusion. Calcified aortic atherosclerosis. Left subclavian approach cardiac pacemaker. Mediastinum/Nodes: Negative.  No mediastinal lymphadenopathy. Lungs/Pleura: Centrilobular emphysema. Narrow transverse and increased AP dimension of the trachea. Central peribronchial thickening. But the major airways remain patent. Chronic linear atelectasis in the lingula is stable since 2020. Similar mild linear scarring at the level of the left hilum affecting the upper lobe. Minimal linear scarring or atelectasis in the lateral segment of the right middle lobe and both costophrenic angles. No pleural effusion. Upper Abdomen: Mild reflux into the IVC and hepatic veins. Otherwise negative visible liver, gallbladder, spleen, pancreas, adrenal glands and bowel in the upper abdomen. Visible renal upper poles appears stable. 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 No acute osseous abnormality identified. Review of the MIP images confirms the above findings. IMPRESSION: 1. Negative for acute pulmonary embolus. 2. Chronic lung disease with Emphysema (ICD10-J43.9) and scattered linear lung scarring or atelectasis. 3. Calcified coronary artery and Aortic Atherosclerosis (ICD10-I70.0). Electronically Signed   By: Genevie Ann M.D.   On: 03/31/2021 04:07   DG Chest Port 1 View  Result Date: 03/30/2021 CLINICAL DATA:  Shortness of breath, chills EXAM: PORTABLE CHEST 1 VIEW COMPARISON:  10/30/2020 FINDINGS: Left pacer remains in place, unchanged. Heart is borderline in size. Mild peribronchial thickening. Increased markings at the lung bases, likely atelectasis or scarring, similar to prior study. No effusions. No acute bony abnormality. IMPRESSION: Mild bronchitic changes. Bibasilar atelectasis or scarring. Electronically Signed    By: Rolm Baptise M.D.   On: 03/30/2021 22:54   DG Foot Complete Right  Result Date: 04/05/2021 CLINICAL DATA:  Right heel diabetic wound for several months. EXAM: RIGHT FOOT COMPLETE - 3+ VIEW COMPARISON:  01/31/2017. FINDINGS: Previously demonstrated Charcot changes and changes of previous osteomyelitis with areas of bone fusion and severe irregularity and deformity. No areas of acute bone destruction or periosteal reaction seen. Diffuse soft tissue swelling without soft tissue gas. 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 Electronically Signed   By: Claudie Revering M.D.   On: 04/05/2021 20:35   ECHOCARDIOGRAM COMPLETE  Result Date: 03/31/2021    ECHOCARDIOGRAM REPORT   Patient Name:   Nathan Miller Date of Exam: 03/31/2021 Medical Rec #:  [id number]    Height:       69.0 in Accession #:    [id number]   Weight:       245.0 lb Date of Birth:  [birth year]   BSA:          2.252 m Patient Age:    70 years     BP:           123/61 mmHg Patient Gender: M            HR:           69 bpm. Exam Location:  Inpatient Procedure: 2D Echo Indications:    Dyspnea R06.00  History:        Patient has prior history of Echocardiogram examinations, most                 recent 01/23/2015. CAD, Signs/Symptoms:Syncope; Risk                 Factors:Hypertension,  Physician Discharge Summary  Nathan Miller POE:[id number] DOB: 07-24-1951 DOA: 03/30/2021  PCP: [facility]  Admit date: 03/30/2021 Discharge date: 04/08/2021  Admitted From: Home Disposition: Home  Recommendations for Outpatient Follow-up:  Follow up with PCP in 1-2 weeks Please obtain BMP/CBC in one week your next doctors visit.  PICC line placed, IV cefazolin for 2 weeks Follow-up outpatient ID Take Lasix 40 mg daily as needed for weight gain, shortness of breath, lower extremity swelling as directed.  Discharge Condition: Stable CODE STATUS: Full code Diet recommendation: Heart healthy  Brief/Interim Summary: 70 y.o. male with medical history significant for CAD, heart block s/p implanted pacemaker, DM, CHF who presents by EMS for evaluation of SOB with chest pain. He reports he laid down for a nap around 1 pm on 03/30/21 and when he woke up he had SOB, chills and chest pain. Claimed to have fevers prior to visit. Pt initially required 3LNC in ED, improved after one dose of IV lasix.  During hospital stay blood cultures became positive for strep bacteremia therefore started on IV Rocephin, ID consulted.  Echocardiogram did not show any vegetation therefore TEE 6/9 Negative for endocarditis, showed small PFO.  Repeat blood cultures remain negative, IV PICC line was ordered with anticipation of 2 weeks of IV cefazolin and follow-up with ID clinic in 2 weeks.  During the hospitalization, patient did not want me to speak with his family member as he stated he would update them on his own.  Today is medically stable to be discharged.     Assessment & Plan:   Principal Problem:   Acute respiratory failure with hypoxia (HCC) Active Problems:   Essential hypertension   CAD S/P percutaneous coronary angioplasty; bms X 2 to RCA 2001   Complete heart block (HCC)   Diabetes type 2, controlled (HCC)   Cardiac pacemaker in situ   Obesity with body mass index (BMI)  of 30.0 to 39.9   Elevated d-dimer   Elevated brain natriuretic peptide (BNP) level   Strep bacteremia with sepsis not present on admit -Sepsis not present on admission.  Cultures growing Streptococcus.  WBC improving - Echocardiogram, TEE = negative for endocarditis. - PICC line to be placed prior to discharge, 2 weeks of IV cefazolin and follow-up in ID clinic in 2 weeks.   Acute hypoxic respiratory failure secondary to CHF, improved Acute congestive heart failure with preserved ejection fraction, EF 55%, grade 2 DD, improved -Improved with IV diuretics. -CTA chest negative for PE - Echocardiogram- EF 55 to 60%, grade 2 DD - 40 mg Lasix daily as needed.  As needed instructions given    Essential hypertension - Resume home medications   Diabetes mellitus type 2 -A1c 6.4.  Resume home metformin   Elevated D-dimer -CTA chest negative for PE     Complete heart block History of CAD status post PCI 2001 -Pacemaker in place.  Chest pain-free - Lipitor 80 mg daily   ARF, improved -Renal function is now stable       Does have a full-thickness wound on the plantar surface of his foot without any obvious evidence of active infection.  Body mass index is 38.68 kg/m.         Discharge Diagnoses:  Principal Problem:   Acute respiratory failure with hypoxia ([place]) Active Problems:   Essential hypertension   CAD S/P percutaneous coronary angioplasty; bms X 2 to RCA 2001   Complete heart block (HCC)   Diabetes type 2,  identified in the pulmonary arteries to suggest acute pulmonary  embolism. Calcified coronary artery atherosclerosis (series 6, image 208). Borderline to mild cardiomegaly. No pericardial effusion. Calcified aortic atherosclerosis. Left subclavian approach cardiac pacemaker. Mediastinum/Nodes: Negative.  No mediastinal lymphadenopathy. Lungs/Pleura: Centrilobular emphysema. Narrow transverse and increased AP dimension of the trachea. Central peribronchial thickening. But the major airways remain patent. Chronic linear atelectasis in the lingula is stable since 2020. Similar mild linear scarring at the level of the left hilum affecting the upper lobe. Minimal linear scarring or atelectasis in the lateral segment of the right middle lobe and both costophrenic angles. No pleural effusion. Upper Abdomen: Mild reflux into the IVC and hepatic veins. Otherwise negative visible liver, gallbladder, spleen, pancreas, adrenal glands and bowel in the upper abdomen. Visible renal upper poles appears stable. 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 No acute osseous abnormality identified. Review of the MIP images confirms the above findings. IMPRESSION: 1. Negative for acute pulmonary embolus. 2. Chronic lung disease with Emphysema (ICD10-J43.9) and scattered linear lung scarring or atelectasis. 3. Calcified coronary artery and Aortic Atherosclerosis (ICD10-I70.0). Electronically Signed   By: Genevie Ann M.D.   On: 03/31/2021 04:07   DG Chest Port 1 View  Result Date: 03/30/2021 CLINICAL DATA:  Shortness of breath, chills EXAM: PORTABLE CHEST 1 VIEW COMPARISON:  10/30/2020 FINDINGS: Left pacer remains in place, unchanged. Heart is borderline in size. Mild peribronchial thickening. Increased markings at the lung bases, likely atelectasis or scarring, similar to prior study. No effusions. No acute bony abnormality. IMPRESSION: Mild bronchitic changes. Bibasilar atelectasis or scarring. Electronically Signed    By: Rolm Baptise M.D.   On: 03/30/2021 22:54   DG Foot Complete Right  Result Date: 04/05/2021 CLINICAL DATA:  Right heel diabetic wound for several months. EXAM: RIGHT FOOT COMPLETE - 3+ VIEW COMPARISON:  01/31/2017. FINDINGS: Previously demonstrated Charcot changes and changes of previous osteomyelitis with areas of bone fusion and severe irregularity and deformity. No areas of acute bone destruction or periosteal reaction seen. Diffuse soft tissue swelling without soft tissue gas. 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 Electronically Signed   By: Claudie Revering M.D.   On: 04/05/2021 20:35   ECHOCARDIOGRAM COMPLETE  Result Date: 03/31/2021    ECHOCARDIOGRAM REPORT   Patient Name:   Nathan Miller Date of Exam: 03/31/2021 Medical Rec #:  [id number]    Height:       69.0 in Accession #:    [id number]   Weight:       245.0 lb Date of Birth:  [birth year]   BSA:          2.252 m Patient Age:    70 years     BP:           123/61 mmHg Patient Gender: M            HR:           69 bpm. Exam Location:  Inpatient Procedure: 2D Echo Indications:    Dyspnea R06.00  History:        Patient has prior history of Echocardiogram examinations, most                 recent 01/23/2015. CAD, Signs/Symptoms:Syncope; Risk                 Factors:Hypertension,  identified in the pulmonary arteries to suggest acute pulmonary  embolism. Calcified coronary artery atherosclerosis (series 6, image 208). Borderline to mild cardiomegaly. No pericardial effusion. Calcified aortic atherosclerosis. Left subclavian approach cardiac pacemaker. Mediastinum/Nodes: Negative.  No mediastinal lymphadenopathy. Lungs/Pleura: Centrilobular emphysema. Narrow transverse and increased AP dimension of the trachea. Central peribronchial thickening. But the major airways remain patent. Chronic linear atelectasis in the lingula is stable since 2020. Similar mild linear scarring at the level of the left hilum affecting the upper lobe. Minimal linear scarring or atelectasis in the lateral segment of the right middle lobe and both costophrenic angles. No pleural effusion. Upper Abdomen: Mild reflux into the IVC and hepatic veins. Otherwise negative visible liver, gallbladder, spleen, pancreas, adrenal glands and bowel in the upper abdomen. Visible renal upper poles appears stable. 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 No acute osseous abnormality identified. Review of the MIP images confirms the above findings. IMPRESSION: 1. Negative for acute pulmonary embolus. 2. Chronic lung disease with Emphysema (ICD10-J43.9) and scattered linear lung scarring or atelectasis. 3. Calcified coronary artery and Aortic Atherosclerosis (ICD10-I70.0). Electronically Signed   By: Genevie Ann M.D.   On: 03/31/2021 04:07   DG Chest Port 1 View  Result Date: 03/30/2021 CLINICAL DATA:  Shortness of breath, chills EXAM: PORTABLE CHEST 1 VIEW COMPARISON:  10/30/2020 FINDINGS: Left pacer remains in place, unchanged. Heart is borderline in size. Mild peribronchial thickening. Increased markings at the lung bases, likely atelectasis or scarring, similar to prior study. No effusions. No acute bony abnormality. IMPRESSION: Mild bronchitic changes. Bibasilar atelectasis or scarring. Electronically Signed    By: Rolm Baptise M.D.   On: 03/30/2021 22:54   DG Foot Complete Right  Result Date: 04/05/2021 CLINICAL DATA:  Right heel diabetic wound for several months. EXAM: RIGHT FOOT COMPLETE - 3+ VIEW COMPARISON:  01/31/2017. FINDINGS: Previously demonstrated Charcot changes and changes of previous osteomyelitis with areas of bone fusion and severe irregularity and deformity. No areas of acute bone destruction or periosteal reaction seen. Diffuse soft tissue swelling without soft tissue gas. 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 Electronically Signed   By: Claudie Revering M.D.   On: 04/05/2021 20:35   ECHOCARDIOGRAM COMPLETE  Result Date: 03/31/2021    ECHOCARDIOGRAM REPORT   Patient Name:   Nathan Miller Date of Exam: 03/31/2021 Medical Rec #:  [id number]    Height:       69.0 in Accession #:    [id number]   Weight:       245.0 lb Date of Birth:  [birth year]   BSA:          2.252 m Patient Age:    70 years     BP:           123/61 mmHg Patient Gender: M            HR:           69 bpm. Exam Location:  Inpatient Procedure: 2D Echo Indications:    Dyspnea R06.00  History:        Patient has prior history of Echocardiogram examinations, most                 recent 01/23/2015. CAD, Signs/Symptoms:Syncope; Risk                 Factors:Hypertension,  Physician Discharge Summary  Nathan Miller POE:[id number] DOB: 25-May-1951 DOA: 03/30/2021  PCP: [facility]  Admit date: 03/30/2021 Discharge date: 04/08/2021  Admitted From: Home Disposition: Home  Recommendations for Outpatient Follow-up:  Follow up with PCP in 1-2 weeks Please obtain BMP/CBC in one week your next doctors visit.  PICC line placed, IV cefazolin for 2 weeks Follow-up outpatient ID Take Lasix 40 mg daily as needed for weight gain, shortness of breath, lower extremity swelling as directed.  Discharge Condition: Stable CODE STATUS: Full code Diet recommendation: Heart healthy  Brief/Interim Summary: 70 y.o. male with medical history significant for CAD, heart block s/p implanted pacemaker, DM, CHF who presents by EMS for evaluation of SOB with chest pain. He reports he laid down for a nap around 1 pm on 03/30/21 and when he woke up he had SOB, chills and chest pain. Claimed to have fevers prior to visit. Pt initially required 3LNC in ED, improved after one dose of IV lasix.  During hospital stay blood cultures became positive for strep bacteremia therefore started on IV Rocephin, ID consulted.  Echocardiogram did not show any vegetation therefore TEE 6/9 Negative for endocarditis, showed small PFO.  Repeat blood cultures remain negative, IV PICC line was ordered with anticipation of 2 weeks of IV cefazolin and follow-up with ID clinic in 2 weeks.  During the hospitalization, patient did not want me to speak with his family member as he stated he would update them on his own.  Today is medically stable to be discharged.     Assessment & Plan:   Principal Problem:   Acute respiratory failure with hypoxia (HCC) Active Problems:   Essential hypertension   CAD S/P percutaneous coronary angioplasty; bms X 2 to RCA 2001   Complete heart block (HCC)   Diabetes type 2, controlled (HCC)   Cardiac pacemaker in situ   Obesity with body mass index (BMI)  of 30.0 to 39.9   Elevated d-dimer   Elevated brain natriuretic peptide (BNP) level   Strep bacteremia with sepsis not present on admit -Sepsis not present on admission.  Cultures growing Streptococcus.  WBC improving - Echocardiogram, TEE = negative for endocarditis. - PICC line to be placed prior to discharge, 2 weeks of IV cefazolin and follow-up in ID clinic in 2 weeks.   Acute hypoxic respiratory failure secondary to CHF, improved Acute congestive heart failure with preserved ejection fraction, EF 55%, grade 2 DD, improved -Improved with IV diuretics. -CTA chest negative for PE - Echocardiogram- EF 55 to 60%, grade 2 DD - 40 mg Lasix daily as needed.  As needed instructions given    Essential hypertension - Resume home medications   Diabetes mellitus type 2 -A1c 6.4.  Resume home metformin   Elevated D-dimer -CTA chest negative for PE     Complete heart block History of CAD status post PCI 2001 -Pacemaker in place.  Chest pain-free - Lipitor 80 mg daily   ARF, improved -Renal function is now stable       Does have a full-thickness wound on the plantar surface of his foot without any obvious evidence of active infection.  Body mass index is 38.68 kg/m.         Discharge Diagnoses:  Principal Problem:   Acute respiratory failure with hypoxia ([place]) Active Problems:   Essential hypertension   CAD S/P percutaneous coronary angioplasty; bms X 2 to RCA 2001   Complete heart block (HCC)   Diabetes type 2,  identified in the pulmonary arteries to suggest acute pulmonary  embolism. Calcified coronary artery atherosclerosis (series 6, image 208). Borderline to mild cardiomegaly. No pericardial effusion. Calcified aortic atherosclerosis. Left subclavian approach cardiac pacemaker. Mediastinum/Nodes: Negative.  No mediastinal lymphadenopathy. Lungs/Pleura: Centrilobular emphysema. Narrow transverse and increased AP dimension of the trachea. Central peribronchial thickening. But the major airways remain patent. Chronic linear atelectasis in the lingula is stable since 2020. Similar mild linear scarring at the level of the left hilum affecting the upper lobe. Minimal linear scarring or atelectasis in the lateral segment of the right middle lobe and both costophrenic angles. No pleural effusion. Upper Abdomen: Mild reflux into the IVC and hepatic veins. Otherwise negative visible liver, gallbladder, spleen, pancreas, adrenal glands and bowel in the upper abdomen. Visible renal upper poles appears stable. 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 No acute osseous abnormality identified. Review of the MIP images confirms the above findings. IMPRESSION: 1. Negative for acute pulmonary embolus. 2. Chronic lung disease with Emphysema (ICD10-J43.9) and scattered linear lung scarring or atelectasis. 3. Calcified coronary artery and Aortic Atherosclerosis (ICD10-I70.0). Electronically Signed   By: Genevie Ann M.D.   On: 03/31/2021 04:07   DG Chest Port 1 View  Result Date: 03/30/2021 CLINICAL DATA:  Shortness of breath, chills EXAM: PORTABLE CHEST 1 VIEW COMPARISON:  10/30/2020 FINDINGS: Left pacer remains in place, unchanged. Heart is borderline in size. Mild peribronchial thickening. Increased markings at the lung bases, likely atelectasis or scarring, similar to prior study. No effusions. No acute bony abnormality. IMPRESSION: Mild bronchitic changes. Bibasilar atelectasis or scarring. Electronically Signed    By: Rolm Baptise M.D.   On: 03/30/2021 22:54   DG Foot Complete Right  Result Date: 04/05/2021 CLINICAL DATA:  Right heel diabetic wound for several months. EXAM: RIGHT FOOT COMPLETE - 3+ VIEW COMPARISON:  01/31/2017. FINDINGS: Previously demonstrated Charcot changes and changes of previous osteomyelitis with areas of bone fusion and severe irregularity and deformity. No areas of acute bone destruction or periosteal reaction seen. Diffuse soft tissue swelling without soft tissue gas. 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 Electronically Signed   By: Claudie Revering M.D.   On: 04/05/2021 20:35   ECHOCARDIOGRAM COMPLETE  Result Date: 03/31/2021    ECHOCARDIOGRAM REPORT   Patient Name:   Nathan Miller Date of Exam: 03/31/2021 Medical Rec #:  [id number]    Height:       69.0 in Accession #:    [id number]   Weight:       245.0 lb Date of Birth:  [birth year]   BSA:          2.252 m Patient Age:    70 years     BP:           123/61 mmHg Patient Gender: M            HR:           69 bpm. Exam Location:  Inpatient Procedure: 2D Echo Indications:    Dyspnea R06.00  History:        Patient has prior history of Echocardiogram examinations, most                 recent 01/23/2015. CAD, Signs/Symptoms:Syncope; Risk                 Factors:Hypertension,  Physician Discharge Summary  Nathan Miller POE:[id number] DOB: 07-24-1951 DOA: 03/30/2021  PCP: [facility]  Admit date: 03/30/2021 Discharge date: 04/08/2021  Admitted From: Home Disposition: Home  Recommendations for Outpatient Follow-up:  Follow up with PCP in 1-2 weeks Please obtain BMP/CBC in one week your next doctors visit.  PICC line placed, IV cefazolin for 2 weeks Follow-up outpatient ID Take Lasix 40 mg daily as needed for weight gain, shortness of breath, lower extremity swelling as directed.  Discharge Condition: Stable CODE STATUS: Full code Diet recommendation: Heart healthy  Brief/Interim Summary: 70 y.o. male with medical history significant for CAD, heart block s/p implanted pacemaker, DM, CHF who presents by EMS for evaluation of SOB with chest pain. He reports he laid down for a nap around 1 pm on 03/30/21 and when he woke up he had SOB, chills and chest pain. Claimed to have fevers prior to visit. Pt initially required 3LNC in ED, improved after one dose of IV lasix.  During hospital stay blood cultures became positive for strep bacteremia therefore started on IV Rocephin, ID consulted.  Echocardiogram did not show any vegetation therefore TEE 6/9 Negative for endocarditis, showed small PFO.  Repeat blood cultures remain negative, IV PICC line was ordered with anticipation of 2 weeks of IV cefazolin and follow-up with ID clinic in 2 weeks.  During the hospitalization, patient did not want me to speak with his family member as he stated he would update them on his own.  Today is medically stable to be discharged.     Assessment & Plan:   Principal Problem:   Acute respiratory failure with hypoxia (HCC) Active Problems:   Essential hypertension   CAD S/P percutaneous coronary angioplasty; bms X 2 to RCA 2001   Complete heart block (HCC)   Diabetes type 2, controlled (HCC)   Cardiac pacemaker in situ   Obesity with body mass index (BMI)  of 30.0 to 39.9   Elevated d-dimer   Elevated brain natriuretic peptide (BNP) level   Strep bacteremia with sepsis not present on admit -Sepsis not present on admission.  Cultures growing Streptococcus.  WBC improving - Echocardiogram, TEE = negative for endocarditis. - PICC line to be placed prior to discharge, 2 weeks of IV cefazolin and follow-up in ID clinic in 2 weeks.   Acute hypoxic respiratory failure secondary to CHF, improved Acute congestive heart failure with preserved ejection fraction, EF 55%, grade 2 DD, improved -Improved with IV diuretics. -CTA chest negative for PE - Echocardiogram- EF 55 to 60%, grade 2 DD - 40 mg Lasix daily as needed.  As needed instructions given    Essential hypertension - Resume home medications   Diabetes mellitus type 2 -A1c 6.4.  Resume home metformin   Elevated D-dimer -CTA chest negative for PE     Complete heart block History of CAD status post PCI 2001 -Pacemaker in place.  Chest pain-free - Lipitor 80 mg daily   ARF, improved -Renal function is now stable       Does have a full-thickness wound on the plantar surface of his foot without any obvious evidence of active infection.  Body mass index is 38.68 kg/m.         Discharge Diagnoses:  Principal Problem:   Acute respiratory failure with hypoxia ([place]) Active Problems:   Essential hypertension   CAD S/P percutaneous coronary angioplasty; bms X 2 to RCA 2001   Complete heart block (HCC)   Diabetes type 2,  Physician Discharge Summary  Nathan Miller POE:[id number] DOB: 25-May-1951 DOA: 03/30/2021  PCP: [facility]  Admit date: 03/30/2021 Discharge date: 04/08/2021  Admitted From: Home Disposition: Home  Recommendations for Outpatient Follow-up:  Follow up with PCP in 1-2 weeks Please obtain BMP/CBC in one week your next doctors visit.  PICC line placed, IV cefazolin for 2 weeks Follow-up outpatient ID Take Lasix 40 mg daily as needed for weight gain, shortness of breath, lower extremity swelling as directed.  Discharge Condition: Stable CODE STATUS: Full code Diet recommendation: Heart healthy  Brief/Interim Summary: 70 y.o. male with medical history significant for CAD, heart block s/p implanted pacemaker, DM, CHF who presents by EMS for evaluation of SOB with chest pain. He reports he laid down for a nap around 1 pm on 03/30/21 and when he woke up he had SOB, chills and chest pain. Claimed to have fevers prior to visit. Pt initially required 3LNC in ED, improved after one dose of IV lasix.  During hospital stay blood cultures became positive for strep bacteremia therefore started on IV Rocephin, ID consulted.  Echocardiogram did not show any vegetation therefore TEE 6/9 Negative for endocarditis, showed small PFO.  Repeat blood cultures remain negative, IV PICC line was ordered with anticipation of 2 weeks of IV cefazolin and follow-up with ID clinic in 2 weeks.  During the hospitalization, patient did not want me to speak with his family member as he stated he would update them on his own.  Today is medically stable to be discharged.     Assessment & Plan:   Principal Problem:   Acute respiratory failure with hypoxia (HCC) Active Problems:   Essential hypertension   CAD S/P percutaneous coronary angioplasty; bms X 2 to RCA 2001   Complete heart block (HCC)   Diabetes type 2, controlled (HCC)   Cardiac pacemaker in situ   Obesity with body mass index (BMI)  of 30.0 to 39.9   Elevated d-dimer   Elevated brain natriuretic peptide (BNP) level   Strep bacteremia with sepsis not present on admit -Sepsis not present on admission.  Cultures growing Streptococcus.  WBC improving - Echocardiogram, TEE = negative for endocarditis. - PICC line to be placed prior to discharge, 2 weeks of IV cefazolin and follow-up in ID clinic in 2 weeks.   Acute hypoxic respiratory failure secondary to CHF, improved Acute congestive heart failure with preserved ejection fraction, EF 55%, grade 2 DD, improved -Improved with IV diuretics. -CTA chest negative for PE - Echocardiogram- EF 55 to 60%, grade 2 DD - 40 mg Lasix daily as needed.  As needed instructions given    Essential hypertension - Resume home medications   Diabetes mellitus type 2 -A1c 6.4.  Resume home metformin   Elevated D-dimer -CTA chest negative for PE     Complete heart block History of CAD status post PCI 2001 -Pacemaker in place.  Chest pain-free - Lipitor 80 mg daily   ARF, improved -Renal function is now stable       Does have a full-thickness wound on the plantar surface of his foot without any obvious evidence of active infection.  Body mass index is 38.68 kg/m.         Discharge Diagnoses:  Principal Problem:   Acute respiratory failure with hypoxia ([place]) Active Problems:   Essential hypertension   CAD S/P percutaneous coronary angioplasty; bms X 2 to RCA 2001   Complete heart block (HCC)   Diabetes type 2,  Physician Discharge Summary  Nathan Miller POE:[id number] DOB: 25-May-1951 DOA: 03/30/2021  PCP: [facility]  Admit date: 03/30/2021 Discharge date: 04/08/2021  Admitted From: Home Disposition: Home  Recommendations for Outpatient Follow-up:  Follow up with PCP in 1-2 weeks Please obtain BMP/CBC in one week your next doctors visit.  PICC line placed, IV cefazolin for 2 weeks Follow-up outpatient ID Take Lasix 40 mg daily as needed for weight gain, shortness of breath, lower extremity swelling as directed.  Discharge Condition: Stable CODE STATUS: Full code Diet recommendation: Heart healthy  Brief/Interim Summary: 70 y.o. male with medical history significant for CAD, heart block s/p implanted pacemaker, DM, CHF who presents by EMS for evaluation of SOB with chest pain. He reports he laid down for a nap around 1 pm on 03/30/21 and when he woke up he had SOB, chills and chest pain. Claimed to have fevers prior to visit. Pt initially required 3LNC in ED, improved after one dose of IV lasix.  During hospital stay blood cultures became positive for strep bacteremia therefore started on IV Rocephin, ID consulted.  Echocardiogram did not show any vegetation therefore TEE 6/9 Negative for endocarditis, showed small PFO.  Repeat blood cultures remain negative, IV PICC line was ordered with anticipation of 2 weeks of IV cefazolin and follow-up with ID clinic in 2 weeks.  During the hospitalization, patient did not want me to speak with his family member as he stated he would update them on his own.  Today is medically stable to be discharged.     Assessment & Plan:   Principal Problem:   Acute respiratory failure with hypoxia (HCC) Active Problems:   Essential hypertension   CAD S/P percutaneous coronary angioplasty; bms X 2 to RCA 2001   Complete heart block (HCC)   Diabetes type 2, controlled (HCC)   Cardiac pacemaker in situ   Obesity with body mass index (BMI)  of 30.0 to 39.9   Elevated d-dimer   Elevated brain natriuretic peptide (BNP) level   Strep bacteremia with sepsis not present on admit -Sepsis not present on admission.  Cultures growing Streptococcus.  WBC improving - Echocardiogram, TEE = negative for endocarditis. - PICC line to be placed prior to discharge, 2 weeks of IV cefazolin and follow-up in ID clinic in 2 weeks.   Acute hypoxic respiratory failure secondary to CHF, improved Acute congestive heart failure with preserved ejection fraction, EF 55%, grade 2 DD, improved -Improved with IV diuretics. -CTA chest negative for PE - Echocardiogram- EF 55 to 60%, grade 2 DD - 40 mg Lasix daily as needed.  As needed instructions given    Essential hypertension - Resume home medications   Diabetes mellitus type 2 -A1c 6.4.  Resume home metformin   Elevated D-dimer -CTA chest negative for PE     Complete heart block History of CAD status post PCI 2001 -Pacemaker in place.  Chest pain-free - Lipitor 80 mg daily   ARF, improved -Renal function is now stable       Does have a full-thickness wound on the plantar surface of his foot without any obvious evidence of active infection.  Body mass index is 38.68 kg/m.         Discharge Diagnoses:  Principal Problem:   Acute respiratory failure with hypoxia ([place]) Active Problems:   Essential hypertension   CAD S/P percutaneous coronary angioplasty; bms X 2 to RCA 2001   Complete heart block (HCC)   Diabetes type 2,  identified in the pulmonary arteries to suggest acute pulmonary  embolism. Calcified coronary artery atherosclerosis (series 6, image 208). Borderline to mild cardiomegaly. No pericardial effusion. Calcified aortic atherosclerosis. Left subclavian approach cardiac pacemaker. Mediastinum/Nodes: Negative.  No mediastinal lymphadenopathy. Lungs/Pleura: Centrilobular emphysema. Narrow transverse and increased AP dimension of the trachea. Central peribronchial thickening. But the major airways remain patent. Chronic linear atelectasis in the lingula is stable since 2020. Similar mild linear scarring at the level of the left hilum affecting the upper lobe. Minimal linear scarring or atelectasis in the lateral segment of the right middle lobe and both costophrenic angles. No pleural effusion. Upper Abdomen: Mild reflux into the IVC and hepatic veins. Otherwise negative visible liver, gallbladder, spleen, pancreas, adrenal glands and bowel in the upper abdomen. Visible renal upper poles appears stable. 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 No acute osseous abnormality identified. Review of the MIP images confirms the above findings. IMPRESSION: 1. Negative for acute pulmonary embolus. 2. Chronic lung disease with Emphysema (ICD10-J43.9) and scattered linear lung scarring or atelectasis. 3. Calcified coronary artery and Aortic Atherosclerosis (ICD10-I70.0). Electronically Signed   By: Genevie Ann M.D.   On: 03/31/2021 04:07   DG Chest Port 1 View  Result Date: 03/30/2021 CLINICAL DATA:  Shortness of breath, chills EXAM: PORTABLE CHEST 1 VIEW COMPARISON:  10/30/2020 FINDINGS: Left pacer remains in place, unchanged. Heart is borderline in size. Mild peribronchial thickening. Increased markings at the lung bases, likely atelectasis or scarring, similar to prior study. No effusions. No acute bony abnormality. IMPRESSION: Mild bronchitic changes. Bibasilar atelectasis or scarring. Electronically Signed    By: Rolm Baptise M.D.   On: 03/30/2021 22:54   DG Foot Complete Right  Result Date: 04/05/2021 CLINICAL DATA:  Right heel diabetic wound for several months. EXAM: RIGHT FOOT COMPLETE - 3+ VIEW COMPARISON:  01/31/2017. FINDINGS: Previously demonstrated Charcot changes and changes of previous osteomyelitis with areas of bone fusion and severe irregularity and deformity. No areas of acute bone destruction or periosteal reaction seen. Diffuse soft tissue swelling without soft tissue gas. 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 Electronically Signed   By: Claudie Revering M.D.   On: 04/05/2021 20:35   ECHOCARDIOGRAM COMPLETE  Result Date: 03/31/2021    ECHOCARDIOGRAM REPORT   Patient Name:   Nathan Miller Date of Exam: 03/31/2021 Medical Rec #:  [id number]    Height:       69.0 in Accession #:    [id number]   Weight:       245.0 lb Date of Birth:  [birth year]   BSA:          2.252 m Patient Age:    70 years     BP:           123/61 mmHg Patient Gender: M            HR:           69 bpm. Exam Location:  Inpatient Procedure: 2D Echo Indications:    Dyspnea R06.00  History:        Patient has prior history of Echocardiogram examinations, most                 recent 01/23/2015. CAD, Signs/Symptoms:Syncope; Risk                 Factors:Hypertension,

## 2021-04-08 NOTE — Progress Notes (Signed)
Peripherally Inserted Central Catheter Placement  The IV Nurse has discussed with the patient and/or persons authorized to consent for the patient, the purpose of this procedure and the potential benefits and risks involved with this procedure.  The benefits include less needle sticks, lab draws from the catheter, and the patient may be discharged home with the catheter. Risks include, but not limited to, infection, bleeding, blood clot (thrombus formation), and puncture of an artery; nerve damage and irregular heartbeat and possibility to perform a PICC exchange if needed/ordered by physician.  Alternatives to this procedure were also discussed.  Bard Power PICC patient education guide, fact sheet on infection prevention and patient information card has been provided to patient /or left at bedside.    PICC Placement Documentation  PICC Single Lumen 70/17/79 Right Basilic 41 cm 0 cm (Active)  Indication for Insertion or Continuance of Line Home intravenous therapies (PICC only) 04/08/21 1705  Exposed Catheter (cm) 0 cm 04/08/21 1705  Site Assessment Clean;Dry;Intact 04/08/21 1705  Line Status Flushed;Blood return noted 04/08/21 1705  Dressing Type Transparent 04/08/21 1705  Dressing Status Clean;Dry;Intact 04/08/21 1705  Antimicrobial disc in place? Yes 04/08/21 1705  Dressing Change Due 04/15/21 04/08/21 1705       Scotty Court 04/08/2021, 5:19 PM

## 2021-04-08 NOTE — Progress Notes (Signed)
PHARMACY CONSULT NOTE FOR:  OUTPATIENT  PARENTERAL ANTIBIOTIC THERAPY (OPAT)  Indication: Streptococcus/Enterococcus bacteremia Regimen: Ampicillin 12 gm every 24 hours as a continuous infusion  End date: 04/20/21  IV antibiotic discharge orders are pended. To discharging provider:  please sign these orders via discharge navigator,  Select New Orders & click on the button choice - Manage This Unsigned Work.     Thank you for allowing pharmacy to be a part of this patient's care.  Jimmy Footman, PharmD, BCPS, BCIDP Infectious Diseases Clinical Pharmacist Phone: [phone] 04/08/2021, 8:46 AM

## 2021-04-08 NOTE — Evaluation (Signed)
Physical Therapy Evaluation Patient Details Name: Nathan Miller MRN: [id number] DOB: Jan 15, 1951 Today's Date: 04/08/2021   History of Present Illness  70 yo admitted 6/2 with CP and SOB with acute respiratory failure due to CHF exacerbation. Pt with strep bacteremia. PMxh: CAD, heart block s/p PPM, DM, CHF  Clinical Impression  Pt pleasant and reports he uses cane at all times at home with assist for lower body dressing from wife. Pt with Spo2 92-95% on RA throughout session with HR 62-68. Pt educated for recommendation for pulse ox for home use and continued monitoring as well as walking program but pt reports chronic back pain limits his function and gait at baseline. Pt with decreased endurance and strength who will benefit from acute therapy without need for intervention at home with pt aware and agreeable.      Follow Up Recommendations No PT follow up    Equipment Recommendations  None recommended by PT    Recommendations for Other Services       Precautions / Restrictions Precautions Precautions: Fall Required Braces or Orthoses: Other Brace Other Brace: AFO RLE Restrictions Weight Bearing Restrictions: No      Mobility  Bed Mobility               General bed mobility comments: pt denied bed mobility stating it is uncomfortable    Transfers Overall transfer level: Modified independent                  Ambulation/Gait Ambulation/Gait assistance: Modified independent (Device/Increase time) Gait Distance (Feet): 160 Feet Assistive device: Straight cane Gait Pattern/deviations: Step-through pattern;Decreased stride length   Gait velocity interpretation: 1.31 - 2.62 ft/sec, indicative of limited community ambulator General Gait Details: slow gait with good stability with cane with SpO2 92-95% on RA throughout  Stairs            Wheelchair Mobility    Modified Rankin (Stroke Patients Only)       Balance Overall balance assessment: Needs  assistance Sitting-balance support: No upper extremity supported Sitting balance-Leahy Scale: Fair     Standing balance support: Single extremity supported Standing balance-Leahy Scale: Poor Standing balance comment: single UE support in standing and gait                             Pertinent Vitals/Pain Pain Assessment: No/denies pain    Home Living Family/patient expects to be discharged to:: Private residence Living Arrangements: Spouse/significant other Available Help at Discharge: Family;Available 24 hours/day Type of Home: Mobile home Home Access: Stairs to enter Entrance Stairs-Rails: Right;Left Entrance Stairs-Number of Steps: 5   Home Equipment: Walker - 2 wheels;Cane - single point      Prior Function Level of Independence: Independent with assistive device(s);Needs assistance   Gait / Transfers Assistance Needed: uses cane  ADL's / Homemaking Assistance Needed: wife assist with socks/shoes and washing his back at times  Comments: driving, walks in the grocery stores, rides a cart in [employer]     Hand Dominance        Extremity/Trunk Assessment   Upper Extremity Assessment Upper Extremity Assessment: Generalized weakness    Lower Extremity Assessment Lower Extremity Assessment: Generalized weakness    Cervical / Trunk Assessment Cervical / Trunk Assessment: Kyphotic (forward head)  Communication   Communication: No difficulties  Cognition Arousal/Alertness: Awake/alert Behavior During Therapy: WFL for tasks assessed/performed Overall Cognitive Status: Within Functional Limits for tasks assessed  General Comments      Exercises     Assessment/Plan    PT Assessment Patient needs continued PT services  PT Problem List Decreased strength;Decreased mobility;Decreased safety awareness;Decreased activity tolerance;Decreased balance;Cardiopulmonary status limiting activity;Decreased  knowledge of use of DME       PT Treatment Interventions Gait training;Stair training;Functional mobility training;Therapeutic activities;Patient/family education;Balance training;Therapeutic exercise;DME instruction    PT Goals (Current goals can be found in the Care Plan section)  Acute Rehab PT Goals Patient Stated Goal: return home PT Goal Formulation: With patient Time For Goal Achievement: 04/22/21 Potential to Achieve Goals: Fair    Frequency Min 3X/week   Barriers to discharge        Co-evaluation               AM-PAC PT "6 Clicks" Mobility  Outcome Measure Help needed turning from your back to your side while in a flat bed without using bedrails?: A Little Help needed moving from lying on your back to sitting on the side of a flat bed without using bedrails?: A Little Help needed moving to and from a bed to a chair (including a wheelchair)?: None Help needed standing up from a chair using your arms (e.g., wheelchair or bedside chair)?: None Help needed to walk in hospital room?: A Little Help needed climbing 3-5 steps with a railing? : A Little 6 Click Score: 20    End of Session Equipment Utilized During Treatment: Gait belt Activity Tolerance: Patient tolerated treatment well Patient left: in chair;with call bell/phone within reach Nurse Communication: Mobility status PT Visit Diagnosis: Other abnormalities of gait and mobility (R26.89);Difficulty in walking, not elsewhere classified (R26.2);Muscle weakness (generalized) (M62.81)    Time: 9678-9381 PT Time Calculation (min) (ACUTE ONLY): 22 min   Charges:   PT Evaluation $PT Eval Moderate Complexity: 1 Mod          Jasper Ruminski P, PT Acute Rehabilitation Services Pager: [phone] Office: [phone]   Jayleene Glaeser B Wacey Zieger 04/08/2021, 11:59 AM

## 2021-04-08 NOTE — TOC Initial Note (Signed)
Transition of Care [facility]) - Initial/Assessment Note    Patient Details  Name: Nathan Miller MRN: [id number] Date of Birth: 06-05-1951  [facility] CM/SW Contact:    Nathan Frederick, LCSW Phone Number: 04/08/2021, 1:50 PM  Clinical Narrative:   CSW met with pt regarding HH recommendation.  Pt aware of IV abx plan and agreeable.  Choice document given, no preference indicate.  Permission given to speak with wife Nathan Miller.  PCP in place at [facility].  Pt currently has cane and walker in the home.  Pt reports he is vaccinated for covid but no booster.                  Expected Discharge Plan: Home w Home Health Services Barriers to Discharge: Other (must enter comment) (HH recommendation with HTA insurance)   Patient Goals and CMS Choice Patient states their goals for this hospitalization and ongoing recovery are:: "get better" CMS Medicare.gov Compare Post Acute Care list provided to:: Patient Choice offered to / list presented to : Patient  Expected Discharge Plan and Services Expected Discharge Plan: Home w Home Health Services     Post Acute Care Choice: Home Health Living arrangements for the past 2 months: Single Family Home Expected Discharge Date: 04/08/21               DME Arranged: N/A                    Prior Living Arrangements/Services Living arrangements for the past 2 months: Single Family Home Lives with:: Spouse Patient language and need for interpreter reviewed:: Yes Do you feel safe going back to the place where you live?: Yes      Need for Family Participation in Patient Care: No (Comment) Care giver support system in place?: Yes (comment) Current home services: Home RN Criminal Activity/Legal Involvement Pertinent to Current Situation/Hospitalization: No - Comment as needed  Activities of Daily Living Home Assistive Devices/Equipment: Brace (specify type), Eyeglasses, Cane (specify quad or straight), CBG Meter ADL Screening  (condition at time of admission) Patient's cognitive ability adequate to safely complete daily activities?: Yes Is the patient deaf or have difficulty hearing?: No Does the patient have difficulty seeing, even when wearing glasses/contacts?: No Does the patient have difficulty concentrating, remembering, or making decisions?: No Patient able to express need for assistance with ADLs?: Yes Does the patient have difficulty dressing or bathing?: Yes Independently performs ADLs?: Yes (appropriate for developmental age) Does the patient have difficulty walking or climbing stairs?: Yes Weakness of Legs: Both Weakness of Arms/Hands: Both  Permission Sought/Granted Permission sought to share information with : Family Supports Permission granted to share information with : Yes, Verbal Permission Granted  Share Information with NAME: wife Nathan Miller  Permission granted to share info w AGENCY: HH        Emotional Assessment Appearance:: Appears stated age Attitude/Demeanor/Rapport: Engaged Affect (typically observed): Appropriate, Pleasant Orientation: : Oriented to Self, Oriented to Place, Oriented to  Time, Oriented to Situation Alcohol / Substance Use: Not Applicable Psych Involvement: No (comment)  Admission diagnosis:  Elevated brain natriuretic peptide (BNP) level [R79.89] Acute respiratory failure with hypoxia (HCC) [J96.01] Patient Active Problem List   Diagnosis Date Noted   Acute respiratory failure with hypoxia (HCC) 03/31/2021   Obesity with body mass index (BMI) of 30.0 to 39.9 03/31/2021   Elevated d-dimer 03/31/2021   Elevated brain natriuretic peptide (BNP) level 03/31/2021   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  Umbilical hernia 01/29/2016   Obesity 01/29/2016   Marijuana use 01/29/2016   Diabetes type 2, controlled (HCC) 01/29/2016   Diverticulitis of intestine with perforation without bleeding    Blood poisoning    Diverticulitis of intestine with perforation  06/13/2015   Diverticulitis 06/13/2015   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   Faintness    Bradycardia 01/21/2015   Syncope 01/21/2015   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   Right foot ulcer (HCC) 01/21/2015   Diabetes mellitus type 2, uncontrolled (HCC) 01/21/2015   Hyperlipidemia LDL goal < 100 02/11/2013   Tobacco use disorder 02/11/2013   Anxiety state, unspecified 02/11/2013   Essential hypertension 05/10/2011   Papule 03/20/2011   Osteomyelitis (HCC)    CAD S/P percutaneous coronary angioplasty; bms X 2 to RCA 2001 01/22/2000    Class: Diagnosis of   PCP:  [facility]:   [facility] [place], [place] - [address] [address] Phone: [phone] Fax: [phone]  [facility] [place], [place] - [address] [place] [address] Phone: [phone] Fax: [phone]  [facility] [address], [place] - [place] [address] [place] [address] [place] [address] Phone: [phone] Fax: [phone]  [facility] [address] [place] [address] Phone: [phone] Fax: [phone]     Social Determinants of Health (SDOH) Interventions    Readmission Risk Interventions No flowsheet data found.

## 2021-04-08 NOTE — TOC Transition Note (Signed)
Transition of Care [facility]) - CM/SW Discharge Note   Patient Details  Name: Nathan Miller MRN: [id number] Date of Birth: 1951-05-31  [facility]) CM/SW Contact:  Joanne Chars, LCSW Phone Number: 04/08/2021, 2:58 PM   Clinical Narrative:   Pt discharging [facility] with [facility] .  Pt daughter will provide transportation home.  No other needs identified.      Final next level of care: [place] Barriers to Discharge: Barriers Resolved   Patient Goals and CMS Choice Patient states their goals for this hospitalization and ongoing recovery are:: "get better" CMS Medicare.gov Compare Post Acute Care list provided to:: Patient Choice offered to / list presented to : Patient  Discharge Placement                       Discharge Plan and Services     Post Acute Care Choice: Home Health          DME Arranged: N/A         HH Arranged: RN [place] Agency: [place] Date [facility] Agency Contacted: 04/08/21 Time HH Agency Contacted: 1000 Representative spoke with at [place]: Tommi Rumps contacted by Carolynn Sayers with [facility]  Social Determinants of Health (SDOH) Interventions     Readmission Risk Interventions No flowsheet data found.

## 2021-04-08 NOTE — Progress Notes (Signed)
[facility]  Date of Admission:  03/30/2021   Total days of antibiotics 5         ASSESSMENT: TEE shows no evidence of valvular vegetation or pacemaker lead vegetation.  For his Enterococcus bacteremia, will treat with 2 weeks of IV ampicillin.  PICC line ordered.  We will set up OPAT and follow-up appointment in 2 weeks.  End date: 04/20/2021  PLAN: 2 weeks of IV cefazolin outpatient. Follow-up in clinic in 2 weeks  Principal Problem:   Acute respiratory failure with hypoxia (HCC) Active Problems:   Essential hypertension   CAD S/P percutaneous coronary angioplasty; bms X 2 to RCA 2001   Complete heart block (HCC)   Diabetes type 2, controlled (HCC)   Cardiac pacemaker in situ   Obesity with body mass index (BMI) of 30.0 to 39.9   Elevated d-dimer   Elevated brain natriuretic peptide (BNP) level   Scheduled Meds:  aspirin EC  81 mg Oral Daily   atorvastatin  80 mg Oral Daily   enoxaparin (LOVENOX) injection  0.5 mg/kg Subcutaneous Q24H   furosemide  40 mg Intravenous Once   ibuprofen  600 mg Oral Once   insulin aspart  0-5 Units Subcutaneous QHS   insulin aspart  0-9 Units Subcutaneous TID WC   multivitamin with minerals  1 tablet Oral Daily   sodium chloride flush  3 mL Intravenous Q12H   Continuous Infusions:  sodium chloride     ampicillin (OMNIPEN) IV 2 g (04/08/21 0920)   PRN Meds:.sodium chloride, acetaminophen **OR** acetaminophen, albuterol, dextromethorphan-guaiFENesin, diphenhydrAMINE, EPINEPHrine, hydrALAZINE, ondansetron **OR** ondansetron (ZOFRAN) IV, polyethylene glycol, senna-docusate, sodium chloride flush   SUBJECTIVE: Patient is sitting on reclining chair during examination.  He appears comfortable and in no acute distress.  He denies any complaints and ready to go home.  Review of Systems: ROS Per HPI  No Known Allergies  OBJECTIVE: Vitals:   04/07/21 1536 04/07/21 2011 04/08/21 0022 04/08/21 0407  BP:   (!) 140/40 (!) 156/50 (!) 152/53  Pulse: (!) 51 (!) 50 (!) 51 (!) 50  Resp: (!) 24 (!) 23 (!) 21 20  Temp: 98 F (36.7 C) 98.3 F (36.8 C) 97.6 F (36.4 C) 98.2 F (36.8 C)  TempSrc: Oral Oral Oral Oral  SpO2: 95% 95% 97% 99%  Weight:      Height:       Body mass index is 38.68 kg/m.  Physical Exam Constitutional:      General: He is not in acute distress.    Appearance: He is obese. He is not ill-appearing.  HENT:     Head: Normocephalic.  Eyes:     General: No scleral icterus.       Right eye: No discharge.        Left eye: No discharge.     Conjunctiva/sclera: Conjunctivae normal.  Cardiovascular:     Rate and Rhythm: Normal rate and regular rhythm.     Heart sounds: No murmur heard. Pulmonary:     Effort: Pulmonary effort is normal. No respiratory distress.  Skin:    General: Skin is warm.     Coloration: Skin is not jaundiced.  Neurological:     Mental Status: He is alert. Mental status is at baseline.  Psychiatric:        Mood and Affect: Mood normal.        Thought Content: Thought content normal.  Judgment: Judgment normal.    Lab Results Lab Results  Component Value Date   WBC 10.1 04/08/2021   HGB 12.0 (L) 04/08/2021   HCT 37.2 (L) 04/08/2021   MCV 90.3 04/08/2021   PLT 245 04/08/2021    Lab Results  Component Value Date   CREATININE 0.76 04/08/2021   BUN 15 04/08/2021   NA 135 04/08/2021   K 4.6 04/08/2021   CL 92 (L) 04/08/2021   CO2 34 (H) 04/08/2021    Lab Results  Component Value Date   ALT 18 04/06/2021   AST 16 04/06/2021   ALKPHOS 74 04/06/2021   BILITOT 1.7 (H) 04/06/2021     Microbiology: Recent Results (from the past 240 hour(s))  Resp Panel by RT-PCR (Flu A&B, Covid) Nasopharyngeal Swab     Status: None   Collection Time: 03/30/21 10:39 PM   Specimen: Nasopharyngeal Swab; Nasopharyngeal(NP) swabs in vial transport medium  Result Value Ref Range Status   SARS Coronavirus 2 by RT PCR NEGATIVE NEGATIVE Final     Comment: (NOTE) SARS-CoV-2 target nucleic acids are NOT DETECTED.  The SARS-CoV-2 RNA is generally detectable in upper respiratory specimens during the acute phase of infection. The lowest concentration of SARS-CoV-2 viral copies this assay can detect is 138 copies/mL. A negative result does not preclude SARS-Cov-2 infection and should not be used as the sole basis for treatment or other patient management decisions. A negative result may occur with  improper specimen collection/handling, submission of specimen other than nasopharyngeal swab, presence of viral mutation(s) within the areas targeted by this assay, and inadequate number of viral copies(<138 copies/mL). A negative result must be combined with clinical observations, patient history, and epidemiological information. The expected result is Negative.  Fact Sheet for Patients:  EntrepreneurPulse.com.au  Fact Sheet for Healthcare Providers:  IncredibleEmployment.be  This test is no t yet approved or cleared by the [place] FDA and  has been authorized for detection and/or diagnosis of SARS-CoV-2 by FDA under an Emergency Use Authorization (EUA). This EUA will remain  in effect (meaning this test can be used) for the duration of the COVID-19 declaration under Section 564(b)(1) of the Act, 21 U.S.C.section 360bbb-3(b)(1), unless the authorization is terminated  or revoked sooner.       Influenza A by PCR NEGATIVE NEGATIVE Final   Influenza B by PCR NEGATIVE NEGATIVE Final    Comment: (NOTE) The Xpert Xpress SARS-CoV-2/FLU/RSV plus assay is intended as an aid in the diagnosis of influenza from Nasopharyngeal swab specimens and should not be used as a sole basis for treatment. Nasal washings and aspirates are unacceptable for Xpert Xpress SARS-CoV-2/FLU/RSV testing.  Fact Sheet for Patients: EntrepreneurPulse.com.au  Fact Sheet for Healthcare  Providers: IncredibleEmployment.be  This test is not yet approved or cleared by the [place] FDA and has been authorized for detection and/or diagnosis of SARS-CoV-2 by FDA under an Emergency Use Authorization (EUA). This EUA will remain in effect (meaning this test can be used) for the duration of the COVID-19 declaration under Section 564(b)(1) of the Act, 21 U.S.C. section 360bbb-3(b)(1), unless the authorization is terminated or revoked.  Performed at [facility], [place] [address], [place], [place] [address]   MRSA PCR Screening     Status: None   Collection Time: 03/31/21  3:00 PM   Specimen: Nasal Mucosa; Nasopharyngeal  Result Value Ref Range Status   MRSA by PCR NEGATIVE NEGATIVE Final    Comment:        The  GeneXpert MRSA Assay (FDA approved for NASAL specimens only), is one component of a comprehensive MRSA colonization surveillance program. It is not intended to diagnose MRSA infection nor to guide or monitor treatment for MRSA infections. Performed at [facility], [place] [address], [place], [place] [address]   Culture, blood (routine x 2)     Status: Abnormal   Collection Time: 04/04/21 12:15 AM   Specimen: BLOOD LEFT ARM  Result Value Ref Range Status   Specimen Description BLOOD LEFT ARM  Final   Special Requests   Final    BOTTLES DRAWN AEROBIC AND ANAEROBIC Blood Culture adequate volume   Culture  Setup Time   Final    GRAM POSITIVE COCCI IN CHAINS IN BOTH AEROBIC AND ANAEROBIC BOTTLES CRITICAL RESULT CALLED TO, READ BACK BY AND VERIFIED WITH: PHARMD ANDREW MEYERS 04/04/2021 AT 2050 A.HUGHES Performed at [facility], [place] [address] [place], [place] [address]    Culture (A)  Final    STREPTOCOCCUS ANGINOSIS SUSCEPTIBILITIES PERFORMED ON PREVIOUS CULTURE WITHIN THE LAST 5 DAYS. ENTEROCOCCUS FAECALIS    Report Status 04/07/2021 FINAL  Final   Organism ID, Bacteria ENTEROCOCCUS FAECALIS  Final      Susceptibility    Enterococcus faecalis - MIC*    AMPICILLIN <=2 SENSITIVE Sensitive     VANCOMYCIN 1 SENSITIVE Sensitive     GENTAMICIN SYNERGY RESISTANT Resistant     * ENTEROCOCCUS FAECALIS  Culture, blood (routine x 2)     Status: Abnormal   Collection Time: 04/04/21 12:17 AM   Specimen: BLOOD RIGHT WRIST  Result Value Ref Range Status   Specimen Description BLOOD RIGHT WRIST  Final   Special Requests   Final    BOTTLES DRAWN AEROBIC AND ANAEROBIC Blood Culture adequate volume   Culture  Setup Time   Final    GRAM POSITIVE COCCI IN CHAINS IN BOTH AEROBIC AND ANAEROBIC BOTTLES CRITICAL RESULT CALLED TO, READ BACK BY AND VERIFIED WITH: PHARMD ANDREW MEYER 04/04/2021 AT 2050 A.HUGHES Performed at [facility], [place] [address], [place], [place] [address]    Culture STREPTOCOCCUS ANGINOSIS (A)  Final   Report Status 04/06/2021 FINAL  Final   Organism ID, Bacteria STREPTOCOCCUS ANGINOSIS  Final      Susceptibility   Streptococcus anginosis - MIC*    PENICILLIN 0.12 SENSITIVE Sensitive     CEFTRIAXONE 0.5 SENSITIVE Sensitive     ERYTHROMYCIN <=0.12 SENSITIVE Sensitive     LEVOFLOXACIN 0.5 SENSITIVE Sensitive     VANCOMYCIN 0.5 SENSITIVE Sensitive     * STREPTOCOCCUS ANGINOSIS  Blood Culture ID Panel (Reflexed)     Status: Abnormal   Collection Time: 04/04/21 12:17 AM  Result Value Ref Range Status   Enterococcus faecalis NOT DETECTED NOT DETECTED Final   Enterococcus Faecium NOT DETECTED NOT DETECTED Final   Listeria monocytogenes NOT DETECTED NOT DETECTED Final   Staphylococcus species NOT DETECTED NOT DETECTED Final   Staphylococcus aureus (BCID) NOT DETECTED NOT DETECTED Final   Staphylococcus epidermidis NOT DETECTED NOT DETECTED Final   Staphylococcus lugdunensis NOT DETECTED NOT DETECTED Final   Streptococcus species DETECTED (A) NOT DETECTED Final    Comment: Not Enterococcus species, Streptococcus agalactiae, Streptococcus pyogenes, or Streptococcus pneumoniae. CRITICAL RESULT  CALLED TO, READ BACK BY AND VERIFIED WITH: PHARMD ANDREW MEYER 04/04/2021 AT 2250 A.HUGHES    Streptococcus agalactiae NOT DETECTED NOT DETECTED Final   Streptococcus pneumoniae NOT DETECTED NOT DETECTED Final   Streptococcus pyogenes NOT DETECTED NOT DETECTED  Final   A.calcoaceticus-baumannii NOT DETECTED NOT DETECTED Final   Bacteroides fragilis NOT DETECTED NOT DETECTED Final   Enterobacterales NOT DETECTED NOT DETECTED Final   Enterobacter cloacae complex NOT DETECTED NOT DETECTED Final   Escherichia coli NOT DETECTED NOT DETECTED Final   Klebsiella aerogenes NOT DETECTED NOT DETECTED Final   Klebsiella oxytoca NOT DETECTED NOT DETECTED Final   Klebsiella pneumoniae NOT DETECTED NOT DETECTED Final   Proteus species NOT DETECTED NOT DETECTED Final   Salmonella species NOT DETECTED NOT DETECTED Final   Serratia marcescens NOT DETECTED NOT DETECTED Final   Haemophilus influenzae NOT DETECTED NOT DETECTED Final   Neisseria meningitidis NOT DETECTED NOT DETECTED Final   Pseudomonas aeruginosa NOT DETECTED NOT DETECTED Final   Stenotrophomonas maltophilia NOT DETECTED NOT DETECTED Final   Candida albicans NOT DETECTED NOT DETECTED Final   Candida auris NOT DETECTED NOT DETECTED Final   Candida glabrata NOT DETECTED NOT DETECTED Final   Candida krusei NOT DETECTED NOT DETECTED Final   Candida parapsilosis NOT DETECTED NOT DETECTED Final   Candida tropicalis NOT DETECTED NOT DETECTED Final   Cryptococcus neoformans/gattii NOT DETECTED NOT DETECTED Final    Comment: Performed at [facility], [place]. [address], [place], [place] [address]  Urine Culture     Status: Abnormal   Collection Time: 04/04/21 12:24 PM   Specimen: Urine, Random  Result Value Ref Range Status   Specimen Description URINE, RANDOM  Final   Special Requests   Final    NONE Performed at [facility], [place] [address], [place], [place] [address]    Culture MULTIPLE SPECIES PRESENT, SUGGEST RECOLLECTION (A)   Final   Report Status 04/05/2021 FINAL  Final  Culture, blood (Routine X 2) w Reflex to ID Panel     Status: None (Preliminary result)   Collection Time: 04/06/21  8:41 AM   Specimen: BLOOD  Result Value Ref Range Status   Specimen Description BLOOD RIGHT ANTECUBITAL  Final   Special Requests   Final    BOTTLES DRAWN AEROBIC AND ANAEROBIC Blood Culture adequate volume   Culture   Final    NO GROWTH 2 DAYS Performed at [facility], [place] [address] [place], [place] [address]    Report Status PENDING  Incomplete  Culture, blood (Routine X 2) w Reflex to ID Panel     Status: None (Preliminary result)   Collection Time: 04/06/21  8:48 AM   Specimen: BLOOD LEFT HAND  Result Value Ref Range Status   Specimen Description BLOOD LEFT HAND  Final   Special Requests   Final    BOTTLES DRAWN AEROBIC ONLY Blood Culture adequate volume   Culture   Final    NO GROWTH 2 DAYS Performed at [facility], [place] [address], [place], [place] [address]    Report Status PENDING  Incomplete    Gaylan Gerold, [facility] [phone] pager   [phone] cell 04/08/2021, 10:32 AM

## 2021-04-08 NOTE — Progress Notes (Signed)
Discharge teaching completed with patient, wife and daughter. Verbalized understanding of medication regimen, home health care and follow up appointments. PIV removed. PICC in place and verified by xray. OK to use per IV team.  Currently receiving teaching and getting home antibiotic.  Will be taken to DC area via wheelchair by tech.

## 2021-04-09 DIAGNOSIS — Z7982 Long term (current) use of aspirin: Secondary | ICD-10-CM | POA: Diagnosis not present

## 2021-04-09 DIAGNOSIS — I5030 Unspecified diastolic (congestive) heart failure: Secondary | ICD-10-CM | POA: Diagnosis not present

## 2021-04-09 DIAGNOSIS — M479 Spondylosis, unspecified: Secondary | ICD-10-CM | POA: Diagnosis not present

## 2021-04-09 DIAGNOSIS — J441 Chronic obstructive pulmonary disease with (acute) exacerbation: Secondary | ICD-10-CM | POA: Diagnosis not present

## 2021-04-09 DIAGNOSIS — E669 Obesity, unspecified: Secondary | ICD-10-CM | POA: Diagnosis not present

## 2021-04-09 DIAGNOSIS — Z87891 Personal history of nicotine dependence: Secondary | ICD-10-CM | POA: Diagnosis not present

## 2021-04-09 DIAGNOSIS — R001 Bradycardia, unspecified: Secondary | ICD-10-CM | POA: Diagnosis not present

## 2021-04-09 DIAGNOSIS — J9811 Atelectasis: Secondary | ICD-10-CM | POA: Diagnosis not present

## 2021-04-09 DIAGNOSIS — Z955 Presence of coronary angioplasty implant and graft: Secondary | ICD-10-CM | POA: Diagnosis not present

## 2021-04-09 DIAGNOSIS — M170 Bilateral primary osteoarthritis of knee: Secondary | ICD-10-CM | POA: Diagnosis not present

## 2021-04-09 DIAGNOSIS — Z792 Long term (current) use of antibiotics: Secondary | ICD-10-CM | POA: Diagnosis not present

## 2021-04-09 DIAGNOSIS — I110 Hypertensive heart disease with heart failure: Secondary | ICD-10-CM | POA: Diagnosis not present

## 2021-04-09 DIAGNOSIS — E785 Hyperlipidemia, unspecified: Secondary | ICD-10-CM | POA: Diagnosis not present

## 2021-04-09 DIAGNOSIS — I442 Atrioventricular block, complete: Secondary | ICD-10-CM | POA: Diagnosis not present

## 2021-04-09 DIAGNOSIS — E11610 Type 2 diabetes mellitus with diabetic neuropathic arthropathy: Secondary | ICD-10-CM | POA: Diagnosis not present

## 2021-04-09 DIAGNOSIS — I2510 Atherosclerotic heart disease of native coronary artery without angina pectoris: Secondary | ICD-10-CM | POA: Diagnosis not present

## 2021-04-09 DIAGNOSIS — Z7984 Long term (current) use of oral hypoglycemic drugs: Secondary | ICD-10-CM | POA: Diagnosis not present

## 2021-04-09 DIAGNOSIS — N179 Acute kidney failure, unspecified: Secondary | ICD-10-CM | POA: Diagnosis not present

## 2021-04-09 DIAGNOSIS — I700 Atherosclerosis of aorta: Secondary | ICD-10-CM | POA: Diagnosis not present

## 2021-04-09 DIAGNOSIS — J9601 Acute respiratory failure with hypoxia: Secondary | ICD-10-CM | POA: Diagnosis not present

## 2021-04-09 DIAGNOSIS — A409 Streptococcal sepsis, unspecified: Secondary | ICD-10-CM | POA: Diagnosis not present

## 2021-04-09 DIAGNOSIS — Z6839 Body mass index (BMI) 39.0-39.9, adult: Secondary | ICD-10-CM | POA: Diagnosis not present

## 2021-04-09 DIAGNOSIS — Z452 Encounter for adjustment and management of vascular access device: Secondary | ICD-10-CM | POA: Diagnosis not present

## 2021-04-09 DIAGNOSIS — Z9181 History of falling: Secondary | ICD-10-CM | POA: Diagnosis not present

## 2021-04-09 DIAGNOSIS — Z950 Presence of cardiac pacemaker: Secondary | ICD-10-CM | POA: Diagnosis not present

## 2021-04-11 LAB — CULTURE, BLOOD (ROUTINE X 2)
Culture: NO GROWTH
Culture: NO GROWTH
Special Requests: ADEQUATE
Special Requests: ADEQUATE

## 2021-04-12 ENCOUNTER — Other Ambulatory Visit (HOSPITAL_COMMUNITY)
Admission: RE | Admit: 2021-04-12 | Discharge: 2021-04-12 | Disposition: A | Discharge status: Home / Self Care | Payer: PPO | Source: Other Acute Inpatient Hospital | Attending: Internal Medicine | Admitting: Internal Medicine

## 2021-04-12 DIAGNOSIS — Z5181 Encounter for therapeutic drug level monitoring: Secondary | ICD-10-CM | POA: Diagnosis not present

## 2021-04-12 DIAGNOSIS — M869 Osteomyelitis, unspecified: Secondary | ICD-10-CM | POA: Diagnosis not present

## 2021-04-12 DIAGNOSIS — E08621 Diabetes mellitus due to underlying condition with foot ulcer: Secondary | ICD-10-CM | POA: Diagnosis not present

## 2021-04-12 DIAGNOSIS — R7881 Bacteremia: Secondary | ICD-10-CM | POA: Diagnosis not present

## 2021-04-12 LAB — CBC WITH DIFFERENTIAL/PLATELET
Abs Immature Granulocytes: 0.12 10*3/uL — ABNORMAL HIGH (ref 0.00–0.07)
Basophils Absolute: 0.1 10*3/uL (ref 0.0–0.1)
Basophils Relative: 1 %
Eosinophils Absolute: 0.4 10*3/uL (ref 0.0–0.5)
Eosinophils Relative: 4 %
HCT: 35.9 % — ABNORMAL LOW (ref 39.0–52.0)
Hemoglobin: 11.4 g/dL — ABNORMAL LOW (ref 13.0–17.0)
Immature Granulocytes: 1 %
Lymphocytes Relative: 21 %
Lymphs Abs: 2 10*3/uL (ref 0.7–4.0)
MCH: 29.8 pg (ref 26.0–34.0)
MCHC: 31.8 g/dL (ref 30.0–36.0)
MCV: 94 fL (ref 80.0–100.0)
Monocytes Absolute: 1.1 10*3/uL — ABNORMAL HIGH (ref 0.1–1.0)
Monocytes Relative: 11 %
Neutro Abs: 5.7 10*3/uL (ref 1.7–7.7)
Neutrophils Relative %: 62 %
Platelets: 316 10*3/uL (ref 150–400)
RBC: 3.82 MIL/uL — ABNORMAL LOW (ref 4.22–5.81)
RDW: 14.2 % (ref 11.5–15.5)
WBC: 9.3 10*3/uL (ref 4.0–10.5)
nRBC: 0 % (ref 0.0–0.2)

## 2021-04-12 LAB — SEDIMENTATION RATE: Sed Rate: 55 mm/hr — ABNORMAL HIGH (ref 0–16)

## 2021-04-12 LAB — BASIC METABOLIC PANEL
Anion gap: 7 (ref 5–15)
BUN: 9 mg/dL (ref 8–23)
CO2: 32 mmol/L (ref 22–32)
Calcium: 7.9 mg/dL — ABNORMAL LOW (ref 8.9–10.3)
Chloride: 95 mmol/L — ABNORMAL LOW (ref 98–111)
Creatinine, Ser: 0.72 mg/dL (ref 0.61–1.24)
GFR, Estimated: >60 mL/min (ref >60)
Glucose, Bld: 141 mg/dL — ABNORMAL HIGH (ref 70–99)
Potassium: 3.9 mmol/L (ref 3.5–5.1)
Sodium: 134 mmol/L — ABNORMAL LOW (ref 135–145)

## 2021-04-12 LAB — C-REACTIVE PROTEIN: CRP: 2.6 mg/dL — ABNORMAL HIGH (ref <1.0)

## 2021-04-13 DIAGNOSIS — R7881 Bacteremia: Secondary | ICD-10-CM | POA: Diagnosis not present

## 2021-04-13 DIAGNOSIS — M869 Osteomyelitis, unspecified: Secondary | ICD-10-CM | POA: Diagnosis not present

## 2021-04-13 DIAGNOSIS — E08621 Diabetes mellitus due to underlying condition with foot ulcer: Secondary | ICD-10-CM | POA: Diagnosis not present

## 2021-04-14 DIAGNOSIS — A491 Streptococcal infection, unspecified site: Secondary | ICD-10-CM | POA: Diagnosis not present

## 2021-04-14 DIAGNOSIS — B952 Enterococcus as the cause of diseases classified elsewhere: Secondary | ICD-10-CM | POA: Diagnosis not present

## 2021-04-14 DIAGNOSIS — R7881 Bacteremia: Secondary | ICD-10-CM | POA: Diagnosis not present

## 2021-04-16 DIAGNOSIS — B952 Enterococcus as the cause of diseases classified elsewhere: Secondary | ICD-10-CM | POA: Diagnosis not present

## 2021-04-16 DIAGNOSIS — R7881 Bacteremia: Secondary | ICD-10-CM | POA: Diagnosis not present

## 2021-04-16 DIAGNOSIS — A491 Streptococcal infection, unspecified site: Secondary | ICD-10-CM | POA: Diagnosis not present

## 2021-04-17 ENCOUNTER — Telehealth: Payer: Self-pay | Admitting: Physician Assistant

## 2021-04-17 DIAGNOSIS — M479 Spondylosis, unspecified: Secondary | ICD-10-CM | POA: Diagnosis not present

## 2021-04-17 DIAGNOSIS — A409 Streptococcal sepsis, unspecified: Secondary | ICD-10-CM | POA: Diagnosis not present

## 2021-04-17 DIAGNOSIS — I110 Hypertensive heart disease with heart failure: Secondary | ICD-10-CM | POA: Diagnosis not present

## 2021-04-17 DIAGNOSIS — I442 Atrioventricular block, complete: Secondary | ICD-10-CM | POA: Diagnosis not present

## 2021-04-17 DIAGNOSIS — J9601 Acute respiratory failure with hypoxia: Secondary | ICD-10-CM | POA: Diagnosis not present

## 2021-04-17 DIAGNOSIS — E785 Hyperlipidemia, unspecified: Secondary | ICD-10-CM | POA: Diagnosis not present

## 2021-04-17 DIAGNOSIS — I5030 Unspecified diastolic (congestive) heart failure: Secondary | ICD-10-CM | POA: Diagnosis not present

## 2021-04-17 DIAGNOSIS — Z87891 Personal history of nicotine dependence: Secondary | ICD-10-CM | POA: Diagnosis not present

## 2021-04-17 DIAGNOSIS — Z9181 History of falling: Secondary | ICD-10-CM | POA: Diagnosis not present

## 2021-04-17 DIAGNOSIS — I2510 Atherosclerotic heart disease of native coronary artery without angina pectoris: Secondary | ICD-10-CM | POA: Diagnosis not present

## 2021-04-17 DIAGNOSIS — J9811 Atelectasis: Secondary | ICD-10-CM | POA: Diagnosis not present

## 2021-04-17 DIAGNOSIS — R001 Bradycardia, unspecified: Secondary | ICD-10-CM | POA: Diagnosis not present

## 2021-04-17 DIAGNOSIS — N179 Acute kidney failure, unspecified: Secondary | ICD-10-CM | POA: Diagnosis not present

## 2021-04-17 DIAGNOSIS — Z950 Presence of cardiac pacemaker: Secondary | ICD-10-CM | POA: Diagnosis not present

## 2021-04-17 DIAGNOSIS — Z6839 Body mass index (BMI) 39.0-39.9, adult: Secondary | ICD-10-CM | POA: Diagnosis not present

## 2021-04-17 DIAGNOSIS — Z7984 Long term (current) use of oral hypoglycemic drugs: Secondary | ICD-10-CM | POA: Diagnosis not present

## 2021-04-17 DIAGNOSIS — E669 Obesity, unspecified: Secondary | ICD-10-CM | POA: Diagnosis not present

## 2021-04-17 DIAGNOSIS — Z452 Encounter for adjustment and management of vascular access device: Secondary | ICD-10-CM | POA: Diagnosis not present

## 2021-04-17 DIAGNOSIS — J441 Chronic obstructive pulmonary disease with (acute) exacerbation: Secondary | ICD-10-CM | POA: Diagnosis not present

## 2021-04-17 DIAGNOSIS — I700 Atherosclerosis of aorta: Secondary | ICD-10-CM | POA: Diagnosis not present

## 2021-04-17 DIAGNOSIS — Z955 Presence of coronary angioplasty implant and graft: Secondary | ICD-10-CM | POA: Diagnosis not present

## 2021-04-17 DIAGNOSIS — M170 Bilateral primary osteoarthritis of knee: Secondary | ICD-10-CM | POA: Diagnosis not present

## 2021-04-17 DIAGNOSIS — E11610 Type 2 diabetes mellitus with diabetic neuropathic arthropathy: Secondary | ICD-10-CM | POA: Diagnosis not present

## 2021-04-17 DIAGNOSIS — Z7982 Long term (current) use of aspirin: Secondary | ICD-10-CM | POA: Diagnosis not present

## 2021-04-17 DIAGNOSIS — Z792 Long term (current) use of antibiotics: Secondary | ICD-10-CM | POA: Diagnosis not present

## 2021-04-17 NOTE — Telephone Encounter (Signed)
Patient recently admitted with bacteremia, no sign of vegetation on TEE on his valve or pacemaker lead. He was discharged on PICC line and IV abx. Home health nurse angel went to his home today and noted his weight is up by 15 lbs by their scale, but patient denies any significant SOB, he does have a dry hacky cough. Today's weight is 255 lbs. Looking at his recent admission weight, he was 261 lbs. Therefore although his weight was reportedly higher by home health nurse's scale, it is actually lower when compare to the hospital weight. Patient does have bilateral LE edema, therefore I am fine with a single dose of oral lasix. Otherwise, will keep lasix as PRN for now for weight gain and LE edema.    [facility] health nurse [phone]

## 2021-04-19 DIAGNOSIS — A409 Streptococcal sepsis, unspecified: Secondary | ICD-10-CM | POA: Diagnosis not present

## 2021-04-19 DIAGNOSIS — M179 Osteoarthritis of knee, unspecified: Secondary | ICD-10-CM | POA: Diagnosis not present

## 2021-04-19 DIAGNOSIS — R7881 Bacteremia: Secondary | ICD-10-CM | POA: Diagnosis not present

## 2021-04-19 DIAGNOSIS — A491 Streptococcal infection, unspecified site: Secondary | ICD-10-CM | POA: Diagnosis not present

## 2021-04-19 DIAGNOSIS — B952 Enterococcus as the cause of diseases classified elsewhere: Secondary | ICD-10-CM | POA: Diagnosis not present

## 2021-04-20 ENCOUNTER — Ambulatory Visit: Payer: PPO | Admitting: Family

## 2021-04-20 ENCOUNTER — Encounter: Payer: Self-pay | Admitting: Family

## 2021-04-20 ENCOUNTER — Other Ambulatory Visit: Payer: Self-pay

## 2021-04-20 VITALS — BP 183/94 | HR 80 | Temp 97.2°F | Resp 16 | Ht 69.0 in | Wt 261.0 lb

## 2021-04-20 DIAGNOSIS — R7881 Bacteremia: Secondary | ICD-10-CM | POA: Diagnosis not present

## 2021-04-20 DIAGNOSIS — A409 Streptococcal sepsis, unspecified: Secondary | ICD-10-CM | POA: Diagnosis not present

## 2021-04-20 DIAGNOSIS — I110 Hypertensive heart disease with heart failure: Secondary | ICD-10-CM | POA: Diagnosis not present

## 2021-04-20 DIAGNOSIS — Z452 Encounter for adjustment and management of vascular access device: Secondary | ICD-10-CM | POA: Diagnosis not present

## 2021-04-20 DIAGNOSIS — J441 Chronic obstructive pulmonary disease with (acute) exacerbation: Secondary | ICD-10-CM | POA: Diagnosis not present

## 2021-04-20 DIAGNOSIS — I5030 Unspecified diastolic (congestive) heart failure: Secondary | ICD-10-CM | POA: Diagnosis not present

## 2021-04-20 DIAGNOSIS — J9601 Acute respiratory failure with hypoxia: Secondary | ICD-10-CM | POA: Diagnosis not present

## 2021-04-20 MED ORDER — AMOXICILLIN-POT CLAVULANATE 875-125 MG PO TABS: 1.0000 | ORAL_TABLET | Freq: Two times a day (BID) | ORAL | 0 refills | Status: AC

## 2021-04-20 NOTE — Patient Instructions (Signed)
Nice to see you.  We will get your PICC line removed.  Start taking Augmentin tomorrow for 2 weeks.  Plan for follow up in 1 month to recheck blood cultures.  Have a great day and stay safe!

## 2021-04-20 NOTE — Progress Notes (Signed)
Remote pacemaker transmission.   

## 2021-04-20 NOTE — Assessment & Plan Note (Signed)
Nathan Miller is a 70 year old Caucasian gentleman with polymicrobial bacteremia with Streptococcus anginosus and Enterococcus faecalis with no clear source of infection indicating likely simple bacteremia.  Has completed 2 weeks and is feeling well.  Discontinue PICC line and will change to Augmentin for additional 2 weeks and have him follow-up in 1 month for surveillance blood cultures.

## 2021-04-20 NOTE — Progress Notes (Signed)
Subjective:    Patient ID: Nathan Miller, male    DOB: Sep 13, 1951, 70 y.o.   MRN: [id number]  Chief Complaint  Patient presents with   Bacteremia     HPI:  Nathan Miller is a 70 y.o. male with previous medical history of coronary artery disease, heart block status post pacemaker insertion, and type 2 diabetes presenting today for hospitalization follow-up.  Nathan Miller was recently admitted to the hospital with acute onset chest pain and shortness of breath with concern for acute diastolic heart failure exacerbation.  Found to have fevers and blood cultures noted to be positive for Streptococcus anginosus and Enterococcus faecalis bacteremia.  Source of infection was unclear.  Penicillin allergy tested with concern for previous history of allergy determined to be unfounded.  TEE without evidence of endocarditis or pacemaker infection.  Started on ampicillin with goal of treatment of 2 weeks via PICC line.  End date planned for 04/20/2021.  Nathan Miller has been receiving his continuous ampicillin as prescribed with no adverse side effects or missed doses since leaving the hospital.  Feeling well today with no new concerns/complaints.  Eager to have his PICC line removed.  PICC line functioning appropriately both infusing medication and drawing of blood.  Dressing is clean, dry, and intact.  No Known Allergies    Outpatient Medications Prior to Visit  Medication Sig Dispense Refill   ampicillin IVPB Inject 12 g into the vein daily for 12 days. As a continuous infusion . Indication: Streptococcus/enterococcus bacteremia First Dose: Yes Last Day of Therapy:  04/20/21 Labs - Once weekly:  CBC/D and BMP, Labs - Every other week:  ESR and CRP Method of administration: Ambulatory Pump (Continuous Infusion) Method of administration may be changed at the discretion of home infusion pharmacist based upon assessment of the patient and/or caregiver's ability to self-administer the medication ordered. 12  Units 0   aspirin EC 81 MG tablet Take 1 tablet (81 mg total) by mouth daily. 30 tablet 0   atorvastatin (LIPITOR) 80 MG tablet Take 1 tablet (80 mg total) by mouth daily. 90 tablet 3   carvedilol (COREG) 25 MG tablet Take 1 tablet (25 mg total) by mouth 2 (two) times daily. 180 tablet 3   furosemide (LASIX) 40 MG tablet Take 1 tablet (40 mg total) by mouth daily as needed for fluid or edema (If weight gain >3-5 pounds in 24hrs.). 30 tablet 0   lisinopril (ZESTRIL) 40 MG tablet Take 1 tablet (40 mg total) by mouth daily. 30 tablet 11   metFORMIN (GLUCOPHAGE) 500 MG tablet Take 1 tablet (500 mg total) by mouth 2 (two) times daily with a meal. 60 tablet 0   Multiple Vitamins-Minerals (CENTRUM ADULTS) TABS Take 1 tablet by mouth daily.     nitroGLYCERIN (NITROSTAT) 0.4 MG SL tablet Place 1 tablet (0.4 mg total) under the tongue every 5 (five) minutes as needed for chest pain. 20 tablet 0   No facility-administered medications prior to visit.     Past Medical History:  Diagnosis Date   CAD (coronary artery disease)    NSTEMI in 2001. Cardiac cath showed: LAD: 40% proximal, LCX: 70% mid, RCA thrombotic occlusion in mid segment. s/p PCI and 2 overlapped BMSs to RCA.    CHF (congestive heart failure) (HCC)    Diabetes mellitus    Hypertension    Osteomyelitis (HCC)    Presence of permanent cardiac pacemaker      Past Surgical History:  Procedure Laterality Date  BUBBLE STUDY  04/07/2021   Procedure: BUBBLE STUDY;  Surgeon: Parke Poisson, MD;  Location: [facility] ENDOSCOPY;  Service: Cardiology;;   CARDIAC CATHETERIZATION     CORONARY ANGIOPLASTY     PERMANENT PACEMAKER INSERTION N/A 01/22/2015   MDT Advisa pacemaker implanted for complete heart block by Dr Johney Frame   TEE WITHOUT CARDIOVERSION N/A 04/07/2021   Procedure: TRANSESOPHAGEAL ECHOCARDIOGRAM (TEE);  Surgeon: Parke Poisson, MD;  Location: [facility] ENDOSCOPY;  Service: Cardiology;  Laterality: N/A;   TEMPORARY PACEMAKER INSERTION N/A  01/21/2015   Procedure: TEMPORARY PACEMAKER INSERTION;  Surgeon: Marykay Lex, MD;  Location[facility] CATH LAB;  Service: Cardiovascular;  Laterality: N/A;       Review of Systems  Constitutional:  Negative for chills, diaphoresis, fatigue and fever.  Respiratory:  Negative for cough, chest tightness, shortness of breath and wheezing.   Cardiovascular:  Negative for chest pain.  Gastrointestinal:  Negative for abdominal pain, diarrhea, nausea and vomiting.     Objective:    BP (!) 183/94   Pulse 80   Temp (!) 97.2 F (36.2 C) (Temporal)   Resp 16   Ht 5\' 9"  (1.753 m)   Wt 261 lb (118.4 kg)   SpO2 97%   BMI 38.54 kg/m  Nursing note and vital signs reviewed.  Physical Exam Constitutional:      General: He is not in acute distress.    Appearance: He is well-developed. He is obese.  Cardiovascular:     Rate and Rhythm: Normal rate and regular rhythm.     Heart sounds: Normal heart sounds.     Comments: PICC line in right upper extremity infusing.  Dressing is clean, dry, and intact.  No evidence of infection. Pulmonary:     Effort: Pulmonary effort is normal.     Breath sounds: Normal breath sounds.  Skin:    General: Skin is warm and dry.  Neurological:     Mental Status: He is alert and oriented to person, place, and time.  Psychiatric:        Behavior: Behavior normal.        Thought Content: Thought content normal.        Judgment: Judgment normal.     Depression screen [facility] 2/9 02/11/2013 05/10/2011 03/20/2011  Decreased Interest 0 0 1  Down, Depressed, Hopeless 0 1 1  PHQ - 2 Score 0 1 2       Assessment & Plan:    Patient Active Problem List   Diagnosis Date Noted   Bacteremia 04/20/2021   Acute respiratory failure with hypoxia (HCC) 03/31/2021   Obesity with body mass index (BMI) of 30.0 to 39.9 03/31/2021   Elevated d-dimer 03/31/2021   Elevated brain natriuretic peptide (BNP) level 03/31/2021   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   Umbilical hernia  01/29/2016   Obesity 01/29/2016   Marijuana use 01/29/2016   Diabetes type 2, controlled (HCC) 01/29/2016   Diverticulitis of intestine with perforation without bleeding    Blood poisoning    Diverticulitis of intestine with perforation 06/13/2015   Diverticulitis 06/13/2015   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   Faintness    Bradycardia 01/21/2015   Syncope 01/21/2015   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   Right foot ulcer (HCC) 01/21/2015   Diabetes mellitus type 2, uncontrolled (HCC) 01/21/2015   Hyperlipidemia LDL goal < 100 02/11/2013   Tobacco use disorder 02/11/2013   Anxiety state, unspecified 02/11/2013   Essential hypertension 05/10/2011   Papule 03/20/2011  Osteomyelitis (HCC)    CAD S/P percutaneous coronary angioplasty; bms X 2 to RCA 2001 01/22/2000    Class: Diagnosis of     Problem List Items Addressed This Visit       Other   Bacteremia - Primary    Nathan Miller is a 70 year old Caucasian gentleman with polymicrobial bacteremia with Streptococcus anginosus and Enterococcus faecalis with no clear source of infection indicating likely simple bacteremia.  Has completed 2 weeks and is feeling well.  Discontinue PICC line and will change to Augmentin for additional 2 weeks and have him follow-up in 1 month for surveillance blood cultures.         I am having Nathan Miller. Alona Bene start on amoxicillin-clavulanate. I am also having him maintain his metFORMIN, aspirin EC, nitroGLYCERIN, carvedilol, atorvastatin, Centrum Adults, lisinopril, ampicillin, and furosemide.   Meds ordered this encounter  Medications   amoxicillin-clavulanate (AUGMENTIN) 875-125 MG tablet    Sig: Take 1 tablet by mouth 2 (two) times daily for 14 days.    Dispense:  28 tablet    Refill:  0    Order Specific Question:   Supervising Provider    Answer:   Judyann Munson [[id number]]     Follow-up: Return in about 1 month (around 05/20/2021), or if symptoms worsen or fail to  improve.   Marcos Eke, MSN, FNP-C Nurse Practitioner [facility] Main number: [phone]

## 2021-05-18 ENCOUNTER — Ambulatory Visit: Payer: PPO | Admitting: Family

## 2021-06-03 ENCOUNTER — Ambulatory Visit: Payer: PPO | Admitting: Internal Medicine

## 2021-06-03 ENCOUNTER — Other Ambulatory Visit: Payer: Self-pay

## 2021-06-03 VITALS — BP 156/90 | HR 61 | Ht 69.0 in | Wt 253.0 lb

## 2021-06-03 DIAGNOSIS — I10 Essential (primary) hypertension: Secondary | ICD-10-CM | POA: Diagnosis not present

## 2021-06-03 DIAGNOSIS — I441 Atrioventricular block, second degree: Secondary | ICD-10-CM

## 2021-06-03 DIAGNOSIS — I442 Atrioventricular block, complete: Secondary | ICD-10-CM | POA: Diagnosis not present

## 2021-06-03 MED ORDER — NITROGLYCERIN 0.4 MG SL SUBL: 0.4000 mg | SUBLINGUAL_TABLET | SUBLINGUAL | 3 refills | Status: DC | PRN

## 2021-06-03 NOTE — Progress Notes (Signed)
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  Since last being seen in our clinic, the patient reports doing reasoanbly well.  He was hospitalized in June with sepsis/ bacteremia.  Today, he denies symptoms of palpitations, chest pain, shortness of breath,  lower extremity edema, dizziness, presyncope, or syncope.  The patient is otherwise without complaint today.   Past Medical History:  Diagnosis Date   CAD (coronary artery disease)    NSTEMI in 2001. Cardiac cath showed: LAD: 40% proximal, LCX: 70% mid, RCA thrombotic occlusion in mid segment. s/p PCI and 2 overlapped BMSs to RCA.    CHF (congestive heart failure) (HCC)    Diabetes mellitus    Hypertension    Osteomyelitis ([place])    Presence of permanent cardiac pacemaker    Past Surgical History:  Procedure Laterality Date   BUBBLE STUDY  04/07/2021   Procedure: BUBBLE STUDY;  Surgeon: Elouise Munroe, MD;  Location: [facility];  Service: Cardiology;;   CARDIAC CATHETERIZATION     CORONARY ANGIOPLASTY     PERMANENT PACEMAKER INSERTION N/A 01/22/2015   MDT Advisa pacemaker implanted for complete heart block by Dr Rayann Heman   TEE WITHOUT CARDIOVERSION N/A 04/07/2021   Procedure: TRANSESOPHAGEAL ECHOCARDIOGRAM (TEE);  Surgeon: Elouise Munroe, MD;  Location: [place];  Service: Cardiology;  Laterality: N/A;   TEMPORARY PACEMAKER INSERTION N/A 01/21/2015   Procedure: TEMPORARY PACEMAKER INSERTION;  Surgeon: Leonie Man, MD;  Location: [facility] CATH LAB;  Service: Cardiovascular;  Laterality: N/A;    ROS- all systems are reviewed and negative except as per HPI above  Current Outpatient Medications  Medication Sig Dispense Refill   aspirin EC 81 MG tablet Take 1 tablet (81 mg total) by mouth daily. 30 tablet 0   atorvastatin (LIPITOR) 80 MG tablet Take 1 tablet (80 mg total) by mouth daily. 90 tablet 3    carvedilol (COREG) 25 MG tablet Take 1 tablet (25 mg total) by mouth 2 (two) times daily. 180 tablet 3   lisinopril (ZESTRIL) 40 MG tablet Take 1 tablet (40 mg total) by mouth daily. 30 tablet 11   metFORMIN (GLUCOPHAGE) 500 MG tablet Take 1 tablet (500 mg total) by mouth 2 (two) times daily with a meal. 60 tablet 0   Multiple Vitamins-Minerals (CENTRUM ADULTS) TABS Take 1 tablet by mouth daily.     nitroGLYCERIN (NITROSTAT) 0.4 MG SL tablet Place 1 tablet (0.4 mg total) under the tongue every 5 (five) minutes as needed for chest pain. 20 tablet 0   furosemide (LASIX) 40 MG tablet Take 1 tablet (40 mg total) by mouth daily as needed for fluid or edema (If weight gain >3-5 pounds in 24hrs.). 30 tablet 0   No current facility-administered medications for this visit.    Physical Exam: Vitals:   06/03/21 1022  BP: (!) 156/90  Pulse: 61  SpO2: 93%  Weight: 253 lb (114.8 kg)  Height: '5\' 9"'$  (1.753 m)    GEN- The patient is well appearing, alert and oriented x 3 today.   Head- normocephalic, atraumatic Eyes-  Sclera clear, conjunctiva pink Ears- hearing intact Oropharynx- clear Lungs- Clear to ausculation bilaterally, normal work of breathing Chest- pacemaker pocket is well healed Heart- Regular rate and rhythm, no murmurs, rubs or gallops, PMI not laterally displaced GI- soft, NT, ND, + BS Extremities- no clubbing, cyanosis, or edema  Pacemaker  interrogation- reviewed in detail today,  See PACEART report  ekg tracing ordered today is personally reviewed and shows AV dissociated, V paced  Assessment and Plan:  1. Symptomatic complete heart block Normal pacemaker function See Pace Art report Rate response is adjusted today to promote heart rate increase with activity he is device dependant today At time of implant, only a ventricular lead could be placed due to venous anatomy issues. He has done great with VVI pacing and currently we do not plan to upgrade his device.  2.  HTN Continue lisinopril '40mg'$  daily  3. CAD No ischemic symptosm No changes  4. Obesity Body mass index is 37.36 kg/m. Lifestyle modification is advised  5. Polymicrobial bacteremia with strep anginosus and enterococcus admission 6/22. Received 2 weeks of IV antibiotics.  TEE reviewed by me, without obvious vegetation. If further bacteremia, EP should be consulted and device removed.he has had no further symptoms since June.  Risks, benefits and potential toxicities for medications prescribed and/or refilled reviewed with patient today.   Return in a year  Thompson Grayer MD, [facility] 06/03/2021 10:52 AM

## 2021-06-03 NOTE — Patient Instructions (Signed)
Medication Instructions:  Continue all current medications.  Labwork: none  Testing/Procedures: none  Follow-Up: 1 year   Any Other Special Instructions Will Be Listed Below (If Applicable).  If you need a refill on your cardiac medications before your next appointment, please call your pharmacy.  

## 2021-06-07 ENCOUNTER — Ambulatory Visit: Payer: PPO | Admitting: Family

## 2021-06-14 ENCOUNTER — Ambulatory Visit: Payer: PPO | Admitting: Family

## 2021-06-27 DIAGNOSIS — E11622 Type 2 diabetes mellitus with other skin ulcer: Secondary | ICD-10-CM | POA: Diagnosis not present

## 2021-06-27 DIAGNOSIS — I517 Cardiomegaly: Secondary | ICD-10-CM | POA: Diagnosis not present

## 2021-06-27 DIAGNOSIS — L97415 Non-pressure chronic ulcer of right heel and midfoot with muscle involvement without evidence of necrosis: Secondary | ICD-10-CM | POA: Diagnosis not present

## 2021-06-27 DIAGNOSIS — J189 Pneumonia, unspecified organism: Secondary | ICD-10-CM | POA: Diagnosis not present

## 2021-06-27 DIAGNOSIS — R4182 Altered mental status, unspecified: Secondary | ICD-10-CM | POA: Diagnosis not present

## 2021-06-27 DIAGNOSIS — E1165 Type 2 diabetes mellitus with hyperglycemia: Secondary | ICD-10-CM | POA: Diagnosis not present

## 2021-06-27 DIAGNOSIS — R0689 Other abnormalities of breathing: Secondary | ICD-10-CM | POA: Diagnosis not present

## 2021-06-27 DIAGNOSIS — R2243 Localized swelling, mass and lump, lower limb, bilateral: Secondary | ICD-10-CM | POA: Diagnosis not present

## 2021-06-27 DIAGNOSIS — I509 Heart failure, unspecified: Secondary | ICD-10-CM | POA: Diagnosis not present

## 2021-06-27 DIAGNOSIS — E11621 Type 2 diabetes mellitus with foot ulcer: Secondary | ICD-10-CM | POA: Diagnosis not present

## 2021-06-27 DIAGNOSIS — I110 Hypertensive heart disease with heart failure: Secondary | ICD-10-CM | POA: Diagnosis not present

## 2021-06-27 DIAGNOSIS — Z950 Presence of cardiac pacemaker: Secondary | ICD-10-CM | POA: Diagnosis not present

## 2021-06-27 DIAGNOSIS — M869 Osteomyelitis, unspecified: Secondary | ICD-10-CM | POA: Diagnosis not present

## 2021-06-27 DIAGNOSIS — J9811 Atelectasis: Secondary | ICD-10-CM | POA: Diagnosis not present

## 2021-06-27 DIAGNOSIS — L97413 Non-pressure chronic ulcer of right heel and midfoot with necrosis of muscle: Secondary | ICD-10-CM | POA: Diagnosis not present

## 2021-06-27 DIAGNOSIS — B952 Enterococcus as the cause of diseases classified elsewhere: Secondary | ICD-10-CM | POA: Diagnosis not present

## 2021-06-27 DIAGNOSIS — D62 Acute posthemorrhagic anemia: Secondary | ICD-10-CM | POA: Diagnosis not present

## 2021-06-27 DIAGNOSIS — L97509 Non-pressure chronic ulcer of other part of unspecified foot with unspecified severity: Secondary | ICD-10-CM | POA: Diagnosis not present

## 2021-06-27 DIAGNOSIS — B955 Unspecified streptococcus as the cause of diseases classified elsewhere: Secondary | ICD-10-CM | POA: Diagnosis not present

## 2021-06-27 DIAGNOSIS — I442 Atrioventricular block, complete: Secondary | ICD-10-CM | POA: Diagnosis not present

## 2021-06-27 DIAGNOSIS — Z6841 Body Mass Index (BMI) 40.0 and over, adult: Secondary | ICD-10-CM | POA: Diagnosis not present

## 2021-06-27 DIAGNOSIS — I248 Other forms of acute ischemic heart disease: Secondary | ICD-10-CM | POA: Diagnosis not present

## 2021-06-27 DIAGNOSIS — Z87891 Personal history of nicotine dependence: Secondary | ICD-10-CM | POA: Diagnosis not present

## 2021-06-27 DIAGNOSIS — Z7982 Long term (current) use of aspirin: Secondary | ICD-10-CM | POA: Diagnosis not present

## 2021-06-27 DIAGNOSIS — R7881 Bacteremia: Secondary | ICD-10-CM | POA: Diagnosis not present

## 2021-06-27 DIAGNOSIS — E119 Type 2 diabetes mellitus without complications: Secondary | ICD-10-CM | POA: Diagnosis not present

## 2021-06-27 DIAGNOSIS — E871 Hypo-osmolality and hyponatremia: Secondary | ICD-10-CM | POA: Diagnosis not present

## 2021-06-27 DIAGNOSIS — E1169 Type 2 diabetes mellitus with other specified complication: Secondary | ICD-10-CM | POA: Diagnosis not present

## 2021-06-27 DIAGNOSIS — Z79899 Other long term (current) drug therapy: Secondary | ICD-10-CM | POA: Diagnosis not present

## 2021-06-27 DIAGNOSIS — Z20822 Contact with and (suspected) exposure to covid-19: Secondary | ICD-10-CM | POA: Diagnosis not present

## 2021-06-27 DIAGNOSIS — J9621 Acute and chronic respiratory failure with hypoxia: Secondary | ICD-10-CM | POA: Diagnosis not present

## 2021-06-27 DIAGNOSIS — J9622 Acute and chronic respiratory failure with hypercapnia: Secondary | ICD-10-CM | POA: Diagnosis not present

## 2021-06-27 DIAGNOSIS — E1151 Type 2 diabetes mellitus with diabetic peripheral angiopathy without gangrene: Secondary | ICD-10-CM | POA: Diagnosis not present

## 2021-06-27 DIAGNOSIS — G9341 Metabolic encephalopathy: Secondary | ICD-10-CM | POA: Diagnosis not present

## 2021-06-27 DIAGNOSIS — I890 Lymphedema, not elsewhere classified: Secondary | ICD-10-CM | POA: Diagnosis not present

## 2021-06-27 DIAGNOSIS — L97419 Non-pressure chronic ulcer of right heel and midfoot with unspecified severity: Secondary | ICD-10-CM | POA: Diagnosis not present

## 2021-06-27 DIAGNOSIS — R7989 Other specified abnormal findings of blood chemistry: Secondary | ICD-10-CM | POA: Diagnosis not present

## 2021-06-27 DIAGNOSIS — L97519 Non-pressure chronic ulcer of other part of right foot with unspecified severity: Secondary | ICD-10-CM | POA: Diagnosis not present

## 2021-06-27 DIAGNOSIS — E118 Type 2 diabetes mellitus with unspecified complications: Secondary | ICD-10-CM | POA: Diagnosis not present

## 2021-06-27 DIAGNOSIS — I959 Hypotension, unspecified: Secondary | ICD-10-CM | POA: Diagnosis not present

## 2021-06-27 DIAGNOSIS — I5033 Acute on chronic diastolic (congestive) heart failure: Secondary | ICD-10-CM | POA: Diagnosis not present

## 2021-06-27 DIAGNOSIS — R0902 Hypoxemia: Secondary | ICD-10-CM | POA: Diagnosis not present

## 2021-06-27 DIAGNOSIS — Z8679 Personal history of other diseases of the circulatory system: Secondary | ICD-10-CM | POA: Diagnosis not present

## 2021-06-27 DIAGNOSIS — R000 Tachycardia, unspecified: Secondary | ICD-10-CM | POA: Diagnosis not present

## 2021-06-27 DIAGNOSIS — E11610 Type 2 diabetes mellitus with diabetic neuropathic arthropathy: Secondary | ICD-10-CM | POA: Diagnosis not present

## 2021-06-27 DIAGNOSIS — R451 Restlessness and agitation: Secondary | ICD-10-CM | POA: Diagnosis not present

## 2021-06-27 DIAGNOSIS — R0602 Shortness of breath: Secondary | ICD-10-CM | POA: Diagnosis not present

## 2021-06-27 DIAGNOSIS — Z794 Long term (current) use of insulin: Secondary | ICD-10-CM | POA: Diagnosis not present

## 2021-06-27 DIAGNOSIS — M868X7 Other osteomyelitis, ankle and foot: Secondary | ICD-10-CM | POA: Diagnosis not present

## 2021-06-27 DIAGNOSIS — I252 Old myocardial infarction: Secondary | ICD-10-CM | POA: Diagnosis not present

## 2021-06-27 DIAGNOSIS — B9689 Other specified bacterial agents as the cause of diseases classified elsewhere: Secondary | ICD-10-CM | POA: Diagnosis not present

## 2021-06-27 DIAGNOSIS — B9561 Methicillin susceptible Staphylococcus aureus infection as the cause of diseases classified elsewhere: Secondary | ICD-10-CM | POA: Diagnosis not present

## 2021-06-27 DIAGNOSIS — E785 Hyperlipidemia, unspecified: Secondary | ICD-10-CM | POA: Diagnosis not present

## 2021-06-27 DIAGNOSIS — R410 Disorientation, unspecified: Secondary | ICD-10-CM | POA: Diagnosis not present

## 2021-06-27 DIAGNOSIS — J439 Emphysema, unspecified: Secondary | ICD-10-CM | POA: Diagnosis not present

## 2021-06-27 DIAGNOSIS — I2510 Atherosclerotic heart disease of native coronary artery without angina pectoris: Secondary | ICD-10-CM | POA: Diagnosis not present

## 2021-06-27 DIAGNOSIS — D72829 Elevated white blood cell count, unspecified: Secondary | ICD-10-CM | POA: Diagnosis not present

## 2021-06-27 DIAGNOSIS — R059 Cough, unspecified: Secondary | ICD-10-CM | POA: Diagnosis not present

## 2021-06-28 DIAGNOSIS — I5033 Acute on chronic diastolic (congestive) heart failure: Secondary | ICD-10-CM | POA: Diagnosis not present

## 2021-06-28 DIAGNOSIS — E871 Hypo-osmolality and hyponatremia: Secondary | ICD-10-CM | POA: Diagnosis not present

## 2021-06-28 DIAGNOSIS — B952 Enterococcus as the cause of diseases classified elsewhere: Secondary | ICD-10-CM | POA: Diagnosis not present

## 2021-06-28 DIAGNOSIS — I248 Other forms of acute ischemic heart disease: Secondary | ICD-10-CM | POA: Diagnosis not present

## 2021-06-28 DIAGNOSIS — B9689 Other specified bacterial agents as the cause of diseases classified elsewhere: Secondary | ICD-10-CM | POA: Diagnosis not present

## 2021-06-28 DIAGNOSIS — I2510 Atherosclerotic heart disease of native coronary artery without angina pectoris: Secondary | ICD-10-CM | POA: Diagnosis not present

## 2021-06-28 DIAGNOSIS — I442 Atrioventricular block, complete: Secondary | ICD-10-CM | POA: Diagnosis not present

## 2021-06-28 DIAGNOSIS — R7881 Bacteremia: Secondary | ICD-10-CM | POA: Diagnosis not present

## 2021-06-28 DIAGNOSIS — I959 Hypotension, unspecified: Secondary | ICD-10-CM | POA: Diagnosis not present

## 2021-06-28 DIAGNOSIS — D72829 Elevated white blood cell count, unspecified: Secondary | ICD-10-CM | POA: Diagnosis not present

## 2021-06-28 DIAGNOSIS — B955 Unspecified streptococcus as the cause of diseases classified elsewhere: Secondary | ICD-10-CM | POA: Diagnosis not present

## 2021-06-29 DIAGNOSIS — I5033 Acute on chronic diastolic (congestive) heart failure: Secondary | ICD-10-CM | POA: Diagnosis not present

## 2021-06-29 DIAGNOSIS — L97519 Non-pressure chronic ulcer of other part of right foot with unspecified severity: Secondary | ICD-10-CM | POA: Diagnosis not present

## 2021-06-29 DIAGNOSIS — B9689 Other specified bacterial agents as the cause of diseases classified elsewhere: Secondary | ICD-10-CM | POA: Diagnosis not present

## 2021-06-29 DIAGNOSIS — R0902 Hypoxemia: Secondary | ICD-10-CM | POA: Diagnosis not present

## 2021-06-29 DIAGNOSIS — R7881 Bacteremia: Secondary | ICD-10-CM | POA: Diagnosis not present

## 2021-06-29 DIAGNOSIS — E11621 Type 2 diabetes mellitus with foot ulcer: Secondary | ICD-10-CM | POA: Diagnosis not present

## 2021-06-29 DIAGNOSIS — E119 Type 2 diabetes mellitus without complications: Secondary | ICD-10-CM | POA: Diagnosis not present

## 2021-06-29 DIAGNOSIS — R7989 Other specified abnormal findings of blood chemistry: Secondary | ICD-10-CM | POA: Diagnosis not present

## 2021-06-30 DIAGNOSIS — E11621 Type 2 diabetes mellitus with foot ulcer: Secondary | ICD-10-CM | POA: Diagnosis not present

## 2021-06-30 DIAGNOSIS — R7881 Bacteremia: Secondary | ICD-10-CM | POA: Diagnosis not present

## 2021-06-30 DIAGNOSIS — L97419 Non-pressure chronic ulcer of right heel and midfoot with unspecified severity: Secondary | ICD-10-CM | POA: Diagnosis not present

## 2021-06-30 DIAGNOSIS — B9689 Other specified bacterial agents as the cause of diseases classified elsewhere: Secondary | ICD-10-CM | POA: Diagnosis not present

## 2021-06-30 DIAGNOSIS — I5033 Acute on chronic diastolic (congestive) heart failure: Secondary | ICD-10-CM | POA: Diagnosis not present

## 2021-06-30 DIAGNOSIS — R7989 Other specified abnormal findings of blood chemistry: Secondary | ICD-10-CM | POA: Diagnosis not present

## 2021-06-30 DIAGNOSIS — R0902 Hypoxemia: Secondary | ICD-10-CM | POA: Diagnosis not present

## 2021-07-06 ENCOUNTER — Ambulatory Visit: Payer: PPO | Admitting: Family

## 2021-07-06 ENCOUNTER — Telehealth: Payer: Self-pay | Admitting: Internal Medicine

## 2021-07-06 NOTE — Telephone Encounter (Signed)
Spoke with pt since there is no DPR on file.  Advised him that his device is MRI compatible, to have radiology department contact us for device settings.

## 2021-07-06 NOTE — Telephone Encounter (Signed)
Pt is currently admitted at [facility] in [place] and his wife is needing to know if his device is MRI compatible.   Please call Baker Janus - pt's wife @ [phone]

## 2021-07-08 ENCOUNTER — Encounter (HOSPITAL_BASED_OUTPATIENT_CLINIC_OR_DEPARTMENT_OTHER): Payer: PPO | Admitting: Internal Medicine

## 2021-07-13 DIAGNOSIS — I5033 Acute on chronic diastolic (congestive) heart failure: Secondary | ICD-10-CM | POA: Diagnosis not present

## 2021-07-15 DIAGNOSIS — B9561 Methicillin susceptible Staphylococcus aureus infection as the cause of diseases classified elsewhere: Secondary | ICD-10-CM | POA: Diagnosis not present

## 2021-07-15 DIAGNOSIS — I2510 Atherosclerotic heart disease of native coronary artery without angina pectoris: Secondary | ICD-10-CM | POA: Diagnosis not present

## 2021-07-15 DIAGNOSIS — Z950 Presence of cardiac pacemaker: Secondary | ICD-10-CM | POA: Diagnosis not present

## 2021-07-15 DIAGNOSIS — Z87891 Personal history of nicotine dependence: Secondary | ICD-10-CM | POA: Diagnosis not present

## 2021-07-15 DIAGNOSIS — M869 Osteomyelitis, unspecified: Secondary | ICD-10-CM | POA: Diagnosis not present

## 2021-07-15 DIAGNOSIS — Z6838 Body mass index (BMI) 38.0-38.9, adult: Secondary | ICD-10-CM | POA: Diagnosis not present

## 2021-07-15 DIAGNOSIS — Z7982 Long term (current) use of aspirin: Secondary | ICD-10-CM | POA: Diagnosis not present

## 2021-07-15 DIAGNOSIS — E11610 Type 2 diabetes mellitus with diabetic neuropathic arthropathy: Secondary | ICD-10-CM | POA: Diagnosis not present

## 2021-07-15 DIAGNOSIS — I5033 Acute on chronic diastolic (congestive) heart failure: Secondary | ICD-10-CM | POA: Diagnosis not present

## 2021-07-15 DIAGNOSIS — L97415 Non-pressure chronic ulcer of right heel and midfoot with muscle involvement without evidence of necrosis: Secondary | ICD-10-CM | POA: Diagnosis not present

## 2021-07-15 DIAGNOSIS — B952 Enterococcus as the cause of diseases classified elsewhere: Secondary | ICD-10-CM | POA: Diagnosis not present

## 2021-07-15 DIAGNOSIS — E11621 Type 2 diabetes mellitus with foot ulcer: Secondary | ICD-10-CM | POA: Diagnosis not present

## 2021-07-15 DIAGNOSIS — Z9181 History of falling: Secondary | ICD-10-CM | POA: Diagnosis not present

## 2021-07-15 DIAGNOSIS — D649 Anemia, unspecified: Secondary | ICD-10-CM | POA: Diagnosis not present

## 2021-07-15 DIAGNOSIS — E871 Hypo-osmolality and hyponatremia: Secondary | ICD-10-CM | POA: Diagnosis not present

## 2021-07-15 DIAGNOSIS — K5792 Diverticulitis of intestine, part unspecified, without perforation or abscess without bleeding: Secondary | ICD-10-CM | POA: Diagnosis not present

## 2021-07-15 DIAGNOSIS — I959 Hypotension, unspecified: Secondary | ICD-10-CM | POA: Diagnosis not present

## 2021-07-15 DIAGNOSIS — I890 Lymphedema, not elsewhere classified: Secondary | ICD-10-CM | POA: Diagnosis not present

## 2021-07-15 DIAGNOSIS — Z9981 Dependence on supplemental oxygen: Secondary | ICD-10-CM | POA: Diagnosis not present

## 2021-07-15 DIAGNOSIS — E1169 Type 2 diabetes mellitus with other specified complication: Secondary | ICD-10-CM | POA: Diagnosis not present

## 2021-07-15 DIAGNOSIS — J9621 Acute and chronic respiratory failure with hypoxia: Secondary | ICD-10-CM | POA: Diagnosis not present

## 2021-07-15 DIAGNOSIS — I442 Atrioventricular block, complete: Secondary | ICD-10-CM | POA: Diagnosis not present

## 2021-07-15 DIAGNOSIS — I110 Hypertensive heart disease with heart failure: Secondary | ICD-10-CM | POA: Diagnosis not present

## 2021-07-15 DIAGNOSIS — Z7984 Long term (current) use of oral hypoglycemic drugs: Secondary | ICD-10-CM | POA: Diagnosis not present

## 2021-07-18 DIAGNOSIS — I509 Heart failure, unspecified: Secondary | ICD-10-CM | POA: Diagnosis not present

## 2021-07-18 DIAGNOSIS — E871 Hypo-osmolality and hyponatremia: Secondary | ICD-10-CM | POA: Diagnosis not present

## 2021-07-18 DIAGNOSIS — R0902 Hypoxemia: Secondary | ICD-10-CM | POA: Diagnosis not present

## 2021-07-20 DIAGNOSIS — Z23 Encounter for immunization: Secondary | ICD-10-CM | POA: Diagnosis not present

## 2021-07-20 DIAGNOSIS — L97415 Non-pressure chronic ulcer of right heel and midfoot with muscle involvement without evidence of necrosis: Secondary | ICD-10-CM | POA: Diagnosis not present

## 2021-07-20 DIAGNOSIS — Z87891 Personal history of nicotine dependence: Secondary | ICD-10-CM | POA: Diagnosis not present

## 2021-07-20 DIAGNOSIS — Z7984 Long term (current) use of oral hypoglycemic drugs: Secondary | ICD-10-CM | POA: Diagnosis not present

## 2021-07-20 DIAGNOSIS — E11621 Type 2 diabetes mellitus with foot ulcer: Secondary | ICD-10-CM | POA: Diagnosis not present

## 2021-07-20 DIAGNOSIS — I509 Heart failure, unspecified: Secondary | ICD-10-CM | POA: Diagnosis not present

## 2021-07-20 DIAGNOSIS — D649 Anemia, unspecified: Secondary | ICD-10-CM | POA: Diagnosis not present

## 2021-07-20 DIAGNOSIS — I110 Hypertensive heart disease with heart failure: Secondary | ICD-10-CM | POA: Diagnosis not present

## 2021-07-22 DIAGNOSIS — Z7984 Long term (current) use of oral hypoglycemic drugs: Secondary | ICD-10-CM | POA: Diagnosis not present

## 2021-07-22 DIAGNOSIS — Z6838 Body mass index (BMI) 38.0-38.9, adult: Secondary | ICD-10-CM | POA: Diagnosis not present

## 2021-07-22 DIAGNOSIS — I442 Atrioventricular block, complete: Secondary | ICD-10-CM | POA: Diagnosis not present

## 2021-07-22 DIAGNOSIS — L97415 Non-pressure chronic ulcer of right heel and midfoot with muscle involvement without evidence of necrosis: Secondary | ICD-10-CM | POA: Diagnosis not present

## 2021-07-22 DIAGNOSIS — Z7982 Long term (current) use of aspirin: Secondary | ICD-10-CM | POA: Diagnosis not present

## 2021-07-22 DIAGNOSIS — E11621 Type 2 diabetes mellitus with foot ulcer: Secondary | ICD-10-CM | POA: Diagnosis not present

## 2021-07-22 DIAGNOSIS — Z87891 Personal history of nicotine dependence: Secondary | ICD-10-CM | POA: Diagnosis not present

## 2021-07-22 DIAGNOSIS — M869 Osteomyelitis, unspecified: Secondary | ICD-10-CM | POA: Diagnosis not present

## 2021-07-22 DIAGNOSIS — Z9981 Dependence on supplemental oxygen: Secondary | ICD-10-CM | POA: Diagnosis not present

## 2021-07-22 DIAGNOSIS — J9621 Acute and chronic respiratory failure with hypoxia: Secondary | ICD-10-CM | POA: Diagnosis not present

## 2021-07-22 DIAGNOSIS — E1169 Type 2 diabetes mellitus with other specified complication: Secondary | ICD-10-CM | POA: Diagnosis not present

## 2021-07-22 DIAGNOSIS — D649 Anemia, unspecified: Secondary | ICD-10-CM | POA: Diagnosis not present

## 2021-07-22 DIAGNOSIS — I890 Lymphedema, not elsewhere classified: Secondary | ICD-10-CM | POA: Diagnosis not present

## 2021-07-22 DIAGNOSIS — I2510 Atherosclerotic heart disease of native coronary artery without angina pectoris: Secondary | ICD-10-CM | POA: Diagnosis not present

## 2021-07-22 DIAGNOSIS — Z950 Presence of cardiac pacemaker: Secondary | ICD-10-CM | POA: Diagnosis not present

## 2021-07-22 DIAGNOSIS — I110 Hypertensive heart disease with heart failure: Secondary | ICD-10-CM | POA: Diagnosis not present

## 2021-07-22 DIAGNOSIS — E11610 Type 2 diabetes mellitus with diabetic neuropathic arthropathy: Secondary | ICD-10-CM | POA: Diagnosis not present

## 2021-07-22 DIAGNOSIS — I5033 Acute on chronic diastolic (congestive) heart failure: Secondary | ICD-10-CM | POA: Diagnosis not present

## 2021-07-22 DIAGNOSIS — Z9181 History of falling: Secondary | ICD-10-CM | POA: Diagnosis not present

## 2021-07-22 DIAGNOSIS — E871 Hypo-osmolality and hyponatremia: Secondary | ICD-10-CM | POA: Diagnosis not present

## 2021-07-22 DIAGNOSIS — K5792 Diverticulitis of intestine, part unspecified, without perforation or abscess without bleeding: Secondary | ICD-10-CM | POA: Diagnosis not present

## 2021-07-22 DIAGNOSIS — B9561 Methicillin susceptible Staphylococcus aureus infection as the cause of diseases classified elsewhere: Secondary | ICD-10-CM | POA: Diagnosis not present

## 2021-07-22 DIAGNOSIS — B952 Enterococcus as the cause of diseases classified elsewhere: Secondary | ICD-10-CM | POA: Diagnosis not present

## 2021-07-22 DIAGNOSIS — I959 Hypotension, unspecified: Secondary | ICD-10-CM | POA: Diagnosis not present

## 2021-07-26 DIAGNOSIS — I5033 Acute on chronic diastolic (congestive) heart failure: Secondary | ICD-10-CM | POA: Diagnosis not present

## 2021-07-26 DIAGNOSIS — Z7984 Long term (current) use of oral hypoglycemic drugs: Secondary | ICD-10-CM | POA: Diagnosis not present

## 2021-07-26 DIAGNOSIS — I959 Hypotension, unspecified: Secondary | ICD-10-CM | POA: Diagnosis not present

## 2021-07-26 DIAGNOSIS — Z7982 Long term (current) use of aspirin: Secondary | ICD-10-CM | POA: Diagnosis not present

## 2021-07-26 DIAGNOSIS — I442 Atrioventricular block, complete: Secondary | ICD-10-CM | POA: Diagnosis not present

## 2021-07-26 DIAGNOSIS — Z6838 Body mass index (BMI) 38.0-38.9, adult: Secondary | ICD-10-CM | POA: Diagnosis not present

## 2021-07-26 DIAGNOSIS — J9621 Acute and chronic respiratory failure with hypoxia: Secondary | ICD-10-CM | POA: Diagnosis not present

## 2021-07-26 DIAGNOSIS — Z87891 Personal history of nicotine dependence: Secondary | ICD-10-CM | POA: Diagnosis not present

## 2021-07-26 DIAGNOSIS — I2510 Atherosclerotic heart disease of native coronary artery without angina pectoris: Secondary | ICD-10-CM | POA: Diagnosis not present

## 2021-07-26 DIAGNOSIS — L97415 Non-pressure chronic ulcer of right heel and midfoot with muscle involvement without evidence of necrosis: Secondary | ICD-10-CM | POA: Diagnosis not present

## 2021-07-26 DIAGNOSIS — M869 Osteomyelitis, unspecified: Secondary | ICD-10-CM | POA: Diagnosis not present

## 2021-07-26 DIAGNOSIS — I890 Lymphedema, not elsewhere classified: Secondary | ICD-10-CM | POA: Diagnosis not present

## 2021-07-26 DIAGNOSIS — Z950 Presence of cardiac pacemaker: Secondary | ICD-10-CM | POA: Diagnosis not present

## 2021-07-26 DIAGNOSIS — E871 Hypo-osmolality and hyponatremia: Secondary | ICD-10-CM | POA: Diagnosis not present

## 2021-07-26 DIAGNOSIS — B952 Enterococcus as the cause of diseases classified elsewhere: Secondary | ICD-10-CM | POA: Diagnosis not present

## 2021-07-26 DIAGNOSIS — Z9181 History of falling: Secondary | ICD-10-CM | POA: Diagnosis not present

## 2021-07-26 DIAGNOSIS — K5792 Diverticulitis of intestine, part unspecified, without perforation or abscess without bleeding: Secondary | ICD-10-CM | POA: Diagnosis not present

## 2021-07-26 DIAGNOSIS — I110 Hypertensive heart disease with heart failure: Secondary | ICD-10-CM | POA: Diagnosis not present

## 2021-07-26 DIAGNOSIS — D649 Anemia, unspecified: Secondary | ICD-10-CM | POA: Diagnosis not present

## 2021-07-26 DIAGNOSIS — E11610 Type 2 diabetes mellitus with diabetic neuropathic arthropathy: Secondary | ICD-10-CM | POA: Diagnosis not present

## 2021-07-26 DIAGNOSIS — B9561 Methicillin susceptible Staphylococcus aureus infection as the cause of diseases classified elsewhere: Secondary | ICD-10-CM | POA: Diagnosis not present

## 2021-07-26 DIAGNOSIS — Z9981 Dependence on supplemental oxygen: Secondary | ICD-10-CM | POA: Diagnosis not present

## 2021-07-26 DIAGNOSIS — E11621 Type 2 diabetes mellitus with foot ulcer: Secondary | ICD-10-CM | POA: Diagnosis not present

## 2021-07-26 DIAGNOSIS — E1169 Type 2 diabetes mellitus with other specified complication: Secondary | ICD-10-CM | POA: Diagnosis not present

## 2021-07-27 DIAGNOSIS — R7981 Abnormal blood-gas level: Secondary | ICD-10-CM | POA: Diagnosis not present

## 2021-07-27 DIAGNOSIS — R7989 Other specified abnormal findings of blood chemistry: Secondary | ICD-10-CM | POA: Diagnosis not present

## 2021-07-27 DIAGNOSIS — R0602 Shortness of breath: Secondary | ICD-10-CM | POA: Diagnosis not present

## 2021-07-27 DIAGNOSIS — D649 Anemia, unspecified: Secondary | ICD-10-CM | POA: Diagnosis not present

## 2021-07-29 DIAGNOSIS — Z9981 Dependence on supplemental oxygen: Secondary | ICD-10-CM | POA: Diagnosis not present

## 2021-07-29 DIAGNOSIS — J9621 Acute and chronic respiratory failure with hypoxia: Secondary | ICD-10-CM | POA: Diagnosis not present

## 2021-07-29 DIAGNOSIS — L97415 Non-pressure chronic ulcer of right heel and midfoot with muscle involvement without evidence of necrosis: Secondary | ICD-10-CM | POA: Diagnosis not present

## 2021-07-29 DIAGNOSIS — I442 Atrioventricular block, complete: Secondary | ICD-10-CM | POA: Diagnosis not present

## 2021-07-29 DIAGNOSIS — D649 Anemia, unspecified: Secondary | ICD-10-CM | POA: Diagnosis not present

## 2021-07-29 DIAGNOSIS — Z9181 History of falling: Secondary | ICD-10-CM | POA: Diagnosis not present

## 2021-07-29 DIAGNOSIS — Z6838 Body mass index (BMI) 38.0-38.9, adult: Secondary | ICD-10-CM | POA: Diagnosis not present

## 2021-07-29 DIAGNOSIS — I2510 Atherosclerotic heart disease of native coronary artery without angina pectoris: Secondary | ICD-10-CM | POA: Diagnosis not present

## 2021-07-29 DIAGNOSIS — Z950 Presence of cardiac pacemaker: Secondary | ICD-10-CM | POA: Diagnosis not present

## 2021-07-29 DIAGNOSIS — I959 Hypotension, unspecified: Secondary | ICD-10-CM | POA: Diagnosis not present

## 2021-07-29 DIAGNOSIS — B9561 Methicillin susceptible Staphylococcus aureus infection as the cause of diseases classified elsewhere: Secondary | ICD-10-CM | POA: Diagnosis not present

## 2021-07-29 DIAGNOSIS — Z7984 Long term (current) use of oral hypoglycemic drugs: Secondary | ICD-10-CM | POA: Diagnosis not present

## 2021-07-29 DIAGNOSIS — I110 Hypertensive heart disease with heart failure: Secondary | ICD-10-CM | POA: Diagnosis not present

## 2021-07-29 DIAGNOSIS — Z7982 Long term (current) use of aspirin: Secondary | ICD-10-CM | POA: Diagnosis not present

## 2021-07-29 DIAGNOSIS — I5033 Acute on chronic diastolic (congestive) heart failure: Secondary | ICD-10-CM | POA: Diagnosis not present

## 2021-07-29 DIAGNOSIS — B952 Enterococcus as the cause of diseases classified elsewhere: Secondary | ICD-10-CM | POA: Diagnosis not present

## 2021-07-29 DIAGNOSIS — E11621 Type 2 diabetes mellitus with foot ulcer: Secondary | ICD-10-CM | POA: Diagnosis not present

## 2021-07-29 DIAGNOSIS — Z87891 Personal history of nicotine dependence: Secondary | ICD-10-CM | POA: Diagnosis not present

## 2021-07-29 DIAGNOSIS — M869 Osteomyelitis, unspecified: Secondary | ICD-10-CM | POA: Diagnosis not present

## 2021-07-29 DIAGNOSIS — E1169 Type 2 diabetes mellitus with other specified complication: Secondary | ICD-10-CM | POA: Diagnosis not present

## 2021-07-29 DIAGNOSIS — K5792 Diverticulitis of intestine, part unspecified, without perforation or abscess without bleeding: Secondary | ICD-10-CM | POA: Diagnosis not present

## 2021-07-29 DIAGNOSIS — I890 Lymphedema, not elsewhere classified: Secondary | ICD-10-CM | POA: Diagnosis not present

## 2021-07-29 DIAGNOSIS — E11610 Type 2 diabetes mellitus with diabetic neuropathic arthropathy: Secondary | ICD-10-CM | POA: Diagnosis not present

## 2021-07-29 DIAGNOSIS — E871 Hypo-osmolality and hyponatremia: Secondary | ICD-10-CM | POA: Diagnosis not present

## 2021-08-01 ENCOUNTER — Encounter (HOSPITAL_BASED_OUTPATIENT_CLINIC_OR_DEPARTMENT_OTHER): Payer: PPO | Attending: Internal Medicine | Admitting: Internal Medicine

## 2021-08-01 ENCOUNTER — Encounter (HOSPITAL_COMMUNITY): Payer: Self-pay

## 2021-08-01 ENCOUNTER — Inpatient Hospital Stay (HOSPITAL_COMMUNITY): Payer: PPO

## 2021-08-01 ENCOUNTER — Other Ambulatory Visit: Payer: Self-pay

## 2021-08-01 ENCOUNTER — Inpatient Hospital Stay (HOSPITAL_COMMUNITY)
Admission: EM | Admit: 2021-08-01 | Discharge: 2021-08-09 | DRG: 617 | Disposition: A | Discharge status: Skilled Nursing Facility | Payer: PPO | Attending: Internal Medicine | Admitting: Internal Medicine

## 2021-08-01 ENCOUNTER — Emergency Department (HOSPITAL_COMMUNITY): Payer: PPO

## 2021-08-01 DIAGNOSIS — Z6838 Body mass index (BMI) 38.0-38.9, adult: Secondary | ICD-10-CM | POA: Diagnosis not present

## 2021-08-01 DIAGNOSIS — K648 Other hemorrhoids: Secondary | ICD-10-CM | POA: Diagnosis present

## 2021-08-01 DIAGNOSIS — M14671 Charcot's joint, right ankle and foot: Secondary | ICD-10-CM | POA: Diagnosis not present

## 2021-08-01 DIAGNOSIS — L97919 Non-pressure chronic ulcer of unspecified part of right lower leg with unspecified severity: Secondary | ICD-10-CM | POA: Diagnosis not present

## 2021-08-01 DIAGNOSIS — Z79899 Other long term (current) drug therapy: Secondary | ICD-10-CM | POA: Diagnosis not present

## 2021-08-01 DIAGNOSIS — M86671 Other chronic osteomyelitis, right ankle and foot: Secondary | ICD-10-CM | POA: Diagnosis not present

## 2021-08-01 DIAGNOSIS — L02415 Cutaneous abscess of right lower limb: Secondary | ICD-10-CM | POA: Diagnosis not present

## 2021-08-01 DIAGNOSIS — T402X5A Adverse effect of other opioids, initial encounter: Secondary | ICD-10-CM | POA: Diagnosis present

## 2021-08-01 DIAGNOSIS — Z20822 Contact with and (suspected) exposure to covid-19: Secondary | ICD-10-CM | POA: Diagnosis present

## 2021-08-01 DIAGNOSIS — E1142 Type 2 diabetes mellitus with diabetic polyneuropathy: Secondary | ICD-10-CM | POA: Diagnosis not present

## 2021-08-01 DIAGNOSIS — E1140 Type 2 diabetes mellitus with diabetic neuropathy, unspecified: Secondary | ICD-10-CM | POA: Diagnosis present

## 2021-08-01 DIAGNOSIS — Z7982 Long term (current) use of aspirin: Secondary | ICD-10-CM

## 2021-08-01 DIAGNOSIS — Z8249 Family history of ischemic heart disease and other diseases of the circulatory system: Secondary | ICD-10-CM

## 2021-08-01 DIAGNOSIS — D120 Benign neoplasm of cecum: Secondary | ICD-10-CM | POA: Diagnosis not present

## 2021-08-01 DIAGNOSIS — K3189 Other diseases of stomach and duodenum: Secondary | ICD-10-CM | POA: Diagnosis not present

## 2021-08-01 DIAGNOSIS — K269 Duodenal ulcer, unspecified as acute or chronic, without hemorrhage or perforation: Secondary | ICD-10-CM | POA: Diagnosis present

## 2021-08-01 DIAGNOSIS — K5900 Constipation, unspecified: Secondary | ICD-10-CM | POA: Diagnosis not present

## 2021-08-01 DIAGNOSIS — K259 Gastric ulcer, unspecified as acute or chronic, without hemorrhage or perforation: Secondary | ICD-10-CM | POA: Diagnosis present

## 2021-08-01 DIAGNOSIS — I890 Lymphedema, not elsewhere classified: Secondary | ICD-10-CM | POA: Diagnosis not present

## 2021-08-01 DIAGNOSIS — L0390 Cellulitis, unspecified: Secondary | ICD-10-CM | POA: Diagnosis not present

## 2021-08-01 DIAGNOSIS — K5190 Ulcerative colitis, unspecified, without complications: Secondary | ICD-10-CM | POA: Diagnosis not present

## 2021-08-01 DIAGNOSIS — I442 Atrioventricular block, complete: Secondary | ICD-10-CM | POA: Diagnosis present

## 2021-08-01 DIAGNOSIS — E876 Hypokalemia: Secondary | ICD-10-CM | POA: Diagnosis not present

## 2021-08-01 DIAGNOSIS — L03115 Cellulitis of right lower limb: Secondary | ICD-10-CM | POA: Diagnosis not present

## 2021-08-01 DIAGNOSIS — E785 Hyperlipidemia, unspecified: Secondary | ICD-10-CM | POA: Diagnosis present

## 2021-08-01 DIAGNOSIS — I110 Hypertensive heart disease with heart failure: Secondary | ICD-10-CM | POA: Diagnosis not present

## 2021-08-01 DIAGNOSIS — E11621 Type 2 diabetes mellitus with foot ulcer: Secondary | ICD-10-CM | POA: Diagnosis not present

## 2021-08-01 DIAGNOSIS — K6389 Other specified diseases of intestine: Secondary | ICD-10-CM | POA: Diagnosis not present

## 2021-08-01 DIAGNOSIS — M86271 Subacute osteomyelitis, right ankle and foot: Secondary | ICD-10-CM | POA: Diagnosis not present

## 2021-08-01 DIAGNOSIS — K319 Disease of stomach and duodenum, unspecified: Secondary | ICD-10-CM | POA: Diagnosis not present

## 2021-08-01 DIAGNOSIS — I2510 Atherosclerotic heart disease of native coronary artery without angina pectoris: Secondary | ICD-10-CM | POA: Diagnosis not present

## 2021-08-01 DIAGNOSIS — Z87891 Personal history of nicotine dependence: Secondary | ICD-10-CM

## 2021-08-01 DIAGNOSIS — L97509 Non-pressure chronic ulcer of other part of unspecified foot with unspecified severity: Secondary | ICD-10-CM | POA: Diagnosis not present

## 2021-08-01 DIAGNOSIS — I739 Peripheral vascular disease, unspecified: Secondary | ICD-10-CM | POA: Diagnosis not present

## 2021-08-01 DIAGNOSIS — L97519 Non-pressure chronic ulcer of other part of right foot with unspecified severity: Secondary | ICD-10-CM | POA: Diagnosis present

## 2021-08-01 DIAGNOSIS — E11628 Type 2 diabetes mellitus with other skin complications: Secondary | ICD-10-CM | POA: Diagnosis not present

## 2021-08-01 DIAGNOSIS — I10 Essential (primary) hypertension: Secondary | ICD-10-CM | POA: Diagnosis not present

## 2021-08-01 DIAGNOSIS — K279 Peptic ulcer, site unspecified, unspecified as acute or chronic, without hemorrhage or perforation: Secondary | ICD-10-CM | POA: Diagnosis not present

## 2021-08-01 DIAGNOSIS — I5032 Chronic diastolic (congestive) heart failure: Secondary | ICD-10-CM | POA: Diagnosis not present

## 2021-08-01 DIAGNOSIS — R531 Weakness: Secondary | ICD-10-CM | POA: Diagnosis not present

## 2021-08-01 DIAGNOSIS — E8809 Other disorders of plasma-protein metabolism, not elsewhere classified: Secondary | ICD-10-CM | POA: Diagnosis not present

## 2021-08-01 DIAGNOSIS — G8918 Other acute postprocedural pain: Secondary | ICD-10-CM | POA: Diagnosis not present

## 2021-08-01 DIAGNOSIS — E119 Type 2 diabetes mellitus without complications: Secondary | ICD-10-CM

## 2021-08-01 DIAGNOSIS — R52 Pain, unspecified: Secondary | ICD-10-CM | POA: Diagnosis not present

## 2021-08-01 DIAGNOSIS — I252 Old myocardial infarction: Secondary | ICD-10-CM | POA: Diagnosis not present

## 2021-08-01 DIAGNOSIS — K633 Ulcer of intestine: Secondary | ICD-10-CM | POA: Diagnosis not present

## 2021-08-01 DIAGNOSIS — K635 Polyp of colon: Secondary | ICD-10-CM | POA: Diagnosis not present

## 2021-08-01 DIAGNOSIS — D509 Iron deficiency anemia, unspecified: Secondary | ICD-10-CM | POA: Diagnosis present

## 2021-08-01 DIAGNOSIS — Z950 Presence of cardiac pacemaker: Secondary | ICD-10-CM

## 2021-08-01 DIAGNOSIS — I70202 Unspecified atherosclerosis of native arteries of extremities, left leg: Secondary | ICD-10-CM | POA: Diagnosis present

## 2021-08-01 DIAGNOSIS — Z7984 Long term (current) use of oral hypoglycemic drugs: Secondary | ICD-10-CM | POA: Diagnosis not present

## 2021-08-01 DIAGNOSIS — K5730 Diverticulosis of large intestine without perforation or abscess without bleeding: Secondary | ICD-10-CM | POA: Diagnosis present

## 2021-08-01 DIAGNOSIS — L089 Local infection of the skin and subcutaneous tissue, unspecified: Secondary | ICD-10-CM | POA: Diagnosis present

## 2021-08-01 DIAGNOSIS — E1151 Type 2 diabetes mellitus with diabetic peripheral angiopathy without gangrene: Secondary | ICD-10-CM | POA: Diagnosis present

## 2021-08-01 DIAGNOSIS — L97514 Non-pressure chronic ulcer of other part of right foot with necrosis of bone: Secondary | ICD-10-CM | POA: Diagnosis not present

## 2021-08-01 DIAGNOSIS — E1169 Type 2 diabetes mellitus with other specified complication: Secondary | ICD-10-CM | POA: Diagnosis present

## 2021-08-01 DIAGNOSIS — K529 Noninfective gastroenteritis and colitis, unspecified: Secondary | ICD-10-CM | POA: Diagnosis not present

## 2021-08-01 DIAGNOSIS — Z89511 Acquired absence of right leg below knee: Secondary | ICD-10-CM

## 2021-08-01 DIAGNOSIS — M869 Osteomyelitis, unspecified: Secondary | ICD-10-CM | POA: Diagnosis not present

## 2021-08-01 DIAGNOSIS — J449 Chronic obstructive pulmonary disease, unspecified: Secondary | ICD-10-CM | POA: Diagnosis not present

## 2021-08-01 DIAGNOSIS — K649 Unspecified hemorrhoids: Secondary | ICD-10-CM | POA: Diagnosis present

## 2021-08-01 DIAGNOSIS — D62 Acute posthemorrhagic anemia: Secondary | ICD-10-CM | POA: Diagnosis not present

## 2021-08-01 DIAGNOSIS — Z743 Need for continuous supervision: Secondary | ICD-10-CM | POA: Diagnosis not present

## 2021-08-01 DIAGNOSIS — E1165 Type 2 diabetes mellitus with hyperglycemia: Secondary | ICD-10-CM | POA: Diagnosis not present

## 2021-08-01 DIAGNOSIS — E669 Obesity, unspecified: Secondary | ICD-10-CM | POA: Diagnosis present

## 2021-08-01 DIAGNOSIS — Z4781 Encounter for orthopedic aftercare following surgical amputation: Secondary | ICD-10-CM | POA: Diagnosis not present

## 2021-08-01 DIAGNOSIS — T797XXA Traumatic subcutaneous emphysema, initial encounter: Secondary | ICD-10-CM | POA: Diagnosis not present

## 2021-08-01 DIAGNOSIS — L98499 Non-pressure chronic ulcer of skin of other sites with unspecified severity: Secondary | ICD-10-CM | POA: Diagnosis not present

## 2021-08-01 DIAGNOSIS — Z9861 Coronary angioplasty status: Secondary | ICD-10-CM

## 2021-08-01 DIAGNOSIS — S91301A Unspecified open wound, right foot, initial encounter: Secondary | ICD-10-CM | POA: Diagnosis not present

## 2021-08-01 DIAGNOSIS — M7989 Other specified soft tissue disorders: Secondary | ICD-10-CM | POA: Diagnosis not present

## 2021-08-01 LAB — CBC WITH DIFFERENTIAL/PLATELET
Abs Immature Granulocytes: 0.05 10*3/uL (ref 0.00–0.07)
Basophils Absolute: 0 10*3/uL (ref 0.0–0.1)
Basophils Relative: 0 %
Eosinophils Absolute: 0.1 10*3/uL (ref 0.0–0.5)
Eosinophils Relative: 1 %
HCT: 26.7 % — ABNORMAL LOW (ref 39.0–52.0)
Hemoglobin: 8.2 g/dL — ABNORMAL LOW (ref 13.0–17.0)
Immature Granulocytes: 0 %
Lymphocytes Relative: 12 %
Lymphs Abs: 1.3 10*3/uL (ref 0.7–4.0)
MCH: 28.1 pg (ref 26.0–34.0)
MCHC: 30.7 g/dL (ref 30.0–36.0)
MCV: 91.4 fL (ref 80.0–100.0)
Monocytes Absolute: 1.6 10*3/uL — ABNORMAL HIGH (ref 0.1–1.0)
Monocytes Relative: 14 %
Neutro Abs: 8.3 10*3/uL — ABNORMAL HIGH (ref 1.7–7.7)
Neutrophils Relative %: 73 %
Platelets: 254 10*3/uL (ref 150–400)
RBC: 2.92 MIL/uL — ABNORMAL LOW (ref 4.22–5.81)
RDW: 16 % — ABNORMAL HIGH (ref 11.5–15.5)
WBC: 11.3 10*3/uL — ABNORMAL HIGH (ref 4.0–10.5)
nRBC: 0 % (ref 0.0–0.2)

## 2021-08-01 LAB — BASIC METABOLIC PANEL
Anion gap: 8 (ref 5–15)
BUN: 18 mg/dL (ref 8–23)
CO2: 35 mmol/L — ABNORMAL HIGH (ref 22–32)
Calcium: 8.5 mg/dL — ABNORMAL LOW (ref 8.9–10.3)
Chloride: 88 mmol/L — ABNORMAL LOW (ref 98–111)
Creatinine, Ser: 0.99 mg/dL (ref 0.61–1.24)
GFR, Estimated: >60 mL/min (ref >60)
Glucose, Bld: 120 mg/dL — ABNORMAL HIGH (ref 70–99)
Potassium: 3.9 mmol/L (ref 3.5–5.1)
Sodium: 131 mmol/L — ABNORMAL LOW (ref 135–145)

## 2021-08-01 LAB — LACTIC ACID, PLASMA: Lactic Acid, Venous: 1.7 mmol/L (ref 0.5–1.9)

## 2021-08-01 MED ORDER — VANCOMYCIN HCL 1750 MG/350ML IV SOLN
1750.0000 mg | INTRAVENOUS | Status: DC
  Filled 2021-08-01: qty 350

## 2021-08-01 MED ORDER — VANCOMYCIN HCL 2000 MG/400ML IV SOLN
2000.0000 mg | Freq: Once | INTRAVENOUS | Status: AC
  Administered 2021-08-01: 2000 mg via INTRAVENOUS
  Filled 2021-08-01: qty 400

## 2021-08-01 MED ORDER — IOHEXOL 350 MG/ML SOLN
80.0000 mL | Freq: Once | INTRAVENOUS | Status: AC | PRN
  Administered 2021-08-01: 80 mL via INTRAVENOUS

## 2021-08-01 MED ORDER — CLINDAMYCIN PHOSPHATE 900 MG/50ML IV SOLN
900.0000 mg | Freq: Three times a day (TID) | INTRAVENOUS | Status: AC
  Administered 2021-08-01 – 2021-08-04 (×11): 900 mg via INTRAVENOUS
  Filled 2021-08-01 (×12): qty 50

## 2021-08-01 MED ORDER — PIPERACILLIN-TAZOBACTAM 3.375 G IVPB 30 MIN
3.3750 g | Freq: Once | INTRAVENOUS | Status: AC
  Administered 2021-08-01: 3.375 g via INTRAVENOUS
  Filled 2021-08-01: qty 50

## 2021-08-01 MED ORDER — SODIUM CHLORIDE 0.9 % IV SOLN
2.0000 g | Freq: Three times a day (TID) | INTRAVENOUS | Status: DC
  Administered 2021-08-01 – 2021-08-02 (×2): 2 g via INTRAVENOUS
  Filled 2021-08-01 (×2): qty 2

## 2021-08-01 NOTE — Progress Notes (Signed)
Pharmacy Antibiotic Note  Nathan Miller is a 70 y.o. male with chronic foot wound admitted from wound center on 08/01/2021 with subjective fevers, increased red and swollen right food wound.  Xray showed possible gas forming organism. Pharmacy has been consulted for vancomycin, cefepime and clindamycin dosing.  Plan: Cefepime 2g IV q8h Clindamycin 900mg  IV q8h Vancomycin 2g IV x 1, then 1750mg  IV q24h for estimated AUC 472, SCr used 0.99, Vd 0.5L/kg Consider vancomycin levels as needed Follow up renal function & cultures, de-escalate as appropriate   Temp (24hrs), Avg:98.3 F (36.8 C), Min:98.3 F (36.8 C), Max:98.3 F (36.8 C)  Recent Labs  Lab 08/01/21 1023  WBC 11.3*  CREATININE 0.99    CrCl cannot be calculated (Unknown ideal weight.).    No Known Allergies  Antimicrobials this admission: 10/3 Zosyn x 1 10/3 Vancomycin >> 10/3 Cefepime >> 10/3 Clindamycin >>  Dose adjustments this admission:  Microbiology results: 10/3 BCx: 10/3 COVID:  Thank you for allowing pharmacy to be a part of this patient's care.  Peggyann Juba, PharmD, BCPS Pharmacy: [phone] 08/01/2021 1:51 PM

## 2021-08-01 NOTE — ED Triage Notes (Signed)
Pt arrived via POV, sent from wound clinic, wound on right foot, worsening drainage, concerned for worsening infection.

## 2021-08-01 NOTE — H&P (Signed)
History and Physical    Nathan Miller OZH:[id number] DOB: Mar 19, 1951 DOA: 08/01/2021  PCP: [facility]  Patient coming from: Home   Chief Complaint: right foot wound  HPI: Nathan Miller is a 70 y.o. male with medical history significant of CAD, DM2, chronic lymphedema, CHB s/p PPM, chronic foot wound. Presenting right foot wound. Has a chronic right foot wound that is followed by the wound care center. For the last several weeks, he's been in using dankin's solution to keep it clean. He has had a pocket of purulent fluid drain from it during this time. He ran out of dankin's solution a few days ago. Over the last 2 days, his foot has become more swollen and red. He has had subjective fevers. His wife has been dry packing the wound for the last 2 days. He had an appt with the wound care center today. Upon seeing the condition of his foot, it was recommended that he come to the ED immediately for care. He denies any other aggravating or alleviating factors.  ED Course: XR showed wound with possible gas forming organism. He was started on zosyn. [facility] was called for admission.   Review of Systems:  Denies CP, palpitations, dyspnea, N/V/D. Review of systems is otherwise negative for all not mentioned in HPI.   PMHx Past Medical History:  Diagnosis Date   CAD (coronary artery disease)    NSTEMI in 2001. Cardiac cath showed: LAD: 40% proximal, LCX: 70% mid, RCA thrombotic occlusion in mid segment. s/p PCI and 2 overlapped BMSs to RCA.    CHF (congestive heart failure) (HCC)    Diabetes mellitus    Hypertension    Osteomyelitis (HCC)    Presence of permanent cardiac pacemaker     PSHx Past Surgical History:  Procedure Laterality Date   BUBBLE STUDY  04/07/2021   Procedure: BUBBLE STUDY;  Surgeon: Parke Poisson, MD;  Location: [facility] ENDOSCOPY;  Service: Cardiology;;   CARDIAC CATHETERIZATION     CORONARY ANGIOPLASTY     PERMANENT PACEMAKER INSERTION N/A  01/22/2015   MDT Advisa pacemaker implanted for complete heart block by Dr Johney Frame   TEE WITHOUT CARDIOVERSION N/A 04/07/2021   Procedure: TRANSESOPHAGEAL ECHOCARDIOGRAM (TEE);  Surgeon: Parke Poisson, MD;  Location: [facility] ENDOSCOPY;  Service: Cardiology;  Laterality: N/A;   TEMPORARY PACEMAKER INSERTION N/A 01/21/2015   Procedure: TEMPORARY PACEMAKER INSERTION;  Surgeon: Marykay Lex, MD;  Location: [facility] CATH LAB;  Service: Cardiovascular;  Laterality: N/A;    SocHx  reports that he quit smoking about 13 years ago. His smoking use included cigarettes. He started smoking about 52 years ago. He has a 12.00 pack-year smoking history. His smokeless tobacco use includes snuff. He reports current alcohol use of about 42.0 standard drinks per week. He reports that he does not use drugs.  No Known Allergies  FamHx Family History  Problem Relation Age of Onset   Heart disease Father    COPD Father    Heart disease Paternal Grandfather     Prior to Admission medications   Medication Sig Start Date End Date Taking? Authorizing Provider  aspirin EC 81 MG tablet Take 1 tablet (81 mg total) by mouth daily. 01/25/15   Renae Fickle, MD  atorvastatin (LIPITOR) 80 MG tablet Take 1 tablet (80 mg total) by mouth daily. 05/04/17   Allred, Fayrene Fearing, MD  carvedilol (COREG) 25 MG tablet Take 1 tablet (25 mg total) by mouth 2 (two) times daily. 05/04/17  Hillis Range, MD  furosemide (LASIX) 40 MG tablet Take 1 tablet (40 mg total) by mouth daily as needed for fluid or edema (If weight gain >3-5 pounds in 24hrs.). 04/08/21 05/08/21  Amin, Loura Halt, MD  lisinopril (ZESTRIL) 40 MG tablet Take 1 tablet (40 mg total) by mouth daily. 06/04/20   Allred, Fayrene Fearing, MD  metFORMIN (GLUCOPHAGE) 500 MG tablet Take 1 tablet (500 mg total) by mouth 2 (two) times daily with a meal. 01/25/15   Renae Fickle, MD  Multiple Vitamins-Minerals (CENTRUM ADULTS) TABS Take 1 tablet by mouth daily.    [provider]  nitroGLYCERIN  (NITROSTAT) 0.4 MG SL tablet Place 1 tablet (0.4 mg total) under the tongue every 5 (five) minutes as needed for chest pain. 06/03/21   Hillis Range, MD    Physical Exam: Vitals:   08/01/21 0943  BP: (!) 120/59  Pulse: (!) 52  Resp: 16  Temp: 98.3 F (36.8 C)  TempSrc: Oral  SpO2: 91%    General: 70 y.o. male resting in bed in NAD Eyes: PERRL, normal sclera ENMT: Nares patent w/o discharge, orophaynx clear, dentition normal, ears w/o discharge/lesions/ulcers Neck: Supple, trachea midline Cardiovascular: RRR, +S1, S2, no m/g/r, equal pulses throughout Respiratory: CTABL, no w/r/r, normal WOB GI: BS+, obese, NDNT, no masses noted, no organomegaly noted MSK: No b/l edema; right chronic foot wound w/ purulence; large pocket of air posterior and superior to heal Skin: No rashes, bruises, ulcerations noted Neuro: A&O x 3, no focal deficits Psyc: Appropriate interaction and affect, calm/cooperative  Labs on Admission: I have personally reviewed following labs and imaging studies  CBC: Recent Labs  Lab 08/01/21 1023  WBC 11.3*  NEUTROABS 8.3*  HGB 8.2*  HCT 26.7*  MCV 91.4  PLT 254   Basic Metabolic Panel: Recent Labs  Lab 08/01/21 1023  NA 131*  K 3.9  CL 88*  CO2 35*  GLUCOSE 120*  BUN 18  CREATININE 0.99  CALCIUM 8.5*   GFR: CrCl cannot be calculated (Unknown ideal weight.). Liver Function Tests: No results for input(s): AST, ALT, ALKPHOS, BILITOT, PROT, ALBUMIN in the last 168 hours. No results for input(s): LIPASE, AMYLASE in the last 168 hours. No results for input(s): AMMONIA in the last 168 hours. Coagulation Profile: No results for input(s): INR, PROTIME in the last 168 hours. Cardiac Enzymes: No results for input(s): CKTOTAL, CKMB, CKMBINDEX, TROPONINI in the last 168 hours. BNP (last 3 results) No results for input(s): PROBNP in the last 8760 hours. HbA1C: No results for input(s): HGBA1C in the last 72 hours. CBG: No results for input(s): GLUCAP  in the last 168 hours. Lipid Profile: No results for input(s): CHOL, HDL, LDLCALC, TRIG, CHOLHDL, LDLDIRECT in the last 72 hours. Thyroid Function Tests: No results for input(s): TSH, T4TOTAL, FREET4, T3FREE, THYROIDAB in the last 72 hours. Anemia Panel: No results for input(s): VITAMINB12, FOLATE, FERRITIN, TIBC, IRON, RETICCTPCT in the last 72 hours. Urine analysis:    Component Value Date/Time   COLORURINE AMBER (A) 04/04/2021 1338   APPEARANCEUR CLOUDY (A) 04/04/2021 1338   LABSPEC 1.021 04/04/2021 1338   PHURINE 5.0 04/04/2021 1338   GLUCOSEU NEGATIVE 04/04/2021 1338   HGBUR NEGATIVE 04/04/2021 1338   BILIRUBINUR NEGATIVE 04/04/2021 1338   KETONESUR NEGATIVE 04/04/2021 1338   PROTEINUR 30 (A) 04/04/2021 1338   UROBILINOGEN 1.0 06/13/2015 2104   NITRITE NEGATIVE 04/04/2021 1338   LEUKOCYTESUR NEGATIVE 04/04/2021 1338    Radiological Exams on Admission: DG Foot Complete Right  Result Date:  08/01/2021 CLINICAL DATA:  Purulent drainage from RIGHT foot wound, increased swelling and redness; told at Wound Care Center to go to ED for RIGHT foot infection, history of chronic osteomyelitis EXAM: RIGHT FOOT COMPLETE - 3+ VIEW COMPARISON:  04/05/2021 FINDINGS: Osseous demineralization. Marked soft tissue swelling. Foci of soft tissue gas are identified at the posterior aspect of the RIGHT ankle extending into plantar aspect of foot laterally. Findings consistent with either penetrating wound or soft tissue infection by gas-forming organism. Extensive degenerative changes deformity throughout the tarsals to MTP joints. Bone destruction at proximal RIGHT fifth meta tarsal and distal lateral cuboid again seen. Nonunion of old fracture at lateral base of proximal phalanx great toe. No acute fracture or bone destruction definitely visualized. IMPRESSION: Extensive soft tissue swelling with foci of soft tissue gas at the lateral heel and posterior ankle either representing penetrating wound or  infection by a gas-forming organism. Extensive degenerative changes and deformities throughout RIGHT foot similar to previous exam. No definite acute fracture or new bone destruction identified. Findings called to [facility] on 08/01/2021 at 1104 hours. Electronically Signed   By: Ulyses Southward M.D.   On: 08/01/2021 10:53    EKG: None obtained in ED  Assessment/Plan Right diabetic foot wound     - admit inpt, tele @ [facility]     - continue zosyn, add vanc, clinda for now     - XR foot: Extensive soft tissue swelling with foci of soft tissue gas at the lateral heel and posterior ankle either representing penetrating wound or infection by a gas-forming organism.     - he does not have the traditional crepitus; but has palpable pocket of air below the skin     - checking CT for fasciitis     - spoke with vasc surg; rec'd ABI and admission to [facility]; rec'd ortho involvement; have consulted [facility]; ABI ordered  DM2      - A1c, SSI, DM diet, glucose checks  HTN     - continue home regimen  CAD Complete Heart Block s/p PPM Chronic HFpEF     - continue home regimen  HLD     - continue statin  Morbid obesity     - counsel on diet, lifestyle changes  Tobacco abuse     - counseled against further use  COPD     - continue spiriva; PRN albuterol  DVT prophylaxis: lovenox  Code Status: FULL  Family Communication: w/ wife at [employer] called: Vascular surgery, ortho   Status is: Inpatient  Remains inpatient appropriate because:Inpatient level of care appropriate due to severity of illness  Dispo: The patient is from: Home              Anticipated d/c is to: Home              Patient currently is not medically stable to d/c.   Difficult to place patient No  Time spent coordinating admission: 75 minutes  Sofie Schendel A Deontay Ladnier DO [facility]  If 7PM-7AM, please contact night-coverage [email or web address]  08/01/2021, 12:29 PM

## 2021-08-01 NOTE — ED Provider Notes (Signed)
Emergency Medicine Provider Triage Evaluation Note  Nathan Miller , a 70 y.o. male  was evaluated in triage.  Pt complains of possible wound infection in R foot. History of chronic osteo. Wound clinic today concerned redness and pus was worsening. Possible fevers over the weekend.  Review of Systems  Positive: Foot redness Negative: numbness  Physical Exam  BP (!) 120/59 (BP Location: Right Arm)   Pulse (!) 52   Temp 98.3 F (36.8 C) (Oral)   Resp 16   SpO2 91%  Gen:   Awake, no distress   Resp:  Normal effort  MSK:   Moves extremities without difficulty  Other:  Erythema of R foot  Medical Decision Making  Medically screening exam initiated at 9:59 AM.  Appropriate orders placed.  ZACCHEUS EDMISTER was informed that the remainder of the evaluation will be completed by another provider, this initial triage assessment does not replace that evaluation, and the importance of remaining in the ED until their evaluation is complete.  Labs and xray ordered   Delia Heady, PA-C 08/01/21 1000    Valarie Merino, MD 08/01/21 [phone]

## 2021-08-01 NOTE — ED Provider Notes (Signed)
[facility] DEPT Provider Note   CSN: [id number] Arrival date & time: 08/01/21  3662     History Chief Complaint  Patient presents with   Wound Infection    Nathan Miller is a 70 y.o. male.  70 year old male who presents with 3 days of increased right foot as well as shin erythema.  Has chronic wound to that area.  Subjective chills but no fever.  Discomfort has been getting worse.  He does use a cane and favors the right foot when he walks.  Does have a prior history of trauma to that right foot as well as type 2 diabetes.  No treatment use prior to arrival      Past Medical History:  Diagnosis Date   CAD (coronary artery disease)    NSTEMI in 2001. Cardiac cath showed: LAD: 40% proximal, LCX: 70% mid, RCA thrombotic occlusion in mid segment. s/p PCI and 2 overlapped BMSs to RCA.    CHF (congestive heart failure) (HCC)    Diabetes mellitus    Hypertension    Osteomyelitis ([place])    Presence of permanent cardiac pacemaker     Patient Active Problem List   Diagnosis Date Noted   Bacteremia 04/20/2021   Acute respiratory failure with hypoxia (HCC) 03/31/2021   Obesity with body mass index (BMI) of 30.0 to 39.9 03/31/2021   Elevated d-dimer 03/31/2021   Elevated brain natriuretic peptide (BNP) level 03/31/2021   Cardiac pacemaker in situ 94/76/5465   Umbilical hernia 03/54/6568   Obesity 01/29/2016   Marijuana use 01/29/2016   Diabetes type 2, controlled ([place]) 01/29/2016   Diverticulitis of intestine with perforation without bleeding    Blood poisoning    Diverticulitis of intestine with perforation 06/13/2015   Diverticulitis 06/13/2015   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   Faintness    Bradycardia 01/21/2015   Syncope 01/21/2015   Complete heart block ([place]) 01/21/2015   Right foot ulcer ([place]) 01/21/2015   Diabetes mellitus type 2, uncontrolled 01/21/2015   Hyperlipidemia LDL goal < 100 02/11/2013   Tobacco use disorder  02/11/2013   Anxiety state, unspecified 02/11/2013   Essential hypertension 05/10/2011   Papule 03/20/2011   Osteomyelitis ([place])    CAD S/P percutaneous coronary angioplasty; bms X 2 to RCA 2001 01/22/2000    Class: Diagnosis of    Past Surgical History:  Procedure Laterality Date   BUBBLE STUDY  04/07/2021   Procedure: BUBBLE STUDY;  Surgeon: Elouise Munroe, MD;  Location: [place];  Service: Cardiology;;   CARDIAC CATHETERIZATION     CORONARY ANGIOPLASTY     PERMANENT PACEMAKER INSERTION N/A 01/22/2015   MDT Advisa pacemaker implanted for complete heart block by Dr Rayann Heman   TEE WITHOUT CARDIOVERSION N/A 04/07/2021   Procedure: TRANSESOPHAGEAL ECHOCARDIOGRAM (TEE);  Surgeon: Elouise Munroe, MD;  Location: [place];  Service: Cardiology;  Laterality: N/A;   TEMPORARY PACEMAKER INSERTION N/A 01/21/2015   Procedure: TEMPORARY PACEMAKER INSERTION;  Surgeon: Leonie Man, MD;  Location: [facility] CATH LAB;  Service: Cardiovascular;  Laterality: N/A;       Family History  Problem Relation Age of Onset   Heart disease Father    COPD Father    Heart disease Paternal Grandfather     Social History   Tobacco Use   Smoking status: Former    Packs/day: 0.30    Years: 40.00    Pack years: 12.00    Types: Cigarettes    Start date: 10/16/1968  Quit date: 10/17/2007    Years since quitting: 13.8   Smokeless tobacco: Current    Types: Snuff  Vaping Use   Vaping Use: Never used  Substance Use Topics   Alcohol use: Yes    Alcohol/week: 42.0 standard drinks    Types: 42 Cans of beer per week    Comment: beer   Drug use: No    Home Medications Prior to Admission medications   Medication Sig Start Date End Date Taking? Authorizing Provider  aspirin EC 81 MG tablet Take 1 tablet (81 mg total) by mouth daily. 01/25/15   Janece Canterbury, MD  atorvastatin (LIPITOR) 80 MG tablet Take 1 tablet (80 mg total) by mouth daily. 05/04/17   Allred, Jeneen Rinks, MD  carvedilol (COREG) 25 MG  tablet Take 1 tablet (25 mg total) by mouth 2 (two) times daily. 05/04/17   Allred, Jeneen Rinks, MD  furosemide (LASIX) 40 MG tablet Take 1 tablet (40 mg total) by mouth daily as needed for fluid or edema (If weight gain >3-5 pounds in 24hrs.). 04/08/21 05/08/21  Amin, Jeanella Flattery, MD  lisinopril (ZESTRIL) 40 MG tablet Take 1 tablet (40 mg total) by mouth daily. 06/04/20   Allred, Jeneen Rinks, MD  metFORMIN (GLUCOPHAGE) 500 MG tablet Take 1 tablet (500 mg total) by mouth 2 (two) times daily with a meal. 01/25/15   Janece Canterbury, MD  Multiple Vitamins-Minerals (CENTRUM ADULTS) TABS Take 1 tablet by mouth daily.    [provider]  nitroGLYCERIN (NITROSTAT) 0.4 MG SL tablet Place 1 tablet (0.4 mg total) under the tongue every 5 (five) minutes as needed for chest pain. 06/03/21   Thompson Grayer, MD    Allergies    Patient has no known allergies.  Review of Systems   Review of Systems  All other systems reviewed and are negative.  Physical Exam Updated Vital Signs BP (!) 120/59 (BP Location: Right Arm)   Pulse (!) 52   Temp 98.3 F (36.8 C) (Oral)   Resp 16   SpO2 91%   Physical Exam Vitals and nursing note reviewed.  Constitutional:      General: He is not in acute distress.    Appearance: Normal appearance. He is well-developed. He is not toxic-appearing.  HENT:     Head: Normocephalic and atraumatic.  Eyes:     General: Lids are normal.     Conjunctiva/sclera: Conjunctivae normal.     Pupils: Pupils are equal, round, and reactive to light.  Neck:     Thyroid: No thyroid mass.     Trachea: No tracheal deviation.  Cardiovascular:     Rate and Rhythm: Normal rate and regular rhythm.     Heart sounds: Normal heart sounds. No murmur heard.   No gallop.  Pulmonary:     Effort: Pulmonary effort is normal. No respiratory distress.     Breath sounds: Normal breath sounds. No stridor. No decreased breath sounds, wheezing, rhonchi or rales.  Abdominal:     General: There is no distension.      Palpations: Abdomen is soft.     Tenderness: There is no abdominal tenderness. There is no rebound.  Musculoskeletal:        General: No tenderness. Normal range of motion.     Cervical back: Normal range of motion and neck supple.       Legs:  Skin:    General: Skin is warm and dry.     Findings: No abrasion or rash.  Neurological:  Mental Status: He is alert and oriented to person, place, and time. Mental status is at baseline.     GCS: GCS eye subscore is 4. GCS verbal subscore is 5. GCS motor subscore is 6.     Cranial Nerves: Cranial nerves are intact. No cranial nerve deficit.     Sensory: No sensory deficit.     Motor: Motor function is intact.  Psychiatric:        Attention and Perception: Attention normal.        Speech: Speech normal.        Behavior: Behavior normal.    ED Results / Procedures / Treatments   Labs (all labs ordered are listed, but only abnormal results are displayed) Labs Reviewed  BASIC METABOLIC PANEL - Abnormal; Notable for the following components:      Result Value   Sodium 131 (*)    Chloride 88 (*)    CO2 35 (*)    Glucose, Bld 120 (*)    Calcium 8.5 (*)    All other components within normal limits  CBC WITH DIFFERENTIAL/PLATELET - Abnormal; Notable for the following components:   WBC 11.3 (*)    RBC 2.92 (*)    Hemoglobin 8.2 (*)    HCT 26.7 (*)    RDW 16.0 (*)    Neutro Abs 8.3 (*)    Monocytes Absolute 1.6 (*)    All other components within normal limits  CULTURE, BLOOD (ROUTINE X 2)  CULTURE, BLOOD (ROUTINE X 2)  LACTIC ACID, PLASMA  LACTIC ACID, PLASMA    EKG None  Radiology DG Foot Complete Right  Result Date: 08/01/2021 CLINICAL DATA:  Purulent drainage from RIGHT foot wound, increased swelling and redness; told at [place] to go to ED for RIGHT foot infection, history of chronic osteomyelitis EXAM: RIGHT FOOT COMPLETE - 3+ VIEW COMPARISON:  04/05/2021 FINDINGS: Osseous demineralization. Marked soft tissue  swelling. Foci of soft tissue gas are identified at the posterior aspect of the RIGHT ankle extending into plantar aspect of foot laterally. Findings consistent with either penetrating wound or soft tissue infection by gas-forming organism. Extensive degenerative changes deformity throughout the tarsals to MTP joints. Bone destruction at proximal RIGHT fifth meta tarsal and distal lateral cuboid again seen. Nonunion of old fracture at lateral base of proximal phalanx great toe. No acute fracture or bone destruction definitely visualized. IMPRESSION: Extensive soft tissue swelling with foci of soft tissue gas at the lateral heel and posterior ankle either representing penetrating wound or infection by a gas-forming organism. Extensive degenerative changes and deformities throughout RIGHT foot similar to previous exam. No definite acute fracture or new bone destruction identified. Findings called to [facility] on 08/01/2021 at 1104 hours. Electronically Signed   By: Lavonia Dana M.D.   On: 08/01/2021 10:53    Procedures Procedures   Medications Ordered in ED Medications  piperacillin-tazobactam (ZOSYN) IVPB 3.375 g (has no administration in time range)    ED Course  I have reviewed the triage vital signs and the nursing notes.  Pertinent labs & imaging results that were available during my care of the patient were reviewed by me and considered in my medical decision making (see chart for details).    MDM Rules/Calculators/A&P                           Patient's x-rays noted here and started on IV antibiotics.  Mild leukocytosis noted.  Has  had drop of his hemoglobin but denies any history of GI bleeding we will order stool occult card.  Patient will require admission Final Clinical Impression(s) / ED Diagnoses Final diagnoses:  None    Rx / DC Orders ED Discharge Orders     None        Lacretia Leigh, MD 08/01/21 1208

## 2021-08-01 NOTE — Progress Notes (Signed)
ABI's have been completed. Preliminary results can be found in CV Proc through chart review.   08/01/21 2:16 PM Nathan Miller RVT

## 2021-08-01 NOTE — ED Notes (Signed)
Pt refused POC occult collection

## 2021-08-01 NOTE — Consult Note (Signed)
Hospital Consult    Reason for Consult:  right foot wound Referring Physician:  Dr. Marylyn Ishihara MRN #:  [id number]  History of Present Illness: This is a 70 y.o. 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 He states this wound has probably been present for at least a decade and he has recently been following in the wound care center with dakins packing. He has had worsened drainage from the foot in the past few days and was recommended for ED evaluation. He has also had fever in the past couple days. He thinks he had vascular evaluation several years ago at [facility] or [facility] but there is no record of this. CT and ABI's performed today.  Past Medical History:  Diagnosis Date   CAD (coronary artery disease)    NSTEMI in 2001. Cardiac cath showed: LAD: 40% proximal, LCX: 70% mid, RCA thrombotic occlusion in mid segment. s/p PCI and 2 overlapped BMSs to RCA.    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 Authorizing Provider  acetaminophen  (TYLENOL) 500 MG tablet Take 500 mg by mouth every 6 (six) hours as needed for mild pain or fever.   Yes [provider]  albuterol (VENTOLIN HFA) 108 (90 Base) MCG/ACT inhaler Inhale 1-2 puffs into the lungs every 6 (six) hours as needed for wheezing or shortness of breath. 07/12/21  Yes [provider]  aspirin EC 81 MG tablet Take 1 tablet (81 mg total) by mouth daily. 01/25/15  Yes Short, Noah Delaine, MD  atorvastatin (LIPITOR) 80 MG tablet Take 1 tablet (80 mg total) by mouth daily. 05/04/17  Yes Allred, Jeneen Rinks, MD  carvedilol (COREG) 25 MG tablet Take 1 tablet (25 mg total) by mouth 2 (two) times daily. 05/04/17  Yes Allred, Jeneen Rinks, MD  metFORMIN (GLUCOPHAGE) 500 MG tablet Take 1 tablet (500 mg total) by mouth 2 (two) times daily with a meal. 01/25/15  Yes Short, Noah Delaine, MD  Multiple Vitamins-Minerals (CENTRUM ADULTS) TABS Take 1 tablet by mouth daily.   Yes [provider]  nitroGLYCERIN (NITROSTAT) 0.4 MG SL tablet Place 1 tablet (0.4 mg total) under the tongue every 5 (five) minutes as needed for chest pain. 06/03/21  Yes Allred, Jeneen Rinks, MD  sodium hypochlorite (DAKIN'S 1/2 STRENGTH) external solution Apply 1 application topically daily. 07/29/21  Yes [provider]  SPIRIVA RESPIMAT 2.5 MCG/ACT AERS Inhale 2 puffs into the lungs daily. 07/27/21  Yes [provider]  torsemide (DEMADEX) 20 MG tablet Take 20 mg by mouth daily. 07/21/21  Yes [provider]    Social History   Socioeconomic History   Marital status: Married    Spouse name: Not on file   Number of children: Not on file   Years of education: Not on file   Highest education level: Not on file  Occupational History   Not on file  Tobacco Use   Smoking status: Former    Packs/day: 0.30    Years: 40.00    Pack years: 12.00    Types: Cigarettes    Start date: 10/16/1968    Quit date: 10/17/2007    Years since quitting: 13.8   Smokeless tobacco: Current    Types: Snuff  Vaping  Use   Vaping Use: Never used  Substance and Sexual Activity   Alcohol use: Yes    Alcohol/week: 42.0 standard drinks    Types: 42 Cans of beer per week    Comment: beer   Drug use: No   Sexual activity: Not on file  Other Topics Concern   Not on file  Social History Narrative   Not on file   Social Determinants of Health   Financial Resource Strain: Not on file  Food Insecurity: Not on file  Transportation Needs: Not on file  Physical Activity: Not on file  Stress: Not on file  Social Connections: Not on file  Intimate Partner Violence: Not on file     Family History  Problem Relation Age of Onset   Heart disease Father    COPD Father    Heart disease Paternal Grandfather     Review of Systems  Constitutional:  Positive for fever.  HENT: Negative.    Cardiovascular:  Positive for orthopnea. Negative for leg swelling.  Gastrointestinal: Negative.   Musculoskeletal:        Right foot wound  Skin: Negative.   Neurological: Negative.   Psychiatric/Behavioral: Negative.       Physical Examination  Vitals:   08/01/21 1600 08/01/21 1630  BP: (!) 127/58 (!) 124/52  Pulse: (!) 49 (!) 51  Resp: (!) 26 (!) 29  Temp:    SpO2: 100% 100%   There is no height or weight on file to calculate BMI.  Physical Exam Constitutional:      Appearance: He is obese.  HENT:     Head: Normocephalic.     Nose: Nose normal.     Comments: On nasal cannula Cardiovascular:     Pulses:          Popliteal pulses are 0 on the right side and 0 on the left side.     Comments: I cannot feel femoral pulses Pulmonary:     Effort: Pulmonary effort is normal.  Abdominal:     General: Abdomen is flat.  Musculoskeletal:     Cervical back: Normal range of motion.  Skin:    Comments: 2cm skin defect on R lateral foot, probes at least 3cm deep   Neurological:     Mental Status: He is alert.  Psychiatric:        Mood and Affect: Mood normal.     CBC    Component Value Date/Time    WBC 11.3 (H) 08/01/2021 1023   RBC 2.92 (L) 08/01/2021 1023   HGB 8.2 (L) 08/01/2021 1023   HCT 26.7 (L) 08/01/2021 1023   PLT 254 08/01/2021 1023   MCV 91.4 08/01/2021 1023   MCH 28.1 08/01/2021 1023   MCHC 30.7 08/01/2021 1023   RDW 16.0 (H) 08/01/2021 1023  LYMPHSABS 1.3 08/01/2021 1023   MONOABS 1.6 (H) 08/01/2021 1023   EOSABS 0.1 08/01/2021 1023   BASOSABS 0.0 08/01/2021 1023    BMET    Component Value Date/Time   NA 131 (L) 08/01/2021 1023   K 3.9 08/01/2021 1023   CL 88 (L) 08/01/2021 1023   CO2 35 (H) 08/01/2021 1023   GLUCOSE 120 (H) 08/01/2021 1023   BUN 18 08/01/2021 1023   CREATININE 0.99 08/01/2021 1023   CREATININE 0.71 03/20/2011 1014   CALCIUM 8.5 (L) 08/01/2021 1023   GFRNONAA >60 08/01/2021 1023   GFRAA >60 05/21/2019 2040    COAGS: Lab Results  Component Value Date   INR 1.39 06/13/2015     Non-Invasive Vascular Imaging:   ABI Findings:  +---------+------------------+-----+----------+--------+  Right    Rt Pressure (mmHg)IndexWaveform  Comment   +---------+------------------+-----+----------+--------+  Brachial 141                    triphasic           +---------+------------------+-----+----------+--------+  PTA      110               0.75 monophasic          +---------+------------------+-----+----------+--------+  DP       116               0.79 biphasic            +---------+------------------+-----+----------+--------+  Great Toe58                0.40                     +---------+------------------+-----+----------+--------+   +---------+------------------+-----+---------+-------+  Left     Lt Pressure (mmHg)IndexWaveform Comment  +---------+------------------+-----+---------+-------+  Brachial 146                    triphasic         +---------+------------------+-----+---------+-------+  PTA      126               0.86 biphasic           +---------+------------------+-----+---------+-------+  DP       114               0.78 biphasic          +---------+------------------+-----+---------+-------+  Great Toe63                0.43                   +---------+------------------+-----+---------+-------+   +-------+-----------+-----------+------------+------------+  ABI/TBIToday's ABIToday's TBIPrevious ABIPrevious TBI  +-------+-----------+-----------+------------+------------+  Right  0.79       0.4                                  +-------+-----------+-----------+------------+------------+  Left   0.86       0.43                                 +-------+-----------+-----------+------------+------------+      ASSESSMENT/PLAN: This is a 70 y.o. male with deep wound right lateral foot without obvious infection on my exam. ABI decreased bilaterally without discernible femoral pulses. Rec admission to [facility], will CTA abdomen/pelvis with bilateral runoff and can plan invasive evaluation from there. We discussed his high risk  of limb loss and demonstrates good understanding of this.    Katyana Trolinger C. Donzetta Matters, MD [facility] Office: [phone] Pager: [phone]

## 2021-08-01 NOTE — ED Notes (Signed)
Per attending physician, discontinue 2nd lactic acid.

## 2021-08-02 ENCOUNTER — Encounter (HOSPITAL_COMMUNITY): Payer: Self-pay | Admitting: Internal Medicine

## 2021-08-02 ENCOUNTER — Inpatient Hospital Stay (HOSPITAL_COMMUNITY): Payer: PPO

## 2021-08-02 ENCOUNTER — Other Ambulatory Visit: Payer: Self-pay

## 2021-08-02 DIAGNOSIS — I10 Essential (primary) hypertension: Secondary | ICD-10-CM

## 2021-08-02 DIAGNOSIS — E669 Obesity, unspecified: Secondary | ICD-10-CM

## 2021-08-02 LAB — COMPREHENSIVE METABOLIC PANEL
ALT: 14 U/L (ref 0–44)
AST: 20 U/L (ref 15–41)
Albumin: 2.9 g/dL — ABNORMAL LOW (ref 3.5–5.0)
Alkaline Phosphatase: 82 U/L (ref 38–126)
Anion gap: 10 (ref 5–15)
BUN: 15 mg/dL (ref 8–23)
CO2: 32 mmol/L (ref 22–32)
Calcium: 8.5 mg/dL — ABNORMAL LOW (ref 8.9–10.3)
Chloride: 90 mmol/L — ABNORMAL LOW (ref 98–111)
Creatinine, Ser: 0.83 mg/dL (ref 0.61–1.24)
GFR, Estimated: >60 mL/min (ref >60)
Glucose, Bld: 110 mg/dL — ABNORMAL HIGH (ref 70–99)
Potassium: 3.7 mmol/L (ref 3.5–5.1)
Sodium: 132 mmol/L — ABNORMAL LOW (ref 135–145)
Total Bilirubin: 1.4 mg/dL — ABNORMAL HIGH (ref 0.3–1.2)
Total Protein: 7.2 g/dL (ref 6.5–8.1)

## 2021-08-02 LAB — CBC
HCT: 27.2 % — ABNORMAL LOW (ref 39.0–52.0)
Hemoglobin: 8.2 g/dL — ABNORMAL LOW (ref 13.0–17.0)
MCH: 27.5 pg (ref 26.0–34.0)
MCHC: 30.1 g/dL (ref 30.0–36.0)
MCV: 91.3 fL (ref 80.0–100.0)
Platelets: 252 10*3/uL (ref 150–400)
RBC: 2.98 MIL/uL — ABNORMAL LOW (ref 4.22–5.81)
RDW: 15.9 % — ABNORMAL HIGH (ref 11.5–15.5)
WBC: 7.9 10*3/uL (ref 4.0–10.5)
nRBC: 0 % (ref 0.0–0.2)

## 2021-08-02 LAB — GLUCOSE, CAPILLARY: Glucose-Capillary: 129 mg/dL — ABNORMAL HIGH (ref 70–99)

## 2021-08-02 LAB — SARS CORONAVIRUS 2 (TAT 6-24 HRS): SARS Coronavirus 2: NEGATIVE

## 2021-08-02 LAB — HEMOGLOBIN A1C
Hgb A1c MFr Bld: 5.5 % (ref 4.8–5.6)
Mean Plasma Glucose: 111.15 mg/dL

## 2021-08-02 LAB — CBG MONITORING, ED: Glucose-Capillary: 99 mg/dL (ref 70–99)

## 2021-08-02 MED ORDER — ALBUTEROL SULFATE HFA 108 (90 BASE) MCG/ACT IN AERS: 1.0000 | INHALATION_SPRAY | Freq: Four times a day (QID) | RESPIRATORY_TRACT | Status: DC | PRN

## 2021-08-02 MED ORDER — OXYCODONE HCL 5 MG PO TABS
5.0000 mg | ORAL_TABLET | ORAL | Status: DC | PRN
  Administered 2021-08-03 – 2021-08-05 (×5): 5 mg via ORAL
  Filled 2021-08-02 (×5): qty 1

## 2021-08-02 MED ORDER — ONDANSETRON HCL 4 MG PO TABS: 4.0000 mg | ORAL_TABLET | Freq: Four times a day (QID) | ORAL | Status: DC | PRN

## 2021-08-02 MED ORDER — ONDANSETRON HCL 4 MG/2ML IJ SOLN: 4.0000 mg | Freq: Four times a day (QID) | INTRAMUSCULAR | Status: DC | PRN

## 2021-08-02 MED ORDER — PIPERACILLIN-TAZOBACTAM 3.375 G IVPB
3.3750 g | Freq: Three times a day (TID) | INTRAVENOUS | Status: AC
  Administered 2021-08-02 – 2021-08-04 (×7): 3.375 g via INTRAVENOUS
  Filled 2021-08-02 (×9): qty 50

## 2021-08-02 MED ORDER — INSULIN ASPART 100 UNIT/ML IJ SOLN
0.0000 [IU] | Freq: Three times a day (TID) | INTRAMUSCULAR | Status: DC
  Administered 2021-08-03: 2 [IU] via SUBCUTANEOUS
  Administered 2021-08-03: 3 [IU] via SUBCUTANEOUS
  Administered 2021-08-04 (×3): 2 [IU] via SUBCUTANEOUS
  Administered 2021-08-07: 3 [IU] via SUBCUTANEOUS
  Administered 2021-08-07: 2 [IU] via SUBCUTANEOUS
  Administered 2021-08-08 – 2021-08-09 (×2): 3 [IU] via SUBCUTANEOUS
  Filled 2021-08-02: qty 0.15

## 2021-08-02 MED ORDER — VANCOMYCIN HCL IN DEXTROSE 1-5 GM/200ML-% IV SOLN
1000.0000 mg | Freq: Two times a day (BID) | INTRAVENOUS | Status: AC
  Administered 2021-08-02 – 2021-08-04 (×5): 1000 mg via INTRAVENOUS
  Filled 2021-08-02 (×6): qty 200

## 2021-08-02 MED ORDER — TORSEMIDE 20 MG PO TABS
20.0000 mg | ORAL_TABLET | Freq: Every day | ORAL | Status: DC
  Administered 2021-08-02 – 2021-08-09 (×7): 20 mg via ORAL
  Filled 2021-08-02 (×8): qty 1

## 2021-08-02 MED ORDER — INSULIN ASPART 100 UNIT/ML IJ SOLN
0.0000 [IU] | Freq: Every day | INTRAMUSCULAR | Status: DC
  Filled 2021-08-02: qty 0.05

## 2021-08-02 MED ORDER — ASPIRIN EC 81 MG PO TBEC
81.0000 mg | DELAYED_RELEASE_TABLET | Freq: Every day | ORAL | Status: DC
  Administered 2021-08-02 – 2021-08-09 (×7): 81 mg via ORAL
  Filled 2021-08-02 (×7): qty 1

## 2021-08-02 MED ORDER — IOHEXOL 350 MG/ML SOLN
80.0000 mL | Freq: Once | INTRAVENOUS | Status: AC | PRN
  Administered 2021-08-02: 80 mL via INTRAVENOUS

## 2021-08-02 MED ORDER — ACETAMINOPHEN 650 MG RE SUPP: 650.0000 mg | Freq: Four times a day (QID) | RECTAL | Status: DC | PRN

## 2021-08-02 MED ORDER — UMECLIDINIUM BROMIDE 62.5 MCG/INH IN AEPB
1.0000 | INHALATION_SPRAY | Freq: Every day | RESPIRATORY_TRACT | Status: DC
  Administered 2021-08-03 – 2021-08-09 (×6): 1 via RESPIRATORY_TRACT
  Filled 2021-08-02: qty 7

## 2021-08-02 MED ORDER — CARVEDILOL 25 MG PO TABS
25.0000 mg | ORAL_TABLET | Freq: Two times a day (BID) | ORAL | Status: DC
  Administered 2021-08-02 – 2021-08-09 (×15): 25 mg via ORAL
  Filled 2021-08-02 (×2): qty 1
  Filled 2021-08-02: qty 2
  Filled 2021-08-02 (×12): qty 1

## 2021-08-02 MED ORDER — ALBUTEROL SULFATE (2.5 MG/3ML) 0.083% IN NEBU: 2.5000 mg | INHALATION_SOLUTION | Freq: Four times a day (QID) | RESPIRATORY_TRACT | Status: DC | PRN

## 2021-08-02 MED ORDER — ENOXAPARIN SODIUM 40 MG/0.4ML IJ SOSY
40.0000 mg | PREFILLED_SYRINGE | INTRAMUSCULAR | Status: DC
  Administered 2021-08-02 – 2021-08-08 (×7): 40 mg via SUBCUTANEOUS
  Filled 2021-08-02 (×8): qty 0.4

## 2021-08-02 MED ORDER — TIOTROPIUM BROMIDE MONOHYDRATE 2.5 MCG/ACT IN AERS: 2.0000 | INHALATION_SPRAY | Freq: Every day | RESPIRATORY_TRACT | Status: DC

## 2021-08-02 MED ORDER — ATORVASTATIN CALCIUM 80 MG PO TABS
80.0000 mg | ORAL_TABLET | Freq: Every day | ORAL | Status: DC
  Administered 2021-08-02 – 2021-08-09 (×7): 80 mg via ORAL
  Filled 2021-08-02 (×4): qty 1
  Filled 2021-08-02: qty 2
  Filled 2021-08-02: qty 1

## 2021-08-02 MED ORDER — ACETAMINOPHEN 325 MG PO TABS: 650.0000 mg | ORAL_TABLET | Freq: Four times a day (QID) | ORAL | Status: DC | PRN

## 2021-08-02 NOTE — ED Notes (Signed)
Attempted to call report to [facility] and they said no nurse assign to the patient at this time. I ask for the charge nurse and the secretary said she is not answering her phone.

## 2021-08-02 NOTE — Progress Notes (Addendum)
Primary Care Physician: Nathan Miller R[place]STO NE Other Clinician: Referring Physician: Treating Physician/Extender: Nathan Miller in Treatment: 0 Education Assessment Education Provided To: Patient Education Topics Provided Infection: Methods: Explain/Verbal Responses: State content correctly Wound/Skin Impairment: Methods: Explain/Verbal Responses: State content correctly Electronic Signature(s) Signed: 08/02/2021 5:33:33 PM By: Nathan Hurst RN, BSN Entered By: Nathan Miller on 08/01/2021 08:53:22 -------------------------------------------------------------------------------- Wound Assessment Details Patient Name: Date of Service: Nathan Moll, PA UL D.  08/01/2021 8:15 A M Medical Record Number: [id number] Patient Account Number: [id number] Date of Birth/Sex: Treating RN: 08-05-1951 (70 y.o. Male) Nathan Miller Primary Care Nathan Miller: Nathan Miller R[place] Other Clinician: Referring Nathan Miller: Treating Nathan Miller/Extender: Nathan Miller in Treatment: 0 Wound Status Wound Number: 4 Primary Diabetic Wound/Ulcer of the Lower Extremity Etiology: Wound Location: Right, Plantar Foot Wound Open Wounding Event: Gradually Appeared Status: Date Acquired: 05/30/2021 Comorbid Congestive Heart Failure, Coronary Artery Disease, Hypertension, Weeks Of Treatment: 0 History: Type II Diabetes, Osteoarthritis, Osteomyelitis, Neuropathy, Clustered Wound: No Confinement Anxiety Photos Wound Measurements Length: (cm) 1.8 Width: (cm) 1.5 Depth: (cm) 3.1 Area: (cm) 2.121 Volume: (cm) 6.574 % Reduction in Area: 0% % Reduction in Volume: 0% Tunneling: Yes Position (o'clock): 2 Maximum Distance: (cm) 10.6 Undermining: Yes Starting Position (o'clock): 12 Ending Position (o'clock): 12 Maximum Distance: (cm) 4.7 Wound Description Classification: Grade 3 Wound Margin: Well defined, not attached Exudate Amount: Large Exudate Type: Purulent Exudate Color: yellow, brown, green Foul Odor After Cleansing: No Slough/Fibrino Yes Wound Bed Granulation Amount: Large (67-100%) Exposed Structure Granulation Quality: Red, Pink Fascia Exposed: No Necrotic Amount: Small (1-33%) Fat Layer (Subcutaneous Tissue) Exposed: Yes Necrotic Quality: Adherent Slough Tendon Exposed: No Muscle Exposed: No Joint Exposed: No Bone Exposed: No Electronic Signature(s) Signed: 08/02/2021 5:33:33 PM By: Nathan Hurst RN, BSN Entered By: Nathan Miller on 08/01/2021 08:32:27 -------------------------------------------------------------------------------- Vitals Details Patient Name: Date of Service: Nathan Moll, PA UL D. 08/01/2021 8:15 A M Medical Record Number:  [id number] Patient Account Number: [id number] Date of Birth/Sex: Treating RN: Oct 21, 1951 (70 y.o. Male) Nathan Miller Primary Care Nathan Miller: Nathan Miller [place] Other Clinician: Referring Nathan Miller: Treating Nathan Miller/Extender: Nathan Miller in Treatment: 0 Vital Signs Time Taken: 08:15 Temperature (F): 98.7 Height (in): 68 Pulse (bpm): 52 Source: Stated Respiratory Rate (breaths/min): 18 Weight (lbs): 260 Blood Pressure (mmHg): 144/84 Source: Stated Capillary Blood Glucose (mg/dl): 98 Body Mass Index (BMI): 39.5 Reference Range: 80 - 120 mg / dl Notes glucose per pt report Electronic Signature(s) Signed: 08/02/2021 5:33:33 PM By: Nathan Hurst RN, BSN Entered By: Nathan Miller on 08/01/2021 08:20:03  Nathan Miller, Nathan Miller ([id number]) Visit Report for 08/01/2021 Allergy List Details Patient Name: Date of Service: Nathan Miller, [place] D. 08/01/2021 8:15 A M Medical Record Number: [id number] Patient Account Number: [id number] Date of Birth/Sex: Treating RN: [birth year] (70 y.o. Male) Nathan Miller Primary Care Nathan Miller: Nathan Miller [place] Other Clinician: Referring Nathan Miller: Treating Nathan Miller/Extender: Nathan Miller in Treatment: 0 Allergies Active Allergies penicillin Reaction: rash Severity: Moderate Allergy Notes Electronic Signature(s) Signed: 08/02/2021 5:33:33 PM By: Nathan Hurst RN, BSN Entered By: Nathan Miller on 08/01/2021 08:23:21 -------------------------------------------------------------------------------- [place] Information Details Patient Name: Date of Service: Nathan Miller, [place] UL D. 08/01/2021 8:15 A M Medical Record Number: [id number] Patient Account Number: [id number] Date of Birth/Sex: Treating RN: [birth year] (70 y.o. Male) Nathan Miller Primary Care Nathan Miller: Nathan Miller [place] Other Clinician: Referring [name]: Treating Nathan Miller/Extender: Nathan Miller in Treatment: 0 Visit Information Patient Arrived: Wheel Chair Arrival Time: 08:14 Accompanied By: spouse Transfer Assistance: Manual Patient Identification Verified: Yes Secondary Verification Process Completed: Yes Patient Requires Transmission-Based Precautions: No Patient Has Alerts: No History Since Last Visit Added or deleted any medications: No Any new allergies or adverse reactions: No Had a fall or experienced change in activities of daily living that may affect risk of falls: No Signs or symptoms of abuse/neglect since last visito No Hospitalized since last visit: No Implantable device outside of the clinic excluding cellular tissue based products placed in the center since last visit: No Pain Present Now: No Electronic Signature(s) Signed: 08/02/2021 5:33:33 PM By:  Nathan Hurst RN, BSN Entered By: Nathan Miller on 08/01/2021 08:14:53 -------------------------------------------------------------------------------- Clinic Level of Care Assessment Details Patient Name: Date of Service: Nathan Miller, [place] UL D. 08/01/2021 8:15 A M Medical Record Number: [id number] Patient Account Number: [id number] Date of Birth/Sex: Treating RN: [birth year] (70 y.o. Male) Nathan Miller Primary Care [name]: Nathan Miller [place] Other Clinician: Referring [name]: Treating [name]/Extender: Nathan Miller in Treatment: 0 Clinic Level of Care Assessment Items TOOL 4 Quantity Score X- 1 0 Use when only an EandM is performed on FOLLOW-UP visit ASSESSMENTS - Nursing Assessment / Reassessment X- 1 10 Reassessment of Co-morbidities (includes updates in patient status) X- 1 5 Reassessment of Adherence to Treatment Plan ASSESSMENTS - Wound and Skin A ssessment / Reassessment X - Simple Wound Assessment / Reassessment - one wound 1 5 []  - 0 Complex Wound Assessment / Reassessment - multiple wounds []  - 0 Dermatologic / Skin Assessment (not related to wound area) ASSESSMENTS - Focused Assessment []  - 0 Circumferential Edema Measurements - multi extremities []  - 0 Nutritional Assessment / Counseling / Intervention X- 1 5 Lower Extremity Assessment (monofilament, tuning fork, pulses) []  - 0 Peripheral Arterial Disease Assessment (using hand held doppler) ASSESSMENTS - Ostomy and/or Continence Assessment and Care []  - 0 Incontinence Assessment and Management []  - 0 Ostomy Care Assessment and Management (repouching, etc.) PROCESS - Coordination of Care X - Simple Patient / Family Education for ongoing care 1 15 []  - 0 Complex (extensive) Patient / Family Education for ongoing care X- 1 10 Staff obtains Programmer, systems, Records, T Results / Process Orders est X- 1 10 Staff telephones HHA, Nursing Homes / Clarify orders / etc []  - 0 Routine Transfer to  another Facility (non-emergent condition) []  - 0 Routine Hospital Admission (non-emergent condition) X- 1 15 New Admissions / Biomedical engineer / Ordering NPWT Apligraf, etc. , []  - 0 Emergency Hospital Admission (emergent condition) X- 1 10 Simple Discharge Coordination []  - 0 Complex (extensive) Discharge Coordination  Nathan Miller, Nathan Miller ([id number]) Visit Report for 08/01/2021 Allergy List Details Patient Name: Date of Service: Nathan Miller, [place] D. 08/01/2021 8:15 A M Medical Record Number: [id number] Patient Account Number: [id number] Date of Birth/Sex: Treating RN: [birth year] (70 y.o. Male) Nathan Miller Primary Care Nathan Miller: Nathan Miller [place] Other Clinician: Referring Nathan Miller: Treating Nathan Miller/Extender: Nathan Miller in Treatment: 0 Allergies Active Allergies penicillin Reaction: rash Severity: Moderate Allergy Notes Electronic Signature(s) Signed: 08/02/2021 5:33:33 PM By: Nathan Hurst RN, BSN Entered By: Nathan Miller on 08/01/2021 08:23:21 -------------------------------------------------------------------------------- [place] Information Details Patient Name: Date of Service: Nathan Miller, [place] UL D. 08/01/2021 8:15 A M Medical Record Number: [id number] Patient Account Number: [id number] Date of Birth/Sex: Treating RN: [birth year] (70 y.o. Male) Nathan Miller Primary Care Nathan Miller: Nathan Miller [place] Other Clinician: Referring [name]: Treating Nathan Miller/Extender: Nathan Miller in Treatment: 0 Visit Information Patient Arrived: Wheel Chair Arrival Time: 08:14 Accompanied By: spouse Transfer Assistance: Manual Patient Identification Verified: Yes Secondary Verification Process Completed: Yes Patient Requires Transmission-Based Precautions: No Patient Has Alerts: No History Since Last Visit Added or deleted any medications: No Any new allergies or adverse reactions: No Had a fall or experienced change in activities of daily living that may affect risk of falls: No Signs or symptoms of abuse/neglect since last visito No Hospitalized since last visit: No Implantable device outside of the clinic excluding cellular tissue based products placed in the center since last visit: No Pain Present Now: No Electronic Signature(s) Signed: 08/02/2021 5:33:33 PM By:  Nathan Hurst RN, BSN Entered By: Nathan Miller on 08/01/2021 08:14:53 -------------------------------------------------------------------------------- Clinic Level of Care Assessment Details Patient Name: Date of Service: Nathan Miller, [place] UL D. 08/01/2021 8:15 A M Medical Record Number: [id number] Patient Account Number: [id number] Date of Birth/Sex: Treating RN: [birth year] (70 y.o. Male) Nathan Miller Primary Care [name]: Nathan Miller [place] Other Clinician: Referring [name]: Treating [name]/Extender: Nathan Miller in Treatment: 0 Clinic Level of Care Assessment Items TOOL 4 Quantity Score X- 1 0 Use when only an EandM is performed on FOLLOW-UP visit ASSESSMENTS - Nursing Assessment / Reassessment X- 1 10 Reassessment of Co-morbidities (includes updates in patient status) X- 1 5 Reassessment of Adherence to Treatment Plan ASSESSMENTS - Wound and Skin A ssessment / Reassessment X - Simple Wound Assessment / Reassessment - one wound 1 5 []  - 0 Complex Wound Assessment / Reassessment - multiple wounds []  - 0 Dermatologic / Skin Assessment (not related to wound area) ASSESSMENTS - Focused Assessment []  - 0 Circumferential Edema Measurements - multi extremities []  - 0 Nutritional Assessment / Counseling / Intervention X- 1 5 Lower Extremity Assessment (monofilament, tuning fork, pulses) []  - 0 Peripheral Arterial Disease Assessment (using hand held doppler) ASSESSMENTS - Ostomy and/or Continence Assessment and Care []  - 0 Incontinence Assessment and Management []  - 0 Ostomy Care Assessment and Management (repouching, etc.) PROCESS - Coordination of Care X - Simple Patient / Family Education for ongoing care 1 15 []  - 0 Complex (extensive) Patient / Family Education for ongoing care X- 1 10 Staff obtains Programmer, systems, Records, T Results / Process Orders est X- 1 10 Staff telephones HHA, Nursing Homes / Clarify orders / etc []  - 0 Routine Transfer to  another Facility (non-emergent condition) []  - 0 Routine Hospital Admission (non-emergent condition) X- 1 15 New Admissions / Biomedical engineer / Ordering NPWT Apligraf, etc. , []  - 0 Emergency Hospital Admission (emergent condition) X- 1 10 Simple Discharge Coordination []  - 0 Complex (extensive) Discharge Coordination  Beyza Bellino/Extender: Kalman Shan Weeks in Treatment: 0 Vital Signs Height(in): 62 Capillary Blood Glucose(mg/dl): 98 Weight(lbs): 260 Pulse(bpm): 33 Body Mass Index(BMI): 40 Blood Pressure(mmHg): 144/84 Temperature(F): 98.7 Respiratory Rate(breaths/min): 18 Photos: [N/A:N/A] Right, Plantar Foot N/A N/A Wound Location: Gradually Appeared N/A N/A Wounding Event: Diabetic Wound/Ulcer of the Lower N/A N/A Primary Etiology: Extremity Congestive Heart Failure, Coronary N/A N/A Comorbid History: Artery Disease, Hypertension, Type II Diabetes, Osteoarthritis, Osteomyelitis, Neuropathy, Confinement Anxiety 05/30/2021 N/A N/A Date Acquired: 0 N/A N/A Weeks of Treatment: Open N/A N/A Wound Status: Yes N/A N/A Pending A mputation on Presentation: 1.8x1.5x3.1 N/A N/A Measurements L x W x D (cm) 2.121 N/A N/A A (cm) : rea 6.574 N/A N/A Volume (cm) : 0.00% N/A N/A % Reduction in A rea: 0.00% N/A N/A % Reduction in Volume: 2 Position 1 (o'clock): 10.6 Maximum Distance 1 (cm): 12 Starting Position 1 (o'clock): 12 Ending Position 1 (o'clock): 4.7 Maximum Distance 1 (cm): Yes N/A N/A Tunneling: Yes N/A N/A Undermining: Grade 3 N/A N/A Classification: Large N/A N/A Exudate A mount: Purulent N/A N/A Exudate Type: yellow, brown, green N/A  N/A Exudate Color: Well defined, not attached N/A N/A Wound Margin: Large (67-100%) N/A N/A Granulation A mount: Red, Pink N/A N/A Granulation Quality: Small (1-33%) N/A N/A Necrotic A mount: Fat Layer (Subcutaneous Tissue): Yes N/A N/A Exposed Structures: Fascia: No Tendon: No Muscle: No Joint: No Bone: No Treatment Notes Wound #4 (Foot) Wound Laterality: Plantar, Right Cleanser Wound Cleanser Discharge Instruction: Cleanse the wound with wound cleanser prior to applying a clean dressing using gauze sponges, not tissue or cotton balls. Peri-Wound Care Topical Primary Dressing Saline moistened gauze Discharge Instruction: Lightly pack, ok to use Dakin's at home. Secondary Dressing Woven Gauze Sponge, Non-Sterile 4x4 in Discharge Instruction: Apply over primary dressing as directed. Secured With [employer], 4.5x3.1 (in/yd) Discharge Instruction: Secure with Kerlix as directed. Paper Tape, 2x10 (in/yd) Discharge Instruction: Secure dressing with tape as directed. Compression Wrap Compression Stockings Add-Ons Electronic Signature(s) Signed: 08/04/2021 1:23:11 PM By: Kalman Shan DO Signed: 08/04/2021 5:58:36 PM By: Lorrin Jackson Entered By: Kalman Shan on 08/04/2021 10:05:47 -------------------------------------------------------------------------------- Multi-Disciplinary Care Plan Details Patient Name: Date of Service: Nathan Moll, PA UL D. 08/01/2021 8:15 A M Medical Record Number: [id number] Patient Account Number: [id number] Date of Birth/Sex: Treating RN: October 18, 1951 (70 y.o. Male) Nathan Miller Primary Care Lenise Jr: Nathan Miller [place] Other Clinician: Referring Pressley Barsky: Treating Jo Cerone/Extender: Nathan Miller in Treatment: [place] reviewed with physician Active Inactive Electronic Signature(s) Signed: 08/04/2021 6:17:04 PM By: Baruch Gouty RN, BSN Signed: 08/04/2021 6:21:03 PM By: Nathan Hurst RN,  BSN Previous Signature: 08/02/2021 5:33:33 PM Version By: Nathan Hurst RN, BSN Entered By: Baruch Gouty on 08/04/2021 07:51:22 -------------------------------------------------------------------------------- Pain Assessment Details Patient Name: Date of Service: Nathan Moll, PA UL D. 08/01/2021 8:15 A M Medical Record Number: [id number] Patient Account Number: [id number] Date of Birth/Sex: Treating RN: 10-11-1951 (70 y.o. Male) Nathan Miller Primary Care Aleza Pew: Nathan Miller [place] Other Clinician: Referring Rosaly Labarbera: Treating Jasani Dolney/Extender: Nathan Miller in Treatment: 0 Active Problems Location of Pain Severity and Description of Pain Patient Has Paino No Site Locations Pain Management and Medication Current Pain Management: Electronic Signature(s) Signed: 08/02/2021 5:33:33 PM By: Nathan Hurst RN, BSN Entered By: Nathan Miller on 08/01/2021 08:31:58 -------------------------------------------------------------------------------- Patient/Caregiver Education Details Patient Name: Date of Service: Nathan Moll, PA UL D. 10/3/2022andnbsp8:15 [place] Record Number: [id number] Patient Account Number: [id number] Date of Birth/Gender: Treating RN: 07-May-1951 (70 y.o. Male) Nathan Miller

## 2021-08-02 NOTE — Progress Notes (Signed)
    I have reviewed CT scan CT scan and discussed with primary team.  He is planning to transfer to [facility] when transportation available.  We will plan for aortogram tomorrow in the cath lab with Dr. Virl Cagey as long as he transfers to [facility] today otherwise we will delay until later in the week.  He would benefit from Dr. Sharol Given consultation upon arrival to [facility].    Elzie Knisley C. Donzetta Matters, MD

## 2021-08-02 NOTE — Progress Notes (Signed)
Pharmacy Antibiotic Note  Nathan Miller is a 70 y.o. male with chronic foot wound admitted from wound center on 08/01/2021 with subjective fevers, increased red and swollen right food wound.  Xray showed possible gas forming organism. Pharmacy has been consulted for vancomycin, zosyn, and clindamycin dosing.  Plan: Zosyn 3.375g IV q8h (each dose infused over 4 hours) Clindamycin 900mg  IV q8h Vancomycin to 1g IV q12h  Vancomycin levels at steady state, as indicated  Follow up renal function (daily SCr while on Vancomycin/Zosyn combination), cultures, and for de-escalation as appropriate  Height: 5\' 9"  (175.3 cm) Weight: 117.9 kg (260 lb) IBW/kg (Calculated) : 70.7  Temp (24hrs), Avg:98 F (36.7 C), Min:98 F (36.7 C), Max:98 F (36.7 C)  Recent Labs  Lab 08/01/21 1023 08/01/21 1324 08/02/21 1002  WBC 11.3*  --  7.9  CREATININE 0.99  --  0.83  LATICACIDVEN  --  1.7  --      Estimated Creatinine Clearance: 109.5 mL/min (by C-G formula based on SCr of 0.83 mg/dL).    No Known Allergies  Antimicrobials this admission: 10/3 Zosyn x 1, resumed 10/4 >> 10/3 Vancomycin >> 10/3 Cefepime >> 10/4 10/3 Clindamycin >>  Microbiology results: 10/3 BCx: NGTD 10/3 COVID: negative   Thank you for allowing pharmacy to be a part of this patient's care.  Lindell Spar, PharmD, BCPS Pharmacy: [phone] 08/02/2021 1:54 PM

## 2021-08-02 NOTE — ED Notes (Signed)
ED TO INPATIENT HANDOFF REPORT  ED Nurse Name and Phone #: [phone]  S Name/Age/Gender Nathan Miller 70 y.o. male Room/Bed: WA01/WA01  Code Status   Code Status: Full Code  Home/SNF/Other Home Patient oriented to: self, place, time, and situation Is this baseline? Yes   Triage Complete: Triage complete  Chief Complaint Diabetic foot infection (HCC) [Q46.962, L08.9]  Triage Note Pt arrived via POV, sent from wound clinic, wound on right foot, worsening drainage, concerned for worsening infection.    Allergies No Known Allergies  Level of Care/Admitting Diagnosis ED Disposition     ED Disposition  Admit   Condition  --   Comment  Hospital Area: [facility] [[id number]] Level of Care: Telemetry Medical [104] May admit patient to [facility] or [facility] if equivalent level of care is available:: No Covid Evaluation: covid negative Diagnosis: Diabetic foot inf ection [facility]) [[id number]] Admitting Physician: Teddy Spike [[id number]] Attending Physician: Teddy Spike [[id number]] Estimated length of stay: past midnight tomorrow Certification:: I certify this patient will need inpatient services for at least 2  midnights          B Medical/Surgery History Past Medical History:  Diagnosis Date   CAD (coronary artery disease)    NSTEMI in 2001. Cardiac cath showed: LAD: 40% proximal, LCX: 70% mid, RCA thrombotic occlusion in mid segment. s/p PCI and 2 overlapped BMSs to RCA.    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 by Dr Johney Frame   TEE WITHOUT CARDIOVERSION N/A 04/07/2021   Procedure: TRANSESOPHAGEAL ECHOCARDIOGRAM (TEE);  Surgeon: Parke Poisson, MD;  Location: [facility] ENDOSCOPY;  Service: Cardiology;  Laterality: N/A;   TEMPORARY PACEMAKER INSERTION N/A 01/21/2015   Procedure: TEMPORARY PACEMAKER INSERTION;  Surgeon: Marykay Lex, MD;  Location[facility] CATH LAB;  Service: Cardiovascular;  Laterality: N/A;     A IV Location/Drains/Wounds Patient Lines/Drains/Airways Status     Active Line/Drains/Airways     Name Placement date Placement time Site Days   Peripheral IV 03/30/21 20 G Anterior;Right Forearm 03/30/21  2150  Forearm  125   Peripheral IV 08/01/21 20 G 1" Right Antecubital 08/01/21  1312  Antecubital  1   Peripheral IV 08/01/21 20 G 1" Left Antecubital 08/01/21  1308  Antecubital  1   PICC Single Lumen 04/08/21 Right Basilic 41 cm 0 cm 04/08/21  4403  Basilic  116   Wound / Incision (Open or Dehisced) 04/04/21 Other (Comment) Foot Right chronic full thickness wouns to right plantar foot 04/04/21  --  Foot  120            Intake/Output Last 24 hours  Intake/Output Summary (Last 24 hours) at 08/02/2021 1247 Last data filed at 08/02/2021 0646 Gross per 24 hour  Intake 700 ml  Output --  Net 700 ml    Labs/Imaging Results for orders placed or performed during the hospital encounter of 08/01/21 (from the past 48 hour(s))  Basic metabolic panel     Status: Abnormal  Collection Time: 08/01/21 10:23 AM  Result Value Ref Range   Sodium 131 (L) 135 - 145 mmol/L   Potassium 3.9 3.5 - 5.1 mmol/L   Chloride 88 (L) 98 - 111 mmol/L   CO2 35 (H) 22 - 32 mmol/L   Glucose, Bld 120 (H) 70 - 99 mg/dL    Comment: Glucose reference range applies only to samples taken after fasting for at least 8 hours.   BUN 18 8 - 23 mg/dL   Creatinine, Ser 9.14 0.61 - 1.24 mg/dL   Calcium 8.5 (L) 8.9 - 10.3 mg/dL   GFR, Estimated >78 >29 mL/min    Comment: (NOTE) Calculated using the CKD-EPI Creatinine  Equation (2021)    Anion gap 8 5 - 15    Comment: Performed at [facility], [address], [place], [place] [address]  CBC with Differential     Status: Abnormal   Collection Time: 08/01/21 10:23 AM  Result Value Ref Range   WBC 11.3 (H) 4.0 - 10.5 K/uL   RBC 2.92 (L) 4.22 - 5.81 MIL/uL   Hemoglobin 8.2 (L) 13.0 - 17.0 g/dL   HCT 08.6 (L) 57.8 - 46.9 %   MCV 91.4 80.0 - 100.0 fL   MCH 28.1 26.0 - 34.0 pg   MCHC 30.7 30.0 - 36.0 g/dL   RDW 62.9 (H) 52.8 - 41.3 %   Platelets 254 150 - 400 K/uL   nRBC 0.0 0.0 - 0.2 %   Neutrophils Relative % 73 %   Neutro Abs 8.3 (H) 1.7 - 7.7 K/uL   Lymphocytes Relative 12 %   Lymphs Abs 1.3 0.7 - 4.0 K/uL   Monocytes Relative 14 %   Monocytes Absolute 1.6 (H) 0.1 - 1.0 K/uL   Eosinophils Relative 1 %   Eosinophils Absolute 0.1 0.0 - 0.5 K/uL   Basophils Relative 0 %   Basophils Absolute 0.0 0.0 - 0.1 K/uL   Immature Granulocytes 0 %   Abs Immature Granulocytes 0.05 0.00 - 0.07 K/uL    Comment: Performed at [facility], [address] [place], [place] [address]  Lactic acid, plasma     Status: None   Collection Time: 08/01/21  1:24 PM  Result Value Ref Range   Lactic Acid, Venous 1.7 0.5 - 1.9 mmol/L    Comment: Performed at [facility], [address], [place], [place] [address]  Culture, blood (routine x 2)     Status: None (Preliminary result)   Collection Time: 08/01/21  1:24 PM   Specimen: BLOOD  Result Value Ref Range   Specimen Description      BLOOD LEFT ANTECUBITAL Performed at [facility], [address] [place], [place] [address]    Special Requests      BOTTLES DRAWN AEROBIC AND ANAEROBIC Blood Culture adequate volume Performed at [facility], [address] [place], [place] [address]    Culture      NO GROWTH < 24 HOURS Performed at [facility], [address] [place], [place] [address]    Report Status PENDING    Culture, blood (routine x 2)     Status: None (Preliminary result)   Collection Time: 08/01/21  1:24 PM   Specimen: BLOOD  Result Value Ref Range   Specimen Description      BLOOD RIGHT ANTECUBITAL Performed at [facility], [address] [place], [place] [address]    Special Requests  BOTTLES DRAWN AEROBIC AND ANAEROBIC Blood Culture results may not be optimal due to an excessive volume of blood received in culture bottles Performed at [facility], [address], [place], [place] [address]    Culture      NO GROWTH < 24 HOURS Performed at [facility], [address], [place], [place] [address]    Report Status PENDING   SARS CORONAVIRUS 2 (TAT 6-24 HRS) Nasopharyngeal Nasopharyngeal Swab     Status: None   Collection Time: 08/01/21  2:54 PM   Specimen: Nasopharyngeal Swab  Result Value Ref Range   SARS Coronavirus 2 NEGATIVE NEGATIVE    Comment: (NOTE) SARS-CoV-2 target nucleic acids are NOT DETECTED.  The SARS-CoV-2 RNA is generally detectable in upper and lower respiratory specimens during the acute phase of infection. Negative results do not preclude SARS-CoV-2 infection, do not rule out co-infections with other pathogens, and should not be used as the sole basis for treatment or other patient management decisions. Negative results must be combined with clinical observations, patient history, and epidemiological information. The expected result is Negative.  Fact Sheet for Patients: HairSlick.no  Fact Sheet for Healthcare Providers: quierodirigir.com  This test is not yet approved or cleared by the [place] FDA and  has been authorized for detection and/or diagnosis of SARS-CoV-2 by FDA under an Emergency Use Authorization (EUA). This EUA will remain  in effect (meaning this test can be used) for the duration of the COVID-19 declaration under Se ction  564(b)(1) of the Act, 21 U.S.C. section 360bbb-3(b)(1), unless the authorization is terminated or revoked sooner.  Performed at [facility], [address] [place], [place] [address]   Hemoglobin A1c     Status: None   Collection Time: 08/02/21 10:02 AM  Result Value Ref Range   Hgb A1c MFr Bld 5.5 4.8 - 5.6 %    Comment: (NOTE) Pre diabetes:          5.7%-6.4%  Diabetes:              >6.4%  Glycemic control for   <7.0% adults with diabetes    Mean Plasma Glucose 111.15 mg/dL    Comment: Performed at [facility], [address], [place], [place] [address]  CBC     Status: Abnormal   Collection Time: 08/02/21 10:02 AM  Result Value Ref Range   WBC 7.9 4.0 - 10.5 K/uL   RBC 2.98 (L) 4.22 - 5.81 MIL/uL   Hemoglobin 8.2 (L) 13.0 - 17.0 g/dL   HCT 24.4 (L) 01.0 - 27.2 %   MCV 91.3 80.0 - 100.0 fL   MCH 27.5 26.0 - 34.0 pg   MCHC 30.1 30.0 - 36.0 g/dL   RDW 53.6 (H) 64.4 - 03.4 %   Platelets 252 150 - 400 K/uL   nRBC 0.0 0.0 - 0.2 %    Comment: Performed at [facility], [address], [place], [place] [address]  Comprehensive metabolic panel     Status: Abnormal   Collection Time: 08/02/21 10:02 AM  Result Value Ref Range   Sodium 132 (L) 135 - 145 mmol/L   Potassium 3.7 3.5 - 5.1 mmol/L   Chloride 90 (L) 98 - 111 mmol/L   CO2 32 22 - 32 mmol/L   Glucose, Bld 110 (H) 70 - 99 mg/dL    Comment: Glucose reference range applies only to samples taken after fasting for at least 8 hours.  BUN 15 8 - 23 mg/dL   Creatinine, Ser 6.57 0.61 - 1.24 mg/dL   Calcium 8.5 (L) 8.9 - 10.3 mg/dL   Total Protein 7.2 6.5 - 8.1 g/dL   Albumin 2.9 (L) 3.5 - 5.0 g/dL   AST 20 15 - 41 U/L   ALT 14 0 - 44 U/L   Alkaline Phosphatase 82 38 - 126 U/L   Total Bilirubin 1.4 (H) 0.3 - 1.2 mg/dL   GFR, Estimated >84 >69 mL/min    Comment: (NOTE) Calculated using the CKD-EPI Creatinine Equation (2021)    Anion gap 10 5 - 15    Comment: Performed at [facility], [address] [place], [place] [address]  CBG monitoring, ED     Status: None   Collection Time: 08/02/21 12:38 PM  Result Value Ref Range   Glucose-Capillary 99 70 - 99 mg/dL    Comment: Glucose reference range applies only to samples taken after fasting for at least 8 hours.   CT ANGIO AO+BIFEM W & OR WO CONTRAST  Result Date: 08/02/2021 CLINICAL DATA:  Claudication Nonhealing wound on right foot with increasing drainage over the last few days History of diabetes, hypertension, osteomyelitis EXAM: CT ANGIOGRAPHY OF ABDOMINAL AORTA WITH ILIOFEMORAL RUNOFF TECHNIQUE: Multidetector CT imaging of the abdomen, pelvis and lower extremities was performed using the standard protocol during bolus administration of intravenous contrast. Multiplanar CT image reconstructions and MIPs were obtained to evaluate the vascular anatomy. CONTRAST:  80 mL OMNIPAQUE IOHEXOL 350 MG/ML SOLN COMPARISON:  None. FINDINGS: VASCULAR Aorta: Normal caliber aorta without aneurysm, dissection, vasculitis or significant stenosis. Celiac: Patent without evidence of aneurysm, dissection, vasculitis or significant stenosis. SMA: Patent without evidence of aneurysm, dissection, vasculitis or significant stenosis. Renals: Both renal arteries are patent without evidence of aneurysm, dissection, vasculitis, fibromuscular dysplasia or significant stenosis. IMA: Patent without evidence of aneurysm, dissection, vasculitis or significant stenosis. RIGHT Lower Extremity Inflow: Scattered atheromatous plaque of the common, internal common external iliac arteries without significant stenosis. Outflow: No significant stenosis of the common femoral or profunda femoris arteries. Atherosclerotic calcifications seen throughout the superficial femoral artery with highest degree of stenosis seen in the distal 1/3 measuring up to 60%. No significant stenosis of the popliteal artery. Runoff: Evaluation of the lower leg arteries is  limited due to suboptimal opacification. No significant narrowing of the tibioperoneal trunk is suspected. There is minimal atherosclerotic changes of the lower leg arteries which are likely patent with three-vessel runoff to the right ankle. LEFT Lower Extremity Inflow: Evaluation limited due to suboptimal opacification. Scattered atheromatous plaque seen throughout the common, internal common external iliac arteries without high-grade stenosis. Outflow: Scattered plaque seen throughout the common femoral artery without high-grade stenosis. There is likely severe stenosis at the origin of the profunda femoris artery which is otherwise patent. Multifocal high-grade stenoses seen throughout the mid left superficial femoral artery due to atheromatous plaque. Popliteal artery is diminutive but patent. Runoff: Tibioperoneal trunk is patent. Two vessel runoff to the left ankle through the peroneal and posterior tibial arteries. There is discontinuous opacification of the anterior tibial artery. Veins: No obvious venous abnormality within the limitations of this arterial phase study. Review of the MIP images confirms the above findings. NON-VASCULAR Lower chest: Trace bilateral pleural effusions. Mild bibasilar atelectasis. Hepatobiliary: No focal liver abnormality is seen. No gallstones, gallbladder wall thickening, or biliary dilatation. Pancreas: Unremarkable. No pancreatic ductal dilatation or surrounding inflammatory changes. Spleen: Normal in size without focal abnormality. Adrenals/Urinary Tract:  Adrenal glands are normal. Two simple right renal cysts are seen with the larger measuring 3.3 cm. No signal abnormality of the left kidney, ureters, or bladder. Stomach/Bowel: Extensive diverticulosis of the descending sigmoid colon without evidence of acute diverticulitis. No bowel dilatation to indicate ileus or obstruction. Lymphatic: Mildly enlarged right inguinal and right common iliac chain lymph nodes are likely  inflammatory given ongoing infection in the right foot. Reproductive: Prostate is unremarkable. Other: Large fat containing umbilical hernia. Subcutaneous edema noted within the calves bilaterally. Musculoskeletal: Advanced degenerative changes of the right foot again seen likely due to combination of chronic infection and neuropathic arthropathy. Large wound with subcutaneous emphysema again seen in the hindfoot. The emphysema extends up into the posterior ankle soft tissues. Deformity of the left mid femur consistent with prior healed fracture. Degenerative changes seen throughout the lumbar spine. IMPRESSION: VASCULAR 1. Atherosclerotic changes of the right superficial femoral artery with high degree of stenosis measuring 60%. 2. Multifocal high-grade stenosis of the left superficial femoral artery. 3. Three-vessel runoff to the right ankle. Two vessel runoff to the left ankle. NON-VASCULAR 1. Advanced degenerative changes of the right foot with active ulcer and abscess again seen. 2. Extensive distal colonic diverticulosis without evidence of acute diverticulitis. Electronically Signed   By: Acquanetta Belling M.D.   On: 08/02/2021 09:11   CT FOOT RIGHT W CONTRAST  Result Date: 08/01/2021 CLINICAL DATA:  Right foot infection. EXAM: CT OF THE LOWER RIGHT EXTREMITY WITH CONTRAST TECHNIQUE: Multidetector CT imaging of the lower right extremity was performed according to the standard protocol following intravenous contrast administration. CONTRAST:  80mL OMNIPAQUE IOHEXOL 350 MG/ML SOLN COMPARISON:  Right foot x-rays from same day. CT right foot dated Mar 23, 2017. FINDINGS: Bones/Joint/Cartilage No bony destruction. Unchanged chronic periosteal reaction of the distal tibia and fibula. 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 Similar subtalar and Chopart joint ankylosis. Osteopenia. No joint effusion. Ligaments Ligaments are suboptimally evaluated by CT.  Muscles and Tendons Severely atrophy. Soft tissue Progressive deep soft tissue ulceration along the lateral and plantar aspect of the hindfoot. Prominent phlegmonous change around the ulceration without well-defined drainable fluid collection. A small amount of subcutaneous emphysema tracks superiorly into the superficial posterolateral distal lower leg. Diffuse skin thickening and soft tissue swelling. No soft tissue mass. IMPRESSION: 1. Progressive deep soft tissue ulceration along the lateral and plantar aspect of the hindfoot with surrounding prominent phlegmonous change but without well-defined drainable fluid collection. Small amount of subcutaneous emphysema tracks superiorly into the superficial posterolateral distal lower leg, most likely associated with the patient's wound rather than necrotizing infection. 2. No CT evidence of acute osteomyelitis. 3. 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 Electronically Signed   By: Obie Dredge M.D.   On: 08/01/2021 14:06   DG Foot Complete Right  Result Date: 08/01/2021 CLINICAL DATA:  Purulent drainage from RIGHT foot wound, increased swelling and redness; [facility] ED for RIGHT foot infection, history of chronic osteomyelitis EXAM: RIGHT FOOT COMPLETE - 3+ VIEW COMPARISON:  04/05/2021 FINDINGS: Osseous demineralization. Marked soft tissue swelling. Foci of soft tissue gas are identified at the posterior aspect of the RIGHT ankle extending into plantar aspect of foot laterally. Findings consistent with either penetrating wound or soft tissue infection by gas-forming organism. Extensive degenerative changes deformity throughout the tarsals to MTP joints. Bone destruction at proximal RIGHT fifth meta tarsal and distal lateral cuboid again  seen. Nonunion of old fracture at lateral base of proximal phalanx great toe. No acute fracture or bone destruction definitely visualized. IMPRESSION: Extensive  soft tissue swelling with foci of soft tissue gas at the lateral heel and posterior ankle either representing penetrating wound or infection by a gas-forming organism. Extensive degenerative changes and deformities throughout RIGHT foot similar to previous exam. No definite acute fracture or new bone destruction identified. Findings called to [facility] on 08/01/2021 at 1104 hours. Electronically Signed   By: Ulyses Southward M.D.   On: 08/01/2021 10:53   VAS [place] ABI WITH/WO TBI  Result Date: 08/01/2021  LOWER EXTREMITY DOPPLER STUDY [name]  Date of Exam:   08/01/2021 Medical Rec #: [id number]     Accession #:    [id number] Date of Birth: 1951/04/24    Patient Gender: M Patient Age:   31 years Exam Location:  [facility] Procedure:      VAS [place] ABI WITH/WO TBI Referring Phys: Margie Ege --------------------------------------------------------------------------------  Indications: Ulceration. High Risk Factors: Diabetes.  Comparison Study: No prior studies. Performing Technologist: Olen Cordial RVT  Examination Guidelines: A complete evaluation includes at minimum, Doppler waveform signals and systolic blood pressure reading at the level of bilateral brachial, anterior tibial, and posterior tibial arteries, when vessel segments are accessible. Bilateral testing is considered an integral part of a complete examination. Photoelectric Plethysmograph (PPG) waveforms and toe systolic pressure readings are included as required and additional duplex testing as needed. Limited examinations for reoccurring indications may be performed as noted.  ABI Findings: +---------+------------------+-----+----------+--------+ Right    Rt Pressure (mmHg)IndexWaveform  Comment  +---------+------------------+-----+----------+--------+ Brachial 141                    triphasic          +---------+------------------+-----+----------+--------+ PTA      110               0.75 monophasic          +---------+------------------+-----+----------+--------+ DP       116               0.79 biphasic           +---------+------------------+-----+----------+--------+ Great Toe58                0.40                    +---------+------------------+-----+----------+--------+ +---------+------------------+-----+---------+-------+ Left     Lt Pressure (mmHg)IndexWaveform Comment +---------+------------------+-----+---------+-------+ Brachial 146                    triphasic        +---------+------------------+-----+---------+-------+ PTA      126               0.86 biphasic         +---------+------------------+-----+---------+-------+ DP       114               0.78 biphasic         +---------+------------------+-----+---------+-------+ Great Toe63                0.43                  +---------+------------------+-----+---------+-------+ +-------+-----------+-----------+------------+------------+ ABI/TBIToday's ABIToday's TBIPrevious ABIPrevious TBI +-------+-----------+-----------+------------+------------+ Right  0.79       0.4                                 +-------+-----------+-----------+------------+------------+  Left   0.86       0.43                                +-------+-----------+-----------+------------+------------+  Summary: Right: Resting right ankle-brachial index indicates moderate right lower extremity arterial disease. The right toe-brachial index is abnormal. Left: Resting left ankle-brachial index indicates mild left lower extremity arterial disease. The left toe-brachial index is abnormal.  *See table(s) above for measurements and observations.  Electronically signed by Lemar Livings MD on 08/01/2021 at 4:54:58 PM.    Final     Pending Labs Unresulted Labs (From admission, onward)     Start     Ordered   08/08/21 0500  Creatinine, serum  (enoxaparin (LOVENOX)    CrCl >/= 30 ml/min)  Weekly,   R     Comments: while on enoxaparin  therapy    08/02/21 0926            Vitals/Pain Today's Vitals   08/02/21 1100 08/02/21 1130 08/02/21 1200 08/02/21 1230  BP: 135/86 (!) 150/63 (!) 148/63 (!) 143/120  Pulse: (!) 101  (!) 50 (!) 49  Resp:    18  Temp:    98 F (36.7 C)  TempSrc:    Oral  SpO2: (!) 49%  90% 100%  PainSc:    0-No pain    Isolation Precautions No active isolations  Medications Medications  aspirin EC tablet 81 mg (81 mg Oral Given 08/02/21 1126)  atorvastatin (LIPITOR) tablet 80 mg (80 mg Oral Given 08/02/21 1126)  carvedilol (COREG) tablet 25 mg (25 mg Oral Given 08/02/21 1126)  torsemide (DEMADEX) tablet 20 mg (20 mg Oral Given 08/02/21 1126)  insulin aspart (novoLOG) injection 0-15 Units (has no administration in time range)  insulin aspart (novoLOG) injection 0-5 Units (has no administration in time range)  enoxaparin (LOVENOX) injection 40 mg (has no administration in time range)  acetaminophen (TYLENOL) tablet 650 mg (has no administration in time range)    Or  acetaminophen (TYLENOL) suppository 650 mg (has no administration in time range)  oxyCODONE (Oxy IR/ROXICODONE) immediate release tablet 5 mg (has no administration in time range)  ondansetron (ZOFRAN) tablet 4 mg (has no administration in time range)    Or  ondansetron (ZOFRAN) injection 4 mg (has no administration in time range)  clindamycin (CLEOCIN) IVPB 900 mg (0 mg Intravenous Stopped 08/02/21 0645)  ceFEPIme (MAXIPIME) 2 g in sodium chloride 0.9 % 100 mL IVPB (0 g Intravenous Stopped 08/02/21 0646)  vancomycin (VANCOREADY) IVPB 1750 mg/350 mL (has no administration in time range)  albuterol (PROVENTIL) (2.5 MG/3ML) 0.083% nebulizer solution 2.5 mg (has no administration in time range)  umeclidinium bromide (INCRUSE ELLIPTA) 62.5 MCG/INH 1 puff (1 puff Inhalation Not Given 08/02/21 1000)  piperacillin-tazobactam (ZOSYN) IVPB 3.375 g (0 g Intravenous Stopped 08/01/21 1347)  iohexol (OMNIPAQUE) 350 MG/ML injection 80 mL (80 mLs  Intravenous Contrast Given 08/01/21 1330)  vancomycin (VANCOREADY) IVPB 2000 mg/400 mL (0 mg Intravenous Stopped 08/01/21 2126)  iohexol (OMNIPAQUE) 350 MG/ML injection 80 mL (80 mLs Intravenous Contrast Given 08/02/21 0751)    Mobility walks with device Moderate fall risk   Focused Assessments .   R Recommendations: See Admitting Provider Note  Report given to:   Additional Notes: n/a

## 2021-08-02 NOTE — Progress Notes (Addendum)
04/05/2021 FINDINGS: Osseous demineralization. Marked soft tissue swelling. Foci of soft tissue gas are identified at the posterior aspect of the RIGHT ankle extending into plantar aspect of foot laterally. Findings consistent with either penetrating wound or soft tissue infection by gas-forming organism. Extensive degenerative changes deformity throughout the tarsals to MTP joints. Bone destruction at proximal RIGHT fifth meta tarsal and distal lateral cuboid again seen. Nonunion of old fracture at lateral base of proximal phalanx great toe. No acute fracture or bone destruction definitely visualized. IMPRESSION: Extensive soft tissue swelling with foci of soft tissue gas at the lateral heel and posterior ankle either representing penetrating wound or infection by a gas-forming organism. Extensive degenerative changes and deformities throughout RIGHT  foot similar to previous exam. No definite acute fracture or new bone destruction identified. Findings called to [facility] on 08/01/2021 at 1104 hours. Electronically Signed   By: Lavonia Dana M.D.   On: 08/01/2021 10:53   VAS [place] ABI WITH/WO TBI  Result Date: 08/01/2021  LOWER EXTREMITY DOPPLER STUDY [name]  Date of Exam:   08/01/2021 Medical Rec #: [id number]     Accession #:    [id number] Date of Birth: Nov 18, 1950    Patient Gender: M Patient Age:   70 years Exam Location:  [facility] Procedure:      VAS [place] ABI WITH/WO TBI Referring Phys: Cherylann Ratel --------------------------------------------------------------------------------  Indications: Ulceration. High Risk Factors: Diabetes.  Comparison Study: No prior studies. Performing Technologist: Carlos Levering RVT  Examination Guidelines: A complete evaluation includes at minimum, Doppler waveform signals and systolic blood pressure reading at the level of bilateral brachial, anterior tibial, and posterior tibial arteries, when vessel segments are accessible. Bilateral testing is considered an integral part of a complete examination. Photoelectric Plethysmograph (PPG) waveforms and toe systolic pressure readings are included as required and additional duplex testing as needed. Limited examinations for reoccurring indications may be performed as noted.  ABI Findings: +---------+------------------+-----+----------+--------+ Right    Rt Pressure (mmHg)IndexWaveform  Comment  +---------+------------------+-----+----------+--------+ Brachial 141                    triphasic          +---------+------------------+-----+----------+--------+ PTA      110               0.75 monophasic         +---------+------------------+-----+----------+--------+ DP       116               0.79 biphasic           +---------+------------------+-----+----------+--------+ Great Toe58                0.40                     +---------+------------------+-----+----------+--------+ +---------+------------------+-----+---------+-------+ Left     Lt Pressure (mmHg)IndexWaveform Comment +---------+------------------+-----+---------+-------+ Brachial 146                    triphasic        +---------+------------------+-----+---------+-------+ PTA      126               0.86 biphasic         +---------+------------------+-----+---------+-------+ DP       114               0.78 biphasic         +---------+------------------+-----+---------+-------+ Renato Gails  04/05/2021 FINDINGS: Osseous demineralization. Marked soft tissue swelling. Foci of soft tissue gas are identified at the posterior aspect of the RIGHT ankle extending into plantar aspect of foot laterally. Findings consistent with either penetrating wound or soft tissue infection by gas-forming organism. Extensive degenerative changes deformity throughout the tarsals to MTP joints. Bone destruction at proximal RIGHT fifth meta tarsal and distal lateral cuboid again seen. Nonunion of old fracture at lateral base of proximal phalanx great toe. No acute fracture or bone destruction definitely visualized. IMPRESSION: Extensive soft tissue swelling with foci of soft tissue gas at the lateral heel and posterior ankle either representing penetrating wound or infection by a gas-forming organism. Extensive degenerative changes and deformities throughout RIGHT  foot similar to previous exam. No definite acute fracture or new bone destruction identified. Findings called to [facility] on 08/01/2021 at 1104 hours. Electronically Signed   By: Lavonia Dana M.D.   On: 08/01/2021 10:53   VAS [place] ABI WITH/WO TBI  Result Date: 08/01/2021  LOWER EXTREMITY DOPPLER STUDY [name]  Date of Exam:   08/01/2021 Medical Rec #: [id number]     Accession #:    [id number] Date of Birth: Nov 18, 1950    Patient Gender: M Patient Age:   70 years Exam Location:  [facility] Procedure:      VAS [place] ABI WITH/WO TBI Referring Phys: Cherylann Ratel --------------------------------------------------------------------------------  Indications: Ulceration. High Risk Factors: Diabetes.  Comparison Study: No prior studies. Performing Technologist: Carlos Levering RVT  Examination Guidelines: A complete evaluation includes at minimum, Doppler waveform signals and systolic blood pressure reading at the level of bilateral brachial, anterior tibial, and posterior tibial arteries, when vessel segments are accessible. Bilateral testing is considered an integral part of a complete examination. Photoelectric Plethysmograph (PPG) waveforms and toe systolic pressure readings are included as required and additional duplex testing as needed. Limited examinations for reoccurring indications may be performed as noted.  ABI Findings: +---------+------------------+-----+----------+--------+ Right    Rt Pressure (mmHg)IndexWaveform  Comment  +---------+------------------+-----+----------+--------+ Brachial 141                    triphasic          +---------+------------------+-----+----------+--------+ PTA      110               0.75 monophasic         +---------+------------------+-----+----------+--------+ DP       116               0.79 biphasic           +---------+------------------+-----+----------+--------+ Great Toe58                0.40                     +---------+------------------+-----+----------+--------+ +---------+------------------+-----+---------+-------+ Left     Lt Pressure (mmHg)IndexWaveform Comment +---------+------------------+-----+---------+-------+ Brachial 146                    triphasic        +---------+------------------+-----+---------+-------+ PTA      126               0.86 biphasic         +---------+------------------+-----+---------+-------+ DP       114               0.78 biphasic         +---------+------------------+-----+---------+-------+ Renato Gails  PROGRESS NOTE    Nathan Miller  HER:[id number] DOB: [birth year] DOA: 08/01/2021 PCP: [facility]   Brief Narrative: Nathan Miller is a 70 y.o. male with a history of CAD, diabetes mellitus, type 2, chronic lymphedema, complete heart block s/p PPM, chronic foot wound followed by wound care. Patient presented secondary to purulent drainage from right foot and found to have acute infection with initial concern for possibly necrotizing infection. CT imaging not completely suggestive, but patient covered by Vancomycin, Zosyn, Clindamycin   Assessment & Plan:   Principal Problem:   Diabetic foot infection ([place]) Active Problems:   Essential hypertension   Complete heart block ([facility])   Right foot ulcer ([place])   Diabetes type 2, controlled ([place])   Obesity with body mass index (BMI) of 30.0 to 39.9  Right diabetic foot wound Patient has a history of treated right foot osteomyelitis. This admission, vascular surgery consulted. X-ray concerning for gas-producing organism, however CT foot significant for no drainable fluid collection and small amount of subcutaneous emphysema less concerning for necrotizing infection. CTA abdomen obtained by vascular surgery significant for right superficial femoral stenosis of high degree measuring 60% in addition to multifocal high-grade stenosis of left superficial femoral artery. -Vascular surgery recommendations: plan for aortogram tomorrow (10/5) -NPO after midnight -Continue Vancomycin/Zosyn/Clindamycin  PAD Contributing to above. Plan as mentioned above.  Diabetes mellitus, type 2 Hemoglobin A1C of 5.5%. Patient is on metformin as an outpatient which is held on admission. -Continue SSI -Carb modified diet  Primary hypertension Patient is on is on Coreg as an outpatient -Continue Coreg  CAD No chest pain. -Continue aspirin  Diastolic heart dysfunction Grade III dysfunction with normal EF. -Continue  torsemide  Anemia Prior baseline of about 12. Down to 8.2 this admission. Unsure of etiology. Patient declined FOBT in the ED. -AM iron/TIBC/ferritin -Recommend FOBT  Complete heart block S/p PPM  Hyperlipidemia -Continue Lipitor  Tobacco use Counseled on admission  COPD -Continue Spiriva and Albuterol  Diverticulosis Noted on CT imaging. No symptoms.  Morbid obesity Body mass index is 38.4 kg/m.  DVT prophylaxis: Lovenox Code Status:   Code Status: Prior Family Communication: None at bedside Disposition Plan: Transfer to [facility]. Confirmed he has a bed today (10/3)   Consultants:  Vascular surgery [facility] (curbside)  Procedures:  None  Antimicrobials: Vancomycin Cefepime Zosyn Clindamycin    Subjective: No concerns this morning.  Objective: Vitals:   08/02/21 0200 08/02/21 0230 08/02/21 0500 08/02/21 0630  BP: (!) 146/65 (!) 140/57 136/60 118/62  Pulse: 82 (!) 50 (!) 50 (!) 50  Resp: (!) 22 (!) 26 (!) 27 (!) 23  Temp:      TempSrc:      SpO2: 96% 100% 99% 100%    Intake/Output Summary (Last 24 hours) at 08/02/2021 0731 Last data filed at 08/02/2021 0646 Gross per 24 hour  Intake 700 ml  Output --  Net 700 ml   There were no vitals filed for this visit.  Examination:  General exam: Appears calm and comfortable  Respiratory system: Clear to auscultation. Respiratory effort normal. Cardiovascular system: S1 & S2 heard, RRR. No murmurs, rubs, gallops or clicks. Gastrointestinal system: Abdomen is nondistended, soft and nontender. No organomegaly or masses felt. Normal bowel sounds heard. Central nervous system: Alert and oriented. No focal neurological deficits. Musculoskeletal: BLE edema. No calf tenderness. Right foot wrapped in gauze Skin: No cyanosis. BLE venous stasis changes Psychiatry: Judgement and insight appear normal. Mood &  04/05/2021 FINDINGS: Osseous demineralization. Marked soft tissue swelling. Foci of soft tissue gas are identified at the posterior aspect of the RIGHT ankle extending into plantar aspect of foot laterally. Findings consistent with either penetrating wound or soft tissue infection by gas-forming organism. Extensive degenerative changes deformity throughout the tarsals to MTP joints. Bone destruction at proximal RIGHT fifth meta tarsal and distal lateral cuboid again seen. Nonunion of old fracture at lateral base of proximal phalanx great toe. No acute fracture or bone destruction definitely visualized. IMPRESSION: Extensive soft tissue swelling with foci of soft tissue gas at the lateral heel and posterior ankle either representing penetrating wound or infection by a gas-forming organism. Extensive degenerative changes and deformities throughout RIGHT  foot similar to previous exam. No definite acute fracture or new bone destruction identified. Findings called to [facility] on 08/01/2021 at 1104 hours. Electronically Signed   By: Lavonia Dana M.D.   On: 08/01/2021 10:53   VAS [place] ABI WITH/WO TBI  Result Date: 08/01/2021  LOWER EXTREMITY DOPPLER STUDY [name]  Date of Exam:   08/01/2021 Medical Rec #: [id number]     Accession #:    [id number] Date of Birth: Nov 18, 1950    Patient Gender: M Patient Age:   70 years Exam Location:  [facility] Procedure:      VAS [place] ABI WITH/WO TBI Referring Phys: Cherylann Ratel --------------------------------------------------------------------------------  Indications: Ulceration. High Risk Factors: Diabetes.  Comparison Study: No prior studies. Performing Technologist: Carlos Levering RVT  Examination Guidelines: A complete evaluation includes at minimum, Doppler waveform signals and systolic blood pressure reading at the level of bilateral brachial, anterior tibial, and posterior tibial arteries, when vessel segments are accessible. Bilateral testing is considered an integral part of a complete examination. Photoelectric Plethysmograph (PPG) waveforms and toe systolic pressure readings are included as required and additional duplex testing as needed. Limited examinations for reoccurring indications may be performed as noted.  ABI Findings: +---------+------------------+-----+----------+--------+ Right    Rt Pressure (mmHg)IndexWaveform  Comment  +---------+------------------+-----+----------+--------+ Brachial 141                    triphasic          +---------+------------------+-----+----------+--------+ PTA      110               0.75 monophasic         +---------+------------------+-----+----------+--------+ DP       116               0.79 biphasic           +---------+------------------+-----+----------+--------+ Great Toe58                0.40                     +---------+------------------+-----+----------+--------+ +---------+------------------+-----+---------+-------+ Left     Lt Pressure (mmHg)IndexWaveform Comment +---------+------------------+-----+---------+-------+ Brachial 146                    triphasic        +---------+------------------+-----+---------+-------+ PTA      126               0.86 biphasic         +---------+------------------+-----+---------+-------+ DP       114               0.78 biphasic         +---------+------------------+-----+---------+-------+ Renato Gails  PROGRESS NOTE    Nathan Miller  HER:[id number] DOB: [birth year] DOA: 08/01/2021 PCP: [facility]   Brief Narrative: Nathan Miller is a 70 y.o. male with a history of CAD, diabetes mellitus, type 2, chronic lymphedema, complete heart block s/p PPM, chronic foot wound followed by wound care. Patient presented secondary to purulent drainage from right foot and found to have acute infection with initial concern for possibly necrotizing infection. CT imaging not completely suggestive, but patient covered by Vancomycin, Zosyn, Clindamycin   Assessment & Plan:   Principal Problem:   Diabetic foot infection ([place]) Active Problems:   Essential hypertension   Complete heart block (HCC)   Right foot ulcer ([place])   Diabetes type 2, controlled ([place])   Obesity with body mass index (BMI) of 30.0 to 39.9  Right diabetic foot wound Patient has a history of treated right foot osteomyelitis. This admission, vascular surgery consulted. X-ray concerning for gas-producing organism, however CT foot significant for no drainable fluid collection and small amount of subcutaneous emphysema less concerning for necrotizing infection. CTA abdomen obtained by vascular surgery significant for right superficial femoral stenosis of high degree measuring 60% in addition to multifocal high-grade stenosis of left superficial femoral artery. -Vascular surgery recommendations: plan for aortogram tomorrow (10/5) -NPO after midnight -Continue Vancomycin/Zosyn/Clindamycin  PAD Contributing to above. Plan as mentioned above.  Diabetes mellitus, type 2 Hemoglobin A1C of 5.5%. Patient is on metformin as an outpatient which is held on admission. -Continue SSI -Carb modified diet  Primary hypertension Patient is on is on Coreg as an outpatient -Continue Coreg  CAD No chest pain. -Continue aspirin  Diastolic heart dysfunction Grade III dysfunction with normal EF. -Continue  torsemide  Anemia Prior baseline of about 12. Down to 8.2 this admission. Unsure of etiology. Patient declined FOBT in the ED. -AM iron/TIBC/ferritin -Recommend FOBT  Complete heart block S/p PPM  Hyperlipidemia -Continue Lipitor  Tobacco use Counseled on admission  COPD -Continue Spiriva and Albuterol  Diverticulosis Noted on CT imaging. No symptoms.  Morbid obesity Body mass index is 38.4 kg/m.  DVT prophylaxis: Lovenox Code Status:   Code Status: Prior Family Communication: None at bedside Disposition Plan: Transfer to [facility]. Confirmed he has a bed today (10/3)   Consultants:  Vascular surgery [facility] (curbside)  Procedures:  None  Antimicrobials: Vancomycin Cefepime Zosyn Clindamycin    Subjective: No concerns this morning.  Objective: Vitals:   08/02/21 0200 08/02/21 0230 08/02/21 0500 08/02/21 0630  BP: (!) 146/65 (!) 140/57 136/60 118/62  Pulse: 82 (!) 50 (!) 50 (!) 50  Resp: (!) 22 (!) 26 (!) 27 (!) 23  Temp:      TempSrc:      SpO2: 96% 100% 99% 100%    Intake/Output Summary (Last 24 hours) at 08/02/2021 0731 Last data filed at 08/02/2021 0646 Gross per 24 hour  Intake 700 ml  Output --  Net 700 ml   There were no vitals filed for this visit.  Examination:  General exam: Appears calm and comfortable  Respiratory system: Clear to auscultation. Respiratory effort normal. Cardiovascular system: S1 & S2 heard, RRR. No murmurs, rubs, gallops or clicks. Gastrointestinal system: Abdomen is nondistended, soft and nontender. No organomegaly or masses felt. Normal bowel sounds heard. Central nervous system: Alert and oriented. No focal neurological deficits. Musculoskeletal: BLE edema. No calf tenderness. Right foot wrapped in gauze Skin: No cyanosis. BLE venous stasis changes Psychiatry: Judgement and insight appear normal. Mood &

## 2021-08-02 NOTE — ED Notes (Signed)
[facility] call for transport. They are currently backup.

## 2021-08-02 NOTE — Progress Notes (Signed)
RAMONDO, SPIGELMYER ([id number]) Visit Report for 08/01/2021 Abuse/Suicide Risk Screen Details Patient Name: Date of Service: Graceann Congress, [place] D. 08/01/2021 8:15 A M Medical Record Number: [id number] Patient Account Number: [id number] Date of Birth/Sex: Treating RN: 09-06-1951 (71 y.o. Male) Zandra Abts Primary Care Henri Guedes: Clancy Gourd [place] Other Clinician: Referring Mitzie Marlar: Treating Riyad Keena/Extender: Tilda Franco in Treatment: 0 Abuse/Suicide Risk Screen Items Answer ABUSE RISK SCREEN: Has anyone close to you tried to hurt or harm you recentlyo No Do you feel uncomfortable with anyone in your familyo No Has anyone forced you do things that you didnt want to doo No Electronic Signature(s) Signed: 08/02/2021 5:33:33 PM By: Zandra Abts RN, BSN Entered By: Zandra Abts on 08/01/2021 08:27:00 -------------------------------------------------------------------------------- Activities of Daily Living Details Patient Name: Date of Service: Graceann Congress, [place] D. 08/01/2021 8:15 A M Medical Record Number: [id number] Patient Account Number: [id number] Date of Birth/Sex: Treating RN: 12-30-1950 (70 y.o. Male) Zandra Abts Primary Care Christian Borgerding: Clancy Gourd [place] Other Clinician: Referring Benford Asch: Treating Queen Abbett/Extender: Tilda Franco in Treatment: 0 Activities of Daily Living Items Answer Activities of Daily Living (Please select one for each item) Drive Automobile Need Assistance T Medications ake Completely Able Use T elephone Completely Able Care for Appearance Completely Able Use T oilet Completely Able Bath / Shower Need Assistance Dress Self Completely Able Feed Self Completely Able Walk Need Assistance Get In / Out Bed Need Assistance Housework Need Assistance Prepare Meals Need Assistance Handle Money Need Assistance Shop for Self Need Assistance Electronic Signature(s) Signed: 08/02/2021 5:33:33 PM By: Zandra Abts RN,  BSN Entered By: Zandra Abts on 08/01/2021 08:27:31 -------------------------------------------------------------------------------- Education Screening Details Patient Name: Date of Service: Graceann [place], [place] D. 08/01/2021 8:15 A M Medical Record Number: [id number] Patient Account Number: [id number] Date of Birth/Sex: Treating RN: 1951/07/23 (70 y.o. Male) Zandra Abts Primary Care Peng Thorstenson: Clancy Gourd [place] Other Clinician: Referring Aminta Sakurai: Treating Nyima Vanacker/Extender: Tilda Franco in Treatment: 0 Primary Learner Assessed: Patient Learning Preferences/Education Level/Primary Language Learning Preference: Explanation Highest Education Level: High School Preferred Language: English Cognitive Barrier Language Barrier: No Translator Needed: No Memory Deficit: No Emotional Barrier: No Cultural/Religious Beliefs Affecting Medical Care: No Physical Barrier Impaired Vision: No Impaired Hearing: No Decreased Hand dexterity: No Knowledge/Comprehension Knowledge Level: High Comprehension Level: High Ability to understand written instructions: High Ability to understand verbal instructions: High Motivation Anxiety Level: Calm Cooperation: Cooperative Education Importance: Acknowledges Need Interest in Health Problems: Asks Questions Perception: Coherent Willingness to Engage in Self-Management High Activities: Readiness to Engage in Self-Management High Activities: Electronic Signature(s) Signed: 08/02/2021 5:33:33 PM By: Zandra Abts RN, BSN Entered By: Zandra Abts on 08/01/2021 08:27:52 -------------------------------------------------------------------------------- Fall Risk Assessment Details Patient Name: Date of Service: Graceann [place], [place] D. 08/01/2021 8:15 A M Medical Record Number: [id number] Patient Account Number: [id number] Date of Birth/Sex: Treating RN: 1951-05-06 (70 y.o. Male) Zandra Abts Primary Care Marelyn Rouser: Clancy Gourd  [place] Other Clinician: Referring Raziya Aveni: Treating Kynzlie Hilleary/Extender: Tilda Franco in Treatment: 0 Fall Risk Assessment Items Have you had 2 or more falls in the last 12 monthso 0 Yes Have you had any fall that resulted in injury in the last 12 monthso 0 No FALLS RISK SCREEN History of falling - immediate or within 3 months 25 Yes Secondary diagnosis (Do you have 2 or more medical diagnoseso) 15 Yes Ambulatory aid None/bed rest/wheelchair/nurse 0 No Crutches/cane/walker 15 Yes Furniture 0 No Intravenous therapy Access/Saline/Heparin Lock 0 No Gait/Transferring Normal/ bed rest/ wheelchair 0  Yes Weak (short steps with or without shuffle, stooped but able to lift head while walking, may seek 0 No support from furniture) Impaired (short steps with shuffle, may have difficulty arising from chair, head down, impaired 0 No balance) Mental Status Oriented to own ability 0 Yes Electronic Signature(s) Signed: 08/02/2021 5:33:33 PM By: Zandra Abts RN, BSN Entered By: Zandra Abts on 08/01/2021 08:28:05 -------------------------------------------------------------------------------- Foot Assessment Details Patient Name: Date of Service: Graceann [place], [place] D. 08/01/2021 8:15 A M Medical Record Number: [id number] Patient Account Number: [id number] Date of Birth/Sex: Treating RN: January 27, 1951 (70 y.o. Male) Zandra Abts Primary Care Gaynel Schaafsma: Clancy Gourd [place] Other Clinician: Referring Irie Fiorello: Treating Adabella Stanis/Extender: Tilda Franco in Treatment: 0 Foot Assessment Items Site Locations + = Sensation present, - = Sensation absent, C = Callus, U = Ulcer R = Redness, W = Warmth, M = Maceration, PU = Pre-ulcerative lesion F = Fissure, S = Swelling, D = Dryness Assessment Right: Left: Other Deformity: No No Prior Foot Ulcer: No No Prior Amputation: No No Charcot Joint: No No Ambulatory Status: Ambulatory With Help Assistance Device: Cane Gait:  Steady Electronic Signature(s) Signed: 08/02/2021 5:33:33 PM By: Zandra Abts RN, BSN Entered By: Zandra Abts on 08/01/2021 08:28:34 -------------------------------------------------------------------------------- Nutrition Risk Screening Details Patient Name: Date of Service: Graceann [place], [place] D. 08/01/2021 8:15 A M Medical Record Number: [id number] Patient Account Number: [id number] Date of Birth/Sex: Treating RN: 09-29-1951 (70 y.o. Male) Zandra Abts Primary Care Samarth Ogle: Clancy Gourd [place] Other Clinician: Referring Severino Paolo: Treating Karol Liendo/Extender: Tilda Franco in Treatment: 0 Height (in): 68 Weight (lbs): 260 Body Mass Index (BMI): 39.5 Nutrition Risk Screening Items Score Screening NUTRITION RISK SCREEN: I have an illness or condition that made me change the kind and/or amount of food I eat 2 Yes I eat fewer than two meals per day 0 No I eat few fruits and vegetables, or milk products 0 No I have three or more drinks of beer, liquor or wine almost every day 0 No I have tooth or mouth problems that make it hard for me to eat 0 No I don't always have enough money to buy the food I need 0 No I eat alone most of the time 0 No I take three or more different prescribed or over-the-counter drugs a day 0 No Without wanting to, I have lost or gained 10 pounds in the last six months 0 No I am not always physically able to shop, cook and/or feed myself 2 Yes Nutrition Protocols Good Risk Protocol Moderate Risk Protocol 0 Provide education on nutrition High Risk Proctocol Risk Level: Moderate Risk Score: 4 Electronic Signature(s) Signed: 08/02/2021 5:33:33 PM By: Zandra Abts RN, BSN Entered By: Zandra Abts on 08/01/2021 08:28:19

## 2021-08-02 NOTE — Progress Notes (Signed)
Left message via voice mail to On Call MD Vascular Team; notified that pt arrived here in [facility] 5N-15.

## 2021-08-03 ENCOUNTER — Encounter (HOSPITAL_COMMUNITY): Payer: Self-pay | Admitting: Internal Medicine

## 2021-08-03 ENCOUNTER — Inpatient Hospital Stay (HOSPITAL_COMMUNITY): Payer: PPO | Admitting: Anesthesiology

## 2021-08-03 ENCOUNTER — Encounter (HOSPITAL_COMMUNITY)
Admission: EM | Disposition: A | Discharge status: Skilled Nursing Facility | Payer: Self-pay | Source: Home / Self Care | Attending: Internal Medicine

## 2021-08-03 ENCOUNTER — Ambulatory Visit (HOSPITAL_COMMUNITY): Admission: RE | Admit: 2021-08-03 | Payer: PPO | Source: Ambulatory Visit | Admitting: Vascular Surgery

## 2021-08-03 DIAGNOSIS — M86271 Subacute osteomyelitis, right ankle and foot: Secondary | ICD-10-CM

## 2021-08-03 DIAGNOSIS — L97514 Non-pressure chronic ulcer of other part of right foot with necrosis of bone: Secondary | ICD-10-CM

## 2021-08-03 DIAGNOSIS — L089 Local infection of the skin and subcutaneous tissue, unspecified: Secondary | ICD-10-CM

## 2021-08-03 DIAGNOSIS — E11628 Type 2 diabetes mellitus with other skin complications: Principal | ICD-10-CM

## 2021-08-03 DIAGNOSIS — I442 Atrioventricular block, complete: Secondary | ICD-10-CM

## 2021-08-03 HISTORY — PX: AMPUTATION: SHX166

## 2021-08-03 LAB — IRON AND TIBC
Iron: 23 ug/dL — ABNORMAL LOW (ref 45–182)
Saturation Ratios: 9 % — ABNORMAL LOW (ref 17.9–39.5)
TIBC: 258 ug/dL (ref 250–450)
UIBC: 235 ug/dL

## 2021-08-03 LAB — CBC
HCT: 24.5 % — ABNORMAL LOW (ref 39.0–52.0)
Hemoglobin: 7.9 g/dL — ABNORMAL LOW (ref 13.0–17.0)
MCH: 28.7 pg (ref 26.0–34.0)
MCHC: 32.2 g/dL (ref 30.0–36.0)
MCV: 89.1 fL (ref 80.0–100.0)
Platelets: 234 10*3/uL (ref 150–400)
RBC: 2.75 MIL/uL — ABNORMAL LOW (ref 4.22–5.81)
RDW: 15.7 % — ABNORMAL HIGH (ref 11.5–15.5)
WBC: 6.8 10*3/uL (ref 4.0–10.5)
nRBC: 0 % (ref 0.0–0.2)

## 2021-08-03 LAB — GLUCOSE, CAPILLARY
Glucose-Capillary: 149 mg/dL — ABNORMAL HIGH (ref 70–99)
Glucose-Capillary: 102 mg/dL — ABNORMAL HIGH (ref 70–99)
Glucose-Capillary: 122 mg/dL — ABNORMAL HIGH (ref 70–99)
Glucose-Capillary: 171 mg/dL — ABNORMAL HIGH (ref 70–99)
Glucose-Capillary: 288 mg/dL — ABNORMAL HIGH (ref 70–99)

## 2021-08-03 LAB — BASIC METABOLIC PANEL
Anion gap: 10 (ref 5–15)
BUN: 12 mg/dL (ref 8–23)
CO2: 33 mmol/L — ABNORMAL HIGH (ref 22–32)
Calcium: 8.4 mg/dL — ABNORMAL LOW (ref 8.9–10.3)
Chloride: 91 mmol/L — ABNORMAL LOW (ref 98–111)
Creatinine, Ser: 1.07 mg/dL (ref 0.61–1.24)
GFR, Estimated: >60 mL/min (ref >60)
Glucose, Bld: 109 mg/dL — ABNORMAL HIGH (ref 70–99)
Potassium: 3.4 mmol/L — ABNORMAL LOW (ref 3.5–5.1)
Sodium: 134 mmol/L — ABNORMAL LOW (ref 135–145)

## 2021-08-03 LAB — FERRITIN: Ferritin: 61 ng/mL (ref 24–336)

## 2021-08-03 LAB — ABO/RH: ABO/RH(D): O POS

## 2021-08-03 LAB — MRSA NEXT GEN BY PCR, NASAL: MRSA by PCR Next Gen: NOT DETECTED

## 2021-08-03 SURGERY — AMPUTATION BELOW KNEE
Anesthesia: Regional | Site: Knee | Laterality: Right

## 2021-08-03 SURGERY — ABDOMINAL AORTOGRAM W/LOWER EXTREMITY
Anesthesia: LOCAL

## 2021-08-03 MED ORDER — HYDRALAZINE HCL 20 MG/ML IJ SOLN
5.0000 mg | INTRAMUSCULAR | Status: DC | PRN
Start: 2021-08-03 — End: 2021-08-09

## 2021-08-03 MED ORDER — MIDAZOLAM HCL 2 MG/2ML IJ SOLN
1.0000 mg | Freq: Once | INTRAMUSCULAR | Status: AC
  Administered 2021-08-03: 1 mg via INTRAVENOUS
  Filled 2021-08-03: qty 1

## 2021-08-03 MED ORDER — ZINC SULFATE 220 (50 ZN) MG PO CAPS
220.0000 mg | ORAL_CAPSULE | Freq: Every day | ORAL | Status: DC
  Administered 2021-08-03 – 2021-08-09 (×7): 220 mg via ORAL
  Filled 2021-08-03 (×7): qty 1

## 2021-08-03 MED ORDER — HYDROMORPHONE HCL 1 MG/ML IJ SOLN: 0.5000 mg | INTRAMUSCULAR | Status: DC | PRN

## 2021-08-03 MED ORDER — PANTOPRAZOLE SODIUM 40 MG PO TBEC
40.0000 mg | DELAYED_RELEASE_TABLET | Freq: Every day | ORAL | Status: DC
  Administered 2021-08-03 – 2021-08-06 (×4): 40 mg via ORAL
  Filled 2021-08-03 (×4): qty 1

## 2021-08-03 MED ORDER — SODIUM CHLORIDE 0.9 % IV SOLN: INTRAVENOUS | Status: DC

## 2021-08-03 MED ORDER — BUPIVACAINE LIPOSOME 1.3 % IJ SUSP
INTRAMUSCULAR | Status: DC | PRN
  Administered 2021-08-03: 10 mL via PERINEURAL

## 2021-08-03 MED ORDER — OXYCODONE HCL 5 MG PO TABS: 5.0000 mg | ORAL_TABLET | ORAL | Status: DC | PRN

## 2021-08-03 MED ORDER — PROPOFOL 10 MG/ML IV BOLUS
INTRAVENOUS | Status: DC | PRN
  Administered 2021-08-03: 150 mg via INTRAVENOUS

## 2021-08-03 MED ORDER — LACTATED RINGERS IV SOLN: INTRAVENOUS | Status: DC

## 2021-08-03 MED ORDER — FENTANYL CITRATE (PF) 100 MCG/2ML IJ SOLN: 25.0000 ug | INTRAMUSCULAR | Status: DC | PRN

## 2021-08-03 MED ORDER — CHLORHEXIDINE GLUCONATE 4 % EX LIQD: 60.0000 mL | Freq: Once | CUTANEOUS | Status: DC

## 2021-08-03 MED ORDER — METOPROLOL TARTRATE 5 MG/5ML IV SOLN: 2.0000 mg | INTRAVENOUS | Status: DC | PRN

## 2021-08-03 MED ORDER — LIDOCAINE 2% (20 MG/ML) 5 ML SYRINGE
INTRAMUSCULAR | Status: DC | PRN
  Administered 2021-08-03: 20 mg via INTRAVENOUS

## 2021-08-03 MED ORDER — KETAMINE HCL 50 MG/5ML IJ SOSY
PREFILLED_SYRINGE | INTRAMUSCULAR | Status: AC
  Filled 2021-08-03: qty 5

## 2021-08-03 MED ORDER — TRANEXAMIC ACID-NACL 1000-0.7 MG/100ML-% IV SOLN: 1000.0000 mg | INTRAVENOUS | Status: AC

## 2021-08-03 MED ORDER — LABETALOL HCL 5 MG/ML IV SOLN: 10.0000 mg | INTRAVENOUS | Status: DC | PRN

## 2021-08-03 MED ORDER — FENTANYL CITRATE (PF) 250 MCG/5ML IJ SOLN
INTRAMUSCULAR | Status: AC
  Filled 2021-08-03: qty 5

## 2021-08-03 MED ORDER — MAGNESIUM CITRATE PO SOLN
1.0000 | Freq: Once | ORAL | Status: DC | PRN
  Filled 2021-08-03: qty 296

## 2021-08-03 MED ORDER — BUPIVACAINE HCL (PF) 0.5 % IJ SOLN
INTRAMUSCULAR | Status: DC | PRN
  Administered 2021-08-03: 20 mL via PERINEURAL

## 2021-08-03 MED ORDER — CEFAZOLIN SODIUM-DEXTROSE 2-4 GM/100ML-% IV SOLN
2.0000 g | INTRAVENOUS | Status: AC
  Filled 2021-08-03: qty 100

## 2021-08-03 MED ORDER — OXYCODONE HCL 5 MG PO TABS: 10.0000 mg | ORAL_TABLET | ORAL | Status: DC | PRN

## 2021-08-03 MED ORDER — FENTANYL CITRATE (PF) 100 MCG/2ML IJ SOLN
INTRAMUSCULAR | Status: AC
  Administered 2021-08-03: 50 ug
  Filled 2021-08-03: qty 2

## 2021-08-03 MED ORDER — PHENYLEPHRINE HCL-NACL 20-0.9 MG/250ML-% IV SOLN
INTRAVENOUS | Status: DC | PRN
Start: 2021-08-03 — End: 2021-08-03
  Administered 2021-08-03: 50 ug/min via INTRAVENOUS

## 2021-08-03 MED ORDER — 0.9 % SODIUM CHLORIDE (POUR BTL) OPTIME
TOPICAL | Status: DC | PRN
  Administered 2021-08-03: 1000 mL

## 2021-08-03 MED ORDER — EPHEDRINE SULFATE-NACL 50-0.9 MG/10ML-% IV SOSY
PREFILLED_SYRINGE | INTRAVENOUS | Status: DC | PRN
  Administered 2021-08-03: 10 mg via INTRAVENOUS
  Administered 2021-08-03: 5 mg via INTRAVENOUS
  Administered 2021-08-03: 10 mg via INTRAVENOUS

## 2021-08-03 MED ORDER — MAGNESIUM SULFATE 2 GM/50ML IV SOLN
2.0000 g | Freq: Every day | INTRAVENOUS | Status: DC | PRN
  Filled 2021-08-03: qty 50

## 2021-08-03 MED ORDER — FENTANYL CITRATE PF 50 MCG/ML IJ SOSY
50.0000 ug | PREFILLED_SYRINGE | Freq: Once | INTRAMUSCULAR | Status: DC
  Filled 2021-08-03: qty 1

## 2021-08-03 MED ORDER — POTASSIUM CHLORIDE CRYS ER 20 MEQ PO TBCR: 40.0000 meq | EXTENDED_RELEASE_TABLET | Freq: Once | ORAL | Status: DC

## 2021-08-03 MED ORDER — ACETAMINOPHEN 500 MG PO TABS
ORAL_TABLET | ORAL | Status: AC
  Filled 2021-08-03: qty 2

## 2021-08-03 MED ORDER — KETAMINE HCL 10 MG/ML IJ SOLN
INTRAMUSCULAR | Status: DC | PRN
  Administered 2021-08-03: 50 mg via INTRAVENOUS

## 2021-08-03 MED ORDER — ALUM & MAG HYDROXIDE-SIMETH 200-200-20 MG/5ML PO SUSP: 15.0000 mL | ORAL | Status: DC | PRN

## 2021-08-03 MED ORDER — BISACODYL 5 MG PO TBEC
5.0000 mg | DELAYED_RELEASE_TABLET | Freq: Every day | ORAL | Status: DC | PRN
  Administered 2021-08-04 – 2021-08-05 (×2): 5 mg via ORAL
  Filled 2021-08-03 (×2): qty 1

## 2021-08-03 MED ORDER — DOCUSATE SODIUM 100 MG PO CAPS
100.0000 mg | ORAL_CAPSULE | Freq: Every day | ORAL | Status: DC
  Administered 2021-08-04 – 2021-08-09 (×6): 100 mg via ORAL
  Filled 2021-08-03 (×6): qty 1

## 2021-08-03 MED ORDER — LACTATED RINGERS IV SOLN: INTRAVENOUS | Status: DC | PRN

## 2021-08-03 MED ORDER — CEFAZOLIN SODIUM-DEXTROSE 2-4 GM/100ML-% IV SOLN
2.0000 g | Freq: Three times a day (TID) | INTRAVENOUS | Status: DC
Start: 2021-08-03 — End: 2021-08-03

## 2021-08-03 MED ORDER — DEXAMETHASONE SODIUM PHOSPHATE 10 MG/ML IJ SOLN
INTRAMUSCULAR | Status: DC | PRN
  Administered 2021-08-03: 4 mg via INTRAVENOUS

## 2021-08-03 MED ORDER — ROPIVACAINE HCL 5 MG/ML IJ SOLN
INTRAMUSCULAR | Status: DC | PRN
  Administered 2021-08-03: 20 mL via PERINEURAL

## 2021-08-03 MED ORDER — DEXAMETHASONE SODIUM PHOSPHATE 10 MG/ML IJ SOLN
INTRAMUSCULAR | Status: DC | PRN
  Administered 2021-08-03: 5 mg

## 2021-08-03 MED ORDER — MIDAZOLAM HCL 2 MG/2ML IJ SOLN
INTRAMUSCULAR | Status: AC
  Filled 2021-08-03: qty 2

## 2021-08-03 MED ORDER — CHLORHEXIDINE GLUCONATE CLOTH 2 % EX PADS
6.0000 | MEDICATED_PAD | Freq: Every day | CUTANEOUS | Status: DC
  Administered 2021-08-03 – 2021-08-08 (×5): 6 via TOPICAL

## 2021-08-03 MED ORDER — FENTANYL CITRATE (PF) 100 MCG/2ML IJ SOLN
INTRAMUSCULAR | Status: DC | PRN
  Administered 2021-08-03: 50 ug via INTRAVENOUS

## 2021-08-03 MED ORDER — ACETAMINOPHEN 325 MG PO TABS: 325.0000 mg | ORAL_TABLET | Freq: Four times a day (QID) | ORAL | Status: DC | PRN

## 2021-08-03 MED ORDER — GUAIFENESIN-DM 100-10 MG/5ML PO SYRP: 15.0000 mL | ORAL_SOLUTION | ORAL | Status: DC | PRN

## 2021-08-03 MED ORDER — ONDANSETRON HCL 4 MG/2ML IJ SOLN
INTRAMUSCULAR | Status: DC | PRN
  Administered 2021-08-03: 4 mg via INTRAVENOUS

## 2021-08-03 MED ORDER — JUVEN PO PACK
1.0000 | PACK | Freq: Two times a day (BID) | ORAL | Status: DC
  Administered 2021-08-04 – 2021-08-08 (×8): 1 via ORAL
  Filled 2021-08-03 (×7): qty 1

## 2021-08-03 MED ORDER — CHLORHEXIDINE GLUCONATE 0.12 % MT SOLN: 15.0000 mL | Freq: Once | OROMUCOSAL | Status: AC

## 2021-08-03 MED ORDER — PHENOL 1.4 % MT LIQD: 1.0000 | OROMUCOSAL | Status: DC | PRN

## 2021-08-03 MED ORDER — POTASSIUM CHLORIDE CRYS ER 20 MEQ PO TBCR: 20.0000 meq | EXTENDED_RELEASE_TABLET | Freq: Every day | ORAL | Status: DC | PRN

## 2021-08-03 MED ORDER — ACETAMINOPHEN 500 MG PO TABS
1000.0000 mg | ORAL_TABLET | Freq: Once | ORAL | Status: AC
  Administered 2021-08-03: 1000 mg via ORAL

## 2021-08-03 MED ORDER — POLYETHYLENE GLYCOL 3350 17 G PO PACK
17.0000 g | PACK | Freq: Every day | ORAL | Status: DC | PRN
  Filled 2021-08-03: qty 1

## 2021-08-03 MED ORDER — ONDANSETRON HCL 4 MG/2ML IJ SOLN: 4.0000 mg | Freq: Four times a day (QID) | INTRAMUSCULAR | Status: DC | PRN

## 2021-08-03 MED ORDER — POVIDONE-IODINE 10 % EX SWAB: 2.0000 "application " | Freq: Once | CUTANEOUS | Status: DC

## 2021-08-03 MED ORDER — ORAL CARE MOUTH RINSE: 15.0000 mL | Freq: Once | OROMUCOSAL | Status: AC

## 2021-08-03 MED ORDER — ASCORBIC ACID 500 MG PO TABS
1000.0000 mg | ORAL_TABLET | Freq: Every day | ORAL | Status: DC
  Administered 2021-08-03 – 2021-08-09 (×7): 1000 mg via ORAL
  Filled 2021-08-03 (×7): qty 2

## 2021-08-03 MED ORDER — CHLORHEXIDINE GLUCONATE 0.12 % MT SOLN
OROMUCOSAL | Status: AC
  Administered 2021-08-03: 15 mL via OROMUCOSAL
  Filled 2021-08-03: qty 15

## 2021-08-03 SURGICAL SUPPLY — 39 items
BAG COUNTER SPONGE SURGICOUNT (BAG) ×2 IMPLANT
BLADE SAW RECIP 87.9 MT (BLADE) ×4 IMPLANT
BLADE SURG 21 STRL SS (BLADE) ×2 IMPLANT
BNDG COHESIVE 6X5 TAN STRL LF (GAUZE/BANDAGES/DRESSINGS) IMPLANT
CANISTER WOUND CARE 500ML ATS (WOUND CARE) ×2 IMPLANT
CANISTER WOUNDNEG PRESSURE 500 (CANNISTER) ×2 IMPLANT
COVER SURGICAL LIGHT HANDLE (MISCELLANEOUS) ×2 IMPLANT
CUFF TOURN SGL QUICK 42 (TOURNIQUET CUFF) ×2 IMPLANT
DRAPE DERMATAC (DRAPES) ×4 IMPLANT
DRAPE INCISE IOBAN 66X45 STRL (DRAPES) ×4 IMPLANT
DRAPE U-SHAPE 47X51 STRL (DRAPES) ×2 IMPLANT
DRESSING PREVENA PLUS CUSTOM (GAUZE/BANDAGES/DRESSINGS) ×1 IMPLANT
DRSG PREVENA PLUS CUSTOM (GAUZE/BANDAGES/DRESSINGS) ×2
DURAPREP 26ML APPLICATOR (WOUND CARE) ×2 IMPLANT
ELECT REM PT RETURN 9FT ADLT (ELECTROSURGICAL) ×2
ELECTRODE REM PT RTRN 9FT ADLT (ELECTROSURGICAL) ×1 IMPLANT
GLOVE SURG ORTHO LTX SZ9 (GLOVE) ×2 IMPLANT
GLOVE SURG POLY ORTHO LF SZ8 (GLOVE) ×4 IMPLANT
GLOVE SURG UNDER POLY LF SZ9 (GLOVE) ×2 IMPLANT
GOWN STRL REUS W/ TWL XL LVL3 (GOWN DISPOSABLE) ×2 IMPLANT
GOWN STRL REUS W/TWL XL LVL3 (GOWN DISPOSABLE) ×4
KIT BASIN OR (CUSTOM PROCEDURE TRAY) ×2 IMPLANT
KIT TURNOVER KIT B (KITS) ×2 IMPLANT
MANIFOLD NEPTUNE II (INSTRUMENTS) ×2 IMPLANT
NS IRRIG 1000ML POUR BTL (IV SOLUTION) ×2 IMPLANT
PACK ORTHO EXTREMITY (CUSTOM PROCEDURE TRAY) ×2 IMPLANT
PAD ARMBOARD 7.5X6 YLW CONV (MISCELLANEOUS) ×2 IMPLANT
PREVENA RESTOR ARTHOFORM 46X30 (CANNISTER) ×2 IMPLANT
PREVENA RESTOR AXIOFORM 29X28 (GAUZE/BANDAGES/DRESSINGS) ×2 IMPLANT
SPONGE T-LAP 18X18 ~~LOC~~+RFID (SPONGE) ×4 IMPLANT
STAPLER VISISTAT 35W (STAPLE) ×2 IMPLANT
STOCKINETTE IMPERVIOUS LG (DRAPES) ×2 IMPLANT
SUT ETHILON 2 0 PSLX (SUTURE) IMPLANT
SUT SILK 2 0 (SUTURE) ×2
SUT SILK 2-0 18XBRD TIE 12 (SUTURE) ×1 IMPLANT
SUT VIC AB 1 CTX 27 (SUTURE) ×4 IMPLANT
TOWEL GREEN STERILE (TOWEL DISPOSABLE) ×2 IMPLANT
TUBE CONNECTING 12X1/4 (SUCTIONS) ×2 IMPLANT
YANKAUER SUCT BULB TIP NO VENT (SUCTIONS) ×2 IMPLANT

## 2021-08-03 NOTE — Progress Notes (Signed)
    Patient with plan for below-knee amputation with Dr. Sharol Given today.  Vascular surgery will be available as needed  Ily Denno C. Donzetta Matters, MD [facility] Office: [phone] Pager: [phone]

## 2021-08-03 NOTE — Anesthesia Procedure Notes (Addendum)
Anesthesia Regional Block: Popliteal block   Pre-Anesthetic Checklist: , timeout performed,  Correct Patient, Correct Site, Correct Laterality,  Correct Procedure, Correct Position, site marked,  Risks and benefits discussed,  Surgical consent,  Pre-op evaluation,  At surgeon's request and post-op pain management  Laterality: Right  Prep: Maximum Sterile Barrier Precautions used, chloraprep       Needles:  Injection technique: Single-shot  Needle Type: Echogenic Stimulator Needle     Needle Length: 9cm  Needle Gauge: 22     Additional Needles:   Procedures:,,,, ultrasound used (permanent image in chart),,    Narrative:  Start time: 08/03/2021 12:55 PM End time: 08/03/2021 1:02 PM Injection made incrementally with aspirations every 5 mL.  Performed by: Personally  Anesthesiologist: Freddrick March, MD  Additional Notes: Monitors applied. No increased pain on injection. No increased resistance to injection. Injection made in 5cc increments. Good needle visualization. Patient tolerated procedure well.

## 2021-08-03 NOTE — Progress Notes (Signed)
Informed consent for a right BKA and blood have been obtained from pt at this time.

## 2021-08-03 NOTE — Anesthesia Preprocedure Evaluation (Addendum)
Anesthesia Evaluation  Patient identified by MRN, date of birth, ID band Patient awake    Reviewed: Allergy & Precautions, NPO status , Patient's Chart, lab work & pertinent test results, reviewed documented beta blocker date and time   Airway Mallampati: III  TM Distance: >3 FB Neck ROM: Full    Dental  (+) Edentulous Upper, Edentulous Lower, Dental Advisory Given   Pulmonary COPD, former smoker,    Pulmonary exam normal breath sounds clear to auscultation       Cardiovascular hypertension, Pt. on home beta blockers and Pt. on medications + CAD, + Cardiac Stents and +CHF  Normal cardiovascular exam+ dysrhythmias + pacemaker (for CHB)  Rhythm:Regular Rate:Normal  TEE 2022 1. Left ventricular ejection fraction, by estimation, is 55 to 60%. The  left ventricle has normal function.  2. Right ventricular systolic function is normal. The right ventricular  size is normal.  3. Left atrial size was mildly dilated. No left atrial/left atrial  appendage thrombus was detected.  4. The mitral valve is normal in structure. Trivial mitral valve  regurgitation.  5. Tricuspid valve regurgitation is mild to moderate.  6. The aortic valve is normal in structure. Aortic valve regurgitation is  trivial. No aortic stenosis is present.  7. There is Moderate (Grade III) atheroma plaque involving the transverse  and descending aorta.  8. Agitated saline contrast bubble study was positive with shunting  observed within 3-6 cardiac cycles suggestive of interatrial shunt. There  is a small patent foramen ovale with predominantly right to left shunting  across the atrial septum.    Neuro/Psych PSYCHIATRIC DISORDERS Anxiety negative neurological ROS     GI/Hepatic negative GI ROS, Neg liver ROS,   Endo/Other  diabetes, Type 2, Oral Hypoglycemic AgentsObese BMI 38  Renal/GU negative Renal ROS  negative genitourinary    Musculoskeletal negative musculoskeletal ROS (+)   Abdominal   Peds  Hematology  (+) Blood dyscrasia (Hgb 7.9), anemia ,   Anesthesia Other Findings   Reproductive/Obstetrics                            Anesthesia Physical Anesthesia Plan  ASA: 3  Anesthesia Plan: General and Regional   Post-op Pain Management:  Regional for Post-op pain   Induction: Intravenous  PONV Risk Score and Plan: 2 and Ondansetron, Dexamethasone and Midazolam  Airway Management Planned: LMA  Additional Equipment:   Intra-op Plan:   Post-operative Plan: Extubation in OR  Informed Consent: I have reviewed the patients History and Physical, chart, labs and discussed the procedure including the risks, benefits and alternatives for the proposed anesthesia with the patient or authorized representative who has indicated his/her understanding and acceptance.     Dental advisory given  Plan Discussed with: CRNA  Anesthesia Plan Comments:         Anesthesia Quick Evaluation

## 2021-08-03 NOTE — Op Note (Signed)
   Date of Surgery: 08/03/2021  INDICATIONS: [name] is a 70 y.o.-year-old male who has been undergoing wound care at the wound clinic at [facility] long.  Patient presents with chronic osteomyelitis abscess ulceration and air in the soft tissue extending proximal to the ankle.[name]  PREOPERATIVE DIAGNOSIS: Abscess osteomyelitis ulceration right foot  POSTOPERATIVE DIAGNOSIS: Same.  PROCEDURE: Transtibial amputation Application of Prevena wound VAC  SURGEON: Sharol Given, M.D.  ANESTHESIA:  general  IV FLUIDS AND URINE: See anesthesia records.  ESTIMATED BLOOD LOSS: See anesthesia records.  COMPLICATIONS: None.  DESCRIPTION OF PROCEDURE: The patient was brought to the operating room after undergoing regional anesthetic. After adequate levels of anesthesia were obtained patient's lower extremity was prepped using DuraPrep draped into a sterile field. A timeout was called. The foot was draped out of the sterile field with impervious stockinette. A transverse incision was made 11 cm distal to the tibial tubercle. This curved proximally and a large posterior flap was created. The tibia was transected 1 cm proximal to the skin incision. The fibula was transected just proximal to the tibial incision. The tibia was beveled anteriorly. A large posterior flap was created. The sciatic nerve was pulled cut and allowed to retract. The vascular bundles were suture ligated with 2-0 silk. The deep and superficial fascial layers were closed using #1 Vicryl. The skin was closed using staples and 2-0 nylon. The wound was covered with a Prevena customizable and arthroform wound VAC.  The dressing was sealed with dermatac there was a good suction fit. A prosthetic shrinker and limb protector were applied. Patient was taken to the PACU in stable condition.   DISCHARGE PLANNING:  Antibiotic duration: 24 hours  Weightbearing: Nonweightbearing on the operative extremity  Pain medication: Opioid pathway  Dressing care/  Wound VAC: Continue wound VAC for 1 week after discharge  Discharge to: Discharge planning based on therapy's recommendations for possible inpatient rehabilitation, outpatient rehabilitation, or discharge to home with therapy  Follow-up: In the office 1 week post operative.  [name], [name] 3:16 PM

## 2021-08-03 NOTE — Anesthesia Procedure Notes (Addendum)
Anesthesia Regional Block: Adductor canal block   Pre-Anesthetic Checklist: , timeout performed,  Correct Patient, Correct Site, Correct Laterality,  Correct Procedure, Correct Position, site marked,  Risks and benefits discussed,  Surgical consent,  Pre-op evaluation,  At surgeon's request and post-op pain management  Laterality: Right  Prep: Maximum Sterile Barrier Precautions used, chloraprep       Needles:  Injection technique: Single-shot  Needle Type: Echogenic Stimulator Needle     Needle Length: 9cm  Needle Gauge: 22     Additional Needles:   Procedures:,,,, ultrasound used (permanent image in chart),,    Narrative:  Start time: 08/03/2021 1:05 PM End time: 08/03/2021 1:08 PM Injection made incrementally with aspirations every 5 mL.  Performed by: Personally  Anesthesiologist: Freddrick March, MD  Additional Notes: Monitors applied. No increased pain on injection. No increased resistance to injection. Injection made in 5cc increments. Good needle visualization. Patient tolerated procedure well.

## 2021-08-03 NOTE — Consult Note (Signed)
ORTHOPAEDIC CONSULTATION  REQUESTING PHYSICIAN: Darlin Drop, DO  Chief Complaint: Chronic abscess ulceration right foot  HPI: Nathan Miller is a 70 y.o. male who presents with chronic osteomyelitis ulceration and abscess right foot.  Patient states that he has been going to the wound center for years for topical wound care.  Patient has a history of hypertension congestive heart failure he states he has a pacemaker with a rate around 50.  Past Medical History:  Diagnosis Date   CAD (coronary artery disease)    NSTEMI in 2001. Cardiac cath showed: LAD: 40% proximal, LCX: 70% mid, RCA thrombotic occlusion in mid segment. s/p PCI and 2 overlapped BMSs to RCA.    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 by Dr Johney Frame   TEE WITHOUT CARDIOVERSION N/A 04/07/2021   Procedure: TRANSESOPHAGEAL ECHOCARDIOGRAM (TEE);  Surgeon: Parke Poisson, MD;  Location: [facility] ENDOSCOPY;  Service: Cardiology;  Laterality: N/A;   TEMPORARY PACEMAKER INSERTION N/A 01/21/2015   Procedure: TEMPORARY PACEMAKER INSERTION;  Surgeon: Marykay Lex, MD;  Location: [facility] CATH LAB;  Service: Cardiovascular;  Laterality: N/A;   Social History   Socioeconomic History   Marital status: Married    Spouse name: Not on file   Number of children: Not on file   Years of education: Not on file   Highest education level: Not on file  Occupational History   Not on file  Tobacco Use   Smoking status: Former    Packs/day: 0.30    Years: 40.00    Pack years: 12.00    Types:  Cigarettes    Start date: 10/16/1968    Quit date: 10/17/2007    Years since quitting: 13.8   Smokeless tobacco: Current    Types: Snuff  Vaping Use   Vaping Use: Never used  Substance and Sexual Activity   Alcohol use: Yes    Alcohol/week: 42.0 standard drinks    Types: 42 Cans of beer per week    Comment: beer   Drug use: No   Sexual activity: Not on file  Other Topics Concern   Not on file  Social History Narrative   Not on file   Social Determinants of Health   Financial Resource Strain: Not on file  Food Insecurity: Not on file  Transportation Needs: Not on file  Physical Activity: Not on file  Stress: Not on file  Social Connections: Not on file   Family History  Problem Relation Age of Onset   Heart disease Father    COPD Father    Heart disease Paternal Grandfather    - negative except otherwise stated in the family history section No Known Allergies Prior to Admission medications   Medication Sig Start Date End Date Taking? Authorizing Provider  acetaminophen (TYLENOL) 500 MG tablet Take 500 mg by mouth every 6 (six) hours as needed for mild pain or fever.   Yes [provider]  albuterol (VENTOLIN HFA) 108 (90 Base) MCG/ACT inhaler Inhale 1-2 puffs into the  lungs every 6 (six) hours as needed for wheezing or shortness of breath. 07/12/21  Yes [provider]  aspirin EC 81 MG tablet Take 1 tablet (81 mg total) by mouth daily. 01/25/15  Yes Short, Thea Silversmith, MD  atorvastatin (LIPITOR) 80 MG tablet Take 1 tablet (80 mg total) by mouth daily. 05/04/17  Yes Allred, Fayrene Fearing, MD  carvedilol (COREG) 25 MG tablet Take 1 tablet (25 mg total) by mouth 2 (two) times daily. 05/04/17  Yes Allred, Fayrene Fearing, MD  metFORMIN (GLUCOPHAGE) 500 MG tablet Take 1 tablet (500 mg total) by mouth 2 (two) times daily with a meal. 01/25/15  Yes Short, Thea Silversmith, MD  Multiple Vitamins-Minerals (CENTRUM ADULTS) TABS Take 1 tablet by mouth daily.   Yes [provider]   nitroGLYCERIN (NITROSTAT) 0.4 MG SL tablet Place 1 tablet (0.4 mg total) under the tongue every 5 (five) minutes as needed for chest pain. 06/03/21  Yes Allred, Fayrene Fearing, MD  sodium hypochlorite (DAKIN'S 1/2 STRENGTH) external solution Apply 1 application topically daily. 07/29/21  Yes [provider]  SPIRIVA RESPIMAT 2.5 MCG/ACT AERS Inhale 2 puffs into the lungs daily. 07/27/21  Yes [provider]  torsemide (DEMADEX) 20 MG tablet Take 20 mg by mouth daily. 07/21/21  Yes [provider]   CT ANGIO AO+BIFEM W & OR WO CONTRAST  Result Date: 08/02/2021 CLINICAL DATA:  Claudication Nonhealing wound on right foot with increasing drainage over the last few days History of diabetes, hypertension, osteomyelitis EXAM: CT ANGIOGRAPHY OF ABDOMINAL AORTA WITH ILIOFEMORAL RUNOFF TECHNIQUE: Multidetector CT imaging of the abdomen, pelvis and lower extremities was performed using the standard protocol during bolus administration of intravenous contrast. Multiplanar CT image reconstructions and MIPs were obtained to evaluate the vascular anatomy. CONTRAST:  80 mL OMNIPAQUE IOHEXOL 350 MG/ML SOLN COMPARISON:  None. FINDINGS: VASCULAR Aorta: Normal caliber aorta without aneurysm, dissection, vasculitis or significant stenosis. Celiac: Patent without evidence of aneurysm, dissection, vasculitis or significant stenosis. SMA: Patent without evidence of aneurysm, dissection, vasculitis or significant stenosis. Renals: Both renal arteries are patent without evidence of aneurysm, dissection, vasculitis, fibromuscular dysplasia or significant stenosis. IMA: Patent without evidence of aneurysm, dissection, vasculitis or significant stenosis. RIGHT Lower Extremity Inflow: Scattered atheromatous plaque of the common, internal common external iliac arteries without significant stenosis. Outflow: No significant stenosis of the common femoral or profunda femoris arteries. Atherosclerotic calcifications seen  throughout the superficial femoral artery with highest degree of stenosis seen in the distal 1/3 measuring up to 60%. No significant stenosis of the popliteal artery. Runoff: Evaluation of the lower leg arteries is limited due to suboptimal opacification. No significant narrowing of the tibioperoneal trunk is suspected. There is minimal atherosclerotic changes of the lower leg arteries which are likely patent with three-vessel runoff to the right ankle. LEFT Lower Extremity Inflow: Evaluation limited due to suboptimal opacification. Scattered atheromatous plaque seen throughout the common, internal common external iliac arteries without high-grade stenosis. Outflow: Scattered plaque seen throughout the common femoral artery without high-grade stenosis. There is likely severe stenosis at the origin of the profunda femoris artery which is otherwise patent. Multifocal high-grade stenoses seen throughout the mid left superficial femoral artery due to atheromatous plaque. Popliteal artery is diminutive but patent. Runoff: Tibioperoneal trunk is patent. Two vessel runoff to the left ankle through the peroneal and posterior tibial arteries. There is discontinuous opacification of the anterior tibial artery. Veins: No obvious venous abnormality within the limitations of this arterial phase study. Review of the MIP images confirms the  above findings. NON-VASCULAR Lower chest: Trace bilateral pleural effusions. Mild bibasilar atelectasis. Hepatobiliary: No focal liver abnormality is seen. No gallstones, gallbladder wall thickening, or biliary dilatation. Pancreas: Unremarkable. No pancreatic ductal dilatation or surrounding inflammatory changes. Spleen: Normal in size without focal abnormality. Adrenals/Urinary Tract: Adrenal glands are normal. Two simple right renal cysts are seen with the larger measuring 3.3 cm. No signal abnormality of the left kidney, ureters, or bladder. Stomach/Bowel: Extensive diverticulosis of the  descending sigmoid colon without evidence of acute diverticulitis. No bowel dilatation to indicate ileus or obstruction. Lymphatic: Mildly enlarged right inguinal and right common iliac chain lymph nodes are likely inflammatory given ongoing infection in the right foot. Reproductive: Prostate is unremarkable. Other: Large fat containing umbilical hernia. Subcutaneous edema noted within the calves bilaterally. Musculoskeletal: Advanced degenerative changes of the right foot again seen likely due to combination of chronic infection and neuropathic arthropathy. Large wound with subcutaneous emphysema again seen in the hindfoot. The emphysema extends up into the posterior ankle soft tissues. Deformity of the left mid femur consistent with prior healed fracture. Degenerative changes seen throughout the lumbar spine. IMPRESSION: VASCULAR 1. Atherosclerotic changes of the right superficial femoral artery with high degree of stenosis measuring 60%. 2. Multifocal high-grade stenosis of the left superficial femoral artery. 3. Three-vessel runoff to the right ankle. Two vessel runoff to the left ankle. NON-VASCULAR 1. Advanced degenerative changes of the right foot with active ulcer and abscess again seen. 2. Extensive distal colonic diverticulosis without evidence of acute diverticulitis. Electronically Signed   By: Acquanetta Belling M.D.   On: 08/02/2021 09:11   CT FOOT RIGHT W CONTRAST  Result Date: 08/01/2021 CLINICAL DATA:  Right foot infection. EXAM: CT OF THE LOWER RIGHT EXTREMITY WITH CONTRAST TECHNIQUE: Multidetector CT imaging of the lower right extremity was performed according to the standard protocol following intravenous contrast administration. CONTRAST:  80mL OMNIPAQUE IOHEXOL 350 MG/ML SOLN COMPARISON:  Right foot x-rays from same day. CT right foot dated Mar 23, 2017. FINDINGS: Bones/Joint/Cartilage No bony destruction. Unchanged chronic periosteal reaction of the distal tibia and fibula. 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 Similar subtalar and Chopart joint ankylosis. Osteopenia. No joint effusion. Ligaments Ligaments are suboptimally evaluated by CT. Muscles and Tendons Severely atrophy. Soft tissue Progressive deep soft tissue ulceration along the lateral and plantar aspect of the hindfoot. Prominent phlegmonous change around the ulceration without well-defined drainable fluid collection. A small amount of subcutaneous emphysema tracks superiorly into the superficial posterolateral distal lower leg. Diffuse skin thickening and soft tissue swelling. No soft tissue mass. IMPRESSION: 1. Progressive deep soft tissue ulceration along the lateral and plantar aspect of the hindfoot with surrounding prominent phlegmonous change but without well-defined drainable fluid collection. Small amount of subcutaneous emphysema tracks superiorly into the superficial posterolateral distal lower leg, most likely associated with the patient's wound rather than necrotizing infection. 2. No CT evidence of acute osteomyelitis. 3. 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 Electronically Signed   By: Obie Dredge M.D.   On: 08/01/2021 14:06   DG Foot Complete Right  Result Date: 08/01/2021 CLINICAL DATA:  Purulent drainage from RIGHT foot wound, increased swelling and redness; [facility] to ED for RIGHT foot infection, history of chronic osteomyelitis EXAM: RIGHT FOOT COMPLETE - 3+ VIEW COMPARISON:  04/05/2021 FINDINGS: Osseous demineralization. Marked soft tissue swelling. Foci of soft tissue gas are identified at the posterior aspect of  the RIGHT ankle extending into plantar aspect of foot laterally. Findings consistent with either penetrating wound or soft tissue infection by gas-forming organism. Extensive degenerative changes deformity throughout the tarsals to MTP joints. Bone destruction at proximal  RIGHT fifth meta tarsal and distal lateral cuboid again seen. Nonunion of old fracture at lateral base of proximal phalanx great toe. No acute fracture or bone destruction definitely visualized. IMPRESSION: Extensive soft tissue swelling with foci of soft tissue gas at the lateral heel and posterior ankle either representing penetrating wound or infection by a gas-forming organism. Extensive degenerative changes and deformities throughout RIGHT foot similar to previous exam. No definite acute fracture or new bone destruction identified. Findings called to [facility] on 08/01/2021 at 1104 hours. Electronically Signed   By: Ulyses Southward M.D.   On: 08/01/2021 10:53   VAS [place] ABI WITH/WO TBI  Result Date: 08/01/2021  LOWER EXTREMITY DOPPLER STUDY Patient Name:  CHRISTO WITTS  Date of Exam:   08/01/2021 Medical Rec #: [id number]     Accession #:    [id number] Date of Birth: 1951-08-06    Patient Gender: M Patient Age:   37 years Exam Location:  [facility] Procedure:      VAS [place] ABI WITH/WO TBI Referring Phys: Margie Ege --------------------------------------------------------------------------------  Indications: Ulceration. High Risk Factors: Diabetes.  Comparison Study: No prior studies. Performing Technologist: Olen Cordial RVT  Examination Guidelines: A complete evaluation includes at minimum, Doppler waveform signals and systolic blood pressure reading at the level of bilateral brachial, anterior tibial, and posterior tibial arteries, when vessel segments are accessible. Bilateral testing is considered an integral part of a complete examination. Photoelectric Plethysmograph (PPG) waveforms and toe systolic pressure readings are included as required and additional duplex testing as needed. Limited examinations for reoccurring indications may be performed as noted.  ABI Findings: +---------+------------------+-----+----------+--------+ Right    Rt Pressure (mmHg)IndexWaveform  Comment   +---------+------------------+-----+----------+--------+ Brachial 141                    triphasic          +---------+------------------+-----+----------+--------+ PTA      110               0.75 monophasic         +---------+------------------+-----+----------+--------+ DP       116               0.79 biphasic           +---------+------------------+-----+----------+--------+ Great Toe58                0.40                    +---------+------------------+-----+----------+--------+ +---------+------------------+-----+---------+-------+ Left     Lt Pressure (mmHg)IndexWaveform Comment +---------+------------------+-----+---------+-------+ Brachial 146                    triphasic        +---------+------------------+-----+---------+-------+ PTA      126               0.86 biphasic         +---------+------------------+-----+---------+-------+ DP       114               0.78 biphasic         +---------+------------------+-----+---------+-------+ Great Toe63                0.43                  +---------+------------------+-----+---------+-------+ +-------+-----------+-----------+------------+------------+  ABI/TBIToday's ABIToday's TBIPrevious ABIPrevious TBI +-------+-----------+-----------+------------+------------+ Right  0.79       0.4                                 +-------+-----------+-----------+------------+------------+ Left   0.86       0.43                                +-------+-----------+-----------+------------+------------+  Summary: Right: Resting right ankle-brachial index indicates moderate right lower extremity arterial disease. The right toe-brachial index is abnormal. Left: Resting left ankle-brachial index indicates mild left lower extremity arterial disease. The left toe-brachial index is abnormal.  *See table(s) above for measurements and observations.  Electronically signed by Lemar Livings MD on 08/01/2021 at 4:54:58  PM.    Final    - pertinent xrays, CT, MRI studies were reviewed and independently interpreted  Positive ROS: All other systems have been reviewed and were otherwise negative with the exception of those mentioned in the HPI and as above.  Physical Exam: General: Alert, no acute distress Psychiatric: Patient is competent for consent with normal mood and affect Lymphatic: No axillary or cervical lymphadenopathy Cardiovascular: No pedal edema Respiratory: No cyanosis, no use of accessory musculature GI: No organomegaly, abdomen is soft and non-tender    Images:  @ENCIMAGES @  Labs:  Lab Results  Component Value Date   HGBA1C 5.5 08/02/2021   HGBA1C 6.4 (H) 03/31/2021   HGBA1C 13.4 (H) 01/22/2015   ESRSEDRATE 55 (H) 04/12/2021   ESRSEDRATE 2 01/22/2015   ESRSEDRATE 25 (H) 03/20/2011   CRP 2.6 (H) 04/12/2021   CRP 1.2 (H) 03/20/2011   CRP 22.4 (H) 02/03/2011   REPTSTATUS PENDING 08/01/2021   REPTSTATUS PENDING 08/01/2021   GRAMSTAIN  08/01/2021    NO ORGANISMS SEEN GRAM POSITIVE COCCI MODERATE WBC PRESENT,BOTH PMN AND MONONUCLEAR FEW GRAM POSITIVE COCCI RARE GRAM POSITIVE RODS    CULT  08/01/2021    NO GROWTH 2 DAYS Performed at [facility], [address] [place], [place] [address]    CULT  08/01/2021    NO GROWTH 2 DAYS Performed at [facility], [address], [place], [place] [address]    [facility] STREPTOCOCCUS ANGINOSIS 04/04/2021       CBC EXTENDED Latest Ref Rng & Units 08/03/2021 08/02/2021 08/01/2021  WBC 4.0 - 10.5 K/uL 6.8 7.9 11.3(H)  RBC 4.22 - 5.81 MIL/uL 2.75(L) 2.98(L) 2.92(L)  HGB 13.0 - 17.0 g/dL 7.9(L) 8.2(L) 8.2(L)  HCT 39.0 - 52.0 % 24.5(L) 27.2(L) 26.7(L)  PLT 150 - 400 K/uL 234 252 254  NEUTROABS 1.7 - 7.7 K/uL - - 8.3(H)  LYMPHSABS 0.7 - 4.0 K/uL - - 1.3    Neurologic: Patient does not have protective sensation bilateral lower extremities.   MUSCULOSKELETAL:   Skin: Examination patient has cellulitis and ulceration of  the right foot.  There is a ulcer over the calcaneus with a large cavernous ulcer that is directly down to the calcaneus.  The calcaneus is chronic destructive bony changes.  Patient does not have palpable dorsalis pedis pulses bilaterally this may be due to his swelling in both lower extremities from his congestive heart failure.  Review of the radiographs shows air in the soft tissue extending proximal to the ankle with the abscess extending proximal to the ankle.  The CT scan shows advanced destructive osteomyelitis of the  right foot involving essentially the entire foot.  Patient's hemoglobin is 7.9 with a white cell count of 6.8.  Albumin 2.9 and a hemoglobin A1c of 5.5.  Review of the ankle-brachial indices shows a ABI diminished on the right lower extremity.  Monophasic, 0.75.  This is probably falsely elevated due to the calcification of the arteries.  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  Plan: Plan: We will plan for a right transtibial amputation.  Have reviewed this with vascular surgery and they will cancel his arterial study.  I will type and cross for transfusion of 1 unit packed red blood cells.  I discussed with the patient this is a life-threatening infection.  Thank you for the consult and the opportunity to see Mr. Casimier Dumitrescu, MD [facility] [phone] 7:53 AM

## 2021-08-03 NOTE — Transfer of Care (Signed)
Immediate Anesthesia Transfer of Care Note  Patient: Nathan Miller  Procedure(s) Performed: AMPUTATION BELOW KNEE (Right: Knee)  Patient Location: PACU  Anesthesia Type:GA combined with regional for post-op pain  Level of Consciousness: awake and alert   Airway & Oxygen Therapy: Patient Spontanous Breathing and Patient connected to nasal cannula oxygen  Post-op Assessment: Report given to RN and Post -op Vital signs reviewed and stable  Post vital signs: Reviewed and stable  Last Vitals:  Vitals Value Taken Time  BP 115/62 08/03/21 1510  Temp    Pulse 55 08/03/21 1512  Resp 20 08/03/21 1512  SpO2 94 % 08/03/21 1512  Vitals shown include unvalidated device data.  Last Pain:  Vitals:   08/03/21 1207  TempSrc: Oral  PainSc:          Complications: No notable events documented.

## 2021-08-03 NOTE — Plan of Care (Addendum)
Pt is A&O x4. Patient has no complaints of pain at this time. Pt has been placed on NPO with the exception of a sip of water for COREG and his insulin, at 0838.   Problem: Education: Goal: Knowledge of General Education information will improve Description: Including pain rating scale, medication(s)/side effects and non-pharmacologic comfort measures Outcome: Progressing   Problem: Health Behavior/Discharge Planning: Goal: Ability to manage health-related needs will improve Outcome: Progressing   Problem: Clinical Measurements: Goal: Ability to maintain clinical measurements within normal limits will improve Outcome: Progressing Goal: Will remain free from infection Outcome: Progressing Goal: Diagnostic test results will improve Outcome: Progressing Goal: Respiratory complications will improve Outcome: Progressing Goal: Cardiovascular complication will be avoided Outcome: Progressing   Problem: Activity: Goal: Risk for activity intolerance will decrease Outcome: Progressing   Problem: Nutrition: Goal: Adequate nutrition will be maintained Outcome: Progressing   Problem: Coping: Goal: Level of anxiety will decrease Outcome: Progressing   Problem: Elimination: Goal: Will not experience complications related to bowel motility Outcome: Progressing Goal: Will not experience complications related to urinary retention Outcome: Progressing   Problem: Pain Managment: Goal: General experience of comfort will improve Outcome: Progressing   Problem: Safety: Goal: Ability to remain free from injury will improve Outcome: Progressing   Problem: Skin Integrity: Goal: Risk for impaired skin integrity will decrease Outcome: Progressing

## 2021-08-03 NOTE — Progress Notes (Signed)
PROGRESS NOTE  Nathan Miller:[id number] DOB: 02/19/1951 DOA: 08/01/2021 PCP: [facility]  HPI/Recap of past 24 hours:  Nathan Miller is a 70 y.o. male with a history of CAD, diabetes mellitus, type 2, chronic lymphedema, complete heart block s/p PPM, chronic foot wound followed by wound care. Patient presented secondary to purulent drainage from right foot and found to have acute infection with initial concern for possibly necrotizing infection. CT imaging not completely suggestive, but patient covered by IV vancomycin, Zosyn, IV clindamycin.  Seen by orthopedic surgery, Dr. Lajoyce Corners, planning for right transtibial amputation on 08/03/2021.  08/03/2021: Patient was seen and examined at his bedside.  He denies have any pain in his right foot.  He has no other complaints at this time.  Assessment/Plan: Principal Problem:   Diabetic foot infection (HCC) Active Problems:   Subacute osteomyelitis, right ankle and foot (HCC)   Essential hypertension   Complete heart block (HCC)   Right foot ulcer (HCC)   Diabetes type 2, controlled (HCC)   Obesity with body mass index (BMI) of 30.0 to 39.9  Right diabetic foot wound Patient has a history of treated right foot osteomyelitis. This admission, vascular surgery consulted. X-ray concerning for gas-producing organism, however CT foot significant for no drainable fluid collection and small amount of subcutaneous emphysema less concerning for necrotizing infection. CTA abdomen obtained by vascular surgery significant for right superficial femoral stenosis of high degree measuring 60% in addition to multifocal high-grade stenosis of left superficial femoral artery. -Vascular surgery recommendations: plan for aortogram tomorrow (10/5)-patient was seen by orthopedic surgery, plan for right transtibial amputation on 08/03/2021. -Continue Vancomycin/Zosyn/Clindamycin   PAD Contributing to above. Plan as mentioned above.    Diabetes mellitus, type 2 Hemoglobin A1C of 5.5%. Patient is on metformin as an outpatient which is held on admission. -Continue SSI -Carb modified diet   Essential hypertension On Coreg PTA Continue to monitor vital signs   CAD Denies any anginal symptoms. -Continue aspirin and Lipitor   Diastolic heart dysfunction Grade III dysfunction with normal EF. -Continue torsemide Strict I's and O's and daily weight   Chronic normocytic anemia Prior baseline of about 12. Down to 8.2 this admission. Unsure of etiology. Patient declined FOBT in the ED. -AM iron/TIBC/ferritin -Recommend FOBT   Complete heart block S/p PPM   Hyperlipidemia -Continue Lipitor   Tobacco use Tobacco cessation counseling on admission.   COPD -Continue Spiriva and Albuterol   Diverticulosis Noted on CT imaging. No symptoms.   Morbid obesity Body mass index is 38.4 kg/m.   DVT prophylaxis: Lovenox subcu daily. Code Status:   Full code. Family Communication: None at bedside Disposition Plan: Undetermined.     Consultants:  Vascular surgery [facility] surgery (curbside)   Procedures:  None   Antimicrobials: Vancomycin Cefepime Zosyn Clindamycin     Objective: Vitals:   08/02/21 2112 08/03/21 0049 08/03/21 0752 08/03/21 0842  BP: (!) 144/62 (!) 95/45 137/61   Pulse: (!) 51 (!) 52 65   Resp: 18 17 18    Temp: 98.6 F (37 C) 98 F (36.7 C) 97.7 F (36.5 C)   TempSrc:      SpO2: 99% 94% 100% 100%  Weight:      Height:        Intake/Output Summary (Last 24 hours) at 08/03/2021 1104 Last data filed at 08/03/2021 0900 Gross per 24 hour  Intake 453.38 ml  Output --  Net 453.38 ml   [employer]  08/02/21 1313  Weight: 117.9 kg    Exam:  General: 70 y.o. year-old male well developed well nourished in no acute distress.  Alert and oriented x3. Cardiovascular: Regular rate and rhythm with no rubs or gallops.  No thyromegaly or JVD noted.   Respiratory: Clear  to auscultation with no wheezes or rales. Good inspiratory effort. Abdomen: Soft nontender nondistended with normal bowel sounds. Musculoskeletal: Trace lower extremity edema.  Skin: Bilateral lower extremity venous stasis changes. Psychiatry: Mood is appropriate for condition and setting   Data Reviewed: CBC: Recent Labs  Lab 08/01/21 1023 08/02/21 1002 08/03/21 0234  WBC 11.3* 7.9 6.8  NEUTROABS 8.3*  --   --   HGB 8.2* 8.2* 7.9*  HCT 26.7* 27.2* 24.5*  MCV 91.4 91.3 89.1  PLT 254 252 234   Basic Metabolic Panel: Recent Labs  Lab 08/01/21 1023 08/02/21 1002 08/03/21 0234  NA 131* 132* 134*  K 3.9 3.7 3.4*  CL 88* 90* 91*  CO2 35* 32 33*  GLUCOSE 120* 110* 109*  BUN 18 15 12   CREATININE 0.99 0.83 1.07  CALCIUM 8.5* 8.5* 8.4*   GFR: Estimated Creatinine Clearance: 84.9 mL/min (by C-G formula based on SCr of 1.07 mg/dL). Liver Function Tests: Recent Labs  Lab 08/02/21 1002  AST 20  ALT 14  ALKPHOS 82  BILITOT 1.4*  PROT 7.2  ALBUMIN 2.9*   No results for input(s): LIPASE, AMYLASE in the last 168 hours. No results for input(s): AMMONIA in the last 168 hours. Coagulation Profile: No results for input(s): INR, PROTIME in the last 168 hours. Cardiac Enzymes: No results for input(s): CKTOTAL, CKMB, CKMBINDEX, TROPONINI in the last 168 hours. BNP (last 3 results) No results for input(s): PROBNP in the last 8760 hours. HbA1C: Recent Labs    08/02/21 1002  HGBA1C 5.5   CBG: Recent Labs  Lab 08/02/21 1238 08/02/21 1715 08/03/21 0656  GLUCAP 99 129* 149*   Lipid Profile: No results for input(s): CHOL, HDL, LDLCALC, TRIG, CHOLHDL, LDLDIRECT in the last 72 hours. Thyroid Function Tests: No results for input(s): TSH, T4TOTAL, FREET4, T3FREE, THYROIDAB in the last 72 hours. Anemia Panel: Recent Labs    08/03/21 0234  FERRITIN 61  TIBC 258  IRON 23*   Urine analysis:    Component Value Date/Time   COLORURINE AMBER (A) 04/04/2021 1338    APPEARANCEUR CLOUDY (A) 04/04/2021 1338   LABSPEC 1.021 04/04/2021 1338   PHURINE 5.0 04/04/2021 1338   GLUCOSEU NEGATIVE 04/04/2021 1338   HGBUR NEGATIVE 04/04/2021 1338   BILIRUBINUR NEGATIVE 04/04/2021 1338   KETONESUR NEGATIVE 04/04/2021 1338   PROTEINUR 30 (A) 04/04/2021 1338   UROBILINOGEN 1.0 06/13/2015 2104   NITRITE NEGATIVE 04/04/2021 1338   LEUKOCYTESUR NEGATIVE 04/04/2021 1338   Sepsis Labs: @LABRCNTIP (procalcitonin:4,lacticidven:4)  ) Recent Results (from the past 240 hour(s))  Aerobic/Anaerobic Culture w Gram Stain (surgical/deep wound)     Status: None (Preliminary result)   Collection Time: 08/01/21  8:00 AM   Specimen: Foot  Result Value Ref Range Status   Specimen Description   Final    FOOT RIGHT PLANTAR Performed at [facility], [address], [place], [place] [address]    Special Requests   Final    NONE Performed at [facility], [address], [place], [place] [address]    Gram Stain   Final    NO ORGANISMS SEEN GRAM POSITIVE COCCI MODERATE WBC PRESENT,BOTH PMN AND MONONUCLEAR FEW GRAM POSITIVE COCCI RARE GRAM POSITIVE  RODS    Culture   Final    FEW STAPHYLOCOCCUS AUREUS CULTURE REINCUBATED FOR BETTER GROWTH Performed at [facility], [address] [place], [place] [address]    Report Status PENDING  Incomplete  Culture, blood (routine x 2)     Status: None (Preliminary result)   Collection Time: 08/01/21  1:24 PM   Specimen: BLOOD  Result Value Ref Range Status   Specimen Description   Final    BLOOD LEFT ANTECUBITAL Performed at [facility], [address] [place], [place] [address]    Special Requests   Final    BOTTLES DRAWN AEROBIC AND ANAEROBIC Blood Culture adequate volume Performed at [facility], [address] [place], [place] [address]    Culture   Final    NO GROWTH 2 DAYS Performed at [facility], [address], [place],  [place] [address]    Report Status PENDING  Incomplete  Culture, blood (routine x 2)     Status: None (Preliminary result)   Collection Time: 08/01/21  1:24 PM   Specimen: BLOOD  Result Value Ref Range Status   Specimen Description   Final    BLOOD RIGHT ANTECUBITAL Performed at [facility], [address] [place], [place] [address]    Special Requests   Final    BOTTLES DRAWN AEROBIC AND ANAEROBIC Blood Culture results may not be optimal due to an excessive volume of blood received in culture bottles Performed at [facility], [address], [place], [place] [address]    Culture   Final    NO GROWTH 2 DAYS Performed at [facility], [address], [place], [place] [address]    Report Status PENDING  Incomplete  SARS CORONAVIRUS 2 (TAT 6-24 HRS) Nasopharyngeal Nasopharyngeal Swab     Status: None   Collection Time: 08/01/21  2:54 PM   Specimen: Nasopharyngeal Swab  Result Value Ref Range Status   SARS Coronavirus 2 NEGATIVE NEGATIVE Final    Comment: (NOTE) SARS-CoV-2 target nucleic acids are NOT DETECTED.  The SARS-CoV-2 RNA is generally detectable in upper and lower respiratory specimens during the acute phase of infection. Negative results do not preclude SARS-CoV-2 infection, do not rule out co-infections with other pathogens, and should not be used as the sole basis for treatment or other patient management decisions. Negative results must be combined with clinical observations, patient history, and epidemiological information. The expected result is Negative.  Fact Sheet for Patients: HairSlick.no  Fact Sheet for Healthcare Providers: quierodirigir.com  This test is not yet approved or cleared by the [place] FDA and  has been authorized for detection and/or diagnosis of SARS-CoV-2 by FDA under an Emergency Use Authorization (EUA). This EUA will remain  in effect  (meaning this test can be used) for the duration of the COVID-19 declaration under Se ction 564(b)(1) of the Act, 21 U.S.C. section 360bbb-3(b)(1), unless the authorization is terminated or revoked sooner.  Performed at [facility], [address] [place], [place] [address]       Studies: No results found.  Scheduled Meds:  aspirin EC  81 mg Oral Daily   atorvastatin  80 mg Oral Daily   carvedilol  25 mg Oral BID   enoxaparin (LOVENOX) injection  40 mg Subcutaneous Q24H   insulin aspart  0-15 Units Subcutaneous TID WC   potassium chloride  40 mEq Oral Once   torsemide  20 mg Oral Daily   umeclidinium bromide  1 puff Inhalation Daily    Continuous Infusions:  sodium chloride 100 mL/hr at 08/03/21 0623   clindamycin (CLEOCIN) IV 900 mg (08/03/21 0625)   piperacillin-tazobactam (ZOSYN)  IV Stopped (08/03/21 0309)   vancomycin 200 mL/hr at 08/03/21 0438     LOS: 2 days     Darlin Drop, MD [facility] Pager [phone]  If 7PM-7AM, please contact night-coverage [email or web address] Password [email or web address] 08/03/2021, 11:04 AM

## 2021-08-03 NOTE — Progress Notes (Signed)
Orthopedic Tech Progress Note Patient Details:  JANSSEN ZEE Feb 23, 1951 [id number]  Called in order to [employer] for a VIVE PROTOCOL BK   Patient ID: ERICKSON YAMASHIRO, male   DOB: 1951-05-12, 69 y.o.   MRN: [id number]  Janit Pagan 08/03/2021, 5:03 PM

## 2021-08-03 NOTE — Plan of Care (Signed)

## 2021-08-04 ENCOUNTER — Encounter (HOSPITAL_COMMUNITY): Payer: Self-pay | Admitting: Orthopedic Surgery

## 2021-08-04 LAB — CBC
HCT: 24 % — ABNORMAL LOW (ref 39.0–52.0)
Hemoglobin: 7.3 g/dL — ABNORMAL LOW (ref 13.0–17.0)
MCH: 27.5 pg (ref 26.0–34.0)
MCHC: 30.4 g/dL (ref 30.0–36.0)
MCV: 90.6 fL (ref 80.0–100.0)
Platelets: 231 10*3/uL (ref 150–400)
RBC: 2.65 MIL/uL — ABNORMAL LOW (ref 4.22–5.81)
RDW: 15.3 % (ref 11.5–15.5)
WBC: 7.8 10*3/uL (ref 4.0–10.5)
nRBC: 0 % (ref 0.0–0.2)

## 2021-08-04 LAB — PREPARE RBC (CROSSMATCH)

## 2021-08-04 LAB — BASIC METABOLIC PANEL
Anion gap: 9 (ref 5–15)
BUN: 9 mg/dL (ref 8–23)
CO2: 33 mmol/L — ABNORMAL HIGH (ref 22–32)
Calcium: 8.1 mg/dL — ABNORMAL LOW (ref 8.9–10.3)
Chloride: 91 mmol/L — ABNORMAL LOW (ref 98–111)
Creatinine, Ser: 1.07 mg/dL (ref 0.61–1.24)
GFR, Estimated: >60 mL/min (ref >60)
Glucose, Bld: 207 mg/dL — ABNORMAL HIGH (ref 70–99)
Potassium: 3.6 mmol/L (ref 3.5–5.1)
Sodium: 133 mmol/L — ABNORMAL LOW (ref 135–145)

## 2021-08-04 LAB — GLUCOSE, CAPILLARY
Glucose-Capillary: 147 mg/dL — ABNORMAL HIGH (ref 70–99)
Glucose-Capillary: 145 mg/dL — ABNORMAL HIGH (ref 70–99)
Glucose-Capillary: 141 mg/dL — ABNORMAL HIGH (ref 70–99)
Glucose-Capillary: 152 mg/dL — ABNORMAL HIGH (ref 70–99)

## 2021-08-04 MED ORDER — SODIUM CHLORIDE 0.9% IV SOLUTION: Freq: Once | INTRAVENOUS | Status: DC

## 2021-08-04 MED ORDER — FERROUS SULFATE 325 (65 FE) MG PO TABS
325.0000 mg | ORAL_TABLET | Freq: Every day | ORAL | Status: DC
  Administered 2021-08-05 – 2021-08-09 (×5): 325 mg via ORAL
  Filled 2021-08-04 (×5): qty 1

## 2021-08-04 MED ORDER — SODIUM CHLORIDE 0.9 % IV SOLN: INTRAVENOUS | Status: DC

## 2021-08-04 NOTE — Care Management Important Message (Signed)
Important Message  Patient Details  Name: Nathan Miller MRN: [id number] Date of Birth: [birth year]   Medicare Important Message Given:  Yes     Orbie Pyo 08/04/2021, 3:06 PM

## 2021-08-04 NOTE — Progress Notes (Signed)
Inpatient Rehab Admissions Coordinator:   Consult received and chart reviewed.  Note that HTA unlikely to approve CIR for this diagnosis.  Would recommend f/u therapy at a lower level of care per therapy recommendations.  Evals pending.  CIR will sign off at this time.   Shann Medal, PT, DPT Admissions Coordinator [phone] 08/04/21  10:59 AM

## 2021-08-04 NOTE — Progress Notes (Signed)
Patient ID: Nathan Miller, male   DOB: 04/25/1951, 70 y.o.   MRN: [id number] Patient is postoperative day 1 right transtibial amputation.  There is no drainage in the wound VAC canister there is a good suction fit.  Patient states that he has no pain but did take 1 pain pill last night just in case.  Physical therapy to determine discharge planning.  Orders are placed for inpatient and outpatient rehab.

## 2021-08-04 NOTE — Progress Notes (Signed)
Occupational Therapy Evaluation Patient Details Name: Nathan Miller MRN: [id number] DOB: 1951-08-24 Today's Date: 08/04/2021   History of Present Illness 70 y.o. male admitted 08/01/21 with chronic R foot wound; workup for R foot ascess osteomyelitis ulceration. 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 PMH includes CAD, DM2, lymphedema, pacemaker, HTN, CHF, DM, osteomyelitis.   Clinical Impression   PTA, pt was living with his wife and was performing BADLs and using SPC for mobility; pt with limited mobility due to weak L knee. Pt currently requiring Min A for UB ADLs, Max A for LB ADLs, and Mod-Max A +2 for functional transfers. Pt highly motivated to participate in therapy. Pt would benefit from further acute OT to facilitate safe dc. Recommend dc to SNF for further OT to optimize safety, independence with ADLs, and return to PLOF.      Recommendations for follow up therapy are one component of a multi-disciplinary discharge planning process, led by the attending physician.  Recommendations may be updated based on patient status, additional functional criteria and insurance authorization.   Follow Up Recommendations  SNF;Supervision/Assistance - 24 hour    Equipment Recommendations  Other (comment) (Defer to next venue)    Recommendations for Other Services PT consult     Precautions / Restrictions Precautions Precautions: Fall;Other (comment) Precaution Comments: R BKA wound vac Required Braces or Orthoses: Other Brace Other Brace: R BKA limb guard Restrictions Weight Bearing Restrictions: Yes RLE Weight Bearing: Non weight bearing      Mobility Bed Mobility Overal bed mobility: Needs Assistance Bed Mobility: Supine to Sit     Supine to sit: Supervision;HOB elevated     General bed mobility comments: Increased time and effort, reliant on use of bed rail, supervision for safety/lines    Transfers Overall transfer level: Needs assistance Equipment used: Rolling walker (2  wheeled);2 person hand held assist Transfers: Sit to/from [employer] Sit to Stand: Mod assist;+2 physical assistance   Squat pivot transfers: Max assist;+2 physical assistance     General transfer comment: ModA+2 for trunk elevation and stability standing from slightly raised EOB to RW, cues for sequencing and use of momentum to power up; pt requiring return to sit with c/o feeling weak and fatigue, maxA+2 for squat pivot to recliner towards L-side, pt with c/o lightheadedness    Balance Overall balance assessment: Needs assistance Sitting-balance support: No upper extremity supported;Feet supported Sitting balance-Leahy Scale: Fair     Standing balance support: Bilateral upper extremity supported;During functional activity Standing balance-Leahy Scale: Zero                             ADL either performed or assessed with clinical judgement   ADL Overall ADL's : Needs assistance/impaired Eating/Feeding: Set up;Sitting   Grooming: Set up;Sitting   Upper Body Bathing: Minimal assistance;Sitting   Lower Body Bathing: Maximal assistance;Sit to/from stand   Upper Body Dressing : Minimal assistance;Sitting   Lower Body Dressing: Maximal assistance;Sit to/from stand   Toilet Transfer: Maximal assistance;+2 for physical assistance;Stand-pivot;RW           Functional mobility during ADLs: Maximal assistance;+2 for physical assistance General ADL Comments: Pt with decreased balance, strength, and ROM.     Vision         Perception     Praxis      Pertinent Vitals/Pain Pain Assessment: Faces Faces Pain Scale: Hurts a little bit Pain Location: R residual limb Pain Descriptors / Indicators: Discomfort Pain  Intervention(s): Monitored during session;Limited activity within patient's tolerance;Repositioned     Hand Dominance Right   Extremity/Trunk Assessment Upper Extremity Assessment Upper Extremity Assessment: Generalized weakness    Lower Extremity Assessment Lower Extremity Assessment: Defer to PT evaluation RLE Deficits / Details: s/p R transtibial amputation LLE Deficits / Details: H/o chronic L knee issues, strength at least 3/5 throughout       Communication Communication Communication: No difficulties   Cognition Arousal/Alertness: Awake/alert Behavior During Therapy: WFL for tasks assessed/performed Overall Cognitive Status: Within Functional Limits for tasks assessed                                     General Comments  Noted pallor with upright activity and pt c/o feeling lightheaded, post-mobility BP 124/51 (MAP 72). SpO2 89-92% on RA    Exercises     Shoulder Instructions      Home Living Family/patient expects to be discharged to:: Private residence Living Arrangements: Spouse/significant other Available Help at Discharge: Family;Available 24 hours/day Type of Home: Mobile home Home Access: Stairs to enter           Bathroom Shower/Tub: Chief Strategy Officer: Standard     Home Equipment: Environmental consultant - 2 wheels;Cane - single point;Other (comment) (lift chair)          Prior Functioning/Environment Level of Independence: Needs assistance  Gait / Transfers Assistance Needed: Mod indep limited household and community ambulator with SPC. Pt typically sits in lift chair; h/o chronic L knee pain (wheres brace to walk sometimes), therefore used to RLE being dominant. Drives ADL's / Homemaking Assistance Needed: Wife assists with dressing (lower body) and bathing (reaching backside) as needed; wife does majority of household tasks; pt takes basin baths sitting on EOB   Comments: Wife does not drive but working to get license renewed        OT Problem List: Decreased strength;Decreased range of motion;Decreased activity tolerance;Impaired balance (sitting and/or standing);Decreased knowledge of use of DME or AE;Decreased knowledge of precautions;Pain      OT  Treatment/Interventions: Self-care/ADL training;Therapeutic exercise;Energy conservation;DME and/or AE instruction;Therapeutic activities;Patient/family education    OT Goals(Current goals can be found in the care plan section) Acute Rehab OT Goals Patient Stated Goal: Post-acute rehab at SNF near his home in [place] OT Goal Formulation: With patient Time For Goal Achievement: 08/18/21 Potential to Achieve Goals: Good  OT Frequency: Min 2X/week   Barriers to D/C:            Co-evaluation              AM-PAC OT "6 Clicks" Daily Activity     Outcome Measure Help from another person eating meals?: None Help from another person taking care of personal grooming?: A Little Help from another person toileting, which includes using toliet, bedpan, or urinal?: A Lot Help from another person bathing (including washing, rinsing, drying)?: A Lot Help from another person to put on and taking off regular upper body clothing?: A Little Help from another person to put on and taking off regular lower body clothing?: A Lot 6 Click Score: 16   End of Session Equipment Utilized During Treatment: Gait belt;Rolling walker Nurse Communication: Mobility status  Activity Tolerance: Patient tolerated treatment well Patient left: in chair;with call bell/phone within reach;with chair alarm set  OT Visit Diagnosis: Unsteadiness on feet (R26.81);Other abnormalities of gait and mobility (R26.89);Muscle weakness (generalized) (M62.81);Pain  Pain - Right/Left: Right Pain - part of body: Leg                Time: 1610-9604 OT Time Calculation (min): 31 min Charges:  OT General Charges $OT Visit: 1 Visit OT Evaluation $OT Eval Moderate Complexity: 1 Mod  Consuela Widener MSOT, OTR/L Acute Rehab Pager: [phone] Office: [phone]  Theodoro Grist Ninah Moccio 08/04/2021, 1:34 PM

## 2021-08-04 NOTE — Evaluation (Signed)
Physical Therapy Evaluation Patient Details Name: Nathan Miller MRN: [id number] DOB: 01-18-1951 Today's Date: 08/04/2021  History of Present Illness  Pt is a 70 y.o. male admitted 08/01/21 with chronic R foot wound; workup for R foot ascess osteomyelitis ulceration. 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 PMH includes CAD, DM2, lymphedema, pacemaker, HTN, CHF, DM, osteomyelitis.   Clinical Impression  Pt presents with an overall decrease in functional mobility secondary to above. PTA, pt limited community ambulatory with [facility], lives with wife who assists with ADLs and household tasks as needed. Educ on precautions, positioning, activity recommendations and importance of mobility. Today, pt able to initiate standing and transfer training, requiring mod-maxA+2 with difficulty achieving fully upright standing on LLE (pt with chronic L knee issues). Pt hopeful for d/c to SNF near his home for post-acute rehab, which he will benefit from to maximize functional mobility and independence prior to return home.   Recommendations for follow up therapy are one component of a multi-disciplinary discharge planning process, led by the attending physician.  Recommendations may be updated based on patient status, additional functional criteria and insurance authorization.  Follow Up Recommendations SNF;Supervision for mobility/OOB    Equipment Recommendations   (defer)    Recommendations for Other Services       Precautions / Restrictions Precautions Precautions: Fall;Other (comment) Precaution Comments: R BKA wound vac Required Braces or Orthoses: Other Brace Other Brace: R BKA limb guard Restrictions Weight Bearing Restrictions: Yes RLE Weight Bearing: Non weight bearing      Mobility  Bed Mobility Overal bed mobility: Needs Assistance Bed Mobility: Supine to Sit     Supine to sit: Supervision;HOB elevated     General bed mobility comments: Increased time and effort, reliant on use of bed rail,  supervision for safety/lines    Transfers Overall transfer level: Needs assistance Equipment used: Rolling walker (2 wheeled);2 person hand held assist Transfers: Sit to/from [employer] Sit to Stand: Mod assist;+2 physical assistance   Squat pivot transfers: Max assist;+2 physical assistance     General transfer comment: ModA+2 for trunk elevation and stability standing from slightly raised EOB to RW, cues for sequencing and use of momentum to power up; pt requiring return to sit with c/o feeling weak and fatigue, maxA+2 for squat pivot to recliner towards L-side, pt with c/o lightheadedness  Ambulation/Gait                Stairs            Wheelchair Mobility    Modified Rankin (Stroke Patients Only)       Balance Overall balance assessment: Needs assistance   Sitting balance-Leahy Scale: Fair       Standing balance-Leahy Scale: Zero                               Pertinent Vitals/Pain Pain Assessment: Faces Faces Pain Scale: Hurts a little bit Pain Location: R residual limb Pain Descriptors / Indicators: Discomfort Pain Intervention(s): Monitored during session;Repositioned    Home Living Family/patient expects to be discharged to:: Private residence Living Arrangements: Spouse/significant other Available Help at Discharge: Family;Available 24 hours/day Type of Home: Mobile home Home Access: Stairs to enter       Home Equipment: Walker - 2 wheels;Cane - single point;Other (comment) (lift chair)      Prior Function Level of Independence: Needs assistance   Gait / Transfers Assistance Needed: Mod indep limited household and community  ambulator with SPC. Pt typically sits in lift chair; h/o chronic L knee pain (wheres brace to walk sometimes), therefore used to RLE being dominant. Drives  ADL's / Homemaking Assistance Needed: Wife assists with dressing (lower body) and bathing (reaching backside) as needed; wife does  majority of household tasks; pt takes basin baths sitting on EOB  Comments: Wife does not drive but working to get license renewed     Higher education careers adviser        Extremity/Trunk Assessment   Upper Extremity Assessment Upper Extremity Assessment: Generalized weakness    Lower Extremity Assessment Lower Extremity Assessment: Generalized weakness;RLE deficits/detail;LLE deficits/detail RLE Deficits / Details: s/p R transtibial amputation LLE Deficits / Details: H/o chronic L knee issues, strength at least 3/5 throughout       Communication   Communication: No difficulties  Cognition Arousal/Alertness: Awake/alert Behavior During Therapy: WFL for tasks assessed/performed Overall Cognitive Status: Within Functional Limits for tasks assessed                                        General Comments General comments (skin integrity, edema, etc.): Noted pallor with upright activity and pt c/o feeling lightheaded, post-mobility BP 124/51 (MAP 72). SpO2 89-92% on RA    Exercises     Assessment/Plan    PT Assessment Patient needs continued PT services  PT Problem List Decreased strength;Decreased range of motion;Decreased activity tolerance;Decreased balance;Decreased mobility;Decreased knowledge of use of DME;Decreased knowledge of precautions       PT Treatment Interventions DME instruction;Gait training;Functional mobility training;Therapeutic activities;Therapeutic exercise;Balance training;Wheelchair mobility training;Patient/family education    PT Goals (Current goals can be found in the Care Plan section)  Acute Rehab PT Goals Patient Stated Goal: Post-acute rehab at SNF near his home in [place] PT Goal Formulation: With patient Time For Goal Achievement: 08/18/21 Potential to Achieve Goals: Good    Frequency Min 3X/week   Barriers to discharge        Co-evaluation               AM-PAC PT "6 Clicks" Mobility  Outcome Measure Help needed turning  from your back to your side while in a flat bed without using bedrails?: A Little Help needed moving from lying on your back to sitting on the side of a flat bed without using bedrails?: A Little Help needed moving to and from a bed to a chair (including a wheelchair)?: Total Help needed standing up from a chair using your arms (e.g., wheelchair or bedside chair)?: Total Help needed to walk in hospital room?: Total Help needed climbing 3-5 steps with a railing? : Total 6 Click Score: 10    End of Session Equipment Utilized During Treatment: Gait belt Activity Tolerance: Patient tolerated treatment well Patient left: in chair;with call bell/phone within reach;with chair alarm set Nurse Communication: Mobility status PT Visit Diagnosis: Other abnormalities of gait and mobility (R26.89);Muscle weakness (generalized) (M62.81)    Time: 5784-6962 PT Time Calculation (min) (ACUTE ONLY): 29 min   Charges:   PT Evaluation $PT Eval Moderate Complexity: 1 Mod     Ina Homes, PT, DPT Acute Rehabilitation Services  Pager [phone] Office [phone]  Malachy Chamber 08/04/2021, 12:20 PM

## 2021-08-04 NOTE — Anesthesia Postprocedure Evaluation (Signed)
Anesthesia Post Note  Patient: Nathan Miller  Procedure(s) Performed: AMPUTATION BELOW KNEE (Right: Knee)     Patient location during evaluation: PACU Anesthesia Type: Regional and General Level of consciousness: awake and alert Pain management: pain level controlled Vital Signs Assessment: post-procedure vital signs reviewed and stable Respiratory status: spontaneous breathing, nonlabored ventilation, respiratory function stable and patient connected to nasal cannula oxygen Cardiovascular status: blood pressure returned to baseline and stable Postop Assessment: no apparent nausea or vomiting Anesthetic complications: no   No notable events documented.  Last Vitals:  Vitals:   08/04/21 0005 08/04/21 0445  BP: 125/62 121/63  Pulse: (!) 49 (!) 51  Resp: 16 17  Temp: 36.4 C 36.4 C  SpO2: 93% 92%    Last Pain:  Vitals:   08/04/21 0843  TempSrc:   PainSc: 0-No pain                 Yeiden Frenkel L Benjamin Casanas

## 2021-08-04 NOTE — Progress Notes (Signed)
PROGRESS NOTE  Nathan Miller Nathan Miller DOB: 03-Jan-1951 DOA: 08/01/2021 PCP: [facility]  HPI/Recap of past 24 hours:  Nathan Miller is a 70 y.o. male with a history of CAD, diabetes mellitus, type 2, chronic lymphedema, complete heart block s/p PPM, chronic foot wound followed by wound care. Patient presented secondary to purulent drainage from right foot and found to have acute infection with initial concern for possibly necrotizing infection. CT imaging not completely suggestive, but patient received IV antibiotics empirically, IV vancomycin, Zosyn, IV clindamycin.  Seen by orthopedic surgery, Dr. Lajoyce Corners.  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  08/04/2021: Patient was seen and examined at his bedside.  He had no new complaint at this time.  Seen by PT OT with recommendation for SNF.  TOC consulted and assisting with DC planning.  Assessment/Plan: Principal Problem:   Diabetic foot infection (HCC) Active Problems:   Subacute osteomyelitis, right ankle and foot (HCC)   Essential hypertension   Complete heart block (HCC)   Right foot ulcer (HCC)   Diabetes type 2, controlled (HCC)   Obesity with body mass index (BMI) of 30.0 to 39.9  Right diabetic foot wound/abscess osteomyelitis ulceration right foot status post right transtibial amputation on 08/03/2021. He received empiric IV vancomycin/Zosyn/clindamycin. CTA abdomen obtained by vascular surgery significant for right superficial femoral stenosis of high degree measuring 60% in addition to multifocal high-grade stenosis of left superficial femoral artery. Right transtibial amputation on 08/03/2021. On IV clindamycin, zosyn, and IV vancomycin perioperatively  Acute blood loss anemia postsurgery in the setting of iron deficiency anemia Baseline hemoglobin 12 Hemoglobin downtrending, 7.3 K. Obtain FOBT Transfuse 1 unit PRBC Significant iron deficiency on iron studies done on 08/03/2021 He  is on iron supplement every other day, resume. Give 1 dose of Feraheme on 08/05/2021. Monitor H&H   Peripheral artery disease CTA abdomen obtained by vascular surgery significant for right superficial femoral stenosis of high degree measuring 60% in addition to multifocal high-grade stenosis of left superficial femoral artery. Continue aspirin 81 mg daily, Lipitor 80 mg daily   Prediabetes Hemoglobin A1C of 5.5%. Patient is on metformin as an outpatient which was held on admission. -Continue SSI -Carb modified diet   Essential hypertension On Coreg PTA Continue to monitor vital signs   CAD Denies any anginal symptoms. -Continue aspirin and Lipitor   Chronic diastolic CHF Grade III dysfunction with normal EF. Home torsemide is on hold. On gentle IV fluid hydration normal saline at 50 cc/h x 1 day. Closely monitor volume status while on IV fluid. Strict I's and O's and daily weight   Complete heart block S/p PPM   Hyperlipidemia -Continue Lipitor   Tobacco use Tobacco cessation counseling on admission.   COPD -Continue Spiriva and Albuterol   Diverticulosis Noted on CT imaging. No symptoms.   Morbid obesity Body mass index is 38.4 kg/m.   DVT prophylaxis: Lovenox subcu daily. Code Status:   Full code. Family Communication: None at bedside Disposition Plan: SNF     Consultants:  Vascular surgery [facility] (curbside)   Procedures:  Right transtibial amputation.   Antimicrobials: Vancomycin Cefepime Zosyn Clindamycin     Objective: Vitals:   08/04/21 0005 08/04/21 0445 08/04/21 0903 08/04/21 1320  BP: 125/62 121/63 134/67 (!) 118/59  Pulse: (!) 49 (!) 51 (!) 49 (!) 51  Resp: 16 17 18 18   Temp: 97.6 F (36.4 C) 97.6 F (36.4 C) 97.7 F (36.5 C) 97.7 F (  36.5 C)  TempSrc: Oral Oral Oral Oral  SpO2: 93% 92% 97% 90%  Weight:      Height:        Intake/Output Summary (Last 24 hours) at 08/04/2021 1449 Last data filed at  08/04/2021 0334 Gross per 24 hour  Intake 1441.76 ml  Output 0 ml  Net 1441.76 ml   Filed Weights   08/02/21 1313  Weight: 117.9 kg    Exam:  General: 70 y.o. year-old male well-developed well-nourished in no acute distress.  He is alert oriented x3.   Cardiovascular: Regular rate and rhythm no rubs or gallops.   Respiratory: Clear to auscultation with no wheezes or rales.   Abdomen: Soft nontender normal bowel sounds present.   Musculoskeletal: Trace lower extremity edema bilaterally. Skin: Hyperpigmentation from chronic venous stasis. Psychiatry: Mood is appropriate for condition and setting.   Data Reviewed: CBC: Recent Labs  Lab 08/01/21 1023 08/02/21 1002 08/03/21 0234 08/04/21 0231  WBC 11.3* 7.9 6.8 7.8  NEUTROABS 8.3*  --   --   --   HGB 8.2* 8.2* 7.9* 7.3*  HCT 26.7* 27.2* 24.5* 24.0*  MCV 91.4 91.3 89.1 90.6  PLT 254 252 234 231   Basic Metabolic Panel: Recent Labs  Lab 08/01/21 1023 08/02/21 1002 08/03/21 0234 08/04/21 0231  NA 131* 132* 134* 133*  K 3.9 3.7 3.4* 3.6  CL 88* 90* 91* 91*  CO2 35* 32 33* 33*  GLUCOSE 120* 110* 109* 207*  BUN 18 15 12 9   CREATININE 0.99 0.83 1.07 1.07  CALCIUM 8.5* 8.5* 8.4* 8.1*   GFR: Estimated Creatinine Clearance: 84.9 mL/min (by C-G formula based on SCr of 1.07 mg/dL). Liver Function Tests: Recent Labs  Lab 08/02/21 1002  AST 20  ALT 14  ALKPHOS 82  BILITOT 1.4*  PROT 7.2  ALBUMIN 2.9*   No results for input(s): LIPASE, AMYLASE in the last 168 hours. No results for input(s): AMMONIA in the last 168 hours. Coagulation Profile: No results for input(s): INR, PROTIME in the last 168 hours. Cardiac Enzymes: No results for input(s): CKTOTAL, CKMB, CKMBINDEX, TROPONINI in the last 168 hours. BNP (last 3 results) No results for input(s): PROBNP in the last 8760 hours. HbA1C: Recent Labs    08/02/21 1002  HGBA1C 5.5   CBG: Recent Labs  Lab 08/03/21 1512 08/03/21 1647 08/03/21 2115 08/04/21 0642  08/04/21 1113  GLUCAP 122* 171* 288* 147* 145*   Lipid Profile: No results for input(s): CHOL, HDL, LDLCALC, TRIG, CHOLHDL, LDLDIRECT in the last 72 hours. Thyroid Function Tests: No results for input(s): TSH, T4TOTAL, FREET4, T3FREE, THYROIDAB in the last 72 hours. Anemia Panel: Recent Labs    08/03/21 0234  FERRITIN 61  TIBC 258  IRON 23*   Urine analysis:    Component Value Date/Time   COLORURINE AMBER (A) 04/04/2021 1338   APPEARANCEUR CLOUDY (A) 04/04/2021 1338   LABSPEC 1.021 04/04/2021 1338   PHURINE 5.0 04/04/2021 1338   GLUCOSEU NEGATIVE 04/04/2021 1338   HGBUR NEGATIVE 04/04/2021 1338   BILIRUBINUR NEGATIVE 04/04/2021 1338   KETONESUR NEGATIVE 04/04/2021 1338   PROTEINUR 30 (A) 04/04/2021 1338   UROBILINOGEN 1.0 06/13/2015 2104   NITRITE NEGATIVE 04/04/2021 1338   LEUKOCYTESUR NEGATIVE 04/04/2021 1338   Sepsis Labs: @LABRCNTIP (procalcitonin:4,lacticidven:4)  ) Recent Results (from the past 240 hour(s))  Aerobic/Anaerobic Culture w Gram Stain (surgical/deep wound)     Status: None (Preliminary result)   Collection Time: 08/01/21  8:00 AM   Specimen: Foot  Result Value  Ref Range Status   Specimen Description   Final    FOOT RIGHT PLANTAR Performed at [facility], [address], [place], [place] [address]    Special Requests   Final    NONE Performed at [facility], [address], [place], [place] [address]    Gram Stain   Final    NO ORGANISMS SEEN GRAM POSITIVE COCCI MODERATE WBC PRESENT,BOTH PMN AND MONONUCLEAR FEW GRAM POSITIVE COCCI RARE GRAM POSITIVE RODS Performed at [facility], [address] [place], [place] [address]    Culture   Final    FEW STAPHYLOCOCCUS AUREUS FEW ENTEROBACTER CLOACAE CULTURE REINCUBATED FOR BETTER GROWTH NO ANAEROBES ISOLATED; CULTURE IN PROGRESS FOR 5 DAYS    Report Status PENDING  Incomplete   Organism ID, Bacteria ENTEROBACTER CLOACAE  Final   Organism ID,  Bacteria STAPHYLOCOCCUS AUREUS  Final      Susceptibility   Enterobacter cloacae - MIC*    CEFAZOLIN >=64 RESISTANT Resistant     CEFEPIME <=0.12 SENSITIVE Sensitive     CEFTAZIDIME <=1 SENSITIVE Sensitive     CIPROFLOXACIN <=0.25 SENSITIVE Sensitive     GENTAMICIN <=1 SENSITIVE Sensitive     IMIPENEM <=0.25 SENSITIVE Sensitive     TRIMETH/SULFA >=320 RESISTANT Resistant     PIP/TAZO <=4 SENSITIVE Sensitive     * FEW ENTEROBACTER CLOACAE   Staphylococcus aureus - MIC*    CIPROFLOXACIN <=0.5 SENSITIVE Sensitive     ERYTHROMYCIN <=0.25 SENSITIVE Sensitive     GENTAMICIN <=0.5 SENSITIVE Sensitive     OXACILLIN <=0.25 SENSITIVE Sensitive     TETRACYCLINE <=1 SENSITIVE Sensitive     VANCOMYCIN <=0.5 SENSITIVE Sensitive     TRIMETH/SULFA <=10 SENSITIVE Sensitive     CLINDAMYCIN <=0.25 SENSITIVE Sensitive     RIFAMPIN <=0.5 SENSITIVE Sensitive     Inducible Clindamycin NEGATIVE Sensitive     * FEW STAPHYLOCOCCUS AUREUS  Culture, blood (routine x 2)     Status: None (Preliminary result)   Collection Time: 08/01/21  1:24 PM   Specimen: BLOOD  Result Value Ref Range Status   Specimen Description   Final    BLOOD LEFT ANTECUBITAL Performed at [facility], [address] [place], [place] [address]    Special Requests   Final    BOTTLES DRAWN AEROBIC AND ANAEROBIC Blood Culture adequate volume Performed at [facility], [address] [place], [place] [address]    Culture   Final    NO GROWTH 3 DAYS Performed at [facility], [address] [place], [place] [address]    Report Status PENDING  Incomplete  Culture, blood (routine x 2)     Status: None (Preliminary result)   Collection Time: 08/01/21  1:24 PM   Specimen: BLOOD  Result Value Ref Range Status   Specimen Description   Final    BLOOD RIGHT ANTECUBITAL Performed at [facility], [address] [place], [place] [address]    Special Requests   Final     BOTTLES DRAWN AEROBIC AND ANAEROBIC Blood Culture results may not be optimal due to an excessive volume of blood received in culture bottles Performed at [facility], [address], [place], [place] [address]    Culture   Final    NO GROWTH 3 DAYS Performed at [facility], [address] [place], [place] [address]    Report Status PENDING  Incomplete  SARS  CORONAVIRUS 2 (TAT 6-24 HRS) Nasopharyngeal Nasopharyngeal Swab     Status: None   Collection Time: 08/01/21  2:54 PM   Specimen: Nasopharyngeal Swab  Result Value Ref Range Status   SARS Coronavirus 2 NEGATIVE NEGATIVE Final    Comment: (NOTE) SARS-CoV-2 target nucleic acids are NOT DETECTED.  The SARS-CoV-2 RNA is generally detectable in upper and lower respiratory specimens during the acute phase of infection. Negative results do not preclude SARS-CoV-2 infection, do not rule out co-infections with other pathogens, and should not be used as the sole basis for treatment or other patient management decisions. Negative results must be combined with clinical observations, patient history, and epidemiological information. The expected result is Negative.  Fact Sheet for Patients: HairSlick.no  Fact Sheet for Healthcare Providers: quierodirigir.com  This test is not yet approved or cleared by the [place] FDA and  has been authorized for detection and/or diagnosis of SARS-CoV-2 by FDA under an Emergency Use Authorization (EUA). This EUA will remain  in effect (meaning this test can be used) for the duration of the COVID-19 declaration under Se ction 564(b)(1) of the Act, 21 U.S.C. section 360bbb-3(b)(1), unless the authorization is terminated or revoked sooner.  Performed at [facility], [address], [place], [place] [address]   MRSA Next Gen by PCR, Nasal     Status: None   Collection Time: 08/03/21 10:35 AM   Specimen: Nasal  Mucosa; Nasal Swab  Result Value Ref Range Status   MRSA by PCR Next Gen NOT DETECTED NOT DETECTED Final    Comment: (NOTE) The GeneXpert MRSA Assay (FDA approved for NASAL specimens only), is one component of a comprehensive MRSA colonization surveillance program. It is not intended to diagnose MRSA infection nor to guide or monitor treatment for MRSA infections. Test performance is not FDA approved in patients less than 8 years old. Performed at [facility], [address], [place], [place] [address]       Studies: No results found.  Scheduled Meds:  vitamin C  1,000 mg Oral Daily   aspirin EC  81 mg Oral Daily   atorvastatin  80 mg Oral Daily   carvedilol  25 mg Oral BID   chlorhexidine  60 mL Topical Once   Chlorhexidine Gluconate Cloth  6 each Topical Daily   docusate sodium  100 mg Oral Daily   enoxaparin (LOVENOX) injection  40 mg Subcutaneous Q24H   fentaNYL (SUBLIMAZE) injection  50 mcg Intravenous Once   insulin aspart  0-15 Units Subcutaneous TID WC   nutrition supplement (JUVEN)  1 packet Oral BID BM   pantoprazole  40 mg Oral Daily   potassium chloride  40 mEq Oral Once   povidone-iodine  2 application Topical Once   torsemide  20 mg Oral Daily   umeclidinium bromide  1 puff Inhalation Daily   zinc sulfate  220 mg Oral Daily    Continuous Infusions:  sodium chloride 100 mL/hr at 08/03/21 0623   sodium chloride 75 mL/hr at 08/04/21 0334    ceFAZolin (ANCEF) IV     clindamycin (CLEOCIN) IV 900 mg (08/04/21 1313)   magnesium sulfate bolus IVPB     piperacillin-tazobactam (ZOSYN)  IV 3.375 g (08/04/21 0548)   tranexamic acid     vancomycin 1,000 mg (08/04/21 1408)     LOS: 3 days     Darlin Drop, MD [facility] Pager [phone]  If 7PM-7AM, please contact night-coverage [email or web address] Password [facility] 08/04/2021, 2:49  PM

## 2021-08-04 NOTE — Progress Notes (Signed)
I think is better in terms of surface area 11/2; wound on the right lateral calcaneus in the setting of chronic Charcot foot  and lymphedema from previous foot surgery. His wound is on the lateral part of his left heel 12/7; right lateral calcaneus the wound is closed. There is still eschar here largely result of offloading difficulties. Still swelling in the foot as result of Charcot deformity previous osteomyelitis 08/04/2021 Patient Is well-known to the clinic and presents for evaluation of worsening right foot wound. This is a reoccurring wound for the patient. He states the wound reopened about 2 months ago. He has been using Dakin's wet to dry gauze dressings for wound care. He has a history of Charcot foot and chronic osteomyelitis. He states that several days ago he had increased warmth, redness and purulent drainage from the wound. Electronic Signature(s) Signed: 08/04/2021 1:23:11 PM By: Kalman Shan DO Entered By: Kalman Shan on 08/04/2021 10:11:27 -------------------------------------------------------------------------------- Physical Exam Details Patient Name: Date of Service: Nathan Moll, PA UL D. 08/01/2021 8:15 A M Medical Record Number: [id number] Patient Account Number: [id number] Date of Birth/Sex: Treating RN: 08/25/1951 (70 y.o. Male) Lorrin Jackson Primary Care Provider: York Pellant [place] Other Clinician: Referring Provider: Treating Provider/Extender: Yaakov Guthrie in Treatment: 0 Constitutional respirations regular, non-labored and within target range for patient.[name] Psychiatric pleasant and cooperative. Notes Right foot: Charcot foot with open wound and copious amounts of purulent drainage on palpation. Foot is warm and erythematous. Electronic Signature(s) Signed: 08/04/2021 1:23:11 PM By: Kalman Shan DO Entered By: Kalman Shan on 08/04/2021 12:47:47 -------------------------------------------------------------------------------- Physician Orders Details Patient Name: Date of Service: Nathan Moll, PA UL D. 08/01/2021 8:15 A M Medical Record Number:  [id number] Patient Account Number: [id number] Date of Birth/Sex: Treating RN: [birth year] (70 y.o. Male) Levan Hurst Primary Care Provider: York Pellant [place] Other Clinician: Referring Provider: Treating Provider/Extender: Yaakov Guthrie in Treatment: 0 Verbal / Phone Orders: No Diagnosis Coding ICD-10 Coding Code Description E11.621 Type 2 diabetes mellitus with foot ulcer E11.42 Type 2 diabetes mellitus with diabetic polyneuropathy M14.671 Charcot's joint, right ankle and foot 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 of other part of right foot with fat layer exposed M86.671 Other chronic osteomyelitis, right ankle and foot Follow-up Appointments Other: - GO TO ER ASAP FOR INFECTION IN RIGHT FOOT!! Patient evaluated today in clinic, large amount of purulent drainage from right foot wound, significantly worse over the past few days, with increased swelling and redness in foot and leg. Hx of chronic Osteomyelitis, please evaluate for possible IV antibiotics. Bathing/ Shower/ Hygiene May shower with protection but do not get wound dressing(s) wet. Edema Control - Lymphedema / SCD / Other Elevate legs to the level of the heart or above for 30 minutes daily and/or when sitting, a frequency of: - throughout the day Home Health No change in wound care orders this week; continue Home Health for wound care. May utilize formulary equivalent dressing for wound treatment orders unless otherwise specified. - Patient sent to ER for evaluation Other Home Health Orders/Instructions: - [facility] naerobe culture (MICRO) - right plantar foot - (ICD10 E11.621 - Type 2 diabetes mellitus with Bacteria identified in Unspecified specimen by A foot ulcer) LOINC Code: 916-3 Convenience Name: Anerobic culture Electronic Signature(s) Signed: 08/04/2021 1:23:11 PM By: Kalman Shan DO Previous Signature: 08/02/2021 5:33:33 PM Version By: Levan Hurst RN, BSN Entered By: Kalman Shan on 08/04/2021 12:48:12 -------------------------------------------------------------------------------- Problem List Details Patient Name: Date of Service: Nathan Moll, PA UL D. 08/01/2021 8:15  Subcutaneous Tissue) exposed. Tunneling has been noted at 2:00 with a maximum distance of 10.6cm. Undermining begins at 12:00 and ends at 12:00 with a maximum distance of 4.7cm. There is a large amount of purulent drainage noted. The wound margin is well defined and not attached to the wound base. There is large (67-100%) red, pink granulation within the wound bed. There is a small (1-33%) amount of necrotic tissue within the wound bed including Adherent Slough. 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 This has been an ongoing issue for almost a decade. Wound has recently reopened a couple months ago. On exam there is purulent drainage with increased warmth and erythema present. He has a history of chronic osteomyelitis and Charcot foot. At this time I recommended ED evaluation for likely admission with IV antibiotics and probable amputation. Patient expressed understanding. 46 minutes was spent on the encounter including face-to-face, EMR review and coordination of care Plan Follow-up Appointments: Other: - GO TO ER ASAP FOR INFECTION IN RIGHT FOOT!! Patient evaluated today in clinic, large amount of purulent drainage from right foot  wound, significantly worse over the past few days, with increased swelling and redness in foot and leg. Hx of chronic Osteomyelitis, please evaluate for possible IV antibiotics. Bathing/ Shower/ Hygiene: May shower with protection but do not get wound dressing(s) wet. Edema Control - Lymphedema / SCD / Other: Elevate legs to the level of the heart or above for 30 minutes daily and/or when sitting, a frequency of: - throughout the day Home Health: No change in wound care orders this week; continue Home Health for wound care. May utilize formulary equivalent dressing for wound treatment orders unless otherwise specified. - Patient sent to ER for evaluation Other Home Health Orders/Instructions: - [facility] ordered were: Anerobic culture - right plantar foot 1. Go to the ED for evaluation for likely admission, IV antibiotics and amputation Electronic Signature(s) Signed: 08/04/2021 1:23:11 PM By: Kalman Shan DO Entered By: Kalman Shan on 08/04/2021 12:52:43 -------------------------------------------------------------------------------- HxROS Details Patient Name: Date of Service: Nathan Moll, PA UL D. 08/01/2021 8:15 A M Medical Record Number: [id number] Patient Account Number: [id number] Date of Birth/Sex: Treating RN: [birth year] (70 y.o. Male) Levan Hurst Primary Care Provider: York Pellant [place] Other Clinician: Referring Provider: Treating Provider/Extender: Yaakov Guthrie in Treatment: 0 Information Obtained From Patient Constitutional Symptoms (General Health) Complaints and Symptoms: Negative for: Fatigue; Fever; Chills; Marked Weight Change Medical History: Past Medical History Notes: obesity Eyes Complaints and Symptoms: Positive for: Glasses / Contacts - glasses Ear/Nose/Mouth/Throat Complaints and Symptoms: Negative for: Chronic sinus problems or rhinitis Respiratory Complaints and Symptoms: Negative for: Chronic or frequent coughs;  Shortness of Breath Gastrointestinal Complaints and Symptoms: Negative for: Frequent diarrhea; Nausea; Vomiting Medical History: Past Medical History Notes: diverticulosis, umbilical hernia Genitourinary Complaints and Symptoms: Negative for: Frequent urination Medical History: Negative for: End Stage Renal Disease Integumentary (Skin) Complaints and Symptoms: Positive for: Wounds - wound on right foot Medical History: Negative for: History of Burn Hematologic/Lymphatic Cardiovascular Medical History: Positive for: Congestive Heart Failure; Coronary Artery Disease; Hypertension Past Medical History Notes: complete heart block, pacemaker Endocrine Medical History: Positive for: Type II Diabetes Negative for: Type I Diabetes Treated with: Oral agents Blood sugar tested  I think is better in terms of surface area 11/2; wound on the right lateral calcaneus in the setting of chronic Charcot foot  and lymphedema from previous foot surgery. His wound is on the lateral part of his left heel 12/7; right lateral calcaneus the wound is closed. There is still eschar here largely result of offloading difficulties. Still swelling in the foot as result of Charcot deformity previous osteomyelitis 08/04/2021 Patient Is well-known to the clinic and presents for evaluation of worsening right foot wound. This is a reoccurring wound for the patient. He states the wound reopened about 2 months ago. He has been using Dakin's wet to dry gauze dressings for wound care. He has a history of Charcot foot and chronic osteomyelitis. He states that several days ago he had increased warmth, redness and purulent drainage from the wound. Electronic Signature(s) Signed: 08/04/2021 1:23:11 PM By: Kalman Shan DO Entered By: Kalman Shan on 08/04/2021 10:11:27 -------------------------------------------------------------------------------- Physical Exam Details Patient Name: Date of Service: Nathan Moll, PA UL D. 08/01/2021 8:15 A M Medical Record Number: [id number] Patient Account Number: [id number] Date of Birth/Sex: Treating RN: 08/25/1951 (70 y.o. Male) Lorrin Jackson Primary Care Provider: York Pellant [place] Other Clinician: Referring Provider: Treating Provider/Extender: Yaakov Guthrie in Treatment: 0 Constitutional respirations regular, non-labored and within target range for patient.[name] Psychiatric pleasant and cooperative. Notes Right foot: Charcot foot with open wound and copious amounts of purulent drainage on palpation. Foot is warm and erythematous. Electronic Signature(s) Signed: 08/04/2021 1:23:11 PM By: Kalman Shan DO Entered By: Kalman Shan on 08/04/2021 12:47:47 -------------------------------------------------------------------------------- Physician Orders Details Patient Name: Date of Service: Nathan Moll, PA UL D. 08/01/2021 8:15 A M Medical Record Number:  [id number] Patient Account Number: [id number] Date of Birth/Sex: Treating RN: [birth year] (70 y.o. Male) Levan Hurst Primary Care Provider: York Pellant [place] Other Clinician: Referring Provider: Treating Provider/Extender: Yaakov Guthrie in Treatment: 0 Verbal / Phone Orders: No Diagnosis Coding ICD-10 Coding Code Description E11.621 Type 2 diabetes mellitus with foot ulcer E11.42 Type 2 diabetes mellitus with diabetic polyneuropathy M14.671 Charcot's joint, right ankle and foot 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 of other part of right foot with fat layer exposed M86.671 Other chronic osteomyelitis, right ankle and foot Follow-up Appointments Other: - GO TO ER ASAP FOR INFECTION IN RIGHT FOOT!! Patient evaluated today in clinic, large amount of purulent drainage from right foot wound, significantly worse over the past few days, with increased swelling and redness in foot and leg. Hx of chronic Osteomyelitis, please evaluate for possible IV antibiotics. Bathing/ Shower/ Hygiene May shower with protection but do not get wound dressing(s) wet. Edema Control - Lymphedema / SCD / Other Elevate legs to the level of the heart or above for 30 minutes daily and/or when sitting, a frequency of: - throughout the day Home Health No change in wound care orders this week; continue Home Health for wound care. May utilize formulary equivalent dressing for wound treatment orders unless otherwise specified. - Patient sent to ER for evaluation Other Home Health Orders/Instructions: - [facility] naerobe culture (MICRO) - right plantar foot - (ICD10 E11.621 - Type 2 diabetes mellitus with Bacteria identified in Unspecified specimen by A foot ulcer) LOINC Code: 916-3 Convenience Name: Anerobic culture Electronic Signature(s) Signed: 08/04/2021 1:23:11 PM By: Kalman Shan DO Previous Signature: 08/02/2021 5:33:33 PM Version By: Levan Hurst RN, BSN Entered By: Kalman Shan on 08/04/2021 12:48:12 -------------------------------------------------------------------------------- Problem List Details Patient Name: Date of Service: Nathan Moll, PA UL D. 08/01/2021 8:15  Subcutaneous Tissue) exposed. Tunneling has been noted at 2:00 with a maximum distance of 10.6cm. Undermining begins at 12:00 and ends at 12:00 with a maximum distance of 4.7cm. There is a large amount of purulent drainage noted. The wound margin is well defined and not attached to the wound base. There is large (67-100%) red, pink granulation within the wound bed. There is a small (1-33%) amount of necrotic tissue within the wound bed including Adherent Slough. 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 This has been an ongoing issue for almost a decade. Wound has recently reopened a couple months ago. On exam there is purulent drainage with increased warmth and erythema present. He has a history of chronic osteomyelitis and Charcot foot. At this time I recommended ED evaluation for likely admission with IV antibiotics and probable amputation. Patient expressed understanding. 46 minutes was spent on the encounter including face-to-face, EMR review and coordination of care Plan Follow-up Appointments: Other: - GO TO ER ASAP FOR INFECTION IN RIGHT FOOT!! Patient evaluated today in clinic, large amount of purulent drainage from right foot  wound, significantly worse over the past few days, with increased swelling and redness in foot and leg. Hx of chronic Osteomyelitis, please evaluate for possible IV antibiotics. Bathing/ Shower/ Hygiene: May shower with protection but do not get wound dressing(s) wet. Edema Control - Lymphedema / SCD / Other: Elevate legs to the level of the heart or above for 30 minutes daily and/or when sitting, a frequency of: - throughout the day Home Health: No change in wound care orders this week; continue Home Health for wound care. May utilize formulary equivalent dressing for wound treatment orders unless otherwise specified. - Patient sent to ER for evaluation Other Home Health Orders/Instructions: - [facility] ordered were: Anerobic culture - right plantar foot 1. Go to the ED for evaluation for likely admission, IV antibiotics and amputation Electronic Signature(s) Signed: 08/04/2021 1:23:11 PM By: Kalman Shan DO Entered By: Kalman Shan on 08/04/2021 12:52:43 -------------------------------------------------------------------------------- HxROS Details Patient Name: Date of Service: Nathan Moll, PA UL D. 08/01/2021 8:15 A M Medical Record Number: [id number] Patient Account Number: [id number] Date of Birth/Sex: Treating RN: [birth year] (70 y.o. Male) Levan Hurst Primary Care Provider: York Pellant [place] Other Clinician: Referring Provider: Treating Provider/Extender: Yaakov Guthrie in Treatment: 0 Information Obtained From Patient Constitutional Symptoms (General Health) Complaints and Symptoms: Negative for: Fatigue; Fever; Chills; Marked Weight Change Medical History: Past Medical History Notes: obesity Eyes Complaints and Symptoms: Positive for: Glasses / Contacts - glasses Ear/Nose/Mouth/Throat Complaints and Symptoms: Negative for: Chronic sinus problems or rhinitis Respiratory Complaints and Symptoms: Negative for: Chronic or frequent coughs;  Shortness of Breath Gastrointestinal Complaints and Symptoms: Negative for: Frequent diarrhea; Nausea; Vomiting Medical History: Past Medical History Notes: diverticulosis, umbilical hernia Genitourinary Complaints and Symptoms: Negative for: Frequent urination Medical History: Negative for: End Stage Renal Disease Integumentary (Skin) Complaints and Symptoms: Positive for: Wounds - wound on right foot Medical History: Negative for: History of Burn Hematologic/Lymphatic Cardiovascular Medical History: Positive for: Congestive Heart Failure; Coronary Artery Disease; Hypertension Past Medical History Notes: complete heart block, pacemaker Endocrine Medical History: Positive for: Type II Diabetes Negative for: Type I Diabetes Treated with: Oral agents Blood sugar tested  I think is better in terms of surface area 11/2; wound on the right lateral calcaneus in the setting of chronic Charcot foot  and lymphedema from previous foot surgery. His wound is on the lateral part of his left heel 12/7; right lateral calcaneus the wound is closed. There is still eschar here largely result of offloading difficulties. Still swelling in the foot as result of Charcot deformity previous osteomyelitis 08/04/2021 Patient Is well-known to the clinic and presents for evaluation of worsening right foot wound. This is a reoccurring wound for the patient. He states the wound reopened about 2 months ago. He has been using Dakin's wet to dry gauze dressings for wound care. He has a history of Charcot foot and chronic osteomyelitis. He states that several days ago he had increased warmth, redness and purulent drainage from the wound. Electronic Signature(s) Signed: 08/04/2021 1:23:11 PM By: Kalman Shan DO Entered By: Kalman Shan on 08/04/2021 10:11:27 -------------------------------------------------------------------------------- Physical Exam Details Patient Name: Date of Service: Nathan Moll, PA UL D. 08/01/2021 8:15 A M Medical Record Number: [id number] Patient Account Number: [id number] Date of Birth/Sex: Treating RN: 08/25/1951 (70 y.o. Male) Lorrin Jackson Primary Care Provider: York Pellant [place] Other Clinician: Referring Provider: Treating Provider/Extender: Yaakov Guthrie in Treatment: 0 Constitutional respirations regular, non-labored and within target range for patient.[name] Psychiatric pleasant and cooperative. Notes Right foot: Charcot foot with open wound and copious amounts of purulent drainage on palpation. Foot is warm and erythematous. Electronic Signature(s) Signed: 08/04/2021 1:23:11 PM By: Kalman Shan DO Entered By: Kalman Shan on 08/04/2021 12:47:47 -------------------------------------------------------------------------------- Physician Orders Details Patient Name: Date of Service: Nathan Moll, PA UL D. 08/01/2021 8:15 A M Medical Record Number:  [id number] Patient Account Number: [id number] Date of Birth/Sex: Treating RN: [birth year] (70 y.o. Male) Levan Hurst Primary Care Provider: York Pellant [place] Other Clinician: Referring Provider: Treating Provider/Extender: Yaakov Guthrie in Treatment: 0 Verbal / Phone Orders: No Diagnosis Coding ICD-10 Coding Code Description E11.621 Type 2 diabetes mellitus with foot ulcer E11.42 Type 2 diabetes mellitus with diabetic polyneuropathy M14.671 Charcot's joint, right ankle and foot 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 of other part of right foot with fat layer exposed M86.671 Other chronic osteomyelitis, right ankle and foot Follow-up Appointments Other: - GO TO ER ASAP FOR INFECTION IN RIGHT FOOT!! Patient evaluated today in clinic, large amount of purulent drainage from right foot wound, significantly worse over the past few days, with increased swelling and redness in foot and leg. Hx of chronic Osteomyelitis, please evaluate for possible IV antibiotics. Bathing/ Shower/ Hygiene May shower with protection but do not get wound dressing(s) wet. Edema Control - Lymphedema / SCD / Other Elevate legs to the level of the heart or above for 30 minutes daily and/or when sitting, a frequency of: - throughout the day Home Health No change in wound care orders this week; continue Home Health for wound care. May utilize formulary equivalent dressing for wound treatment orders unless otherwise specified. - Patient sent to ER for evaluation Other Home Health Orders/Instructions: - [facility] naerobe culture (MICRO) - right plantar foot - (ICD10 E11.621 - Type 2 diabetes mellitus with Bacteria identified in Unspecified specimen by A foot ulcer) LOINC Code: 916-3 Convenience Name: Anerobic culture Electronic Signature(s) Signed: 08/04/2021 1:23:11 PM By: Kalman Shan DO Previous Signature: 08/02/2021 5:33:33 PM Version By: Levan Hurst RN, BSN Entered By: Kalman Shan on 08/04/2021 12:48:12 -------------------------------------------------------------------------------- Problem List Details Patient Name: Date of Service: Nathan Moll, PA UL D. 08/01/2021 8:15  Subcutaneous Tissue) exposed. Tunneling has been noted at 2:00 with a maximum distance of 10.6cm. Undermining begins at 12:00 and ends at 12:00 with a maximum distance of 4.7cm. There is a large amount of purulent drainage noted. The wound margin is well defined and not attached to the wound base. There is large (67-100%) red, pink granulation within the wound bed. There is a small (1-33%) amount of necrotic tissue within the wound bed including Adherent Slough. 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 This has been an ongoing issue for almost a decade. Wound has recently reopened a couple months ago. On exam there is purulent drainage with increased warmth and erythema present. He has a history of chronic osteomyelitis and Charcot foot. At this time I recommended ED evaluation for likely admission with IV antibiotics and probable amputation. Patient expressed understanding. 46 minutes was spent on the encounter including face-to-face, EMR review and coordination of care Plan Follow-up Appointments: Other: - GO TO ER ASAP FOR INFECTION IN RIGHT FOOT!! Patient evaluated today in clinic, large amount of purulent drainage from right foot  wound, significantly worse over the past few days, with increased swelling and redness in foot and leg. Hx of chronic Osteomyelitis, please evaluate for possible IV antibiotics. Bathing/ Shower/ Hygiene: May shower with protection but do not get wound dressing(s) wet. Edema Control - Lymphedema / SCD / Other: Elevate legs to the level of the heart or above for 30 minutes daily and/or when sitting, a frequency of: - throughout the day Home Health: No change in wound care orders this week; continue Home Health for wound care. May utilize formulary equivalent dressing for wound treatment orders unless otherwise specified. - Patient sent to ER for evaluation Other Home Health Orders/Instructions: - [facility] ordered were: Anerobic culture - right plantar foot 1. Go to the ED for evaluation for likely admission, IV antibiotics and amputation Electronic Signature(s) Signed: 08/04/2021 1:23:11 PM By: Kalman Shan DO Entered By: Kalman Shan on 08/04/2021 12:52:43 -------------------------------------------------------------------------------- HxROS Details Patient Name: Date of Service: Nathan Moll, PA UL D. 08/01/2021 8:15 A M Medical Record Number: [id number] Patient Account Number: [id number] Date of Birth/Sex: Treating RN: [birth year] (70 y.o. Male) Levan Hurst Primary Care Provider: York Pellant [place] Other Clinician: Referring Provider: Treating Provider/Extender: Yaakov Guthrie in Treatment: 0 Information Obtained From Patient Constitutional Symptoms (General Health) Complaints and Symptoms: Negative for: Fatigue; Fever; Chills; Marked Weight Change Medical History: Past Medical History Notes: obesity Eyes Complaints and Symptoms: Positive for: Glasses / Contacts - glasses Ear/Nose/Mouth/Throat Complaints and Symptoms: Negative for: Chronic sinus problems or rhinitis Respiratory Complaints and Symptoms: Negative for: Chronic or frequent coughs;  Shortness of Breath Gastrointestinal Complaints and Symptoms: Negative for: Frequent diarrhea; Nausea; Vomiting Medical History: Past Medical History Notes: diverticulosis, umbilical hernia Genitourinary Complaints and Symptoms: Negative for: Frequent urination Medical History: Negative for: End Stage Renal Disease Integumentary (Skin) Complaints and Symptoms: Positive for: Wounds - wound on right foot Medical History: Negative for: History of Burn Hematologic/Lymphatic Cardiovascular Medical History: Positive for: Congestive Heart Failure; Coronary Artery Disease; Hypertension Past Medical History Notes: complete heart block, pacemaker Endocrine Medical History: Positive for: Type II Diabetes Negative for: Type I Diabetes Treated with: Oral agents Blood sugar tested  I think is better in terms of surface area 11/2; wound on the right lateral calcaneus in the setting of chronic Charcot foot  and lymphedema from previous foot surgery. His wound is on the lateral part of his left heel 12/7; right lateral calcaneus the wound is closed. There is still eschar here largely result of offloading difficulties. Still swelling in the foot as result of Charcot deformity previous osteomyelitis 08/04/2021 Patient Is well-known to the clinic and presents for evaluation of worsening right foot wound. This is a reoccurring wound for the patient. He states the wound reopened about 2 months ago. He has been using Dakin's wet to dry gauze dressings for wound care. He has a history of Charcot foot and chronic osteomyelitis. He states that several days ago he had increased warmth, redness and purulent drainage from the wound. Electronic Signature(s) Signed: 08/04/2021 1:23:11 PM By: Kalman Shan DO Entered By: Kalman Shan on 08/04/2021 10:11:27 -------------------------------------------------------------------------------- Physical Exam Details Patient Name: Date of Service: Nathan Moll, PA UL D. 08/01/2021 8:15 A M Medical Record Number: [id number] Patient Account Number: [id number] Date of Birth/Sex: Treating RN: 08/25/1951 (70 y.o. Male) Lorrin Jackson Primary Care Provider: York Pellant [place] Other Clinician: Referring Provider: Treating Provider/Extender: Yaakov Guthrie in Treatment: 0 Constitutional respirations regular, non-labored and within target range for patient.[name] Psychiatric pleasant and cooperative. Notes Right foot: Charcot foot with open wound and copious amounts of purulent drainage on palpation. Foot is warm and erythematous. Electronic Signature(s) Signed: 08/04/2021 1:23:11 PM By: Kalman Shan DO Entered By: Kalman Shan on 08/04/2021 12:47:47 -------------------------------------------------------------------------------- Physician Orders Details Patient Name: Date of Service: Nathan Moll, PA UL D. 08/01/2021 8:15 A M Medical Record Number:  [id number] Patient Account Number: [id number] Date of Birth/Sex: Treating RN: [birth year] (70 y.o. Male) Levan Hurst Primary Care Provider: York Pellant [place] Other Clinician: Referring Provider: Treating Provider/Extender: Yaakov Guthrie in Treatment: 0 Verbal / Phone Orders: No Diagnosis Coding ICD-10 Coding Code Description E11.621 Type 2 diabetes mellitus with foot ulcer E11.42 Type 2 diabetes mellitus with diabetic polyneuropathy M14.671 Charcot's joint, right ankle and foot 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 of other part of right foot with fat layer exposed M86.671 Other chronic osteomyelitis, right ankle and foot Follow-up Appointments Other: - GO TO ER ASAP FOR INFECTION IN RIGHT FOOT!! Patient evaluated today in clinic, large amount of purulent drainage from right foot wound, significantly worse over the past few days, with increased swelling and redness in foot and leg. Hx of chronic Osteomyelitis, please evaluate for possible IV antibiotics. Bathing/ Shower/ Hygiene May shower with protection but do not get wound dressing(s) wet. Edema Control - Lymphedema / SCD / Other Elevate legs to the level of the heart or above for 30 minutes daily and/or when sitting, a frequency of: - throughout the day Home Health No change in wound care orders this week; continue Home Health for wound care. May utilize formulary equivalent dressing for wound treatment orders unless otherwise specified. - Patient sent to ER for evaluation Other Home Health Orders/Instructions: - [facility] naerobe culture (MICRO) - right plantar foot - (ICD10 E11.621 - Type 2 diabetes mellitus with Bacteria identified in Unspecified specimen by A foot ulcer) LOINC Code: 916-3 Convenience Name: Anerobic culture Electronic Signature(s) Signed: 08/04/2021 1:23:11 PM By: Kalman Shan DO Previous Signature: 08/02/2021 5:33:33 PM Version By: Levan Hurst RN, BSN Entered By: Kalman Shan on 08/04/2021 12:48:12 -------------------------------------------------------------------------------- Problem List Details Patient Name: Date of Service: Nathan Moll, PA UL D. 08/01/2021 8:15  A M Medical Record Number: [id number] Patient Account Number: [id number] Date of Birth/Sex: Treating RN: [birth year] (70 y.o. Male) Lorrin Jackson Primary Care Provider: Sharmaine Base, [place] Other Clinician: Referring Provider: Treating Provider/Extender: Yaakov Guthrie in Treatment: 0 Active Problems ICD-10 Encounter Code Description Active Date MDM Diagnosis 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 of other part of right foot with fat layer exposed 08/01/2021 No Yes E11.621 Type 2 diabetes mellitus with foot ulcer 08/01/2021 No Yes E11.42 Type 2 diabetes mellitus with diabetic polyneuropathy 08/01/2021 No Yes M14.671 Charcot's joint, right ankle and foot 08/01/2021 No Yes M86.671 Other chronic osteomyelitis, right ankle and foot 08/01/2021 No Yes Inactive Problems Resolved Problems Electronic Signature(s) Signed: 08/04/2021 1:23:11 PM By: Kalman Shan DO Entered By: Kalman Shan on 08/04/2021 10:05:41 -------------------------------------------------------------------------------- Progress Note Details Patient Name: Date of Service: Nathan Moll, PA UL D. 08/01/2021 8:15 A M Medical Record Number: [id number] Patient Account Number: [id number] Date of Birth/Sex: Treating RN: [birth year] (70 y.o. Male) Lorrin Jackson Primary Care Provider: York Pellant [place] Other Clinician: Referring Provider: Treating Provider/Extender: Yaakov Guthrie in Treatment: 0 Subjective Chief Complaint Information obtained from Patient this patient has a chronic wound in his right lateral foot with chronic osteomyelitis chronic edema. This is been present roughly 6 years. He had extensive surgery on the right foot roughly 5 years ago to remove I believe 3 metatarsals. He finished IV any  biotic's and HBO. He was offered an amputation but refused 12/29/2019; patient is here for review of the wound on the right lateral plantar heel History of Present Illness (HPI) see chief complaint. 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 This is been present for 5-6 years. He underwent a course of IV any biotics and hyperbaric oxygen roughly 5 years ago. He was offered an amputation by orthopedics and I believe at the time I concurred with this however the patient wishes to up go via wait-and-see approach. He is actually done fairly well over the 5 years is still ambulatory. He uses a silver alginate-based dressing on the foot which he changes himself up. 05/27/15; patient returns for his monthly visit the. The status of his foot and his wound are essentially unchanged this patient has a chronic idiopathic neuropathy. He has underlying osteomyelitis. He had extensive orthopedic surgery roughly 3 or 4 years ago had. He has underlying chronic osteomyelitis.[name] He dresses this with silver alginate. We order the supplies. I continue to see him on a monthly basis for wound debridement. 06/24/15 the patient arrives here today for his monthly wound care check. He has underlying chronic osteomyelitis. The wound was surgically debridement of nonviable circumferential tissue. He is continued with silver alginate based stressing severe this area changed daily by his wife. Post debridement this actually looks as good as I've seen this since quite some time 07/29/15. The patient has been coming here monthly for many years. He has a chronic wound on his right lateral foot at roughly the mid shaft of his fifth metatarsal. Initially he underwent a code course of IV antibiotics hyperbaric oxygen, resection I believe of his fifth and fourth metatarsal for chronic osteomyelitis. 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 He was offered an amputation at some point but refused. He comes here for "palliative" wound care which requires extensive debridement of necrotic subcutaneous tissue to clean up the wound bed. He has been using silver alginate based dressings at home  A M Medical Record Number: [id number] Patient Account Number: [id number] Date of Birth/Sex: Treating RN: [birth year] (70 y.o. Male) Lorrin Jackson Primary Care Provider: Sharmaine Base, [place] Other Clinician: Referring Provider: Treating Provider/Extender: Yaakov Guthrie in Treatment: 0 Active Problems ICD-10 Encounter Code Description Active Date MDM Diagnosis 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 of other part of right foot with fat layer exposed 08/01/2021 No Yes E11.621 Type 2 diabetes mellitus with foot ulcer 08/01/2021 No Yes E11.42 Type 2 diabetes mellitus with diabetic polyneuropathy 08/01/2021 No Yes M14.671 Charcot's joint, right ankle and foot 08/01/2021 No Yes M86.671 Other chronic osteomyelitis, right ankle and foot 08/01/2021 No Yes Inactive Problems Resolved Problems Electronic Signature(s) Signed: 08/04/2021 1:23:11 PM By: Kalman Shan DO Entered By: Kalman Shan on 08/04/2021 10:05:41 -------------------------------------------------------------------------------- Progress Note Details Patient Name: Date of Service: Nathan Moll, PA UL D. 08/01/2021 8:15 A M Medical Record Number: [id number] Patient Account Number: [id number] Date of Birth/Sex: Treating RN: [birth year] (70 y.o. Male) Lorrin Jackson Primary Care Provider: York Pellant [place] Other Clinician: Referring Provider: Treating Provider/Extender: Yaakov Guthrie in Treatment: 0 Subjective Chief Complaint Information obtained from Patient this patient has a chronic wound in his right lateral foot with chronic osteomyelitis chronic edema. This is been present roughly 6 years. He had extensive surgery on the right foot roughly 5 years ago to remove I believe 3 metatarsals. He finished IV any  biotic's and HBO. He was offered an amputation but refused 12/29/2019; patient is here for review of the wound on the right lateral plantar heel History of Present Illness (HPI) see chief complaint. 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 This is been present for 5-6 years. He underwent a course of IV any biotics and hyperbaric oxygen roughly 5 years ago. He was offered an amputation by orthopedics and I believe at the time I concurred with this however the patient wishes to up go via wait-and-see approach. He is actually done fairly well over the 5 years is still ambulatory. He uses a silver alginate-based dressing on the foot which he changes himself up. 05/27/15; patient returns for his monthly visit the. The status of his foot and his wound are essentially unchanged this patient has a chronic idiopathic neuropathy. He has underlying osteomyelitis. He had extensive orthopedic surgery roughly 3 or 4 years ago had. He has underlying chronic osteomyelitis.[name] He dresses this with silver alginate. We order the supplies. I continue to see him on a monthly basis for wound debridement. 06/24/15 the patient arrives here today for his monthly wound care check. He has underlying chronic osteomyelitis. The wound was surgically debridement of nonviable circumferential tissue. He is continued with silver alginate based stressing severe this area changed daily by his wife. Post debridement this actually looks as good as I've seen this since quite some time 07/29/15. The patient has been coming here monthly for many years. He has a chronic wound on his right lateral foot at roughly the mid shaft of his fifth metatarsal. Initially he underwent a code course of IV antibiotics hyperbaric oxygen, resection I believe of his fifth and fourth metatarsal for chronic osteomyelitis. 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 He was offered an amputation at some point but refused. He comes here for "palliative" wound care which requires extensive debridement of necrotic subcutaneous tissue to clean up the wound bed. He has been using silver alginate based dressings at home  MAKANA, ROSTAD ([id number]) Visit Report for 08/01/2021 Chief Complaint Document Details Patient Name: Date of Service: Nathan Miller, [place] D. 08/01/2021 8:15 A M Medical Record Number: [id number] Patient Account Number: [id number] Date of Birth/Sex: Treating RN: 09-18-1951 (70 y.o. Male) Lorrin Jackson Primary Care Provider: York Pellant [place] Other Clinician: Referring Provider: Treating Provider/Extender: Yaakov Guthrie in Treatment: 0 Information Obtained from: Patient Chief Complaint this patient has a chronic wound in his right lateral foot with chronic osteomyelitis chronic edema. This is been present roughly 6 years. He had extensive surgery on the right foot roughly 5 years ago to remove I believe 3 metatarsals. He finished IV any biotic's and HBO. He was offered an amputation but refused 12/29/2019; patient is here for review of the wound on the right lateral plantar heel Electronic Signature(s) Signed: 08/04/2021 1:23:11 PM By: Kalman Shan DO Entered By: Kalman Shan on 08/04/2021 10:05:59 -------------------------------------------------------------------------------- HPI Details Patient Name: Date of Service: Nathan Moll, PA UL D. 08/01/2021 8:15 A M Medical Record Number: [id number] Patient Account Number: [id number] Date of Birth/Sex: Treating RN: 18-Nov-1950 (70 y.o. Male) Lorrin Jackson Primary Care Provider: York Pellant [place] Other Clinician: Referring Provider: Treating Provider/Extender: Yaakov Guthrie in Treatment: 0 History of Present Illness HPI Description: see chief complaint. 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 This is been present for 5-6 years. He underwent a course of IV any biotics and hyperbaric oxygen roughly 5 years ago. He was offered an amputation by orthopedics and I believe at the time I concurred with this however  the patient wishes to up go via wait-and-see approach. He is actually done fairly well over the 5 years is still ambulatory. He uses a silver alginate-based dressing on the foot which he changes himself up. 05/27/15; patient returns for his monthly visit the. The status of his foot and his wound are essentially unchanged this patient has a chronic idiopathic neuropathy. He has underlying osteomyelitis. He had extensive orthopedic surgery roughly 3 or 4 years ago had. He has underlying chronic osteomyelitis.[name] He dresses this with silver alginate. We order the supplies. I continue to see him on a monthly basis for wound debridement. 06/24/15 the patient arrives here today for his monthly wound care check. He has underlying chronic osteomyelitis. The wound was surgically debridement of nonviable circumferential tissue. He is continued with silver alginate based stressing severe this area changed daily by his wife. Post debridement this actually looks as good as I've seen this since quite some time 07/29/15. The patient has been coming here monthly for many years. He has a chronic wound on his right lateral foot at roughly the mid shaft of his fifth metatarsal. Initially he underwent a code course of IV antibiotics hyperbaric oxygen, resection I believe of his fifth and fourth metatarsal for chronic osteomyelitis. 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 He was offered an amputation at some point but refused. He comes here for "palliative" wound care which requires extensive debridement of necrotic subcutaneous tissue to clean up the wound bed. He has been using silver alginate based dressings at home for many years. Although he has extensive edema of his foot, the situation has overall remained stable perhaps surprisingly so. 09/16/15; the patient has been coming here for many years. He is a chronic wound in his right lateral foot roughly the mid shaft of the fifth metatarsal. He is had  A M Medical Record Number: [id number] Patient Account Number: [id number] Date of Birth/Sex: Treating RN: [birth year] (70 y.o. Male) Lorrin Jackson Primary Care Provider: Sharmaine Base, [place] Other Clinician: Referring Provider: Treating Provider/Extender: Yaakov Guthrie in Treatment: 0 Active Problems ICD-10 Encounter Code Description Active Date MDM Diagnosis 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 of other part of right foot with fat layer exposed 08/01/2021 No Yes E11.621 Type 2 diabetes mellitus with foot ulcer 08/01/2021 No Yes E11.42 Type 2 diabetes mellitus with diabetic polyneuropathy 08/01/2021 No Yes M14.671 Charcot's joint, right ankle and foot 08/01/2021 No Yes M86.671 Other chronic osteomyelitis, right ankle and foot 08/01/2021 No Yes Inactive Problems Resolved Problems Electronic Signature(s) Signed: 08/04/2021 1:23:11 PM By: Kalman Shan DO Entered By: Kalman Shan on 08/04/2021 10:05:41 -------------------------------------------------------------------------------- Progress Note Details Patient Name: Date of Service: Nathan Moll, PA UL D. 08/01/2021 8:15 A M Medical Record Number: [id number] Patient Account Number: [id number] Date of Birth/Sex: Treating RN: [birth year] (70 y.o. Male) Lorrin Jackson Primary Care Provider: York Pellant [place] Other Clinician: Referring Provider: Treating Provider/Extender: Yaakov Guthrie in Treatment: 0 Subjective Chief Complaint Information obtained from Patient this patient has a chronic wound in his right lateral foot with chronic osteomyelitis chronic edema. This is been present roughly 6 years. He had extensive surgery on the right foot roughly 5 years ago to remove I believe 3 metatarsals. He finished IV any  biotic's and HBO. He was offered an amputation but refused 12/29/2019; patient is here for review of the wound on the right lateral plantar heel History of Present Illness (HPI) see chief complaint. 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 This is been present for 5-6 years. He underwent a course of IV any biotics and hyperbaric oxygen roughly 5 years ago. He was offered an amputation by orthopedics and I believe at the time I concurred with this however the patient wishes to up go via wait-and-see approach. He is actually done fairly well over the 5 years is still ambulatory. He uses a silver alginate-based dressing on the foot which he changes himself up. 05/27/15; patient returns for his monthly visit the. The status of his foot and his wound are essentially unchanged this patient has a chronic idiopathic neuropathy. He has underlying osteomyelitis. He had extensive orthopedic surgery roughly 3 or 4 years ago had. He has underlying chronic osteomyelitis.[name] He dresses this with silver alginate. We order the supplies. I continue to see him on a monthly basis for wound debridement. 06/24/15 the patient arrives here today for his monthly wound care check. He has underlying chronic osteomyelitis. The wound was surgically debridement of nonviable circumferential tissue. He is continued with silver alginate based stressing severe this area changed daily by his wife. Post debridement this actually looks as good as I've seen this since quite some time 07/29/15. The patient has been coming here monthly for many years. He has a chronic wound on his right lateral foot at roughly the mid shaft of his fifth metatarsal. Initially he underwent a code course of IV antibiotics hyperbaric oxygen, resection I believe of his fifth and fourth metatarsal for chronic osteomyelitis. 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 He was offered an amputation at some point but refused. He comes here for "palliative" wound care which requires extensive debridement of necrotic subcutaneous tissue to clean up the wound bed. He has been using silver alginate based dressings at home  every day: Yes Tested : 1-2x a day Blood sugar testing results: Breakfast: 90-110 Immunological Musculoskeletal Medical History: Positive for: Osteoarthritis; Osteomyelitis - right foot Negative for: Gout; Rheumatoid Arthritis Past Medical History Notes: Charcot foot Neurologic Medical History: Positive for: Neuropathy Oncologic Medical History: Negative for: Received Chemotherapy; Received Radiation Psychiatric Medical History: Positive for: Confinement Anxiety Negative for: Anorexia/bulimia Immunizations Pneumococcal Vaccine: Received Pneumococcal Vaccination: Yes Received Pneumococcal Vaccination On or After 60th Birthday: Yes Implantable Devices Yes Hospitalization / Surgery History Type of Hospitalization/Surgery pacemaker placement IandD right loot Family and Social History Cancer: No; Diabetes: Yes - Father,Paternal Grandparents; Heart Disease: Yes - Father,Paternal Grandparents; Hereditary Spherocytosis: No; Hypertension: Yes - Father,Paternal Grandparents; Kidney Disease: Yes - Father; Lung Disease: No; Seizures: No; Stroke: No; Thyroid Problems: No; Tuberculosis: No; Former smoker -  uses chewing tobacco; Marital Status - Married; Alcohol Use: Never; Drug Use: Prior History - TCH; Caffeine Use: Moderate - soda; Financial Concerns: No; Food, Clothing or Shelter Needs: No; Support System Lacking: No; Transportation Concerns: No Electronic Signature(s) Signed: 08/02/2021 5:33:33 PM By: Levan Hurst RN, BSN Signed: 08/04/2021 1:23:11 PM By: Kalman Shan DO Entered By: Levan Hurst on 08/01/2021 08:26:55 -------------------------------------------------------------------------------- [place] Details Patient Name: Date of Service: Nathan Miller, [place] UL D. 08/01/2021 Medical Record Number: [id number] Patient Account Number: [id number] Date of Birth/Sex: Treating RN: 11-Jun-1951 (70 y.o. Male) Levan Hurst Primary Care Provider: York Pellant [place] Other Clinician: Referring Provider: Treating Provider/Extender: Yaakov Guthrie in Treatment: 0 Diagnosis Coding ICD-10 Codes Code Description E11.621 Type 2 diabetes mellitus with foot ulcer E11.42 Type 2 diabetes mellitus with diabetic polyneuropathy M14.671 Charcot's joint, right ankle and foot 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 of other part of right foot with fat layer exposed M86.671 Other chronic osteomyelitis, right ankle and foot Facility Procedures CPT4 Code: [id number] Description: 99214 - WOUND CARE VISIT-LEV 4 EST PT Modifier: Quantity: 1 Physician Procedures : CPT4 Code Description Modifier [id number] - WC PHYS LEVEL 4 - EST PT ICD-10 Diagnosis Description E11.621 Type 2 diabetes mellitus with foot ulcer E11.42 Type 2 diabetes mellitus with diabetic polyneuropathy M86.671 Other chronic osteomyelitis,  right ankle and foot M14.671 Charcot's joint, right ankle and foot Quantity: 1 Electronic Signature(s) Signed: 08/04/2021 1:23:11 PM By: Kalman Shan DO Previous Signature: 08/02/2021 5:33:33 PM Version By: Levan Hurst RN, BSN Entered By: Kalman Shan on 08/04/2021 12:52:50  Subcutaneous Tissue) exposed. Tunneling has been noted at 2:00 with a maximum distance of 10.6cm. Undermining begins at 12:00 and ends at 12:00 with a maximum distance of 4.7cm. There is a large amount of purulent drainage noted. The wound margin is well defined and not attached to the wound base. There is large (67-100%) red, pink granulation within the wound bed. There is a small (1-33%) amount of necrotic tissue within the wound bed including Adherent Slough. 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 This has been an ongoing issue for almost a decade. Wound has recently reopened a couple months ago. On exam there is purulent drainage with increased warmth and erythema present. He has a history of chronic osteomyelitis and Charcot foot. At this time I recommended ED evaluation for likely admission with IV antibiotics and probable amputation. Patient expressed understanding. 46 minutes was spent on the encounter including face-to-face, EMR review and coordination of care Plan Follow-up Appointments: Other: - GO TO ER ASAP FOR INFECTION IN RIGHT FOOT!! Patient evaluated today in clinic, large amount of purulent drainage from right foot  wound, significantly worse over the past few days, with increased swelling and redness in foot and leg. Hx of chronic Osteomyelitis, please evaluate for possible IV antibiotics. Bathing/ Shower/ Hygiene: May shower with protection but do not get wound dressing(s) wet. Edema Control - Lymphedema / SCD / Other: Elevate legs to the level of the heart or above for 30 minutes daily and/or when sitting, a frequency of: - throughout the day Home Health: No change in wound care orders this week; continue Home Health for wound care. May utilize formulary equivalent dressing for wound treatment orders unless otherwise specified. - Patient sent to ER for evaluation Other Home Health Orders/Instructions: - [facility] ordered were: Anerobic culture - right plantar foot 1. Go to the ED for evaluation for likely admission, IV antibiotics and amputation Electronic Signature(s) Signed: 08/04/2021 1:23:11 PM By: Kalman Shan DO Entered By: Kalman Shan on 08/04/2021 12:52:43 -------------------------------------------------------------------------------- HxROS Details Patient Name: Date of Service: Nathan Moll, PA UL D. 08/01/2021 8:15 A M Medical Record Number: [id number] Patient Account Number: [id number] Date of Birth/Sex: Treating RN: [birth year] (70 y.o. Male) Levan Hurst Primary Care Provider: York Pellant [facility] Other Clinician: Referring Provider: Treating Provider/Extender: Yaakov Guthrie in Treatment: 0 Information Obtained From Patient Constitutional Symptoms (General Health) Complaints and Symptoms: Negative for: Fatigue; Fever; Chills; Marked Weight Change Medical History: Past Medical History Notes: obesity Eyes Complaints and Symptoms: Positive for: Glasses / Contacts - glasses Ear/Nose/Mouth/Throat Complaints and Symptoms: Negative for: Chronic sinus problems or rhinitis Respiratory Complaints and Symptoms: Negative for: Chronic or frequent coughs;  Shortness of Breath Gastrointestinal Complaints and Symptoms: Negative for: Frequent diarrhea; Nausea; Vomiting Medical History: Past Medical History Notes: diverticulosis, umbilical hernia Genitourinary Complaints and Symptoms: Negative for: Frequent urination Medical History: Negative for: End Stage Renal Disease Integumentary (Skin) Complaints and Symptoms: Positive for: Wounds - wound on right foot Medical History: Negative for: History of Burn Hematologic/Lymphatic Cardiovascular Medical History: Positive for: Congestive Heart Failure; Coronary Artery Disease; Hypertension Past Medical History Notes: complete heart block, pacemaker Endocrine Medical History: Positive for: Type II Diabetes Negative for: Type I Diabetes Treated with: Oral agents Blood sugar tested  Subcutaneous Tissue) exposed. Tunneling has been noted at 2:00 with a maximum distance of 10.6cm. Undermining begins at 12:00 and ends at 12:00 with a maximum distance of 4.7cm. There is a large amount of purulent drainage noted. The wound margin is well defined and not attached to the wound base. There is large (67-100%) red, pink granulation within the wound bed. There is a small (1-33%) amount of necrotic tissue within the wound bed including Adherent Slough. 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 This has been an ongoing issue for almost a decade. Wound has recently reopened a couple months ago. On exam there is purulent drainage with increased warmth and erythema present. He has a history of chronic osteomyelitis and Charcot foot. At this time I recommended ED evaluation for likely admission with IV antibiotics and probable amputation. Patient expressed understanding. 46 minutes was spent on the encounter including face-to-face, EMR review and coordination of care Plan Follow-up Appointments: Other: - GO TO ER ASAP FOR INFECTION IN RIGHT FOOT!! Patient evaluated today in clinic, large amount of purulent drainage from right foot  wound, significantly worse over the past few days, with increased swelling and redness in foot and leg. Hx of chronic Osteomyelitis, please evaluate for possible IV antibiotics. Bathing/ Shower/ Hygiene: May shower with protection but do not get wound dressing(s) wet. Edema Control - Lymphedema / SCD / Other: Elevate legs to the level of the heart or above for 30 minutes daily and/or when sitting, a frequency of: - throughout the day Home Health: No change in wound care orders this week; continue Home Health for wound care. May utilize formulary equivalent dressing for wound treatment orders unless otherwise specified. - Patient sent to ER for evaluation Other Home Health Orders/Instructions: - [facility] ordered were: Anerobic culture - right plantar foot 1. Go to the ED for evaluation for likely admission, IV antibiotics and amputation Electronic Signature(s) Signed: 08/04/2021 1:23:11 PM By: Kalman Shan DO Entered By: Kalman Shan on 08/04/2021 12:52:43 -------------------------------------------------------------------------------- HxROS Details Patient Name: Date of Service: Nathan Moll, PA UL D. 08/01/2021 8:15 A M Medical Record Number: [id number] Patient Account Number: [id number] Date of Birth/Sex: Treating RN: [birth year] (70 y.o. Male) Levan Hurst Primary Care Provider: York Pellant [place] Other Clinician: Referring Provider: Treating Provider/Extender: Yaakov Guthrie in Treatment: 0 Information Obtained From Patient Constitutional Symptoms (General Health) Complaints and Symptoms: Negative for: Fatigue; Fever; Chills; Marked Weight Change Medical History: Past Medical History Notes: obesity Eyes Complaints and Symptoms: Positive for: Glasses / Contacts - glasses Ear/Nose/Mouth/Throat Complaints and Symptoms: Negative for: Chronic sinus problems or rhinitis Respiratory Complaints and Symptoms: Negative for: Chronic or frequent coughs;  Shortness of Breath Gastrointestinal Complaints and Symptoms: Negative for: Frequent diarrhea; Nausea; Vomiting Medical History: Past Medical History Notes: diverticulosis, umbilical hernia Genitourinary Complaints and Symptoms: Negative for: Frequent urination Medical History: Negative for: End Stage Renal Disease Integumentary (Skin) Complaints and Symptoms: Positive for: Wounds - wound on right foot Medical History: Negative for: History of Burn Hematologic/Lymphatic Cardiovascular Medical History: Positive for: Congestive Heart Failure; Coronary Artery Disease; Hypertension Past Medical History Notes: complete heart block, pacemaker Endocrine Medical History: Positive for: Type II Diabetes Negative for: Type I Diabetes Treated with: Oral agents Blood sugar tested  Subcutaneous Tissue) exposed. Tunneling has been noted at 2:00 with a maximum distance of 10.6cm. Undermining begins at 12:00 and ends at 12:00 with a maximum distance of 4.7cm. There is a large amount of purulent drainage noted. The wound margin is well defined and not attached to the wound base. There is large (67-100%) red, pink granulation within the wound bed. There is a small (1-33%) amount of necrotic tissue within the wound bed including Adherent Slough. 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 This has been an ongoing issue for almost a decade. Wound has recently reopened a couple months ago. On exam there is purulent drainage with increased warmth and erythema present. He has a history of chronic osteomyelitis and Charcot foot. At this time I recommended ED evaluation for likely admission with IV antibiotics and probable amputation. Patient expressed understanding. 46 minutes was spent on the encounter including face-to-face, EMR review and coordination of care Plan Follow-up Appointments: Other: - GO TO ER ASAP FOR INFECTION IN RIGHT FOOT!! Patient evaluated today in clinic, large amount of purulent drainage from right foot  wound, significantly worse over the past few days, with increased swelling and redness in foot and leg. Hx of chronic Osteomyelitis, please evaluate for possible IV antibiotics. Bathing/ Shower/ Hygiene: May shower with protection but do not get wound dressing(s) wet. Edema Control - Lymphedema / SCD / Other: Elevate legs to the level of the heart or above for 30 minutes daily and/or when sitting, a frequency of: - throughout the day Home Health: No change in wound care orders this week; continue Home Health for wound care. May utilize formulary equivalent dressing for wound treatment orders unless otherwise specified. - Patient sent to ER for evaluation Other Home Health Orders/Instructions: - [facility] ordered were: Anerobic culture - right plantar foot 1. Go to the ED for evaluation for likely admission, IV antibiotics and amputation Electronic Signature(s) Signed: 08/04/2021 1:23:11 PM By: Kalman Shan DO Entered By: Kalman Shan on 08/04/2021 12:52:43 -------------------------------------------------------------------------------- HxROS Details Patient Name: Date of Service: Nathan Moll, PA UL D. 08/01/2021 8:15 A M Medical Record Number: [id number] Patient Account Number: [id number] Date of Birth/Sex: Treating RN: [birth year] (70 y.o. Male) Levan Hurst Primary Care Provider: York Pellant [place] Other Clinician: Referring Provider: Treating Provider/Extender: Yaakov Guthrie in Treatment: 0 Information Obtained From Patient Constitutional Symptoms (General Health) Complaints and Symptoms: Negative for: Fatigue; Fever; Chills; Marked Weight Change Medical History: Past Medical History Notes: obesity Eyes Complaints and Symptoms: Positive for: Glasses / Contacts - glasses Ear/Nose/Mouth/Throat Complaints and Symptoms: Negative for: Chronic sinus problems or rhinitis Respiratory Complaints and Symptoms: Negative for: Chronic or frequent coughs;  Shortness of Breath Gastrointestinal Complaints and Symptoms: Negative for: Frequent diarrhea; Nausea; Vomiting Medical History: Past Medical History Notes: diverticulosis, umbilical hernia Genitourinary Complaints and Symptoms: Negative for: Frequent urination Medical History: Negative for: End Stage Renal Disease Integumentary (Skin) Complaints and Symptoms: Positive for: Wounds - wound on right foot Medical History: Negative for: History of Burn Hematologic/Lymphatic Cardiovascular Medical History: Positive for: Congestive Heart Failure; Coronary Artery Disease; Hypertension Past Medical History Notes: complete heart block, pacemaker Endocrine Medical History: Positive for: Type II Diabetes Negative for: Type I Diabetes Treated with: Oral agents Blood sugar tested  I think is better in terms of surface area 11/2; wound on the right lateral calcaneus in the setting of chronic Charcot foot  and lymphedema from previous foot surgery. His wound is on the lateral part of his left heel 12/7; right lateral calcaneus the wound is closed. There is still eschar here largely result of offloading difficulties. Still swelling in the foot as result of Charcot deformity previous osteomyelitis 08/04/2021 Patient Is well-known to the clinic and presents for evaluation of worsening right foot wound. This is a reoccurring wound for the patient. He states the wound reopened about 2 months ago. He has been using [employer]'s wet to dry gauze dressings for wound care. He has a history of Charcot foot and chronic osteomyelitis. He states that several days ago he had increased warmth, redness and purulent drainage from the wound. Electronic Signature(s) Signed: 08/04/2021 1:23:11 PM By: Kalman Shan DO Entered By: Kalman Shan on 08/04/2021 10:11:27 -------------------------------------------------------------------------------- Physical Exam Details Patient Name: Date of Service: Nathan Moll, PA UL D. 08/01/2021 8:15 A M Medical Record Number: [id number] Patient Account Number: [id number] Date of Birth/Sex: Treating RN: 08/25/1951 (70 y.o. Male) Lorrin Jackson Primary Care Provider: York Pellant [place] Other Clinician: Referring Provider: Treating Provider/Extender: Yaakov Guthrie in Treatment: 0 Constitutional respirations regular, non-labored and within target range for patient.[name] Psychiatric pleasant and cooperative. Notes Right foot: Charcot foot with open wound and copious amounts of purulent drainage on palpation. Foot is warm and erythematous. Electronic Signature(s) Signed: 08/04/2021 1:23:11 PM By: Kalman Shan DO Entered By: Kalman Shan on 08/04/2021 12:47:47 -------------------------------------------------------------------------------- Physician Orders Details Patient Name: Date of Service: Nathan Moll, PA UL D. 08/01/2021 8:15 A M Medical Record Number:  [id number] Patient Account Number: [id number] Date of Birth/Sex: Treating RN: [birth year] (70 y.o. Male) Levan Hurst Primary Care Provider: York Pellant [place] Other Clinician: Referring Provider: Treating Provider/Extender: Yaakov Guthrie in Treatment: 0 Verbal / Phone Orders: No Diagnosis Coding ICD-10 Coding Code Description E11.621 Type 2 diabetes mellitus with foot ulcer E11.42 Type 2 diabetes mellitus with diabetic polyneuropathy M14.671 Charcot's joint, right ankle and foot 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 of other part of right foot with fat layer exposed M86.671 Other chronic osteomyelitis, right ankle and foot Follow-up Appointments Other: - GO TO ER ASAP FOR INFECTION IN RIGHT FOOT!! Patient evaluated today in clinic, large amount of purulent drainage from right foot wound, significantly worse over the past few days, with increased swelling and redness in foot and leg. Hx of chronic Osteomyelitis, please evaluate for possible IV antibiotics. Bathing/ Shower/ Hygiene May shower with protection but do not get wound dressing(s) wet. Edema Control - Lymphedema / SCD / Other Elevate legs to the level of the heart or above for 30 minutes daily and/or when sitting, a frequency of: - throughout the day Home Health No change in wound care orders this week; continue Home Health for wound care. May utilize formulary equivalent dressing for wound treatment orders unless otherwise specified. - Patient sent to ER for evaluation Other Home Health Orders/Instructions: - [facility] naerobe culture (MICRO) - right plantar foot - (ICD10 E11.621 - Type 2 diabetes mellitus with Bacteria identified in Unspecified specimen by A foot ulcer) LOINC Code: 916-3 Convenience Name: Anerobic culture Electronic Signature(s) Signed: 08/04/2021 1:23:11 PM By: Kalman Shan DO Previous Signature: 08/02/2021 5:33:33 PM Version By: Levan Hurst RN, BSN Entered By: Kalman Shan on 08/04/2021 12:48:12 -------------------------------------------------------------------------------- Problem List Details Patient Name: Date of Service: Nathan Moll, PA UL D. 08/01/2021 8:15

## 2021-08-05 LAB — BASIC METABOLIC PANEL
Anion gap: 5 (ref 5–15)
BUN: 7 mg/dL — ABNORMAL LOW (ref 8–23)
CO2: 36 mmol/L — ABNORMAL HIGH (ref 22–32)
Calcium: 8.5 mg/dL — ABNORMAL LOW (ref 8.9–10.3)
Chloride: 94 mmol/L — ABNORMAL LOW (ref 98–111)
Creatinine, Ser: 1 mg/dL (ref 0.61–1.24)
GFR, Estimated: >60 mL/min (ref >60)
Glucose, Bld: 107 mg/dL — ABNORMAL HIGH (ref 70–99)
Potassium: 3.6 mmol/L (ref 3.5–5.1)
Sodium: 135 mmol/L (ref 135–145)

## 2021-08-05 LAB — CBC
HCT: 24.4 % — ABNORMAL LOW (ref 39.0–52.0)
Hemoglobin: 7.5 g/dL — ABNORMAL LOW (ref 13.0–17.0)
MCH: 27.6 pg (ref 26.0–34.0)
MCHC: 30.7 g/dL (ref 30.0–36.0)
MCV: 89.7 fL (ref 80.0–100.0)
Platelets: 250 10*3/uL (ref 150–400)
RBC: 2.72 MIL/uL — ABNORMAL LOW (ref 4.22–5.81)
RDW: 15.6 % — ABNORMAL HIGH (ref 11.5–15.5)
WBC: 10.2 10*3/uL (ref 4.0–10.5)
nRBC: 0 % (ref 0.0–0.2)

## 2021-08-05 LAB — GLUCOSE, CAPILLARY
Glucose-Capillary: 99 mg/dL (ref 70–99)
Glucose-Capillary: 120 mg/dL — ABNORMAL HIGH (ref 70–99)
Glucose-Capillary: 113 mg/dL — ABNORMAL HIGH (ref 70–99)
Glucose-Capillary: 139 mg/dL — ABNORMAL HIGH (ref 70–99)

## 2021-08-05 LAB — HEMOGLOBIN AND HEMATOCRIT, BLOOD
HCT: 24.6 % — ABNORMAL LOW (ref 39.0–52.0)
Hemoglobin: 7.7 g/dL — ABNORMAL LOW (ref 13.0–17.0)

## 2021-08-05 MED ORDER — PANTOPRAZOLE SODIUM 40 MG PO TBEC: 40.0000 mg | DELAYED_RELEASE_TABLET | Freq: Every day | ORAL | 0 refills | Status: DC

## 2021-08-05 MED ORDER — SODIUM CHLORIDE 0.9 % IV SOLN
510.0000 mg | Freq: Once | INTRAVENOUS | Status: AC
  Administered 2021-08-05: 510 mg via INTRAVENOUS
  Filled 2021-08-05: qty 17

## 2021-08-05 MED ORDER — OXYCODONE HCL 5 MG PO TABS: 5.0000 mg | ORAL_TABLET | Freq: Two times a day (BID) | ORAL | 0 refills | Status: AC | PRN

## 2021-08-05 NOTE — Plan of Care (Signed)
  Problem: Education: Goal: Knowledge of General Education information will improve Description Including pain rating scale, medication(s)/side effects and non-pharmacologic comfort measures Outcome: Progressing   Problem: Health Behavior/Discharge Planning: Goal: Ability to manage health-related needs will improve Outcome: Progressing   

## 2021-08-05 NOTE — Discharge Summary (Signed)
Discharge Summary  Nathan Miller Nathan Miller:[id number] DOB: 1951-04-05  PCP: Nathan Miller date: 08/01/2021 Discharge date: 08/05/2021  Time spent: 35 minutes.  Recommendations for Outpatient Follow-up:  Follow-up with orthopedic surgery.  Discharge Diagnoses:  Active Hospital Problems   Diagnosis Date Noted   Diabetic foot infection ([place]) 08/01/2021   Obesity with body mass index (BMI) of 30.0 to 39.9 03/31/2021   Diabetes type 2, controlled (Nathan Miller) 01/29/2016   Complete heart block (Nathan Miller) 01/21/2015   Right foot ulcer (Nathan Miller) 01/21/2015   Essential hypertension 05/10/2011   Subacute osteomyelitis, right ankle and foot Nathan Miller)     Resolved Hospital Problems  No resolved problems to display.    Discharge Condition: Stable  Diet recommendation: Resume previous diet.  Vitals:   08/05/21 0815 08/05/21 1450  BP: (!) 141/69 (!) 157/61  Pulse: (!) 50 (!) 51  Resp: 14 16  Temp: 98 F (36.7 C) 98.4 F (36.9 C)  SpO2: 100% 95%    History of present illness:  Nathan Miller is a 70 y.o. male with a history of CAD, diabetes mellitus, type 2, chronic lymphedema, complete heart block s/p PPM, chronic foot wound followed by wound care. Patient presented secondary to purulent drainage from right foot and found to have acute infection.  He received IV antibiotics empirically, IV vancomycin, Zosyn, IV clindamycin.  Seen by orthopedic surgery, Dr. Sharol Given.  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  Was evaluated by PT OT with recommendation for SNF.  TOC assisting with DC planning.    Hospital course complicated by acute blood loss anemia with iron deficiency.  He was transfused 1 unit PRBC and received infusion of IV Feraheme 510 mg x 1.  FOBT ordered and pending.   08/05/2021: Seen and examined at bedside.  He has no new complaints.  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 He received empiric IV vancomycin/Zosyn/clindamycin. CTA abdomen obtained by vascular surgery significant for right superficial femoral stenosis of high degree measuring 60% in addition to multifocal high-grade stenosis of left superficial femoral artery. Right transtibial amputation on 08/03/2021. On IV clindamycin, zosyn, and IV vancomycin perioperatively   Acute blood loss anemia postsurgery in Nathan setting of iron deficiency anemia Baseline hemoglobin 12 Hemoglobin downtrending, 7.3 K. Obtain FOBT Transfuse 1 unit PRBC Significant iron deficiency on iron studies done on 08/03/2021 He is on iron supplement every other day, resume. Give 1 dose of Feraheme on 08/05/2021. Monitor H&H   Peripheral artery disease CTA abdomen obtained by vascular surgery significant for right superficial femoral stenosis of high degree measuring 60% in addition to multifocal high-grade stenosis of left superficial femoral artery. Continue aspirin 81 mg daily, Lipitor 80 mg daily   Prediabetes Hemoglobin A1C of 5.5%. Patient is on metformin as an outpatient which was held on admission. -Continue SSI -Carb modified diet   Essential hypertension On Coreg PTA Continue to monitor vital signs   CAD Denies any anginal symptoms. -Continue aspirin and Lipitor   Chronic diastolic CHF Grade III dysfunction with normal EF. Home torsemide is on hold. On gentle IV fluid hydration  drainage from RIGHT foot wound, increased swelling and redness; told at [place] to go to ED for RIGHT foot infection, history of chronic osteomyelitis EXAM: RIGHT FOOT COMPLETE - 3+ VIEW COMPARISON:  04/05/2021 FINDINGS: Osseous demineralization. Marked soft tissue swelling. Foci of soft tissue gas are identified at Nathan posterior aspect of Nathan RIGHT ankle  extending into plantar aspect of foot laterally. Findings consistent with either penetrating wound or soft tissue infection by gas-forming organism. Extensive degenerative changes deformity throughout Nathan tarsals to MTP joints. Bone destruction at proximal RIGHT fifth meta tarsal and distal lateral cuboid again seen. Nonunion of old fracture at lateral base of proximal phalanx great toe. No acute fracture or bone destruction definitely visualized. IMPRESSION: Extensive soft tissue swelling with foci of soft tissue gas at Nathan lateral heel and posterior ankle either representing penetrating wound or infection by a gas-forming organism. Extensive degenerative changes and deformities throughout RIGHT foot similar to previous exam. No definite acute fracture or new bone destruction identified. Findings called to [facility] on 08/01/2021 at 1104 hours. Electronically Signed   By: Nathan Miller M.D.   On: 08/01/2021 10:53   VAS [place] ABI WITH/WO TBI  Result Date: 08/01/2021  LOWER EXTREMITY DOPPLER STUDY [name]  Date of Exam:   08/01/2021 Medical Rec #: [id number]     Accession #:    [id number] Date of Birth: 1951-02-11    Patient Gender: M Patient Age:   7 years Exam Location:  [facility] Procedure:      VAS [place] ABI WITH/WO TBI Referring Phys: Nathan Miller --------------------------------------------------------------------------------  Indications: Ulceration. High Risk Factors: Diabetes.  Comparison Study: No prior studies. Performing Technologist: Nathan Miller RVT  Examination Guidelines: A complete evaluation includes at minimum, Doppler waveform signals and systolic blood pressure reading at Nathan level of bilateral brachial, anterior tibial, and posterior tibial arteries, when vessel segments are accessible. Bilateral testing is considered an integral part of a complete examination. Photoelectric Plethysmograph (PPG) waveforms and toe systolic pressure readings are included as required  and additional duplex testing as needed. Limited examinations for reoccurring indications may be performed as noted.  ABI Findings: +---------+------------------+-----+----------+--------+ Right    Rt Pressure (mmHg)IndexWaveform  Comment  +---------+------------------+-----+----------+--------+ Brachial 141                    triphasic          +---------+------------------+-----+----------+--------+ PTA      110               0.75 monophasic         +---------+------------------+-----+----------+--------+ DP       116               0.79 biphasic           +---------+------------------+-----+----------+--------+ Great Toe58                0.40                    +---------+------------------+-----+----------+--------+ +---------+------------------+-----+---------+-------+ Left     Lt Pressure (mmHg)IndexWaveform Comment +---------+------------------+-----+---------+-------+ Brachial 146                    triphasic        +---------+------------------+-----+---------+-------+ PTA      126               0.86 biphasic         +---------+------------------+-----+---------+-------+ DP  Discharge Summary  Nathan Miller Nathan Miller:[id number] DOB: 1951-04-05  PCP: Nathan Miller date: 08/01/2021 Discharge date: 08/05/2021  Time spent: 35 minutes.  Recommendations for Outpatient Follow-up:  Follow-up with orthopedic surgery.  Discharge Diagnoses:  Active Hospital Problems   Diagnosis Date Noted   Diabetic foot infection ([place]) 08/01/2021   Obesity with body mass index (BMI) of 30.0 to 39.9 03/31/2021   Diabetes type 2, controlled (Nathan Miller) 01/29/2016   Complete heart block (Nathan Miller) 01/21/2015   Right foot ulcer (Nathan Miller) 01/21/2015   Essential hypertension 05/10/2011   Subacute osteomyelitis, right ankle and foot Nathan Miller)     Resolved Hospital Problems  No resolved problems to display.    Discharge Condition: Stable  Diet recommendation: Resume previous diet.  Vitals:   08/05/21 0815 08/05/21 1450  BP: (!) 141/69 (!) 157/61  Pulse: (!) 50 (!) 51  Resp: 14 16  Temp: 98 F (36.7 C) 98.4 F (36.9 C)  SpO2: 100% 95%    History of present illness:  Nathan Miller is a 70 y.o. male with a history of CAD, diabetes mellitus, type 2, chronic lymphedema, complete heart block s/p PPM, chronic foot wound followed by wound care. Patient presented secondary to purulent drainage from right foot and found to have acute infection.  He received IV antibiotics empirically, IV vancomycin, Zosyn, IV clindamycin.  Seen by orthopedic surgery, Dr. Sharol Given.  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  Was evaluated by PT OT with recommendation for SNF.  TOC assisting with DC planning.    Hospital course complicated by acute blood loss anemia with iron deficiency.  He was transfused 1 unit PRBC and received infusion of IV Feraheme 510 mg x 1.  FOBT ordered and pending.   08/05/2021: Seen and examined at bedside.  He has no new complaints.  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 He received empiric IV vancomycin/Zosyn/clindamycin. CTA abdomen obtained by vascular surgery significant for right superficial femoral stenosis of high degree measuring 60% in addition to multifocal high-grade stenosis of left superficial femoral artery. Right transtibial amputation on 08/03/2021. On IV clindamycin, zosyn, and IV vancomycin perioperatively   Acute blood loss anemia postsurgery in Nathan setting of iron deficiency anemia Baseline hemoglobin 12 Hemoglobin downtrending, 7.3 K. Obtain FOBT Transfuse 1 unit PRBC Significant iron deficiency on iron studies done on 08/03/2021 He is on iron supplement every other day, resume. Give 1 dose of Feraheme on 08/05/2021. Monitor H&H   Peripheral artery disease CTA abdomen obtained by vascular surgery significant for right superficial femoral stenosis of high degree measuring 60% in addition to multifocal high-grade stenosis of left superficial femoral artery. Continue aspirin 81 mg daily, Lipitor 80 mg daily   Prediabetes Hemoglobin A1C of 5.5%. Patient is on metformin as an outpatient which was held on admission. -Continue SSI -Carb modified diet   Essential hypertension On Coreg PTA Continue to monitor vital signs   CAD Denies any anginal symptoms. -Continue aspirin and Lipitor   Chronic diastolic CHF Grade III dysfunction with normal EF. Home torsemide is on hold. On gentle IV fluid hydration  drainage from RIGHT foot wound, increased swelling and redness; told at [place] to go to ED for RIGHT foot infection, history of chronic osteomyelitis EXAM: RIGHT FOOT COMPLETE - 3+ VIEW COMPARISON:  04/05/2021 FINDINGS: Osseous demineralization. Marked soft tissue swelling. Foci of soft tissue gas are identified at Nathan posterior aspect of Nathan RIGHT ankle  extending into plantar aspect of foot laterally. Findings consistent with either penetrating wound or soft tissue infection by gas-forming organism. Extensive degenerative changes deformity throughout Nathan tarsals to MTP joints. Bone destruction at proximal RIGHT fifth meta tarsal and distal lateral cuboid again seen. Nonunion of old fracture at lateral base of proximal phalanx great toe. No acute fracture or bone destruction definitely visualized. IMPRESSION: Extensive soft tissue swelling with foci of soft tissue gas at Nathan lateral heel and posterior ankle either representing penetrating wound or infection by a gas-forming organism. Extensive degenerative changes and deformities throughout RIGHT foot similar to previous exam. No definite acute fracture or new bone destruction identified. Findings called to [facility] on 08/01/2021 at 1104 hours. Electronically Signed   By: Nathan Miller M.D.   On: 08/01/2021 10:53   VAS [place] ABI WITH/WO TBI  Result Date: 08/01/2021  LOWER EXTREMITY DOPPLER STUDY [name]  Date of Exam:   08/01/2021 Medical Rec #: [id number]     Accession #:    [id number] Date of Birth: 1951-02-11    Patient Gender: M Patient Age:   7 years Exam Location:  [facility] Procedure:      VAS [place] ABI WITH/WO TBI Referring Phys: Nathan Miller --------------------------------------------------------------------------------  Indications: Ulceration. High Risk Factors: Diabetes.  Comparison Study: No prior studies. Performing Technologist: Nathan Miller RVT  Examination Guidelines: A complete evaluation includes at minimum, Doppler waveform signals and systolic blood pressure reading at Nathan level of bilateral brachial, anterior tibial, and posterior tibial arteries, when vessel segments are accessible. Bilateral testing is considered an integral part of a complete examination. Photoelectric Plethysmograph (PPG) waveforms and toe systolic pressure readings are included as required  and additional duplex testing as needed. Limited examinations for reoccurring indications may be performed as noted.  ABI Findings: +---------+------------------+-----+----------+--------+ Right    Rt Pressure (mmHg)IndexWaveform  Comment  +---------+------------------+-----+----------+--------+ Brachial 141                    triphasic          +---------+------------------+-----+----------+--------+ PTA      110               0.75 monophasic         +---------+------------------+-----+----------+--------+ DP       116               0.79 biphasic           +---------+------------------+-----+----------+--------+ Great Toe58                0.40                    +---------+------------------+-----+----------+--------+ +---------+------------------+-----+---------+-------+ Left     Lt Pressure (mmHg)IndexWaveform Comment +---------+------------------+-----+---------+-------+ Brachial 146                    triphasic        +---------+------------------+-----+---------+-------+ PTA      126               0.86 biphasic         +---------+------------------+-----+---------+-------+ DP  drainage from RIGHT foot wound, increased swelling and redness; told at [place] to go to ED for RIGHT foot infection, history of chronic osteomyelitis EXAM: RIGHT FOOT COMPLETE - 3+ VIEW COMPARISON:  04/05/2021 FINDINGS: Osseous demineralization. Marked soft tissue swelling. Foci of soft tissue gas are identified at Nathan posterior aspect of Nathan RIGHT ankle  extending into plantar aspect of foot laterally. Findings consistent with either penetrating wound or soft tissue infection by gas-forming organism. Extensive degenerative changes deformity throughout Nathan tarsals to MTP joints. Bone destruction at proximal RIGHT fifth meta tarsal and distal lateral cuboid again seen. Nonunion of old fracture at lateral base of proximal phalanx great toe. No acute fracture or bone destruction definitely visualized. IMPRESSION: Extensive soft tissue swelling with foci of soft tissue gas at Nathan lateral heel and posterior ankle either representing penetrating wound or infection by a gas-forming organism. Extensive degenerative changes and deformities throughout RIGHT foot similar to previous exam. No definite acute fracture or new bone destruction identified. Findings called to [facility] on 08/01/2021 at 1104 hours. Electronically Signed   By: Nathan Miller M.D.   On: 08/01/2021 10:53   VAS [place] ABI WITH/WO TBI  Result Date: 08/01/2021  LOWER EXTREMITY DOPPLER STUDY [name]  Date of Exam:   08/01/2021 Medical Rec #: [id number]     Accession #:    [id number] Date of Birth: 1951-02-11    Patient Gender: M Patient Age:   7 years Exam Location:  [facility] Procedure:      VAS [place] ABI WITH/WO TBI Referring Phys: Nathan Miller --------------------------------------------------------------------------------  Indications: Ulceration. High Risk Factors: Diabetes.  Comparison Study: No prior studies. Performing Technologist: Nathan Miller RVT  Examination Guidelines: A complete evaluation includes at minimum, Doppler waveform signals and systolic blood pressure reading at Nathan level of bilateral brachial, anterior tibial, and posterior tibial arteries, when vessel segments are accessible. Bilateral testing is considered an integral part of a complete examination. Photoelectric Plethysmograph (PPG) waveforms and toe systolic pressure readings are included as required  and additional duplex testing as needed. Limited examinations for reoccurring indications may be performed as noted.  ABI Findings: +---------+------------------+-----+----------+--------+ Right    Rt Pressure (mmHg)IndexWaveform  Comment  +---------+------------------+-----+----------+--------+ Brachial 141                    triphasic          +---------+------------------+-----+----------+--------+ PTA      110               0.75 monophasic         +---------+------------------+-----+----------+--------+ DP       116               0.79 biphasic           +---------+------------------+-----+----------+--------+ Great Toe58                0.40                    +---------+------------------+-----+----------+--------+ +---------+------------------+-----+---------+-------+ Left     Lt Pressure (mmHg)IndexWaveform Comment +---------+------------------+-----+---------+-------+ Brachial 146                    triphasic        +---------+------------------+-----+---------+-------+ PTA      126               0.86 biphasic         +---------+------------------+-----+---------+-------+ DP  Discharge Summary  Nathan Miller Nathan Miller:[id number] DOB: 1951-04-05  PCP: Nathan Miller date: 08/01/2021 Discharge date: 08/05/2021  Time spent: 35 minutes.  Recommendations for Outpatient Follow-up:  Follow-up with orthopedic surgery.  Discharge Diagnoses:  Active Hospital Problems   Diagnosis Date Noted   Diabetic foot infection ([place]) 08/01/2021   Obesity with body mass index (BMI) of 30.0 to 39.9 03/31/2021   Diabetes type 2, controlled (Nathan Miller) 01/29/2016   Complete heart block (Nathan Miller) 01/21/2015   Right foot ulcer (Nathan Miller) 01/21/2015   Essential hypertension 05/10/2011   Subacute osteomyelitis, right ankle and foot Nathan Miller)     Resolved Hospital Problems  No resolved problems to display.    Discharge Condition: Stable  Diet recommendation: Resume previous diet.  Vitals:   08/05/21 0815 08/05/21 1450  BP: (!) 141/69 (!) 157/61  Pulse: (!) 50 (!) 51  Resp: 14 16  Temp: 98 F (36.7 C) 98.4 F (36.9 C)  SpO2: 100% 95%    History of present illness:  Nathan Miller is a 70 y.o. male with a history of CAD, diabetes mellitus, type 2, chronic lymphedema, complete heart block s/p PPM, chronic foot wound followed by wound care. Patient presented secondary to purulent drainage from right foot and found to have acute infection.  He received IV antibiotics empirically, IV vancomycin, Zosyn, IV clindamycin.  Seen by orthopedic surgery, Dr. Sharol Given.  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  Was evaluated by PT OT with recommendation for SNF.  TOC assisting with DC planning.    Hospital course complicated by acute blood loss anemia with iron deficiency.  He was transfused 1 unit PRBC and received infusion of IV Feraheme 510 mg x 1.  FOBT ordered and pending.   08/05/2021: Seen and examined at bedside.  He has no new complaints.  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 He received empiric IV vancomycin/Zosyn/clindamycin. CTA abdomen obtained by vascular surgery significant for right superficial femoral stenosis of high degree measuring 60% in addition to multifocal high-grade stenosis of left superficial femoral artery. Right transtibial amputation on 08/03/2021. On IV clindamycin, zosyn, and IV vancomycin perioperatively   Acute blood loss anemia postsurgery in [place] setting of iron deficiency anemia Baseline hemoglobin 12 Hemoglobin downtrending, 7.3 K. Obtain FOBT Transfuse 1 unit PRBC Significant iron deficiency on iron studies done on 08/03/2021 He is on iron supplement every other day, resume. Give 1 dose of Feraheme on 08/05/2021. Monitor H&H   Peripheral artery disease CTA abdomen obtained by vascular surgery significant for right superficial femoral stenosis of high degree measuring 60% in addition to multifocal high-grade stenosis of left superficial femoral artery. Continue aspirin 81 mg daily, Lipitor 80 mg daily   Prediabetes Hemoglobin A1C of 5.5%. Patient is on metformin as an outpatient which was held on admission. -Continue SSI -Carb modified diet   Essential hypertension On Coreg PTA Continue to monitor vital signs   CAD Denies any anginal symptoms. -Continue aspirin and Lipitor   Chronic diastolic CHF Grade III dysfunction with normal EF. Home torsemide is on hold. On gentle IV fluid hydration  Discharge Summary  Nathan Miller Nathan Miller:[id number] DOB: 1951-04-05  PCP: Nathan Miller date: 08/01/2021 Discharge date: 08/05/2021  Time spent: 35 minutes.  Recommendations for Outpatient Follow-up:  Follow-up with orthopedic surgery.  Discharge Diagnoses:  Active Hospital Problems   Diagnosis Date Noted   Diabetic foot infection ([place]) 08/01/2021   Obesity with body mass index (BMI) of 30.0 to 39.9 03/31/2021   Diabetes type 2, controlled (Nathan Miller) 01/29/2016   Complete heart block (Nathan Miller) 01/21/2015   Right foot ulcer (Nathan Miller) 01/21/2015   Essential hypertension 05/10/2011   Subacute osteomyelitis, right ankle and foot Nathan Miller)     Resolved Hospital Problems  No resolved problems to display.    Discharge Condition: Stable  Diet recommendation: Resume previous diet.  Vitals:   08/05/21 0815 08/05/21 1450  BP: (!) 141/69 (!) 157/61  Pulse: (!) 50 (!) 51  Resp: 14 16  Temp: 98 F (36.7 C) 98.4 F (36.9 C)  SpO2: 100% 95%    History of present illness:  Nathan Miller is a 70 y.o. male with a history of CAD, diabetes mellitus, type 2, chronic lymphedema, complete heart block s/p PPM, chronic foot wound followed by wound care. Patient presented secondary to purulent drainage from right foot and found to have acute infection.  He received IV antibiotics empirically, IV vancomycin, Zosyn, IV clindamycin.  Seen by orthopedic surgery, Dr. Sharol Given.  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  Was evaluated by PT OT with recommendation for SNF.  TOC assisting with DC planning.    Hospital course complicated by acute blood loss anemia with iron deficiency.  He was transfused 1 unit PRBC and received infusion of IV Feraheme 510 mg x 1.  FOBT ordered and pending.   08/05/2021: Seen and examined at bedside.  He has no new complaints.  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 He received empiric IV vancomycin/Zosyn/clindamycin. CTA abdomen obtained by vascular surgery significant for right superficial femoral stenosis of high degree measuring 60% in addition to multifocal high-grade stenosis of left superficial femoral artery. Right transtibial amputation on 08/03/2021. On IV clindamycin, zosyn, and IV vancomycin perioperatively   Acute blood loss anemia postsurgery in [place] setting of iron deficiency anemia Baseline hemoglobin 12 Hemoglobin downtrending, 7.3 K. Obtain FOBT Transfuse 1 unit PRBC Significant iron deficiency on iron studies done on 08/03/2021 He is on iron supplement every other day, resume. Give 1 dose of Feraheme on 08/05/2021. Monitor H&H   Peripheral artery disease CTA abdomen obtained by vascular surgery significant for right superficial femoral stenosis of high degree measuring 60% in addition to multifocal high-grade stenosis of left superficial femoral artery. Continue aspirin 81 mg daily, Lipitor 80 mg daily   Prediabetes Hemoglobin A1C of 5.5%. Patient is on metformin as an outpatient which was held on admission. -Continue SSI -Carb modified diet   Essential hypertension On Coreg PTA Continue to monitor vital signs   CAD Denies any anginal symptoms. -Continue aspirin and Lipitor   Chronic diastolic CHF Grade III dysfunction with normal EF. Home torsemide is on hold. On gentle IV fluid hydration  drainage from RIGHT foot wound, increased swelling and redness; told at [place] to go to ED for RIGHT foot infection, history of chronic osteomyelitis EXAM: RIGHT FOOT COMPLETE - 3+ VIEW COMPARISON:  04/05/2021 FINDINGS: Osseous demineralization. Marked soft tissue swelling. Foci of soft tissue gas are identified at Nathan posterior aspect of Nathan RIGHT ankle  extending into plantar aspect of foot laterally. Findings consistent with either penetrating wound or soft tissue infection by gas-forming organism. Extensive degenerative changes deformity throughout Nathan tarsals to MTP joints. Bone destruction at proximal RIGHT fifth meta tarsal and distal lateral cuboid again seen. Nonunion of old fracture at lateral base of proximal phalanx great toe. No acute fracture or bone destruction definitely visualized. IMPRESSION: Extensive soft tissue swelling with foci of soft tissue gas at Nathan lateral heel and posterior ankle either representing penetrating wound or infection by a gas-forming organism. Extensive degenerative changes and deformities throughout RIGHT foot similar to previous exam. No definite acute fracture or new bone destruction identified. Findings called to [facility] on 08/01/2021 at 1104 hours. Electronically Signed   By: [place] Miller M.D.   On: 08/01/2021 10:53   VAS [place] ABI WITH/WO TBI  Result Date: 08/01/2021  LOWER EXTREMITY DOPPLER STUDY [name]  Date of Exam:   08/01/2021 Medical Rec #: [id number]     Accession #:    [id number] Date of Birth: 1951-02-11    Patient Gender: M Patient Age:   7 years Exam Location:  [facility] Procedure:      VAS [place] ABI WITH/WO TBI Referring Phys: [place] Miller --------------------------------------------------------------------------------  Indications: Ulceration. High Risk Factors: Diabetes.  Comparison Study: No prior studies. Performing Technologist: [place] Miller RVT  Examination Guidelines: A complete evaluation includes at minimum, Doppler waveform signals and systolic blood pressure reading at Nathan level of bilateral brachial, anterior tibial, and posterior tibial arteries, when vessel segments are accessible. Bilateral testing is considered an integral part of a complete examination. Photoelectric Plethysmograph (PPG) waveforms and toe systolic pressure readings are included as required  and additional duplex testing as needed. Limited examinations for reoccurring indications may be performed as noted.  ABI Findings: +---------+------------------+-----+----------+--------+ Right    Rt Pressure (mmHg)IndexWaveform  Comment  +---------+------------------+-----+----------+--------+ Brachial 141                    triphasic          +---------+------------------+-----+----------+--------+ PTA      110               0.75 monophasic         +---------+------------------+-----+----------+--------+ DP       116               0.79 biphasic           +---------+------------------+-----+----------+--------+ Great Toe58                0.40                    +---------+------------------+-----+----------+--------+ +---------+------------------+-----+---------+-------+ Left     Lt Pressure (mmHg)IndexWaveform Comment +---------+------------------+-----+---------+-------+ Brachial 146                    triphasic        +---------+------------------+-----+---------+-------+ PTA      126               0.86 biphasic         +---------+------------------+-----+---------+-------+ DP

## 2021-08-05 NOTE — NC FL2 (Signed)
[place] MEDICAID FL2 LEVEL OF CARE SCREENING TOOL     IDENTIFICATION  Patient Name: Nathan Miller Birthdate: [birth year] Sex: male Admission Date (Current Location): 08/01/2021  [facility] and [place] Number:  Producer, television/film/video and Address:         Provider Number: [id number]  Attending Physician Name and Address:  Darlin Drop, DO  Relative Name and Phone Number:  Kaipo, Behning (Spouse)   [phone]    Current Level of Care: Hospital Recommended Level of Care: Skilled Nursing Facility Prior Approval Number:    Date Approved/Denied:   PASRR Number: [id number]  Discharge Plan: SNF    Current Diagnoses: Patient Active Problem List   Diagnosis Date Noted   Diabetic foot infection (HCC) 08/01/2021   Bacteremia 04/20/2021   Acute respiratory failure with hypoxia (HCC) 03/31/2021   Obesity with body mass index (BMI) of 30.0 to 39.9 03/31/2021   Elevated d-dimer 03/31/2021   Elevated brain natriuretic peptide (BNP) level 03/31/2021   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   Umbilical hernia 01/29/2016   Obesity 01/29/2016   Marijuana use 01/29/2016   Diabetes type 2, controlled (HCC) 01/29/2016   Diverticulitis of intestine with perforation without bleeding    Blood poisoning    Diverticulitis of intestine with perforation 06/13/2015   Diverticulitis 06/13/2015   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   Faintness    Bradycardia 01/21/2015   Syncope 01/21/2015   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   Right foot ulcer (HCC) 01/21/2015   Diabetes mellitus type 2, uncontrolled 01/21/2015   Hyperlipidemia LDL goal < 100 02/11/2013   Tobacco use disorder 02/11/2013   Anxiety state, unspecified 02/11/2013   Essential hypertension 05/10/2011   Papule 03/20/2011   Subacute osteomyelitis, right ankle and foot (HCC)    CAD S/P percutaneous coronary angioplasty; bms X 2 to RCA 2001 01/22/2000    Orientation RESPIRATION BLADDER Height & Weight     Time,  Situation, Place, Self  Other (Comment) (nasal cannula)   Weight: 260 lb (117.9 kg) Height:  5\' 9"  (175.3 cm)  BEHAVIORAL SYMPTOMS/MOOD NEUROLOGICAL BOWEL NUTRITION STATUS      Continent Diet (see d/c summary)  AMBULATORY STATUS COMMUNICATION OF NEEDS Skin   Extensive Assist Verbally Surgical wounds, Wound Vac (Going with Prevena ( home) wound vac)                       Personal Care Assistance Level of Assistance  Bathing, Feeding, Dressing Bathing Assistance: Maximum assistance Feeding assistance: Independent Dressing Assistance: Limited assistance     Functional Limitations Info  Sight, Hearing, Speech Sight Info: Adequate Hearing Info: Adequate Speech Info: Adequate    SPECIAL CARE FACTORS FREQUENCY  PT (By licensed PT), OT (By licensed OT)     PT Frequency: 5x/ week OT Frequency: 5x/ week            Contractures Contractures Info: Not present    Additional Factors Info  Code Status, Allergies, Insulin Sliding Scale Code Status Info: Full Allergies Info: NKA   Insulin Sliding Scale Info: see d/c med list       Current Medications (08/05/2021):  This is the current hospital active medication list Current Facility-Administered Medications  Medication Dose Route Frequency Provider Last Rate Last Admin   0.9 %  sodium chloride infusion (Manually program via Guardrails IV Fluids)   Intravenous Once Dow Adolph N, DO       acetaminophen (TYLENOL) tablet 650 mg  650 mg Oral Q6H  PRN Nadara Mustard, MD       Or   acetaminophen (TYLENOL) suppository 650 mg  650 mg Rectal Q6H PRN Nadara Mustard, MD       albuterol (PROVENTIL) (2.5 MG/3ML) 0.083% nebulizer solution 2.5 mg  2.5 mg Nebulization Q6H PRN Nadara Mustard, MD       alum & mag hydroxide-simeth (MAALOX/MYLANTA) 200-200-20 MG/5ML suspension 15-30 mL  15-30 mL Oral Q2H PRN Nadara Mustard, MD       ascorbic acid (VITAMIN C) tablet 1,000 mg  1,000 mg Oral Daily Nadara Mustard, MD   1,000 mg at 08/05/21 9323    aspirin EC tablet 81 mg  81 mg Oral Daily Nadara Mustard, MD   81 mg at 08/05/21 5573   atorvastatin (LIPITOR) tablet 80 mg  80 mg Oral Daily Nadara Mustard, MD   80 mg at 08/05/21 2202   bisacodyl (DULCOLAX) EC tablet 5 mg  5 mg Oral Daily PRN Nadara Mustard, MD   5 mg at 08/04/21 2108   carvedilol (COREG) tablet 25 mg  25 mg Oral BID Nadara Mustard, MD   25 mg at 08/05/21 5427   chlorhexidine (HIBICLENS) 4 % liquid 4 application  60 mL Topical Once Nadara Mustard, MD       Chlorhexidine Gluconate Cloth 2 % PADS 6 each  6 each Topical Daily Nadara Mustard, MD   6 each at 08/05/21 0939   docusate sodium (COLACE) capsule 100 mg  100 mg Oral Daily Nadara Mustard, MD   100 mg at 08/05/21 0938   enoxaparin (LOVENOX) injection 40 mg  40 mg Subcutaneous Q24H Nadara Mustard, MD   40 mg at 08/04/21 1554   fentaNYL (SUBLIMAZE) injection 50 mcg  50 mcg Intravenous Once Nadara Mustard, MD       ferrous sulfate tablet 325 mg  325 mg Oral Q breakfast Darlin Drop, DO   325 mg at 08/05/21 0623   ferumoxytol (FERAHEME) 510 mg in sodium chloride 0.9 % 100 mL IVPB  510 mg Intravenous Once Hall, Carole N, DO       guaiFENesin-dextromethorphan (ROBITUSSIN DM) 100-10 MG/5ML syrup 15 mL  15 mL Oral Q4H PRN Nadara Mustard, MD       hydrALAZINE (APRESOLINE) injection 5 mg  5 mg Intravenous Q20 Min PRN Nadara Mustard, MD       HYDROmorphone (DILAUDID) injection 0.5-1 mg  0.5-1 mg Intravenous Q4H PRN Nadara Mustard, MD       insulin aspart (novoLOG) injection 0-15 Units  0-15 Units Subcutaneous TID WC Nadara Mustard, MD   2 Units at 08/04/21 1648   labetalol (NORMODYNE) injection 10 mg  10 mg Intravenous Q10 min PRN Nadara Mustard, MD       magnesium citrate solution 1 Bottle  1 Bottle Oral Once PRN Nadara Mustard, MD       magnesium sulfate IVPB 2 g 50 mL  2 g Intravenous Daily PRN Nadara Mustard, MD       metoprolol tartrate (LOPRESSOR) injection 2-5 mg  2-5 mg Intravenous Q2H PRN Nadara Mustard, MD       nutrition  supplement (JUVEN) (JUVEN) powder packet 1 packet  1 packet Oral BID BM Nadara Mustard, MD   1 packet at 08/05/21 1449   ondansetron (ZOFRAN) tablet 4 mg  4 mg Oral Q6H PRN Nadara Mustard, MD  Or   ondansetron (ZOFRAN) injection 4 mg  4 mg Intravenous Q6H PRN Nadara Mustard, MD       oxyCODONE (Oxy IR/ROXICODONE) immediate release tablet 5 mg  5 mg Oral Q4H PRN Nadara Mustard, MD   5 mg at 08/05/21 1449   pantoprazole (PROTONIX) EC tablet 40 mg  40 mg Oral Daily Nadara Mustard, MD   40 mg at 08/05/21 0938   phenol (CHLORASEPTIC) mouth spray 1 spray  1 spray Mouth/Throat PRN Nadara Mustard, MD       polyethylene glycol (MIRALAX / GLYCOLAX) packet 17 g  17 g Oral Daily PRN Nadara Mustard, MD       potassium chloride SA (KLOR-CON) CR tablet 20-40 mEq  20-40 mEq Oral Daily PRN Nadara Mustard, MD       povidone-iodine 10 % swab 2 application  2 application Topical Once Nadara Mustard, MD       torsemide [facility]) tablet 20 mg  20 mg Oral Daily Nadara Mustard, MD   20 mg at 08/05/21 0937   umeclidinium bromide (INCRUSE ELLIPTA) 62.5 MCG/INH 1 puff  1 puff Inhalation Daily Nadara Mustard, MD   1 puff at 08/05/21 0810   zinc sulfate capsule 220 mg  220 mg Oral Daily Nadara Mustard, MD   220 mg at 08/05/21 1610     Discharge Medications: Please see discharge summary for a list of discharge medications.  Relevant Imaging Results:  Relevant Lab Results:   Additional Information SSN:  [id number];Pfizer COVID-19 Vaccine 07/19/2020 , 06/28/2020; 5'9" 260lbs  Rekisha Welling F Jeydan Barner, LCSWA

## 2021-08-05 NOTE — Progress Notes (Signed)
Blood transfusion started at this time. Pt aware of signs and symptoms to monitor for allergic reaction. Consent signed. VSS, see epic charting. Will monitor at bedside for first 15 minutes

## 2021-08-05 NOTE — TOC Initial Note (Signed)
Transition of Care [facility]) - Initial/Assessment Note    Patient Details  Name: [place] Miller MRN: [id number] Date of Birth: [birth year]  [facility] CM/SW Contact:    Ralene Bathe, LCSWA Phone Number: 08/05/2021, 4:22 PM  Clinical Narrative:                  CSW received consult for possible SNF placement at time of discharge. CSW spoke with patient.  Patient expressed understanding of PT recommendation and is agreeable to SNF placement at time of discharge. Patient reports preference for [facility] or [facility] [place]. CSW discussed insurance authorization process and will provide Medicare SNF ratings list. Patient has received 2 COVID vaccines.  No further questions reported at this time.   Skilled Nursing Rehab Facilities-   [email or web address] Ratings out of 5 possible    Name Address  Phone # Quality Care Staffing Health Inspection Overall  [facility] [address], [place] [phone] 5 1 4 4   [facility]  [address] [place], [place] [phone] 3 1 5 4   [facility] [address] [place], [place] [phone] 3 1 1 1   [facility] [address] [phone] 2 2 4 4   [facility] [address] [place] [phone] 3 1 2 1   [facility] [id number], [place] [phone] 3 2 4 4   [facility] [address] [place] [phone] 5 1 2 2   [facility] [address], [place] [phone] 5 2 2 3   [facility] [address], [place] [phone] 5 1 2 2   [facility] [phone] [address], [place] [phone] 5 1 2 2   [facility] [address] [facility] [phone] 5 1 2 2   [address] [place]. Dorthula Matas [phone] 3 1 1 1           Patient Goals and CMS Choice        Expected Discharge Plan and Services           Expected Discharge Date: 08/05/21                                     Prior Living Arrangements/Services                       Activities of Daily Living Home Assistive Devices/Equipment: Other (Comment), CBG Meter, Oxygen (cane) ADL Screening (condition at time of admission) Patient's cognitive ability adequate to safely complete daily activities?: Yes Is the patient deaf or have difficulty hearing?: No Does the patient have difficulty seeing, even when wearing glasses/contacts?: No Does the patient have difficulty concentrating, remembering, or making decisions?: No Patient able to express need for assistance with ADLs?: Yes Does the patient have difficulty dressing or bathing?: No Independently performs ADLs?: No Communication: Independent Dressing (OT): Independent Grooming: Independent Feeding: Independent Bathing: Independent Toileting: Needs assistance Is this a change from baseline?: Pre-admission baseline In/Out Bed: Needs assistance Is this a change from baseline?: Pre-admission baseline Walks in Home: Needs assistance Is this a change from baseline?: Pre-admission baseline Does the patient have difficulty walking or climbing stairs?: Yes Weakness of Legs: Both Weakness of Arms/Hands: Both  Permission Sought/Granted                  Emotional Assessment  Admission diagnosis:  Diabetic foot infection (HCC) [J47.829, L08.9] Patient Active Problem List   Diagnosis Date Noted   Diabetic foot infection (HCC) 08/01/2021   Bacteremia 04/20/2021   Acute respiratory failure with hypoxia (HCC) 03/31/2021   Obesity with body mass index (BMI) of 30.0 to 39.9 03/31/2021   Elevated d-dimer 03/31/2021   Elevated brain natriuretic peptide (BNP) level 03/31/2021   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   Umbilical hernia 01/29/2016   Obesity 01/29/2016   Marijuana use 01/29/2016   Diabetes type 2, controlled (HCC) 01/29/2016   Diverticulitis of intestine with perforation  without bleeding    Blood poisoning    Diverticulitis of intestine with perforation 06/13/2015   Diverticulitis 06/13/2015   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   Faintness    Bradycardia 01/21/2015   Syncope 01/21/2015   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   Right foot ulcer (HCC) 01/21/2015   Diabetes mellitus type 2, uncontrolled 01/21/2015   Hyperlipidemia LDL goal < 100 02/11/2013   Tobacco use disorder 02/11/2013   Anxiety state, unspecified 02/11/2013   Essential hypertension 05/10/2011   Papule 03/20/2011   Subacute osteomyelitis, right ankle and foot (HCC)    CAD S/P percutaneous coronary angioplasty; bms X 2 to RCA 2001 01/22/2000    Class: Diagnosis of   PCP:  [facility]:   [facility] [address] [place] - [address] [place] [address] Phone: [phone] Fax: [phone]     Social Determinants of Health (SDOH) Interventions    Readmission Risk Interventions No flowsheet data found.

## 2021-08-05 NOTE — TOC Progression Note (Signed)
Transition of Care [facility]) - Initial/Assessment Note    Patient Details  Name: [place] Miller MRN: [id number] Date of Birth: 30-Oct-1951  Transition of [facility]) CM/SW Contact:    Ralene Bathe, LCSWA Phone Number: 08/05/2021, 4:23 PM  Clinical Narrative:                 CSW contacted admissions with [facility] and [facility] [place].  Both are reviewing the patient's referral.        Patient Goals and CMS Choice        Expected Discharge Plan and Services           Expected Discharge Date: 08/05/21                                    Prior Living Arrangements/Services                       Activities of Daily Living Home Assistive Devices/Equipment: Other (Comment), CBG Meter, Oxygen (cane) ADL Screening (condition at time of admission) Patient's cognitive ability adequate to safely complete daily activities?: Yes Is the patient deaf or have difficulty hearing?: No Does the patient have difficulty seeing, even when wearing glasses/contacts?: No Does the patient have difficulty concentrating, remembering, or making decisions?: No Patient able to express need for assistance with ADLs?: Yes Does the patient have difficulty dressing or bathing?: No Independently performs ADLs?: No Communication: Independent Dressing (OT): Independent Grooming: Independent Feeding: Independent Bathing: Independent Toileting: Needs assistance Is this a change from baseline?: Pre-admission baseline In/Out Bed: Needs assistance Is this a change from baseline?: Pre-admission baseline Walks in Home: Needs assistance Is this a change from baseline?: Pre-admission baseline Does the patient have difficulty walking or climbing stairs?: Yes Weakness of Legs: Both Weakness of Arms/Hands: Both  Permission Sought/Granted                  Emotional Assessment              Admission diagnosis:  Diabetic foot infection (HCC) [W29.562, L08.9] Patient Active  Problem List   Diagnosis Date Noted   Diabetic foot infection (HCC) 08/01/2021   Bacteremia 04/20/2021   Acute respiratory failure with hypoxia (HCC) 03/31/2021   Obesity with body mass index (BMI) of 30.0 to 39.9 03/31/2021   Elevated d-dimer 03/31/2021   Elevated brain natriuretic peptide (BNP) level 03/31/2021   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   Umbilical hernia 01/29/2016   Obesity 01/29/2016   Marijuana use 01/29/2016   Diabetes type 2, controlled (HCC) 01/29/2016   Diverticulitis of intestine with perforation without bleeding    Blood poisoning    Diverticulitis of intestine with perforation 06/13/2015   Diverticulitis 06/13/2015   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   Faintness    Bradycardia 01/21/2015   Syncope 01/21/2015   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   Right foot ulcer (HCC) 01/21/2015   Diabetes mellitus type 2, uncontrolled 01/21/2015   Hyperlipidemia LDL goal < 100 02/11/2013   Tobacco use disorder 02/11/2013   Anxiety state, unspecified 02/11/2013   Essential hypertension 05/10/2011   Papule 03/20/2011   Subacute osteomyelitis, right ankle and foot (HCC)    CAD S/P percutaneous coronary angioplasty; bms X 2 to RCA 2001 01/22/2000    Class: Diagnosis of   PCP:  [facility]:   [facility] [address]  [place] - [address] [place] [address] Phone: [phone] Fax: [phone]     Social Determinants of Health (SDOH) Interventions    Readmission Risk Interventions No flowsheet data found.

## 2021-08-06 LAB — GLUCOSE, CAPILLARY
Glucose-Capillary: 93 mg/dL (ref 70–99)
Glucose-Capillary: 105 mg/dL — ABNORMAL HIGH (ref 70–99)
Glucose-Capillary: 116 mg/dL — ABNORMAL HIGH (ref 70–99)
Glucose-Capillary: 84 mg/dL (ref 70–99)

## 2021-08-06 LAB — HEMOGLOBIN AND HEMATOCRIT, BLOOD
HCT: 23.7 % — ABNORMAL LOW (ref 39.0–52.0)
Hemoglobin: 7.2 g/dL — ABNORMAL LOW (ref 13.0–17.0)

## 2021-08-06 LAB — CREATININE, SERUM
Creatinine, Ser: 0.97 mg/dL (ref 0.61–1.24)
GFR, Estimated: >60 mL/min (ref >60)

## 2021-08-06 LAB — CULTURE, BLOOD (ROUTINE X 2)
Culture: NO GROWTH
Culture: NO GROWTH
Special Requests: ADEQUATE

## 2021-08-06 LAB — OCCULT BLOOD X 1 CARD TO LAB, STOOL: Fecal Occult Bld: POSITIVE — AB

## 2021-08-06 LAB — PREPARE RBC (CROSSMATCH)

## 2021-08-06 MED ORDER — SODIUM CHLORIDE 0.9% IV SOLUTION: Freq: Once | INTRAVENOUS | Status: DC

## 2021-08-06 MED ORDER — BISACODYL 10 MG RE SUPP: 10.0000 mg | Freq: Once | RECTAL | Status: DC

## 2021-08-06 MED ORDER — SENNOSIDES-DOCUSATE SODIUM 8.6-50 MG PO TABS
2.0000 | ORAL_TABLET | Freq: Every day | ORAL | Status: DC
  Administered 2021-08-06 – 2021-08-09 (×2): 2 via ORAL
  Filled 2021-08-06 (×4): qty 2

## 2021-08-06 MED ORDER — PEG 3350-KCL-NA BICARB-NACL 420 G PO SOLR
4000.0000 mL | Freq: Once | ORAL | Status: AC
  Administered 2021-08-06: 4000 mL via ORAL
  Filled 2021-08-06: qty 4000

## 2021-08-06 NOTE — Plan of Care (Signed)

## 2021-08-06 NOTE — H&P (View-Only) (Signed)
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  The patient's HGB on admission was at 8.2 g/dL and it slowly declined down to 7.2 g/dL.  On 04/12/2021 his HGB was at 11.4 g/dL and in the past he ranged from 11 - 14 for his HGB.  Since his admission he was started on Lovenox 40 mg QD for prophylaxis.  The patient denies any issues with hematochezia or melena.  Approximately 6 years ago he recalls having a colonoscopy with a physician at [place].  During that procedure he was noted to have some polyps.  Past Medical History:  Diagnosis Date   CAD (coronary artery disease)    NSTEMI in 2001. Cardiac cath showed: LAD: 40% proximal, LCX: 70% mid, RCA thrombotic occlusion in mid segment. s/p PCI and 2 overlapped BMSs to RCA.    CHF (congestive heart failure) (HCC)    Diabetes mellitus    Hypertension    Osteomyelitis ([place])    Presence of permanent cardiac pacemaker     Past Surgical History:  Procedure Laterality Date   AMPUTATION Right 08/03/2021   Procedure: AMPUTATION BELOW KNEE;  Surgeon: Newt Minion, MD;  Location: [facility];  Service: Orthopedics;  Laterality: Right;   BUBBLE STUDY  04/07/2021   Procedure: BUBBLE STUDY;  Surgeon: Elouise Munroe, MD;  Location: [facility];  Service: Cardiology;;   CARDIAC CATHETERIZATION     CORONARY ANGIOPLASTY     PERMANENT PACEMAKER INSERTION N/A 01/22/2015   MDT Advisa pacemaker implanted for complete heart block by Dr Rayann Heman   TEE WITHOUT CARDIOVERSION N/A 04/07/2021   Procedure: TRANSESOPHAGEAL ECHOCARDIOGRAM (TEE);  Surgeon: Elouise Munroe, MD;  Location: [facility];  Service: Cardiology;  Laterality: N/A;   TEMPORARY PACEMAKER INSERTION  N/A 01/21/2015   Procedure: TEMPORARY PACEMAKER INSERTION;  Surgeon: Leonie Man, MD;  Location: [facility] CATH LAB;  Service: Cardiovascular;  Laterality: N/A;    Family History  Problem Relation Age of Onset   Heart disease Father    COPD Father    Heart disease Paternal Grandfather     Social History:  reports that he quit smoking about 13 years ago. His smoking use included cigarettes. He started smoking about 52 years ago. He has a 12.00 pack-year smoking history. His smokeless tobacco use includes snuff. He reports current alcohol use of about 42.0 standard drinks per week. He reports that he does not use drugs.  Allergies: No Known Allergies  Medications: Scheduled:  sodium chloride   Intravenous Once   sodium chloride   Intravenous Once   vitamin C  1,000 mg Oral Daily   aspirin EC  81 mg Oral Daily   atorvastatin  80 mg Oral Daily   bisacodyl  10 mg Rectal Once   carvedilol  25 mg Oral BID   chlorhexidine  60 mL Topical Once   Chlorhexidine Gluconate Cloth  6 each Topical Daily   docusate sodium  100 mg Oral Daily   enoxaparin (LOVENOX) injection  40 mg Subcutaneous Q24H   fentaNYL (SUBLIMAZE) injection  50 mcg Intravenous Once   ferrous sulfate  325 mg Oral Q breakfast   insulin aspart  0-15 Units Subcutaneous TID WC   nutrition supplement (  JUVEN)  1 packet Oral BID BM   pantoprazole  40 mg Oral Daily   povidone-iodine  2 application Topical Once   senna-docusate  2 tablet Oral Daily   torsemide  20 mg Oral Daily   umeclidinium bromide  1 puff Inhalation Daily   zinc sulfate  220 mg Oral Daily   Continuous:  magnesium sulfate bolus IVPB      Results for orders placed or performed during the hospital encounter of 08/01/21 (from the past 24 hour(s))  Glucose, capillary     Status: Abnormal   Collection Time: 08/05/21 11:59 AM  Result Value Ref Range   Glucose-Capillary 120 (H) 70 - 99 mg/dL  Glucose, capillary     Status: Abnormal   Collection Time: 08/05/21  5:02  PM  Result Value Ref Range   Glucose-Capillary 113 (H) 70 - 99 mg/dL  Glucose, capillary     Status: Abnormal   Collection Time: 08/05/21  9:00 PM  Result Value Ref Range   Glucose-Capillary 139 (H) 70 - 99 mg/dL  Creatinine, serum     Status: None   Collection Time: 08/06/21  1:05 AM  Result Value Ref Range   Creatinine, Ser 0.97 0.61 - 1.24 mg/dL   GFR, Estimated >60 >60 mL/min  Hemoglobin and hematocrit, blood     Status: Abnormal   Collection Time: 08/06/21  6:19 AM  Result Value Ref Range   Hemoglobin 7.2 (L) 13.0 - 17.0 g/dL   HCT 23.7 (L) 39.0 - 52.0 %  Glucose, capillary     Status: None   Collection Time: 08/06/21  7:05 AM  Result Value Ref Range   Glucose-Capillary 93 70 - 99 mg/dL  Prepare RBC (crossmatch)     Status: None   Collection Time: 08/06/21  8:10 AM  Result Value Ref Range   Order Confirmation      ORDER PROCESSED BY BLOOD BANK Performed at [facility], [address], [place], [place] [address]      No results found.  ROS:  As stated above in the HPI otherwise negative.  Blood pressure (!) 166/64, pulse (!) 50, temperature 97.9 F (36.6 C), temperature source Oral, resp. rate 16, height 5\' 9"  (1.753 m), weight 116.2 kg, SpO2 94 %.    PE: Gen: NAD, Alert and Oriented HEENT:  [place]/AT, EOMI Neck: Supple, no LAD Lungs: CTA Bilaterally CV: RRR without M/G/R ABD: Soft, NTND, +BS Ext: No C/C/E  Assessment/Plan: 1) Anemia. 2) History of colonic polyps. 3) DM. 4) S/p right foot amputation.   The patient's anemia worsened before his admission, however, it is not clear about the source.  He denies any over issues with bleeding from the GI tract, however, it is prudent to evaluate him with an EGD/colonoscopy.  Since this is an [facility] patient Dr. Therisa Doyne will assume care and perform the procedures tomorrow.  Plan: 1) Agree with the blood transfusion. 2) EGD/colonoscopy with Dr. Therisa Doyne tomorrow.  Shelita Steptoe D 08/06/2021, 9:16 AM

## 2021-08-06 NOTE — Consult Note (Signed)
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  The patient's HGB on admission was at 8.2 g/dL and it slowly declined down to 7.2 g/dL.  On 04/12/2021 his HGB was at 11.4 g/dL and in the past he ranged from 11 - 14 for his HGB.  Since his admission he was started on Lovenox 40 mg QD for prophylaxis.  The patient denies any issues with hematochezia or melena.  Approximately 6 years ago he recalls having a colonoscopy with a physician at [place].  During that procedure he was noted to have some polyps.  Past Medical History:  Diagnosis Date   CAD (coronary artery disease)    NSTEMI in 2001. Cardiac cath showed: LAD: 40% proximal, LCX: 70% mid, RCA thrombotic occlusion in mid segment. s/p PCI and 2 overlapped BMSs to RCA.    CHF (congestive heart failure) (HCC)    Diabetes mellitus    Hypertension    Osteomyelitis ([place])    Presence of permanent cardiac pacemaker     Past Surgical History:  Procedure Laterality Date   AMPUTATION Right 08/03/2021   Procedure: AMPUTATION BELOW KNEE;  Surgeon: Newt Minion, MD;  Location: [facility];  Service: Orthopedics;  Laterality: Right;   BUBBLE STUDY  04/07/2021   Procedure: BUBBLE STUDY;  Surgeon: Elouise Munroe, MD;  Location: [facility];  Service: Cardiology;;   CARDIAC CATHETERIZATION     CORONARY ANGIOPLASTY     PERMANENT PACEMAKER INSERTION N/A 01/22/2015   MDT Advisa pacemaker implanted for complete heart block by Dr Rayann Heman   TEE WITHOUT CARDIOVERSION N/A 04/07/2021   Procedure: TRANSESOPHAGEAL ECHOCARDIOGRAM (TEE);  Surgeon: Elouise Munroe, MD;  Location: [facility];  Service: Cardiology;  Laterality: N/A;   TEMPORARY PACEMAKER INSERTION  N/A 01/21/2015   Procedure: TEMPORARY PACEMAKER INSERTION;  Surgeon: Leonie Man, MD;  Location: [facility];  Service: Cardiovascular;  Laterality: N/A;    Family History  Problem Relation Age of Onset   Heart disease Father    COPD Father    Heart disease Paternal Grandfather     Social History:  reports that he quit smoking about 13 years ago. His smoking use included cigarettes. He started smoking about 52 years ago. He has a 12.00 pack-year smoking history. His smokeless tobacco use includes snuff. He reports current alcohol use of about 42.0 standard drinks per week. He reports that he does not use drugs.  Allergies: No Known Allergies  Medications: Scheduled:  sodium chloride   Intravenous Once   sodium chloride   Intravenous Once   vitamin C  1,000 mg Oral Daily   aspirin EC  81 mg Oral Daily   atorvastatin  80 mg Oral Daily   bisacodyl  10 mg Rectal Once   carvedilol  25 mg Oral BID   chlorhexidine  60 mL Topical Once   Chlorhexidine Gluconate Cloth  6 each Topical Daily   docusate sodium  100 mg Oral Daily   enoxaparin (LOVENOX) injection  40 mg Subcutaneous Q24H   fentaNYL (SUBLIMAZE) injection  50 mcg Intravenous Once   ferrous sulfate  325 mg Oral Q breakfast   insulin aspart  0-15 Units Subcutaneous TID WC   nutrition supplement (  JUVEN)  1 packet Oral BID BM   pantoprazole  40 mg Oral Daily   povidone-iodine  2 application Topical Once   senna-docusate  2 tablet Oral Daily   torsemide  20 mg Oral Daily   umeclidinium bromide  1 puff Inhalation Daily   zinc sulfate  220 mg Oral Daily   Continuous:  magnesium sulfate bolus IVPB      Results for orders placed or performed during the hospital encounter of 08/01/21 (from the past 24 hour(s))  Glucose, capillary     Status: Abnormal   Collection Time: 08/05/21 11:59 AM  Result Value Ref Range   Glucose-Capillary 120 (H) 70 - 99 mg/dL  Glucose, capillary     Status: Abnormal   Collection Time: 08/05/21  5:02  PM  Result Value Ref Range   Glucose-Capillary 113 (H) 70 - 99 mg/dL  Glucose, capillary     Status: Abnormal   Collection Time: 08/05/21  9:00 PM  Result Value Ref Range   Glucose-Capillary 139 (H) 70 - 99 mg/dL  Creatinine, serum     Status: None   Collection Time: 08/06/21  1:05 AM  Result Value Ref Range   Creatinine, Ser 0.97 0.61 - 1.24 mg/dL   GFR, Estimated >60 >60 mL/min  Hemoglobin and hematocrit, blood     Status: Abnormal   Collection Time: 08/06/21  6:19 AM  Result Value Ref Range   Hemoglobin 7.2 (L) 13.0 - 17.0 g/dL   HCT 23.7 (L) 39.0 - 52.0 %  Glucose, capillary     Status: None   Collection Time: 08/06/21  7:05 AM  Result Value Ref Range   Glucose-Capillary 93 70 - 99 mg/dL  Prepare RBC (crossmatch)     Status: None   Collection Time: 08/06/21  8:10 AM  Result Value Ref Range   Order Confirmation      ORDER PROCESSED BY BLOOD BANK Performed at [facility], [address] [place], [place] [address]      No results found.  ROS:  As stated above in the HPI otherwise negative.  Blood pressure (!) 166/64, pulse (!) 50, temperature 97.9 F (36.6 C), temperature source Oral, resp. rate 16, height 5\' 9"  (1.753 m), weight 116.2 kg, SpO2 94 %.    PE: Gen: NAD, Alert and Oriented HEENT:  [place]/AT, EOMI Neck: Supple, no LAD Lungs: CTA Bilaterally CV: RRR without M/G/R ABD: Soft, NTND, +BS Ext: No C/C/E  Assessment/Plan: 1) Anemia. 2) History of colonic polyps. 3) DM. 4) S/p right foot amputation.   The patient's anemia worsened before his admission, however, it is not clear about the source.  He denies any over issues with bleeding from the GI tract, however, it is prudent to evaluate him with an EGD/colonoscopy.  Since this is an [facility] patient Dr. Therisa Doyne will assume care and perform the procedures tomorrow.  Plan: 1) Agree with the blood transfusion. 2) EGD/colonoscopy with Dr. Therisa Doyne tomorrow.  Joua Bake D 08/06/2021, 9:16 AM

## 2021-08-06 NOTE — Progress Notes (Addendum)
Progress note  Nathan Miller EXB:[id number] DOB: [birth year]  PCP: [place], [place] date: 08/01/2021 Discharge date: 08/06/2021  Time spent: 35 minutes.  Recommendations for Outpatient Follow-up:  Follow-up with orthopedic surgery.  Discharge Diagnoses:  Active Hospital Problems   Diagnosis Date Noted   Diabetic foot infection ([place]) 08/01/2021   Obesity with body mass index (BMI) of 30.0 to 39.9 03/31/2021   Diabetes type 2, controlled ([place]) 01/29/2016   Complete heart block ([place]) 01/21/2015   Right foot ulcer ([place]) 01/21/2015   Essential hypertension 05/10/2011   Subacute osteomyelitis, right ankle and foot [facility])     Resolved Hospital Problems  No resolved problems to display.    Discharge Condition: Stable  Diet recommendation: Resume previous diet.  Vitals:   08/06/21 0756 08/06/21 0938  BP: (!) 166/64   Pulse: (!) 50   Resp: 16   Temp: 97.9 F (36.6 C)   SpO2: 94% 95%    History of present illness:  Nathan Miller is a 70 y.o. male with a history of CAD, diabetes mellitus, type 2, chronic lymphedema, complete heart block s/p PPM, chronic foot wound followed by wound care. Patient presented secondary to purulent drainage from right foot and found to have acute infection.  He received IV antibiotics empirically, IV vancomycin, Zosyn, IV clindamycin.  Seen by orthopedic surgery, Dr. Sharol Given.  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  Was evaluated by PT OT with recommendation for SNF.  TOC assisting with DC planning.    Hospital course complicated by acute blood loss anemia with iron deficiency.  He was transfused 1 unit PRBC and received infusion of IV Feraheme 510 mg x 1.  FOBT ordered and pending.  He has been constipated.  Bowel regimen in place.   08/06/2021: Patient was seen and examined at his bedside.  Endorses feeling constipated.  He denies any abdominal pain.  No nausea or vomiting at the time of this visit.  He has a  drop in hemoglobin this morning 7.2 from 7.7 which under corrected yesterday after 1 unit PRBC transfusion.  Patient has been found to have severe iron deficiency.  GI consulted to rule out GI cause of his profound iron deficiency anemia.    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 Sharol Given. He received empiric IV vancomycin/Zosyn/clindamycin pre and perioperatively. CTA abdomen obtained by vascular surgery significant for right superficial femoral stenosis of high degree measuring 60% in addition to multifocal high-grade stenosis of left superficial femoral artery. Right transtibial amputation on 08/03/2021. Received IV clindamycin, zosyn, and IV vancomycin perioperatively Is currently off antibiotics, afebrile with no leukocytosis.   Acute blood loss anemia postsurgery in the setting of iron deficiency anemia, baseline hemoglobin 12 Hemoglobin downtrending, despite blood transfusion, 1 unit on 08/05/2021.  FOBT pending due to no bowel movement. Hemoglobin this morning 7.2.   1 unit PRBC ordered to be transfused. Significant iron deficiency on iron studies done on 08/03/2021 He is on iron supplement every other day at home, resume. Received 1 dose of Feraheme on 08/05/2021. GI consulted, Dr. Benson Norway.  of subcutaneous emphysema tracks superiorly into the superficial posterolateral distal lower leg. Diffuse skin thickening and soft tissue swelling. No soft tissue mass. IMPRESSION: 1. Progressive deep soft tissue ulceration along the lateral and plantar aspect of the hindfoot with surrounding prominent phlegmonous change but without well-defined drainable fluid collection. Small amount of subcutaneous emphysema tracks superiorly into the superficial posterolateral distal lower leg, most likely associated with the  patient's wound rather than necrotizing infection. 2. No CT evidence of acute osteomyelitis. 3. 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 Electronically Signed   By: Titus Dubin M.D.   On: 08/01/2021 14:06   DG Foot Complete Right  Result Date: 08/01/2021 CLINICAL DATA:  Purulent drainage from RIGHT foot wound, increased swelling and redness; told at [facility] to go to ED for RIGHT foot infection, history of chronic osteomyelitis EXAM: RIGHT FOOT COMPLETE - 3+ VIEW COMPARISON:  04/05/2021 FINDINGS: Osseous demineralization. Marked soft tissue swelling. Foci of soft tissue gas are identified at the posterior aspect of the RIGHT ankle extending into plantar aspect of foot laterally. Findings consistent with either penetrating wound or soft tissue infection by gas-forming organism. Extensive degenerative changes deformity throughout the tarsals to MTP joints. Bone destruction at proximal RIGHT fifth meta tarsal and distal lateral cuboid again seen. Nonunion of old fracture at lateral base of proximal phalanx great toe. No acute fracture or bone destruction definitely visualized. IMPRESSION: Extensive soft tissue swelling with foci of soft tissue gas at the lateral heel and posterior ankle either representing penetrating wound or infection by a gas-forming organism. Extensive degenerative changes and deformities throughout RIGHT foot similar to previous exam. No definite acute fracture or new bone destruction identified. Findings called to [facility] on 08/01/2021 at 1104 hours. Electronically Signed   By: Lavonia Dana M.D.   On: 08/01/2021 10:53   VAS [place] ABI WITH/WO TBI  Result Date: 08/01/2021  LOWER EXTREMITY DOPPLER STUDY [name]  Date of Exam:   08/01/2021 Medical Rec #: [id number]     Accession #:    [id number] Date of Birth: Oct 21, 1951    Patient Gender: M Patient Age:   39 years Exam Location:  [facility] Procedure:      VAS  [place] ABI WITH/WO TBI Referring Phys: Cherylann Ratel --------------------------------------------------------------------------------  Indications: Ulceration. High Risk Factors: Diabetes.  Comparison Study: No prior studies. Performing Technologist: Carlos Levering RVT  Examination Guidelines: A complete evaluation includes at minimum, Doppler waveform signals and systolic blood pressure reading at the level of bilateral brachial, anterior tibial, and posterior tibial arteries, when vessel segments are accessible. Bilateral testing is considered an integral part of a complete examination. Photoelectric Plethysmograph (PPG) waveforms and toe systolic pressure readings are included as required and additional duplex testing as needed. Limited examinations for reoccurring indications may be performed as noted.  ABI Findings: +---------+------------------+-----+----------+--------+ Right    Rt Pressure (mmHg)IndexWaveform  Comment  +---------+------------------+-----+----------+--------+ Brachial 141                    triphasic          +---------+------------------+-----+----------+--------+ PTA      110               0.75 monophasic         +---------+------------------+-----+----------+--------+ DP       116               0.79 biphasic           +---------+------------------+-----+----------+--------+  of subcutaneous emphysema tracks superiorly into the superficial posterolateral distal lower leg. Diffuse skin thickening and soft tissue swelling. No soft tissue mass. IMPRESSION: 1. Progressive deep soft tissue ulceration along the lateral and plantar aspect of the hindfoot with surrounding prominent phlegmonous change but without well-defined drainable fluid collection. Small amount of subcutaneous emphysema tracks superiorly into the superficial posterolateral distal lower leg, most likely associated with the  patient's wound rather than necrotizing infection. 2. No CT evidence of acute osteomyelitis. 3. 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 Electronically Signed   By: Titus Dubin M.D.   On: 08/01/2021 14:06   DG Foot Complete Right  Result Date: 08/01/2021 CLINICAL DATA:  Purulent drainage from RIGHT foot wound, increased swelling and redness; told at [facility] to go to ED for RIGHT foot infection, history of chronic osteomyelitis EXAM: RIGHT FOOT COMPLETE - 3+ VIEW COMPARISON:  04/05/2021 FINDINGS: Osseous demineralization. Marked soft tissue swelling. Foci of soft tissue gas are identified at the posterior aspect of the RIGHT ankle extending into plantar aspect of foot laterally. Findings consistent with either penetrating wound or soft tissue infection by gas-forming organism. Extensive degenerative changes deformity throughout the tarsals to MTP joints. Bone destruction at proximal RIGHT fifth meta tarsal and distal lateral cuboid again seen. Nonunion of old fracture at lateral base of proximal phalanx great toe. No acute fracture or bone destruction definitely visualized. IMPRESSION: Extensive soft tissue swelling with foci of soft tissue gas at the lateral heel and posterior ankle either representing penetrating wound or infection by a gas-forming organism. Extensive degenerative changes and deformities throughout RIGHT foot similar to previous exam. No definite acute fracture or new bone destruction identified. Findings called to [facility] on 08/01/2021 at 1104 hours. Electronically Signed   By: Lavonia Dana M.D.   On: 08/01/2021 10:53   VAS [place] ABI WITH/WO TBI  Result Date: 08/01/2021  LOWER EXTREMITY DOPPLER STUDY [name]  Date of Exam:   08/01/2021 Medical Rec #: [id number]     Accession #:    [id number] Date of Birth: Oct 21, 1951    Patient Gender: M Patient Age:   39 years Exam Location:  [facility] Procedure:      VAS  [place] ABI WITH/WO TBI Referring Phys: Cherylann Ratel --------------------------------------------------------------------------------  Indications: Ulceration. High Risk Factors: Diabetes.  Comparison Study: No prior studies. Performing Technologist: Carlos Levering RVT  Examination Guidelines: A complete evaluation includes at minimum, Doppler waveform signals and systolic blood pressure reading at the level of bilateral brachial, anterior tibial, and posterior tibial arteries, when vessel segments are accessible. Bilateral testing is considered an integral part of a complete examination. Photoelectric Plethysmograph (PPG) waveforms and toe systolic pressure readings are included as required and additional duplex testing as needed. Limited examinations for reoccurring indications may be performed as noted.  ABI Findings: +---------+------------------+-----+----------+--------+ Right    Rt Pressure (mmHg)IndexWaveform  Comment  +---------+------------------+-----+----------+--------+ Brachial 141                    triphasic          +---------+------------------+-----+----------+--------+ PTA      110               0.75 monophasic         +---------+------------------+-----+----------+--------+ DP       116               0.79 biphasic           +---------+------------------+-----+----------+--------+  Progress note  Nathan Miller EXB:[id number] DOB: [birth year]  PCP: [place], [place] date: 08/01/2021 Discharge date: 08/06/2021  Time spent: 35 minutes.  Recommendations for Outpatient Follow-up:  Follow-up with orthopedic surgery.  Discharge Diagnoses:  Active Hospital Problems   Diagnosis Date Noted   Diabetic foot infection ([place]) 08/01/2021   Obesity with body mass index (BMI) of 30.0 to 39.9 03/31/2021   Diabetes type 2, controlled ([place]) 01/29/2016   Complete heart block ([place]) 01/21/2015   Right foot ulcer ([place]) 01/21/2015   Essential hypertension 05/10/2011   Subacute osteomyelitis, right ankle and foot [facility])     Resolved Hospital Problems  No resolved problems to display.    Discharge Condition: Stable  Diet recommendation: Resume previous diet.  Vitals:   08/06/21 0756 08/06/21 0938  BP: (!) 166/64   Pulse: (!) 50   Resp: 16   Temp: 97.9 F (36.6 C)   SpO2: 94% 95%    History of present illness:  Nathan Miller is a 70 y.o. male with a history of CAD, diabetes mellitus, type 2, chronic lymphedema, complete heart block s/p PPM, chronic foot wound followed by wound care. Patient presented secondary to purulent drainage from right foot and found to have acute infection.  He received IV antibiotics empirically, IV vancomycin, Zosyn, IV clindamycin.  Seen by orthopedic surgery, Dr. Sharol Given.  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  Was evaluated by PT OT with recommendation for SNF.  TOC assisting with DC planning.    Hospital course complicated by acute blood loss anemia with iron deficiency.  He was transfused 1 unit PRBC and received infusion of IV Feraheme 510 mg x 1.  FOBT ordered and pending.  He has been constipated.  Bowel regimen in place.   08/06/2021: Patient was seen and examined at his bedside.  Endorses feeling constipated.  He denies any abdominal pain.  No nausea or vomiting at the time of this visit.  He has a  drop in hemoglobin this morning 7.2 from 7.7 which under corrected yesterday after 1 unit PRBC transfusion.  Patient has been found to have severe iron deficiency.  GI consulted to rule out GI cause of his profound iron deficiency anemia.    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 Sharol Given. He received empiric IV vancomycin/Zosyn/clindamycin pre and perioperatively. CTA abdomen obtained by vascular surgery significant for right superficial femoral stenosis of high degree measuring 60% in addition to multifocal high-grade stenosis of left superficial femoral artery. Right transtibial amputation on 08/03/2021. Received IV clindamycin, zosyn, and IV vancomycin perioperatively Is currently off antibiotics, afebrile with no leukocytosis.   Acute blood loss anemia postsurgery in the setting of iron deficiency anemia, baseline hemoglobin 12 Hemoglobin downtrending, despite blood transfusion, 1 unit on 08/05/2021.  FOBT pending due to no bowel movement. Hemoglobin this morning 7.2.   1 unit PRBC ordered to be transfused. Significant iron deficiency on iron studies done on 08/03/2021 He is on iron supplement every other day at home, resume. Received 1 dose of Feraheme on 08/05/2021. GI consulted, Dr. Benson Norway.  of subcutaneous emphysema tracks superiorly into the superficial posterolateral distal lower leg. Diffuse skin thickening and soft tissue swelling. No soft tissue mass. IMPRESSION: 1. Progressive deep soft tissue ulceration along the lateral and plantar aspect of the hindfoot with surrounding prominent phlegmonous change but without well-defined drainable fluid collection. Small amount of subcutaneous emphysema tracks superiorly into the superficial posterolateral distal lower leg, most likely associated with the  patient's wound rather than necrotizing infection. 2. No CT evidence of acute osteomyelitis. 3. 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 Electronically Signed   By: Titus Dubin M.D.   On: 08/01/2021 14:06   DG Foot Complete Right  Result Date: 08/01/2021 CLINICAL DATA:  Purulent drainage from RIGHT foot wound, increased swelling and redness; told at [facility] to go to ED for RIGHT foot infection, history of chronic osteomyelitis EXAM: RIGHT FOOT COMPLETE - 3+ VIEW COMPARISON:  04/05/2021 FINDINGS: Osseous demineralization. Marked soft tissue swelling. Foci of soft tissue gas are identified at the posterior aspect of the RIGHT ankle extending into plantar aspect of foot laterally. Findings consistent with either penetrating wound or soft tissue infection by gas-forming organism. Extensive degenerative changes deformity throughout the tarsals to MTP joints. Bone destruction at proximal RIGHT fifth meta tarsal and distal lateral cuboid again seen. Nonunion of old fracture at lateral base of proximal phalanx great toe. No acute fracture or bone destruction definitely visualized. IMPRESSION: Extensive soft tissue swelling with foci of soft tissue gas at the lateral heel and posterior ankle either representing penetrating wound or infection by a gas-forming organism. Extensive degenerative changes and deformities throughout RIGHT foot similar to previous exam. No definite acute fracture or new bone destruction identified. Findings called to [facility] on 08/01/2021 at 1104 hours. Electronically Signed   By: Lavonia Dana M.D.   On: 08/01/2021 10:53   VAS [place] ABI WITH/WO TBI  Result Date: 08/01/2021  LOWER EXTREMITY DOPPLER STUDY [name]  Date of Exam:   08/01/2021 Medical Rec #: [id number]     Accession #:    [id number] Date of Birth: Oct 21, 1951    Patient Gender: M Patient Age:   39 years Exam Location:  [facility] Procedure:      VAS  [place] ABI WITH/WO TBI Referring Phys: Cherylann Ratel --------------------------------------------------------------------------------  Indications: Ulceration. High Risk Factors: Diabetes.  Comparison Study: No prior studies. Performing Technologist: Carlos Levering RVT  Examination Guidelines: A complete evaluation includes at minimum, Doppler waveform signals and systolic blood pressure reading at the level of bilateral brachial, anterior tibial, and posterior tibial arteries, when vessel segments are accessible. Bilateral testing is considered an integral part of a complete examination. Photoelectric Plethysmograph (PPG) waveforms and toe systolic pressure readings are included as required and additional duplex testing as needed. Limited examinations for reoccurring indications may be performed as noted.  ABI Findings: +---------+------------------+-----+----------+--------+ Right    Rt Pressure (mmHg)IndexWaveform  Comment  +---------+------------------+-----+----------+--------+ Brachial 141                    triphasic          +---------+------------------+-----+----------+--------+ PTA      110               0.75 monophasic         +---------+------------------+-----+----------+--------+ DP       116               0.79 biphasic           +---------+------------------+-----+----------+--------+  of subcutaneous emphysema tracks superiorly into the superficial posterolateral distal lower leg. Diffuse skin thickening and soft tissue swelling. No soft tissue mass. IMPRESSION: 1. Progressive deep soft tissue ulceration along the lateral and plantar aspect of the hindfoot with surrounding prominent phlegmonous change but without well-defined drainable fluid collection. Small amount of subcutaneous emphysema tracks superiorly into the superficial posterolateral distal lower leg, most likely associated with the  patient's wound rather than necrotizing infection. 2. No CT evidence of acute osteomyelitis. 3. 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 Electronically Signed   By: Titus Dubin M.D.   On: 08/01/2021 14:06   DG Foot Complete Right  Result Date: 08/01/2021 CLINICAL DATA:  Purulent drainage from RIGHT foot wound, increased swelling and redness; told at [facility] to go to ED for RIGHT foot infection, history of chronic osteomyelitis EXAM: RIGHT FOOT COMPLETE - 3+ VIEW COMPARISON:  04/05/2021 FINDINGS: Osseous demineralization. Marked soft tissue swelling. Foci of soft tissue gas are identified at the posterior aspect of the RIGHT ankle extending into plantar aspect of foot laterally. Findings consistent with either penetrating wound or soft tissue infection by gas-forming organism. Extensive degenerative changes deformity throughout the tarsals to MTP joints. Bone destruction at proximal RIGHT fifth meta tarsal and distal lateral cuboid again seen. Nonunion of old fracture at lateral base of proximal phalanx great toe. No acute fracture or bone destruction definitely visualized. IMPRESSION: Extensive soft tissue swelling with foci of soft tissue gas at the lateral heel and posterior ankle either representing penetrating wound or infection by a gas-forming organism. Extensive degenerative changes and deformities throughout RIGHT foot similar to previous exam. No definite acute fracture or new bone destruction identified. Findings called to [facility] on 08/01/2021 at 1104 hours. Electronically Signed   By: Lavonia Dana M.D.   On: 08/01/2021 10:53   VAS [place] ABI WITH/WO TBI  Result Date: 08/01/2021  LOWER EXTREMITY DOPPLER STUDY [name]  Date of Exam:   08/01/2021 Medical Rec #: [id number]     Accession #:    [id number] Date of Birth: Oct 21, 1951    Patient Gender: M Patient Age:   39 years Exam Location:  [facility] Procedure:      VAS  [place] ABI WITH/WO TBI Referring Phys: Cherylann Ratel --------------------------------------------------------------------------------  Indications: Ulceration. High Risk Factors: Diabetes.  Comparison Study: No prior studies. Performing Technologist: Carlos Levering RVT  Examination Guidelines: A complete evaluation includes at minimum, Doppler waveform signals and systolic blood pressure reading at the level of bilateral brachial, anterior tibial, and posterior tibial arteries, when vessel segments are accessible. Bilateral testing is considered an integral part of a complete examination. Photoelectric Plethysmograph (PPG) waveforms and toe systolic pressure readings are included as required and additional duplex testing as needed. Limited examinations for reoccurring indications may be performed as noted.  ABI Findings: +---------+------------------+-----+----------+--------+ Right    Rt Pressure (mmHg)IndexWaveform  Comment  +---------+------------------+-----+----------+--------+ Brachial 141                    triphasic          +---------+------------------+-----+----------+--------+ PTA      110               0.75 monophasic         +---------+------------------+-----+----------+--------+ DP       116               0.79 biphasic           +---------+------------------+-----+----------+--------+  Progress note  Nathan Miller EXB:[id number] DOB: [birth year]  PCP: [place], [place] date: 08/01/2021 Discharge date: 08/06/2021  Time spent: 35 minutes.  Recommendations for Outpatient Follow-up:  Follow-up with orthopedic surgery.  Discharge Diagnoses:  Active Hospital Problems   Diagnosis Date Noted   Diabetic foot infection ([place]) 08/01/2021   Obesity with body mass index (BMI) of 30.0 to 39.9 03/31/2021   Diabetes type 2, controlled ([place]) 01/29/2016   Complete heart block ([place]) 01/21/2015   Right foot ulcer ([place]) 01/21/2015   Essential hypertension 05/10/2011   Subacute osteomyelitis, right ankle and foot [facility])     Resolved Hospital Problems  No resolved problems to display.    Discharge Condition: Stable  Diet recommendation: Resume previous diet.  Vitals:   08/06/21 0756 08/06/21 0938  BP: (!) 166/64   Pulse: (!) 50   Resp: 16   Temp: 97.9 F (36.6 C)   SpO2: 94% 95%    History of present illness:  Nathan Miller is a 70 y.o. male with a history of CAD, diabetes mellitus, type 2, chronic lymphedema, complete heart block s/p PPM, chronic foot wound followed by wound care. Patient presented secondary to purulent drainage from right foot and found to have acute infection.  He received IV antibiotics empirically, IV vancomycin, Zosyn, IV clindamycin.  Seen by orthopedic surgery, Dr. Sharol Given.  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  Was evaluated by PT OT with recommendation for SNF.  TOC assisting with DC planning.    Hospital course complicated by acute blood loss anemia with iron deficiency.  He was transfused 1 unit PRBC and received infusion of IV Feraheme 510 mg x 1.  FOBT ordered and pending.  He has been constipated.  Bowel regimen in place.   08/06/2021: Patient was seen and examined at his bedside.  Endorses feeling constipated.  He denies any abdominal pain.  No nausea or vomiting at the time of this visit.  He has a  drop in hemoglobin this morning 7.2 from 7.7 which under corrected yesterday after 1 unit PRBC transfusion.  Patient has been found to have severe iron deficiency.  GI consulted to rule out GI cause of his profound iron deficiency anemia.    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 Sharol Given. He received empiric IV vancomycin/Zosyn/clindamycin pre and perioperatively. CTA abdomen obtained by vascular surgery significant for right superficial femoral stenosis of high degree measuring 60% in addition to multifocal high-grade stenosis of left superficial femoral artery. Right transtibial amputation on 08/03/2021. Received IV clindamycin, zosyn, and IV vancomycin perioperatively Is currently off antibiotics, afebrile with no leukocytosis.   Acute blood loss anemia postsurgery in the setting of iron deficiency anemia, baseline hemoglobin 12 Hemoglobin downtrending, despite blood transfusion, 1 unit on 08/05/2021.  FOBT pending due to no bowel movement. Hemoglobin this morning 7.2.   1 unit PRBC ordered to be transfused. Significant iron deficiency on iron studies done on 08/03/2021 He is on iron supplement every other day at home, resume. Received 1 dose of Feraheme on 08/05/2021. GI consulted, Dr. Benson Norway.  Progress note  Nathan Miller EXB:[id number] DOB: [birth year]  PCP: [place], [place] date: 08/01/2021 Discharge date: 08/06/2021  Time spent: 35 minutes.  Recommendations for Outpatient Follow-up:  Follow-up with orthopedic surgery.  Discharge Diagnoses:  Active Hospital Problems   Diagnosis Date Noted   Diabetic foot infection ([place]) 08/01/2021   Obesity with body mass index (BMI) of 30.0 to 39.9 03/31/2021   Diabetes type 2, controlled ([place]) 01/29/2016   Complete heart block ([place]) 01/21/2015   Right foot ulcer ([place]) 01/21/2015   Essential hypertension 05/10/2011   Subacute osteomyelitis, right ankle and foot [facility])     Resolved Hospital Problems  No resolved problems to display.    Discharge Condition: Stable  Diet recommendation: Resume previous diet.  Vitals:   08/06/21 0756 08/06/21 0938  BP: (!) 166/64   Pulse: (!) 50   Resp: 16   Temp: 97.9 F (36.6 C)   SpO2: 94% 95%    History of present illness:  Nathan Miller is a 70 y.o. male with a history of CAD, diabetes mellitus, type 2, chronic lymphedema, complete heart block s/p PPM, chronic foot wound followed by wound care. Patient presented secondary to purulent drainage from right foot and found to have acute infection.  He received IV antibiotics empirically, IV vancomycin, Zosyn, IV clindamycin.  Seen by orthopedic surgery, Dr. Sharol Given.  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  Was evaluated by PT OT with recommendation for SNF.  TOC assisting with DC planning.    Hospital course complicated by acute blood loss anemia with iron deficiency.  He was transfused 1 unit PRBC and received infusion of IV Feraheme 510 mg x 1.  FOBT ordered and pending.  He has been constipated.  Bowel regimen in place.   08/06/2021: Patient was seen and examined at his bedside.  Endorses feeling constipated.  He denies any abdominal pain.  No nausea or vomiting at the time of this visit.  He has a  drop in hemoglobin this morning 7.2 from 7.7 which under corrected yesterday after 1 unit PRBC transfusion.  Patient has been found to have severe iron deficiency.  GI consulted to rule out GI cause of his profound iron deficiency anemia.    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 Sharol Given. He received empiric IV vancomycin/Zosyn/clindamycin pre and perioperatively. CTA abdomen obtained by vascular surgery significant for right superficial femoral stenosis of high degree measuring 60% in addition to multifocal high-grade stenosis of left superficial femoral artery. Right transtibial amputation on 08/03/2021. Received IV clindamycin, zosyn, and IV vancomycin perioperatively Is currently off antibiotics, afebrile with no leukocytosis.   Acute blood loss anemia postsurgery in the setting of iron deficiency anemia, baseline hemoglobin 12 Hemoglobin downtrending, despite blood transfusion, 1 unit on 08/05/2021.  FOBT pending due to no bowel movement. Hemoglobin this morning 7.2.   1 unit PRBC ordered to be transfused. Significant iron deficiency on iron studies done on 08/03/2021 He is on iron supplement every other day at home, resume. Received 1 dose of Feraheme on 08/05/2021. GI consulted, Dr. Benson Norway.

## 2021-08-07 ENCOUNTER — Inpatient Hospital Stay (HOSPITAL_COMMUNITY): Payer: PPO | Admitting: Certified Registered Nurse Anesthetist

## 2021-08-07 ENCOUNTER — Encounter (HOSPITAL_COMMUNITY)
Admission: EM | Disposition: A | Discharge status: Skilled Nursing Facility | Payer: Self-pay | Source: Home / Self Care | Attending: Internal Medicine

## 2021-08-07 ENCOUNTER — Encounter (HOSPITAL_COMMUNITY): Payer: Self-pay | Admitting: Internal Medicine

## 2021-08-07 HISTORY — PX: BIOPSY: SHX5522

## 2021-08-07 HISTORY — PX: POLYPECTOMY: SHX5525

## 2021-08-07 HISTORY — PX: ESOPHAGOGASTRODUODENOSCOPY (EGD) WITH PROPOFOL: SHX5813

## 2021-08-07 HISTORY — PX: COLONOSCOPY: SHX5424

## 2021-08-07 LAB — TYPE AND SCREEN
ABO/RH(D): O POS
Antibody Screen: NEGATIVE
Unit division: 0
Unit division: 0

## 2021-08-07 LAB — BPAM RBC
Blood Product Expiration Date: 202210132359
Blood Product Expiration Date: 202211022359
ISSUE DATE / TIME: 202210070202
ISSUE DATE / TIME: 202210081203
Unit Type and Rh: 5100
Unit Type and Rh: 5100

## 2021-08-07 LAB — GLUCOSE, CAPILLARY
Glucose-Capillary: 96 mg/dL (ref 70–99)
Glucose-Capillary: 97 mg/dL (ref 70–99)
Glucose-Capillary: 115 mg/dL — ABNORMAL HIGH (ref 70–99)
Glucose-Capillary: 134 mg/dL — ABNORMAL HIGH (ref 70–99)
Glucose-Capillary: 168 mg/dL — ABNORMAL HIGH (ref 70–99)
Glucose-Capillary: 171 mg/dL — ABNORMAL HIGH (ref 70–99)

## 2021-08-07 LAB — CBC
HCT: 27.8 % — ABNORMAL LOW (ref 39.0–52.0)
Hemoglobin: 8.7 g/dL — ABNORMAL LOW (ref 13.0–17.0)
MCH: 27.8 pg (ref 26.0–34.0)
MCHC: 31.3 g/dL (ref 30.0–36.0)
MCV: 88.8 fL (ref 80.0–100.0)
Platelets: 287 10*3/uL (ref 150–400)
RBC: 3.13 MIL/uL — ABNORMAL LOW (ref 4.22–5.81)
RDW: 15.7 % — ABNORMAL HIGH (ref 11.5–15.5)
WBC: 9.3 10*3/uL (ref 4.0–10.5)
nRBC: 0.3 % — ABNORMAL HIGH (ref 0.0–0.2)

## 2021-08-07 LAB — CREATININE, SERUM
Creatinine, Ser: 0.89 mg/dL (ref 0.61–1.24)
GFR, Estimated: >60 mL/min (ref >60)

## 2021-08-07 LAB — AEROBIC/ANAEROBIC CULTURE W GRAM STAIN (SURGICAL/DEEP WOUND): Gram Stain: NONE SEEN

## 2021-08-07 SURGERY — ESOPHAGOGASTRODUODENOSCOPY (EGD) WITH PROPOFOL
Anesthesia: Monitor Anesthesia Care

## 2021-08-07 MED ORDER — PANTOPRAZOLE SODIUM 40 MG PO TBEC
40.0000 mg | DELAYED_RELEASE_TABLET | Freq: Two times a day (BID) | ORAL | Status: DC
  Administered 2021-08-07 – 2021-08-09 (×5): 40 mg via ORAL
  Filled 2021-08-07 (×5): qty 1

## 2021-08-07 MED ORDER — PROPOFOL 500 MG/50ML IV EMUL
INTRAVENOUS | Status: DC | PRN
  Administered 2021-08-07: 100 ug/kg/min via INTRAVENOUS

## 2021-08-07 MED ORDER — PANTOPRAZOLE SODIUM 40 MG PO TBEC: 40.0000 mg | DELAYED_RELEASE_TABLET | Freq: Two times a day (BID) | ORAL | 1 refills | Status: DC

## 2021-08-07 MED ORDER — LACTATED RINGERS IV SOLN: INTRAVENOUS | Status: DC

## 2021-08-07 MED ORDER — LIDOCAINE 2% (20 MG/ML) 5 ML SYRINGE
INTRAMUSCULAR | Status: DC | PRN
  Administered 2021-08-07: 40 mg via INTRAVENOUS

## 2021-08-07 MED ORDER — PANTOPRAZOLE SODIUM 40 MG PO TBEC: 40.0000 mg | DELAYED_RELEASE_TABLET | Freq: Every day | ORAL | 0 refills | Status: DC

## 2021-08-07 MED ORDER — PROPOFOL 10 MG/ML IV BOLUS
INTRAVENOUS | Status: DC | PRN
  Administered 2021-08-07: 10 mg via INTRAVENOUS
  Administered 2021-08-07: 5 mg via INTRAVENOUS

## 2021-08-07 MED ORDER — EPHEDRINE SULFATE 50 MG/ML IJ SOLN
INTRAMUSCULAR | Status: DC | PRN
  Administered 2021-08-07 (×4): 5 mg via INTRAVENOUS

## 2021-08-07 SURGICAL SUPPLY — 15 items

## 2021-08-07 NOTE — Op Note (Signed)
[facility] Patient Name: Nathan Miller Procedure Date : 08/07/2021 MRN: [id number] Attending MD: Kerin Salen , MD Date of Birth: 12-27-1950 CSN: [id number] Age: 70 Admit Type: Inpatient Procedure:                Colonoscopy Indications:              Last colonoscopy: 2016, Iron deficiency anemia Providers:                Kerin Salen, MD, Brooke Person, Brion Aliment,                            Technician Referring MD:             [facility] Medicines:                Monitored Anesthesia Care Complications:            No immediate complications. Estimated blood loss:                            Minimal. Estimated Blood Loss:     Estimated blood loss was minimal. Procedure:                Pre-Anesthesia Assessment:                           - Prior to the procedure, a History and Physical                            was performed, and patient medications and                            allergies were reviewed. The patient's tolerance of                            previous anesthesia was also reviewed. The risks                            and benefits of the procedure and the sedation                            options and risks were discussed with the patient.                            All questions were answered, and informed consent                            was obtained. Prior Anticoagulants: The patient has                            taken no previous anticoagulant or antiplatelet                            agents except for aspirin. ASA Grade Assessment:                            III -  A patient with severe systemic disease. After                            reviewing the risks and benefits, the patient was                            deemed in satisfactory condition to undergo the                            procedure.                           - Prior to the procedure, a History and Physical                            was performed, and patient medications and                             allergies were reviewed. The patient's tolerance of                            previous anesthesia was also reviewed. The risks                            and benefits of the procedure and the sedation                            options and risks were discussed with the patient.                            All questions were answered, and informed consent                            was obtained. Prior Anticoagulants: The patient has                            taken no previous anticoagulant or antiplatelet                            agents except for aspirin. ASA Grade Assessment:                            III - A patient with severe systemic disease. After                            reviewing the risks and benefits, the patient was                            deemed in satisfactory condition to undergo the                            procedure.  After obtaining informed consent, the colonoscope                            was passed under direct vision. Throughout the                            procedure, the patient's blood pressure, pulse, and                            oxygen saturations were monitored continuously. The                            [id number]-HQ190TL ([id number] Olympus peds colonoscope was                            introduced through the anus and advanced to the the                            terminal ileum. The colonoscopy was performed                            without difficulty. The patient tolerated the                            procedure well. The quality of the bowel                            preparation was adequate to identify polyps 6 mm                            and larger in size. Scope In: 8:30:44 AM Scope Out: 8:45:42 AM Scope Withdrawal Time: 0 hours 11 minutes 6 seconds  Total Procedure Duration: 0 hours 14 minutes 58 seconds  Findings:      The perianal and digital rectal examinations were normal.      The  terminal ileum appeared normal.      A 3 mm polyp was found in the cecum. The polyp was sessile. The polyp       was removed with a cold biopsy forceps. Resection and retrieval were       complete.      A localized area of moderately nodular and thickened folds of the mucosa       was found in the recto-sigmoid colon. Biopsies were taken with a cold       forceps for histology.      Multiple large-mouthed diverticula were found in the sigmoid colon and       descending colon.      Non-bleeding internal hemorrhoids were found during retroflexion.      The exam was otherwise without abnormality. Impression:               - The examined portion of the ileum was normal.                           - One 3 mm polyp in the cecum, removed with a cold  biopsy forceps. Resected and retrieved.                           - Nodular and thickened folds of the mucosa in the                            recto-sigmoid colon. Biopsied.                           - Diverticulosis in the sigmoid colon and in the                            descending colon.                           - Non-bleeding internal hemorrhoids.                           - The examination was otherwise normal. Moderate Sedation:      Patient did not receive moderate sedation for this procedure, but       instead received monitored anesthesia care. Recommendation:           - High fiber diet.                           - Continue present medications.                           - Await pathology results.                           - Repeat colonoscopy for surveillance based on                            pathology results. Procedure Code(s):        --- Professional ---                           [phone], Colonoscopy, flexible; with biopsy, single                            or multiple Diagnosis Code(s):        --- Professional ---                           K64.8, Other hemorrhoids                           K63.5, Polyp  of colon                           K63.89, Other specified diseases of intestine                           D50.9, Iron deficiency anemia, unspecified                           K57.30, Diverticulosis of large  intestine without                            perforation or abscess without bleeding CPT copyright 2019 [employer]. All rights reserved. The codes documented in this report are preliminary and upon coder review may  be revised to meet current compliance requirements. Kerin Salen, MD 08/07/2021 8:55:02 AM This report has been signed electronically. Number of Addenda: 0

## 2021-08-07 NOTE — Transfer of Care (Signed)
Immediate Anesthesia Transfer of Care Note  Patient: Nathan Miller  Procedure(s) Performed: ESOPHAGOGASTRODUODENOSCOPY (EGD) WITH PROPOFOL COLONOSCOPY BIOPSY POLYPECTOMY  Patient Location: PACU  Anesthesia Type:MAC  Level of Consciousness: awake, alert  and patient cooperative  Airway & Oxygen Therapy: Patient Spontanous Breathing and Patient connected to nasal cannula oxygen  Post-op Assessment: Report given to RN and Post -op Vital signs reviewed and stable  Post vital signs: Reviewed and stable  Last Vitals:  Vitals Value Taken Time  BP 136/61 08/07/21 0855  Temp 36.3 C 08/07/21 0855  Pulse 51 08/07/21 0857  Resp 27 08/07/21 0857  SpO2 98 % 08/07/21 0857  Vitals shown include unvalidated device data.  Last Pain:  Vitals:   08/07/21 0855  TempSrc:   PainSc: 0-No pain      Patients Stated Pain Goal: 2 (96/75/91 6384)  Complications: No notable events documented.

## 2021-08-07 NOTE — Anesthesia Postprocedure Evaluation (Signed)
Anesthesia Post Note  Patient: ZHANE BLUITT  Procedure(s) Performed: ESOPHAGOGASTRODUODENOSCOPY (EGD) WITH PROPOFOL COLONOSCOPY BIOPSY POLYPECTOMY     Patient location during evaluation: PACU Anesthesia Type: MAC Level of consciousness: awake and alert Pain management: pain level controlled Vital Signs Assessment: post-procedure vital signs reviewed and stable Respiratory status: spontaneous breathing, nonlabored ventilation, respiratory function stable and patient connected to nasal cannula oxygen Cardiovascular status: stable and blood pressure returned to baseline Postop Assessment: no apparent nausea or vomiting Anesthetic complications: no   No notable events documented.  Last Vitals:  Vitals:   08/07/21 0855 08/07/21 0910  BP: 136/61 (!) 163/67  Pulse: (!) 51 (!) 51  Resp: 15 18  Temp: (!) 36.3 C (!) 36.4 C  SpO2: 100% 98%    Last Pain:  Vitals:   08/07/21 0910  TempSrc:   PainSc: 0-No pain                 Effie Berkshire

## 2021-08-07 NOTE — Anesthesia Procedure Notes (Signed)
Procedure Name: MAC Date/Time: 08/07/2021 8:15 AM Performed by: Janene Harvey, CRNA Pre-anesthesia Checklist: Patient identified, Emergency Drugs available, Suction available and Patient being monitored Patient Re-evaluated:Patient Re-evaluated prior to induction Oxygen Delivery Method: Nasal cannula Induction Type: IV induction Placement Confirmation: positive ETCO2 Dental Injury: Teeth and Oropharynx as per pre-operative assessment

## 2021-08-07 NOTE — Interval H&P Note (Signed)
History and Physical Interval Note:  69/male with anemia, drop in Hb without  obvious melena or hematochezia, had a tubular adenoma removed in 2016, for an EGD and colonoscopy with propofol.  08/07/2021 7:54 AM  Nathan Miller  has presented today for EGD and colonoscopy, with the diagnosis of Anemia.  The various methods of treatment have been discussed with the patient and family. After consideration of risks, benefits and other options for treatment, the patient has consented to  Procedure(s): ESOPHAGOGASTRODUODENOSCOPY (EGD) WITH PROPOFOL (N/A) COLONOSCOPY (N/A) as a surgical intervention.  The patient's history has been reviewed, patient examined, no change in status, stable for surgery.  I have reviewed the patient's chart and labs.  Questions were answered to the patient's satisfaction.     Ronnette Juniper

## 2021-08-07 NOTE — Anesthesia Preprocedure Evaluation (Addendum)
Anesthesia Evaluation  Patient identified by MRN, date of birth, ID band Patient awake    Reviewed: Allergy & Precautions, NPO status , Patient's Chart, lab work & pertinent test results  Airway Mallampati: I  TM Distance: >3 FB Neck ROM: Full    Dental  (+) Edentulous Upper, Edentulous Lower   Pulmonary former smoker,  - Oxygen dependent   breath sounds clear to auscultation       Cardiovascular hypertension, + CAD and +CHF  + dysrhythmias + pacemaker  Rhythm:Regular Rate:Bradycardia     Neuro/Psych Anxiety    GI/Hepatic negative GI ROS, Neg liver ROS,   Endo/Other  diabetes  Renal/GU negative Renal ROS     Musculoskeletal   Abdominal Normal abdominal exam  (+)   Peds  Hematology negative hematology ROS (+)   Anesthesia Other Findings   Reproductive/Obstetrics                            Anesthesia Physical Anesthesia Plan  ASA: 3  Anesthesia Plan: MAC   Post-op Pain Management:    Induction:   PONV Risk Score and Plan: 0 and Propofol infusion  Airway Management Planned: Natural Airway and Simple Face Mask  Additional Equipment: None  Intra-op Plan:   Post-operative Plan:   Informed Consent:   Plan Discussed with: CRNA  Anesthesia Plan Comments:         Anesthesia Quick Evaluation

## 2021-08-07 NOTE — Op Note (Signed)
[facility] Patient Name: Nathan Miller Procedure Date : 08/07/2021 MRN: [id number] Attending MD: Kerin Salen , MD Date of Birth: 06-14-1951 CSN: [id number] Age: 70 Admit Type: Inpatient Procedure:                Upper GI endoscopy Indications:              Iron deficiency anemia Providers:                Kerin Salen, MD, Brooke Person, Brion Aliment,                            Technician Referring MD:             [facility] Medicines:                Monitored Anesthesia Care Complications:            No immediate complications. Estimated blood loss:                            Minimal. Estimated Blood Loss:     Estimated blood loss was minimal. Procedure:                Pre-Anesthesia Assessment:                           - Prior to the procedure, a History and Physical                            was performed, and patient medications and                            allergies were reviewed. The patient's tolerance of                            previous anesthesia was also reviewed. The risks                            and benefits of the procedure and the sedation                            options and risks were discussed with the patient.                            All questions were answered, and informed consent                            was obtained. Prior Anticoagulants: The patient has                            taken no previous anticoagulant or antiplatelet                            agents except for aspirin. ASA Grade Assessment:                            III - A  patient with severe systemic disease. After                            reviewing the risks and benefits, the patient was                            deemed in satisfactory condition to undergo the                            procedure.                           After obtaining informed consent, the endoscope was                            passed under direct vision. Throughout the                             procedure, the patient's blood pressure, pulse, and                            oxygen saturations were monitored continuously. The                            [id number]-[id number] ([id number] Olympus endoscope was introduced                            through the mouth, and advanced to the second part                            of duodenum. The upper GI endoscopy was                            accomplished without difficulty. The patient                            tolerated the procedure well. Scope In: Scope Out: Findings:      The examined esophagus was normal.      The Z-line was regular and was found 42 cm from the incisors.      A few dispersed small erosions with no bleeding and no stigmata of       recent bleeding were found in the gastric fundus, in the gastric body,       on the greater curvature of the stomach and on the lesser curvature of       the stomach.      Few non-bleeding linear and superficial gastric ulcers with a clean       ulcer base (Forrest Class III) were found in the gastric body and in the       gastric antrum. Biopsies were taken with a cold forceps for Helicobacter       pylori testing.      The cardia and gastric fundus were normal on retroflexion.      Localized moderately erythematous mucosa without bleeding was found in       the gastric antrum.      One  non-bleeding cratered duodenal ulcer with a clean ulcer base       (Forrest Class III) was found in the first portion of the duodenum. The       lesion was 10 mm in largest dimension.      The duodenal bulb and second portion of the duodenum were normal. Impression:               - Normal esophagus.                           - Z-line regular, 42 cm from the incisors.                           - Erosive gastropathy with no bleeding and no                            stigmata of recent bleeding.                           - Non-bleeding gastric ulcers with a clean ulcer                            base  (Forrest Class III). Biopsied.                           - Erythematous mucosa in the antrum.                           - Non-bleeding duodenal ulcer with a clean ulcer                            base (Forrest Class III).                           - Normal duodenal bulb and second portion of the                            duodenum. Moderate Sedation:      Patient did not receive moderate sedation for this procedure, but       instead received monitored anesthesia care. Recommendation:           - Resume regular diet.                           - Continue present medications.                           - Await pathology results.                           - Use Protonix (pantoprazole) 40 mg PO BID for 2                            months. Procedure Code(s):        --- Professional ---  16109, Esophagogastroduodenoscopy, flexible,                            transoral; with biopsy, single or multiple Diagnosis Code(s):        --- Professional ---                           K31.89, Other diseases of stomach and duodenum                           K25.9, Gastric ulcer, unspecified as acute or                            chronic, without hemorrhage or perforation                           K26.9, Duodenal ulcer, unspecified as acute or                            chronic, without hemorrhage or perforation                           D50.9, Iron deficiency anemia, unspecified CPT copyright 2019 [employer] Association. All rights reserved. The codes documented in this report are preliminary and upon coder review may  be revised to meet current compliance requirements. Kerin Salen, MD 08/07/2021 8:51:50 AM This report has been signed electronically. Number of Addenda: 0

## 2021-08-07 NOTE — Plan of Care (Signed)
  Problem: Pain Managment: Goal: General experience of comfort will improve Outcome: Progressing   Problem: Safety: Goal: Ability to remain free from injury will improve Outcome: Progressing   Problem: Skin Integrity: Goal: Risk for impaired skin integrity will decrease Outcome: Progressing   Problem: Education: Goal: Knowledge of General Education information will improve Description: Including pain rating scale, medication(s)/side effects and non-pharmacologic comfort measures Outcome: Progressing   

## 2021-08-07 NOTE — Progress Notes (Signed)
with associated deformity. Similar subtalar and Chopart joint ankylosis. Osteopenia. No joint effusion. Ligaments Ligaments are suboptimally evaluated by CT. Muscles and Tendons Severely atrophy. Soft tissue Progressive deep soft tissue ulceration along the lateral and plantar aspect of the hindfoot. Prominent phlegmonous change around the ulceration without well-defined drainable fluid collection. A small amount of subcutaneous emphysema tracks superiorly into the superficial posterolateral distal lower leg. Diffuse skin thickening  and soft tissue swelling. No soft tissue mass. IMPRESSION: 1. Progressive deep soft tissue ulceration along the lateral and plantar aspect of the hindfoot with surrounding prominent phlegmonous change but without well-defined drainable fluid collection. Small amount of subcutaneous emphysema tracks superiorly into the superficial posterolateral distal lower leg, most likely associated with the patient's wound rather than necrotizing infection. 2. No CT evidence of acute osteomyelitis. 3. 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 Electronically Signed   By: Titus Dubin M.D.   On: 08/01/2021 14:06   DG Foot Complete Right  Result Date: 08/01/2021 CLINICAL DATA:  Purulent drainage from RIGHT foot wound, increased swelling and redness; told at [facility] to go to ED for RIGHT foot infection, history of chronic osteomyelitis EXAM: RIGHT FOOT COMPLETE - 3+ VIEW COMPARISON:  04/05/2021 FINDINGS: Osseous demineralization. Marked soft tissue swelling. Foci of soft tissue gas are identified at the posterior aspect of the RIGHT ankle extending into plantar aspect of foot laterally. Findings consistent with either penetrating wound or soft tissue infection by gas-forming organism. Extensive degenerative changes deformity throughout the tarsals to MTP joints. Bone destruction at proximal RIGHT fifth meta tarsal and distal lateral cuboid again seen. Nonunion of old fracture at lateral base of proximal phalanx great toe. No acute fracture or bone destruction definitely visualized. IMPRESSION: Extensive soft tissue swelling with foci of soft tissue gas at the lateral heel and posterior ankle either representing penetrating wound or infection by a gas-forming organism. Extensive degenerative changes and deformities throughout RIGHT foot similar to previous exam. No definite acute fracture or new bone destruction identified. Findings called to [facility] on 08/01/2021 at 1104  hours. Electronically Signed   By: Lavonia Dana M.D.   On: 08/01/2021 10:53   VAS [place] ABI WITH/WO TBI  Result Date: 08/01/2021  LOWER EXTREMITY DOPPLER STUDY [name]  Date of Exam:   08/01/2021 Medical Rec #: [id number]     Accession #:    [id number] Date of Birth: 05-02-1951    Patient Gender: M Patient Age:   70 years Exam Location:  [facility] Procedure:      VAS [place] ABI WITH/WO TBI Referring Phys: Cherylann Ratel --------------------------------------------------------------------------------  Indications: Ulceration. High Risk Factors: Diabetes.  Comparison Study: No prior studies. Performing Technologist: Carlos Levering RVT  Examination Guidelines: A complete evaluation includes at minimum, Doppler waveform signals and systolic blood pressure reading at the level of bilateral brachial, anterior tibial, and posterior tibial arteries, when vessel segments are accessible. Bilateral testing is considered an integral part of a complete examination. Photoelectric Plethysmograph (PPG) waveforms and toe systolic pressure readings are included as required and additional duplex testing as needed. Limited examinations for reoccurring indications may be performed as noted.  ABI Findings: +---------+------------------+-----+----------+--------+ Right    Rt Pressure (mmHg)IndexWaveform  Comment  +---------+------------------+-----+----------+--------+ Brachial 141                    triphasic          +---------+------------------+-----+----------+--------+ PTA  with associated deformity. Similar subtalar and Chopart joint ankylosis. Osteopenia. No joint effusion. Ligaments Ligaments are suboptimally evaluated by CT. Muscles and Tendons Severely atrophy. Soft tissue Progressive deep soft tissue ulceration along the lateral and plantar aspect of the hindfoot. Prominent phlegmonous change around the ulceration without well-defined drainable fluid collection. A small amount of subcutaneous emphysema tracks superiorly into the superficial posterolateral distal lower leg. Diffuse skin thickening  and soft tissue swelling. No soft tissue mass. IMPRESSION: 1. Progressive deep soft tissue ulceration along the lateral and plantar aspect of the hindfoot with surrounding prominent phlegmonous change but without well-defined drainable fluid collection. Small amount of subcutaneous emphysema tracks superiorly into the superficial posterolateral distal lower leg, most likely associated with the patient's wound rather than necrotizing infection. 2. No CT evidence of acute osteomyelitis. 3. 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 Electronically Signed   By: Titus Dubin M.D.   On: 08/01/2021 14:06   DG Foot Complete Right  Result Date: 08/01/2021 CLINICAL DATA:  Purulent drainage from RIGHT foot wound, increased swelling and redness; told at [facility] to go to ED for RIGHT foot infection, history of chronic osteomyelitis EXAM: RIGHT FOOT COMPLETE - 3+ VIEW COMPARISON:  04/05/2021 FINDINGS: Osseous demineralization. Marked soft tissue swelling. Foci of soft tissue gas are identified at the posterior aspect of the RIGHT ankle extending into plantar aspect of foot laterally. Findings consistent with either penetrating wound or soft tissue infection by gas-forming organism. Extensive degenerative changes deformity throughout the tarsals to MTP joints. Bone destruction at proximal RIGHT fifth meta tarsal and distal lateral cuboid again seen. Nonunion of old fracture at lateral base of proximal phalanx great toe. No acute fracture or bone destruction definitely visualized. IMPRESSION: Extensive soft tissue swelling with foci of soft tissue gas at the lateral heel and posterior ankle either representing penetrating wound or infection by a gas-forming organism. Extensive degenerative changes and deformities throughout RIGHT foot similar to previous exam. No definite acute fracture or new bone destruction identified. Findings called to [facility] on 08/01/2021 at 1104  hours. Electronically Signed   By: Lavonia Dana M.D.   On: 08/01/2021 10:53   VAS [place] ABI WITH/WO TBI  Result Date: 08/01/2021  LOWER EXTREMITY DOPPLER STUDY [name]  Date of Exam:   08/01/2021 Medical Rec #: [id number]     Accession #:    [id number] Date of Birth: 05-02-1951    Patient Gender: M Patient Age:   70 years Exam Location:  [facility] Procedure:      VAS [place] ABI WITH/WO TBI Referring Phys: Cherylann Ratel --------------------------------------------------------------------------------  Indications: Ulceration. High Risk Factors: Diabetes.  Comparison Study: No prior studies. Performing Technologist: Carlos Levering RVT  Examination Guidelines: A complete evaluation includes at minimum, Doppler waveform signals and systolic blood pressure reading at the level of bilateral brachial, anterior tibial, and posterior tibial arteries, when vessel segments are accessible. Bilateral testing is considered an integral part of a complete examination. Photoelectric Plethysmograph (PPG) waveforms and toe systolic pressure readings are included as required and additional duplex testing as needed. Limited examinations for reoccurring indications may be performed as noted.  ABI Findings: +---------+------------------+-----+----------+--------+ Right    Rt Pressure (mmHg)IndexWaveform  Comment  +---------+------------------+-----+----------+--------+ Brachial 141                    triphasic          +---------+------------------+-----+----------+--------+ PTA  Progress note  PAULMICHAEL SCHRECK PZW:[id number] DOB: [birth year]  PCP: [place], [place] date: 08/01/2021 Discharge date: 08/07/2021  Time spent: 35 minutes.  Recommendations for Outpatient Follow-up:  Follow-up with orthopedic surgery. Follow-up with GI. Take medications as prescribed Continue PT OT with assistance and fall precautions.  Discharge Diagnoses:  Active Hospital Problems   Diagnosis Date Noted   Diabetic foot infection ([place]) 08/01/2021   Obesity with body mass index (BMI) of 30.0 to 39.9 03/31/2021   Diabetes type 2, controlled ([place]) 01/29/2016   Complete heart block ([place]) 01/21/2015   Right foot ulcer ([place]) 01/21/2015   Essential hypertension 05/10/2011   Subacute osteomyelitis, right ankle and foot [facility])     Resolved Hospital Problems  No resolved problems to display.    Discharge Condition: Stable  Diet recommendation: Resume previous diet.  Vitals:   08/07/21 0855 08/07/21 0910  BP: 136/61 (!) 163/67  Pulse: (!) 51 (!) 51  Resp: 15 18  Temp: (!) 97.4 F (36.3 C) (!) 97.5 F (36.4 C)  SpO2: 100% 98%    History of present illness:  Nathan Miller is a 70 y.o. male with a history of CAD, diabetes mellitus, type 2, chronic lymphedema, complete heart block s/p PPM, chronic foot wound followed by wound care. Patient presented secondary to purulent drainage from right foot and found to have acute infection.  He received IV antibiotics empirically, IV vancomycin, Zosyn, IV clindamycin.  Seen by orthopedic surgery, Dr. Sharol Given.  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  Was evaluated by PT OT with recommendation for SNF.  TOC assisting with DC planning.    Hospital course complicated by acute blood loss anemia with iron deficiency.  He was transfused 1 unit PRBC and received infusion of IV Feraheme 510 mg x 1.  FOBT positive.  Seen by GI, post EGD and colonoscopy on 08/07/2021.  Transfused 1 unit PRBC for hemoglobin of 7.2  on 08/06/2021, repeated hemoglobin 8.7.  08/07/2021: Patient was seen at his bedside.  He was getting ready to go for his EGD and colonoscopy.  These revealed gastric ulcer, duodenal ulcer, diverticulosis with no perforation or abscess, polyps, hemorrhoids.  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 Sharol Given. CTA abdomen obtained by vascular surgery significant for right superficial femoral stenosis of high degree measuring 60% in addition to multifocal high-grade stenosis of left superficial femoral artery. Right transtibial amputation on 08/03/2021. Received IV clindamycin, zosyn, and IV vancomycin perioperatively Is currently off antibiotics, afebrile with no leukocytosis.   Acute blood loss anemia postsurgery in the setting of chronic iron deficiency anemia, baseline hemoglobin 12 Hemoglobin downtrending, despite blood transfusion, 1 unit on 08/05/2021.  FOBT positive on 08/06/2021. 1 unit PRBC transfuse for hemoglobin of 7.2. He has received total of 2 units PRBC transfusion during this admission. Significant iron deficiency on iron studies done on 08/03/2021 He is on iron supplement every other day at home, resume. Received 1 dose of Feraheme on 08/05/2021. Post EGD and colonoscopy.  Gastric ulcer/duodenal ulcer, seen on EGD, done on 08/07/2021, by [facility]. Therisa Doyne. GI  Progress note  PAULMICHAEL SCHRECK PZW:[id number] DOB: [birth year]  PCP: [place], [place] date: 08/01/2021 Discharge date: 08/07/2021  Time spent: 35 minutes.  Recommendations for Outpatient Follow-up:  Follow-up with orthopedic surgery. Follow-up with GI. Take medications as prescribed Continue PT OT with assistance and fall precautions.  Discharge Diagnoses:  Active Hospital Problems   Diagnosis Date Noted   Diabetic foot infection ([place]) 08/01/2021   Obesity with body mass index (BMI) of 30.0 to 39.9 03/31/2021   Diabetes type 2, controlled ([place]) 01/29/2016   Complete heart block ([place]) 01/21/2015   Right foot ulcer ([place]) 01/21/2015   Essential hypertension 05/10/2011   Subacute osteomyelitis, right ankle and foot [facility])     Resolved Hospital Problems  No resolved problems to display.    Discharge Condition: Stable  Diet recommendation: Resume previous diet.  Vitals:   08/07/21 0855 08/07/21 0910  BP: 136/61 (!) 163/67  Pulse: (!) 51 (!) 51  Resp: 15 18  Temp: (!) 97.4 F (36.3 C) (!) 97.5 F (36.4 C)  SpO2: 100% 98%    History of present illness:  Nathan Miller is a 70 y.o. male with a history of CAD, diabetes mellitus, type 2, chronic lymphedema, complete heart block s/p PPM, chronic foot wound followed by wound care. Patient presented secondary to purulent drainage from right foot and found to have acute infection.  He received IV antibiotics empirically, IV vancomycin, Zosyn, IV clindamycin.  Seen by orthopedic surgery, Dr. Sharol Given.  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  Was evaluated by PT OT with recommendation for SNF.  TOC assisting with DC planning.    Hospital course complicated by acute blood loss anemia with iron deficiency.  He was transfused 1 unit PRBC and received infusion of IV Feraheme 510 mg x 1.  FOBT positive.  Seen by GI, post EGD and colonoscopy on 08/07/2021.  Transfused 1 unit PRBC for hemoglobin of 7.2  on 08/06/2021, repeated hemoglobin 8.7.  08/07/2021: Patient was seen at his bedside.  He was getting ready to go for his EGD and colonoscopy.  These revealed gastric ulcer, duodenal ulcer, diverticulosis with no perforation or abscess, polyps, hemorrhoids.  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 Sharol Given. CTA abdomen obtained by vascular surgery significant for right superficial femoral stenosis of high degree measuring 60% in addition to multifocal high-grade stenosis of left superficial femoral artery. Right transtibial amputation on 08/03/2021. Received IV clindamycin, zosyn, and IV vancomycin perioperatively Is currently off antibiotics, afebrile with no leukocytosis.   Acute blood loss anemia postsurgery in the setting of chronic iron deficiency anemia, baseline hemoglobin 12 Hemoglobin downtrending, despite blood transfusion, 1 unit on 08/05/2021.  FOBT positive on 08/06/2021. 1 unit PRBC transfuse for hemoglobin of 7.2. He has received total of 2 units PRBC transfusion during this admission. Significant iron deficiency on iron studies done on 08/03/2021 He is on iron supplement every other day at home, resume. Received 1 dose of Feraheme on 08/05/2021. Post EGD and colonoscopy.  Gastric ulcer/duodenal ulcer, seen on EGD, done on 08/07/2021, by [facility]. Therisa Doyne. GI  Progress note  PAULMICHAEL SCHRECK PZW:[id number] DOB: [birth year]  PCP: [place], [place] date: 08/01/2021 Discharge date: 08/07/2021  Time spent: 35 minutes.  Recommendations for Outpatient Follow-up:  Follow-up with orthopedic surgery. Follow-up with GI. Take medications as prescribed Continue PT OT with assistance and fall precautions.  Discharge Diagnoses:  Active Hospital Problems   Diagnosis Date Noted   Diabetic foot infection ([place]) 08/01/2021   Obesity with body mass index (BMI) of 30.0 to 39.9 03/31/2021   Diabetes type 2, controlled ([place]) 01/29/2016   Complete heart block ([place]) 01/21/2015   Right foot ulcer ([place]) 01/21/2015   Essential hypertension 05/10/2011   Subacute osteomyelitis, right ankle and foot [facility])     Resolved Hospital Problems  No resolved problems to display.    Discharge Condition: Stable  Diet recommendation: Resume previous diet.  Vitals:   08/07/21 0855 08/07/21 0910  BP: 136/61 (!) 163/67  Pulse: (!) 51 (!) 51  Resp: 15 18  Temp: (!) 97.4 F (36.3 C) (!) 97.5 F (36.4 C)  SpO2: 100% 98%    History of present illness:  Nathan Miller is a 70 y.o. male with a history of CAD, diabetes mellitus, type 2, chronic lymphedema, complete heart block s/p PPM, chronic foot wound followed by wound care. Patient presented secondary to purulent drainage from right foot and found to have acute infection.  He received IV antibiotics empirically, IV vancomycin, Zosyn, IV clindamycin.  Seen by orthopedic surgery, Dr. Sharol Given.  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  Was evaluated by PT OT with recommendation for SNF.  TOC assisting with DC planning.    Hospital course complicated by acute blood loss anemia with iron deficiency.  He was transfused 1 unit PRBC and received infusion of IV Feraheme 510 mg x 1.  FOBT positive.  Seen by GI, post EGD and colonoscopy on 08/07/2021.  Transfused 1 unit PRBC for hemoglobin of 7.2  on 08/06/2021, repeated hemoglobin 8.7.  08/07/2021: Patient was seen at his bedside.  He was getting ready to go for his EGD and colonoscopy.  These revealed gastric ulcer, duodenal ulcer, diverticulosis with no perforation or abscess, polyps, hemorrhoids.  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 Sharol Given. CTA abdomen obtained by vascular surgery significant for right superficial femoral stenosis of high degree measuring 60% in addition to multifocal high-grade stenosis of left superficial femoral artery. Right transtibial amputation on 08/03/2021. Received IV clindamycin, zosyn, and IV vancomycin perioperatively Is currently off antibiotics, afebrile with no leukocytosis.   Acute blood loss anemia postsurgery in the setting of chronic iron deficiency anemia, baseline hemoglobin 12 Hemoglobin downtrending, despite blood transfusion, 1 unit on 08/05/2021.  FOBT positive on 08/06/2021. 1 unit PRBC transfuse for hemoglobin of 7.2. He has received total of 2 units PRBC transfusion during this admission. Significant iron deficiency on iron studies done on 08/03/2021 He is on iron supplement every other day at home, resume. Received 1 dose of Feraheme on 08/05/2021. Post EGD and colonoscopy.  Gastric ulcer/duodenal ulcer, seen on EGD, done on 08/07/2021, by [facility]. Therisa Doyne. GI  with associated deformity. Similar subtalar and Chopart joint ankylosis. Osteopenia. No joint effusion. Ligaments Ligaments are suboptimally evaluated by CT. Muscles and Tendons Severely atrophy. Soft tissue Progressive deep soft tissue ulceration along the lateral and plantar aspect of the hindfoot. Prominent phlegmonous change around the ulceration without well-defined drainable fluid collection. A small amount of subcutaneous emphysema tracks superiorly into the superficial posterolateral distal lower leg. Diffuse skin thickening  and soft tissue swelling. No soft tissue mass. IMPRESSION: 1. Progressive deep soft tissue ulceration along the lateral and plantar aspect of the hindfoot with surrounding prominent phlegmonous change but without well-defined drainable fluid collection. Small amount of subcutaneous emphysema tracks superiorly into the superficial posterolateral distal lower leg, most likely associated with the patient's wound rather than necrotizing infection. 2. No CT evidence of acute osteomyelitis. 3. 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 Electronically Signed   By: Titus Dubin M.D.   On: 08/01/2021 14:06   DG Foot Complete Right  Result Date: 08/01/2021 CLINICAL DATA:  Purulent drainage from RIGHT foot wound, increased swelling and redness; told at [facility] to go to ED for RIGHT foot infection, history of chronic osteomyelitis EXAM: RIGHT FOOT COMPLETE - 3+ VIEW COMPARISON:  04/05/2021 FINDINGS: Osseous demineralization. Marked soft tissue swelling. Foci of soft tissue gas are identified at the posterior aspect of the RIGHT ankle extending into plantar aspect of foot laterally. Findings consistent with either penetrating wound or soft tissue infection by gas-forming organism. Extensive degenerative changes deformity throughout the tarsals to MTP joints. Bone destruction at proximal RIGHT fifth meta tarsal and distal lateral cuboid again seen. Nonunion of old fracture at lateral base of proximal phalanx great toe. No acute fracture or bone destruction definitely visualized. IMPRESSION: Extensive soft tissue swelling with foci of soft tissue gas at the lateral heel and posterior ankle either representing penetrating wound or infection by a gas-forming organism. Extensive degenerative changes and deformities throughout RIGHT foot similar to previous exam. No definite acute fracture or new bone destruction identified. Findings called to [facility] on 08/01/2021 at 1104  hours. Electronically Signed   By: Lavonia Dana M.D.   On: 08/01/2021 10:53   VAS [place] ABI WITH/WO TBI  Result Date: 08/01/2021  LOWER EXTREMITY DOPPLER STUDY [name]  Date of Exam:   08/01/2021 Medical Rec #: [id number]     Accession #:    [id number] Date of Birth: 05-02-1951    Patient Gender: M Patient Age:   70 years Exam Location:  [facility] Procedure:      VAS [place] ABI WITH/WO TBI Referring Phys: Cherylann Ratel --------------------------------------------------------------------------------  Indications: Ulceration. High Risk Factors: Diabetes.  Comparison Study: No prior studies. Performing Technologist: Carlos Levering RVT  Examination Guidelines: A complete evaluation includes at minimum, Doppler waveform signals and systolic blood pressure reading at the level of bilateral brachial, anterior tibial, and posterior tibial arteries, when vessel segments are accessible. Bilateral testing is considered an integral part of a complete examination. Photoelectric Plethysmograph (PPG) waveforms and toe systolic pressure readings are included as required and additional duplex testing as needed. Limited examinations for reoccurring indications may be performed as noted.  ABI Findings: +---------+------------------+-----+----------+--------+ Right    Rt Pressure (mmHg)IndexWaveform  Comment  +---------+------------------+-----+----------+--------+ Brachial 141                    triphasic          +---------+------------------+-----+----------+--------+ PTA  Progress note  PAULMICHAEL SCHRECK PZW:[id number] DOB: [birth year]  PCP: [place], [place] date: 08/01/2021 Discharge date: 08/07/2021  Time spent: 35 minutes.  Recommendations for Outpatient Follow-up:  Follow-up with orthopedic surgery. Follow-up with GI. Take medications as prescribed Continue PT OT with assistance and fall precautions.  Discharge Diagnoses:  Active Hospital Problems   Diagnosis Date Noted   Diabetic foot infection ([place]) 08/01/2021   Obesity with body mass index (BMI) of 30.0 to 39.9 03/31/2021   Diabetes type 2, controlled ([place]) 01/29/2016   Complete heart block ([place]) 01/21/2015   Right foot ulcer ([place]) 01/21/2015   Essential hypertension 05/10/2011   Subacute osteomyelitis, right ankle and foot [facility])     Resolved Hospital Problems  No resolved problems to display.    Discharge Condition: Stable  Diet recommendation: Resume previous diet.  Vitals:   08/07/21 0855 08/07/21 0910  BP: 136/61 (!) 163/67  Pulse: (!) 51 (!) 51  Resp: 15 18  Temp: (!) 97.4 F (36.3 C) (!) 97.5 F (36.4 C)  SpO2: 100% 98%    History of present illness:  Nathan Miller is a 70 y.o. male with a history of CAD, diabetes mellitus, type 2, chronic lymphedema, complete heart block s/p PPM, chronic foot wound followed by wound care. Patient presented secondary to purulent drainage from right foot and found to have acute infection.  He received IV antibiotics empirically, IV vancomycin, Zosyn, IV clindamycin.  Seen by orthopedic surgery, Dr. Sharol Given.  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  Was evaluated by PT OT with recommendation for SNF.  TOC assisting with DC planning.    Hospital course complicated by acute blood loss anemia with iron deficiency.  He was transfused 1 unit PRBC and received infusion of IV Feraheme 510 mg x 1.  FOBT positive.  Seen by GI, post EGD and colonoscopy on 08/07/2021.  Transfused 1 unit PRBC for hemoglobin of 7.2  on 08/06/2021, repeated hemoglobin 8.7.  08/07/2021: Patient was seen at his bedside.  He was getting ready to go for his EGD and colonoscopy.  These revealed gastric ulcer, duodenal ulcer, diverticulosis with no perforation or abscess, polyps, hemorrhoids.  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 Sharol Given. CTA abdomen obtained by vascular surgery significant for right superficial femoral stenosis of high degree measuring 60% in addition to multifocal high-grade stenosis of left superficial femoral artery. Right transtibial amputation on 08/03/2021. Received IV clindamycin, zosyn, and IV vancomycin perioperatively Is currently off antibiotics, afebrile with no leukocytosis.   Acute blood loss anemia postsurgery in the setting of chronic iron deficiency anemia, baseline hemoglobin 12 Hemoglobin downtrending, despite blood transfusion, 1 unit on 08/05/2021.  FOBT positive on 08/06/2021. 1 unit PRBC transfuse for hemoglobin of 7.2. He has received total of 2 units PRBC transfusion during this admission. Significant iron deficiency on iron studies done on 08/03/2021 He is on iron supplement every other day at home, resume. Received 1 dose of Feraheme on 08/05/2021. Post EGD and colonoscopy.  Gastric ulcer/duodenal ulcer, seen on EGD, done on 08/07/2021, by [facility]. Therisa Doyne. GI  Progress note  PAULMICHAEL SCHRECK PZW:[id number] DOB: [birth year]  PCP: [place], [place] date: 08/01/2021 Discharge date: 08/07/2021  Time spent: 35 minutes.  Recommendations for Outpatient Follow-up:  Follow-up with orthopedic surgery. Follow-up with GI. Take medications as prescribed Continue PT OT with assistance and fall precautions.  Discharge Diagnoses:  Active Hospital Problems   Diagnosis Date Noted   Diabetic foot infection ([place]) 08/01/2021   Obesity with body mass index (BMI) of 30.0 to 39.9 03/31/2021   Diabetes type 2, controlled ([place]) 01/29/2016   Complete heart block ([place]) 01/21/2015   Right foot ulcer ([place]) 01/21/2015   Essential hypertension 05/10/2011   Subacute osteomyelitis, right ankle and foot [facility])     Resolved Hospital Problems  No resolved problems to display.    Discharge Condition: Stable  Diet recommendation: Resume previous diet.  Vitals:   08/07/21 0855 08/07/21 0910  BP: 136/61 (!) 163/67  Pulse: (!) 51 (!) 51  Resp: 15 18  Temp: (!) 97.4 F (36.3 C) (!) 97.5 F (36.4 C)  SpO2: 100% 98%    History of present illness:  Nathan Miller is a 70 y.o. male with a history of CAD, diabetes mellitus, type 2, chronic lymphedema, complete heart block s/p PPM, chronic foot wound followed by wound care. Patient presented secondary to purulent drainage from right foot and found to have acute infection.  He received IV antibiotics empirically, IV vancomycin, Zosyn, IV clindamycin.  Seen by orthopedic surgery, Dr. Sharol Given.  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  Was evaluated by PT OT with recommendation for SNF.  TOC assisting with DC planning.    Hospital course complicated by acute blood loss anemia with iron deficiency.  He was transfused 1 unit PRBC and received infusion of IV Feraheme 510 mg x 1.  FOBT positive.  Seen by GI, post EGD and colonoscopy on 08/07/2021.  Transfused 1 unit PRBC for hemoglobin of 7.2  on 08/06/2021, repeated hemoglobin 8.7.  08/07/2021: Patient was seen at his bedside.  He was getting ready to go for his EGD and colonoscopy.  These revealed gastric ulcer, duodenal ulcer, diverticulosis with no perforation or abscess, polyps, hemorrhoids.  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 Sharol Given. CTA abdomen obtained by vascular surgery significant for right superficial femoral stenosis of high degree measuring 60% in addition to multifocal high-grade stenosis of left superficial femoral artery. Right transtibial amputation on 08/03/2021. Received IV clindamycin, zosyn, and IV vancomycin perioperatively Is currently off antibiotics, afebrile with no leukocytosis.   Acute blood loss anemia postsurgery in the setting of chronic iron deficiency anemia, baseline hemoglobin 12 Hemoglobin downtrending, despite blood transfusion, 1 unit on 08/05/2021.  FOBT positive on 08/06/2021. 1 unit PRBC transfuse for hemoglobin of 7.2. He has received total of 2 units PRBC transfusion during this admission. Significant iron deficiency on iron studies done on 08/03/2021 He is on iron supplement every other day at home, resume. Received 1 dose of Feraheme on 08/05/2021. Post EGD and colonoscopy.  Gastric ulcer/duodenal ulcer, seen on EGD, done on 08/07/2021, by [facility]. Therisa Doyne. GI  Progress note  PAULMICHAEL SCHRECK PZW:[id number] DOB: [birth year]  PCP: [place], [place] date: 08/01/2021 Discharge date: 08/07/2021  Time spent: 35 minutes.  Recommendations for Outpatient Follow-up:  Follow-up with orthopedic surgery. Follow-up with GI. Take medications as prescribed Continue PT OT with assistance and fall precautions.  Discharge Diagnoses:  Active Hospital Problems   Diagnosis Date Noted   Diabetic foot infection ([place]) 08/01/2021   Obesity with body mass index (BMI) of 30.0 to 39.9 03/31/2021   Diabetes type 2, controlled ([place]) 01/29/2016   Complete heart block ([place]) 01/21/2015   Right foot ulcer ([place]) 01/21/2015   Essential hypertension 05/10/2011   Subacute osteomyelitis, right ankle and foot [facility])     Resolved Hospital Problems  No resolved problems to display.    Discharge Condition: Stable  Diet recommendation: Resume previous diet.  Vitals:   08/07/21 0855 08/07/21 0910  BP: 136/61 (!) 163/67  Pulse: (!) 51 (!) 51  Resp: 15 18  Temp: (!) 97.4 F (36.3 C) (!) 97.5 F (36.4 C)  SpO2: 100% 98%    History of present illness:  Nathan Miller is a 70 y.o. male with a history of CAD, diabetes mellitus, type 2, chronic lymphedema, complete heart block s/p PPM, chronic foot wound followed by wound care. Patient presented secondary to purulent drainage from right foot and found to have acute infection.  He received IV antibiotics empirically, IV vancomycin, Zosyn, IV clindamycin.  Seen by orthopedic surgery, Dr. Sharol Given.  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  Was evaluated by PT OT with recommendation for SNF.  TOC assisting with DC planning.    Hospital course complicated by acute blood loss anemia with iron deficiency.  He was transfused 1 unit PRBC and received infusion of IV Feraheme 510 mg x 1.  FOBT positive.  Seen by GI, post EGD and colonoscopy on 08/07/2021.  Transfused 1 unit PRBC for hemoglobin of 7.2  on 08/06/2021, repeated hemoglobin 8.7.  08/07/2021: Patient was seen at his bedside.  He was getting ready to go for his EGD and colonoscopy.  These revealed gastric ulcer, duodenal ulcer, diverticulosis with no perforation or abscess, polyps, hemorrhoids.  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 Sharol Given. CTA abdomen obtained by vascular surgery significant for right superficial femoral stenosis of high degree measuring 60% in addition to multifocal high-grade stenosis of left superficial femoral artery. Right transtibial amputation on 08/03/2021. Received IV clindamycin, zosyn, and IV vancomycin perioperatively Is currently off antibiotics, afebrile with no leukocytosis.   Acute blood loss anemia postsurgery in the setting of chronic iron deficiency anemia, baseline hemoglobin 12 Hemoglobin downtrending, despite blood transfusion, 1 unit on 08/05/2021.  FOBT positive on 08/06/2021. 1 unit PRBC transfuse for hemoglobin of 7.2. He has received total of 2 units PRBC transfusion during this admission. Significant iron deficiency on iron studies done on 08/03/2021 He is on iron supplement every other day at home, resume. Received 1 dose of Feraheme on 08/05/2021. Post EGD and colonoscopy.  Gastric ulcer/duodenal ulcer, seen on EGD, done on 08/07/2021, by [facility]. Therisa Doyne. GI

## 2021-08-08 LAB — GLUCOSE, CAPILLARY
Glucose-Capillary: 115 mg/dL — ABNORMAL HIGH (ref 70–99)
Glucose-Capillary: 159 mg/dL — ABNORMAL HIGH (ref 70–99)
Glucose-Capillary: 100 mg/dL — ABNORMAL HIGH (ref 70–99)
Glucose-Capillary: 104 mg/dL — ABNORMAL HIGH (ref 70–99)

## 2021-08-08 LAB — CREATININE, SERUM
Creatinine, Ser: 0.85 mg/dL (ref 0.61–1.24)
GFR, Estimated: >60 mL/min (ref >60)

## 2021-08-08 LAB — RESP PANEL BY RT-PCR (FLU A&B, COVID) ARPGX2
Influenza A by PCR: NEGATIVE
Influenza B by PCR: NEGATIVE
SARS Coronavirus 2 by RT PCR: NEGATIVE

## 2021-08-08 LAB — SURGICAL PATHOLOGY

## 2021-08-08 MED ORDER — FERROUS SULFATE 325 (65 FE) MG PO TABS: 325.0000 mg | ORAL_TABLET | Freq: Every day | ORAL | 0 refills | Status: DC

## 2021-08-08 MED ORDER — POLYETHYLENE GLYCOL 3350 17 G PO PACK: 17.0000 g | PACK | Freq: Every day | ORAL | 0 refills | Status: DC | PRN

## 2021-08-08 NOTE — Progress Notes (Signed)
Patient ID: Nathan Miller, male   DOB: 05-29-1951, 70 y.o.   MRN: [id number] Patient is postoperative day 5 transtibial amputation.  There is no drainage in the wound VAC canister.  Anticipate discharge to skilled nursing.  Will have the wound VAC dressing removed prior to discharge.

## 2021-08-08 NOTE — Progress Notes (Signed)
Occupational Therapy Treatment Patient Details Name: Nathan Miller MRN: [id number] DOB: 1951/01/29 Today's Date: 08/08/2021   History of present illness 70 y.o. male admitted 08/01/21 with chronic R foot wound; workup for R foot ascess osteomyelitis ulceration. 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 PMH includes CAD, DM2, lymphedema, pacemaker, HTN, CHF, DM, osteomyelitis.   OT comments  Pt progressing towards established OT goals and continues to present with high motivation to participate in therapy despite pain. Providing pt with education on lateral scoot technique for transfers. Pt performing lateral scoot to drop arm recliner with Min A +2. Continue to recommend dc to SNF and will continue to follow acutely as admitted.   Recommendations for follow up therapy are one component of a multi-disciplinary discharge planning process, led by the attending physician.  Recommendations may be updated based on patient status, additional functional criteria and insurance authorization.    Follow Up Recommendations  SNF;Supervision/Assistance - 24 hour    Equipment Recommendations  Other (comment) (Defer to next venue)    Recommendations for Other Services PT consult    Precautions / Restrictions Precautions Precautions: Fall;Other (comment) Precaution Comments: wound vac Required Braces or Orthoses: Other Brace Other Brace: Rt BKA limb guard Restrictions Weight Bearing Restrictions: Yes RLE Weight Bearing: Non weight bearing       Mobility Bed Mobility Overal bed mobility: Needs Assistance Bed Mobility: Supine to Sit     Supine to sit: Supervision;HOB elevated     General bed mobility comments: Increased time and effort, reliant on use of bed rail, supervision for safety/lines    Transfers Overall transfer level: Needs assistance Equipment used: None Transfers: Lateral/Scoot Transfers          Lateral/Scoot Transfers: Min assist;+2 physical assistance;+2  safety/equipment General transfer comment: Able to perform lateral scoot towards right with cues for technique and assist with bottom using pads to get over hump of arm rest. 2 rest breaks needed due to SOB. Sp02 dropped to 85% on 2L/min 02 .    Balance Overall balance assessment: Needs assistance Sitting-balance support: No upper extremity supported;Feet supported Sitting balance-Leahy Scale: Fair                                     ADL either performed or assessed with clinical judgement   ADL Overall ADL's : Needs assistance/impaired                     Lower Body Dressing: Maximal assistance;Sit to/from stand Lower Body Dressing Details (indicate cue type and reason): don limb gaurd and L sock Toilet Transfer: Minimal assistance;+2 for safety/equipment;Transfer board (simualted to drop arm recliner) Toilet Transfer Details (indicate cue type and reason): Min A for bringing hips over edge of bed to recliner         Functional mobility during ADLs: Minimal assistance;+2 for safety/equipment (lateral scoot) General ADL Comments: Providing pt with eduation on lateral scoot technique     Vision       Perception     Praxis      Cognition Arousal/Alertness: Awake/alert Behavior During Therapy: WFL for tasks assessed/performed Overall Cognitive Status: Within Functional Limits for tasks assessed                                          Exercises Amputee  Exercises Quad Sets: Strengthening;Right;5 reps;Supine Hip ABduction/ADduction: Strengthening;5 reps;Right;Supine Hip Flexion/Marching: AROM;Right;5 reps;Supine Knee Extension: Strengthening;Right;5 reps;Supine Straight Leg Raises: Strengthening;Right;5 reps;Supine   Shoulder Instructions       General Comments Sp02 dropped to 85% on 2L/m,in 02 [place] with exertion with 2-3/4 DOE.    Pertinent Vitals/ Pain       Pain Assessment: Faces Faces Pain Scale: Hurts a little bit Pain  Location: R residual limb Pain Descriptors / Indicators: Discomfort Pain Intervention(s): Monitored during session;Limited activity within patient's tolerance;Repositioned  Home Living                                          Prior Functioning/Environment              Frequency  Min 2X/week        Progress Toward Goals  OT Goals(current goals can now be found in the care plan section)  Progress towards OT goals: Progressing toward goals  Acute Rehab OT Goals Patient Stated Goal: Post-acute rehab at SNF near his home in [place] OT Goal Formulation: With patient Time For Goal Achievement: 08/18/21 Potential to Achieve Goals: Good ADL Goals Pt Will Perform Lower Body Dressing: with min guard assist;sitting/lateral leans;bed level Pt Will Transfer to Toilet: with min assist;stand pivot transfer;bedside commode;with transfer board Pt Will Perform Toileting - Clothing Manipulation and hygiene: with min guard assist;sitting/lateral leans Additional ADL Goal #1: P will perform bed mobility at Mod I level in preparation for ADLs  Plan Discharge plan remains appropriate    Co-evaluation    PT/OT/SLP Co-Evaluation/Treatment: Yes Reason for Co-Treatment: To address functional/ADL transfers PT goals addressed during session: Mobility/safety with mobility;Balance;Strengthening/ROM OT goals addressed during session: ADL's and self-care      AM-PAC OT "6 Clicks" Daily Activity     Outcome Measure   Help from another person eating meals?: None Help from another person taking care of personal grooming?: A Little Help from another person toileting, which includes using toliet, bedpan, or urinal?: A Lot Help from another person bathing (including washing, rinsing, drying)?: A Lot Help from another person to put on and taking off regular upper body clothing?: A Little Help from another person to put on and taking off regular lower body clothing?: A Lot 6 Click  Score: 16    End of Session Equipment Utilized During Treatment: Gait belt;Rolling walker  OT Visit Diagnosis: Unsteadiness on feet (R26.81);Other abnormalities of gait and mobility (R26.89);Muscle weakness (generalized) (M62.81);Pain Pain - Right/Left: Right Pain - part of body: Leg   Activity Tolerance Patient tolerated treatment well   Patient Left in chair;with call bell/phone within reach;with chair alarm set   Nurse Communication Mobility status        Time: 1610-9604 OT Time Calculation (min): 23 min  Charges: OT General Charges $OT Visit: 1 Visit OT Treatments $Self Care/Home Management : 8-22 mins  Lorita Forinash MSOT, OTR/L Acute Rehab Pager: [phone] Office: [phone]  Theodoro Grist Talasia Saulter 08/08/2021, 2:09 PM

## 2021-08-08 NOTE — Progress Notes (Signed)
Physical Therapy Treatment Patient Details Name: Nathan Miller MRN: [id number] DOB: 07-15-1951 Today's Date: 08/08/2021   History of Present Illness 70 y.o. male admitted 08/01/21 with chronic R foot wound; workup for R foot ascess osteomyelitis ulceration. 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 PMH includes CAD, DM2, lymphedema, pacemaker, HTN, CHF, DM, osteomyelitis.    PT Comments    Patient progressing well towards PT goals. Reports no pain today. Worked on lateral scoot transfer to chair towards right side with cues for technique and assist with pad. Requires 2 rest breaks due to SOB. Sp02 dropped to 85% on 2L/min 02 [place] with activity with 2-3/4 DOE. Tolerated there ex of residual limb without difficulty. Plans to d/c to SNF tomorrow. Will follow.   Recommendations for follow up therapy are one component of a multi-disciplinary discharge planning process, led by the attending physician.  Recommendations may be updated based on patient status, additional functional criteria and insurance authorization.  Follow Up Recommendations  SNF;Supervision for mobility/OOB     Equipment Recommendations  None recommended by PT    Recommendations for Other Services       Precautions / Restrictions Precautions Precautions: Fall;Other (comment) Precaution Comments: wound vac Required Braces or Orthoses: Other Brace Other Brace: Rt BKA limb guard Restrictions Weight Bearing Restrictions: Yes RLE Weight Bearing: Non weight bearing     Mobility  Bed Mobility Overal bed mobility: Needs Assistance Bed Mobility: Supine to Sit     Supine to sit: Supervision;HOB elevated     General bed mobility comments: Increased time and effort, reliant on use of bed rail, supervision for safety/lines    Transfers Overall transfer level: Needs assistance Equipment used: None Transfers: Lateral/Scoot Transfers          Lateral/Scoot Transfers: Min assist;+2 physical assistance;+2  safety/equipment General transfer comment: Able to perform lateral scoot towards right with cues for technique and assist with bottom using pads to get over hump of arm rest. 2 rest breaks needed due to SOB. Sp02 dropped to 85% on 2L/min 02 [place].  Ambulation/Gait                 Stairs             Wheelchair Mobility    Modified Rankin (Stroke Patients Only)       Balance Overall balance assessment: Needs assistance Sitting-balance support: No upper extremity supported;Feet supported Sitting balance-Leahy Scale: Fair                                      Cognition Arousal/Alertness: Awake/alert Behavior During Therapy: WFL for tasks assessed/performed Overall Cognitive Status: Within Functional Limits for tasks assessed                                        Exercises Amputee Exercises Quad Sets: Strengthening;Right;5 reps;Supine Hip ABduction/ADduction: Strengthening;5 reps;Right;Supine Hip Flexion/Marching: AROM;Right;5 reps;Supine Knee Extension: Strengthening;Right;5 reps;Supine Straight Leg Raises: Strengthening;Right;5 reps;Supine    General Comments General comments (skin integrity, edema, etc.): Sp02 dropped to 85% on 2L/m,in 02 [place] with exertion with 2-3/4 DOE.      Pertinent Vitals/Pain Pain Assessment: No/denies pain    Home Living                      Prior Function  PT Goals (current goals can now be found in the care plan section) Progress towards PT goals: Progressing toward goals    Frequency    Min 3X/week      PT Plan Current plan remains appropriate    Co-evaluation PT/OT/SLP Co-Evaluation/Treatment: Yes Reason for Co-Treatment: For patient/therapist safety;To address functional/ADL transfers PT goals addressed during session: Mobility/safety with mobility;Balance;Strengthening/ROM        AM-PAC PT "6 Clicks" Mobility   Outcome Measure  Help needed turning from your  back to your side while in a flat bed without using bedrails?: A Little Help needed moving from lying on your back to sitting on the side of a flat bed without using bedrails?: A Little Help needed moving to and from a bed to a chair (including a wheelchair)?: A Lot Help needed standing up from a chair using your arms (e.g., wheelchair or bedside chair)?: Total Help needed to walk in hospital room?: Total Help needed climbing 3-5 steps with a railing? : Total 6 Click Score: 11    End of Session   Activity Tolerance: Patient tolerated treatment well Patient left: in chair;with call bell/phone within reach;with chair alarm set Nurse Communication: Mobility status PT Visit Diagnosis: Other abnormalities of gait and mobility (R26.89);Muscle weakness (generalized) (M62.81)     Time: 4696-2952 PT Time Calculation (min) (ACUTE ONLY): 23 min  Charges:  $Therapeutic Activity: 8-22 mins                     Vale Haven, PT, DPT Acute Rehabilitation Services Pager [phone] Office [phone]      Blake Divine A Nuala Chiles 08/08/2021, 1:10 PM

## 2021-08-08 NOTE — Discharge Summary (Addendum)
with active ulcer and abscess again seen. 2. Extensive distal colonic diverticulosis without evidence of acute diverticulitis. Electronically Signed   By: Miachel Roux M.D.   On: 08/02/2021 09:11   CT FOOT RIGHT W CONTRAST  Result Date: 08/01/2021 CLINICAL DATA:  Right foot infection. EXAM: CT OF THE LOWER RIGHT EXTREMITY WITH CONTRAST TECHNIQUE: Multidetector CT imaging of the lower right extremity was performed according to the standard protocol following intravenous contrast administration. CONTRAST:  40mL OMNIPAQUE IOHEXOL 350 MG/ML SOLN COMPARISON:  Right foot  x-rays from same day. CT right foot dated Mar 23, 2017. FINDINGS: Bones/Joint/Cartilage No bony destruction. Unchanged chronic periosteal reaction of the distal tibia and fibula. 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 Similar subtalar and Chopart joint ankylosis. Osteopenia. No joint effusion. Ligaments Ligaments are suboptimally evaluated by CT. Muscles and Tendons Severely atrophy. Soft tissue Progressive deep soft tissue ulceration along the lateral and plantar aspect of the hindfoot. Prominent phlegmonous change around the ulceration without well-defined drainable fluid collection. A small amount of subcutaneous emphysema tracks superiorly into the superficial posterolateral distal lower leg. Diffuse skin thickening and soft tissue swelling. No soft tissue mass. IMPRESSION: 1. Progressive deep soft tissue ulceration along the lateral and plantar aspect of the hindfoot with surrounding prominent phlegmonous change but without well-defined drainable fluid collection. Small amount of subcutaneous emphysema tracks superiorly into the superficial posterolateral distal lower leg, most likely associated with the patient's wound rather than necrotizing infection. 2. No CT evidence of acute osteomyelitis. 3. 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 Electronically Signed   By: Titus Dubin M.D.   On: 08/01/2021 14:06   DG Foot Complete Right  Result Date: 08/01/2021 CLINICAL DATA:  Purulent drainage from RIGHT foot wound, increased swelling and redness; told at [facility] to go to ED for RIGHT foot infection, history of chronic osteomyelitis EXAM: RIGHT FOOT COMPLETE - 3+ VIEW COMPARISON:  04/05/2021 FINDINGS: Osseous demineralization. Marked soft tissue swelling. Foci of soft tissue gas are identified at the posterior aspect of the RIGHT ankle extending into plantar aspect of foot laterally. Findings  consistent with either penetrating wound or soft tissue infection by gas-forming organism. Extensive degenerative changes deformity throughout the tarsals to MTP joints. Bone destruction at proximal RIGHT fifth meta tarsal and distal lateral cuboid again seen. Nonunion of old fracture at lateral base of proximal phalanx great toe. No acute fracture or bone destruction definitely visualized. IMPRESSION: Extensive soft tissue swelling with foci of soft tissue gas at the lateral heel and posterior ankle either representing penetrating wound or infection by a gas-forming organism. Extensive degenerative changes and deformities throughout RIGHT foot similar to previous exam. No definite acute fracture or new bone destruction identified. Findings called to [facility] on 08/01/2021 at 1104 hours. Electronically Signed   By: Lavonia Dana M.D.   On: 08/01/2021 10:53   VAS [place] ABI WITH/WO TBI  Result Date: 08/01/2021  LOWER EXTREMITY DOPPLER STUDY [name]  Date of Exam:   08/01/2021 Medical Rec #: [id number]     Accession #:    [id number] Date of Birth: 02-16-1951    Patient Gender: M Patient Age:   76 years Exam Location:  [facility] Procedure:      VAS [place] ABI WITH/WO TBI Referring Phys: Cherylann Ratel --------------------------------------------------------------------------------  Indications: Ulceration. High Risk Factors: Diabetes.  Comparison Study: No prior studies. Performing Technologist: Carlos Levering RVT  Examination  Progress note  WOODS GANGEMI XTK:[id number] DOB: 06/11/1951  PCP: [place], [place] date: 08/01/2021 Discharge date: 08/08/2021  Time spent: 35 minutes.  Recommendations for Outpatient Follow-up:  Follow-up with orthopedic surgery. Follow-up with GI. A high-fiber diet has been recommended by your gastroenterologist. Take your medications as prescribed Continue PT OT with assistance and fall precautions.  Discharge Diagnoses:  Active Hospital Problems   Diagnosis Date Noted   Diabetic foot infection ([place]) 08/01/2021   Obesity with body mass index (BMI) of 30.0 to 39.9 03/31/2021   Diabetes type 2, controlled ([place]) 01/29/2016   Complete heart block ([place]) 01/21/2015   Right foot ulcer ([place]) 01/21/2015   Essential hypertension 05/10/2011   Subacute osteomyelitis, right ankle and foot [facility])     Resolved Hospital Problems  No resolved problems to display.    Discharge Condition: Stable  Diet recommendation: High-fiber diet.  Vitals:   08/08/21 0831 08/08/21 0835  BP: (!) 178/64   Pulse: (!) 50   Resp: 18   Temp: 98.5 F (36.9 C)   SpO2: 97% 97%    History of present illness:  Nathan Miller is a 70 y.o. male with a history of CAD, type 2 diabetes, chronic lymphedema, complete heart block s/p PPM, chronic right foot wound followed by wound care. Presented to the hospital due to purulent drainage from right foot with concern for acute infection.  Upon presentation to the ED, he received IV antibiotics empirically, IV vancomycin, Zosyn, and IV clindamycin.  He was seen by orthopedic surgery, Dr. Sharol Given.  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  Was evaluated by PT OT with recommendation for SNF.    Hospital course complicated by acute blood loss anemia with significant iron deficiency, and positive FOBT.  He was transfused 1 unit PRBCs for hemoglobin of 7.2 on 08/06/2021 and received infusion of IV Feraheme 510 mg x 1.  Repeat  hemoglobin 8.7 on 08/07/2021.  Seen by GI, post EGD and colonoscopy on 08/07/2021 by [facility], Dr. Therisa Doyne.  EGD revealed gastric ulcers and duodenal ulcer, nonbleeding.  Colonoscopy revealed diverticulosis but no perforation or abscess, polyps, internal hemorrhoids.  08/08/2021: Seen at his bedside.  He has no new complaint.  Seen by orthopedic surgery, okay to discharge to SNF.  Awaiting bed placement to SNF.  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 Sharol Given. CTA abdomen obtained by vascular surgery significant for right superficial femoral stenosis of high degree measuring 60% in addition to multifocal high-grade stenosis of left superficial femoral artery. Right transtibial amputation on 08/03/2021 by orthopedic surgery, Dr. Sharol Given. Received IV clindamycin, zosyn, and IV vancomycin perioperatively Completed course of antibiotics. Afebrile, no leukocytosis.  Nonseptic appearing. Seen by orthopedic surgery, okay to discharge to SNF. Will need to follow-up with orthopedic surgery outpatient. Pain management as needed with bowel regimen as needed.   Acute blood loss anemia postsurgery in the setting of chronic iron deficiency anemia, baseline hemoglobin 12 FOBT positive on 08/06/2021. He has received total of 2 units PRBC transfusion during this admission. Last hemoglobin 8.7 on 08/07/2021.  with active ulcer and abscess again seen. 2. Extensive distal colonic diverticulosis without evidence of acute diverticulitis. Electronically Signed   By: Miachel Roux M.D.   On: 08/02/2021 09:11   CT FOOT RIGHT W CONTRAST  Result Date: 08/01/2021 CLINICAL DATA:  Right foot infection. EXAM: CT OF THE LOWER RIGHT EXTREMITY WITH CONTRAST TECHNIQUE: Multidetector CT imaging of the lower right extremity was performed according to the standard protocol following intravenous contrast administration. CONTRAST:  40mL OMNIPAQUE IOHEXOL 350 MG/ML SOLN COMPARISON:  Right foot  x-rays from same day. CT right foot dated Mar 23, 2017. FINDINGS: Bones/Joint/Cartilage No bony destruction. Unchanged chronic periosteal reaction of the distal tibia and fibula. 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 Similar subtalar and Chopart joint ankylosis. Osteopenia. No joint effusion. Ligaments Ligaments are suboptimally evaluated by CT. Muscles and Tendons Severely atrophy. Soft tissue Progressive deep soft tissue ulceration along the lateral and plantar aspect of the hindfoot. Prominent phlegmonous change around the ulceration without well-defined drainable fluid collection. A small amount of subcutaneous emphysema tracks superiorly into the superficial posterolateral distal lower leg. Diffuse skin thickening and soft tissue swelling. No soft tissue mass. IMPRESSION: 1. Progressive deep soft tissue ulceration along the lateral and plantar aspect of the hindfoot with surrounding prominent phlegmonous change but without well-defined drainable fluid collection. Small amount of subcutaneous emphysema tracks superiorly into the superficial posterolateral distal lower leg, most likely associated with the patient's wound rather than necrotizing infection. 2. No CT evidence of acute osteomyelitis. 3. 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 Electronically Signed   By: Titus Dubin M.D.   On: 08/01/2021 14:06   DG Foot Complete Right  Result Date: 08/01/2021 CLINICAL DATA:  Purulent drainage from RIGHT foot wound, increased swelling and redness; told at [facility] to go to ED for RIGHT foot infection, history of chronic osteomyelitis EXAM: RIGHT FOOT COMPLETE - 3+ VIEW COMPARISON:  04/05/2021 FINDINGS: Osseous demineralization. Marked soft tissue swelling. Foci of soft tissue gas are identified at the posterior aspect of the RIGHT ankle extending into plantar aspect of foot laterally. Findings  consistent with either penetrating wound or soft tissue infection by gas-forming organism. Extensive degenerative changes deformity throughout the tarsals to MTP joints. Bone destruction at proximal RIGHT fifth meta tarsal and distal lateral cuboid again seen. Nonunion of old fracture at lateral base of proximal phalanx great toe. No acute fracture or bone destruction definitely visualized. IMPRESSION: Extensive soft tissue swelling with foci of soft tissue gas at the lateral heel and posterior ankle either representing penetrating wound or infection by a gas-forming organism. Extensive degenerative changes and deformities throughout RIGHT foot similar to previous exam. No definite acute fracture or new bone destruction identified. Findings called to [facility] on 08/01/2021 at 1104 hours. Electronically Signed   By: Lavonia Dana M.D.   On: 08/01/2021 10:53   VAS [place] ABI WITH/WO TBI  Result Date: 08/01/2021  LOWER EXTREMITY DOPPLER STUDY [name]  Date of Exam:   08/01/2021 Medical Rec #: [id number]     Accession #:    [id number] Date of Birth: 02-16-1951    Patient Gender: M Patient Age:   76 years Exam Location:  [facility] Procedure:      VAS [place] ABI WITH/WO TBI Referring Phys: Cherylann Ratel --------------------------------------------------------------------------------  Indications: Ulceration. High Risk Factors: Diabetes.  Comparison Study: No prior studies. Performing Technologist: Carlos Levering RVT  Examination  Progress note  WOODS GANGEMI XTK:[id number] DOB: 06/11/1951  PCP: [place], [place] date: 08/01/2021 Discharge date: 08/08/2021  Time spent: 35 minutes.  Recommendations for Outpatient Follow-up:  Follow-up with orthopedic surgery. Follow-up with GI. A high-fiber diet has been recommended by your gastroenterologist. Take your medications as prescribed Continue PT OT with assistance and fall precautions.  Discharge Diagnoses:  Active Hospital Problems   Diagnosis Date Noted   Diabetic foot infection ([place]) 08/01/2021   Obesity with body mass index (BMI) of 30.0 to 39.9 03/31/2021   Diabetes type 2, controlled ([place]) 01/29/2016   Complete heart block ([place]) 01/21/2015   Right foot ulcer ([place]) 01/21/2015   Essential hypertension 05/10/2011   Subacute osteomyelitis, right ankle and foot [facility])     Resolved Hospital Problems  No resolved problems to display.    Discharge Condition: Stable  Diet recommendation: High-fiber diet.  Vitals:   08/08/21 0831 08/08/21 0835  BP: (!) 178/64   Pulse: (!) 50   Resp: 18   Temp: 98.5 F (36.9 C)   SpO2: 97% 97%    History of present illness:  Nathan Miller is a 70 y.o. male with a history of CAD, type 2 diabetes, chronic lymphedema, complete heart block s/p PPM, chronic right foot wound followed by wound care. Presented to the hospital due to purulent drainage from right foot with concern for acute infection.  Upon presentation to the ED, he received IV antibiotics empirically, IV vancomycin, Zosyn, and IV clindamycin.  He was seen by orthopedic surgery, Dr. Sharol Given.  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  Was evaluated by PT OT with recommendation for SNF.    Hospital course complicated by acute blood loss anemia with significant iron deficiency, and positive FOBT.  He was transfused 1 unit PRBCs for hemoglobin of 7.2 on 08/06/2021 and received infusion of IV Feraheme 510 mg x 1.  Repeat  hemoglobin 8.7 on 08/07/2021.  Seen by GI, post EGD and colonoscopy on 08/07/2021 by [facility], Dr. Therisa Doyne.  EGD revealed gastric ulcers and duodenal ulcer, nonbleeding.  Colonoscopy revealed diverticulosis but no perforation or abscess, polyps, internal hemorrhoids.  08/08/2021: Seen at his bedside.  He has no new complaint.  Seen by orthopedic surgery, okay to discharge to SNF.  Awaiting bed placement to SNF.  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 Sharol Given. CTA abdomen obtained by vascular surgery significant for right superficial femoral stenosis of high degree measuring 60% in addition to multifocal high-grade stenosis of left superficial femoral artery. Right transtibial amputation on 08/03/2021 by orthopedic surgery, Dr. Sharol Given. Received IV clindamycin, zosyn, and IV vancomycin perioperatively Completed course of antibiotics. Afebrile, no leukocytosis.  Nonseptic appearing. Seen by orthopedic surgery, okay to discharge to SNF. Will need to follow-up with orthopedic surgery outpatient. Pain management as needed with bowel regimen as needed.   Acute blood loss anemia postsurgery in the setting of chronic iron deficiency anemia, baseline hemoglobin 12 FOBT positive on 08/06/2021. He has received total of 2 units PRBC transfusion during this admission. Last hemoglobin 8.7 on 08/07/2021.  Progress note  WOODS GANGEMI XTK:[id number] DOB: 06/11/1951  PCP: [place], [place] date: 08/01/2021 Discharge date: 08/08/2021  Time spent: 35 minutes.  Recommendations for Outpatient Follow-up:  Follow-up with orthopedic surgery. Follow-up with GI. A high-fiber diet has been recommended by your gastroenterologist. Take your medications as prescribed Continue PT OT with assistance and fall precautions.  Discharge Diagnoses:  Active Hospital Problems   Diagnosis Date Noted   Diabetic foot infection ([place]) 08/01/2021   Obesity with body mass index (BMI) of 30.0 to 39.9 03/31/2021   Diabetes type 2, controlled ([place]) 01/29/2016   Complete heart block ([place]) 01/21/2015   Right foot ulcer ([place]) 01/21/2015   Essential hypertension 05/10/2011   Subacute osteomyelitis, right ankle and foot [facility])     Resolved Hospital Problems  No resolved problems to display.    Discharge Condition: Stable  Diet recommendation: High-fiber diet.  Vitals:   08/08/21 0831 08/08/21 0835  BP: (!) 178/64   Pulse: (!) 50   Resp: 18   Temp: 98.5 F (36.9 C)   SpO2: 97% 97%    History of present illness:  Nathan Miller is a 70 y.o. male with a history of CAD, type 2 diabetes, chronic lymphedema, complete heart block s/p PPM, chronic right foot wound followed by wound care. Presented to the hospital due to purulent drainage from right foot with concern for acute infection.  Upon presentation to the ED, he received IV antibiotics empirically, IV vancomycin, Zosyn, and IV clindamycin.  He was seen by orthopedic surgery, Dr. Sharol Given.  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  Was evaluated by PT OT with recommendation for SNF.    Hospital course complicated by acute blood loss anemia with significant iron deficiency, and positive FOBT.  He was transfused 1 unit PRBCs for hemoglobin of 7.2 on 08/06/2021 and received infusion of IV Feraheme 510 mg x 1.  Repeat  hemoglobin 8.7 on 08/07/2021.  Seen by GI, post EGD and colonoscopy on 08/07/2021 by [facility], Dr. Therisa Doyne.  EGD revealed gastric ulcers and duodenal ulcer, nonbleeding.  Colonoscopy revealed diverticulosis but no perforation or abscess, polyps, internal hemorrhoids.  08/08/2021: Seen at his bedside.  He has no new complaint.  Seen by orthopedic surgery, okay to discharge to SNF.  Awaiting bed placement to SNF.  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 Sharol Given. CTA abdomen obtained by vascular surgery significant for right superficial femoral stenosis of high degree measuring 60% in addition to multifocal high-grade stenosis of left superficial femoral artery. Right transtibial amputation on 08/03/2021 by orthopedic surgery, Dr. Sharol Given. Received IV clindamycin, zosyn, and IV vancomycin perioperatively Completed course of antibiotics. Afebrile, no leukocytosis.  Nonseptic appearing. Seen by orthopedic surgery, okay to discharge to SNF. Will need to follow-up with orthopedic surgery outpatient. Pain management as needed with bowel regimen as needed.   Acute blood loss anemia postsurgery in the setting of chronic iron deficiency anemia, baseline hemoglobin 12 FOBT positive on 08/06/2021. He has received total of 2 units PRBC transfusion during this admission. Last hemoglobin 8.7 on 08/07/2021.  Progress note  WOODS GANGEMI XTK:[id number] DOB: 06/11/1951  PCP: [place], [place] date: 08/01/2021 Discharge date: 08/08/2021  Time spent: 35 minutes.  Recommendations for Outpatient Follow-up:  Follow-up with orthopedic surgery. Follow-up with GI. A high-fiber diet has been recommended by your gastroenterologist. Take your medications as prescribed Continue PT OT with assistance and fall precautions.  Discharge Diagnoses:  Active Hospital Problems   Diagnosis Date Noted   Diabetic foot infection ([place]) 08/01/2021   Obesity with body mass index (BMI) of 30.0 to 39.9 03/31/2021   Diabetes type 2, controlled ([place]) 01/29/2016   Complete heart block ([place]) 01/21/2015   Right foot ulcer ([place]) 01/21/2015   Essential hypertension 05/10/2011   Subacute osteomyelitis, right ankle and foot [facility])     Resolved Hospital Problems  No resolved problems to display.    Discharge Condition: Stable  Diet recommendation: High-fiber diet.  Vitals:   08/08/21 0831 08/08/21 0835  BP: (!) 178/64   Pulse: (!) 50   Resp: 18   Temp: 98.5 F (36.9 C)   SpO2: 97% 97%    History of present illness:  Nathan Miller is a 70 y.o. male with a history of CAD, type 2 diabetes, chronic lymphedema, complete heart block s/p PPM, chronic right foot wound followed by wound care. Presented to the hospital due to purulent drainage from right foot with concern for acute infection.  Upon presentation to the ED, he received IV antibiotics empirically, IV vancomycin, Zosyn, and IV clindamycin.  He was seen by orthopedic surgery, Dr. Sharol Given.  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  Was evaluated by PT OT with recommendation for SNF.    Hospital course complicated by acute blood loss anemia with significant iron deficiency, and positive FOBT.  He was transfused 1 unit PRBCs for hemoglobin of 7.2 on 08/06/2021 and received infusion of IV Feraheme 510 mg x 1.  Repeat  hemoglobin 8.7 on 08/07/2021.  Seen by GI, post EGD and colonoscopy on 08/07/2021 by [facility], Dr. Therisa Doyne.  EGD revealed gastric ulcers and duodenal ulcer, nonbleeding.  Colonoscopy revealed diverticulosis but no perforation or abscess, polyps, internal hemorrhoids.  08/08/2021: Seen at his bedside.  He has no new complaint.  Seen by orthopedic surgery, okay to discharge to SNF.  Awaiting bed placement to SNF.  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 Sharol Given. CTA abdomen obtained by vascular surgery significant for right superficial femoral stenosis of high degree measuring 60% in addition to multifocal high-grade stenosis of left superficial femoral artery. Right transtibial amputation on 08/03/2021 by orthopedic surgery, Dr. Sharol Given. Received IV clindamycin, zosyn, and IV vancomycin perioperatively Completed course of antibiotics. Afebrile, no leukocytosis.  Nonseptic appearing. Seen by orthopedic surgery, okay to discharge to SNF. Will need to follow-up with orthopedic surgery outpatient. Pain management as needed with bowel regimen as needed.   Acute blood loss anemia postsurgery in the setting of chronic iron deficiency anemia, baseline hemoglobin 12 FOBT positive on 08/06/2021. He has received total of 2 units PRBC transfusion during this admission. Last hemoglobin 8.7 on 08/07/2021.  with active ulcer and abscess again seen. 2. Extensive distal colonic diverticulosis without evidence of acute diverticulitis. Electronically Signed   By: Miachel Roux M.D.   On: 08/02/2021 09:11   CT FOOT RIGHT W CONTRAST  Result Date: 08/01/2021 CLINICAL DATA:  Right foot infection. EXAM: CT OF THE LOWER RIGHT EXTREMITY WITH CONTRAST TECHNIQUE: Multidetector CT imaging of the lower right extremity was performed according to the standard protocol following intravenous contrast administration. CONTRAST:  40mL OMNIPAQUE IOHEXOL 350 MG/ML SOLN COMPARISON:  Right foot  x-rays from same day. CT right foot dated Mar 23, 2017. FINDINGS: Bones/Joint/Cartilage No bony destruction. Unchanged chronic periosteal reaction of the distal tibia and fibula. 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 Similar subtalar and Chopart joint ankylosis. Osteopenia. No joint effusion. Ligaments Ligaments are suboptimally evaluated by CT. Muscles and Tendons Severely atrophy. Soft tissue Progressive deep soft tissue ulceration along the lateral and plantar aspect of the hindfoot. Prominent phlegmonous change around the ulceration without well-defined drainable fluid collection. A small amount of subcutaneous emphysema tracks superiorly into the superficial posterolateral distal lower leg. Diffuse skin thickening and soft tissue swelling. No soft tissue mass. IMPRESSION: 1. Progressive deep soft tissue ulceration along the lateral and plantar aspect of the hindfoot with surrounding prominent phlegmonous change but without well-defined drainable fluid collection. Small amount of subcutaneous emphysema tracks superiorly into the superficial posterolateral distal lower leg, most likely associated with the patient's wound rather than necrotizing infection. 2. No CT evidence of acute osteomyelitis. 3. 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 Electronically Signed   By: Titus Dubin M.D.   On: 08/01/2021 14:06   DG Foot Complete Right  Result Date: 08/01/2021 CLINICAL DATA:  Purulent drainage from RIGHT foot wound, increased swelling and redness; told at [facility] to go to ED for RIGHT foot infection, history of chronic osteomyelitis EXAM: RIGHT FOOT COMPLETE - 3+ VIEW COMPARISON:  04/05/2021 FINDINGS: Osseous demineralization. Marked soft tissue swelling. Foci of soft tissue gas are identified at the posterior aspect of the RIGHT ankle extending into plantar aspect of foot laterally. Findings  consistent with either penetrating wound or soft tissue infection by gas-forming organism. Extensive degenerative changes deformity throughout the tarsals to MTP joints. Bone destruction at proximal RIGHT fifth meta tarsal and distal lateral cuboid again seen. Nonunion of old fracture at lateral base of proximal phalanx great toe. No acute fracture or bone destruction definitely visualized. IMPRESSION: Extensive soft tissue swelling with foci of soft tissue gas at the lateral heel and posterior ankle either representing penetrating wound or infection by a gas-forming organism. Extensive degenerative changes and deformities throughout RIGHT foot similar to previous exam. No definite acute fracture or new bone destruction identified. Findings called to [facility] on 08/01/2021 at 1104 hours. Electronically Signed   By: Lavonia Dana M.D.   On: 08/01/2021 10:53   VAS [place] ABI WITH/WO TBI  Result Date: 08/01/2021  LOWER EXTREMITY DOPPLER STUDY [name]  Date of Exam:   08/01/2021 Medical Rec #: [id number]     Accession #:    [id number] Date of Birth: 02-16-1951    Patient Gender: M Patient Age:   76 years Exam Location:  [facility] Procedure:      VAS [place] ABI WITH/WO TBI Referring Phys: Cherylann Ratel --------------------------------------------------------------------------------  Indications: Ulceration. High Risk Factors: Diabetes.  Comparison Study: No prior studies. Performing Technologist: Carlos Levering RVT  Examination  with active ulcer and abscess again seen. 2. Extensive distal colonic diverticulosis without evidence of acute diverticulitis. Electronically Signed   By: Miachel Roux M.D.   On: 08/02/2021 09:11   CT FOOT RIGHT W CONTRAST  Result Date: 08/01/2021 CLINICAL DATA:  Right foot infection. EXAM: CT OF THE LOWER RIGHT EXTREMITY WITH CONTRAST TECHNIQUE: Multidetector CT imaging of the lower right extremity was performed according to the standard protocol following intravenous contrast administration. CONTRAST:  40mL OMNIPAQUE IOHEXOL 350 MG/ML SOLN COMPARISON:  Right foot  x-rays from same day. CT right foot dated Mar 23, 2017. FINDINGS: Bones/Joint/Cartilage No bony destruction. Unchanged chronic periosteal reaction of the distal tibia and fibula. 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 Similar subtalar and Chopart joint ankylosis. Osteopenia. No joint effusion. Ligaments Ligaments are suboptimally evaluated by CT. Muscles and Tendons Severely atrophy. Soft tissue Progressive deep soft tissue ulceration along the lateral and plantar aspect of the hindfoot. Prominent phlegmonous change around the ulceration without well-defined drainable fluid collection. A small amount of subcutaneous emphysema tracks superiorly into the superficial posterolateral distal lower leg. Diffuse skin thickening and soft tissue swelling. No soft tissue mass. IMPRESSION: 1. Progressive deep soft tissue ulceration along the lateral and plantar aspect of the hindfoot with surrounding prominent phlegmonous change but without well-defined drainable fluid collection. Small amount of subcutaneous emphysema tracks superiorly into the superficial posterolateral distal lower leg, most likely associated with the patient's wound rather than necrotizing infection. 2. No CT evidence of acute osteomyelitis. 3. 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 Electronically Signed   By: Titus Dubin M.D.   On: 08/01/2021 14:06   DG Foot Complete Right  Result Date: 08/01/2021 CLINICAL DATA:  Purulent drainage from RIGHT foot wound, increased swelling and redness; told at [facility] to go to ED for RIGHT foot infection, history of chronic osteomyelitis EXAM: RIGHT FOOT COMPLETE - 3+ VIEW COMPARISON:  04/05/2021 FINDINGS: Osseous demineralization. Marked soft tissue swelling. Foci of soft tissue gas are identified at the posterior aspect of the RIGHT ankle extending into plantar aspect of foot laterally. Findings  consistent with either penetrating wound or soft tissue infection by gas-forming organism. Extensive degenerative changes deformity throughout the tarsals to MTP joints. Bone destruction at proximal RIGHT fifth meta tarsal and distal lateral cuboid again seen. Nonunion of old fracture at lateral base of proximal phalanx great toe. No acute fracture or bone destruction definitely visualized. IMPRESSION: Extensive soft tissue swelling with foci of soft tissue gas at the lateral heel and posterior ankle either representing penetrating wound or infection by a gas-forming organism. Extensive degenerative changes and deformities throughout RIGHT foot similar to previous exam. No definite acute fracture or new bone destruction identified. Findings called to [facility] on 08/01/2021 at 1104 hours. Electronically Signed   By: Lavonia Dana M.D.   On: 08/01/2021 10:53   VAS [place] ABI WITH/WO TBI  Result Date: 08/01/2021  LOWER EXTREMITY DOPPLER STUDY [name]  Date of Exam:   08/01/2021 Medical Rec #: [id number]     Accession #:    [id number] Date of Birth: 02-16-1951    Patient Gender: M Patient Age:   76 years Exam Location:  [facility] Procedure:      VAS [place] ABI WITH/WO TBI Referring Phys: Cherylann Ratel --------------------------------------------------------------------------------  Indications: Ulceration. High Risk Factors: Diabetes.  Comparison Study: No prior studies. Performing Technologist: Carlos Levering RVT  Examination  with active ulcer and abscess again seen. 2. Extensive distal colonic diverticulosis without evidence of acute diverticulitis. Electronically Signed   By: Miachel Roux M.D.   On: 08/02/2021 09:11   CT FOOT RIGHT W CONTRAST  Result Date: 08/01/2021 CLINICAL DATA:  Right foot infection. EXAM: CT OF THE LOWER RIGHT EXTREMITY WITH CONTRAST TECHNIQUE: Multidetector CT imaging of the lower right extremity was performed according to the standard protocol following intravenous contrast administration. CONTRAST:  40mL OMNIPAQUE IOHEXOL 350 MG/ML SOLN COMPARISON:  Right foot  x-rays from same day. CT right foot dated Mar 23, 2017. FINDINGS: Bones/Joint/Cartilage No bony destruction. Unchanged chronic periosteal reaction of the distal tibia and fibula. 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 Similar subtalar and Chopart joint ankylosis. Osteopenia. No joint effusion. Ligaments Ligaments are suboptimally evaluated by CT. Muscles and Tendons Severely atrophy. Soft tissue Progressive deep soft tissue ulceration along the lateral and plantar aspect of the hindfoot. Prominent phlegmonous change around the ulceration without well-defined drainable fluid collection. A small amount of subcutaneous emphysema tracks superiorly into the superficial posterolateral distal lower leg. Diffuse skin thickening and soft tissue swelling. No soft tissue mass. IMPRESSION: 1. Progressive deep soft tissue ulceration along the lateral and plantar aspect of the hindfoot with surrounding prominent phlegmonous change but without well-defined drainable fluid collection. Small amount of subcutaneous emphysema tracks superiorly into the superficial posterolateral distal lower leg, most likely associated with the patient's wound rather than necrotizing infection. 2. No CT evidence of acute osteomyelitis. 3. 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 Electronically Signed   By: Titus Dubin M.D.   On: 08/01/2021 14:06   DG Foot Complete Right  Result Date: 08/01/2021 CLINICAL DATA:  Purulent drainage from RIGHT foot wound, increased swelling and redness; told at [facility] to go to ED for RIGHT foot infection, history of chronic osteomyelitis EXAM: RIGHT FOOT COMPLETE - 3+ VIEW COMPARISON:  04/05/2021 FINDINGS: Osseous demineralization. Marked soft tissue swelling. Foci of soft tissue gas are identified at the posterior aspect of the RIGHT ankle extending into plantar aspect of foot laterally. Findings  consistent with either penetrating wound or soft tissue infection by gas-forming organism. Extensive degenerative changes deformity throughout the tarsals to MTP joints. Bone destruction at proximal RIGHT fifth meta tarsal and distal lateral cuboid again seen. Nonunion of old fracture at lateral base of proximal phalanx great toe. No acute fracture or bone destruction definitely visualized. IMPRESSION: Extensive soft tissue swelling with foci of soft tissue gas at the lateral heel and posterior ankle either representing penetrating wound or infection by a gas-forming organism. Extensive degenerative changes and deformities throughout RIGHT foot similar to previous exam. No definite acute fracture or new bone destruction identified. Findings called to [facility] on 08/01/2021 at 1104 hours. Electronically Signed   By: Lavonia Dana M.D.   On: 08/01/2021 10:53   VAS [place] ABI WITH/WO TBI  Result Date: 08/01/2021  LOWER EXTREMITY DOPPLER STUDY [name]  Date of Exam:   08/01/2021 Medical Rec #: [id number]     Accession #:    [id number] Date of Birth: 02-16-1951    Patient Gender: M Patient Age:   76 years Exam Location:  [facility] Procedure:      VAS [place] ABI WITH/WO TBI Referring Phys: Cherylann Ratel --------------------------------------------------------------------------------  Indications: Ulceration. High Risk Factors: Diabetes.  Comparison Study: No prior studies. Performing Technologist: Carlos Levering RVT  Examination

## 2021-08-08 NOTE — TOC Progression Note (Signed)
Transition of Care [facility]) - Initial/Assessment Note    Patient Details  Name: Nathan Miller MRN: [id number] Date of Birth: Jul 07, 1951  Transition of Care [facility]) CM/SW Contact:    Ralene Bathe, LCSWA Phone Number: 08/08/2021, 10:05 AM  Clinical Narrative:                  CSW received a message from [place] with [phone] stated that the facility is able to extend a bed offer to the patient.  CSW returned the call to confirm that a bed will be available tomorrow.  09:30-  CSW called [facility] and requested insurance auth for SNF and PTAR.  CSW was informed that a determination could be made as early as tomorrow.  9:45-  CSW called the patient's spouse to provide an update.  There was no answer.  CSW left a VM with the above information.  Care Team notified.     Patient Goals and CMS Choice        Expected Discharge Plan and Services           Expected Discharge Date: 08/05/21                                    Prior Living Arrangements/Services                       Activities of Daily Living Home Assistive Devices/Equipment: Other (Comment), CBG Meter, Oxygen (cane) ADL Screening (condition at time of admission) Patient's cognitive ability adequate to safely complete daily activities?: Yes Is the patient deaf or have difficulty hearing?: No Does the patient have difficulty seeing, even when wearing glasses/contacts?: No Does the patient have difficulty concentrating, remembering, or making decisions?: No Patient able to express need for assistance with ADLs?: Yes Does the patient have difficulty dressing or bathing?: No Independently performs ADLs?: No Communication: Independent Dressing (OT): Independent Grooming: Independent Feeding: Independent Bathing: Independent Toileting: Needs assistance Is this a change from baseline?: Pre-admission baseline In/Out Bed: Needs assistance Is this a change from baseline?: Pre-admission  baseline Walks in Home: Needs assistance Is this a change from baseline?: Pre-admission baseline Does the patient have difficulty walking or climbing stairs?: Yes Weakness of Legs: Both Weakness of Arms/Hands: Both  Permission Sought/Granted                  Emotional Assessment              Admission diagnosis:  Diabetic foot infection (HCC) [W29.562, L08.9] Patient Active Problem List   Diagnosis Date Noted   Diabetic foot infection (HCC) 08/01/2021   Bacteremia 04/20/2021   Acute respiratory failure with hypoxia (HCC) 03/31/2021   Obesity with body mass index (BMI) of 30.0 to 39.9 03/31/2021   Elevated d-dimer 03/31/2021   Elevated brain natriuretic peptide (BNP) level 03/31/2021   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   Umbilical hernia 01/29/2016   Obesity 01/29/2016   Marijuana use 01/29/2016   Diabetes type 2, controlled (HCC) 01/29/2016   Diverticulitis of intestine with perforation without bleeding    Blood poisoning    Diverticulitis of intestine with perforation 06/13/2015   Diverticulitis 06/13/2015   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 [place] - [address] [place] [address] Phone: [phone] Fax: [phone]     Social Determinants of Health (SDOH) Interventions    Readmission Risk Interventions No  flowsheet data found.

## 2021-08-09 ENCOUNTER — Institutional Professional Consult (permissible substitution): Payer: PPO | Admitting: Internal Medicine

## 2021-08-09 ENCOUNTER — Encounter (HOSPITAL_COMMUNITY): Payer: Self-pay | Admitting: Gastroenterology

## 2021-08-09 DIAGNOSIS — D62 Acute posthemorrhagic anemia: Secondary | ICD-10-CM | POA: Diagnosis not present

## 2021-08-09 DIAGNOSIS — I739 Peripheral vascular disease, unspecified: Secondary | ICD-10-CM | POA: Diagnosis not present

## 2021-08-09 DIAGNOSIS — E11628 Type 2 diabetes mellitus with other skin complications: Secondary | ICD-10-CM | POA: Diagnosis not present

## 2021-08-09 DIAGNOSIS — I2510 Atherosclerotic heart disease of native coronary artery without angina pectoris: Secondary | ICD-10-CM | POA: Diagnosis not present

## 2021-08-09 DIAGNOSIS — I10 Essential (primary) hypertension: Secondary | ICD-10-CM | POA: Diagnosis not present

## 2021-08-09 DIAGNOSIS — L089 Local infection of the skin and subcutaneous tissue, unspecified: Secondary | ICD-10-CM | POA: Diagnosis not present

## 2021-08-09 DIAGNOSIS — E1169 Type 2 diabetes mellitus with other specified complication: Secondary | ICD-10-CM | POA: Diagnosis not present

## 2021-08-09 DIAGNOSIS — E876 Hypokalemia: Secondary | ICD-10-CM | POA: Diagnosis not present

## 2021-08-09 DIAGNOSIS — R531 Weakness: Secondary | ICD-10-CM | POA: Diagnosis not present

## 2021-08-09 DIAGNOSIS — Z89511 Acquired absence of right leg below knee: Secondary | ICD-10-CM | POA: Diagnosis not present

## 2021-08-09 DIAGNOSIS — E871 Hypo-osmolality and hyponatremia: Secondary | ICD-10-CM | POA: Diagnosis not present

## 2021-08-09 DIAGNOSIS — R52 Pain, unspecified: Secondary | ICD-10-CM | POA: Diagnosis not present

## 2021-08-09 DIAGNOSIS — Z743 Need for continuous supervision: Secondary | ICD-10-CM | POA: Diagnosis not present

## 2021-08-09 DIAGNOSIS — I5032 Chronic diastolic (congestive) heart failure: Secondary | ICD-10-CM | POA: Diagnosis not present

## 2021-08-09 DIAGNOSIS — J449 Chronic obstructive pulmonary disease, unspecified: Secondary | ICD-10-CM | POA: Diagnosis not present

## 2021-08-09 DIAGNOSIS — E669 Obesity, unspecified: Secondary | ICD-10-CM | POA: Diagnosis not present

## 2021-08-09 DIAGNOSIS — R0902 Hypoxemia: Secondary | ICD-10-CM | POA: Diagnosis not present

## 2021-08-09 DIAGNOSIS — E785 Hyperlipidemia, unspecified: Secondary | ICD-10-CM | POA: Diagnosis not present

## 2021-08-09 DIAGNOSIS — Z4781 Encounter for orthopedic aftercare following surgical amputation: Secondary | ICD-10-CM | POA: Diagnosis not present

## 2021-08-09 DIAGNOSIS — E8809 Other disorders of plasma-protein metabolism, not elsewhere classified: Secondary | ICD-10-CM | POA: Diagnosis not present

## 2021-08-09 DIAGNOSIS — I509 Heart failure, unspecified: Secondary | ICD-10-CM | POA: Diagnosis not present

## 2021-08-09 DIAGNOSIS — D509 Iron deficiency anemia, unspecified: Secondary | ICD-10-CM | POA: Diagnosis not present

## 2021-08-09 LAB — GLUCOSE, CAPILLARY
Glucose-Capillary: 101 mg/dL — ABNORMAL HIGH (ref 70–99)
Glucose-Capillary: 161 mg/dL — ABNORMAL HIGH (ref 70–99)

## 2021-08-09 LAB — CREATININE, SERUM
Creatinine, Ser: 0.94 mg/dL (ref 0.61–1.24)
GFR, Estimated: >60 mL/min (ref >60)

## 2021-08-09 LAB — SURGICAL PATHOLOGY

## 2021-08-09 MED ORDER — ADULT MULTIVITAMIN W/MINERALS CH
1.0000 | ORAL_TABLET | Freq: Every day | ORAL | Status: DC
  Administered 2021-08-09: 1 via ORAL
  Filled 2021-08-09: qty 1

## 2021-08-09 NOTE — Progress Notes (Signed)
Pt left via [facility] and had all pt belongings in his possession. Ivs removed without complications. Wound vac was removed prior to DC and Dressing orders put in place.

## 2021-08-09 NOTE — TOC Progression Note (Signed)
Transition of Care [facility]) - Initial/Assessment Note    Patient Details  Name: Nathan Miller MRN: [id number] Date of Birth: 1951/07/06  Transition of Care [facility]) CM/SW Contact:    Ralene Bathe, LCSWA Phone Number: 08/09/2021, 9:54 AM  Clinical Narrative:                 09:54-  CSW called Melissa with [facility] again today.  There was no answer.  CSW then called the facility in an attempt to receive transfer information for the patient.  CSW informed that morning meeting was in progress and to call back around 10:30.          Patient Goals and CMS Choice        Expected Discharge Plan and Services           Expected Discharge Date: 08/08/21                                    Prior Living Arrangements/Services                       Activities of Daily Living Home Assistive Devices/Equipment: Other (Comment), CBG Meter, Oxygen (cane) ADL Screening (condition at time of admission) Patient's cognitive ability adequate to safely complete daily activities?: Yes Is the patient deaf or have difficulty hearing?: No Does the patient have difficulty seeing, even when wearing glasses/contacts?: No Does the patient have difficulty concentrating, remembering, or making decisions?: No Patient able to express need for assistance with ADLs?: Yes Does the patient have difficulty dressing or bathing?: No Independently performs ADLs?: No Communication: Independent Dressing (OT): Independent Grooming: Independent Feeding: Independent Bathing: Independent Toileting: Needs assistance Is this a change from baseline?: Pre-admission baseline In/Out Bed: Needs assistance Is this a change from baseline?: Pre-admission baseline Walks in Home: Needs assistance Is this a change from baseline?: Pre-admission baseline Does the patient have difficulty walking or climbing stairs?: Yes Weakness of Legs: Both Weakness of Arms/Hands: Both  Permission Sought/Granted                   Emotional Assessment              Admission diagnosis:  Diabetic foot infection (HCC) [N82.956, L08.9] Patient Active Problem List   Diagnosis Date Noted   Diabetic foot infection (HCC) 08/01/2021   Bacteremia 04/20/2021   Acute respiratory failure with hypoxia (HCC) 03/31/2021   Obesity with body mass index (BMI) of 30.0 to 39.9 03/31/2021   Elevated d-dimer 03/31/2021   Elevated brain natriuretic peptide (BNP) level 03/31/2021   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   Umbilical hernia 01/29/2016   Obesity 01/29/2016   Marijuana use 01/29/2016   Diabetes type 2, controlled (HCC) 01/29/2016   Diverticulitis of intestine with perforation without bleeding    Blood poisoning    Diverticulitis of intestine with perforation 06/13/2015   Diverticulitis 06/13/2015   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   Faintness    Bradycardia 01/21/2015   Syncope 01/21/2015   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   Right foot ulcer (HCC) 01/21/2015   Diabetes mellitus type 2, uncontrolled 01/21/2015   Hyperlipidemia LDL goal < 100 02/11/2013   Tobacco use disorder 02/11/2013   Anxiety state, unspecified 02/11/2013   Essential hypertension 05/10/2011   Papule 03/20/2011   Subacute osteomyelitis, right ankle and foot (HCC)    CAD S/P percutaneous coronary angioplasty;  bms X 2 to RCA 2001 01/22/2000    Class: Diagnosis of   PCP:  [facility]:   [facility] [address] [place] - [place] [address] [place] [address] Phone: [phone] Fax: [phone]     Social Determinants of Health (SDOH) Interventions    Readmission Risk Interventions No flowsheet data found.

## 2021-08-09 NOTE — TOC Transition Note (Signed)
Transition of Care [facility]) - CM/SW Discharge Note   Patient Details  Name: Nathan Miller MRN: [id number] Date of Birth: 05/30/1951  Transition of Care [facility]) CM/SW Contact:  Milinda Antis, [place] Phone Number: 08/09/2021, 10:38 AM   Clinical Narrative:     Patient will DC to: [facility]F Anticipated DC date: 08/09/2021 Family notified: Yes Transport by:    Per MD patient ready for DC to SNF. RN to call report prior to discharge [phone] room 118B. RN, patient, patient's family, and facility notified of DC. Discharge Summary and FL2 sent to facility. DC packet on chart. Ambulance transport will be requested for patient when RN reports that the patient is ready.   CSW will sign off for now as social work intervention is no longer needed. Please consult [place] again if new needs arise.          Patient Goals and CMS Choice        Discharge Placement                       Discharge Plan and Services                                     Social Determinants of Health (SDOH) Interventions     Readmission Risk Interventions No flowsheet data found.

## 2021-08-09 NOTE — Progress Notes (Signed)
Initial Nutrition Assessment  DOCUMENTATION CODES:   Obesity unspecified  INTERVENTION:   -D/c Juven -MVI with minerals daily -Double protein portions with meals -Magic cup BID with meals, each supplement provides 290 kcal and 9 grams of protein   NUTRITION DIAGNOSIS:   Increased nutrient needs related to wound healing as evidenced by estimated needs.  GOAL:   Patient will meet greater than or equal to 90% of their needs  MONITOR:   PO intake, Supplement acceptance, Labs, Weight trends, Skin, I & O's  REASON FOR ASSESSMENT:   Other (Comment)    ASSESSMENT:   Nathan Miller is a 70 y.o. male with medical history significant of CAD, DM2, chronic lymphedema, CHB s/p PPM, chronic foot wound. Presenting right foot wound. Has a chronic right foot wound that is followed by the wound care center. For the last several weeks, he's been in using dankin's solution to keep it clean. He has had a pocket of purulent fluid drain from it during this time. He ran out of dankin's solution a few days ago. Over the last 2 days, his foot has become more swollen and red. He has had subjective fevers. His wife has been dry packing the wound for the last 2 days. He had an appt with the wound care center today. Upon seeing the condition of his foot, it was recommended that he come to the ED immediately for care. He denies any other aggravating or alleviating factors.  Pt admitted with rt DM foot wound.   10/5- s/p PROCEDURE: Transtibial amputation Application of Prevena wound VAC 10/9- s/p EDG- revealed erosive gastropathy with no bleeding and no stigmata of recent bleeding, non-bleeding gastric ulcers (biopsied), erythematous mucosa in the antrum, and non-bleeding duodenal ulcer; s/p colonoscopy- one polyp in cecum (biopsied), nodular and thickened folds of the mucosa in the recto-sigmoid colon (biopsied), diverticulosis in sigmoid colon and descending colon, non-bleeding internal hemorrhoids   Reviewed  I/O's: -1.8 L x 24 hours and -1.2 L since admission  UOP: 1.8 L x 24 hours  Per podiatry notes, pt had wound for about 10 years and was recently followed by the wound care center. He had worsening drainage from foot for the past few days PTA.   Pt unavailable at time of visit. Attempted to speak with pt via call to hospital room phone, however, unable to reach. RD unable to obtain further nutrition-related history or complete nutrition-focused physical exam at this time.    Pt with very good appetite. Noted meal completions 100%.   Reviewed wt hx; pt has experienced a 5.5% wt loss over the past 4 months, which is not significant for time frame. Suspect some wt loss is related to amputation.   RD will d/c Juven, secondary to national shortage.   Medications reviewed and include vitamin C, dulcolax, colace, ferrous sulfate, senokot, demadex, and zinc sulfate.   Obesity is a complex, chronic medical condition that is optimally managed by a multidisciplinary care team. Weight loss is not an ideal goal for an acute inpatient hospitalization. However, if further work-up for obesity is warranted, consider outpatient referral to outpatient bariatric service and/or [facility].    Lab Results  Component Value Date   HGBA1C 5.5 08/02/2021   PTA DM medications are 500 mg metformin BID.   Labs reviewed: CBGS: 100-159 (inpatient orders for glycemic control are 0-15 units insulin aspart TID with meals).    Diet Order:   Diet Order  Diet Carb Modified Fluid consistency: Thin; Room service appropriate? Yes  Diet effective now                   EDUCATION NEEDS:   No education needs have been identified at this time  Skin:  Skin Assessment: Skin Integrity Issues: Skin Integrity Issues:: Wound VAC Wound Vac: rt leg s/p rt BKA  Last BM:  08/07/21  Height:   Ht Readings from Last 1 Encounters:  08/07/21 5\' 9"  (1.753 m)    Weight:    Wt Readings from Last 1 Encounters:  08/08/21 111.9 kg    Ideal Body Weight:  68 kg (adjusted for rt BKA)  BMI:  Body mass index is 36.43 kg/m.  Estimated Nutritional Needs:   Kcal:  2050-2250  Protein:  120-135 grams  Fluid:  > 2 L    Loistine Chance, RD, LDN, [place] Registered Dietitian II Certified Diabetes Care and Education Specialist Please refer to [facility] for RD and/or RD on-call/weekend/after hours pager

## 2021-08-09 NOTE — Discharge Summary (Signed)
with active ulcer and abscess again seen. 2. Extensive distal colonic diverticulosis without evidence of acute diverticulitis. Electronically Signed   By: Miachel Roux M.D.   On: 08/02/2021 09:11   CT FOOT RIGHT W CONTRAST  Result Date: 08/01/2021 CLINICAL DATA:  Right foot infection. EXAM: CT OF THE LOWER RIGHT EXTREMITY WITH CONTRAST TECHNIQUE: Multidetector CT imaging of the lower right extremity was performed according to the standard protocol following intravenous contrast administration. CONTRAST:  62mL OMNIPAQUE IOHEXOL 350  MG/ML SOLN COMPARISON:  Right foot x-rays from same day. CT right foot dated Mar 23, 2017. FINDINGS: Bones/Joint/Cartilage No bony destruction. Unchanged chronic periosteal reaction of the distal tibia and fibula. 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 Similar subtalar and Chopart joint ankylosis. Osteopenia. No joint effusion. Ligaments Ligaments are suboptimally evaluated by CT. Muscles and Tendons Severely atrophy. Soft tissue Progressive deep soft tissue ulceration along the lateral and plantar aspect of the hindfoot. Prominent phlegmonous change around the ulceration without well-defined drainable fluid collection. A small amount of subcutaneous emphysema tracks superiorly into the superficial posterolateral distal lower leg. Diffuse skin thickening and soft tissue swelling. No soft tissue mass. IMPRESSION: 1. Progressive deep soft tissue ulceration along the lateral and plantar aspect of the hindfoot with surrounding prominent phlegmonous change but without well-defined drainable fluid collection. Small amount of subcutaneous emphysema tracks superiorly into the superficial posterolateral distal lower leg, most likely associated with the patient's wound rather than necrotizing infection. 2. No CT evidence of acute osteomyelitis. 3. 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 Electronically Signed   By: Titus Dubin M.D.   On: 08/01/2021 14:06   DG Foot Complete Right  Result Date: 08/01/2021 CLINICAL DATA:  Purulent drainage from RIGHT foot wound, increased swelling and redness; told at [facility] to go to ED for RIGHT foot infection, history of chronic osteomyelitis EXAM: RIGHT FOOT COMPLETE - 3+ VIEW COMPARISON:  04/05/2021 FINDINGS: Osseous demineralization. Marked soft tissue swelling. Foci of soft tissue gas are identified at the posterior aspect of the RIGHT ankle extending into plantar  aspect of foot laterally. Findings consistent with either penetrating wound or soft tissue infection by gas-forming organism. Extensive degenerative changes deformity throughout the tarsals to MTP joints. Bone destruction at proximal RIGHT fifth meta tarsal and distal lateral cuboid again seen. Nonunion of old fracture at lateral base of proximal phalanx great toe. No acute fracture or bone destruction definitely visualized. IMPRESSION: Extensive soft tissue swelling with foci of soft tissue gas at the lateral heel and posterior ankle either representing penetrating wound or infection by a gas-forming organism. Extensive degenerative changes and deformities throughout RIGHT foot similar to previous exam. No definite acute fracture or new bone destruction identified. Findings called to [facility] on 08/01/2021 at 1104 hours. Electronically Signed   By: Lavonia Dana M.D.   On: 08/01/2021 10:53   VAS [place] ABI WITH/WO TBI  Result Date: 08/01/2021  LOWER EXTREMITY DOPPLER STUDY [name]  Date of Exam:   08/01/2021 Medical Rec #: [id number]     Accession #:    [id number] Date of Birth: 1951-05-17    Patient Gender: M Patient Age:   70 years Exam Location:  [facility] Procedure:      VAS [place] ABI WITH/WO TBI Referring Phys: Cherylann Ratel --------------------------------------------------------------------------------  Indications: Ulceration. High Risk Factors: Diabetes.  Comparison Study: No prior studies. Performing Technologist: Carlos Levering RVT  Examination  Progress note  Nathan Miller JKD:[id number] DOB: 12-18-1950  PCP: [place], [place] date: 08/01/2021 Discharge date: 08/09/2021  Time spent: 35 minutes.  Recommendations for Outpatient Follow-up:  Follow-up with orthopedic surgery. Follow-up with GI. A high-fiber diet has been recommended by your gastroenterologist. Take your medications as prescribed Continue PT OT with assistance and fall precautions.  Discharge Diagnoses:  Active Hospital Problems   Diagnosis Date Noted   Diabetic foot infection ([place]) 08/01/2021   Obesity with body mass index (BMI) of 30.0 to 39.9 03/31/2021   Diabetes type 2, controlled ([place]) 01/29/2016   Complete heart block ([place]) 01/21/2015   Right foot ulcer ([place]) 01/21/2015   Essential hypertension 05/10/2011   Subacute osteomyelitis, right ankle and foot [facility])     Resolved Hospital Problems  No resolved problems to display.    Discharge Condition: Stable  Diet recommendation: High-fiber diet.  Vitals:   08/09/21 0757 08/09/21 0828  BP: (!) 170/64   Pulse: (!) 50   Resp: 17   Temp: 98.3 F (36.8 C)   SpO2: 98% 98%    History of present illness:  Nathan Miller is a 70 y.o. male with a history of CAD, type 2 diabetes, chronic lymphedema, complete heart block s/p PPM, chronic right foot wound followed by wound care. Presented to the hospital due to purulent drainage from right foot with concern for acute infection.  Upon presentation to the ED, he received IV antibiotics empirically, IV vancomycin, Zosyn, and IV clindamycin.  He was seen by orthopedic surgery, Dr. Sharol Given.  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  Was evaluated by PT OT with recommendation for SNF.    Hospital course complicated by acute blood loss anemia with significant iron deficiency, and positive FOBT.  He was transfused 1 unit PRBCs for hemoglobin of 7.2 on 08/06/2021 and received infusion of IV Feraheme 510 mg x 1.  Repeat  hemoglobin 8.7 on 08/07/2021.  Seen by GI, post EGD and colonoscopy on 08/07/2021 by [facility], Dr. Therisa Doyne.  EGD revealed gastric ulcers and duodenal ulcer, nonbleeding.  Colonoscopy revealed diverticulosis but no perforation or abscess, polyps, internal hemorrhoids.  08/09/2021: Seen at his bedside.  He has no new complaints.  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 Sharol Given. CTA abdomen obtained by vascular surgery significant for right superficial femoral stenosis of high degree measuring 60% in addition to multifocal high-grade stenosis of left superficial femoral artery. Right transtibial amputation on 08/03/2021 by orthopedic surgery, Dr. Sharol Given. Received IV clindamycin, zosyn, and IV vancomycin perioperatively Completed course of antibiotics. Afebrile, no leukocytosis.  Nonseptic appearing. Seen by orthopedic surgery, okay to discharge to SNF. Will need to follow-up with orthopedic surgery outpatient. Pain management as needed with bowel regimen as needed.   Acute blood loss anemia postsurgery in the setting of chronic iron deficiency anemia, baseline hemoglobin 12 FOBT positive on 08/06/2021. He has received total of 2 units PRBC transfusion during this admission. Last hemoglobin 8.7 on 08/07/2021. Significant iron deficiency on iron studies done on 08/03/2021 He is on iron supplement every other  Progress note  Nathan Miller JKD:[id number] DOB: 12-18-1950  PCP: [place], [place] date: 08/01/2021 Discharge date: 08/09/2021  Time spent: 35 minutes.  Recommendations for Outpatient Follow-up:  Follow-up with orthopedic surgery. Follow-up with GI. A high-fiber diet has been recommended by your gastroenterologist. Take your medications as prescribed Continue PT OT with assistance and fall precautions.  Discharge Diagnoses:  Active Hospital Problems   Diagnosis Date Noted   Diabetic foot infection ([place]) 08/01/2021   Obesity with body mass index (BMI) of 30.0 to 39.9 03/31/2021   Diabetes type 2, controlled ([place]) 01/29/2016   Complete heart block ([place]) 01/21/2015   Right foot ulcer ([place]) 01/21/2015   Essential hypertension 05/10/2011   Subacute osteomyelitis, right ankle and foot [facility])     Resolved Hospital Problems  No resolved problems to display.    Discharge Condition: Stable  Diet recommendation: High-fiber diet.  Vitals:   08/09/21 0757 08/09/21 0828  BP: (!) 170/64   Pulse: (!) 50   Resp: 17   Temp: 98.3 F (36.8 C)   SpO2: 98% 98%    History of present illness:  Nathan Miller is a 70 y.o. male with a history of CAD, type 2 diabetes, chronic lymphedema, complete heart block s/p PPM, chronic right foot wound followed by wound care. Presented to the hospital due to purulent drainage from right foot with concern for acute infection.  Upon presentation to the ED, he received IV antibiotics empirically, IV vancomycin, Zosyn, and IV clindamycin.  He was seen by orthopedic surgery, Dr. Sharol Given.  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  Was evaluated by PT OT with recommendation for SNF.    Hospital course complicated by acute blood loss anemia with significant iron deficiency, and positive FOBT.  He was transfused 1 unit PRBCs for hemoglobin of 7.2 on 08/06/2021 and received infusion of IV Feraheme 510 mg x 1.  Repeat  hemoglobin 8.7 on 08/07/2021.  Seen by GI, post EGD and colonoscopy on 08/07/2021 by [facility], Dr. Therisa Doyne.  EGD revealed gastric ulcers and duodenal ulcer, nonbleeding.  Colonoscopy revealed diverticulosis but no perforation or abscess, polyps, internal hemorrhoids.  08/09/2021: Seen at his bedside.  He has no new complaints.  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 Sharol Given. CTA abdomen obtained by vascular surgery significant for right superficial femoral stenosis of high degree measuring 60% in addition to multifocal high-grade stenosis of left superficial femoral artery. Right transtibial amputation on 08/03/2021 by orthopedic surgery, Dr. Sharol Given. Received IV clindamycin, zosyn, and IV vancomycin perioperatively Completed course of antibiotics. Afebrile, no leukocytosis.  Nonseptic appearing. Seen by orthopedic surgery, okay to discharge to SNF. Will need to follow-up with orthopedic surgery outpatient. Pain management as needed with bowel regimen as needed.   Acute blood loss anemia postsurgery in the setting of chronic iron deficiency anemia, baseline hemoglobin 12 FOBT positive on 08/06/2021. He has received total of 2 units PRBC transfusion during this admission. Last hemoglobin 8.7 on 08/07/2021. Significant iron deficiency on iron studies done on 08/03/2021 He is on iron supplement every other  Progress note  Nathan Miller JKD:[id number] DOB: 12-18-1950  PCP: [place], [place] date: 08/01/2021 Discharge date: 08/09/2021  Time spent: 35 minutes.  Recommendations for Outpatient Follow-up:  Follow-up with orthopedic surgery. Follow-up with GI. A high-fiber diet has been recommended by your gastroenterologist. Take your medications as prescribed Continue PT OT with assistance and fall precautions.  Discharge Diagnoses:  Active Hospital Problems   Diagnosis Date Noted   Diabetic foot infection ([place]) 08/01/2021   Obesity with body mass index (BMI) of 30.0 to 39.9 03/31/2021   Diabetes type 2, controlled ([place]) 01/29/2016   Complete heart block ([place]) 01/21/2015   Right foot ulcer ([place]) 01/21/2015   Essential hypertension 05/10/2011   Subacute osteomyelitis, right ankle and foot [facility])     Resolved Hospital Problems  No resolved problems to display.    Discharge Condition: Stable  Diet recommendation: High-fiber diet.  Vitals:   08/09/21 0757 08/09/21 0828  BP: (!) 170/64   Pulse: (!) 50   Resp: 17   Temp: 98.3 F (36.8 C)   SpO2: 98% 98%    History of present illness:  Nathan Miller is a 70 y.o. male with a history of CAD, type 2 diabetes, chronic lymphedema, complete heart block s/p PPM, chronic right foot wound followed by wound care. Presented to the hospital due to purulent drainage from right foot with concern for acute infection.  Upon presentation to the ED, he received IV antibiotics empirically, IV vancomycin, Zosyn, and IV clindamycin.  He was seen by orthopedic surgery, Dr. Sharol Given.  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  Was evaluated by PT OT with recommendation for SNF.    Hospital course complicated by acute blood loss anemia with significant iron deficiency, and positive FOBT.  He was transfused 1 unit PRBCs for hemoglobin of 7.2 on 08/06/2021 and received infusion of IV Feraheme 510 mg x 1.  Repeat  hemoglobin 8.7 on 08/07/2021.  Seen by GI, post EGD and colonoscopy on 08/07/2021 by [facility], Dr. Therisa Doyne.  EGD revealed gastric ulcers and duodenal ulcer, nonbleeding.  Colonoscopy revealed diverticulosis but no perforation or abscess, polyps, internal hemorrhoids.  08/09/2021: Seen at his bedside.  He has no new complaints.  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 Sharol Given. CTA abdomen obtained by vascular surgery significant for right superficial femoral stenosis of high degree measuring 60% in addition to multifocal high-grade stenosis of left superficial femoral artery. Right transtibial amputation on 08/03/2021 by orthopedic surgery, Dr. Sharol Given. Received IV clindamycin, zosyn, and IV vancomycin perioperatively Completed course of antibiotics. Afebrile, no leukocytosis.  Nonseptic appearing. Seen by orthopedic surgery, okay to discharge to SNF. Will need to follow-up with orthopedic surgery outpatient. Pain management as needed with bowel regimen as needed.   Acute blood loss anemia postsurgery in the setting of chronic iron deficiency anemia, baseline hemoglobin 12 FOBT positive on 08/06/2021. He has received total of 2 units PRBC transfusion during this admission. Last hemoglobin 8.7 on 08/07/2021. Significant iron deficiency on iron studies done on 08/03/2021 He is on iron supplement every other  with active ulcer and abscess again seen. 2. Extensive distal colonic diverticulosis without evidence of acute diverticulitis. Electronically Signed   By: Miachel Roux M.D.   On: 08/02/2021 09:11   CT FOOT RIGHT W CONTRAST  Result Date: 08/01/2021 CLINICAL DATA:  Right foot infection. EXAM: CT OF THE LOWER RIGHT EXTREMITY WITH CONTRAST TECHNIQUE: Multidetector CT imaging of the lower right extremity was performed according to the standard protocol following intravenous contrast administration. CONTRAST:  62mL OMNIPAQUE IOHEXOL 350  MG/ML SOLN COMPARISON:  Right foot x-rays from same day. CT right foot dated Mar 23, 2017. FINDINGS: Bones/Joint/Cartilage No bony destruction. Unchanged chronic periosteal reaction of the distal tibia and fibula. 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 Similar subtalar and Chopart joint ankylosis. Osteopenia. No joint effusion. Ligaments Ligaments are suboptimally evaluated by CT. Muscles and Tendons Severely atrophy. Soft tissue Progressive deep soft tissue ulceration along the lateral and plantar aspect of the hindfoot. Prominent phlegmonous change around the ulceration without well-defined drainable fluid collection. A small amount of subcutaneous emphysema tracks superiorly into the superficial posterolateral distal lower leg. Diffuse skin thickening and soft tissue swelling. No soft tissue mass. IMPRESSION: 1. Progressive deep soft tissue ulceration along the lateral and plantar aspect of the hindfoot with surrounding prominent phlegmonous change but without well-defined drainable fluid collection. Small amount of subcutaneous emphysema tracks superiorly into the superficial posterolateral distal lower leg, most likely associated with the patient's wound rather than necrotizing infection. 2. No CT evidence of acute osteomyelitis. 3. 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 Electronically Signed   By: Titus Dubin M.D.   On: 08/01/2021 14:06   DG Foot Complete Right  Result Date: 08/01/2021 CLINICAL DATA:  Purulent drainage from RIGHT foot wound, increased swelling and redness; told at [facility] to go to ED for RIGHT foot infection, history of chronic osteomyelitis EXAM: RIGHT FOOT COMPLETE - 3+ VIEW COMPARISON:  04/05/2021 FINDINGS: Osseous demineralization. Marked soft tissue swelling. Foci of soft tissue gas are identified at the posterior aspect of the RIGHT ankle extending into plantar  aspect of foot laterally. Findings consistent with either penetrating wound or soft tissue infection by gas-forming organism. Extensive degenerative changes deformity throughout the tarsals to MTP joints. Bone destruction at proximal RIGHT fifth meta tarsal and distal lateral cuboid again seen. Nonunion of old fracture at lateral base of proximal phalanx great toe. No acute fracture or bone destruction definitely visualized. IMPRESSION: Extensive soft tissue swelling with foci of soft tissue gas at the lateral heel and posterior ankle either representing penetrating wound or infection by a gas-forming organism. Extensive degenerative changes and deformities throughout RIGHT foot similar to previous exam. No definite acute fracture or new bone destruction identified. Findings called to [facility] on 08/01/2021 at 1104 hours. Electronically Signed   By: Lavonia Dana M.D.   On: 08/01/2021 10:53   VAS [place] ABI WITH/WO TBI  Result Date: 08/01/2021  LOWER EXTREMITY DOPPLER STUDY [name]  Date of Exam:   08/01/2021 Medical Rec #: [id number]     Accession #:    [id number] Date of Birth: 1951-05-17    Patient Gender: M Patient Age:   70 years Exam Location:  [facility] Procedure:      VAS [place] ABI WITH/WO TBI Referring Phys: Cherylann Ratel --------------------------------------------------------------------------------  Indications: Ulceration. High Risk Factors: Diabetes.  Comparison Study: No prior studies. Performing Technologist: Carlos Levering RVT  Examination  Progress note  Nathan Miller JKD:[id number] DOB: 12-18-1950  PCP: [place], [place] date: 08/01/2021 Discharge date: 08/09/2021  Time spent: 35 minutes.  Recommendations for Outpatient Follow-up:  Follow-up with orthopedic surgery. Follow-up with GI. A high-fiber diet has been recommended by your gastroenterologist. Take your medications as prescribed Continue PT OT with assistance and fall precautions.  Discharge Diagnoses:  Active Hospital Problems   Diagnosis Date Noted   Diabetic foot infection ([place]) 08/01/2021   Obesity with body mass index (BMI) of 30.0 to 39.9 03/31/2021   Diabetes type 2, controlled ([place]) 01/29/2016   Complete heart block ([place]) 01/21/2015   Right foot ulcer ([place]) 01/21/2015   Essential hypertension 05/10/2011   Subacute osteomyelitis, right ankle and foot [facility])     Resolved Hospital Problems  No resolved problems to display.    Discharge Condition: Stable  Diet recommendation: High-fiber diet.  Vitals:   08/09/21 0757 08/09/21 0828  BP: (!) 170/64   Pulse: (!) 50   Resp: 17   Temp: 98.3 F (36.8 C)   SpO2: 98% 98%    History of present illness:  Nathan Miller is a 70 y.o. male with a history of CAD, type 2 diabetes, chronic lymphedema, complete heart block s/p PPM, chronic right foot wound followed by wound care. Presented to the hospital due to purulent drainage from right foot with concern for acute infection.  Upon presentation to the ED, he received IV antibiotics empirically, IV vancomycin, Zosyn, and IV clindamycin.  He was seen by orthopedic surgery, Dr. Sharol Given.  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  Was evaluated by PT OT with recommendation for SNF.    Hospital course complicated by acute blood loss anemia with significant iron deficiency, and positive FOBT.  He was transfused 1 unit PRBCs for hemoglobin of 7.2 on 08/06/2021 and received infusion of IV Feraheme 510 mg x 1.  Repeat  hemoglobin 8.7 on 08/07/2021.  Seen by GI, post EGD and colonoscopy on 08/07/2021 by [facility], Dr. Therisa Doyne.  EGD revealed gastric ulcers and duodenal ulcer, nonbleeding.  Colonoscopy revealed diverticulosis but no perforation or abscess, polyps, internal hemorrhoids.  08/09/2021: Seen at his bedside.  He has no new complaints.  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 Sharol Given. CTA abdomen obtained by vascular surgery significant for right superficial femoral stenosis of high degree measuring 60% in addition to multifocal high-grade stenosis of left superficial femoral artery. Right transtibial amputation on 08/03/2021 by orthopedic surgery, Dr. Sharol Given. Received IV clindamycin, zosyn, and IV vancomycin perioperatively Completed course of antibiotics. Afebrile, no leukocytosis.  Nonseptic appearing. Seen by orthopedic surgery, okay to discharge to SNF. Will need to follow-up with orthopedic surgery outpatient. Pain management as needed with bowel regimen as needed.   Acute blood loss anemia postsurgery in the setting of chronic iron deficiency anemia, baseline hemoglobin 12 FOBT positive on 08/06/2021. He has received total of 2 units PRBC transfusion during this admission. Last hemoglobin 8.7 on 08/07/2021. Significant iron deficiency on iron studies done on 08/03/2021 He is on iron supplement every other  Progress note  Nathan Miller JKD:[id number] DOB: 12-18-1950  PCP: [place], [place] date: 08/01/2021 Discharge date: 08/09/2021  Time spent: 35 minutes.  Recommendations for Outpatient Follow-up:  Follow-up with orthopedic surgery. Follow-up with GI. A high-fiber diet has been recommended by your gastroenterologist. Take your medications as prescribed Continue PT OT with assistance and fall precautions.  Discharge Diagnoses:  Active Hospital Problems   Diagnosis Date Noted   Diabetic foot infection ([place]) 08/01/2021   Obesity with body mass index (BMI) of 30.0 to 39.9 03/31/2021   Diabetes type 2, controlled ([place]) 01/29/2016   Complete heart block ([place]) 01/21/2015   Right foot ulcer ([place]) 01/21/2015   Essential hypertension 05/10/2011   Subacute osteomyelitis, right ankle and foot [facility])     Resolved Hospital Problems  No resolved problems to display.    Discharge Condition: Stable  Diet recommendation: High-fiber diet.  Vitals:   08/09/21 0757 08/09/21 0828  BP: (!) 170/64   Pulse: (!) 50   Resp: 17   Temp: 98.3 F (36.8 C)   SpO2: 98% 98%    History of present illness:  Nathan Miller is a 70 y.o. male with a history of CAD, type 2 diabetes, chronic lymphedema, complete heart block s/p PPM, chronic right foot wound followed by wound care. Presented to the hospital due to purulent drainage from right foot with concern for acute infection.  Upon presentation to the ED, he received IV antibiotics empirically, IV vancomycin, Zosyn, and IV clindamycin.  He was seen by orthopedic surgery, Dr. Sharol Given.  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  Was evaluated by PT OT with recommendation for SNF.    Hospital course complicated by acute blood loss anemia with significant iron deficiency, and positive FOBT.  He was transfused 1 unit PRBCs for hemoglobin of 7.2 on 08/06/2021 and received infusion of IV Feraheme 510 mg x 1.  Repeat  hemoglobin 8.7 on 08/07/2021.  Seen by GI, post EGD and colonoscopy on 08/07/2021 by [facility], Dr. Therisa Doyne.  EGD revealed gastric ulcers and duodenal ulcer, nonbleeding.  Colonoscopy revealed diverticulosis but no perforation or abscess, polyps, internal hemorrhoids.  08/09/2021: Seen at his bedside.  He has no new complaints.  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 Sharol Given. CTA abdomen obtained by vascular surgery significant for right superficial femoral stenosis of high degree measuring 60% in addition to multifocal high-grade stenosis of left superficial femoral artery. Right transtibial amputation on 08/03/2021 by orthopedic surgery, Dr. Sharol Given. Received IV clindamycin, zosyn, and IV vancomycin perioperatively Completed course of antibiotics. Afebrile, no leukocytosis.  Nonseptic appearing. Seen by orthopedic surgery, okay to discharge to SNF. Will need to follow-up with orthopedic surgery outpatient. Pain management as needed with bowel regimen as needed.   Acute blood loss anemia postsurgery in the setting of chronic iron deficiency anemia, baseline hemoglobin 12 FOBT positive on 08/06/2021. He has received total of 2 units PRBC transfusion during this admission. Last hemoglobin 8.7 on 08/07/2021. Significant iron deficiency on iron studies done on 08/03/2021 He is on iron supplement every other  with active ulcer and abscess again seen. 2. Extensive distal colonic diverticulosis without evidence of acute diverticulitis. Electronically Signed   By: Miachel Roux M.D.   On: 08/02/2021 09:11   CT FOOT RIGHT W CONTRAST  Result Date: 08/01/2021 CLINICAL DATA:  Right foot infection. EXAM: CT OF THE LOWER RIGHT EXTREMITY WITH CONTRAST TECHNIQUE: Multidetector CT imaging of the lower right extremity was performed according to the standard protocol following intravenous contrast administration. CONTRAST:  62mL OMNIPAQUE IOHEXOL 350  MG/ML SOLN COMPARISON:  Right foot x-rays from same day. CT right foot dated Mar 23, 2017. FINDINGS: Bones/Joint/Cartilage No bony destruction. Unchanged chronic periosteal reaction of the distal tibia and fibula. 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 Similar subtalar and Chopart joint ankylosis. Osteopenia. No joint effusion. Ligaments Ligaments are suboptimally evaluated by CT. Muscles and Tendons Severely atrophy. Soft tissue Progressive deep soft tissue ulceration along the lateral and plantar aspect of the hindfoot. Prominent phlegmonous change around the ulceration without well-defined drainable fluid collection. A small amount of subcutaneous emphysema tracks superiorly into the superficial posterolateral distal lower leg. Diffuse skin thickening and soft tissue swelling. No soft tissue mass. IMPRESSION: 1. Progressive deep soft tissue ulceration along the lateral and plantar aspect of the hindfoot with surrounding prominent phlegmonous change but without well-defined drainable fluid collection. Small amount of subcutaneous emphysema tracks superiorly into the superficial posterolateral distal lower leg, most likely associated with the patient's wound rather than necrotizing infection. 2. No CT evidence of acute osteomyelitis. 3. 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 Electronically Signed   By: Titus Dubin M.D.   On: 08/01/2021 14:06   DG Foot Complete Right  Result Date: 08/01/2021 CLINICAL DATA:  Purulent drainage from RIGHT foot wound, increased swelling and redness; told at [facility] to go to ED for RIGHT foot infection, history of chronic osteomyelitis EXAM: RIGHT FOOT COMPLETE - 3+ VIEW COMPARISON:  04/05/2021 FINDINGS: Osseous demineralization. Marked soft tissue swelling. Foci of soft tissue gas are identified at the posterior aspect of the RIGHT ankle extending into plantar  aspect of foot laterally. Findings consistent with either penetrating wound or soft tissue infection by gas-forming organism. Extensive degenerative changes deformity throughout the tarsals to MTP joints. Bone destruction at proximal RIGHT fifth meta tarsal and distal lateral cuboid again seen. Nonunion of old fracture at lateral base of proximal phalanx great toe. No acute fracture or bone destruction definitely visualized. IMPRESSION: Extensive soft tissue swelling with foci of soft tissue gas at the lateral heel and posterior ankle either representing penetrating wound or infection by a gas-forming organism. Extensive degenerative changes and deformities throughout RIGHT foot similar to previous exam. No definite acute fracture or new bone destruction identified. Findings called to [facility] on 08/01/2021 at 1104 hours. Electronically Signed   By: Lavonia Dana M.D.   On: 08/01/2021 10:53   VAS [place] ABI WITH/WO TBI  Result Date: 08/01/2021  LOWER EXTREMITY DOPPLER STUDY [name]  Date of Exam:   08/01/2021 Medical Rec #: [id number]     Accession #:    [id number] Date of Birth: 1951-05-17    Patient Gender: M Patient Age:   70 years Exam Location:  [facility] Procedure:      VAS [place] ABI WITH/WO TBI Referring Phys: Cherylann Ratel --------------------------------------------------------------------------------  Indications: Ulceration. High Risk Factors: Diabetes.  Comparison Study: No prior studies. Performing Technologist: Carlos Levering RVT  Examination  Progress note  Nathan Miller JKD:[id number] DOB: 12-18-1950  PCP: [place], [place] date: 08/01/2021 Discharge date: 08/09/2021  Time spent: 35 minutes.  Recommendations for Outpatient Follow-up:  Follow-up with orthopedic surgery. Follow-up with GI. A high-fiber diet has been recommended by your gastroenterologist. Take your medications as prescribed Continue PT OT with assistance and fall precautions.  Discharge Diagnoses:  Active Hospital Problems   Diagnosis Date Noted   Diabetic foot infection ([place]) 08/01/2021   Obesity with body mass index (BMI) of 30.0 to 39.9 03/31/2021   Diabetes type 2, controlled ([place]) 01/29/2016   Complete heart block ([place]) 01/21/2015   Right foot ulcer ([place]) 01/21/2015   Essential hypertension 05/10/2011   Subacute osteomyelitis, right ankle and foot [facility])     Resolved Hospital Problems  No resolved problems to display.    Discharge Condition: Stable  Diet recommendation: High-fiber diet.  Vitals:   08/09/21 0757 08/09/21 0828  BP: (!) 170/64   Pulse: (!) 50   Resp: 17   Temp: 98.3 F (36.8 C)   SpO2: 98% 98%    History of present illness:  Nathan Miller is a 70 y.o. male with a history of CAD, type 2 diabetes, chronic lymphedema, complete heart block s/p PPM, chronic right foot wound followed by wound care. Presented to the hospital due to purulent drainage from right foot with concern for acute infection.  Upon presentation to the ED, he received IV antibiotics empirically, IV vancomycin, Zosyn, and IV clindamycin.  He was seen by orthopedic surgery, Dr. Sharol Given.  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  Was evaluated by PT OT with recommendation for SNF.    Hospital course complicated by acute blood loss anemia with significant iron deficiency, and positive FOBT.  He was transfused 1 unit PRBCs for hemoglobin of 7.2 on 08/06/2021 and received infusion of IV Feraheme 510 mg x 1.  Repeat  hemoglobin 8.7 on 08/07/2021.  Seen by GI, post EGD and colonoscopy on 08/07/2021 by [facility], Dr. Therisa Doyne.  EGD revealed gastric ulcers and duodenal ulcer, nonbleeding.  Colonoscopy revealed diverticulosis but no perforation or abscess, polyps, internal hemorrhoids.  08/09/2021: Seen at his bedside.  He has no new complaints.  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 Sharol Given. CTA abdomen obtained by vascular surgery significant for right superficial femoral stenosis of high degree measuring 60% in addition to multifocal high-grade stenosis of left superficial femoral artery. Right transtibial amputation on 08/03/2021 by orthopedic surgery, Dr. Sharol Given. Received IV clindamycin, zosyn, and IV vancomycin perioperatively Completed course of antibiotics. Afebrile, no leukocytosis.  Nonseptic appearing. Seen by orthopedic surgery, okay to discharge to SNF. Will need to follow-up with orthopedic surgery outpatient. Pain management as needed with bowel regimen as needed.   Acute blood loss anemia postsurgery in the setting of chronic iron deficiency anemia, baseline hemoglobin 12 FOBT positive on 08/06/2021. He has received total of 2 units PRBC transfusion during this admission. Last hemoglobin 8.7 on 08/07/2021. Significant iron deficiency on iron studies done on 08/03/2021 He is on iron supplement every other

## 2021-08-09 NOTE — Progress Notes (Deleted)
Nathan Miller    [id number]    1951/09/26  Primary Care Physician:[place]  Referring Physician: Roger Kill, PA-C [address] [place],  [place] [address] Reason for Consultation: *** Date of Consultation: 08/09/2021  Chief complaint:  No chief complaint on file.    HPI:  *** Social history:  Occupation: Exposures: Smoking history:  Social History   Occupational History   Not on file  Tobacco Use   Smoking status: Former    Packs/day: 0.30    Years: 40.00    Pack years: 12.00    Types: Cigarettes    Start date: 10/16/1968    Quit date: 10/17/2007    Years since quitting: 13.8   Smokeless tobacco: Current    Types: Snuff  Vaping Use   Vaping Use: Never used  Substance and Sexual Activity   Alcohol use: Yes    Alcohol/week: 42.0 standard drinks    Types: 42 Cans of beer per week    Comment: beer   Drug use: No   Sexual activity: Not on file    Relevant family history: *** Family History  Problem Relation Age of Onset   Heart disease Father    COPD Father    Heart disease Paternal Grandfather     Past Medical History:  Diagnosis Date   CAD (coronary artery disease)    NSTEMI in 2001. Cardiac cath showed: LAD: 40% proximal, LCX: 70% mid, RCA thrombotic occlusion in mid segment. s/p PCI and 2 overlapped BMSs to RCA.    CHF (congestive heart failure) (HCC)    Diabetes mellitus    Hypertension    Osteomyelitis (HCC)    Presence of permanent cardiac pacemaker     Past Surgical History:  Procedure Laterality Date   AMPUTATION Right 08/03/2021   Procedure: AMPUTATION BELOW KNEE;  Surgeon: Nadara Mustard, MD;  Location: [facility] OR;  Service: Orthopedics;  Laterality: Right;   BUBBLE STUDY  04/07/2021   Procedure: BUBBLE STUDY;  Surgeon: Parke Poisson, MD;  Location: [facility] ENDOSCOPY;  Service: Cardiology;;   CARDIAC CATHETERIZATION     CORONARY ANGIOPLASTY     PERMANENT PACEMAKER INSERTION N/A  01/22/2015   MDT Advisa pacemaker implanted for complete heart block by Dr Johney Frame   TEE WITHOUT CARDIOVERSION N/A 04/07/2021   Procedure: TRANSESOPHAGEAL ECHOCARDIOGRAM (TEE);  Surgeon: Parke Poisson, MD;  Location: [facility] ENDOSCOPY;  Service: Cardiology;  Laterality: N/A;   TEMPORARY PACEMAKER INSERTION N/A 01/21/2015   Procedure: TEMPORARY PACEMAKER INSERTION;  Surgeon: Marykay Lex, MD;  Location: [facility] CATH LAB;  Service: Cardiovascular;  Laterality: N/A;     Physical Exam: There were no vitals taken for this visit. Gen:      No acute distress ENT:  ***no nasal polyps, mucus membranes moist Lungs:    No increased respiratory effort, symmetric chest wall excursion, clear to auscultation bilaterally, ***no wheezes or crackles CV:         Regular rate and rhythm; no murmurs, rubs, or gallops.  No pedal edema Abd:      + bowel sounds; soft, non-tender; no distension MSK: no acute synovitis of DIP or PIP joints, no mechanics hands.  Skin:      Warm and dry; no rashes*** Neuro: normal speech, no focal facial asymmetry Psych: alert and oriented x3, normal mood and affect   Data Reviewed/Medical Decision Making:  Independent interpretation of tests: Imaging:  Review of patient's *** images  revealed ***. The patient's images have been independently reviewed by me.    PFTs: I have personally reviewed the patient's PFTs and *** No flowsheet data found.  Labs: ***  Immunization status:  Immunization History  Administered Date(s) Administered   Influenza, High Dose Seasonal PF 07/21/2019, 10/12/2020   Influenza, Quadrivalent, Recombinant, Inj, Pf 08/07/2017   PFIZER(Purple Top)SARS-COV-2 Vaccination 06/28/2020, 07/19/2020   Pneumococcal Polysaccharide-23 04/27/2020     I reviewed prior external note(s) from ***  I reviewed the result(s) of the labs and imaging as noted above.   I have ordered ***  Discussion of management or test interpretation with another colleague ***.    Assessment:  *** 1 or more chronic illnesses with severe exacerbation, progression, or side effects of treatment;  *** 1 acute or chronic illness or injury that poses a threat to life or bodily function  Plan/Recommendations:   We discussed disease management and progression at length today.   I spent *** minutes in the care of this patient today including pre-charting, chart review, review of results, face-to-face care, coordination of care and communication with consultants etc.).  99202  15-29 minutes or Straightforward MDM  13244 30-44 minutes or Low level MDM  01027 45-59 minutes or Moderate level MDM  25366 60-74 minutes or High level MDM   Return to Care: No follow-ups on file.  Durel Salts, MD Pulmonary and Critical Care Medicine [facility] Office:[phone]  CC: Roger Kill, *

## 2021-08-16 ENCOUNTER — Ambulatory Visit (INDEPENDENT_AMBULATORY_CARE_PROVIDER_SITE_OTHER): Payer: PPO | Admitting: Family

## 2021-08-16 ENCOUNTER — Encounter: Payer: Self-pay | Admitting: Family

## 2021-08-16 DIAGNOSIS — S88111A Complete traumatic amputation at level between knee and ankle, right lower leg, initial encounter: Secondary | ICD-10-CM

## 2021-08-16 NOTE — Progress Notes (Signed)
Post-Op Visit Note   Patient: Nathan Miller           Date of Birth: [birth year]           MRN: [id number] Visit Date: 08/16/2021 PCP: [facility]  Chief Complaint:  Chief Complaint  Patient presents with   Right Knee - Routine Post Op, Follow-up    Right BKA on 08/03/21    HPI:  HPI Is a 70 year old gentleman seen status post right below-knee amputation on October 5.  In a wheelchair for mobility.  Ortho Exam On examination of the right residual limb this is healing well is dry staples are in place there is no gaping drainage no erythema consolidating well  Visit Diagnoses:  1. Below-knee amputation of right lower extremity (HCC)     Plan: Staples harvested today.  Continue daily dose of cleansing.  Dry dressing changes.  Shrinker around-the-clock follow-up in the office in 4 weeks.  Given an order for his prosthesis set up to [employer] per his request  Follow-Up Instructions: No follow-ups on file.   Imaging: No results found.  Orders:  No orders of the defined types were placed in this encounter.  No orders of the defined types were placed in this encounter.    PMFS History: Patient Active Problem List   Diagnosis Date Noted   Diabetic foot infection (HCC) 08/01/2021   Bacteremia 04/20/2021   Acute respiratory failure with hypoxia (HCC) 03/31/2021   Obesity with body mass index (BMI) of 30.0 to 39.9 03/31/2021   Elevated d-dimer 03/31/2021   Elevated brain natriuretic peptide (BNP) level 03/31/2021   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   Umbilical hernia 01/29/2016   Obesity 01/29/2016   Marijuana use 01/29/2016   Diabetes type 2, controlled (HCC) 01/29/2016   Diverticulitis of intestine with perforation without bleeding    Blood poisoning    Diverticulitis of intestine with perforation 06/13/2015   Diverticulitis 06/13/2015   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   Faintness    Bradycardia 01/21/2015   Syncope  01/21/2015   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   Right foot ulcer (HCC) 01/21/2015   Diabetes mellitus type 2, uncontrolled 01/21/2015   Hyperlipidemia LDL goal < 100 02/11/2013   Tobacco use disorder 02/11/2013   Anxiety state, unspecified 02/11/2013   Essential hypertension 05/10/2011   Papule 03/20/2011   Subacute osteomyelitis, right ankle and foot (HCC)    CAD S/P percutaneous coronary angioplasty; bms X 2 to RCA 2001 01/22/2000    Class: Diagnosis of   Past Medical History:  Diagnosis Date   CAD (coronary artery disease)    NSTEMI in 2001. Cardiac cath showed: LAD: 40% proximal, LCX: 70% mid, RCA thrombotic occlusion in mid segment. s/p PCI and 2 overlapped BMSs to RCA.    CHF (congestive heart failure) (HCC)    Diabetes mellitus    Hypertension    Osteomyelitis (HCC)    Presence of permanent cardiac pacemaker     Family History  Problem Relation Age of Onset   Heart disease Father    COPD Father    Heart disease Paternal Grandfather     Past Surgical History:  Procedure Laterality Date   AMPUTATION Right 08/03/2021   Procedure: AMPUTATION BELOW KNEE;  Surgeon: Nadara Mustard, MD;  Location: [facility] OR;  Service: Orthopedics;  Laterality: Right;   BIOPSY  08/07/2021   Procedure: BIOPSY;  Surgeon: Kerin Salen, MD;  Location: [facility] ENDOSCOPY;  Service: Gastroenterology;;   Thressa Sheller  STUDY  04/07/2021   Procedure: BUBBLE STUDY;  Surgeon: Parke Poisson, MD;  Location: [facility] ENDOSCOPY;  Service: Cardiology;;   CARDIAC CATHETERIZATION     COLONOSCOPY N/A 08/07/2021   Procedure: COLONOSCOPY;  Surgeon: Kerin Salen, MD;  Location: [facility] ENDOSCOPY;  Service: Gastroenterology;  Laterality: N/A;   CORONARY ANGIOPLASTY     ESOPHAGOGASTRODUODENOSCOPY (EGD) WITH PROPOFOL N/A 08/07/2021   Procedure: ESOPHAGOGASTRODUODENOSCOPY (EGD) WITH PROPOFOL;  Surgeon: Kerin Salen, MD;  Location: [facility] ENDOSCOPY;  Service: Gastroenterology;  Laterality: N/A;   PERMANENT PACEMAKER INSERTION N/A 01/22/2015   MDT  Advisa pacemaker implanted for complete heart block by Dr Johney Frame   POLYPECTOMY  08/07/2021   Procedure: POLYPECTOMY;  Surgeon: Kerin Salen, MD;  Location: [facility] ENDOSCOPY;  Service: Gastroenterology;;   TEE WITHOUT CARDIOVERSION N/A 04/07/2021   Procedure: TRANSESOPHAGEAL ECHOCARDIOGRAM (TEE);  Surgeon: Parke Poisson, MD;  Location: [facility] ENDOSCOPY;  Service: Cardiology;  Laterality: N/A;   TEMPORARY PACEMAKER INSERTION N/A 01/21/2015   Procedure: TEMPORARY PACEMAKER INSERTION;  Surgeon: Marykay Lex, MD;  Location: [facility] CATH LAB;  Service: Cardiovascular;  Laterality: N/A;   Social History   Occupational History   Not on file  Tobacco Use   Smoking status: Former    Packs/day: 0.30    Years: 40.00    Pack years: 12.00    Types: Cigarettes    Start date: 10/16/1968    Quit date: 10/17/2007    Years since quitting: 13.8   Smokeless tobacco: Current    Types: Snuff  Vaping Use   Vaping Use: Never used  Substance and Sexual Activity   Alcohol use: Yes    Alcohol/week: 42.0 standard drinks    Types: 42 Cans of beer per week    Comment: beer   Drug use: No   Sexual activity: Not on file

## 2021-08-17 DIAGNOSIS — I509 Heart failure, unspecified: Secondary | ICD-10-CM | POA: Diagnosis not present

## 2021-08-17 DIAGNOSIS — E871 Hypo-osmolality and hyponatremia: Secondary | ICD-10-CM | POA: Diagnosis not present

## 2021-08-17 DIAGNOSIS — R0902 Hypoxemia: Secondary | ICD-10-CM | POA: Diagnosis not present

## 2021-08-23 ENCOUNTER — Encounter (HOSPITAL_BASED_OUTPATIENT_CLINIC_OR_DEPARTMENT_OTHER): Payer: PPO | Admitting: Internal Medicine

## 2021-08-26 ENCOUNTER — Ambulatory Visit: Payer: PPO | Admitting: Family Medicine

## 2021-08-26 DIAGNOSIS — R52 Pain, unspecified: Secondary | ICD-10-CM | POA: Diagnosis not present

## 2021-08-26 DIAGNOSIS — D62 Acute posthemorrhagic anemia: Secondary | ICD-10-CM | POA: Diagnosis not present

## 2021-08-26 DIAGNOSIS — Z89511 Acquired absence of right leg below knee: Secondary | ICD-10-CM | POA: Diagnosis not present

## 2021-08-26 DIAGNOSIS — L089 Local infection of the skin and subcutaneous tissue, unspecified: Secondary | ICD-10-CM | POA: Diagnosis not present

## 2021-08-28 DIAGNOSIS — Z9181 History of falling: Secondary | ICD-10-CM | POA: Diagnosis not present

## 2021-08-28 DIAGNOSIS — I442 Atrioventricular block, complete: Secondary | ICD-10-CM | POA: Diagnosis not present

## 2021-08-28 DIAGNOSIS — I959 Hypotension, unspecified: Secondary | ICD-10-CM | POA: Diagnosis not present

## 2021-08-28 DIAGNOSIS — I2510 Atherosclerotic heart disease of native coronary artery without angina pectoris: Secondary | ICD-10-CM | POA: Diagnosis not present

## 2021-08-28 DIAGNOSIS — E11621 Type 2 diabetes mellitus with foot ulcer: Secondary | ICD-10-CM | POA: Diagnosis not present

## 2021-08-28 DIAGNOSIS — Z7982 Long term (current) use of aspirin: Secondary | ICD-10-CM | POA: Diagnosis not present

## 2021-08-28 DIAGNOSIS — L97415 Non-pressure chronic ulcer of right heel and midfoot with muscle involvement without evidence of necrosis: Secondary | ICD-10-CM | POA: Diagnosis not present

## 2021-08-28 DIAGNOSIS — B952 Enterococcus as the cause of diseases classified elsewhere: Secondary | ICD-10-CM | POA: Diagnosis not present

## 2021-08-28 DIAGNOSIS — E871 Hypo-osmolality and hyponatremia: Secondary | ICD-10-CM | POA: Diagnosis not present

## 2021-08-28 DIAGNOSIS — I5033 Acute on chronic diastolic (congestive) heart failure: Secondary | ICD-10-CM | POA: Diagnosis not present

## 2021-08-28 DIAGNOSIS — I110 Hypertensive heart disease with heart failure: Secondary | ICD-10-CM | POA: Diagnosis not present

## 2021-08-28 DIAGNOSIS — Z87891 Personal history of nicotine dependence: Secondary | ICD-10-CM | POA: Diagnosis not present

## 2021-08-28 DIAGNOSIS — E11610 Type 2 diabetes mellitus with diabetic neuropathic arthropathy: Secondary | ICD-10-CM | POA: Diagnosis not present

## 2021-08-28 DIAGNOSIS — B9561 Methicillin susceptible Staphylococcus aureus infection as the cause of diseases classified elsewhere: Secondary | ICD-10-CM | POA: Diagnosis not present

## 2021-08-28 DIAGNOSIS — M869 Osteomyelitis, unspecified: Secondary | ICD-10-CM | POA: Diagnosis not present

## 2021-08-28 DIAGNOSIS — Z9981 Dependence on supplemental oxygen: Secondary | ICD-10-CM | POA: Diagnosis not present

## 2021-08-28 DIAGNOSIS — Z7984 Long term (current) use of oral hypoglycemic drugs: Secondary | ICD-10-CM | POA: Diagnosis not present

## 2021-08-28 DIAGNOSIS — J9621 Acute and chronic respiratory failure with hypoxia: Secondary | ICD-10-CM | POA: Diagnosis not present

## 2021-08-28 DIAGNOSIS — Z6838 Body mass index (BMI) 38.0-38.9, adult: Secondary | ICD-10-CM | POA: Diagnosis not present

## 2021-08-28 DIAGNOSIS — D649 Anemia, unspecified: Secondary | ICD-10-CM | POA: Diagnosis not present

## 2021-08-28 DIAGNOSIS — K5792 Diverticulitis of intestine, part unspecified, without perforation or abscess without bleeding: Secondary | ICD-10-CM | POA: Diagnosis not present

## 2021-08-28 DIAGNOSIS — I890 Lymphedema, not elsewhere classified: Secondary | ICD-10-CM | POA: Diagnosis not present

## 2021-08-28 DIAGNOSIS — E1169 Type 2 diabetes mellitus with other specified complication: Secondary | ICD-10-CM | POA: Diagnosis not present

## 2021-08-28 DIAGNOSIS — Z950 Presence of cardiac pacemaker: Secondary | ICD-10-CM | POA: Diagnosis not present

## 2021-08-30 ENCOUNTER — Ambulatory Visit (INDEPENDENT_AMBULATORY_CARE_PROVIDER_SITE_OTHER): Payer: PPO | Admitting: Family

## 2021-08-30 ENCOUNTER — Other Ambulatory Visit: Payer: Self-pay

## 2021-08-30 DIAGNOSIS — Z89511 Acquired absence of right leg below knee: Secondary | ICD-10-CM

## 2021-08-30 NOTE — Progress Notes (Signed)
Post-Op Visit Note   Patient: Nathan Miller           Date of Birth: 18-Jun-1951           MRN: [id number] Visit Date: 08/30/2021 PCP: [facility]  Chief Complaint:  Chief Complaint  Patient presents with   Right Leg - Routine Post Op    Right BKA 08/03/21    HPI:  HPI The patient is a 70 year old gentleman seen status post right below-knee amputation October 5.  This is well-healed.  He has worked with biotech in the past and would like to remain with this office for his prosthesis set up.  Patient is a new right transtibial  amputee.  Patient's current comorbidities are not expected to impact the ability to function with the prescribed prosthesis. Patient verbally communicates a strong desire to use a prosthesis. Patient currently requires mobility aids to ambulate without a prosthesis.  Expects not to use mobility aids with a new prosthesis.  Patient is a K2 level ambulator that will use a prosthesis to walk around their home and the community over low level environmental barriers.      Ortho Exam On examination of right residual limb this is well-healed staples are in place these were harvested today without incident there is scant bloody drainage no gaping no erythema no sign of infection  Visit Diagnoses: No diagnosis found.  Plan: Continue daily Dial soap cleansing.  Shrinker with direct skin contact.  May call for prosthesis set up  Follow-Up Instructions: No follow-ups on file.   Imaging: No results found.  Orders:  No orders of the defined types were placed in this encounter.  No orders of the defined types were placed in this encounter.    PMFS History: Patient Active Problem List   Diagnosis Date Noted   Diabetic foot infection (HCC) 08/01/2021   Bacteremia 04/20/2021   Acute respiratory failure with hypoxia (HCC) 03/31/2021   Obesity with body mass index (BMI) of 30.0 to 39.9 03/31/2021   Elevated d-dimer 03/31/2021    Elevated brain natriuretic peptide (BNP) level 03/31/2021   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   Umbilical hernia 01/29/2016   Obesity 01/29/2016   Marijuana use 01/29/2016   Diabetes type 2, controlled (HCC) 01/29/2016   Diverticulitis of intestine with perforation without bleeding    Blood poisoning    Diverticulitis of intestine with perforation 06/13/2015   Diverticulitis 06/13/2015   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   Faintness    Bradycardia 01/21/2015   Syncope 01/21/2015   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   Right foot ulcer (HCC) 01/21/2015   Diabetes mellitus type 2, uncontrolled 01/21/2015   Hyperlipidemia LDL goal < 100 02/11/2013   Tobacco use disorder 02/11/2013   Anxiety state, unspecified 02/11/2013   Essential hypertension 05/10/2011   Papule 03/20/2011   Subacute osteomyelitis, right ankle and foot (HCC)    CAD S/P percutaneous coronary angioplasty; bms X 2 to RCA 2001 01/22/2000    Class: Diagnosis of   Past Medical History:  Diagnosis Date   CAD (coronary artery disease)    NSTEMI in 2001. Cardiac cath showed: LAD: 40% proximal, LCX: 70% mid, RCA thrombotic occlusion in mid segment. s/p PCI and 2 overlapped BMSs to RCA.    CHF (congestive heart failure) (HCC)    Diabetes mellitus    Hypertension    Osteomyelitis (HCC)    Presence of permanent cardiac pacemaker     Family History  Problem  Relation Age of Onset   Heart disease Father    COPD Father    Heart disease Paternal Grandfather     Past Surgical History:  Procedure Laterality Date   AMPUTATION Right 08/03/2021   Procedure: AMPUTATION BELOW KNEE;  Surgeon: Nadara Mustard, MD;  Location: [facility] OR;  Service: Orthopedics;  Laterality: Right;   BIOPSY  08/07/2021   Procedure: BIOPSY;  Surgeon: Kerin Salen, MD;  Location: [facility] ENDOSCOPY;  Service: Gastroenterology;;   BUBBLE STUDY  04/07/2021   Procedure: BUBBLE STUDY;  Surgeon: Parke Poisson, MD;  Location: [facility] ENDOSCOPY;  Service:  Cardiology;;   CARDIAC CATHETERIZATION     COLONOSCOPY N/A 08/07/2021   Procedure: COLONOSCOPY;  Surgeon: Kerin Salen, MD;  Location: [facility] ENDOSCOPY;  Service: Gastroenterology;  Laterality: N/A;   CORONARY ANGIOPLASTY     ESOPHAGOGASTRODUODENOSCOPY (EGD) WITH PROPOFOL N/A 08/07/2021   Procedure: ESOPHAGOGASTRODUODENOSCOPY (EGD) WITH PROPOFOL;  Surgeon: Kerin Salen, MD;  Location: [facility] ENDOSCOPY;  Service: Gastroenterology;  Laterality: N/A;   PERMANENT PACEMAKER INSERTION N/A 01/22/2015   MDT Advisa pacemaker implanted for complete heart block by Dr Johney Frame   POLYPECTOMY  08/07/2021   Procedure: POLYPECTOMY;  Surgeon: Kerin Salen, MD;  Location: [facility] ENDOSCOPY;  Service: Gastroenterology;;   TEE WITHOUT CARDIOVERSION N/A 04/07/2021   Procedure: TRANSESOPHAGEAL ECHOCARDIOGRAM (TEE);  Surgeon: Parke Poisson, MD;  Location: [facility] ENDOSCOPY;  Service: Cardiology;  Laterality: N/A;   TEMPORARY PACEMAKER INSERTION N/A 01/21/2015   Procedure: TEMPORARY PACEMAKER INSERTION;  Surgeon: Marykay Lex, MD;  Location: [facility] CATH LAB;  Service: Cardiovascular;  Laterality: N/A;   Social History   Occupational History   Not on file  Tobacco Use   Smoking status: Former    Packs/day: 0.30    Years: 40.00    Pack years: 12.00    Types: Cigarettes    Start date: 10/16/1968    Quit date: 10/17/2007    Years since quitting: 13.8   Smokeless tobacco: Current    Types: Snuff  Vaping Use   Vaping Use: Never used  Substance and Sexual Activity   Alcohol use: Yes    Alcohol/week: 42.0 standard drinks    Types: 42 Cans of beer per week    Comment: beer   Drug use: No   Sexual activity: Not on file

## 2021-09-01 DIAGNOSIS — B9561 Methicillin susceptible Staphylococcus aureus infection as the cause of diseases classified elsewhere: Secondary | ICD-10-CM | POA: Diagnosis not present

## 2021-09-01 DIAGNOSIS — I110 Hypertensive heart disease with heart failure: Secondary | ICD-10-CM | POA: Diagnosis not present

## 2021-09-01 DIAGNOSIS — I890 Lymphedema, not elsewhere classified: Secondary | ICD-10-CM | POA: Diagnosis not present

## 2021-09-01 DIAGNOSIS — L97415 Non-pressure chronic ulcer of right heel and midfoot with muscle involvement without evidence of necrosis: Secondary | ICD-10-CM | POA: Diagnosis not present

## 2021-09-01 DIAGNOSIS — I959 Hypotension, unspecified: Secondary | ICD-10-CM | POA: Diagnosis not present

## 2021-09-01 DIAGNOSIS — K5792 Diverticulitis of intestine, part unspecified, without perforation or abscess without bleeding: Secondary | ICD-10-CM | POA: Diagnosis not present

## 2021-09-01 DIAGNOSIS — Z87891 Personal history of nicotine dependence: Secondary | ICD-10-CM | POA: Diagnosis not present

## 2021-09-01 DIAGNOSIS — E871 Hypo-osmolality and hyponatremia: Secondary | ICD-10-CM | POA: Diagnosis not present

## 2021-09-01 DIAGNOSIS — I5033 Acute on chronic diastolic (congestive) heart failure: Secondary | ICD-10-CM | POA: Diagnosis not present

## 2021-09-01 DIAGNOSIS — Z9981 Dependence on supplemental oxygen: Secondary | ICD-10-CM | POA: Diagnosis not present

## 2021-09-01 DIAGNOSIS — Z6838 Body mass index (BMI) 38.0-38.9, adult: Secondary | ICD-10-CM | POA: Diagnosis not present

## 2021-09-01 DIAGNOSIS — B952 Enterococcus as the cause of diseases classified elsewhere: Secondary | ICD-10-CM | POA: Diagnosis not present

## 2021-09-01 DIAGNOSIS — D649 Anemia, unspecified: Secondary | ICD-10-CM | POA: Diagnosis not present

## 2021-09-01 DIAGNOSIS — I2510 Atherosclerotic heart disease of native coronary artery without angina pectoris: Secondary | ICD-10-CM | POA: Diagnosis not present

## 2021-09-01 DIAGNOSIS — E1169 Type 2 diabetes mellitus with other specified complication: Secondary | ICD-10-CM | POA: Diagnosis not present

## 2021-09-01 DIAGNOSIS — M869 Osteomyelitis, unspecified: Secondary | ICD-10-CM | POA: Diagnosis not present

## 2021-09-01 DIAGNOSIS — J9621 Acute and chronic respiratory failure with hypoxia: Secondary | ICD-10-CM | POA: Diagnosis not present

## 2021-09-01 DIAGNOSIS — E11610 Type 2 diabetes mellitus with diabetic neuropathic arthropathy: Secondary | ICD-10-CM | POA: Diagnosis not present

## 2021-09-01 DIAGNOSIS — Z9181 History of falling: Secondary | ICD-10-CM | POA: Diagnosis not present

## 2021-09-01 DIAGNOSIS — E11621 Type 2 diabetes mellitus with foot ulcer: Secondary | ICD-10-CM | POA: Diagnosis not present

## 2021-09-01 DIAGNOSIS — Z7984 Long term (current) use of oral hypoglycemic drugs: Secondary | ICD-10-CM | POA: Diagnosis not present

## 2021-09-01 DIAGNOSIS — Z950 Presence of cardiac pacemaker: Secondary | ICD-10-CM | POA: Diagnosis not present

## 2021-09-01 DIAGNOSIS — Z7982 Long term (current) use of aspirin: Secondary | ICD-10-CM | POA: Diagnosis not present

## 2021-09-01 DIAGNOSIS — I442 Atrioventricular block, complete: Secondary | ICD-10-CM | POA: Diagnosis not present

## 2021-09-01 NOTE — Progress Notes (Deleted)
Cardiology Office Note  Date: 09/01/2021   ID: XAO CORTOPASSI, DOB [birth year], MRN [id number]  PCP:  [facility]  Cardiologist:  Rollene Rotunda, MD Electrophysiologist:  Hillis Range, MD   Chief Complaint: Hospital follow-up  History of Present Illness: Nathan Miller is a 70 y.o. 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   Recent hospital admission 08/01/2021 for purulent drainage from right foot with concern for acute infection.  On presentation to ED he received antibiotics with IV vancomycin, Zosyn and clindamycin.  He was seen by orthopedic surgery.  He is status post right transtibial amputation on 08/03/2021.  Hospital course complicated by acute blood loss anemia with significant IDA and positive fecal occult blood test.  He was transfused 1 unit packed red blood cells for hemoglobin of 7.2 on 08/06/2021 as well as an infusion of IV Feraheme.  Repeat hemoglobin 8.7 on 08/07/2021.  He has been seen by GI post EGD and colonoscopy on 08/07/2021.  EGD due revealed gastric ulcers and duodenal ulcers without bleeding.  Colonoscopy revealed diverticulitis.  No perforation, abscess, polyps or hemorrhoids.  GI recommended Protonix 40 mg p.o. twice daily x60 days and to avoid NSAIDs.  Follow-up with GI outpatient.  CTA of the abdomen obtained by vascular surgery was significant for right superficial femoral stenosis of high degree measuring 60% in addition of multifocal high-grade stenosis of left superficial femoral artery.  He was continuing aspirin 81 mg daily and Lipitor 80 mg daily.  Blood pressure was stable and he was resuming home  regimen of antihypertensive.  He was continuing 2 L nasal cannula for COPD along with Spiriva and albuterol.  To continue maintaining O2 saturations greater than 90%.  Echocardiogram grade 3 diastolic dysfunction with normal EF.  He is continuing on torsemide 20 mg daily.  To continue strict I's and O's and daily weights.  She is still  Past Medical History:  Diagnosis Date   CAD (coronary artery disease)    NSTEMI in 2001. Cardiac cath showed: LAD: 40% proximal, LCX: 70% mid, RCA thrombotic occlusion in mid segment. s/p PCI and 2 overlapped BMSs to RCA.    CHF (congestive heart failure) (HCC)    Diabetes mellitus    Hypertension    Osteomyelitis (HCC)    Presence of permanent cardiac pacemaker     Past Surgical History:  Procedure Laterality Date   AMPUTATION Right 08/03/2021   Procedure: AMPUTATION BELOW KNEE;  Surgeon: Nadara Mustard, MD;  Location: [facility] OR;  Service: Orthopedics;  Laterality: Right;   BIOPSY  08/07/2021   Procedure: BIOPSY;  Surgeon: Kerin Salen, MD;  Location: [facility] ENDOSCOPY;  Service: Gastroenterology;;   BUBBLE STUDY  04/07/2021   Procedure: BUBBLE STUDY;  Surgeon: Parke Poisson, MD;  Location: [facility] ENDOSCOPY;  Service: Cardiology;;   CARDIAC CATHETERIZATION     COLONOSCOPY N/A 08/07/2021   Procedure: COLONOSCOPY;  Surgeon: Kerin Salen, MD;  Location: [facility] ENDOSCOPY;  Service: Gastroenterology;  Laterality: N/A;   CORONARY ANGIOPLASTY     ESOPHAGOGASTRODUODENOSCOPY (EGD) WITH PROPOFOL N/A 08/07/2021   Procedure: ESOPHAGOGASTRODUODENOSCOPY (EGD) WITH PROPOFOL;  Surgeon: Kerin Salen, MD;  Location: [facility] ENDOSCOPY;  Service: Gastroenterology;  Laterality: N/A;   PERMANENT PACEMAKER INSERTION  N/A 01/22/2015   MDT Advisa pacemaker implanted for complete heart block by Dr Johney Frame   POLYPECTOMY  08/07/2021   Procedure: POLYPECTOMY;  Surgeon: Kerin Salen, MD;  Location: [facility] ENDOSCOPY;  Service: Gastroenterology;;   TEE WITHOUT CARDIOVERSION N/A 04/07/2021   Procedure: TRANSESOPHAGEAL  ECHOCARDIOGRAM (TEE);  Surgeon: Parke Poisson, MD;  Location: [facility] ENDOSCOPY;  Service: Cardiology;  Laterality: N/A;   TEMPORARY PACEMAKER INSERTION N/A 01/21/2015   Procedure: TEMPORARY PACEMAKER INSERTION;  Surgeon: Marykay Lex, MD;  Location: [facility] CATH LAB;  Service: Cardiovascular;  Laterality: N/A;    Current Outpatient Medications  Medication Sig Dispense Refill   acetaminophen (TYLENOL) 500 MG tablet Take 500 mg by mouth every 6 (six) hours as needed for mild pain or fever.     albuterol (VENTOLIN HFA) 108 (90 Base) MCG/ACT inhaler Inhale 1-2 puffs into the lungs every 6 (six) hours as needed for wheezing or shortness of breath.     aspirin EC 81 MG tablet Take 1 tablet (81 mg total) by mouth daily. 30 tablet 0   atorvastatin (LIPITOR) 80 MG tablet Take 1 tablet (80 mg total) by mouth daily. 90 tablet 3   carvedilol (COREG) 25 MG tablet Take 1 tablet (25 mg total) by mouth 2 (two) times daily. 180 tablet 3   ferrous sulfate 325 (65 FE) MG tablet Take 1 tablet (325 mg total) by mouth daily with breakfast. 90 tablet 0   metFORMIN (GLUCOPHAGE) 500 MG tablet Take 1 tablet (500 mg total) by mouth 2 (two) times daily with a meal. 60 tablet 0   Multiple Vitamins-Minerals (CENTRUM ADULTS) TABS Take 1 tablet by mouth daily.     nitroGLYCERIN (NITROSTAT) 0.4 MG SL tablet Place 1 tablet (0.4 mg total) under the tongue every 5 (five) minutes as needed for chest pain. 25 tablet 3   pantoprazole (PROTONIX) 40 MG tablet Take 1 tablet (40 mg total) by mouth 2 (two) times daily. 60 tablet 1   [START ON 10/06/2021] pantoprazole (PROTONIX) 40 MG tablet Take 1 tablet (40 mg total) by mouth daily. 30 tablet 0   polyethylene glycol (MIRALAX) 17 g packet Take 17 g by mouth daily as needed. 14 each 0   sodium hypochlorite (DAKIN'S 1/2 STRENGTH) external solution Apply 1 application topically daily.     SPIRIVA RESPIMAT 2.5 MCG/ACT AERS Inhale 2 puffs into the lungs daily.     torsemide (DEMADEX) 20 MG tablet  Take 20 mg by mouth daily.     No current facility-administered medications for this visit.   Allergies:  Patient has no known allergies.   Social History: The patient  reports that he quit smoking about 13 years ago. His smoking use included cigarettes. He started smoking about 52 years ago. He has a 12.00 pack-year smoking history. His smokeless tobacco use includes snuff. He reports current alcohol use of about 42.0 standard drinks per week. He reports that he does not use drugs.   Family History: The patient's family history includes COPD in his father; Heart disease in his father and paternal grandfather.   ROS:  Please see the history of present illness. Otherwise, complete review of systems is positive for none.  All other systems are reviewed and negative.   Physical Exam: VS:  There were no vitals taken for this visit., BMI There is no height or weight on file to calculate BMI.  Wt Readings from Last 3 Encounters:  08/08/21 246 lb 11.1 oz (111.9 kg)  06/03/21 253 lb (114.8 kg)  04/20/21 261 lb (118.4 kg)    General: Patient appears comfortable at rest. HEENT: Conjunctiva and lids normal, oropharynx clear with moist mucosa. Neck: Supple, no elevated JVP or carotid bruits, no thyromegaly. Lungs: Clear to auscultation, nonlabored breathing at rest. Cardiac: Regular rate and rhythm, no S3 or significant systolic murmur, no pericardial rub. Abdomen: Soft, nontender, no hepatomegaly, bowel sounds present, no guarding or rebound. Extremities: No pitting edema, distal pulses 2+. Skin: Warm and dry. Musculoskeletal: No kyphosis. Neuropsychiatric: Alert and oriented x3, affect grossly appropriate.  ECG:  {EKG/Telemetry Strips Reviewed:[phone]}  Recent Labwork: 03/30/2021: B Natriuretic Peptide 503.2 04/08/2021: Magnesium 2.2 08/02/2021: ALT 14; AST 20 08/05/2021: BUN 7; Potassium 3.6; Sodium 135 08/07/2021: Hemoglobin 8.7; Platelets 287 08/09/2021: Creatinine, Ser 0.94  No results  found for: CHOL, TRIG, HDL, CHOLHDL, VLDL, LDLCALC, LDLDIRECT  Other Studies Reviewed Today:  CT angio of abdominal aorta with iliofemoral runoff bifemoral 08/02/2021  Vascular  1. Atherosclerotic changes of the right superficial femoral artery with high degree of stenosis measuring 60%. 2. Multifocal high-grade stenosis of the left superficial femoral artery. 3. Three-vessel runoff to the right ankle. Two vessel runoff to the left ankle.  Non Vascular   1. Advanced degenerative changes of the right foot with active ulcer and abscess again seen. 2. Extensive distal colonic diverticulosis without evidence of acute diverticulitis.    ABI 08/01/2021 Right: Resting right ankle-brachial index indicates moderate right lower  extremity arterial disease. The right toe-brachial index is abnormal.   Left: Resting left ankle-brachial index indicates mild left lower  extremity arterial disease. The left toe-brachial index is abnormal.     TEE 04/13/2021  1. Left ventricular ejection fraction, by estimation, is 55 to 60%. The  left ventricle has normal function.   2. Right ventricular systolic function is normal. The right ventricular  size is normal.   3. Left atrial size was mildly dilated. No left atrial/left atrial  appendage thrombus was detected.   4. The mitral valve is normal in structure. Trivial mitral valve  regurgitation.   5. Tricuspid valve regurgitation is mild to moderate.   6. The aortic valve is normal in structure. Aortic valve regurgitation is  trivial. No aortic stenosis is present.   7. There is Moderate (Grade III) atheroma plaque involving the transverse  and descending aorta.   8. Agitated saline contrast bubble study was positive with shunting  observed within 3-6 cardiac cycles suggestive of interatrial shunt. There  is a small patent foramen ovale with predominantly right to left shunting  across the atrial septum.   Conclusion(s)/Recommendation(s): No  evidence of vegetation/infective  endocarditis on this transesophageal echocardiogram.    Echocardiogram 03/31/2021 1. Left ventricular ejection fraction, by estimation, is 55 to 60%. The  left ventricle has normal function. Left ventricular endocardial border  not optimally defined to evaluate regional wall motion. There is severe  concentric left ventricular  hypertrophy. Left ventricular diastolic parameters are consistent with  Grade II diastolic dysfunction (pseudonormalization). Elevated left  ventricular end-diastolic pressure.   2. Right ventricular systolic function is normal. The right ventricular  size is normal. Tricuspid regurgitation signal is inadequate for assessing  PA pressure.   3. Left atrial size was mildly dilated.   4. Right atrial size was mildly dilated.   5. The mitral valve is normal in structure. No evidence of mitral valve  regurgitation. No evidence of mitral stenosis.   6. The aortic valve was not well visualized. Aortic valve regurgitation  is not visualized.  7. The inferior vena cava is normal in size with greater than 50%  respiratory variability, suggesting right atrial pressure of 3 mmHg.   Assessment and Plan:  1. CAD in native artery   2. Essential hypertension   3. Complete heart block (HCC)   4. Cardiac pacemaker in situ   5. PAD (peripheral artery disease) (HCC)   6. Tobacco abuse   7. Anemia, unspecified type    1. CAD in native artery ***  2. Essential hypertension ***  3. Complete heart block (HCC) ***  4. Cardiac pacemaker in situ ***  5. PAD (peripheral artery disease) (HCC) ***  6. Tobacco abuse ***  7. Anemia, unspecified type   Medication Adjustments/Labs and Tests Ordered: Current medicines are reviewed at length with the patient today.  Concerns regarding medicines are outlined above.   Disposition: Follow-up with ***  Signed, Rennis Harding, NP 09/01/2021 8:17 PM    [facility] at  [facility] [address] [place], [place], [place] [address] Phone: [phone]; Fax: [phone]

## 2021-09-02 ENCOUNTER — Ambulatory Visit: Payer: PPO | Admitting: Family Medicine

## 2021-09-02 ENCOUNTER — Telehealth: Payer: Self-pay | Admitting: Family Medicine

## 2021-09-02 DIAGNOSIS — I442 Atrioventricular block, complete: Secondary | ICD-10-CM

## 2021-09-02 DIAGNOSIS — Z720 Tobacco use: Secondary | ICD-10-CM

## 2021-09-02 DIAGNOSIS — I10 Essential (primary) hypertension: Secondary | ICD-10-CM

## 2021-09-02 DIAGNOSIS — D649 Anemia, unspecified: Secondary | ICD-10-CM

## 2021-09-02 DIAGNOSIS — I2510 Atherosclerotic heart disease of native coronary artery without angina pectoris: Secondary | ICD-10-CM

## 2021-09-02 DIAGNOSIS — I739 Peripheral vascular disease, unspecified: Secondary | ICD-10-CM

## 2021-09-02 DIAGNOSIS — Z950 Presence of cardiac pacemaker: Secondary | ICD-10-CM

## 2021-09-02 NOTE — Telephone Encounter (Signed)
  Patient Consent for Virtual Visit        Nathan Miller has provided verbal consent on 09/02/2021 for a virtual visit (video or telephone).   CONSENT FOR VIRTUAL VISIT FOR:  Nathan Miller  By participating in this virtual visit I agree to the following:  I hereby voluntarily request, consent and authorize [place] and its employed or contracted physicians, physician assistants, nurse practitioners or other licensed health care professionals (the Practitioner), to provide me with telemedicine health care services (the "Services") as deemed necessary by the treating Practitioner. I acknowledge and consent to receive the Services by the Practitioner via telemedicine. I understand that the telemedicine visit will involve communicating with the Practitioner through live audiovisual communication technology and the disclosure of certain medical information by electronic transmission. I acknowledge that I have been given the opportunity to request an in-person assessment or other available alternative prior to the telemedicine visit and am voluntarily participating in the telemedicine visit.  I understand that I have the right to withhold or withdraw my consent to the use of telemedicine in the course of my care at any time, without affecting my right to future care or treatment, and that the Practitioner or I may terminate the telemedicine visit at any time. I understand that I have the right to inspect all information obtained and/or recorded in the course of the telemedicine visit and may receive copies of available information for a reasonable fee.  I understand that some of the potential risks of receiving the Services via telemedicine include:  Delay or interruption in medical evaluation due to technological equipment failure or disruption; Information transmitted may not be sufficient (e.g. poor resolution of images) to allow for appropriate medical decision making by the Practitioner; and/or   In rare instances, security protocols could fail, causing a breach of personal health information.  Furthermore, I acknowledge that it is my responsibility to provide information about my medical history, conditions and care that is complete and accurate to the best of my ability. I acknowledge that Practitioner's advice, recommendations, and/or decision may be based on factors not within their control, such as incomplete or inaccurate data provided by me or distortions of diagnostic images or specimens that may result from electronic transmissions. I understand that the practice of medicine is not an exact science and that Practitioner makes no warranties or guarantees regarding treatment outcomes. I acknowledge that a copy of this consent can be made available to me via my patient portal ([place]), or I can request a printed copy by calling the office of [facility].    I understand that my insurance will be billed for this visit.   I have read or had this consent read to me. I understand the contents of this consent, which adequately explains the benefits and risks of the Services being provided via telemedicine.  I have been provided ample opportunity to ask questions regarding this consent and the Services and have had my questions answered to my satisfaction. I give my informed consent for the services to be provided through the use of telemedicine in my medical care

## 2021-09-04 NOTE — Progress Notes (Signed)
Virtual Visit via Telephone Note   This visit type was conducted due to national recommendations for restrictions regarding the COVID-19 Pandemic (e.g. social distancing) in an effort to limit this patient's exposure and mitigate transmission in our community.  Due to his co-morbid illnesses, this patient is at least at moderate risk for complications without adequate follow up.  This format is felt to be most appropriate for this patient at this time.  The patient did not have access to video technology/had technical difficulties with video requiring transitioning to audio format only (telephone).  All issues noted in this document were discussed and addressed.  No physical exam could be performed with this format.  Please refer to the patient's chart for his  consent to telehealth for [facility].    Date:  09/05/2021   ID:  VERONICA GILHOOLEY, DOB 04-15-1951, MRN [id number] The patient was identified using 2 identifiers.  Patient Location: Home Provider Location: Office/Clinic   PCP:  [facility]   [facility] Providers Cardiologist:  Rollene Rotunda, MD Electrophysiologist:  Hillis Range, MD     Evaluation Performed:  Follow-Up Visit  Chief Complaint: Hospital follow-up  History of Present Illness:    Nathan Miller is a 70 y.o. 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   Recent hospital admission 08/01/2021 for purulent drainage from right foot with concern for acute infection.  On presentation to ED he received antibiotics with IV vancomycin, Zosyn and clindamycin.  He was seen by orthopedic surgery.  He is status  post right transtibial amputation on 08/03/2021.  Hospital course complicated by acute blood loss anemia with significant IDA and positive fecal occult blood test.  He was transfused 2 units packed red blood cells for hemoglobin of 7.2 on 08/06/2021 as well as an infusion of IV Feraheme.  Repeat hemoglobin 8.7 on 08/07/2021.  He has been seen by GI post EGD and colonoscopy on 08/07/2021.  EGD due revealed gastric ulcers and duodenal ulcers without bleeding.  Colonoscopy revealed diverticulitis.  No perforation, abscess, polyps or hemorrhoids.  GI recommended Protonix 40 mg p.o. twice daily x60 days and to avoid NSAIDs.  Follow-up with GI outpatient.  CTA of the abdomen obtained by vascular surgery was significant for right superficial femoral stenosis of high degree measuring 60% in addition of multifocal high-grade stenosis of left superficial femoral artery.  He was continuing aspirin 81 mg daily and Lipitor 80 mg daily.  Blood pressure was stable and he was resuming home regimen of antihypertensive.  He was continuing 2 L nasal cannula for COPD along with Spiriva and albuterol.  To continue maintaining O2 saturations greater than 90%.  Echocardiogram grade 3 diastolic dysfunction with normal EF.  He is continuing on torsemide 20 mg daily.  To continue strict I's and O's and daily weights.   I spoke to the patient by phone today.  States he is doing fine since his right foot amputation.  He denies any complications as a result of the surgery.  Denies any anginal or exertional symptoms.  Denies any issues with his pacemaker.  No PND, orthopnea, DOE/SOB, no palpitations or arrhythmias.  No orthostatic symptoms.  No CVA or TIA-like symptoms.  Blood pressure  is well controlled today at 124/60.  Heart rate is 50.  Denies any bleeding issues, claudication symptoms and left extremity.  Denies any DVT or PE-like symptoms, or lower extremity edema.  His recent device check by Dr. Johney Frame on 03/30/2021 showed normal device  function.   The patient does not have symptoms concerning for COVID-19 infection (fever, chills, cough, or new shortness of breath).    Past Medical History:  Diagnosis Date   CAD (coronary artery disease)    NSTEMI in 2001. Cardiac cath showed: LAD: 40% proximal, LCX: 70% mid, RCA thrombotic occlusion in mid segment. s/p PCI and 2 overlapped BMSs to RCA.    CHF (congestive heart failure) (HCC)    Diabetes mellitus    Hypertension    Osteomyelitis (HCC)    Presence of permanent cardiac pacemaker    Past Surgical History:  Procedure Laterality Date   AMPUTATION Right 08/03/2021   Procedure: AMPUTATION BELOW KNEE;  Surgeon: Nadara Mustard, MD;  Location: [facility] OR;  Service: Orthopedics;  Laterality: Right;   BIOPSY  08/07/2021   Procedure: BIOPSY;  Surgeon: Kerin Salen, MD;  Location: [facility] ENDOSCOPY;  Service: Gastroenterology;;   BUBBLE STUDY  04/07/2021   Procedure: BUBBLE STUDY;  Surgeon: Parke Poisson, MD;  Location: [facility] ENDOSCOPY;  Service: Cardiology;;   CARDIAC CATHETERIZATION     COLONOSCOPY N/A 08/07/2021   Procedure: COLONOSCOPY;  Surgeon: Kerin Salen, MD;  Location: [facility] ENDOSCOPY;  Service: Gastroenterology;  Laterality: N/A;   CORONARY ANGIOPLASTY     ESOPHAGOGASTRODUODENOSCOPY (EGD) WITH PROPOFOL N/A 08/07/2021   Procedure: ESOPHAGOGASTRODUODENOSCOPY (EGD) WITH PROPOFOL;  Surgeon: Kerin Salen, MD;  Location: [facility] ENDOSCOPY;  Service: Gastroenterology;  Laterality: N/A;   PERMANENT PACEMAKER INSERTION N/A 01/22/2015   MDT Advisa pacemaker implanted for complete heart block by Dr Johney Frame   POLYPECTOMY  08/07/2021   Procedure: POLYPECTOMY;  Surgeon: Kerin Salen, MD;  Location: [facility] ENDOSCOPY;  Service: Gastroenterology;;   TEE WITHOUT CARDIOVERSION N/A 04/07/2021   Procedure: TRANSESOPHAGEAL ECHOCARDIOGRAM (TEE);  Surgeon: Parke Poisson, MD;  Location[facility] ENDOSCOPY;  Service: Cardiology;  Laterality: N/A;   TEMPORARY PACEMAKER INSERTION N/A 01/21/2015   Procedure: TEMPORARY PACEMAKER  INSERTION;  Surgeon: Marykay Lex, MD;  Location: [facility] CATH LAB;  Service: Cardiovascular;  Laterality: N/A;     Current Meds  Medication Sig   acetaminophen (TYLENOL) 500 MG tablet Take 500 mg by mouth every 6 (six) hours as needed for mild pain or fever.   albuterol (VENTOLIN HFA) 108 (90 Base) MCG/ACT inhaler Inhale 1-2 puffs into the lungs every 6 (six) hours as needed for wheezing or shortness of breath.   aspirin EC 81 MG tablet Take 1 tablet (81 mg total) by mouth daily.   atorvastatin (LIPITOR) 80 MG tablet Take 1 tablet (80 mg total) by mouth daily.   carvedilol (COREG) 25 MG tablet Take 1 tablet (25 mg total) by mouth 2 (two) times daily.   ferrous sulfate 325 (65 FE) MG tablet Take 325 mg by mouth every other day.   metFORMIN (GLUCOPHAGE) 500 MG tablet Take 1 tablet (500 mg total) by mouth 2 (two) times daily with a meal.   Multiple Vitamins-Minerals (CENTRUM ADULTS) TABS Take 1 tablet by mouth daily.   nitroGLYCERIN (NITROSTAT) 0.4 MG SL tablet Place 1 tablet (0.4 mg total) under the tongue every 5 (five) minutes as needed for chest pain.   pantoprazole (PROTONIX) 40 MG tablet Take 1 tablet (40 mg total) by mouth 2 (two) times daily.  polyethylene glycol (MIRALAX) 17 g packet Take 17 g by mouth daily as needed.   SPIRIVA RESPIMAT 2.5 MCG/ACT AERS Inhale 2 puffs into the lungs daily.   torsemide (DEMADEX) 20 MG tablet Take 20 mg by mouth daily.   [DISCONTINUED] ferrous sulfate 325 (65 FE) MG tablet Take 1 tablet (325 mg total) by mouth daily with breakfast. (Patient taking differently: Take 325 mg by mouth every other day.)   [DISCONTINUED] sodium hypochlorite (DAKIN'S 1/2 STRENGTH) external solution Apply 1 application topically daily.     Allergies:   Patient has no known allergies.   Social History   Tobacco Use   Smoking status: Former    Packs/day: 0.30    Years: 40.00    Pack years: 12.00    Types: Cigarettes    Start date: 10/16/1968    Quit date: 10/17/2007     Years since quitting: 13.8   Smokeless tobacco: Current    Types: Snuff  Vaping Use   Vaping Use: Never used  Substance Use Topics   Alcohol use: Yes    Alcohol/week: 42.0 standard drinks    Types: 42 Cans of beer per week    Comment: beer   Drug use: No     Family Hx: The patient's family history includes COPD in his father; Heart disease in his father and paternal grandfather.  ROS:   Please see the history of present illness.    All other systems reviewed and are negative.   Prior CV studies:   The following studies were reviewed today:  CT angio of abdominal aorta with iliofemoral runoff bifemoral 08/02/2021  Vascular  1. Atherosclerotic changes of the right superficial femoral artery with high degree of stenosis measuring 60%. 2. Multifocal high-grade stenosis of the left superficial femoral artery. 3. Three-vessel runoff to the right ankle. Two vessel runoff to the left ankle.  Non Vascular   1. Advanced degenerative changes of the right foot with active ulcer and abscess again seen. 2. Extensive distal colonic diverticulosis without evidence of acute diverticulitis.    ABI 08/01/2021 Right: Resting right ankle-brachial index indicates moderate right lower  extremity arterial disease. The right toe-brachial index is abnormal.   Left: Resting left ankle-brachial index indicates mild left lower  extremity arterial disease. The left toe-brachial index is abnormal.     TEE 04/13/2021  1. Left ventricular ejection fraction, by estimation, is 55 to 60%. The  left ventricle has normal function.   2. Right ventricular systolic function is normal. The right ventricular  size is normal.   3. Left atrial size was mildly dilated. No left atrial/left atrial  appendage thrombus was detected.   4. The mitral valve is normal in structure. Trivial mitral valve  regurgitation.   5. Tricuspid valve regurgitation is mild to moderate.   6. The aortic valve is normal in  structure. Aortic valve regurgitation is  trivial. No aortic stenosis is present.   7. There is Moderate (Grade III) atheroma plaque involving the transverse  and descending aorta.   8. Agitated saline contrast bubble study was positive with shunting  observed within 3-6 cardiac cycles suggestive of interatrial shunt. There  is a small patent foramen ovale with predominantly right to left shunting  across the atrial septum.   Conclusion(s)/Recommendation(s): No evidence of vegetation/infective  endocarditis on this transesophageal echocardiogram.    Echocardiogram 03/31/2021 1. Left ventricular ejection fraction, by estimation, is 55 to 60%. The  left ventricle has normal function. Left ventricular endocardial border  not optimally defined to evaluate regional wall motion. There is severe  concentric left ventricular  hypertrophy. Left ventricular diastolic parameters are consistent with  Grade II diastolic dysfunction (pseudonormalization). Elevated left  ventricular end-diastolic pressure.   2. Right ventricular systolic function is normal. The right ventricular  size is normal. Tricuspid regurgitation signal is inadequate for assessing  PA pressure.   3. Left atrial size was mildly dilated.   4. Right atrial size was mildly dilated.   5. The mitral valve is normal in structure. No evidence of mitral valve  regurgitation. No evidence of mitral stenosis.   6. The aortic valve was not well visualized. Aortic valve regurgitation  is not visualized.   7. The inferior vena cava is normal in size with greater than 50%  respiratory variability, suggesting right atrial pressure of 3 mmHg.    Labs/Other Tests and Data Reviewed:    EKG:    Recent Labs: 03/30/2021: B Natriuretic Peptide 503.2 04/08/2021: Magnesium 2.2 08/02/2021: ALT 14 08/05/2021: BUN 7; Potassium 3.6; Sodium 135 08/07/2021: Hemoglobin 8.7; Platelets 287 08/09/2021: Creatinine, Ser 0.94   Recent Lipid Panel No results  found for: CHOL, TRIG, HDL, CHOLHDL, LDLCALC, LDLDIRECT  Wt Readings from Last 3 Encounters:  09/05/21 232 lb (105.2 kg)  08/08/21 246 lb 11.1 oz (111.9 kg)  06/03/21 253 lb (114.8 kg)     Risk Assessment/Calculations:          Objective:    Vital Signs:  BP 124/60   Pulse (!) 50   Ht 5' 8.5" (1.74 m)   Wt 232 lb (105.2 kg)   BMI 34.76 kg/m      Patient had normal speech pattern today and responding appropriately.  No evidence of wheezing, cough, or dyspnea.   ASSESSMENT & PLAN:     1. Chronic diastolic heart failure (HCC) Denies any weight gain, shortness of breath, DOE, SOB, PND, orthopnea, lower extremity edema.  Recent echocardiogram on 03/31/2021 EF 55 to 60%.  Severe concentric LVH, G2 DD, LA mildly dilated, RA mildly dilated.  Continue torsemide 20 mg daily.  Continue carvedilol 25 mg p.o. twice daily.  2. PAD (peripheral artery disease) (HCC) He is status post right transtibial amputation on 08/03/2021 Dr. Lajoyce Corners.  Hospital course was complicated by acute blood loss anemia with significant IDA and positive fecal occult blood test.  He was transfused 2 units packed red blood cells for hemoglobin of 7.2 on 08/06/2021 as well as an infusion of IV Feraheme.  Repeat hemoglobin 8.7 on 08/07/2021.  He states that area is healing well and he will be fitted for prosthesis in the near future.  3.  Cardiac pacemaker in situ Recent pacemaker check on 03/30/2021 by Dr. Johney Frame was functioning normally.    4.  Hyperlipidemia. Continue atorvastatin 80 mg p.o. daily.  Continue aspirin 81 mg daily.      COVID-19 Education: The signs and symptoms of COVID-19 were discussed with the patient and how to seek care for testing (follow up with PCP or arrange E-visit).  The importance of social distancing was discussed today.  Time:   Today, I have spent 6  minutes with the patient with telehealth technology discussing the above problems.     Medication Adjustments/Labs and Tests  Ordered: Current medicines are reviewed at length with the patient today.  Concerns regarding medicines are outlined above.   Tests Ordered: No orders of the defined types were placed in this encounter.   Medication Changes: No orders of the defined  types were placed in this encounter.   Follow Up:  In Person in 6 month(s)  Signed, Netta Neat, NP  09/05/2021 8:26 AM    [facility]

## 2021-09-05 ENCOUNTER — Encounter (HOSPITAL_BASED_OUTPATIENT_CLINIC_OR_DEPARTMENT_OTHER): Payer: PPO | Admitting: Internal Medicine

## 2021-09-05 ENCOUNTER — Encounter: Payer: Self-pay | Admitting: Family Medicine

## 2021-09-05 ENCOUNTER — Ambulatory Visit (INDEPENDENT_AMBULATORY_CARE_PROVIDER_SITE_OTHER): Payer: PPO | Admitting: Family Medicine

## 2021-09-05 VITALS — BP 124/60 | HR 50 | Ht 68.5 in | Wt 232.0 lb

## 2021-09-05 DIAGNOSIS — I5032 Chronic diastolic (congestive) heart failure: Secondary | ICD-10-CM

## 2021-09-05 DIAGNOSIS — I10 Essential (primary) hypertension: Secondary | ICD-10-CM

## 2021-09-05 DIAGNOSIS — Z950 Presence of cardiac pacemaker: Secondary | ICD-10-CM

## 2021-09-05 DIAGNOSIS — I739 Peripheral vascular disease, unspecified: Secondary | ICD-10-CM

## 2021-09-05 DIAGNOSIS — E782 Mixed hyperlipidemia: Secondary | ICD-10-CM | POA: Diagnosis not present

## 2021-09-05 DIAGNOSIS — Z720 Tobacco use: Secondary | ICD-10-CM

## 2021-09-05 DIAGNOSIS — I442 Atrioventricular block, complete: Secondary | ICD-10-CM

## 2021-09-05 NOTE — Patient Instructions (Signed)
Medication Instructions:   Your physician recommends that you continue on your current medications as directed. Please refer to the Current Medication list given to you today.  *If you need a refill on your cardiac medications before your next appointment, please call your pharmacy*   Lab Work: [place]   If you have labs (blood work) drawn today and your tests are completely normal, you will receive your results only by: [place] (if you have MyChart) OR A paper copy in the mail If you have any lab test that is abnormal or we need to change your treatment, we will call you to review the results.   Testing/Procedures: NONE ORDERED  TODAY     Follow-Up: At [facility], you and your health needs are our priority.  As part of our continuing mission to provide you with exceptional heart care, we have created designated Provider Care Teams.  These Care Teams include your primary Cardiologist (physician) and Advanced Practice Providers (APPs -  Physician Assistants and Nurse Practitioners) who all work together to provide you with the care you need, when you need it.  We recommend signing up for the patient portal called "MyChart".  Sign up information is provided on this After Visit Summary.  MyChart is used to connect with patients for Virtual Visits (Telemedicine).  Patients are able to view lab/test results, encounter notes, upcoming appointments, etc.  Non-urgent messages can be sent to your provider as well.   To learn more about what you can do with MyChart, go to [email or web address].    Your next appointment:   6 month(s)  The format for your next appointment:   In Person  Provider:   You may see Minus Breeding, MD or the following Advanced Practice Provider on your designated Care Team:   Katina Dung, [facility]    Other Instructions

## 2021-09-17 DIAGNOSIS — I509 Heart failure, unspecified: Secondary | ICD-10-CM | POA: Diagnosis not present

## 2021-09-17 DIAGNOSIS — R0902 Hypoxemia: Secondary | ICD-10-CM | POA: Diagnosis not present

## 2021-09-17 DIAGNOSIS — E871 Hypo-osmolality and hyponatremia: Secondary | ICD-10-CM | POA: Diagnosis not present

## 2021-09-26 ENCOUNTER — Ambulatory Visit (INDEPENDENT_AMBULATORY_CARE_PROVIDER_SITE_OTHER): Payer: PPO

## 2021-09-26 DIAGNOSIS — I442 Atrioventricular block, complete: Secondary | ICD-10-CM | POA: Diagnosis not present

## 2021-09-27 LAB — CUP PACEART REMOTE DEVICE CHECK
Battery Remaining Longevity: 51 mo
Battery Voltage: 2.98 V
Brady Statistic RV Percent Paced: 99.49 %
Date Time Interrogation Session: 20221129174152
Implantable Lead Implant Date: 20160325
Implantable Lead Location: 753860
Implantable Lead Model: 5076
Implantable Pulse Generator Implant Date: 20160325
Lead Channel Impedance Value: 456 Ohm
Lead Channel Impedance Value: 494 Ohm
Lead Channel Pacing Threshold Amplitude: 0.625 V
Lead Channel Pacing Threshold Pulse Width: 0.4 ms
Lead Channel Sensing Intrinsic Amplitude: 15.875 mV
Lead Channel Sensing Intrinsic Amplitude: 15.875 mV
Lead Channel Setting Pacing Amplitude: 2.5 V
Lead Channel Setting Pacing Pulse Width: 0.4 ms
Lead Channel Setting Sensing Sensitivity: 0.9 mV

## 2021-10-04 NOTE — Progress Notes (Signed)
Remote pacemaker transmission.   

## 2021-10-18 DIAGNOSIS — Z89511 Acquired absence of right leg below knee: Secondary | ICD-10-CM | POA: Diagnosis not present

## 2021-11-16 ENCOUNTER — Ambulatory Visit (HOSPITAL_COMMUNITY): Payer: PPO | Attending: Physician Assistant | Admitting: Physical Therapy

## 2021-12-26 ENCOUNTER — Other Ambulatory Visit (HOSPITAL_BASED_OUTPATIENT_CLINIC_OR_DEPARTMENT_OTHER): Payer: Self-pay

## 2021-12-26 ENCOUNTER — Ambulatory Visit (INDEPENDENT_AMBULATORY_CARE_PROVIDER_SITE_OTHER): Payer: PPO

## 2021-12-26 DIAGNOSIS — I442 Atrioventricular block, complete: Secondary | ICD-10-CM

## 2021-12-28 LAB — CUP PACEART REMOTE DEVICE CHECK
Battery Remaining Longevity: 50 mo
Battery Voltage: 2.97 V
Brady Statistic RV Percent Paced: 98.74 %
Date Time Interrogation Session: 20230228110122
Implantable Lead Implant Date: 20160325
Implantable Lead Location: 753860
Implantable Lead Model: 5076
Implantable Pulse Generator Implant Date: 20160325
Lead Channel Impedance Value: 475 Ohm
Lead Channel Impedance Value: 494 Ohm
Lead Channel Pacing Threshold Amplitude: 0.625 V
Lead Channel Pacing Threshold Pulse Width: 0.4 ms
Lead Channel Sensing Intrinsic Amplitude: 19.375 mV
Lead Channel Sensing Intrinsic Amplitude: 19.375 mV
Lead Channel Setting Pacing Amplitude: 2.5 V
Lead Channel Setting Pacing Pulse Width: 0.4 ms
Lead Channel Setting Sensing Sensitivity: 0.9 mV

## 2021-12-30 NOTE — Progress Notes (Signed)
Remote pacemaker transmission.   

## 2022-03-28 ENCOUNTER — Ambulatory Visit (INDEPENDENT_AMBULATORY_CARE_PROVIDER_SITE_OTHER): Payer: PPO

## 2022-03-28 DIAGNOSIS — I442 Atrioventricular block, complete: Secondary | ICD-10-CM

## 2022-03-29 LAB — CUP PACEART REMOTE DEVICE CHECK
Battery Remaining Longevity: 50 mo
Battery Voltage: 2.97 V
Brady Statistic RV Percent Paced: 98.47 %
Date Time Interrogation Session: 20230531100735
Implantable Lead Implant Date: 20160325
Implantable Lead Location: 753860
Implantable Lead Model: 5076
Implantable Pulse Generator Implant Date: 20160325
Lead Channel Impedance Value: 475 Ohm
Lead Channel Impedance Value: 494 Ohm
Lead Channel Pacing Threshold Amplitude: 0.625 V
Lead Channel Pacing Threshold Pulse Width: 0.4 ms
Lead Channel Sensing Intrinsic Amplitude: 12.375 mV
Lead Channel Sensing Intrinsic Amplitude: 12.375 mV
Lead Channel Setting Pacing Amplitude: 2.5 V
Lead Channel Setting Pacing Pulse Width: 0.4 ms
Lead Channel Setting Sensing Sensitivity: 0.9 mV

## 2022-04-13 NOTE — Progress Notes (Signed)
Remote pacemaker transmission.   

## 2022-05-05 ENCOUNTER — Encounter: Payer: Self-pay | Admitting: Internal Medicine

## 2022-05-05 ENCOUNTER — Ambulatory Visit (INDEPENDENT_AMBULATORY_CARE_PROVIDER_SITE_OTHER): Payer: PPO | Admitting: Internal Medicine

## 2022-05-05 VITALS — BP 136/70 | HR 74 | Ht 70.0 in | Wt 245.2 lb

## 2022-05-05 DIAGNOSIS — I442 Atrioventricular block, complete: Secondary | ICD-10-CM

## 2022-05-05 DIAGNOSIS — I10 Essential (primary) hypertension: Secondary | ICD-10-CM

## 2022-05-05 NOTE — Progress Notes (Signed)
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  Since last being seen in our clinic, the patient reports doing very well.  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  Today, he denies symptoms of palpitations, chest pain, shortness of breath,  lower extremity edema, dizziness, presyncope, or syncope.  The patient is otherwise without complaint today.   Past Medical History:  Diagnosis Date   CAD (coronary artery disease)    NSTEMI in 2001. Cardiac cath showed: LAD: 40% proximal, LCX: 70% mid, RCA thrombotic occlusion in mid segment. s/p PCI and 2 overlapped BMSs to RCA.    CHF (congestive heart failure) (HCC)    Diabetes mellitus    Hypertension    Osteomyelitis ([place])    Presence of permanent cardiac pacemaker    Past Surgical History:  Procedure Laterality Date   AMPUTATION Right 08/03/2021   Procedure: AMPUTATION BELOW KNEE;  Surgeon: Newt Minion, MD;  Location: [place];  Service: Orthopedics;  Laterality: Right;   BIOPSY  08/07/2021   Procedure: BIOPSY;  Surgeon: Ronnette Juniper, MD;  Location[facility] ENDOSCOPY;  Service: Gastroenterology;;   BUBBLE STUDY  04/07/2021   Procedure: BUBBLE STUDY;  Surgeon: Elouise Munroe, MD;  Location[facility] ENDOSCOPY;  Service: Cardiology;;   CARDIAC CATHETERIZATION     COLONOSCOPY N/A 08/07/2021   Procedure: COLONOSCOPY;  Surgeon: Ronnette Juniper, MD;  Location: [place];  Service: Gastroenterology;  Laterality: N/A;   CORONARY ANGIOPLASTY     ESOPHAGOGASTRODUODENOSCOPY (EGD) WITH PROPOFOL N/A 08/07/2021   Procedure: ESOPHAGOGASTRODUODENOSCOPY (EGD) WITH PROPOFOL;  Surgeon: Ronnette Juniper, MD;  Location: [place];  Service: Gastroenterology;  Laterality: N/A;   PERMANENT PACEMAKER INSERTION N/A 01/22/2015   MDT Advisa pacemaker implanted for complete heart block by Dr Rayann Heman   POLYPECTOMY  08/07/2021   Procedure: POLYPECTOMY;   Surgeon: Ronnette Juniper, MD;  Location: [place];  Service: Gastroenterology;;   TEE WITHOUT CARDIOVERSION N/A 04/07/2021   Procedure: TRANSESOPHAGEAL ECHOCARDIOGRAM (TEE);  Surgeon: Elouise Munroe, MD;  Location: [place];  Service: Cardiology;  Laterality: N/A;   TEMPORARY PACEMAKER INSERTION N/A 01/21/2015   Procedure: TEMPORARY PACEMAKER INSERTION;  Surgeon: Leonie Man, MD;  Location: [facility] CATH LAB;  Service: Cardiovascular;  Laterality: N/A;    ROS- all systems are reviewed and negative except as per HPI above  Current Outpatient Medications  Medication Sig Dispense Refill   albuterol (VENTOLIN HFA) 108 (90 Base) MCG/ACT inhaler Inhale 1-2 puffs into the lungs every 6 (six) hours as needed for wheezing or shortness of breath.     aspirin EC 81 MG tablet Take 1 tablet (81 mg total) by mouth daily. 30 tablet 0   atorvastatin (LIPITOR) 80 MG tablet Take 1 tablet (80 mg total) by mouth daily. 90 tablet 3   carvedilol (COREG) 25 MG tablet Take 1 tablet (25 mg total) by mouth 2 (two) times daily. 180 tablet 3   ferrous sulfate 325 (65 FE) MG tablet Take 325 mg by mouth every other day.     metFORMIN (GLUCOPHAGE) 500 MG tablet Take 1 tablet (500 mg total) by mouth 2 (two) times daily with a meal. 60 tablet 0   Multiple Vitamins-Minerals (CENTRUM ADULTS) TABS Take 1 tablet by mouth daily.     nitroGLYCERIN (NITROSTAT) 0.4 MG SL tablet Place 1 tablet (0.4 mg total) under the tongue every 5 (five)  minutes as needed for chest pain. 25 tablet 3   SPIRIVA RESPIMAT 2.5 MCG/ACT AERS Inhale 2 puffs into the lungs daily.     torsemide (DEMADEX) 20 MG tablet Take 20 mg by mouth daily.     acetaminophen (TYLENOL) 500 MG tablet Take 500 mg by mouth every 6 (six) hours as needed for mild pain or fever. (Patient not taking: Reported on 05/05/2022)     pantoprazole (PROTONIX) 40 MG tablet Take 1 tablet (40 mg total) by mouth 2 (two) times daily. (Patient not taking: Reported on 05/05/2022) 60 tablet 1    polyethylene glycol (MIRALAX) 17 g packet Take 17 g by mouth daily as needed. (Patient not taking: Reported on 05/05/2022) 14 each 0   No current facility-administered medications for this visit.    Physical Exam: Vitals:   05/05/22 1446  BP: 136/70  Pulse: 74  SpO2: 93%  Weight: 245 lb 3.2 oz (111.2 kg)  Height: '5\' 10"'$  (1.778 m)    GEN- The patient is well appearing, alert and oriented x 3 today.   Head- normocephalic, atraumatic Eyes-  Sclera clear, conjunctiva pink Ears- hearing intact Oropharynx- clear Lungs- Clear to ausculation bilaterally, normal work of breathing Chest- pacemaker pocket is well healed Heart- RRR (paced) GI- soft, NT, ND, + BS Extremities- no clubbing, cyanosis, or edema  Pacemaker interrogation- reviewed in detail today,  See PACEART report  ekg tracing ordered today is personally reviewed and shows sinus with AV dissociation, V paced  Assessment and Plan:  1. Symptomatic complete heart block Normal pacemaker function See Pace Art report No changes today he is device dependant today At time of implant, only a ventricular lead could be placed due to venous anatomy issues. He has done great with VVI pacing and currently we do not plan to upgrade his device.  2. HTN Stable No change required today  3. CAD No ischemic symptoms  4. Obesity Body mass index is 35.18 kg/m. Lifestyle modification advised He is making progress  Return in a year  Thompson Grayer MD, [facility] 05/05/2022 2:49 PM

## 2022-05-05 NOTE — Patient Instructions (Signed)
Medication Instructions:  Continue all current medications.  Labwork: none  Testing/Procedures: none  Follow-Up: 1 year - Dr.  Allred   Any Other Special Instructions Will Be Listed Below (If Applicable).   If you need a refill on your cardiac medications before your next appointment, please call your pharmacy.  

## 2022-06-02 ENCOUNTER — Encounter: Payer: PPO | Admitting: Internal Medicine

## 2022-06-05 DIAGNOSIS — I5032 Chronic diastolic (congestive) heart failure: Secondary | ICD-10-CM | POA: Insufficient documentation

## 2022-06-05 DIAGNOSIS — I739 Peripheral vascular disease, unspecified: Secondary | ICD-10-CM | POA: Insufficient documentation

## 2022-06-05 NOTE — Progress Notes (Unsigned)
Cardiology Office Note   Date:  06/07/2022   ID:  Osei, Gorsky 05-10-1951, MRN [id number]  PCP:  [facility]  Cardiologist:   Rollene Rotunda, MD   Chief Complaint  Patient presents with   Coronary Artery Disease      History of Present Illness: Nathan Miller is a 71 y.o. male who presents for follow up of CAD/NSETMI, DM, chronic diastolic HF, and CHB with pacemaker.  He also has had peripheral vascular disease with right transtibial amputation in October of last year.  He now has a prosthesis.  He is doing a little walking on this.  He denies any chest pressure, neck or arm discomfort.  He thinks his breathing is okay.  He is not having any PND or orthopnea.  He does wear 2 L of oxygen at night he is on this chronically.  He was seen recently by Dr. Johney Frame in pacemaker clinic and has normal function  Of note this is his first visit with me in many years.  He was followed in [place].     Past Medical History:  Diagnosis Date   CAD (coronary artery disease)    NSTEMI in 2001. Cardiac cath showed: LAD: 40% proximal, LCX: 70% mid, RCA thrombotic occlusion in mid segment. s/p PCI and 2 overlapped BMSs to RCA.    CHF (congestive heart failure) (HCC)    Diabetes mellitus    Hypertension    Osteomyelitis (HCC)    Presence of permanent cardiac pacemaker     Past Surgical History:  Procedure Laterality Date   AMPUTATION Right 08/03/2021   Procedure: AMPUTATION BELOW KNEE;  Surgeon: Nadara Mustard, MD;  Location: [facility] OR;  Service: Orthopedics;  Laterality: Right;   BIOPSY  08/07/2021   Procedure: BIOPSY;  Surgeon: Kerin Salen, MD;  Location: [facility] ENDOSCOPY;  Service: Gastroenterology;;   BUBBLE STUDY  04/07/2021   Procedure: BUBBLE STUDY;  Surgeon: Parke Poisson, MD;  Location: [facility] ENDOSCOPY;  Service: Cardiology;;   CARDIAC CATHETERIZATION     COLONOSCOPY N/A 08/07/2021   Procedure: COLONOSCOPY;  Surgeon: Kerin Salen, MD;  Location: [facility] ENDOSCOPY;   Service: Gastroenterology;  Laterality: N/A;   CORONARY ANGIOPLASTY     ESOPHAGOGASTRODUODENOSCOPY (EGD) WITH PROPOFOL N/A 08/07/2021   Procedure: ESOPHAGOGASTRODUODENOSCOPY (EGD) WITH PROPOFOL;  Surgeon: Kerin Salen, MD;  Location: [facility] ENDOSCOPY;  Service: Gastroenterology;  Laterality: N/A;   PERMANENT PACEMAKER INSERTION N/A 01/22/2015   MDT Advisa pacemaker implanted for complete heart block by Dr Johney Frame   POLYPECTOMY  08/07/2021   Procedure: POLYPECTOMY;  Surgeon: Kerin Salen, MD;  Location: [facility] ENDOSCOPY;  Service: Gastroenterology;;   TEE WITHOUT CARDIOVERSION N/A 04/07/2021   Procedure: TRANSESOPHAGEAL ECHOCARDIOGRAM (TEE);  Surgeon: Parke Poisson, MD;  Location: [facility] ENDOSCOPY;  Service: Cardiology;  Laterality: N/A;   TEMPORARY PACEMAKER INSERTION N/A 01/21/2015   Procedure: TEMPORARY PACEMAKER INSERTION;  Surgeon: Marykay Lex, MD;  Location: [facility] CATH LAB;  Service: Cardiovascular;  Laterality: N/A;     Current Outpatient Medications  Medication Sig Dispense Refill   acetaminophen (TYLENOL) 500 MG tablet Take 500 mg by mouth every 6 (six) hours as needed for mild pain or fever.     albuterol (VENTOLIN HFA) 108 (90 Base) MCG/ACT inhaler Inhale 1-2 puffs into the lungs every 6 (six) hours as needed for wheezing or shortness of breath.     aspirin EC 81 MG tablet Take 1 tablet (81 mg total) by mouth daily. 30 tablet 0  atorvastatin (LIPITOR) 80 MG tablet Take 1 tablet (80 mg total) by mouth daily. 90 tablet 3   carvedilol (COREG) 25 MG tablet Take 1 tablet (25 mg total) by mouth 2 (two) times daily. 180 tablet 3   ferrous sulfate 325 (65 FE) MG tablet Take 325 mg by mouth every other day.     hydrOXYzine (ATARAX) 25 MG tablet Take 25 mg by mouth 3 (three) times daily as needed.     metFORMIN (GLUCOPHAGE) 500 MG tablet Take 1 tablet (500 mg total) by mouth 2 (two) times daily with a meal. 60 tablet 0   Multiple Vitamins-Minerals (CENTRUM ADULTS) TABS Take 1 tablet by mouth daily.      nitroGLYCERIN (NITROSTAT) 0.4 MG SL tablet Place 1 tablet (0.4 mg total) under the tongue every 5 (five) minutes as needed for chest pain. 25 tablet 3   polyethylene glycol (MIRALAX) 17 g packet Take 17 g by mouth daily as needed. 14 each 0   SPIRIVA RESPIMAT 2.5 MCG/ACT AERS Inhale 2 puffs into the lungs daily.     torsemide (DEMADEX) 20 MG tablet Take 20 mg by mouth daily.     pantoprazole (PROTONIX) 40 MG tablet Take 1 tablet (40 mg total) by mouth 2 (two) times daily. (Patient not taking: Reported on 05/05/2022) 60 tablet 1   No current facility-administered medications for this visit.    Allergies:   Patient has no known allergies.     ROS:  Please see the history of present illness.   Otherwise, review of systems are positive for none.   All other systems are reviewed and negative.    PHYSICAL EXAM: VS:  BP 139/74   Pulse 64   Ht 5\' 8"  (1.727 m)   Wt 240 lb (108.9 kg)   BMI 36.49 kg/m  , BMI Body mass index is 36.49 kg/m. GEN:  No distress NECK:  No jugular venous distention at 90 degrees, waveform within normal limits, carotid upstroke brisk and symmetric, no bruits, no thyromegaly LYMPHATICS:  No cervical adenopathy LUNGS:  Clear to auscultation bilaterally BACK:  No CVA tenderness CHEST:  Unremarkable HEART:  S1 and S2 within normal limits, no S3, no S4, no clicks, no rubs, no murmurs ABD:  Positive bowel sounds normal in frequency in pitch, no bruits, no rebound, no guarding, unable to assess midline mass or bruit with the patient seated. EXT:  2 plus pulses upper extremities, mild left leg edema, no cyanosis no clubbing, right leg lower extremity amputation SKIN:  No rashes no nodules NEURO:  Cranial nerves II through XII grossly intact, motor grossly intact throughout PSYCH:  Cognitively intact, oriented to person place and time    Echocardiogram 03/31/2021  1. Left ventricular ejection fraction, by estimation, is 55 to 60%. The  left ventricle has normal function.  Left ventricular endocardial border  not optimally defined to evaluate regional wall motion. There is severe  concentric left ventricular  hypertrophy. Left ventricular diastolic parameters are consistent with  Grade II diastolic dysfunction (pseudonormalization). Elevated left  ventricular end-diastolic pressure.   2. Right ventricular systolic function is normal. The right ventricular  size is normal. Tricuspid regurgitation signal is inadequate for assessing  PA pressure.   3. Left atrial size was mildly dilated.   4. Right atrial size was mildly dilated.   5. The mitral valve is normal in structure. No evidence of mitral valve  regurgitation. No evidence of mitral stenosis.   6. The aortic valve was not well visualized.  Aortic valve regurgitation  is not visualized.   7. The inferior vena cava is normal in size with greater than 50%  respiratory variability, suggesting right atrial pressure of 3 mmHg.   EKG:  EKG is not ordered today.    Recent Labs: 08/02/2021: ALT 14 08/05/2021: BUN 7; Potassium 3.6; Sodium 135 08/07/2021: Hemoglobin 8.7; Platelets 287 08/09/2021: Creatinine, Ser 0.94    Lipid Panel No results found for: "CHOL", "TRIG", "HDL", "CHOLHDL", "VLDL", "LDLCALC", "LDLDIRECT"    Wt Readings from Last 3 Encounters:  06/07/22 240 lb (108.9 kg)  05/05/22 245 lb 3.2 oz (111.2 kg)  09/05/21 232 lb (105.2 kg)      Other studies Reviewed: Additional studies/ records that were reviewed today include:   Extensive review of recent records. Review of the above records demonstrates:  Please see elsewhere in the note.     ASSESSMENT AND PLAN:  Chronic diastolic heart failure (HCC):   The patient seems to be euvolemic.  No change in therapy.  He is up-to-date with blood work.  PAD (peripheral artery disease) (HCC): He will continue with risk reduction and working with his prosthesis to increase his ambulation.  Cardiac pacemaker in situ: He is up-to-date with  follow-up.   Hyperlipidemia:   LDL was 60 with an HDL of 37.  No change in therapy.    CAD: He has no active ischemia.  No change in therapy.  Current medicines are reviewed at length with the patient today.  The patient does not have concerns regarding medicines.  The following changes have been made:  no change  Labs/ tests ordered today include: None No orders of the defined types were placed in this encounter.    Disposition:   FU with me in one year.  He would like to just follow-up 1 cardiologist if we can arrange this with his EP follow-up.   Signed, Rollene Rotunda, MD  06/07/2022 1:01 PM    [facility]

## 2022-06-07 ENCOUNTER — Ambulatory Visit: Payer: PPO | Admitting: Cardiology

## 2022-06-07 ENCOUNTER — Encounter: Payer: Self-pay | Admitting: Cardiology

## 2022-06-07 VITALS — BP 139/74 | HR 64 | Ht 68.0 in | Wt 240.0 lb

## 2022-06-07 DIAGNOSIS — Z950 Presence of cardiac pacemaker: Secondary | ICD-10-CM | POA: Diagnosis not present

## 2022-06-07 DIAGNOSIS — I5032 Chronic diastolic (congestive) heart failure: Secondary | ICD-10-CM

## 2022-06-07 DIAGNOSIS — I739 Peripheral vascular disease, unspecified: Secondary | ICD-10-CM

## 2022-06-07 DIAGNOSIS — E785 Hyperlipidemia, unspecified: Secondary | ICD-10-CM

## 2022-06-07 NOTE — Patient Instructions (Signed)

## 2022-06-26 ENCOUNTER — Ambulatory Visit (INDEPENDENT_AMBULATORY_CARE_PROVIDER_SITE_OTHER): Payer: PPO

## 2022-06-26 DIAGNOSIS — I5032 Chronic diastolic (congestive) heart failure: Secondary | ICD-10-CM

## 2022-06-26 DIAGNOSIS — I442 Atrioventricular block, complete: Secondary | ICD-10-CM

## 2022-06-28 ENCOUNTER — Other Ambulatory Visit (HOSPITAL_BASED_OUTPATIENT_CLINIC_OR_DEPARTMENT_OTHER): Payer: Self-pay

## 2022-06-28 LAB — CUP PACEART REMOTE DEVICE CHECK
Battery Remaining Longevity: 43 mo
Battery Voltage: 2.97 V
Brady Statistic RV Percent Paced: 99.69 %
Date Time Interrogation Session: 20230829115532
Implantable Lead Implant Date: 20160325
Implantable Lead Location: 753860
Implantable Lead Model: 5076
Implantable Pulse Generator Implant Date: 20160325
Lead Channel Impedance Value: 494 Ohm
Lead Channel Impedance Value: 513 Ohm
Lead Channel Pacing Threshold Amplitude: 0.625 V
Lead Channel Pacing Threshold Pulse Width: 0.4 ms
Lead Channel Sensing Intrinsic Amplitude: 15 mV
Lead Channel Sensing Intrinsic Amplitude: 15 mV
Lead Channel Setting Pacing Amplitude: 2.5 V
Lead Channel Setting Pacing Pulse Width: 0.4 ms
Lead Channel Setting Sensing Sensitivity: 0.9 mV

## 2022-07-19 NOTE — Progress Notes (Signed)
Remote pacemaker transmission.   

## 2022-08-30 ENCOUNTER — Other Ambulatory Visit (HOSPITAL_BASED_OUTPATIENT_CLINIC_OR_DEPARTMENT_OTHER): Payer: Self-pay

## 2022-09-25 ENCOUNTER — Ambulatory Visit (INDEPENDENT_AMBULATORY_CARE_PROVIDER_SITE_OTHER): Payer: PPO

## 2022-09-25 DIAGNOSIS — I442 Atrioventricular block, complete: Secondary | ICD-10-CM

## 2022-09-26 LAB — CUP PACEART REMOTE DEVICE CHECK
Battery Remaining Longevity: 39 mo
Battery Voltage: 2.96 V
Brady Statistic RV Percent Paced: 99.5 %
Date Time Interrogation Session: 20231128145105
Implantable Lead Connection Status: 753985
Implantable Lead Implant Date: 20160325
Implantable Lead Location: 753860
Implantable Lead Model: 5076
Implantable Pulse Generator Implant Date: 20160325
Lead Channel Impedance Value: 475 Ohm
Lead Channel Impedance Value: 513 Ohm
Lead Channel Pacing Threshold Amplitude: 0.5 V
Lead Channel Pacing Threshold Pulse Width: 0.4 ms
Lead Channel Sensing Intrinsic Amplitude: 14.625 mV
Lead Channel Sensing Intrinsic Amplitude: 14.625 mV
Lead Channel Setting Pacing Amplitude: 2.5 V
Lead Channel Setting Pacing Pulse Width: 0.4 ms
Lead Channel Setting Sensing Sensitivity: 0.9 mV
Zone Setting Status: 755011

## 2022-11-06 NOTE — Progress Notes (Signed)
Remote pacemaker transmission.   

## 2022-12-27 ENCOUNTER — Ambulatory Visit (INDEPENDENT_AMBULATORY_CARE_PROVIDER_SITE_OTHER): Payer: PPO

## 2022-12-27 DIAGNOSIS — I442 Atrioventricular block, complete: Secondary | ICD-10-CM

## 2023-01-01 LAB — CUP PACEART REMOTE DEVICE CHECK
Battery Remaining Longevity: 43 mo
Battery Voltage: 2.96 V
Brady Statistic RV Percent Paced: 99.61 %
Date Time Interrogation Session: 20240301122134
Implantable Lead Connection Status: 753985
Implantable Lead Implant Date: 20160325
Implantable Lead Location: 753860
Implantable Lead Model: 5076
Implantable Pulse Generator Implant Date: 20160325
Lead Channel Impedance Value: 494 Ohm
Lead Channel Impedance Value: 513 Ohm
Lead Channel Pacing Threshold Amplitude: 0.625 V
Lead Channel Pacing Threshold Pulse Width: 0.4 ms
Lead Channel Sensing Intrinsic Amplitude: 12.125 mV
Lead Channel Sensing Intrinsic Amplitude: 12.125 mV
Lead Channel Setting Pacing Amplitude: 2.5 V
Lead Channel Setting Pacing Pulse Width: 0.4 ms
Lead Channel Setting Sensing Sensitivity: 0.9 mV
Zone Setting Status: 755011

## 2023-01-29 NOTE — Progress Notes (Signed)
Remote pacemaker transmission.   

## 2023-03-28 ENCOUNTER — Ambulatory Visit (INDEPENDENT_AMBULATORY_CARE_PROVIDER_SITE_OTHER): Payer: PPO

## 2023-03-28 DIAGNOSIS — I442 Atrioventricular block, complete: Secondary | ICD-10-CM

## 2023-03-30 LAB — CUP PACEART REMOTE DEVICE CHECK
Battery Remaining Longevity: 38 mo
Battery Voltage: 2.95 V
Brady Statistic RV Percent Paced: 99.62 %
Date Time Interrogation Session: 20240531091923
Implantable Lead Connection Status: 753985
Implantable Lead Implant Date: 20160325
Implantable Lead Location: 753860
Implantable Lead Model: 5076
Implantable Pulse Generator Implant Date: 20160325
Lead Channel Impedance Value: 475 Ohm
Lead Channel Impedance Value: 513 Ohm
Lead Channel Pacing Threshold Amplitude: 0.625 V
Lead Channel Pacing Threshold Pulse Width: 0.4 ms
Lead Channel Sensing Intrinsic Amplitude: 13.875 mV
Lead Channel Sensing Intrinsic Amplitude: 13.875 mV
Lead Channel Setting Pacing Amplitude: 2.5 V
Lead Channel Setting Pacing Pulse Width: 0.4 ms
Lead Channel Setting Sensing Sensitivity: 0.9 mV
Zone Setting Status: 755011

## 2023-04-19 NOTE — Progress Notes (Signed)
Remote pacemaker transmission.   

## 2023-05-04 ENCOUNTER — Encounter: Payer: Self-pay | Admitting: Cardiovascular Disease

## 2023-05-04 ENCOUNTER — Ambulatory Visit: Payer: PPO | Attending: Cardiovascular Disease | Admitting: Cardiovascular Disease

## 2023-05-04 VITALS — BP 152/80 | HR 58 | Ht 69.0 in | Wt 270.0 lb

## 2023-05-04 DIAGNOSIS — I5032 Chronic diastolic (congestive) heart failure: Secondary | ICD-10-CM

## 2023-05-04 LAB — CUP PACEART INCLINIC DEVICE CHECK
Battery Remaining Longevity: 39 mo
Battery Voltage: 2.95 V
Brady Statistic RV Percent Paced: 99.62 %
Date Time Interrogation Session: 20240705150023
Implantable Lead Connection Status: 753985
Implantable Lead Implant Date: 20160325
Implantable Lead Location: 753860
Implantable Lead Model: 5076
Implantable Pulse Generator Implant Date: 20160325
Lead Channel Impedance Value: 494 Ohm
Lead Channel Impedance Value: 532 Ohm
Lead Channel Pacing Threshold Amplitude: 0.625 V
Lead Channel Pacing Threshold Pulse Width: 0.4 ms
Lead Channel Sensing Intrinsic Amplitude: 10.75 mV
Lead Channel Sensing Intrinsic Amplitude: 10.75 mV
Lead Channel Setting Pacing Amplitude: 2.5 V
Lead Channel Setting Pacing Pulse Width: 0.4 ms
Lead Channel Setting Sensing Sensitivity: 0.9 mV
Zone Setting Status: 755011

## 2023-05-04 MED ORDER — NITROGLYCERIN 0.4 MG SL SUBL: 0.4000 mg | SUBLINGUAL_TABLET | SUBLINGUAL | 3 refills | Status: AC | PRN

## 2023-05-04 NOTE — Progress Notes (Signed)
    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  Since last being seen in our clinic, the patient reports doing very well.    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    Today, he denies symptoms of palpitations, chest pain, shortness of breath,  lower extremity edema, dizziness, presyncope, or syncope. he has no device related complaints -- no new tenderness, drainage, redness. The patient is otherwise without complaint today.      Physical Exam: Vitals:   05/04/23 0847  BP: (!) 152/80  Pulse: (!) 58  SpO2: (!) 88%  Weight: 270 lb (122.5 kg)  Height: 5\' 9"  (1.753 m)     Gen: Appears comfortable, well-nourished CV: RRR, no dependent edema - s/p R BKA The device site is normal -- no tenderness, edema, drainage, redness, threatened erosion.  Pulm: breathing easily  Pacemaker interrogation- reviewed in detail today,  See PACEART report  ekg tracing ordered today is personally reviewed and shows sinus with AV dissociation, V paced  Assessment and Plan:  1. Symptomatic complete heart block Normal pacemaker function See Pace Art report No changes today he is device dependant today At time of implant, only a ventricular lead could be placed due to venous anatomy issues. He has done great with VVI pacing and currently we do not plan to upgrade his device.  2. HTN BP elevated today No change required today  3. CAD No ischemic symptoms  4. Obesity Body mass index is 39.87 kg/m. Lifestyle modification advised He is making progress  Return in a year  Nathan Small, MD  05/04/2023 9:14 AM

## 2023-05-04 NOTE — Patient Instructions (Signed)
Medication Instructions:   Nitroglycerin refilled today  Continue all other medications.     Labwork:  none  Testing/Procedures:  none  Follow-Up:  Your physician wants you to follow up in:  1 year.  You should receive a recall letter in the mail about 2 months prior to the time you are due.  If you don't receive this, please call our office to schedule your follow up appointment.      Any Other Special Instructions Will Be Listed Below (If Applicable).   If you need a refill on your cardiac medications before your next appointment, please call your pharmacy.  

## 2023-06-25 ENCOUNTER — Other Ambulatory Visit (HOSPITAL_BASED_OUTPATIENT_CLINIC_OR_DEPARTMENT_OTHER): Payer: Self-pay

## 2023-06-27 ENCOUNTER — Ambulatory Visit (INDEPENDENT_AMBULATORY_CARE_PROVIDER_SITE_OTHER): Payer: PPO

## 2023-06-27 DIAGNOSIS — I442 Atrioventricular block, complete: Secondary | ICD-10-CM

## 2023-06-28 LAB — CUP PACEART REMOTE DEVICE CHECK
Battery Remaining Longevity: 34 mo
Battery Voltage: 2.95 V
Brady Statistic RV Percent Paced: 99.69 %
Date Time Interrogation Session: 20240829130828
Implantable Lead Connection Status: 753985
Implantable Lead Implant Date: 20160325
Implantable Lead Location: 753860
Implantable Lead Model: 5076
Implantable Pulse Generator Implant Date: 20160325
Lead Channel Impedance Value: 456 Ohm
Lead Channel Impedance Value: 494 Ohm
Lead Channel Pacing Threshold Amplitude: 0.625 V
Lead Channel Pacing Threshold Pulse Width: 0.4 ms
Lead Channel Sensing Intrinsic Amplitude: 19.75 mV
Lead Channel Sensing Intrinsic Amplitude: 19.75 mV
Lead Channel Setting Pacing Amplitude: 2.5 V
Lead Channel Setting Pacing Pulse Width: 0.4 ms
Lead Channel Setting Sensing Sensitivity: 0.9 mV
Zone Setting Status: 755011

## 2023-07-06 NOTE — Progress Notes (Signed)
Remote pacemaker transmission.   

## 2023-07-30 ENCOUNTER — Encounter (HOSPITAL_COMMUNITY): Payer: Self-pay | Admitting: *Deleted

## 2023-07-30 ENCOUNTER — Inpatient Hospital Stay (HOSPITAL_COMMUNITY)
Admission: EM | Admit: 2023-07-30 | Discharge: 2023-08-02 | DRG: 291 | Disposition: A | Discharge status: Home Health | Payer: PPO | Attending: Internal Medicine | Admitting: Internal Medicine

## 2023-07-30 ENCOUNTER — Emergency Department (HOSPITAL_COMMUNITY): Payer: PPO

## 2023-07-30 ENCOUNTER — Inpatient Hospital Stay (HOSPITAL_COMMUNITY): Payer: PPO

## 2023-07-30 ENCOUNTER — Other Ambulatory Visit: Payer: Self-pay

## 2023-07-30 DIAGNOSIS — E871 Hypo-osmolality and hyponatremia: Secondary | ICD-10-CM | POA: Diagnosis present

## 2023-07-30 DIAGNOSIS — Z9981 Dependence on supplemental oxygen: Secondary | ICD-10-CM | POA: Diagnosis not present

## 2023-07-30 DIAGNOSIS — Z87891 Personal history of nicotine dependence: Secondary | ICD-10-CM

## 2023-07-30 DIAGNOSIS — Z825 Family history of asthma and other chronic lower respiratory diseases: Secondary | ICD-10-CM

## 2023-07-30 DIAGNOSIS — I442 Atrioventricular block, complete: Secondary | ICD-10-CM | POA: Diagnosis present

## 2023-07-30 DIAGNOSIS — G4710 Hypersomnia, unspecified: Secondary | ICD-10-CM | POA: Diagnosis present

## 2023-07-30 DIAGNOSIS — Z950 Presence of cardiac pacemaker: Secondary | ICD-10-CM

## 2023-07-30 DIAGNOSIS — Z91199 Patient's noncompliance with other medical treatment and regimen due to unspecified reason: Secondary | ICD-10-CM

## 2023-07-30 DIAGNOSIS — I110 Hypertensive heart disease with heart failure: Principal | ICD-10-CM | POA: Diagnosis present

## 2023-07-30 DIAGNOSIS — I5033 Acute on chronic diastolic (congestive) heart failure: Secondary | ICD-10-CM | POA: Diagnosis present

## 2023-07-30 DIAGNOSIS — J9811 Atelectasis: Secondary | ICD-10-CM | POA: Diagnosis present

## 2023-07-30 DIAGNOSIS — I252 Old myocardial infarction: Secondary | ICD-10-CM

## 2023-07-30 DIAGNOSIS — E1151 Type 2 diabetes mellitus with diabetic peripheral angiopathy without gangrene: Secondary | ICD-10-CM | POA: Diagnosis present

## 2023-07-30 DIAGNOSIS — Z933 Colostomy status: Secondary | ICD-10-CM

## 2023-07-30 DIAGNOSIS — Z7984 Long term (current) use of oral hypoglycemic drugs: Secondary | ICD-10-CM

## 2023-07-30 DIAGNOSIS — R7881 Bacteremia: Secondary | ICD-10-CM

## 2023-07-30 DIAGNOSIS — J9621 Acute and chronic respiratory failure with hypoxia: Principal | ICD-10-CM | POA: Diagnosis present

## 2023-07-30 DIAGNOSIS — Z8249 Family history of ischemic heart disease and other diseases of the circulatory system: Secondary | ICD-10-CM | POA: Diagnosis not present

## 2023-07-30 DIAGNOSIS — Z7982 Long term (current) use of aspirin: Secondary | ICD-10-CM

## 2023-07-30 DIAGNOSIS — J471 Bronchiectasis with (acute) exacerbation: Secondary | ICD-10-CM | POA: Diagnosis present

## 2023-07-30 DIAGNOSIS — E1165 Type 2 diabetes mellitus with hyperglycemia: Secondary | ICD-10-CM | POA: Diagnosis present

## 2023-07-30 DIAGNOSIS — J9620 Acute and chronic respiratory failure, unspecified whether with hypoxia or hypercapnia: Secondary | ICD-10-CM | POA: Diagnosis present

## 2023-07-30 DIAGNOSIS — Z91119 Patient's noncompliance with dietary regimen due to unspecified reason: Secondary | ICD-10-CM

## 2023-07-30 DIAGNOSIS — Z89511 Acquired absence of right leg below knee: Secondary | ICD-10-CM | POA: Diagnosis not present

## 2023-07-30 DIAGNOSIS — I2510 Atherosclerotic heart disease of native coronary artery without angina pectoris: Secondary | ICD-10-CM | POA: Diagnosis present

## 2023-07-30 DIAGNOSIS — E669 Obesity, unspecified: Secondary | ICD-10-CM | POA: Diagnosis present

## 2023-07-30 DIAGNOSIS — I10 Essential (primary) hypertension: Secondary | ICD-10-CM | POA: Diagnosis not present

## 2023-07-30 DIAGNOSIS — K439 Ventral hernia without obstruction or gangrene: Secondary | ICD-10-CM | POA: Diagnosis present

## 2023-07-30 DIAGNOSIS — E66813 Obesity, class 3: Secondary | ICD-10-CM | POA: Diagnosis present

## 2023-07-30 DIAGNOSIS — Z1152 Encounter for screening for COVID-19: Secondary | ICD-10-CM | POA: Diagnosis not present

## 2023-07-30 DIAGNOSIS — Z79899 Other long term (current) drug therapy: Secondary | ICD-10-CM | POA: Diagnosis not present

## 2023-07-30 DIAGNOSIS — E785 Hyperlipidemia, unspecified: Secondary | ICD-10-CM | POA: Diagnosis present

## 2023-07-30 DIAGNOSIS — J441 Chronic obstructive pulmonary disease with (acute) exacerbation: Secondary | ICD-10-CM | POA: Diagnosis present

## 2023-07-30 DIAGNOSIS — Z6841 Body Mass Index (BMI) 40.0 and over, adult: Secondary | ICD-10-CM

## 2023-07-30 DIAGNOSIS — Z794 Long term (current) use of insulin: Secondary | ICD-10-CM | POA: Diagnosis not present

## 2023-07-30 DIAGNOSIS — Z91148 Patient's other noncompliance with medication regimen for other reason: Secondary | ICD-10-CM

## 2023-07-30 DIAGNOSIS — Z9861 Coronary angioplasty status: Secondary | ICD-10-CM

## 2023-07-30 HISTORY — DX: Chronic obstructive pulmonary disease, unspecified: J44.9

## 2023-07-30 HISTORY — DX: Unspecified diastolic (congestive) heart failure: I50.30

## 2023-07-30 HISTORY — DX: Atrioventricular block, complete: I44.2

## 2023-07-30 LAB — HEMOGLOBIN A1C
Hgb A1c MFr Bld: 5.8 % — ABNORMAL HIGH (ref 4.8–5.6)
Mean Plasma Glucose: 119.76 mg/dL

## 2023-07-30 LAB — BASIC METABOLIC PANEL
Anion gap: 10 (ref 5–15)
BUN: 10 mg/dL (ref 8–23)
CO2: 35 mmol/L — ABNORMAL HIGH (ref 22–32)
Calcium: 8.7 mg/dL — ABNORMAL LOW (ref 8.9–10.3)
Chloride: 82 mmol/L — ABNORMAL LOW (ref 98–111)
Creatinine, Ser: 0.88 mg/dL (ref 0.61–1.24)
GFR, Estimated: >60 mL/min (ref >60)
Glucose, Bld: 114 mg/dL — ABNORMAL HIGH (ref 70–99)
Potassium: 4.6 mmol/L (ref 3.5–5.1)
Sodium: 127 mmol/L — ABNORMAL LOW (ref 135–145)

## 2023-07-30 LAB — ECHOCARDIOGRAM COMPLETE
Est EF: 55
Height: 67 in
S' Lateral: 4.6 cm
Weight: 4320 [oz_av]

## 2023-07-30 LAB — CBC
HCT: 39.6 % (ref 39.0–52.0)
Hemoglobin: 12.5 g/dL — ABNORMAL LOW (ref 13.0–17.0)
MCH: 28.4 pg (ref 26.0–34.0)
MCHC: 31.6 g/dL (ref 30.0–36.0)
MCV: 90 fL (ref 80.0–100.0)
Platelets: 274 10*3/uL (ref 150–400)
RBC: 4.4 MIL/uL (ref 4.22–5.81)
RDW: 16.3 % — ABNORMAL HIGH (ref 11.5–15.5)
WBC: 7.7 10*3/uL (ref 4.0–10.5)
nRBC: 0 % (ref 0.0–0.2)

## 2023-07-30 LAB — TROPONIN I (HIGH SENSITIVITY)
Troponin I (High Sensitivity): 12 ng/L (ref <18)
Troponin I (High Sensitivity): 11 ng/L (ref <18)

## 2023-07-30 LAB — RESP PANEL BY RT-PCR (RSV, FLU A&B, COVID)  RVPGX2
Influenza A by PCR: NEGATIVE
Influenza B by PCR: NEGATIVE
Resp Syncytial Virus by PCR: NEGATIVE
SARS Coronavirus 2 by RT PCR: NEGATIVE

## 2023-07-30 LAB — TSH: TSH: 3.517 u[IU]/mL (ref 0.350–4.500)

## 2023-07-30 LAB — GLUCOSE, CAPILLARY
Glucose-Capillary: 214 mg/dL — ABNORMAL HIGH (ref 70–99)
Glucose-Capillary: 184 mg/dL — ABNORMAL HIGH (ref 70–99)

## 2023-07-30 LAB — BRAIN NATRIURETIC PEPTIDE: B Natriuretic Peptide: 712 pg/mL — ABNORMAL HIGH (ref 0.0–100.0)

## 2023-07-30 MED ORDER — PERFLUTREN LIPID MICROSPHERE
1.0000 mL | INTRAVENOUS | Status: AC | PRN
  Administered 2023-07-30: 5 mL via INTRAVENOUS

## 2023-07-30 MED ORDER — ACETAMINOPHEN 650 MG RE SUPP: 650.0000 mg | Freq: Four times a day (QID) | RECTAL | Status: DC | PRN

## 2023-07-30 MED ORDER — METHYLPREDNISOLONE SODIUM SUCC 40 MG IJ SOLR
40.0000 mg | Freq: Two times a day (BID) | INTRAMUSCULAR | Status: DC
  Administered 2023-07-31 – 2023-08-01 (×4): 40 mg via INTRAVENOUS
  Filled 2023-07-30 (×4): qty 1

## 2023-07-30 MED ORDER — INSULIN ASPART 100 UNIT/ML IJ SOLN
0.0000 [IU] | Freq: Every day | INTRAMUSCULAR | Status: DC
  Administered 2023-08-01: 3 [IU] via SUBCUTANEOUS

## 2023-07-30 MED ORDER — ONDANSETRON HCL 4 MG/2ML IJ SOLN: 4.0000 mg | Freq: Four times a day (QID) | INTRAMUSCULAR | Status: DC | PRN

## 2023-07-30 MED ORDER — ASPIRIN 81 MG PO TBEC
81.0000 mg | DELAYED_RELEASE_TABLET | Freq: Every day | ORAL | Status: DC
  Administered 2023-07-31 – 2023-08-02 (×3): 81 mg via ORAL
  Filled 2023-07-30 (×3): qty 1

## 2023-07-30 MED ORDER — IPRATROPIUM-ALBUTEROL 0.5-2.5 (3) MG/3ML IN SOLN
3.0000 mL | Freq: Three times a day (TID) | RESPIRATORY_TRACT | Status: DC
  Administered 2023-07-30: 3 mL via RESPIRATORY_TRACT
  Filled 2023-07-30 (×3): qty 3

## 2023-07-30 MED ORDER — SODIUM CHLORIDE 0.9% FLUSH: 3.0000 mL | INTRAVENOUS | Status: DC | PRN

## 2023-07-30 MED ORDER — ARFORMOTEROL TARTRATE 15 MCG/2ML IN NEBU
15.0000 ug | INHALATION_SOLUTION | Freq: Two times a day (BID) | RESPIRATORY_TRACT | Status: DC
  Administered 2023-07-30 – 2023-08-02 (×6): 15 ug via RESPIRATORY_TRACT
  Filled 2023-07-30 (×6): qty 2

## 2023-07-30 MED ORDER — ENOXAPARIN SODIUM 60 MG/0.6ML IJ SOSY
60.0000 mg | PREFILLED_SYRINGE | INTRAMUSCULAR | Status: DC
  Administered 2023-07-31 – 2023-08-01 (×3): 60 mg via SUBCUTANEOUS
  Filled 2023-07-30 (×3): qty 0.6

## 2023-07-30 MED ORDER — SODIUM CHLORIDE 0.9% FLUSH
3.0000 mL | Freq: Two times a day (BID) | INTRAVENOUS | Status: DC
  Administered 2023-07-30 – 2023-08-01 (×6): 3 mL via INTRAVENOUS

## 2023-07-30 MED ORDER — CARVEDILOL 12.5 MG PO TABS
25.0000 mg | ORAL_TABLET | Freq: Two times a day (BID) | ORAL | Status: DC
  Administered 2023-07-30 – 2023-08-02 (×6): 25 mg via ORAL
  Filled 2023-07-30 (×6): qty 2

## 2023-07-30 MED ORDER — SODIUM CHLORIDE 0.9 % IV SOLN: 250.0000 mL | INTRAVENOUS | Status: DC | PRN

## 2023-07-30 MED ORDER — IPRATROPIUM-ALBUTEROL 0.5-2.5 (3) MG/3ML IN SOLN
3.0000 mL | Freq: Three times a day (TID) | RESPIRATORY_TRACT | Status: DC
  Administered 2023-07-30 – 2023-07-31 (×4): 3 mL via RESPIRATORY_TRACT
  Filled 2023-07-30 (×3): qty 3

## 2023-07-30 MED ORDER — DOXYCYCLINE HYCLATE 100 MG PO TABS
100.0000 mg | ORAL_TABLET | Freq: Two times a day (BID) | ORAL | Status: DC
  Administered 2023-07-30 – 2023-08-02 (×6): 100 mg via ORAL
  Filled 2023-07-30 (×8): qty 1

## 2023-07-30 MED ORDER — IPRATROPIUM-ALBUTEROL 0.5-2.5 (3) MG/3ML IN SOLN
3.0000 mL | Freq: Once | RESPIRATORY_TRACT | Status: AC
  Administered 2023-07-30: 3 mL via RESPIRATORY_TRACT
  Filled 2023-07-30: qty 3

## 2023-07-30 MED ORDER — INSULIN ASPART 100 UNIT/ML IJ SOLN
0.0000 [IU] | Freq: Three times a day (TID) | INTRAMUSCULAR | Status: DC
  Administered 2023-07-30: 5 [IU] via SUBCUTANEOUS
  Administered 2023-08-01: 3 [IU] via SUBCUTANEOUS
  Administered 2023-08-02: 2 [IU] via SUBCUTANEOUS

## 2023-07-30 MED ORDER — DM-GUAIFENESIN ER 30-600 MG PO TB12
1.0000 | ORAL_TABLET | Freq: Two times a day (BID) | ORAL | Status: DC
  Administered 2023-07-30 – 2023-08-02 (×7): 1 via ORAL
  Filled 2023-07-30 (×7): qty 1

## 2023-07-30 MED ORDER — FUROSEMIDE 10 MG/ML IJ SOLN
20.0000 mg | Freq: Once | INTRAMUSCULAR | Status: AC
  Administered 2023-07-30: 20 mg via INTRAVENOUS
  Filled 2023-07-30: qty 2

## 2023-07-30 MED ORDER — FUROSEMIDE 10 MG/ML IJ SOLN
40.0000 mg | Freq: Two times a day (BID) | INTRAMUSCULAR | Status: DC
  Administered 2023-07-31 – 2023-08-02 (×6): 40 mg via INTRAVENOUS
  Filled 2023-07-30 (×6): qty 4

## 2023-07-30 MED ORDER — TORSEMIDE 20 MG PO TABS: 20.0000 mg | ORAL_TABLET | Freq: Every day | ORAL | Status: DC

## 2023-07-30 MED ORDER — BUDESONIDE 0.5 MG/2ML IN SUSP
0.5000 mg | Freq: Two times a day (BID) | RESPIRATORY_TRACT | Status: DC
  Administered 2023-07-30 – 2023-08-02 (×6): 0.5 mg via RESPIRATORY_TRACT
  Filled 2023-07-30 (×6): qty 2

## 2023-07-30 MED ORDER — METHYLPREDNISOLONE SODIUM SUCC 125 MG IJ SOLR
60.0000 mg | Freq: Once | INTRAMUSCULAR | Status: AC
  Administered 2023-07-30: 60 mg via INTRAVENOUS
  Filled 2023-07-30: qty 2

## 2023-07-30 MED ORDER — ACETAMINOPHEN 325 MG PO TABS: 650.0000 mg | ORAL_TABLET | Freq: Four times a day (QID) | ORAL | Status: DC | PRN

## 2023-07-30 MED ORDER — ONDANSETRON HCL 4 MG PO TABS: 4.0000 mg | ORAL_TABLET | Freq: Four times a day (QID) | ORAL | Status: DC | PRN

## 2023-07-30 MED ORDER — INSULIN GLARGINE-YFGN 100 UNIT/ML ~~LOC~~ SOLN
7.0000 [IU] | Freq: Every day | SUBCUTANEOUS | Status: DC
  Administered 2023-07-31 – 2023-08-01 (×3): 7 [IU] via SUBCUTANEOUS
  Filled 2023-07-30 (×4): qty 0.07

## 2023-07-30 MED ORDER — ATORVASTATIN CALCIUM 40 MG PO TABS
80.0000 mg | ORAL_TABLET | Freq: Every day | ORAL | Status: DC
  Administered 2023-07-31 – 2023-08-02 (×3): 80 mg via ORAL
  Filled 2023-07-30 (×3): qty 2

## 2023-07-30 NOTE — Assessment & Plan Note (Signed)
-  Continue home antihypertensive regimen -Follow vital signs -Heart healthy/low-sodium diet discussed with patient.

## 2023-07-30 NOTE — ED Provider Notes (Signed)
[place] EMERGENCY DEPARTMENT AT [facility] Provider Note   CSN: [id number] Arrival date & time: 07/30/23  1000     History  Chief Complaint  Patient presents with   Shortness of Breath    Nathan Miller is a 72 y.o. male.  PMH of CAD, DM, complete heart block with pacemaker, CHF, COPD, chronic respiratory failure on 2 L oxygen at nighttime and as needed during the day.  Also has history of PAD status post right transtibial amputation and uses prosthetic to ambulate.  Uses inhaler this morning without relief.  Per EMS was on his baseline to his oxygen at 83% at rest, increased to 4 L   Shortness of Breath      Home Medications Prior to Admission medications   Medication Sig Start Date End Date Taking? Authorizing Provider  acetaminophen (TYLENOL) 500 MG tablet Take 500 mg by mouth every 6 (six) hours as needed for mild pain or fever.   Yes [provider]  albuterol (VENTOLIN HFA) 108 (90 Base) MCG/ACT inhaler Inhale 2 puffs into the lungs every 6 (six) hours as needed for wheezing or shortness of breath. 07/12/21  Yes [provider]  aspirin EC 81 MG tablet Take 1 tablet (81 mg total) by mouth daily. 01/25/15  Yes Short, Thea Silversmith, MD  atorvastatin (LIPITOR) 80 MG tablet Take 1 tablet (80 mg total) by mouth daily. 05/04/17  Yes Allred, Fayrene Fearing, MD  carvedilol (COREG) 25 MG tablet Take 1 tablet (25 mg total) by mouth 2 (two) times daily. 05/04/17  Yes Allred, Fayrene Fearing, MD  ferrous sulfate 325 (65 FE) MG tablet Take 325 mg by mouth every other day.   Yes [provider]  metFORMIN (GLUCOPHAGE) 500 MG tablet Take 1 tablet (500 mg total) by mouth 2 (two) times daily with a meal. 01/25/15  Yes Short, Thea Silversmith, MD  Multiple Vitamins-Minerals (CENTRUM ADULTS) TABS Take 1 tablet by mouth daily.   Yes [provider]  nitroGLYCERIN (NITROSTAT) 0.4 MG SL tablet Place 1 tablet (0.4 mg total) under the tongue every 5 (five) minutes as needed for chest pain.  05/04/23  Yes Rollene Rotunda, MD  SPIRIVA RESPIMAT 2.5 MCG/ACT AERS Inhale 2 puffs into the lungs daily. 07/27/21  Yes [provider]  torsemide (DEMADEX) 20 MG tablet Take 20 mg by mouth daily. 07/21/21  Yes [provider]      Allergies    Patient has no known allergies.    Review of Systems   Review of Systems  Respiratory:  Positive for shortness of breath.     Physical Exam Updated Vital Signs BP (!) 153/58 (BP Location: Left Arm)   Pulse (!) 51   Temp 97.7 F (36.5 C) (Oral)   Resp 17   Ht 5\' 7"  (1.702 m)   Wt 122.5 kg   SpO2 100%   BMI 42.29 kg/m  Physical Exam Vitals and nursing note reviewed.  Constitutional:      General: He is not in acute distress.    Appearance: He is well-developed.  HENT:     Head: Normocephalic and atraumatic.  Eyes:     Conjunctiva/sclera: Conjunctivae normal.  Cardiovascular:     Rate and Rhythm: Normal rate and regular rhythm.     Heart sounds: No murmur heard. Pulmonary:     Effort: Pulmonary effort is normal. No respiratory distress.     Breath sounds: Examination of the right-lower field reveals wheezing. Examination of the left-lower field reveals wheezing. Wheezing present.  No rhonchi or rales.  Abdominal:     Palpations: Abdomen is soft.     Tenderness: There is no abdominal tenderness.  Musculoskeletal:        General: No swelling. Normal range of motion.     Cervical back: Neck supple.     Left lower leg: Edema present.     Comments: 3+ pitting edema left lower extremity  Skin:    General: Skin is warm and dry.     Capillary Refill: Capillary refill takes less than 2 seconds.  Neurological:     General: No focal deficit present.     Mental Status: He is alert and oriented to person, place, and time.  Psychiatric:        Mood and Affect: Mood normal.     ED Results / Procedures / Treatments   Labs (all labs ordered are listed, but only abnormal results are displayed) Labs Reviewed  BASIC  METABOLIC PANEL - Abnormal; Notable for the following components:      Result Value   Sodium 127 (*)    Chloride 82 (*)    CO2 35 (*)    Glucose, Bld 114 (*)    Calcium 8.7 (*)    All other components within normal limits  CBC - Abnormal; Notable for the following components:   Hemoglobin 12.5 (*)    RDW 16.3 (*)    All other components within normal limits  BRAIN NATRIURETIC PEPTIDE - Abnormal; Notable for the following components:   B Natriuretic Peptide 712.0 (*)    All other components within normal limits  RESP PANEL BY RT-PCR (RSV, FLU A&B, COVID)  RVPGX2  HIV ANTIBODY (ROUTINE TESTING W REFLEX)  TSH  HEMOGLOBIN A1C  TROPONIN I (HIGH SENSITIVITY)  TROPONIN I (HIGH SENSITIVITY)    EKG None  Radiology ECHOCARDIOGRAM COMPLETE  Result Date: 07/30/2023    ECHOCARDIOGRAM REPORT   Patient Name:   Nathan Miller Date of Exam: 07/30/2023 Medical Rec #:  [id number]    Height:       67.0 in Accession #:    [id number]   Weight:       270.0 lb Date of Birth:  1951/07/21   BSA:          2.297 m Patient Age:    73 years     BP:           107/74 mmHg Patient Gender: M            HR:           70 bpm. Exam Location:  Inpatient Procedure: 2D Echo, Cardiac Doppler, Color Doppler and Intracardiac            Opacification Agent Indications:    Bacteremia R78.81  History:        Patient has prior history of Echocardiogram examinations, most                 recent 03/31/2021.  Sonographer:    Harriette Bouillon RDCS Referring Phys: [phone] CARLOS MADERA IMPRESSIONS  1. Left ventricular ejection fraction, by estimation, is approximately 55%. The left ventricle has normal function. The left ventricle has no regional wall motion abnormalities. There is mild left ventricular hypertrophy. Left ventricular diastolic parameters are consistent with Grade I diastolic dysfunction (impaired relaxation).  2. Right ventricular systolic function is normal. The right ventricular size is normal. Tricuspid regurgitation signal is  inadequate for assessing PA pressure.  3. Left atrial size was mildly dilated.  4. Right atrial size was mildly dilated.  5. The mitral valve is grossly normal. Trivial mitral valve regurgitation.  6. The aortic valve is tricuspid. Aortic valve regurgitation is not visualized.  7. The inferior vena cava is normal in size with <50% respiratory variability, suggesting right atrial pressure of 8 mmHg.  8. No obvious valvular vegetations. Comparison(s): Prior images reviewed side by side. LVEF normal at approximately 55%. FINDINGS  Left Ventricle: Left ventricular ejection fraction, by estimation, is 55%. The left ventricle has normal function. The left ventricle has no regional wall motion abnormalities. Definity contrast agent was given IV to delineate the left ventricular endocardial borders. The left ventricular internal cavity size was normal in size. There is mild left ventricular hypertrophy. Abnormal (paradoxical) septal motion, consistent with RV pacemaker. Left ventricular diastolic parameters are consistent with Grade I diastolic dysfunction (impaired relaxation). Right Ventricle: The right ventricular size is normal. No increase in right ventricular wall thickness. Right ventricular systolic function is normal. Tricuspid regurgitation signal is inadequate for assessing PA pressure. Left Atrium: Left atrial size was mildly dilated. Right Atrium: Right atrial size was mildly dilated. Pericardium: Trivial pericardial effusion is present. The pericardial effusion is posterior to the left ventricle. Presence of epicardial fat layer. Mitral Valve: The mitral valve is grossly normal. Mild mitral annular calcification. Trivial mitral valve regurgitation. Tricuspid Valve: The tricuspid valve is grossly normal. Tricuspid valve regurgitation is trivial. Aortic Valve: The aortic valve is tricuspid. There is mild aortic valve annular calcification. Aortic valve regurgitation is not visualized. Pulmonic Valve: The  pulmonic valve was not well visualized. Pulmonic valve regurgitation is trivial. Aorta: The aortic root is normal in size and structure. Venous: The inferior vena cava is normal in size with less than 50% respiratory variability, suggesting right atrial pressure of 8 mmHg. IAS/Shunts: No atrial level shunt detected by color flow Doppler. Additional Comments: A device lead is visualized.  LEFT VENTRICLE PLAX 2D LVIDd:         6.20 cm LVIDs:         4.60 cm LV PW:         1.20 cm LV IVS:        1.30 cm LVOT diam:     2.40 cm LV SV:         85 LV SV Index:   37 LVOT Area:     4.52 cm  RIGHT VENTRICLE         IVC TAPSE (M-mode): 1.8 cm  IVC diam: 2.10 cm LEFT ATRIUM             Index LA diam:        4.40 cm 1.92 cm/m LA Vol (A2C):   84.3 ml 36.69 ml/m LA Vol (A4C):   83.5 ml 36.35 ml/m LA Biplane Vol: 85.0 ml 37.00 ml/m  AORTIC VALVE LVOT Vmax:   91.40 cm/s LVOT Vmean:  59.900 cm/s LVOT VTI:    0.188 m  AORTA Ao Root diam: 3.20 cm Ao Asc diam:  3.10 cm  SHUNTS Systemic VTI:  0.19 m Systemic Diam: 2.40 cm Nona Dell MD Electronically signed by Nona Dell MD Signature Date/Time: 07/30/2023/4:17:35 PM    Final    DG Tibia/Fibula Left  Result Date: 07/30/2023 CLINICAL DATA:  Shortness of breath Left leg pain status post fall EXAM: LEFT TIBIA AND FIBULA - 2 VIEW COMPARISON:  None available FINDINGS: Diffuse subcutaneous edema. Circumferential ankle soft tissue swelling. Atherosclerotic changes seen throughout visualized arterial segments. No fracture or dislocation.  Advanced degenerative changes of the left knee. IMPRESSION: No fracture or dislocation of the left tibia or fibula. Electronically Signed   By: Acquanetta Belling M.D.   On: 07/30/2023 12:29   DG Chest Port 1 View  Result Date: 07/30/2023 CLINICAL DATA:  pain left leg, fall EXAM: PORTABLE CHEST - 1 VIEW COMPARISON:  04/08/2021 FINDINGS: Mild cardiomegaly.  No pulmonary vascular congestion. Increased opacity in the medial lung bases bilaterally is  likely due to atelectasis. Pneumonia considered less likely. Single lead left chest wall pacemaker is unchanged in configuration. IMPRESSION: Increased patchy opacities within the medial lung bases bilaterally is likely due to atelectasis. Pneumonia considered less likely. Electronically Signed   By: Acquanetta Belling M.D.   On: 07/30/2023 12:27   US Venous Img Lower  Left (DVT Study)  Result Date: 07/30/2023 CLINICAL DATA:  Chronic left leg edema.  Pain status post fall EXAM: LEFT LOWER EXTREMITY VENOUS DOPPLER ULTRASOUND TECHNIQUE: Gray-scale sonography with compression, as well as color and duplex ultrasound, were performed to evaluate the deep venous system(s) from the level of the common femoral vein through the popliteal and proximal calf veins. COMPARISON:  None available FINDINGS: VENOUS Normal compressibility of the common femoral, superficial femoral, and popliteal veins, as well as the visualized calf veins. Visualized portions of profunda femoral vein and great saphenous vein unremarkable. No filling defects to suggest DVT on grayscale or color Doppler imaging. Doppler waveforms show normal direction of venous flow, normal respiratory plasticity and response to augmentation. Limited views of the contralateral common femoral vein are unremarkable. OTHER None. Limitations: none IMPRESSION: No DVT of the left lower extremity. Electronically Signed   By: Acquanetta Belling M.D.   On: 07/30/2023 12:24    Procedures Procedures    Medications Ordered in ED Medications  enoxaparin (LOVENOX) injection 60 mg (has no administration in time range)  doxycycline (VIBRA-TABS) tablet 100 mg (100 mg Oral Given 07/30/23 1622)  budesonide (PULMICORT) nebulizer solution 0.5 mg (has no administration in time range)  arformoterol (BROVANA) nebulizer solution 15 mcg (has no administration in time range)  methylPREDNISolone sodium succinate (SOLU-MEDROL) 40 mg/mL injection 40 mg (has no administration in time range)   ipratropium-albuterol (DUONEB) 0.5-2.5 (3) MG/3ML nebulizer solution 3 mL (3 mLs Nebulization Given 07/30/23 1529)  dextromethorphan-guaiFENesin (MUCINEX DM) 30-600 MG per 12 hr tablet 1 tablet (1 tablet Oral Given 07/30/23 1622)  furosemide (LASIX) injection 40 mg (has no administration in time range)  sodium chloride flush (NS) 0.9 % injection 3 mL (3 mLs Intravenous Given 07/30/23 1520)  sodium chloride flush (NS) 0.9 % injection 3 mL (has no administration in time range)  0.9 %  sodium chloride infusion (has no administration in time range)  acetaminophen (TYLENOL) tablet 650 mg (has no administration in time range)    Or  acetaminophen (TYLENOL) suppository 650 mg (has no administration in time range)  ondansetron (ZOFRAN) tablet 4 mg (has no administration in time range)    Or  ondansetron (ZOFRAN) injection 4 mg (has no administration in time range)  insulin glargine-yfgn (SEMGLEE) injection 7 Units (has no administration in time range)  insulin aspart (novoLOG) injection 0-15 Units (has no administration in time range)  insulin aspart (novoLOG) injection 0-5 Units (has no administration in time range)  aspirin EC tablet 81 mg (has no administration in time range)  atorvastatin (LIPITOR) tablet 80 mg (has no administration in time range)  carvedilol (COREG) tablet 25 mg (has no administration in time range)  perflutren lipid microspheres (  DEFINITY) IV suspension (5 mLs Intravenous Given 07/30/23 1609)  ipratropium-albuterol (DUONEB) 0.5-2.5 (3) MG/3ML nebulizer solution 3 mL (3 mLs Nebulization Given 07/30/23 1102)  furosemide (LASIX) injection 20 mg (20 mg Intravenous Given 07/30/23 1353)  methylPREDNISolone sodium succinate (SOLU-MEDROL) 125 mg/2 mL injection 60 mg (60 mg Intravenous Given 07/30/23 1400)    ED Course/ Medical Decision Making/ A&P Clinical Course as of 07/30/23 1646  Mon Jul 30, 2023  1030 Patient here with acute on chronic respiratory failure, satting 83% on his  baseline O2.  EMS, 4 L she is 96 to 97% in the room and appears to be comfortable.  Does have some pitting edema to his left lower extremity.  Reports he is compliant with his torsemide.  Feels the swelling is from a fall last week and states he still having some pain.  Had some mild wheezing, do not see a documented history of COPD though patient endorses this and states he used an inhaler at home without relief.  Will give breathing treatment to see if this improves the wheezing, get labs including BNP, chest x-ray x-ray left leg and also ultrasound as there are some erythema, could be DVT with recent trauma and patient's baseline sedentary lifestyle that he reports [CB]  1328 DVT study is negative, BNP elevated, given his significant edema in his left lower extremity.  Will give IV Lasix.  He already took his torsemide and blood pressure is normal, will start with only 20 mg he states he had low blood pressure after taking Lasix in the past [CB]    Clinical Course User Index [CB] Ma Rings, PA-C                                 Medical Decision Making This patient presents to the ED for concern of shortness of breath, this involves an extensive number of treatment options, and is a complaint that carries with it a high risk of complications and morbidity.  The differential diagnosis includes CHF exacerbation, COPD, pneumonia, PE, other   Co morbidities that complicate the patient evaluation :   CHF, complete heart bloc status post pacemaker, diabetes, PAD, right BKA   Additional history obtained:  Additional history obtained from EMR External records from outside source obtained and reviewed including notes   Lab Tests:  I Ordered, and personally interpreted labs.  The pertinent results include: BNP is elevated at 712, BMP shows hyponatremia and hypochloremia likely due to hypervolemia BC shows no leukocytosis, hemoglobin much improved from previous at 12.5   Imaging Studies  ordered:  I ordered imaging studies including x-ray chest which shows bibasilar atelectasis  I independently visualized and interpreted imaging within scope of identifying emergent findings  I agree with the radiologist interpretation   Cardiac Monitoring: / EKG:  The patient was maintained on a cardiac monitor.  I personally viewed and interpreted the cardiac monitored which showed an underlying rhythm of: paced rhythm   Consultations Obtained:  I requested consultation with the hospitalist,  and discussed lab and imaging findings as well as pertinent plan - they recommend: admission   Problem List / ED Course / Critical interventions / Medication management  Chronic hypoxic respiratory failure-likely due to CHF versus COPD exacerbation-had increasing swelling of the left leg for the past several days, woke up this morning with increased shortness of breath from baseline, satting in the 80s on 2liters.  Normally wears oxygen at  night and occasionally as needed during the day but on 2 L today was still hypoxic, oxygen improved to high 90s on 4 L at rest.  Last echo in 2022 showed EF of 55 to 60%.  Patient took his torsemide this morning, will give Lasix.  Patient does report history of COPD had some mild wheezing, he is on Spiriva twice a day which usually controls the symptoms, breathing does feel somewhat better after breathing treatments also given steroids.  Blood work reveals left leg to ensure there is no DVT because there is some trauma few days ago and some mild erythema, DVT negative.  Patient and his wife confirm that the redness is around his baseline, do not cellulitis at this time.  I have reviewed the patients home medicines and have made adjustments as needed   Amount and/or Complexity of Data Reviewed Labs: ordered. Radiology: ordered.  Risk Prescription drug management. Decision regarding hospitalization.           Final Clinical Impression(s) / ED  Diagnoses Final diagnoses:  Acute on chronic respiratory failure with hypoxia [facility])    Rx / DC Orders ED Discharge Orders     None         Josem Kaufmann 07/30/23 1744    Gloris Manchester, MD 07/31/23 1228

## 2023-07-30 NOTE — H&P (Signed)
History and Physical    Patient: Nathan Miller ZOX:[id number] DOB: [birth year] DOA: 07/30/2023 DOS: the patient was seen and examined on 07/30/2023 [facility]: [facility]  Patient coming from: Home  Chief Complaint:  Chief Complaint  Patient presents with   Shortness of Breath   HPI: Nathan Miller is a 72 y.o. 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  Patient reports waking up requiring up to 4 L to maintain saturation and very short winded with any activity.  He has noticed in the last week or so increase weight gain, lower extremity swelling and in the last 48 hours swollen hypersomnia. Patient expressed no shortness of breath, no nausea, no vomiting, no coughing spells and no sick contact.  He had also expressed no dysuria, no hematuria, no melena, no hematochezia, no dysuria, no hematuria, no focal weaknesses or any other complaints.  Workup in the ED demonstrating the presence of acute on chronic CHF exacerbation with vascular congestion and elevated BNP; patient also with concern for bronchitic changes and diffuse wheezing requiring high level of oxygen supplementation.  IV Lasix, steroids and bronchodilators were provided.  TRH has been consulted to place patient in the hospital for further evaluation and management.  Review of Systems: As mentioned in the history of present illness. All other systems reviewed and are negative. Past Medical History:  Diagnosis Date   CAD (coronary artery disease)    NSTEMI in 2001. Cardiac cath showed: LAD: 40% proximal, LCX: 70% mid, RCA thrombotic occlusion in mid segment. s/p PCI and 2 overlapped BMSs to RCA.    CHF (congestive heart  failure) (HCC)    COPD (chronic obstructive pulmonary disease) (HCC)    Diabetes mellitus    Hypertension    Osteomyelitis (HCC)    Presence of permanent cardiac pacemaker    Past Surgical History:  Procedure Laterality Date   AMPUTATION Right 08/03/2021   Procedure: AMPUTATION BELOW KNEE;  Surgeon: Nadara Mustard, MD;  Location: [facility] OR;  Service: Orthopedics;  Laterality: Right;   BELOW KNEE LEG AMPUTATION     BIOPSY  08/07/2021   Procedure: BIOPSY;  Surgeon: Kerin Salen, MD;  Location: [facility] ENDOSCOPY;  Service: Gastroenterology;;   BUBBLE STUDY  04/07/2021   Procedure: BUBBLE STUDY;  Surgeon: Parke Poisson, MD;  Location: [facility] ENDOSCOPY;  Service: Cardiology;;   CARDIAC CATHETERIZATION     COLONOSCOPY N/A 08/07/2021   Procedure: COLONOSCOPY;  Surgeon: Kerin Salen, MD;  Location: [facility] ENDOSCOPY;  Service: Gastroenterology;  Laterality: N/A;   CORONARY ANGIOPLASTY     ESOPHAGOGASTRODUODENOSCOPY (EGD) WITH PROPOFOL N/A 08/07/2021   Procedure: ESOPHAGOGASTRODUODENOSCOPY (EGD) WITH PROPOFOL;  Surgeon: Kerin Salen, MD;  Location: [facility] ENDOSCOPY;  Service: Gastroenterology;  Laterality: N/A;   PERMANENT PACEMAKER INSERTION N/A 01/22/2015   MDT Advisa pacemaker implanted for complete heart block by Dr Johney Frame   POLYPECTOMY  08/07/2021   Procedure: POLYPECTOMY;  Surgeon: Kerin Salen, MD;  Location: [facility] ENDOSCOPY;  Service: Gastroenterology;;   TEE WITHOUT CARDIOVERSION N/A 04/07/2021   Procedure: TRANSESOPHAGEAL ECHOCARDIOGRAM (TEE);  Surgeon: Parke Poisson, MD;  Location: [facility] ENDOSCOPY;  Service: Cardiology;  Laterality: N/A;   TEMPORARY PACEMAKER INSERTION N/A 01/21/2015   Procedure: TEMPORARY PACEMAKER INSERTION;  Surgeon: Marykay Lex,  MD;  Location: [facility] CATH LAB;  Service: Cardiovascular;  Laterality: N/A;   Social History:  reports that he quit smoking about 15 years ago. His smoking use included cigarettes. He started smoking about 54 years ago. He has a 12 pack-year smoking history.  His smokeless tobacco use includes snuff. He reports that he does not currently use alcohol after a past usage of about 42.0 standard drinks of alcohol per week. He reports that he does not use drugs.  No Known Allergies  Family History  Problem Relation Age of Onset   Heart disease Father    COPD Father    Heart disease Paternal Grandfather     Prior to Admission medications   Medication Sig Start Date End Date Taking? Authorizing Provider  acetaminophen (TYLENOL) 500 MG tablet Take 500 mg by mouth every 6 (six) hours as needed for mild pain or fever.   Yes [provider]  albuterol (VENTOLIN HFA) 108 (90 Base) MCG/ACT inhaler Inhale 2 puffs into the lungs every 6 (six) hours as needed for wheezing or shortness of breath. 07/12/21  Yes [provider]  aspirin EC 81 MG tablet Take 1 tablet (81 mg total) by mouth daily. 01/25/15  Yes Short, Thea Silversmith, MD  atorvastatin (LIPITOR) 80 MG tablet Take 1 tablet (80 mg total) by mouth daily. 05/04/17  Yes Allred, Fayrene Fearing, MD  carvedilol (COREG) 25 MG tablet Take 1 tablet (25 mg total) by mouth 2 (two) times daily. 05/04/17  Yes Allred, Fayrene Fearing, MD  ferrous sulfate 325 (65 FE) MG tablet Take 325 mg by mouth every other day.   Yes [provider]  metFORMIN (GLUCOPHAGE) 500 MG tablet Take 1 tablet (500 mg total) by mouth 2 (two) times daily with a meal. 01/25/15  Yes Short, Thea Silversmith, MD  Multiple Vitamins-Minerals (CENTRUM ADULTS) TABS Take 1 tablet by mouth daily.   Yes [provider]  nitroGLYCERIN (NITROSTAT) 0.4 MG SL tablet Place 1 tablet (0.4 mg total) under the tongue every 5 (five) minutes as needed for chest pain. 05/04/23  Yes Rollene Rotunda, MD  SPIRIVA RESPIMAT 2.5 MCG/ACT AERS Inhale 2 puffs into the lungs daily. 07/27/21  Yes [provider]  torsemide (DEMADEX) 20 MG tablet Take 20 mg by mouth daily. 07/21/21  Yes [provider]    Physical Exam: Vitals:   07/30/23 1300 07/30/23 1315  07/30/23 1517 07/30/23 1531  BP:  110/78 (!) 153/58   Pulse: (!) 50 (!) 56 (!) 51   Resp: (!) 27 13 17    Temp:   97.7 F (36.5 C)   TempSrc:   Oral   SpO2: 99% 98% 100% 100%  Weight:      Height:       General exam: Alert, awake, oriented x 3; mild difficulty speaking in full sentences, but already improved after receiving Lasix and his steroids.  Patient reports no chest pain. Respiratory system: Fine crackles at the bases, positive expiratory wheezing, no using accessory muscle.  Good saturation on 4 L supplementation. Cardiovascular system: Rate control and paced per telemetry evaluation.  No rubs, no gallops, unable to assess JVD with body habitus. Gastrointestinal system: Abdomen is obese, nontender on palpation; increased abdominal girth appreciated on exam.  Positive bowel sounds. Central nervous system: Moving 4 limbs spontaneously.  No focal neurological deficits. Extremities: No cyanosis or clubbing; 2+ edema bilaterally appreciated. Skin: No petechiae. Psychiatry: Judgement and insight appear normal. Mood & affect appropriate.   Data Reviewed: CBC: White blood cell 7.7, hemoglobin  12.5 and platelet count 274K BNP:712 Troponin: 12 >>> 11 Respiratory panel negative for COVID and influenza Basic metabolic panel: Sodium 127, potassium 4.6, chloride 82, bicarb 35, BUN 10, creatinine 0.88 and GFR >60  Assessment and Plan: Acute on chronic respiratory failure (HCC) -Factorial in the setting of COPD/bronchiectasis exacerbation and CHF exacerbation -Patient chronically on 2 L nasal cannula supplementation and in the requiring O2 4 L to maintain saturation above 90% -Patient will be admitted to telemetry, will follow daily weights, strict I's and O's, low-sodium diet and provide IV diuresis. -2D echo will be ordered to assess ejection fraction and heart structure. -For his COPD nebulizer management, steroids, flutter valve, mucolytic's and oral doxycycline will be provided -Wean  off oxygen supplementation as tolerated and follow clinical response.  Hyperlipidemia with target low density lipoprotein (LDL) cholesterol less than 100 mg/dL -Continue Lipitor -Heart healthy diet discussed with patient.  Uncontrolled type 2 diabetes mellitus with hyperglycemia, with long-term current use of insulin (HCC) -Update A1c -Sliding scale insulin and Semglee will be started -Follow CBGs fluctuation and adjust hypoglycemic regimen as needed -Anticipating elevation in his blood sugar with the use of his steroids.  Essential hypertension -Continue home antihypertensive regimen -Follow vital signs -Heart healthy/low-sodium diet discussed with patient.  Obesity -Body mass index is 42.29 kg/m. -low calorie diet, portion control and increase physical activity discussed with patient.   Hyponatremia -With concern for fluid overload triggering abnormal electrolytes -Neurologically intact -Continue IV diuresis as mentioned above -Closely follow electrolytes trend.   Advance Care Planning:   Code Status: Full Code   Consults: None  Family Communication: No family at bedside  Severity of Illness: The appropriate patient status for this patient is INPATIENT. Inpatient status is judged to be reasonable and necessary in order to provide the required intensity of service to ensure the patient's safety. The patient's presenting symptoms, physical exam findings, and initial radiographic and laboratory data in the context of their chronic comorbidities is felt to place them at high risk for further clinical deterioration. Furthermore, it is not anticipated that the patient will be medically stable for discharge from the hospital within 2 midnights of admission.   * I certify that at the point of admission it is my clinical judgment that the patient will require inpatient hospital care spanning beyond 2 midnights from the point of admission due to high intensity of service, high risk for  further deterioration and high frequency of surveillance required.*  Author: Vassie Loll, MD 07/30/2023 7:13 PM  For on call review [email or web address].

## 2023-07-30 NOTE — Assessment & Plan Note (Signed)
-  Body mass index is 42.29 kg/m. -low calorie diet, portion control and increase physical activity discussed with patient.

## 2023-07-30 NOTE — ED Triage Notes (Addendum)
Pt brought in by [facility] from home with c/o SOB since he woke at 0430 this morning. Pt normally wears 2L O2 via [place] and was at 83%, increased to 4L and O2 increased to 90%. Pt didn't want to walk to EMS stretcher with O2 so it was removed and his O2 dropped to 79%. 4L reapplied and O2 increased to 96%. Pt used his Sprivia inhaler and his rescue inhaler this morning PTA with some relief. EMS reports lung sounds are clear. BP-159/89, RR 16 for EMS. EMS unable to get a reliable HR PTA.

## 2023-07-30 NOTE — Assessment & Plan Note (Signed)
-  Update A1c -Sliding scale insulin and Semglee will be started -Follow CBGs fluctuation and adjust hypoglycemic regimen as needed -Anticipating elevation in his blood sugar with the use of his steroids.

## 2023-07-30 NOTE — Assessment & Plan Note (Signed)
-  Factorial in the setting of COPD/bronchiectasis exacerbation and CHF exacerbation -Patient chronically on 2 L nasal cannula supplementation and in the requiring O2 4 L to maintain saturation above 90% -Patient will be admitted to telemetry, will follow daily weights, strict I's and O's, low-sodium diet and provide IV diuresis. -2D echo will be ordered to assess ejection fraction and heart structure. -For his COPD nebulizer management, steroids, flutter valve, mucolytic's and oral doxycycline will be provided -Wean off oxygen supplementation as tolerated and follow clinical response.

## 2023-07-30 NOTE — Assessment & Plan Note (Signed)
-  Status post pacemaker implantation -Continue telemetry monitoring -Follow electrolytes and replete as needed (goal is for potassium above 4 and magnesium above 2).

## 2023-07-30 NOTE — Assessment & Plan Note (Signed)
-  Continue Lipitor -Heart healthy diet discussed with patient. 

## 2023-07-31 DIAGNOSIS — I442 Atrioventricular block, complete: Secondary | ICD-10-CM | POA: Diagnosis not present

## 2023-07-31 DIAGNOSIS — J9621 Acute and chronic respiratory failure with hypoxia: Secondary | ICD-10-CM | POA: Diagnosis not present

## 2023-07-31 DIAGNOSIS — E66813 Obesity, class 3: Secondary | ICD-10-CM

## 2023-07-31 DIAGNOSIS — I10 Essential (primary) hypertension: Secondary | ICD-10-CM | POA: Diagnosis not present

## 2023-07-31 DIAGNOSIS — E1165 Type 2 diabetes mellitus with hyperglycemia: Secondary | ICD-10-CM | POA: Diagnosis not present

## 2023-07-31 LAB — GLUCOSE, CAPILLARY
Glucose-Capillary: 162 mg/dL — ABNORMAL HIGH (ref 70–99)
Glucose-Capillary: 156 mg/dL — ABNORMAL HIGH (ref 70–99)
Glucose-Capillary: 199 mg/dL — ABNORMAL HIGH (ref 70–99)
Glucose-Capillary: 187 mg/dL — ABNORMAL HIGH (ref 70–99)

## 2023-07-31 LAB — BASIC METABOLIC PANEL
Anion gap: 13 (ref 5–15)
BUN: 12 mg/dL (ref 8–23)
CO2: 33 mmol/L — ABNORMAL HIGH (ref 22–32)
Calcium: 8.7 mg/dL — ABNORMAL LOW (ref 8.9–10.3)
Chloride: 81 mmol/L — ABNORMAL LOW (ref 98–111)
Creatinine, Ser: 0.86 mg/dL (ref 0.61–1.24)
GFR, Estimated: >60 mL/min (ref >60)
Glucose, Bld: 190 mg/dL — ABNORMAL HIGH (ref 70–99)
Potassium: 4 mmol/L (ref 3.5–5.1)
Sodium: 127 mmol/L — ABNORMAL LOW (ref 135–145)

## 2023-07-31 LAB — CBC
HCT: 39.7 % (ref 39.0–52.0)
Hemoglobin: 12.6 g/dL — ABNORMAL LOW (ref 13.0–17.0)
MCH: 28.3 pg (ref 26.0–34.0)
MCHC: 31.7 g/dL (ref 30.0–36.0)
MCV: 89 fL (ref 80.0–100.0)
Platelets: 290 10*3/uL (ref 150–400)
RBC: 4.46 MIL/uL (ref 4.22–5.81)
RDW: 16 % — ABNORMAL HIGH (ref 11.5–15.5)
WBC: 6.4 10*3/uL (ref 4.0–10.5)
nRBC: 0 % (ref 0.0–0.2)

## 2023-07-31 LAB — HIV ANTIBODY (ROUTINE TESTING W REFLEX): HIV Screen 4th Generation wRfx: NONREACTIVE

## 2023-07-31 MED ORDER — IPRATROPIUM-ALBUTEROL 0.5-2.5 (3) MG/3ML IN SOLN
3.0000 mL | Freq: Four times a day (QID) | RESPIRATORY_TRACT | Status: DC | PRN
  Administered 2023-08-01: 3 mL via RESPIRATORY_TRACT
  Filled 2023-07-31: qty 3

## 2023-07-31 NOTE — TOC Initial Note (Addendum)
Transition of Care [facility]) - Initial/Assessment Note    Patient Details  Name: Nathan Miller MRN: [id number] Date of Birth: 04/24/1951  Transition of Care [facility]) CM/SW Contact:    Karn Cassis, LCSW Phone Number: 07/31/2023, 8:14 AM  Clinical Narrative: Pt admitted with acute on chronic respiratory failure. TOC received consult for CHF screening. Pt reports he lives with his wife, daughter, and granddaughter. He is fairly independent with ADLs, requiring assist with dressing. Pt ambulates with a cane at baseline. He is on 2L home O2 ([employer]). Pt will need nebulizer. Wife requests [employer]. Referral made to [facility] and will deliver to hospital room. Family provides transportation to appointments. Pt plans to return home when medically stable.  CHF screening completed. Pt reports he has a scale at home, but does not weigh himself regularly. LCSW discussed importance of daily weights. He states he "tries to" follow heart healthy diet and he takes medications as prescribed. CHF education added to AVS. TOC will follow.                   Expected Discharge Plan: Home/Self Care Barriers to Discharge: Continued Medical Work up   Patient Goals and CMS Choice Patient states their goals for this hospitalization and ongoing recovery are:: return home   Choice offered to / list presented to : Patient [place] ownership interest in [facility].provided to::  (n/a)    Expected Discharge Plan and Services In-house Referral: Clinical Social Work     Living arrangements for the past 2 months: Single Family Home                                      Prior Living Arrangements/Services Living arrangements for the past 2 months: Single Family Home Lives with:: Spouse, Adult Children Patient language and need for interpreter reviewed:: Yes Do you feel safe going back to the place where you live?: Yes      Need for Family Participation in Patient Care: No (Comment)   Current  home services: DME (walker, cane, wheelchair) Criminal Activity/Legal Involvement Pertinent to Current Situation/Hospitalization: No - Comment as needed  Activities of Daily Living   ADL Screening (condition at time of admission) Does the patient have a NEW difficulty with bathing/dressing/toileting/self-feeding that is expected to last >3 days?: Yes (Initiates electronic notice to provider for possible OT consult) Does the patient have a NEW difficulty with getting in/out of bed, walking, or climbing stairs that is expected to last >3 days?: Yes (Initiates electronic notice to provider for possible PT consult) Does the patient have a NEW difficulty with communication that is expected to last >3 days?: No Is the patient deaf or have difficulty hearing?: No Does the patient have difficulty seeing, even when wearing glasses/contacts?: No Does the patient have difficulty concentrating, remembering, or making decisions?: No  Permission Sought/Granted                  Emotional Assessment     Affect (typically observed): Appropriate Orientation: : Oriented to Self, Oriented to Place, Oriented to Situation, Oriented to  Time Alcohol / Substance Use: Not Applicable Psych Involvement: No (comment)  Admission diagnosis:  Acute on chronic respiratory failure (HCC) [J96.20] Patient Active Problem List   Diagnosis Date Noted   Acute on chronic respiratory failure (HCC) 07/30/2023   Uncontrolled type 2 diabetes mellitus with hyperglycemia, with long-term current use of  insulin (HCC) 07/30/2023   Chronic diastolic HF (heart failure) (HCC) 06/05/2022   PAD (peripheral artery disease) (HCC) 06/05/2022   Diabetic foot infection (HCC) 08/01/2021   Bacteremia 04/20/2021   Acute respiratory failure with hypoxia (HCC) 03/31/2021   Obesity with body mass index (BMI) of 30.0 to 39.9 03/31/2021   Elevated d-dimer 03/31/2021   Elevated brain natriuretic peptide (BNP) level 03/31/2021   Cardiac  pacemaker in situ 07/18/2019   Umbilical hernia 01/29/2016   Obesity 01/29/2016   Marijuana use 01/29/2016   Diabetes type 2, controlled (HCC) 01/29/2016   Diverticulitis of intestine with perforation without bleeding    Blood poisoning    Diverticulitis of intestine with perforation 06/13/2015   Diverticulitis 06/13/2015   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   Faintness    Bradycardia 01/21/2015   Syncope 01/21/2015   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   Right foot ulcer (HCC) 01/21/2015   Diabetes mellitus type 2, uncontrolled 01/21/2015   Hyperlipidemia with target low density lipoprotein (LDL) cholesterol less than 100 mg/dL 16/07/9603   Tobacco use disorder 02/11/2013   Anxiety state, unspecified 02/11/2013   Essential hypertension 05/10/2011   Papule 03/20/2011   Subacute osteomyelitis, right ankle and foot (HCC)    CAD S/P percutaneous coronary angioplasty; bms X 2 to RCA 2001 01/22/2000    Class: Diagnosis of   PCP:  [facility] Pharmacy:   [facility] [address], [place] - [address] [place] [address] Phone: [phone] Fax: [phone]     Social Determinants of Health (SDOH) Social History: SDOH Screenings   Food Insecurity: No Food Insecurity (07/30/2023)  Housing: Low Risk  (07/30/2023)  Transportation Needs: No Transportation Needs (07/30/2023)  Utilities: Not At Risk (07/30/2023)  Financial Resource Strain: Low Risk  (06/28/2021)   Received from [facility]  Physical Activity: Inactive (07/13/2021)   Received from [facility]  Social Connections: Moderately Integrated (07/13/2021)   Received from [facility], [facility]  Stress: No Stress Concern Present (07/13/2021)   Received from [facility], [facility]  Tobacco Use: High Risk (07/30/2023)  Health Literacy: Low Risk  (07/13/2021)   Received from [facility], [facility]   SDOH Interventions:      Readmission Risk Interventions     No data to display

## 2023-07-31 NOTE — Progress Notes (Signed)
Progress Note   Patient: Nathan Miller ZOX:[id number] DOB: [birth year] DOA: 07/30/2023     1 DOS: the patient was seen and examined on 07/31/2023   Brief hospital admission narrative: Nathan Miller is a 72 y.o. 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  Patient reports waking up requiring up to 4 L to maintain saturation and very short winded with any activity.  He has noticed in the last week or so increase weight gain, lower extremity swelling and in the last 48 hours swollen hypersomnia. Patient expressed no shortness of breath, no nausea, no vomiting, no coughing spells and no sick contact.  He had also expressed no dysuria, no hematuria, no melena, no hematochezia, no dysuria, no hematuria, no focal weaknesses or any other complaints.   Workup in the ED demonstrating the presence of acute on chronic CHF exacerbation with vascular congestion and elevated BNP; patient also with concern for bronchitic changes and diffuse wheezing requiring high level of oxygen supplementation.  IV Lasix, steroids and bronchodilators were provided.  TRH has been consulted to place patient in the hospital for further evaluation and management.  Assessment and Plan: Acute on chronic respiratory failure with hypoxia -Multifactorial: In the setting of COPD/bronchiectasis exacerbation and acute on chronic diastolic heart failure -Continue nebulizer management, as needed bronchodilators, mucolytic's, steroids and wean off oxygen supplementation as tolerated (currently using 3 L nasal cannula). -From CHF standpoint continue low-sodium diet, daily weights, strict I's and O's, IV diuresis and follow REDs clip measurements. -Will  prescribe nebulizer machine at time of discharge for DME purposes. -Hopefully discharge home in the next 24-48 hours. -Patient uses 2 L nasal, supplementation at baseline.  Hyperlipidemia -Continue Lipitor.  Type 2 diabetes with hyperglycemia -A1c 5.8 -Continue sliding scale insulin -Continue holding oral hypoglycemic agents -Follow CBGs fluctuation and adjust hypoglycemic regimen as required.  Class III obesity -Body mass index is 42.29 kg/m. -Low-calorie diet, portion control and increase physical activity discussed with patient.  Essential hypertension -Continue home antihypertensive regimen -Heart healthy/low-sodium diet discussed with patient -Continue to follow vital signs.  Hyponatremia: -127 (corrected for blood sugar 129). -Continue diuresis and follow electrolytes trend.  History of PAD -Continue risk factor modification -Status post right BKA -Patient will continue the use of prosthesis to facilitate ambulation.  Subjective:  Afebrile, no chest pain, no nausea vomiting.  Still expressing mild orthopnea and short winded sensation with activity.  Reports increased urine output and overall breathing easier.  Physical Exam: Vitals:   07/31/23 0804 07/31/23 0807 07/31/23 0812 07/31/23 1156  BP:    (!) 155/79  Pulse:    (!) 57  Resp:    19  Temp:    98.3 F (36.8 C)  TempSrc:    Oral  SpO2: 93% 97% 100% 100%  Weight:      Height:       General exam: Alert, awake, oriented x 3; no overnight events.  Afebrile.  No chest pain. Respiratory system: Improved air movement bilaterally; decreased breath sounds at the bases.  Mild expiratory wheezing on auscultation. Cardiovascular system: Rate controlled, no rubs, no gallops, unable to properly assess JVD with body habitus. Gastrointestinal system: Abdomen is obese, no guarding and no tenderness on palpation.  Ventral hernia around  umbilical area appreciated (not incarcerated).  Positive bowel sounds appreciated.   Increased abdominal girth on exam appreciated.  Central nervous system: No focal neurological deficits. Extremities: No cyanosis or clubbing; right BKA.  Left lower extremity demonstrating 2+ edema up to his thighs Skin: No petechiae. Psychiatry: Judgement and insight appear normal. Mood & affect appropriate.   Data Reviewed: CBC: WBC 6.4, hemoglobin 12.6 and platelet count 2 90K Basic metabolic panel: Sodium 127, potassium 4.0, chloride 81, bicarb 33, glucose 190, BUN 12, creatinine 0.86 and GFR >60 A1c: 5.8 TSH: 3.517  Family Communication: Wife at bedside.  Disposition: Status is: Inpatient Remains inpatient appropriate because: Continue IV steroids and IV diuresis.   Planned Discharge Destination: Home  Time spent: 50 minutes  Author: Vassie Loll, MD 07/31/2023 12:44 PM  For on call review [email or web address].

## 2023-08-01 ENCOUNTER — Other Ambulatory Visit: Payer: Self-pay

## 2023-08-01 ENCOUNTER — Encounter (HOSPITAL_COMMUNITY): Payer: Self-pay | Admitting: Internal Medicine

## 2023-08-01 DIAGNOSIS — J441 Chronic obstructive pulmonary disease with (acute) exacerbation: Secondary | ICD-10-CM | POA: Insufficient documentation

## 2023-08-01 DIAGNOSIS — I442 Atrioventricular block, complete: Secondary | ICD-10-CM | POA: Diagnosis not present

## 2023-08-01 DIAGNOSIS — I5033 Acute on chronic diastolic (congestive) heart failure: Secondary | ICD-10-CM | POA: Diagnosis not present

## 2023-08-01 DIAGNOSIS — J9621 Acute and chronic respiratory failure with hypoxia: Secondary | ICD-10-CM | POA: Diagnosis not present

## 2023-08-01 LAB — BASIC METABOLIC PANEL
Anion gap: 6 (ref 5–15)
BUN: 15 mg/dL (ref 8–23)
CO2: 39 mmol/L — ABNORMAL HIGH (ref 22–32)
Calcium: 8.5 mg/dL — ABNORMAL LOW (ref 8.9–10.3)
Chloride: 84 mmol/L — ABNORMAL LOW (ref 98–111)
Creatinine, Ser: 0.8 mg/dL (ref 0.61–1.24)
GFR, Estimated: >60 mL/min (ref >60)
Glucose, Bld: 153 mg/dL — ABNORMAL HIGH (ref 70–99)
Potassium: 3.7 mmol/L (ref 3.5–5.1)
Sodium: 129 mmol/L — ABNORMAL LOW (ref 135–145)

## 2023-08-01 LAB — GLUCOSE, CAPILLARY
Glucose-Capillary: 144 mg/dL — ABNORMAL HIGH (ref 70–99)
Glucose-Capillary: 170 mg/dL — ABNORMAL HIGH (ref 70–99)
Glucose-Capillary: 129 mg/dL — ABNORMAL HIGH (ref 70–99)
Glucose-Capillary: 258 mg/dL — ABNORMAL HIGH (ref 70–99)

## 2023-08-01 MED ORDER — PREDNISONE 20 MG PO TABS
50.0000 mg | ORAL_TABLET | Freq: Every day | ORAL | Status: DC
  Administered 2023-08-02: 50 mg via ORAL
  Filled 2023-08-01 (×2): qty 1

## 2023-08-01 NOTE — Consult Note (Addendum)
Cardiology Consultation   Patient ID: Nathan Miller MRN: [id number]; DOB: Sep 09, 1951  Admit date: 07/30/2023 Date of Consult: 08/01/2023  PCP:  [facility]   [facility] Providers Cardiologist:  Rollene Rotunda, MD  Electrophysiologist:  Maurice Small, MD       Patient Profile:   Nathan Miller is a 72 y.o. male with a hx of CAD (s/p NSTEMI in 2001 with BMSx2 to RCA), chronic HFpEF, COPD, Type II DM, CHB (s/p Medtronic PPM placement), HTN, HLD and PAD (s/p right BKA) who is being seen 08/01/2023 for the evaluation of acute CHF exacerbation at the request of Dr. Arbutus Leas.  History of Present Illness:   Nathan Miller presented to [name] ED on 07/30/2023 for evaluation of worsening shortness of breath and hypoxia as saturations were in the 80's on his usual 2 L nasal cannula. He reports gradually worsening shortness of breath over the past few days leading up to admission and had noticed worsening swelling along his left lower extremity. Says that he was not taking a diuretic routinely as he feels like he was previously allergic to this and developed hypotension with it. Says his baseline weight is approximately 240 lbs but it has been over a year since he weighed this. Also reports frequent urination with this in the past. Says that he does not limit salt intake and adds salt to most foods. He also consumes fast food and takeout several days a week and says he was having a sausage biscuit almost every other day prior to admission. He denies any recent chest pain or palpitations. Says that his activity is limited due to the use of chronic oxygen and his prosthesis. Says he mostly sits in a chair throughout the day but does try to ambulate around his home.  Initial labs showed WBC 7.7, Hgb 12.5, platelets 274, Na+ 127, K+ 4.6 and creatinine 0.88.  BNP elevated to 712.  Initial and repeat Hs Troponin values negative at 11 and 12. Negative for COVID. CXR showed  increased patchy opacities within the medial lung base bilaterally which was likely due to atelectasis. Left lower extremity doppler negative for DVT. EKG showed V-paced rhythm, HR 60.  He was admitted for further management of acute on chronic hypoxic respiratory failure and was started on antibiotics and steroids for a COPD exacerbation. In regards to his CHF exacerbation, he received 20 mg IV Lasix while in the ED and has been started on 40 mg twice daily. I&O's not fully recorded. Weight listed as 270 lbs on admission and at 268 lbs today. Repeat echocardiogram was obtained which shows a preserved EF of 55% with no regional wall motion normalities. He has noted to have mild LVH, grade 1 diastolic dysfunction, normal RV function and trivial MR.  Past Medical History:  Diagnosis Date   (HFpEF) heart failure with preserved ejection fraction (HCC)    CAD (coronary artery disease)    NSTEMI in 2001. Cardiac cath showed: LAD: 40% proximal, LCX: 70% mid, RCA thrombotic occlusion in mid segment. s/p PCI and 2 overlapped BMSs to RCA.    Complete heart block (HCC)    COPD (chronic obstructive pulmonary disease) (HCC)    Diabetes mellitus    Hypertension    Osteomyelitis (HCC)    Presence of permanent cardiac pacemaker     Past Surgical History:  Procedure Laterality Date   AMPUTATION Right 08/03/2021   Procedure: AMPUTATION BELOW KNEE;  Surgeon: Nadara Mustard, MD;  Location: [facility] OR;  Service: Orthopedics;  Laterality: Right;   BELOW KNEE LEG AMPUTATION     BIOPSY  08/07/2021   Procedure: BIOPSY;  Surgeon: Kerin Salen, MD;  Location: [facility] ENDOSCOPY;  Service: Gastroenterology;;   BUBBLE STUDY  04/07/2021   Procedure: BUBBLE STUDY;  Surgeon: Parke Poisson, MD;  Location: [facility] ENDOSCOPY;  Service: Cardiology;;   CARDIAC CATHETERIZATION     COLONOSCOPY N/A 08/07/2021   Procedure: COLONOSCOPY;  Surgeon: Kerin Salen, MD;  Location: [facility] ENDOSCOPY;  Service: Gastroenterology;  Laterality: N/A;    CORONARY ANGIOPLASTY     ESOPHAGOGASTRODUODENOSCOPY (EGD) WITH PROPOFOL N/A 08/07/2021   Procedure: ESOPHAGOGASTRODUODENOSCOPY (EGD) WITH PROPOFOL;  Surgeon: Kerin Salen, MD;  Location: [facility] ENDOSCOPY;  Service: Gastroenterology;  Laterality: N/A;   PERMANENT PACEMAKER INSERTION N/A 01/22/2015   MDT Advisa pacemaker implanted for complete heart block by Dr Nathan Miller   POLYPECTOMY  08/07/2021   Procedure: POLYPECTOMY;  Surgeon: Kerin Salen, MD;  Location: [facility] ENDOSCOPY;  Service: Gastroenterology;;   TEE WITHOUT CARDIOVERSION N/A 04/07/2021   Procedure: TRANSESOPHAGEAL ECHOCARDIOGRAM (TEE);  Surgeon: Parke Poisson, MD;  Location: [facility] ENDOSCOPY;  Service: Cardiology;  Laterality: N/A;   TEMPORARY PACEMAKER INSERTION N/A 01/21/2015   Procedure: TEMPORARY PACEMAKER INSERTION;  Surgeon: Marykay Lex, MD;  Location: [facility] CATH LAB;  Service: Cardiovascular;  Laterality: N/A;     Home Medications:  Prior to Admission medications   Medication Sig Start Date End Date Taking? Authorizing Provider  acetaminophen (TYLENOL) 500 MG tablet Take 500 mg by mouth every 6 (six) hours as needed for mild pain or fever.   Yes [provider]  albuterol (VENTOLIN HFA) 108 (90 Base) MCG/ACT inhaler Inhale 2 puffs into the lungs every 6 (six) hours as needed for wheezing or shortness of breath. 07/12/21  Yes [provider]  aspirin EC 81 MG tablet Take 1 tablet (81 mg total) by mouth daily. 01/25/15  Yes Short, Thea Silversmith, MD  atorvastatin (LIPITOR) 80 MG tablet Take 1 tablet (80 mg total) by mouth daily. 05/04/17  Yes Allred, Fayrene Fearing, MD  carvedilol (COREG) 25 MG tablet Take 1 tablet (25 mg total) by mouth 2 (two) times daily. 05/04/17  Yes Allred, Fayrene Fearing, MD  ferrous sulfate 325 (65 FE) MG tablet Take 325 mg by mouth every other day.   Yes [provider]  metFORMIN (GLUCOPHAGE) 500 MG tablet Take 1 tablet (500 mg total) by mouth 2 (two) times daily with a meal. 01/25/15  Yes Short, Thea Silversmith, MD   Multiple Vitamins-Minerals (CENTRUM ADULTS) TABS Take 1 tablet by mouth daily.   Yes [provider]  nitroGLYCERIN (NITROSTAT) 0.4 MG SL tablet Place 1 tablet (0.4 mg total) under the tongue every 5 (five) minutes as needed for chest pain. 05/04/23  Yes Rollene Rotunda, MD  SPIRIVA RESPIMAT 2.5 MCG/ACT AERS Inhale 2 puffs into the lungs daily. 07/27/21  Yes [provider]  torsemide (DEMADEX) 20 MG tablet Take 20 mg by mouth daily. 07/21/21  Yes [provider]    Inpatient Medications: Scheduled Meds:  arformoterol  15 mcg Nebulization BID   aspirin EC  81 mg Oral Daily   atorvastatin  80 mg Oral Daily   budesonide (PULMICORT) nebulizer solution  0.5 mg Nebulization BID   carvedilol  25 mg Oral BID WC   dextromethorphan-guaiFENesin  1 tablet Oral BID   doxycycline  100 mg Oral Q12H   enoxaparin (LOVENOX) injection  60 mg Subcutaneous Q24H   furosemide  40 mg Intravenous Q12H  insulin aspart  0-15 Units Subcutaneous TID WC   insulin aspart  0-5 Units Subcutaneous QHS   insulin glargine-yfgn  7 Units Subcutaneous QHS   methylPREDNISolone (SOLU-MEDROL) injection  40 mg Intravenous Q12H   sodium chloride flush  3 mL Intravenous Q12H   Continuous Infusions:  sodium chloride     PRN Meds: sodium chloride, acetaminophen **OR** acetaminophen, ipratropium-albuterol, ondansetron **OR** ondansetron (ZOFRAN) IV, sodium chloride flush  Allergies:   No Known Allergies  Social History:   Social History   Tobacco Use   Smoking status: Former    Current packs/day: 0.00    Average packs/day: 0.3 packs/day for 40.0 years (12.0 ttl pk-yrs)    Types: Cigarettes    Start date: 10/16/1968    Quit date: 10/17/2007    Years since quitting: 15.8   Smokeless tobacco: Current    Types: Snuff  Substance Use Topics   Alcohol use: Not Currently    Alcohol/week: 42.0 standard drinks of alcohol    Types: 42 Cans of beer per week    Comment: beer      Family History:     Family History  Problem Relation Age of Onset   Heart disease Father    COPD Father    Heart disease Paternal Grandfather      ROS:  Please see the history of present illness.  All other ROS reviewed and negative.     Physical Exam/Data:   Vitals:   08/01/23 0451 08/01/23 0904 08/01/23 0907 08/01/23 0910  BP: 118/64     Pulse: (!) 50     Resp:      Temp: 98.2 F (36.8 C)     TempSrc: Oral     SpO2: 94% 95% 99% 100%  Weight: 121.6 kg     Height:        Intake/Output Summary (Last 24 hours) at 08/01/2023 1058 Last data filed at 07/31/2023 1750 Gross per 24 hour  Intake 720 ml  Output --  Net 720 ml      08/01/2023    4:51 AM 07/30/2023   10:26 AM 05/04/2023    8:47 AM  Last 3 Weights  Weight (lbs) 268 lb 1.3 oz 270 lb 270 lb  Weight (kg) 121.6 kg 122.471 kg 122.471 kg     Body mass index is 41.99 kg/m.  General:  Well nourished, well developed male appearing in no acute distress HEENT: normal Neck: no JVD Vascular: No carotid bruits; Distal pulses 2+ bilaterally Cardiac:  normal S1, S2; RRR; no murmur  Lungs:  clear to auscultation bilaterally, no wheezing, rhonchi or rales  Abd: soft, nontender, no hepatomegaly  Ext: 2+ pitting edema along LLE. Right BKA.  Skin: warm and dry  Neuro:  CNs 2-12 intact, no focal abnormalities noted Psych:  Normal affect   EKG:  The EKG was personally reviewed and demonstrates: V-paced rhythm, HR 60. Telemetry:  Telemetry was personally reviewed and demonstrates:  V-paced HR in 50's to 60's.   Relevant CV Studies:  Echocardiogram: 07/30/2023 IMPRESSIONS    1. Left ventricular ejection fraction, by estimation, is approximately  55%. The left ventricle has normal function. The left ventricle has no  regional wall motion abnormalities. There is mild left ventricular  hypertrophy. Left ventricular diastolic  parameters are consistent with Grade I diastolic dysfunction (impaired  relaxation).   2. Right ventricular systolic  function is normal. The right ventricular  size is normal. Tricuspid regurgitation signal is inadequate for assessing  PA pressure.  3. Left atrial size was mildly dilated.   4. Right atrial size was mildly dilated.   5. The mitral valve is grossly normal. Trivial mitral valve  regurgitation.   6. The aortic valve is tricuspid. Aortic valve regurgitation is not  visualized.   7. The inferior vena cava is normal in size with <50% respiratory  variability, suggesting right atrial pressure of 8 mmHg.   8. No obvious valvular vegetations.   Comparison(s): Prior images reviewed side by side. LVEF normal at  approximately 55%.   Laboratory Data:  High Sensitivity Troponin:   Recent Labs  Lab 07/30/23 1110 07/30/23 1354  TROPONINIHS 12 11     Chemistry Recent Labs  Lab 07/30/23 1110 07/31/23 0418 08/01/23 0459  NA 127* 127* 129*  K 4.6 4.0 3.7  CL 82* 81* 84*  CO2 35* 33* 39*  GLUCOSE 114* 190* 153*  BUN 10 12 15   CREATININE 0.88 0.86 0.80  CALCIUM 8.7* 8.7* 8.5*  GFRNONAA >60 >60 >60  ANIONGAP 10 13 6     Hematology Recent Labs  Lab 07/30/23 1110 07/31/23 0418  WBC 7.7 6.4  RBC 4.40 4.46  HGB 12.5* 12.6*  HCT 39.6 39.7  MCV 90.0 89.0  MCH 28.4 28.3  MCHC 31.6 31.7  RDW 16.3* 16.0*  PLT 274 290   Thyroid  Recent Labs  Lab 07/30/23 1110  TSH 3.517    BNP Recent Labs  Lab 07/30/23 1110  BNP 712.0*    Radiology/Studies:  [place] EKG SITE RITE  Result Date: 08/01/2023 If Site Rite image not attached, placement could not be confirmed due to current cardiac rhythm.  DG Tibia/Fibula Left  Result Date: 07/30/2023 CLINICAL DATA:  Shortness of breath Left leg pain status post fall EXAM: LEFT TIBIA AND FIBULA - 2 VIEW COMPARISON:  None available FINDINGS: Diffuse subcutaneous edema. Circumferential ankle soft tissue swelling. Atherosclerotic changes seen throughout visualized arterial segments. No fracture or dislocation. Advanced degenerative changes of the left  knee. IMPRESSION: No fracture or dislocation of the left tibia or fibula. Electronically Signed   By: Acquanetta Belling M.D.   On: 07/30/2023 12:29   DG Chest Port 1 View  Result Date: 07/30/2023 CLINICAL DATA:  pain left leg, fall EXAM: PORTABLE CHEST - 1 VIEW COMPARISON:  04/08/2021 FINDINGS: Mild cardiomegaly.  No pulmonary vascular congestion. Increased opacity in the medial lung bases bilaterally is likely due to atelectasis. Pneumonia considered less likely. Single lead left chest wall pacemaker is unchanged in configuration. IMPRESSION: Increased patchy opacities within the medial lung bases bilaterally is likely due to atelectasis. Pneumonia considered less likely. Electronically Signed   By: Acquanetta Belling M.D.   On: 07/30/2023 12:27   US Venous Img Lower  Left (DVT Study)  Result Date: 07/30/2023 CLINICAL DATA:  Chronic left leg edema.  Pain status post fall EXAM: LEFT LOWER EXTREMITY VENOUS DOPPLER ULTRASOUND TECHNIQUE: Gray-scale sonography with compression, as well as color and duplex ultrasound, were performed to evaluate the deep venous system(s) from the level of the common femoral vein through the popliteal and proximal calf veins. COMPARISON:  None available FINDINGS: VENOUS Normal compressibility of the common femoral, superficial femoral, and popliteal veins, as well as the visualized calf veins. Visualized portions of profunda femoral vein and great saphenous vein unremarkable. No filling defects to suggest DVT on grayscale or color Doppler imaging. Doppler waveforms show normal direction of venous flow, normal respiratory plasticity and response to augmentation. Limited views of the contralateral common femoral vein are unremarkable.  OTHER None. Limitations: none IMPRESSION: No DVT of the left lower extremity. Electronically Signed   By: Acquanetta Belling M.D.   On: 07/30/2023 12:24    Assessment and Plan:   1. Acute HFpEF - Presented with acute hypoxic respiratory failure which is likely  multifactorial in the setting of his CHF and COPD exacerbation. BNP was elevated to 712. Repeat echo shows a preserved EF with grade 1 diastolic dysfunction and normal RV function. Suspect his exacerbation is secondary to dietary noncompliance and medication noncompliance as discussed above. - He reports urinating multiple times since being on IV Lasix and feels that his respiratory status is at baseline. He is very anxious to go home today. He does have pitting edema on examination today and this appears chronic with a possible component of lymphedema as well. Will ask for a ReDS vest reading to be obtained. If this is elevated, would continue with IV Lasix as his renal function remains stable with creatinine at 0.80. If within normal limits, could perhaps switch to Torsemide 20 mg daily as previously prescribed since he was not taking this at home. Can review the possible addition of an SGLT2 inhibitor as an outpatient but would gradually add medications given prior noncompliance.  2. CAD  - He is s/p NSTEMI in 2001 with BMSx2 to RCA. He has baseline dyspnea on exertion but denies any recent chest pain. Troponin values have been negative this admission. Echo also shows a preserved EF with no regional wall motion abnormalities. - Continue ASA 81 mg daily, Atorvastatin 80 mg daily and Coreg 25 mg twice daily.  3. CHB  - He is s/p Medtronic PPM placement which is followed by Dr. Nelly Laurence. Device interrogation in 05/2023 showed normal device function.  4.  HTN - BP is well-controlled, at 118/64 on most recent check. He has been continued on Coreg 25 mg twice daily.  5. HLD - LDL was at 38 in 05/2023. Continue current medical therapy with Atorvastatin 80 mg daily.    6. COPD Exacerbation - Remains on antibiotics, nebulizers and steroids. Management per the admitting team.     Risk Assessment/Risk Scores:    New York Heart Association (NYHA) Functional Class NYHA Class III   For questions or  updates, please contact [facility] Please consult [email or web address] for contact info under    Signed, Ellsworth Lennox, PA-C  08/01/2023 10:58 AM   Attending note:  Patient seen and examined.  I reviewed his records and discussed the case with Ms. Patrick Jupiter, I agree with her above findings.  Mr. Musgraves has a history of CAD status post BMS x 2 to the RCA in 2001, complete heart block with Medtronic pacemaker in place, and chronic HFpEF.  Currently admitted with worsening shortness of breath and hypoxic respiratory failure despite supplemental oxygen use, fluid overloaded in the setting of increased dietary sodium intake and not using diuretics consistently.  Echocardiogram from September 30 shows LVEF 55% with mild diastolic dysfunction and normal RV contraction.  On examination today he appears comfortable, eating lunch, requesting to go home.  States that his breathing has improved.  He is afebrile, heart rates in the 50s to 60s in a ventricular paced rhythm by telemetry, blood pressure 118/64.  Lungs are clear, 2+ edema noted of the right lower leg and status post right BKA.  Pertinent lab work includes potassium 3.7, sodium 129, creatinine 0.8, high-sensitivity troponin I 11, hemoglobin 12.6, platelets 290.  ECG shows a ventricular paced rhythm.  Chest x-ray reports atelectasis involving the medial lung bases bilaterally, less likely infiltrate.  HFpEF with fluid retention.  Currently on Lasix 40 mg IV twice daily, diuresing but intake and output are incomplete.  He feels better symptomatically, recommending a ReDS vest measurement today.  Can use this to help determine whether he needs another day of IV diuretics versus going home on Demadex 20 mg daily, he has a follow-up already scheduled with Dr. Antoine Poche in 1 week.  Also on aspirin, Lipitor, and Coreg.  Jonelle Sidle, M.D., F.A.C.C.

## 2023-08-01 NOTE — Plan of Care (Signed)
  Problem: Acute Rehab PT Goals(only PT should resolve) Goal: Pt Will Go Supine/Side To Sit Outcome: Progressing Flowsheets (Taken 08/01/2023 1211) Pt will go Supine/Side to Sit: Independently Goal: Patient Will Transfer Sit To/From Stand Outcome: Progressing Flowsheets (Taken 08/01/2023 1211) Patient will transfer sit to/from stand: with modified independence Goal: Pt Will Transfer Bed To Chair/Chair To Bed Outcome: Progressing Flowsheets (Taken 08/01/2023 1211) Pt will Transfer Bed to Chair/Chair to Bed: with modified independence Goal: Pt Will Ambulate Outcome: Progressing Flowsheets (Taken 08/01/2023 1211) Pt will Ambulate:  75 feet  with modified independence  with rolling walker   12:11 PM, 08/01/23 Ocie Bob, MPT Physical Therapist with [facility] [phone] office [phone] mobile phone

## 2023-08-01 NOTE — Care Management Important Message (Signed)
Important Message  Patient Details  Name: Nathan Miller MRN: [id number] Date of Birth: 1951-06-05   Important Message Given:  N/A - LOS <3 / Initial given by admissions     Corey Harold 08/01/2023, 10:17 AM

## 2023-08-01 NOTE — Hospital Course (Signed)
72 y.o. 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  Patient reports waking up requiring up to 4 L to maintain saturation and very short winded with any activity.  He has noticed in the last week or so increase weight gain, lower extremity swelling and in the last 48 hours swollen hypersomnia. Patient expressed no shortness of breath, no nausea, no vomiting, no coughing spells and no sick contact.  He had also expressed no dysuria, no hematuria, no melena, no hematochezia, no dysuria, no hematuria, no focal weaknesses or any other complaints.   Workup in the ED demonstrating the presence of acute on chronic CHF exacerbation with vascular congestion and elevated BNP; patient also with concern for bronchitic changes and diffuse wheezing requiring high level of oxygen supplementation.  IV Lasix, steroids and bronchodilators were provided.  TRH has been consulted to place patient in the hospital for further evaluation and management.

## 2023-08-01 NOTE — Evaluation (Signed)
Physical Therapy Evaluation Patient Details Name: Nathan Miller MRN: [id number] DOB: 05/07/1951 Today's Date: 08/01/2023  History of Present Illness  Nathan Miller is a 72 y.o. 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  Patient reports waking up requiring up to 4 L to maintain saturation and very short winded with any activity.  He has noticed in the last week or so increase weight gain, lower extremity swelling and in the last 48 hours swollen hypersomnia.  Patient expressed no shortness of breath, no nausea, no vomiting, no coughing spells and no sick contact.  He had also expressed no dysuria, no hematuria, no melena, no hematochezia, no dysuria, no hematuria, no focal weaknesses or any other complaints.   Clinical Impression  Patient functioning near baseline for functional mobility and gait demonstrating slightly labored cadence during ambulation in room/hallway without loss of balance and limited mostly due to fatigue.  Patient on 2 LPM during gait training with SpO2 between 90-93% and tolerated sitting up at bedside after therapy.  Patient will benefit from continued skilled physical therapy in hospital and recommended venue below to increase strength, balance, endurance for safe ADLs and gait.          If plan is discharge home, recommend the following: A little help with walking and/or transfers;A little help with bathing/dressing/bathroom;Assistance with cooking/housework;Help with stairs or ramp for entrance   Can travel by private vehicle        Equipment Recommendations None recommended by PT  Recommendations for Other Services       Functional Status Assessment Patient has had a recent  decline in their functional status and demonstrates the ability to make significant improvements in function in a reasonable and predictable amount of time.     Precautions / Restrictions Precautions Precautions: Fall Restrictions Weight Bearing Restrictions: No      Mobility  Bed Mobility Overal bed mobility: Modified Independent                  Transfers Overall transfer level: Modified independent Equipment used: Rolling walker (2 wheels)                    Ambulation/Gait Ambulation/Gait assistance: Supervision, Contact guard assist Gait Distance (Feet): 60 Feet Assistive device: Rolling walker (2 wheels) Gait Pattern/deviations: Decreased step length - right, Decreased step length - left, Decreased stride length, Trunk flexed Gait velocity: decreased     General Gait Details: slightly labored cadence without loss of balance, limited mostly due to fatigue, on 2 LPM with SpO2 at 90-93%  Stairs            Wheelchair Mobility     Tilt Bed    Modified Rankin (Stroke Patients Only)       Balance Overall balance assessment: Needs assistance Sitting-balance support: Feet supported, No upper extremity supported Sitting balance-Leahy Scale: Good Sitting balance - Comments: seated at EOB   Standing balance support: Reliant on assistive device for balance, During functional activity, Bilateral upper extremity supported Standing balance-Leahy Scale: Fair Standing balance comment: fair/good using RW                             Pertinent  Vitals/Pain Pain Assessment Pain Assessment: No/denies pain    Home Living Family/patient expects to be discharged to:: Private residence Living Arrangements: Spouse/significant other;Children Available Help at Discharge: Family;Available 24 hours/day Type of Home: Mobile home Home Access: Stairs to enter Entrance Stairs-Rails: Right;Left Entrance Stairs-Number of Steps: 5   Home Layout: One  level Home Equipment: Agricultural consultant (2 wheels);Cane - single point      Prior Function Prior Level of Function : Needs assist;Driving       Physical Assist : Mobility (physical);ADLs (physical) Mobility (physical): Bed mobility;Transfers;Gait;Stairs   Mobility Comments: household and short distanced community ambulator using RW or SPC ADLs Comments: Assisted by family     Extremity/Trunk Assessment   Upper Extremity Assessment Upper Extremity Assessment: Overall WFL for tasks assessed    Lower Extremity Assessment Lower Extremity Assessment: Generalized weakness    Cervical / Trunk Assessment Cervical / Trunk Assessment: Kyphotic  Communication   Communication Communication: No apparent difficulties  Cognition Arousal: Alert Behavior During Therapy: WFL for tasks assessed/performed Overall Cognitive Status: Within Functional Limits for tasks assessed                                          General Comments      Exercises     Assessment/Plan    PT Assessment Patient needs continued PT services  PT Problem List Decreased strength;Decreased activity tolerance;Decreased balance;Decreased mobility       PT Treatment Interventions DME instruction;Gait training;Stair training;Functional mobility training;Therapeutic activities;Therapeutic exercise;Balance training;Patient/family education    PT Goals (Current goals can be found in the Care Plan section)  Acute Rehab PT Goals Patient Stated Goal: return home with family to assist PT Goal Formulation: With patient Time For Goal Achievement: 08/06/23 Potential to Achieve Goals: Good    Frequency Min 2X/week     Co-evaluation               AM-PAC PT "6 Clicks" Mobility  Outcome Measure Help needed turning from your back to your side while in a flat bed without using bedrails?: None Help needed moving from lying on your back to sitting on the side of a flat bed without using bedrails?:  None Help needed moving to and from a bed to a chair (including a wheelchair)?: None Help needed standing up from a chair using your arms (e.g., wheelchair or bedside chair)?: None Help needed to walk in hospital room?: A Little Help needed climbing 3-5 steps with a railing? : A Little 6 Click Score: 22    End of Session Equipment Utilized During Treatment: Oxygen Activity Tolerance: Patient tolerated treatment well;Patient limited by fatigue Patient left: in bed;with call bell/phone within reach Nurse Communication: Mobility status PT Visit Diagnosis: Unsteadiness on feet (R26.81);Other abnormalities of gait and mobility (R26.89);Muscle weakness (generalized) (M62.81)    Time: 6962-9528 PT Time Calculation (min) (ACUTE ONLY): 23 min   Charges:   PT Evaluation $PT Eval Moderate Complexity: 1 Mod PT Treatments $Therapeutic Activity: 23-37 mins PT General Charges $$ ACUTE PT VISIT: 1 Visit         12:10 PM, 08/01/23 Ocie Bob, MPT Physical Therapist with [facility] [phone] office [phone] mobile phone

## 2023-08-01 NOTE — Progress Notes (Signed)
Mobility Specialist Progress Note:    08/01/23 1340  Mobility  Activity Ambulated with assistance in hallway  Level of Assistance Standby assist, set-up cues, supervision of patient - no hands on  Assistive Device Front wheel walker  Distance Ambulated (ft) 80 ft  Range of Motion/Exercises Active;All extremities  Activity Response Tolerated well  Mobility Referral Yes  $Mobility charge 1 Mobility  Mobility Specialist Start Time (ACUTE ONLY) 1340  Mobility Specialist Stop Time (ACUTE ONLY) 1350  Mobility Specialist Time Calculation (min) (ACUTE ONLY) 10 min   Pt received sitting EOB, agreeable to mobility. Required SBA to stand and ambulate with RW. Tolerated well, asx throughout. Returned pt to room with wife, left sitting EOB. All needs met.   Lawerance Bach Mobility Specialist Please contact via Special educational needs teacher or  Rehab office at [phone]

## 2023-08-01 NOTE — Progress Notes (Signed)
PROGRESS NOTE  [name]:[id number] DOB: 1950-12-30 DOA: 07/30/2023 PCP: [facility]  Brief History:  72 y.o. 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  Patient reports waking up requiring up to 4 L to maintain saturation and very short winded with any activity.  He has noticed in the last week or so increase weight gain, lower extremity swelling and in the last 48 hours swollen hypersomnia. Patient expressed no shortness of breath, no nausea, no vomiting, no coughing spells and no sick contact.  He had also expressed no dysuria, no hematuria, no melena, no hematochezia, no dysuria, no hematuria, no focal weaknesses or any other complaints.   Workup in the ED demonstrating the presence of acute on chronic CHF exacerbation with vascular congestion and elevated BNP; patient also with concern for bronchitic changes and diffuse wheezing requiring high level of oxygen supplementation.  IV Lasix, steroids and bronchodilators were provided.  TRH has been consulted to place patient in the hospital for further evaluation and management.   Assessment/Plan:  Acute on chronic respiratory failure with hypoxia -Multifactorial: In the setting of COPD/bronchiectasis exacerbation and acute on chronic diastolic heart failure -initially on 4L -chronically on 2L -wean oxygen for saturation >92%   Acute on chronic HFpEF -07/30/23 Echo EF 55%, no WMA, G1DD, normal RVF -remains clinically fluid overloaded -continue IV lasix -I/O incomplete -obtain ReDS vest reading   COPD Exacerbation -nearly 60 pack years tobacco, quit 10 yrs ago -continue duoneb -change solumedrol to  prednisone -continue brovana and pulmicort  CHB s/p PPM -follow up Dr. Johney Frame   Hyperlipidemia -Continue Lipitor.   Type 2 diabetes with hyperglycemia -A1c 5.8 -Continue sliding scale insulin -Continue holding oral hypoglycemic agents -Follow CBGs fluctuation and adjust hypoglycemic regimen as required.   Class III obesity -Body mass index is 42.29 kg/m. -Low-calorie diet, portion control and increase physical activity discussed with patient.   Essential hypertension -Continue home antihypertensive regimen -Heart healthy/low-sodium diet discussed with patient -Continue to follow vital signs.   Hyponatremia: -due to fluid overload -improving with diuresis   History of PAD -Continue risk factor modification -Status post right BKA -Patient will continue the use of prosthesis to facilitate ambulation.                  Family Communication:  no Family at bedside   Consultants:  cardiology   Code Status:  FULL    DVT Prophylaxis:  McCall Lovenox     Procedures: As Listed in Progress Note Above   Antibiotics: None       Subjective: Pt breathing better but still has dyspnea on exertion.  Denies f/c., cp, n/v/d  Objective: Vitals:   08/01/23 0451 08/01/23 0904 08/01/23 0907 08/01/23 0910  BP: 118/64     Pulse: (!) 50     Resp:      Temp: 98.2 F (36.8 C)     TempSrc: Oral     SpO2: 94% 95% 99% 100%  Weight: 121.6 kg     Height:        Intake/Output Summary (Last 24 hours) at 08/01/2023 1029 Last data filed at 07/31/2023 1750 Gross per 24 hour  Intake 720  PROGRESS NOTE  [name]:[id number] DOB: 1950-12-30 DOA: 07/30/2023 PCP: [facility]  Brief History:  72 y.o. 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  Patient reports waking up requiring up to 4 L to maintain saturation and very short winded with any activity.  He has noticed in the last week or so increase weight gain, lower extremity swelling and in the last 48 hours swollen hypersomnia. Patient expressed no shortness of breath, no nausea, no vomiting, no coughing spells and no sick contact.  He had also expressed no dysuria, no hematuria, no melena, no hematochezia, no dysuria, no hematuria, no focal weaknesses or any other complaints.   Workup in the ED demonstrating the presence of acute on chronic CHF exacerbation with vascular congestion and elevated BNP; patient also with concern for bronchitic changes and diffuse wheezing requiring high level of oxygen supplementation.  IV Lasix, steroids and bronchodilators were provided.  TRH has been consulted to place patient in the hospital for further evaluation and management.   Assessment/Plan:  Acute on chronic respiratory failure with hypoxia -Multifactorial: In the setting of COPD/bronchiectasis exacerbation and acute on chronic diastolic heart failure -initially on 4L -chronically on 2L -wean oxygen for saturation >92%   Acute on chronic HFpEF -07/30/23 Echo EF 55%, no WMA, G1DD, normal RVF -remains clinically fluid overloaded -continue IV lasix -I/O incomplete -obtain ReDS vest reading   COPD Exacerbation -nearly 60 pack years tobacco, quit 10 yrs ago -continue duoneb -change solumedrol to  prednisone -continue brovana and pulmicort  CHB s/p PPM -follow up Dr. Johney Frame   Hyperlipidemia -Continue Lipitor.   Type 2 diabetes with hyperglycemia -A1c 5.8 -Continue sliding scale insulin -Continue holding oral hypoglycemic agents -Follow CBGs fluctuation and adjust hypoglycemic regimen as required.   Class III obesity -Body mass index is 42.29 kg/m. -Low-calorie diet, portion control and increase physical activity discussed with patient.   Essential hypertension -Continue home antihypertensive regimen -Heart healthy/low-sodium diet discussed with patient -Continue to follow vital signs.   Hyponatremia: -due to fluid overload -improving with diuresis   History of PAD -Continue risk factor modification -Status post right BKA -Patient will continue the use of prosthesis to facilitate ambulation.                  Family Communication:  no Family at bedside   Consultants:  cardiology   Code Status:  FULL    DVT Prophylaxis:  McCall Lovenox     Procedures: As Listed in Progress Note Above   Antibiotics: None       Subjective: Pt breathing better but still has dyspnea on exertion.  Denies f/c., cp, n/v/d  Objective: Vitals:   08/01/23 0451 08/01/23 0904 08/01/23 0907 08/01/23 0910  BP: 118/64     Pulse: (!) 50     Resp:      Temp: 98.2 F (36.8 C)     TempSrc: Oral     SpO2: 94% 95% 99% 100%  Weight: 121.6 kg     Height:        Intake/Output Summary (Last 24 hours) at 08/01/2023 1029 Last data filed at 07/31/2023 1750 Gross per 24 hour  Intake 720  PROGRESS NOTE  [name]:[id number] DOB: 1950-12-30 DOA: 07/30/2023 [facility]: [facility]  Brief History:  72 y.o. 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  Patient reports waking up requiring up to 4 L to maintain saturation and very short winded with any activity.  He has noticed in the last week or so increase weight gain, lower extremity swelling and in the last 48 hours swollen hypersomnia. Patient expressed no shortness of breath, no nausea, no vomiting, no coughing spells and no sick contact.  He had also expressed no dysuria, no hematuria, no melena, no hematochezia, no dysuria, no hematuria, no focal weaknesses or any other complaints.   Workup in the ED demonstrating the presence of acute on chronic CHF exacerbation with vascular congestion and elevated BNP; patient also with concern for bronchitic changes and diffuse wheezing requiring high level of oxygen supplementation.  IV Lasix, steroids and bronchodilators were provided.  TRH has been consulted to place patient in the hospital for further evaluation and management.   Assessment/Plan:  Acute on chronic respiratory failure with hypoxia -Multifactorial: In the setting of COPD/bronchiectasis exacerbation and acute on chronic diastolic heart failure -initially on 4L -chronically on 2L -wean oxygen for saturation >92%   Acute on chronic HFpEF -07/30/23 Echo EF 55%, no WMA, G1DD, normal RVF -remains clinically fluid overloaded -continue IV lasix -I/O incomplete -obtain ReDS vest reading   COPD Exacerbation -nearly 60 pack years tobacco, quit 10 yrs ago -continue duoneb -change solumedrol to  prednisone -continue brovana and pulmicort  CHB s/p PPM -follow up Dr. Johney Frame   Hyperlipidemia -Continue Lipitor.   Type 2 diabetes with hyperglycemia -A1c 5.8 -Continue sliding scale insulin -Continue holding oral hypoglycemic agents -Follow CBGs fluctuation and adjust hypoglycemic regimen as required.   Class III obesity -Body mass index is 42.29 kg/m. -Low-calorie diet, portion control and increase physical activity discussed with patient.   Essential hypertension -Continue home antihypertensive regimen -Heart healthy/low-sodium diet discussed with patient -Continue to follow vital signs.   Hyponatremia: -due to fluid overload -improving with diuresis   History of PAD -Continue risk factor modification -Status post right BKA -Patient will continue the use of prosthesis to facilitate ambulation.                  Family Communication:  no Family at bedside   Consultants:  cardiology   Code Status:  FULL    DVT Prophylaxis:  McCall Lovenox     Procedures: As Listed in Progress Note Above   Antibiotics: None       Subjective: Pt breathing better but still has dyspnea on exertion.  Denies f/c., cp, n/v/d  Objective: Vitals:   08/01/23 0451 08/01/23 0904 08/01/23 0907 08/01/23 0910  BP: 118/64     Pulse: (!) 50     Resp:      Temp: 98.2 F (36.8 C)     TempSrc: Oral     SpO2: 94% 95% 99% 100%  Weight: 121.6 kg     Height:        Intake/Output Summary (Last 24 hours) at 08/01/2023 1029 Last data filed at 07/31/2023 1750 Gross per 24 hour  Intake 720  in effect (meaning this test can be used) for the duration of the COVID-19 declaration under Section 564(b)(1) of the Act, 21 U.S.C.section 360bbb-3(b)(1), unless the authorization is terminated  or revoked sooner.       Influenza A by PCR NEGATIVE NEGATIVE Final   Influenza B by PCR NEGATIVE NEGATIVE Final    Comment: (NOTE) The Xpert Xpress SARS-CoV-2/FLU/RSV plus assay is intended as an aid in the diagnosis of influenza from Nasopharyngeal swab specimens and should not be used as a sole basis for treatment. Nasal washings and aspirates are unacceptable for Xpert Xpress SARS-CoV-2/FLU/RSV testing.  Fact Sheet for  Patients: BloggerCourse.com  Fact Sheet for Healthcare Providers: SeriousBroker.it  This test is not yet approved or cleared by the [place] FDA and has been authorized for detection and/or diagnosis of SARS-CoV-2 by FDA under an Emergency Use Authorization (EUA). This EUA will remain in effect (meaning this test can be used) for the duration of the COVID-19 declaration under Section 564(b)(1) of the Act, 21 U.S.C. section 360bbb-3(b)(1), unless the authorization is terminated or revoked.     Resp Syncytial Virus by PCR NEGATIVE NEGATIVE Final    Comment: (NOTE) Fact Sheet for Patients: BloggerCourse.com  Fact Sheet for Healthcare Providers: SeriousBroker.it  This test is not yet approved or cleared by the [place] FDA and has been authorized for detection and/or diagnosis of SARS-CoV-2 by FDA under an Emergency Use Authorization (EUA). This EUA will remain in effect (meaning this test can be used) for the duration of the COVID-19 declaration under Section 564(b)(1) of the Act, 21 U.S.C. section 360bbb-3(b)(1), unless the authorization is terminated or revoked.  Performed at [facility], [address], [place], [place] [address]      Scheduled Meds:  arformoterol  15 mcg Nebulization BID   aspirin EC  81 mg Oral Daily   atorvastatin  80 mg Oral Daily   budesonide (PULMICORT) nebulizer solution  0.5 mg Nebulization BID   carvedilol  25 mg Oral BID WC   dextromethorphan-guaiFENesin  1 tablet Oral BID   doxycycline  100 mg Oral Q12H   enoxaparin (LOVENOX) injection  60 mg Subcutaneous Q24H   furosemide  40 mg Intravenous Q12H   insulin aspart  0-15 Units Subcutaneous TID WC   insulin aspart  0-5 Units Subcutaneous QHS   insulin glargine-yfgn  7 Units Subcutaneous QHS   methylPREDNISolone (SOLU-MEDROL) injection  40 mg Intravenous Q12H   sodium chloride flush  3 mL  Intravenous Q12H   Continuous Infusions:  sodium chloride      Procedures/Studies: [place] EKG SITE RITE  Result Date: 08/01/2023 If Site Rite image not attached, placement could not be confirmed due to current cardiac rhythm.  ECHOCARDIOGRAM COMPLETE  Result Date: 07/30/2023    ECHOCARDIOGRAM REPORT   Patient Name:   Nathan Miller Date of Exam: 07/30/2023 Medical Rec #:  [id number]    Height:       67.0 in Accession #:    [id number]   Weight:       270.0 lb Date of Birth:  08-27-1951   BSA:          2.297 m Patient Age:    89 years     BP:           107/74 mmHg Patient Gender: M            HR:           70 bpm. Exam Location:  Inpatient Procedure: 2D Echo,  in effect (meaning this test can be used) for the duration of the COVID-19 declaration under Section 564(b)(1) of the Act, 21 U.S.C.section 360bbb-3(b)(1), unless the authorization is terminated  or revoked sooner.       Influenza A by PCR NEGATIVE NEGATIVE Final   Influenza B by PCR NEGATIVE NEGATIVE Final    Comment: (NOTE) The Xpert Xpress SARS-CoV-2/FLU/RSV plus assay is intended as an aid in the diagnosis of influenza from Nasopharyngeal swab specimens and should not be used as a sole basis for treatment. Nasal washings and aspirates are unacceptable for Xpert Xpress SARS-CoV-2/FLU/RSV testing.  Fact Sheet for  Patients: BloggerCourse.com  Fact Sheet for Healthcare Providers: SeriousBroker.it  This test is not yet approved or cleared by the [place] FDA and has been authorized for detection and/or diagnosis of SARS-CoV-2 by FDA under an Emergency Use Authorization (EUA). This EUA will remain in effect (meaning this test can be used) for the duration of the COVID-19 declaration under Section 564(b)(1) of the Act, 21 U.S.C. section 360bbb-3(b)(1), unless the authorization is terminated or revoked.     Resp Syncytial Virus by PCR NEGATIVE NEGATIVE Final    Comment: (NOTE) Fact Sheet for Patients: BloggerCourse.com  Fact Sheet for Healthcare Providers: SeriousBroker.it  This test is not yet approved or cleared by the [place] FDA and has been authorized for detection and/or diagnosis of SARS-CoV-2 by FDA under an Emergency Use Authorization (EUA). This EUA will remain in effect (meaning this test can be used) for the duration of the COVID-19 declaration under Section 564(b)(1) of the Act, 21 U.S.C. section 360bbb-3(b)(1), unless the authorization is terminated or revoked.  Performed at [facility], [address], [place], [place] [address]      Scheduled Meds:  arformoterol  15 mcg Nebulization BID   aspirin EC  81 mg Oral Daily   atorvastatin  80 mg Oral Daily   budesonide (PULMICORT) nebulizer solution  0.5 mg Nebulization BID   carvedilol  25 mg Oral BID WC   dextromethorphan-guaiFENesin  1 tablet Oral BID   doxycycline  100 mg Oral Q12H   enoxaparin (LOVENOX) injection  60 mg Subcutaneous Q24H   furosemide  40 mg Intravenous Q12H   insulin aspart  0-15 Units Subcutaneous TID WC   insulin aspart  0-5 Units Subcutaneous QHS   insulin glargine-yfgn  7 Units Subcutaneous QHS   methylPREDNISolone (SOLU-MEDROL) injection  40 mg Intravenous Q12H   sodium chloride flush  3 mL  Intravenous Q12H   Continuous Infusions:  sodium chloride      Procedures/Studies: [place] EKG SITE RITE  Result Date: 08/01/2023 If Site Rite image not attached, placement could not be confirmed due to current cardiac rhythm.  ECHOCARDIOGRAM COMPLETE  Result Date: 07/30/2023    ECHOCARDIOGRAM REPORT   Patient Name:   Nathan Miller Date of Exam: 07/30/2023 Medical Rec #:  [id number]    Height:       67.0 in Accession #:    [id number]   Weight:       270.0 lb Date of Birth:  08-27-1951   BSA:          2.297 m Patient Age:    89 years     BP:           107/74 mmHg Patient Gender: M            HR:           70 bpm. Exam Location:  Inpatient Procedure: 2D Echo,

## 2023-08-01 NOTE — TOC Progression Note (Signed)
Transition of Care [facility]) - Progression Note    Patient Details  Name: Nathan Miller MRN: [id number] Date of Birth: 1951/07/08  [facility]) CM/SW Contact  Karn Cassis, [place] Phone Number: 08/01/2023, 11:02 AM  Clinical Narrative:  LCSW confirmed nebulizer delivered to room by [employer]. PT evaluated pt and recommend HHPT. Discussed with pt who is agreeable and requests [facility]. Morrie Sheldon with [facility] accepts. Will need HHPT order. TOC will follow.      Expected Discharge Plan: Home/Self Care Barriers to Discharge: Continued Medical Work up  Expected Discharge Plan and Services In-house Referral: Clinical Social Work     Living arrangements for the past 2 months: Single Family Home                 DME Arranged: Nebulizer/meds DME Agency: [facility] Date DME Agency Contacted: 07/31/23 Time DME Agency Contacted: 1357 Representative spoke with at DME Agency: Mitch HH Arranged: PT HH Agency: [facility] Date HH Agency Contacted: 08/01/23 Time HH Agency Contacted: 1102 Representative spoke with at [facility] Agency: Morrie Sheldon   Social Determinants of Health (SDOH) Interventions SDOH Screenings   Food Insecurity: No Food Insecurity (07/30/2023)  Housing: Low Risk  (07/30/2023)  Transportation Needs: No Transportation Needs (07/30/2023)  Utilities: Not At Risk (07/30/2023)  Financial Resource Strain: Low Risk  (06/28/2021)   Received from [facility]  Physical Activity: Inactive (07/13/2021)   Received from [facility]  Social Connections: Moderately Integrated (07/13/2021)   Received from [facility], [facility]  Stress: No Stress Concern Present (07/13/2021)   Received from [facility], [facility]  Tobacco Use: High Risk (07/30/2023)  Health Literacy: Low Risk  (07/13/2021)   Received from [facility], [facility]    Readmission Risk Interventions     No data to display

## 2023-08-02 DIAGNOSIS — I5033 Acute on chronic diastolic (congestive) heart failure: Secondary | ICD-10-CM | POA: Diagnosis not present

## 2023-08-02 DIAGNOSIS — J441 Chronic obstructive pulmonary disease with (acute) exacerbation: Secondary | ICD-10-CM | POA: Diagnosis not present

## 2023-08-02 DIAGNOSIS — J9621 Acute and chronic respiratory failure with hypoxia: Secondary | ICD-10-CM | POA: Diagnosis not present

## 2023-08-02 LAB — GLUCOSE, CAPILLARY
Glucose-Capillary: 127 mg/dL — ABNORMAL HIGH (ref 70–99)
Glucose-Capillary: 138 mg/dL — ABNORMAL HIGH (ref 70–99)

## 2023-08-02 LAB — BASIC METABOLIC PANEL
Anion gap: 10 (ref 5–15)
BUN: 19 mg/dL (ref 8–23)
CO2: 37 mmol/L — ABNORMAL HIGH (ref 22–32)
Calcium: 8.5 mg/dL — ABNORMAL LOW (ref 8.9–10.3)
Chloride: 82 mmol/L — ABNORMAL LOW (ref 98–111)
Creatinine, Ser: 0.81 mg/dL (ref 0.61–1.24)
GFR, Estimated: >60 mL/min (ref >60)
Glucose, Bld: 132 mg/dL — ABNORMAL HIGH (ref 70–99)
Potassium: 3.3 mmol/L — ABNORMAL LOW (ref 3.5–5.1)
Sodium: 129 mmol/L — ABNORMAL LOW (ref 135–145)

## 2023-08-02 LAB — MAGNESIUM: Magnesium: 1.9 mg/dL (ref 1.7–2.4)

## 2023-08-02 MED ORDER — TORSEMIDE 20 MG PO TABS: 20.0000 mg | ORAL_TABLET | Freq: Two times a day (BID) | ORAL | 1 refills | Status: DC

## 2023-08-02 MED ORDER — DOXYCYCLINE HYCLATE 100 MG PO TABS: 100.0000 mg | ORAL_TABLET | Freq: Two times a day (BID) | ORAL | 0 refills | Status: DC

## 2023-08-02 MED ORDER — NYSTATIN 100000 UNIT/GM EX POWD
Freq: Three times a day (TID) | CUTANEOUS | Status: DC
  Filled 2023-08-02: qty 30

## 2023-08-02 MED ORDER — POTASSIUM CHLORIDE CRYS ER 20 MEQ PO TBCR
40.0000 meq | EXTENDED_RELEASE_TABLET | Freq: Four times a day (QID) | ORAL | Status: DC
  Administered 2023-08-02: 40 meq via ORAL
  Filled 2023-08-02: qty 2

## 2023-08-02 MED ORDER — POTASSIUM CHLORIDE CRYS ER 20 MEQ PO TBCR: 40.0000 meq | EXTENDED_RELEASE_TABLET | Freq: Every day | ORAL | 0 refills | Status: DC

## 2023-08-02 MED ORDER — EMPAGLIFLOZIN 10 MG PO TABS
10.0000 mg | ORAL_TABLET | Freq: Every day | ORAL | Status: DC
  Filled 2023-08-02: qty 1

## 2023-08-02 MED ORDER — EMPAGLIFLOZIN 10 MG PO TABS: 10.0000 mg | ORAL_TABLET | Freq: Every day | ORAL | 1 refills | Status: DC

## 2023-08-02 MED ORDER — TORSEMIDE 20 MG PO TABS: 20.0000 mg | ORAL_TABLET | Freq: Two times a day (BID) | ORAL | Status: DC

## 2023-08-02 NOTE — Progress Notes (Signed)
Slept well during night. Wife took off prosthesis before she left yesterday evening.  Still has some edema in lower ext. O2 93% on 2 liters chronic

## 2023-08-02 NOTE — TOC Transition Note (Signed)
Transition of Care [facility]) - CM/SW Discharge Note   Patient Details  Name: Nathan Miller MRN: [id number] Date of Birth: 04-10-1951  [facility]) CM/SW Contact:  Karn Cassis, LCSW Phone Number: 08/02/2023, 1:07 PM   Clinical Narrative:  Pt d/c today. Morrie Sheldon with [facility] notified. HHPT order in.      Final next level of care: Home w Home Health Services Barriers to Discharge: Barriers Resolved   Patient Goals and CMS Choice   Choice offered to / list presented to : Patient  Discharge Placement                         Discharge Plan and Services Additional resources added to the After Visit Summary for   In-house Referral: Clinical Social Work              DME Arranged: Nebulizer/meds DME Agency: [facility] Date DME Agency Contacted: 07/31/23 Time DME Agency Contacted: 1357 Representative spoke with at DME Agency: Marthann Schiller HH Arranged: PT HH Agency: [facility] Date HH Agency Contacted: 08/01/23 Time HH Agency Contacted: 1102 Representative spoke with at [facility] Agency: Morrie Sheldon  Social Determinants of Health (SDOH) Interventions SDOH Screenings   Food Insecurity: No Food Insecurity (07/30/2023)  Housing: Low Risk  (07/30/2023)  Transportation Needs: No Transportation Needs (07/30/2023)  Utilities: Not At Risk (07/30/2023)  Financial Resource Strain: Low Risk  (06/28/2021)   Received from [facility]  Physical Activity: Inactive (07/13/2021)   Received from [facility]  Social Connections: Moderately Integrated (07/13/2021)   Received from [facility], [facility]  Stress: No Stress Concern Present (07/13/2021)   Received from [facility], [facility]  Tobacco Use: High Risk (07/30/2023)  Health Literacy: Low Risk  (07/13/2021)   Received from [facility], [facility]     Readmission Risk Interventions     No data to display

## 2023-08-02 NOTE — Care Management Important Message (Signed)
Important Message  Patient Details  Name: Nathan Miller MRN: [id number] Date of Birth: [birth year]   Important Message Given:  Yes - Medicare IM     Corey Harold 08/02/2023, 1:08 PM

## 2023-08-02 NOTE — Discharge Summary (Signed)
Physician Discharge Summary   Patient: Nathan Miller MRN: [id number] DOB: 1951-08-26  Admit date:     07/30/2023  Discharge date: 08/02/23  Discharge Physician: Onalee Hua Pranshu Lyster   PCP: [facility]   Recommendations at discharge:   Please follow up with primary care provider within 1-2 weeks  Please repeat BMP and CBC in one week  Please follow up on/with  cardiology, Dr. Antoine Poche on 10/9     Hospital Course: 72 y.o. 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  Patient reports waking up requiring up to 4 L to maintain saturation and very short winded with any activity.  He has noticed in the last week or so increase weight gain, lower extremity swelling and in the last 48 hours swollen hypersomnia. Patient expressed no shortness of breath, no nausea, no vomiting, no coughing spells and no sick contact.  He had also expressed no dysuria, no hematuria, no melena, no hematochezia, no dysuria, no hematuria, no focal weaknesses or any other complaints.   Workup in the ED demonstrating the presence of acute on chronic CHF exacerbation with vascular congestion and elevated BNP; patient also with concern for bronchitic changes and diffuse wheezing requiring high level of oxygen supplementation.  IV Lasix, steroids and bronchodilators were provided.  TRH has been consulted to place patient in the hospital for further evaluation and management.  Assessment and Plan: -Multifactorial: In the setting of COPD/bronchiectasis exacerbation and acute on chronic diastolic heart failure -initially on 4L>>weaned to 2L -chronically on 2L -wean oxygen for saturation >92%   Acute on chronic  HFpEF -07/30/23 Echo EF 55%, no WMA, G1DD, normal RVF -remains clinically fluid overloaded -continue IV lasix>>d/c home with torsemide 20 mg bid -I/O incomplete -obtain ReDS vest reading--initially 38 -appreciate cardiology consult>>d/c home with torsemide 20 mg bid, Jardiance 10 mg daily, KCl 40 mEQ daily   COPD Exacerbation -nearly 60 pack years tobacco, quit 10 yrs ago -continue duoneb -change solumedrol to prednisone>>d/c, no further wheezing -continue brovana and pulmicort -d/c home with doxy x 3 more days   CHB s/p PPM -follow up Dr. Johney Frame   Hyperlipidemia -Continue Lipitor.   Type 2 diabetes with hyperglycemia -A1c 5.8 -Continue sliding scale insulin -Continue holding oral hypoglycemic agents -Follow CBGs fluctuation and adjust hypoglycemic regimen as required.   Class III obesity -Body mass index is 42.29 kg/m. -Low-calorie diet, portion control and increase physical activity discussed with patient.   Essential hypertension -Continue home antihypertensive regimen--coreg -Heart healthy/low-sodium diet discussed with patient -Continue to follow vital signs.   Hyponatremia: -due to fluid overload -improving with diuresis   History of PAD -Continue risk factor modification -Status post right BKA -Patient will continue the use of prosthesis to facilitate ambulation.           Consultants: cardiology Procedures performed: none  Disposition: Home Diet recommendation:  Cardiac and Carb modified diet DISCHARGE MEDICATION: Allergies as of 08/02/2023   No Known Allergies      Medication List     TAKE these medications    acetaminophen 500 MG tablet Commonly known as: TYLENOL Take 500 mg by mouth every 6 (six) hours as needed for mild pain or fever.   albuterol 108 (90 Base) MCG/ACT inhaler Commonly  [id number]   Weight:       270.0 lb Date of Birth:  [birth year]   BSA:          2.297 m Patient Age:    72 years     BP:           107/74 mmHg Patient Gender: M            HR:           70 bpm. Exam Location:  Inpatient Procedure: 2D Echo, Cardiac Doppler, Color Doppler and Intracardiac            Opacification Agent Indications:    Bacteremia R78.81  History:        Patient has prior history of Echocardiogram examinations, most                 recent 03/31/2021.  Sonographer:    Harriette Bouillon RDCS Referring Phys: [phone] CARLOS MADERA IMPRESSIONS  1. Left ventricular ejection fraction, by estimation, is approximately 55%. The left ventricle has normal function. The left ventricle has no regional wall motion abnormalities. There is mild left ventricular hypertrophy. Left ventricular diastolic parameters are consistent with Grade I diastolic dysfunction (impaired relaxation).  2. Right ventricular systolic function is normal. The right ventricular size is normal. Tricuspid regurgitation signal is inadequate  for assessing PA pressure.  3. Left atrial size was mildly dilated.  4. Right atrial size was mildly dilated.  5. The mitral valve is grossly normal. Trivial mitral valve regurgitation.  6. The aortic valve is tricuspid. Aortic valve regurgitation is not visualized.  7. The inferior vena cava is normal in size with <50% respiratory variability, suggesting right atrial pressure of 8 mmHg.  8. No obvious valvular vegetations. Comparison(s): Prior images reviewed side by side. LVEF normal at approximately 55%. FINDINGS  Left Ventricle: Left ventricular ejection fraction, by estimation, is 55%. The left ventricle has normal function. The left ventricle has no regional wall motion abnormalities. Definity contrast agent was given IV to delineate the left ventricular endocardial borders. The left ventricular internal cavity size was normal in size. There is mild left ventricular hypertrophy. Abnormal (paradoxical) septal motion, consistent with RV pacemaker. Left ventricular diastolic parameters are consistent with Grade I diastolic dysfunction (impaired relaxation). Right Ventricle: The right ventricular size is normal. No increase in right ventricular wall thickness. Right ventricular systolic function is normal. Tricuspid regurgitation signal is inadequate for assessing PA pressure. Left Atrium: Left atrial size was mildly dilated. Right Atrium: Right atrial size was mildly dilated. Pericardium: Trivial pericardial effusion is present. The pericardial effusion is posterior to the left ventricle. Presence of epicardial fat layer. Mitral Valve: The mitral valve is grossly normal. Mild mitral annular calcification. Trivial mitral valve regurgitation. Tricuspid Valve: The tricuspid valve is grossly normal. Tricuspid valve regurgitation is trivial. Aortic Valve: The aortic valve is tricuspid. There is mild aortic valve annular calcification. Aortic valve regurgitation is not visualized. Pulmonic Valve: The pulmonic valve was  not well visualized. Pulmonic valve regurgitation is trivial. Aorta: The aortic root is normal in size and structure. Venous: The inferior vena cava is normal in size with less than 50% respiratory variability, suggesting right atrial pressure of 8 mmHg. IAS/Shunts: No atrial level shunt detected by color flow Doppler. Additional Comments: A device lead is visualized.  LEFT VENTRICLE PLAX 2D LVIDd:         6.20 cm LVIDs:         4.60 cm LV PW:  [id number]   Weight:       270.0 lb Date of Birth:  [birth year]   BSA:          2.297 m Patient Age:    72 years     BP:           107/74 mmHg Patient Gender: M            HR:           70 bpm. Exam Location:  Inpatient Procedure: 2D Echo, Cardiac Doppler, Color Doppler and Intracardiac            Opacification Agent Indications:    Bacteremia R78.81  History:        Patient has prior history of Echocardiogram examinations, most                 recent 03/31/2021.  Sonographer:    Harriette Bouillon RDCS Referring Phys: [phone] CARLOS MADERA IMPRESSIONS  1. Left ventricular ejection fraction, by estimation, is approximately 55%. The left ventricle has normal function. The left ventricle has no regional wall motion abnormalities. There is mild left ventricular hypertrophy. Left ventricular diastolic parameters are consistent with Grade I diastolic dysfunction (impaired relaxation).  2. Right ventricular systolic function is normal. The right ventricular size is normal. Tricuspid regurgitation signal is inadequate  for assessing PA pressure.  3. Left atrial size was mildly dilated.  4. Right atrial size was mildly dilated.  5. The mitral valve is grossly normal. Trivial mitral valve regurgitation.  6. The aortic valve is tricuspid. Aortic valve regurgitation is not visualized.  7. The inferior vena cava is normal in size with <50% respiratory variability, suggesting right atrial pressure of 8 mmHg.  8. No obvious valvular vegetations. Comparison(s): Prior images reviewed side by side. LVEF normal at approximately 55%. FINDINGS  Left Ventricle: Left ventricular ejection fraction, by estimation, is 55%. The left ventricle has normal function. The left ventricle has no regional wall motion abnormalities. Definity contrast agent was given IV to delineate the left ventricular endocardial borders. The left ventricular internal cavity size was normal in size. There is mild left ventricular hypertrophy. Abnormal (paradoxical) septal motion, consistent with RV pacemaker. Left ventricular diastolic parameters are consistent with Grade I diastolic dysfunction (impaired relaxation). Right Ventricle: The right ventricular size is normal. No increase in right ventricular wall thickness. Right ventricular systolic function is normal. Tricuspid regurgitation signal is inadequate for assessing PA pressure. Left Atrium: Left atrial size was mildly dilated. Right Atrium: Right atrial size was mildly dilated. Pericardium: Trivial pericardial effusion is present. The pericardial effusion is posterior to the left ventricle. Presence of epicardial fat layer. Mitral Valve: The mitral valve is grossly normal. Mild mitral annular calcification. Trivial mitral valve regurgitation. Tricuspid Valve: The tricuspid valve is grossly normal. Tricuspid valve regurgitation is trivial. Aortic Valve: The aortic valve is tricuspid. There is mild aortic valve annular calcification. Aortic valve regurgitation is not visualized. Pulmonic Valve: The pulmonic valve was  not well visualized. Pulmonic valve regurgitation is trivial. Aorta: The aortic root is normal in size and structure. Venous: The inferior vena cava is normal in size with less than 50% respiratory variability, suggesting right atrial pressure of 8 mmHg. IAS/Shunts: No atrial level shunt detected by color flow Doppler. Additional Comments: A device lead is visualized.  LEFT VENTRICLE PLAX 2D LVIDd:         6.20 cm LVIDs:         4.60 cm LV PW:  Physician Discharge Summary   Patient: Nathan Miller MRN: [id number] DOB: 1951-08-26  Admit date:     07/30/2023  Discharge date: 08/02/23  Discharge Physician: Onalee Hua Pranshu Lyster   PCP: [facility]   Recommendations at discharge:   Please follow up with primary care provider within 1-2 weeks  Please repeat BMP and CBC in one week  Please follow up on/with  cardiology, Dr. Antoine Poche on 10/9     Hospital Course: 72 y.o. 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  Patient reports waking up requiring up to 4 L to maintain saturation and very short winded with any activity.  He has noticed in the last week or so increase weight gain, lower extremity swelling and in the last 48 hours swollen hypersomnia. Patient expressed no shortness of breath, no nausea, no vomiting, no coughing spells and no sick contact.  He had also expressed no dysuria, no hematuria, no melena, no hematochezia, no dysuria, no hematuria, no focal weaknesses or any other complaints.   Workup in the ED demonstrating the presence of acute on chronic CHF exacerbation with vascular congestion and elevated BNP; patient also with concern for bronchitic changes and diffuse wheezing requiring high level of oxygen supplementation.  IV Lasix, steroids and bronchodilators were provided.  TRH has been consulted to place patient in the hospital for further evaluation and management.  Assessment and Plan: -Multifactorial: In the setting of COPD/bronchiectasis exacerbation and acute on chronic diastolic heart failure -initially on 4L>>weaned to 2L -chronically on 2L -wean oxygen for saturation >92%   Acute on chronic  HFpEF -07/30/23 Echo EF 55%, no WMA, G1DD, normal RVF -remains clinically fluid overloaded -continue IV lasix>>d/c home with torsemide 20 mg bid -I/O incomplete -obtain ReDS vest reading--initially 38 -appreciate cardiology consult>>d/c home with torsemide 20 mg bid, Jardiance 10 mg daily, KCl 40 mEQ daily   COPD Exacerbation -nearly 60 pack years tobacco, quit 10 yrs ago -continue duoneb -change solumedrol to prednisone>>d/c, no further wheezing -continue brovana and pulmicort -d/c home with doxy x 3 more days   CHB s/p PPM -follow up Dr. Johney Frame   Hyperlipidemia -Continue Lipitor.   Type 2 diabetes with hyperglycemia -A1c 5.8 -Continue sliding scale insulin -Continue holding oral hypoglycemic agents -Follow CBGs fluctuation and adjust hypoglycemic regimen as required.   Class III obesity -Body mass index is 42.29 kg/m. -Low-calorie diet, portion control and increase physical activity discussed with patient.   Essential hypertension -Continue home antihypertensive regimen--coreg -Heart healthy/low-sodium diet discussed with patient -Continue to follow vital signs.   Hyponatremia: -due to fluid overload -improving with diuresis   History of PAD -Continue risk factor modification -Status post right BKA -Patient will continue the use of prosthesis to facilitate ambulation.           Consultants: cardiology Procedures performed: none  Disposition: Home Diet recommendation:  Cardiac and Carb modified diet DISCHARGE MEDICATION: Allergies as of 08/02/2023   No Known Allergies      Medication List     TAKE these medications    acetaminophen 500 MG tablet Commonly known as: TYLENOL Take 500 mg by mouth every 6 (six) hours as needed for mild pain or fever.   albuterol 108 (90 Base) MCG/ACT inhaler Commonly  Physician Discharge Summary   Patient: Nathan Miller MRN: [id number] DOB: 1951-08-26  Admit date:     07/30/2023  Discharge date: 08/02/23  Discharge Physician: Onalee Hua Pranshu Lyster   PCP: [facility]   Recommendations at discharge:   Please follow up with primary care provider within 1-2 weeks  Please repeat BMP and CBC in one week  Please follow up on/with  cardiology, Dr. Antoine Poche on 10/9     Hospital Course: 72 y.o. 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  Patient reports waking up requiring up to 4 L to maintain saturation and very short winded with any activity.  He has noticed in the last week or so increase weight gain, lower extremity swelling and in the last 48 hours swollen hypersomnia. Patient expressed no shortness of breath, no nausea, no vomiting, no coughing spells and no sick contact.  He had also expressed no dysuria, no hematuria, no melena, no hematochezia, no dysuria, no hematuria, no focal weaknesses or any other complaints.   Workup in the ED demonstrating the presence of acute on chronic CHF exacerbation with vascular congestion and elevated BNP; patient also with concern for bronchitic changes and diffuse wheezing requiring high level of oxygen supplementation.  IV Lasix, steroids and bronchodilators were provided.  TRH has been consulted to place patient in the hospital for further evaluation and management.  Assessment and Plan: -Multifactorial: In the setting of COPD/bronchiectasis exacerbation and acute on chronic diastolic heart failure -initially on 4L>>weaned to 2L -chronically on 2L -wean oxygen for saturation >92%   Acute on chronic  HFpEF -07/30/23 Echo EF 55%, no WMA, G1DD, normal RVF -remains clinically fluid overloaded -continue IV lasix>>d/c home with torsemide 20 mg bid -I/O incomplete -obtain ReDS vest reading--initially 38 -appreciate cardiology consult>>d/c home with torsemide 20 mg bid, Jardiance 10 mg daily, KCl 40 mEQ daily   COPD Exacerbation -nearly 60 pack years tobacco, quit 10 yrs ago -continue duoneb -change solumedrol to prednisone>>d/c, no further wheezing -continue brovana and pulmicort -d/c home with doxy x 3 more days   CHB s/p PPM -follow up Dr. Johney Frame   Hyperlipidemia -Continue Lipitor.   Type 2 diabetes with hyperglycemia -A1c 5.8 -Continue sliding scale insulin -Continue holding oral hypoglycemic agents -Follow CBGs fluctuation and adjust hypoglycemic regimen as required.   Class III obesity -Body mass index is 42.29 kg/m. -Low-calorie diet, portion control and increase physical activity discussed with patient.   Essential hypertension -Continue home antihypertensive regimen--coreg -Heart healthy/low-sodium diet discussed with patient -Continue to follow vital signs.   Hyponatremia: -due to fluid overload -improving with diuresis   History of PAD -Continue risk factor modification -Status post right BKA -Patient will continue the use of prosthesis to facilitate ambulation.           Consultants: cardiology Procedures performed: none  Disposition: Home Diet recommendation:  Cardiac and Carb modified diet DISCHARGE MEDICATION: Allergies as of 08/02/2023   No Known Allergies      Medication List     TAKE these medications    acetaminophen 500 MG tablet Commonly known as: TYLENOL Take 500 mg by mouth every 6 (six) hours as needed for mild pain or fever.   albuterol 108 (90 Base) MCG/ACT inhaler Commonly  [id number]   Weight:       270.0 lb Date of Birth:  [birth year]   BSA:          2.297 m Patient Age:    72 years     BP:           107/74 mmHg Patient Gender: M            HR:           70 bpm. Exam Location:  Inpatient Procedure: 2D Echo, Cardiac Doppler, Color Doppler and Intracardiac            Opacification Agent Indications:    Bacteremia R78.81  History:        Patient has prior history of Echocardiogram examinations, most                 recent 03/31/2021.  Sonographer:    Harriette Bouillon RDCS Referring Phys: [phone] CARLOS MADERA IMPRESSIONS  1. Left ventricular ejection fraction, by estimation, is approximately 55%. The left ventricle has normal function. The left ventricle has no regional wall motion abnormalities. There is mild left ventricular hypertrophy. Left ventricular diastolic parameters are consistent with Grade I diastolic dysfunction (impaired relaxation).  2. Right ventricular systolic function is normal. The right ventricular size is normal. Tricuspid regurgitation signal is inadequate  for assessing PA pressure.  3. Left atrial size was mildly dilated.  4. Right atrial size was mildly dilated.  5. The mitral valve is grossly normal. Trivial mitral valve regurgitation.  6. The aortic valve is tricuspid. Aortic valve regurgitation is not visualized.  7. The inferior vena cava is normal in size with <50% respiratory variability, suggesting right atrial pressure of 8 mmHg.  8. No obvious valvular vegetations. Comparison(s): Prior images reviewed side by side. LVEF normal at approximately 55%. FINDINGS  Left Ventricle: Left ventricular ejection fraction, by estimation, is 55%. The left ventricle has normal function. The left ventricle has no regional wall motion abnormalities. Definity contrast agent was given IV to delineate the left ventricular endocardial borders. The left ventricular internal cavity size was normal in size. There is mild left ventricular hypertrophy. Abnormal (paradoxical) septal motion, consistent with RV pacemaker. Left ventricular diastolic parameters are consistent with Grade I diastolic dysfunction (impaired relaxation). Right Ventricle: The right ventricular size is normal. No increase in right ventricular wall thickness. Right ventricular systolic function is normal. Tricuspid regurgitation signal is inadequate for assessing PA pressure. Left Atrium: Left atrial size was mildly dilated. Right Atrium: Right atrial size was mildly dilated. Pericardium: Trivial pericardial effusion is present. The pericardial effusion is posterior to the left ventricle. Presence of epicardial fat layer. Mitral Valve: The mitral valve is grossly normal. Mild mitral annular calcification. Trivial mitral valve regurgitation. Tricuspid Valve: The tricuspid valve is grossly normal. Tricuspid valve regurgitation is trivial. Aortic Valve: The aortic valve is tricuspid. There is mild aortic valve annular calcification. Aortic valve regurgitation is not visualized. Pulmonic Valve: The pulmonic valve was  not well visualized. Pulmonic valve regurgitation is trivial. Aorta: The aortic root is normal in size and structure. Venous: The inferior vena cava is normal in size with less than 50% respiratory variability, suggesting right atrial pressure of 8 mmHg. IAS/Shunts: No atrial level shunt detected by color flow Doppler. Additional Comments: A device lead is visualized.  LEFT VENTRICLE PLAX 2D LVIDd:         6.20 cm LVIDs:         4.60 cm LV PW:

## 2023-08-02 NOTE — Progress Notes (Addendum)
Rounding Note    Patient Name: Nathan Miller Date of Encounter: 08/02/2023  [facility] Cardiologist: Rollene Rotunda, MD   Subjective   Reports SOB back to baseline  Inpatient Medications    Scheduled Meds:  arformoterol  15 mcg Nebulization BID   aspirin EC  81 mg Oral Daily   atorvastatin  80 mg Oral Daily   budesonide (PULMICORT) nebulizer solution  0.5 mg Nebulization BID   carvedilol  25 mg Oral BID WC   dextromethorphan-guaiFENesin  1 tablet Oral BID   doxycycline  100 mg Oral Q12H   enoxaparin (LOVENOX) injection  60 mg Subcutaneous Q24H   furosemide  40 mg Intravenous Q12H   insulin aspart  0-15 Units Subcutaneous TID WC   insulin aspart  0-5 Units Subcutaneous QHS   insulin glargine-yfgn  7 Units Subcutaneous QHS   predniSONE  50 mg Oral Q breakfast   sodium chloride flush  3 mL Intravenous Q12H   Continuous Infusions:  sodium chloride     PRN Meds: sodium chloride, acetaminophen **OR** acetaminophen, ipratropium-albuterol, ondansetron **OR** ondansetron (ZOFRAN) IV, sodium chloride flush   Vital Signs    Vitals:   08/01/23 2013 08/02/23 0444 08/02/23 0457 08/02/23 0707  BP:  (!) 127/54    Pulse:  (!) 50    Resp:  15    Temp:  97.6 F (36.4 C)    TempSrc:  Oral    SpO2: 95% 93%  94%  Weight:   121.3 kg   Height:        Intake/Output Summary (Last 24 hours) at 08/02/2023 0931 Last data filed at 08/02/2023 0900 Gross per 24 hour  Intake 480 ml  Output 1500 ml  Net -1020 ml      08/02/2023    4:57 AM 08/01/2023    4:51 AM 07/30/2023   10:26 AM  Last 3 Weights  Weight (lbs) 267 lb 6.7 oz 268 lb 1.3 oz 270 lb  Weight (kg) 121.3 kg 121.6 kg 122.471 kg      Telemetry    V paced - Personally Reviewed  ECG    N/a - Personally Reviewed  Physical Exam   GEN: No acute distress.   Neck: No JVD Cardiac: RRR, no murmurs, rubs, or gallops.  Respiratory: Clear to auscultation bilaterally. GI: Soft, nontender, non-distended  MS: 1+ LLE  edema, right leg is prosthetic Neuro:  Nonfocal  Psych: Normal affect   Labs    High Sensitivity Troponin:   Recent Labs  Lab 07/30/23 1110 07/30/23 1354  TROPONINIHS 12 11     Chemistry Recent Labs  Lab 07/31/23 0418 08/01/23 0459 08/02/23 0442  NA 127* 129* 129*  K 4.0 3.7 3.3*  CL 81* 84* 82*  CO2 33* 39* 37*  GLUCOSE 190* 153* 132*  BUN 12 15 19   CREATININE 0.86 0.80 0.81  CALCIUM 8.7* 8.5* 8.5*  MG  --   --  1.9  GFRNONAA >60 >60 >60  ANIONGAP 13 6 10     Lipids No results for input(s): "CHOL", "TRIG", "HDL", "LABVLDL", "LDLCALC", "CHOLHDL" in the last 168 hours.  Hematology Recent Labs  Lab 07/30/23 1110 07/31/23 0418  WBC 7.7 6.4  RBC 4.40 4.46  HGB 12.5* 12.6*  HCT 39.6 39.7  MCV 90.0 89.0  MCH 28.4 28.3  MCHC 31.6 31.7  RDW 16.3* 16.0*  PLT 274 290   Thyroid  Recent Labs  Lab 07/30/23 1110  TSH 3.517    BNP Recent Labs  Lab  07/30/23 1110  BNP 712.0*    DDimer No results for input(s): "DDIMER" in the last 168 hours.   Radiology    [place] EKG SITE RITE  Result Date: 08/01/2023 If Site Rite image not attached, placement could not be confirmed due to current cardiac rhythm.   Cardiac Studies    Patient Profile     Nathan Miller is a 72 y.o. male with a hx of CAD (s/p NSTEMI in 2001 with BMSx2 to RCA), chronic HFpEF, COPD, Type II DM, CHB (s/p [employer] PPM placement), HTN, HLD and PAD (s/p right BKA) who is being seen 08/01/2023 for the evaluation of acute CHF exacerbation at the request of Dr. Arbutus Leas.    Assessment & Plan    1.Acute on chronic HFpEF - 07/2023 echo: LVE 55%, grade I dd, normal RV - BNP 712, CXR no clear acute process - I/Os incomplete, weight 270-->267 lbs since admission. Reds vest yesterday 38% - he is on IV lasix 40mg  bid, renal function is stable. Hyponatremia on admission improving with diuresis.   - notes mention medication noncompliance, he and his wife report strict adherence to me this morning including diuretic.  We will change his home toresmide to 20mg  bid.  - start jardiance 10mg  daily.  - appears near euvolemic, symptoms much improved. He is adamant about going home to day which is reasonable. D/c on torsemide 20mg  bid.     2.CAD - s/p NSTEMI in 2001 with BMSx2 to RCA  - no acute issues this admission   3. Complete heart block with pacemaker - no acute issues this admission  4. COPD exacerbation - per primary team - on 2L [place] chronically at home  [facility] for d/c, we will sign off inpatient care. D/c on torsemide 20mg  bid, we have also added jardiance 10mg  daily, would add KCl daily as well  For questions or updates, please contact [facility] Please consult [email or web address] for contact info under        Signed, Dina Rich, MD  08/02/2023, 9:31 AM

## 2023-08-06 NOTE — Progress Notes (Unsigned)
Cardiology Office Note:   Date:  08/08/2023  ID:  Nathan Miller, DOB Aug 22, 1951, MRN [id number] PCP: [facility] Providers Cardiologist:  Rollene Rotunda, MD Electrophysiologist:  Maurice Small, MD {  History of Present Illness:   Nathan Miller is a 72 y.o. male evaluation of CAD and diastolic dysfunction.  He was in the hospital in Sept 2024 with acute on chronic diastolic HF.  I reviewed these records for this visit.    He reports that he noticed before the hospitalization that his oxygen requirement was going from 2 to 4 L.  He had increased edema in retrospect.  He was short of breath getting around on his prosthesis.   In retrospect he noticed that he is O2 tubing had been kinked but he does think he was volume overloaded.  He is down about 12 to 14 pounds he says.  He is breathing better.  He gets around on his prosthesis.  He denies any chest pressure, neck or arm discomfort.  He had no palpitations, presyncope or syncope.  He denies any new PND or orthopnea although he chronically sleeps in a recliner.  He has been taking the torsemide 40 mg twice daily.  He is watching his salt and fluid intake.   ROS: As stated in the HPI and negative for all other systems.  Studies Reviewed:    EKG:   NA  Risk Assessment/Calculations:         Physical Exam:   VS:  BP (!) 148/70   Pulse 72   Ht 5\' 8"  (1.727 m)   Wt 257 lb (116.6 kg)   BMI 39.08 kg/m    Wt Readings from Last 3 Encounters:  08/08/23 257 lb (116.6 kg)  08/02/23 267 lb 6.7 oz (121.3 kg)  05/04/23 270 lb (122.5 kg)     GEN: Well nourished, well developed in no acute distress NECK: No JVD; No carotid bruits CARDIAC: RRR, no murmurs, rubs, gallops RESPIRATORY:  Clear to auscultation without rales, wheezing or rhonchi  ABDOMEN: Soft, non-tender, non-distended, umbilical hernia EXTREMITIES: Left leg lower extremity swelling with venous stasis changes ; No deformity,  status post right leg amputation  ASSESSMENT AND PLAN:   Acute on chronic systolic and diastolic HF: Seems to be euvolemic.  I will check a basic metabolic profile today.  Of note he was hyponatremic again we will check his blood work.   PAD (peripheral artery disease) (HCC):   He walks on his prosthesis.  No change in therapy.  He will continue risk reduction.   Cardiac pacemaker in situ:   I reviewed the office note from Dr. Nelly Laurence in July.  The patient is up to date with follow up.    DM: A1c is 5.8.  No change in therapy.  HTN: Blood pressure is mildly elevated but typically well-controlled.  He can keep a blood pressure diary at home.   CAD:   He has no active ischemia.  He will continue with risk reduction.       Follow up with me in six months.   Signed, Rollene Rotunda, MD

## 2023-08-08 ENCOUNTER — Ambulatory Visit: Payer: PPO | Admitting: Cardiology

## 2023-08-08 ENCOUNTER — Encounter: Payer: Self-pay | Admitting: Cardiology

## 2023-08-08 ENCOUNTER — Other Ambulatory Visit: Payer: Self-pay | Admitting: *Deleted

## 2023-08-08 VITALS — BP 148/70 | HR 72 | Ht 68.0 in | Wt 257.0 lb

## 2023-08-08 DIAGNOSIS — Z79899 Other long term (current) drug therapy: Secondary | ICD-10-CM

## 2023-08-08 DIAGNOSIS — I5032 Chronic diastolic (congestive) heart failure: Secondary | ICD-10-CM

## 2023-08-08 DIAGNOSIS — I2510 Atherosclerotic heart disease of native coronary artery without angina pectoris: Secondary | ICD-10-CM

## 2023-08-08 DIAGNOSIS — Z9861 Coronary angioplasty status: Secondary | ICD-10-CM

## 2023-08-08 DIAGNOSIS — E785 Hyperlipidemia, unspecified: Secondary | ICD-10-CM

## 2023-08-08 DIAGNOSIS — I5033 Acute on chronic diastolic (congestive) heart failure: Secondary | ICD-10-CM

## 2023-08-08 MED ORDER — TORSEMIDE 20 MG PO TABS: 20.0000 mg | ORAL_TABLET | Freq: Two times a day (BID) | ORAL | 3 refills | Status: DC

## 2023-08-08 NOTE — Patient Instructions (Signed)
Medication Instructions:  The current medical regimen is effective;  continue present plan and medications.  *If you need a refill on your cardiac medications before your next appointment, please call your pharmacy*   Lab Work: Please have blood work as soon as possible.  If not done at [facility] - send to [phone] ATTN: Dr Antoine Poche  If you have labs (blood work) drawn today and your tests are completely normal, you will receive your results only by: MyChart Message (if you have MyChart) OR A paper copy in the mail If you have any lab test that is abnormal or we need to change your treatment, we will call you to review the results.  Follow-Up: At [facility], you and your health needs are our priority.  As part of our continuing mission to provide you with exceptional heart care, we have created designated Provider Care Teams.  These Care Teams include your primary Cardiologist (physician) and Advanced Practice Providers (APPs -  Physician Assistants and Nurse Practitioners) who all work together to provide you with the care you need, when you need it.  We recommend signing up for the patient portal called "MyChart".  Sign up information is provided on this After Visit Summary.  MyChart is used to connect with patients for Virtual Visits (Telemedicine).  Patients are able to view lab/test results, encounter notes, upcoming appointments, etc.  Non-urgent messages can be sent to your provider as well.   To learn more about what you can do with MyChart, go to [email or web address].    Your next appointment:   6 month(s)  Provider:   Rollene Rotunda, MD

## 2023-08-10 ENCOUNTER — Other Ambulatory Visit (HOSPITAL_COMMUNITY)
Admission: RE | Admit: 2023-08-10 | Discharge: 2023-08-10 | Disposition: A | Discharge status: Home / Self Care | Payer: PPO | Source: Ambulatory Visit | Attending: Cardiology | Admitting: Cardiology

## 2023-08-10 ENCOUNTER — Encounter: Payer: Self-pay | Admitting: *Deleted

## 2023-08-10 DIAGNOSIS — I5033 Acute on chronic diastolic (congestive) heart failure: Secondary | ICD-10-CM | POA: Insufficient documentation

## 2023-08-10 DIAGNOSIS — Z79899 Other long term (current) drug therapy: Secondary | ICD-10-CM | POA: Insufficient documentation

## 2023-08-10 LAB — BASIC METABOLIC PANEL
Anion gap: 11 (ref 5–15)
BUN: 12 mg/dL (ref 8–23)
CO2: 36 mmol/L — ABNORMAL HIGH (ref 22–32)
Calcium: 8.9 mg/dL (ref 8.9–10.3)
Chloride: 86 mmol/L — ABNORMAL LOW (ref 98–111)
Creatinine, Ser: 0.97 mg/dL (ref 0.61–1.24)
GFR, Estimated: >60 mL/min (ref >60)
Glucose, Bld: 108 mg/dL — ABNORMAL HIGH (ref 70–99)
Potassium: 4 mmol/L (ref 3.5–5.1)
Sodium: 133 mmol/L — ABNORMAL LOW (ref 135–145)

## 2023-09-26 ENCOUNTER — Ambulatory Visit: Payer: PPO

## 2023-09-26 DIAGNOSIS — I442 Atrioventricular block, complete: Secondary | ICD-10-CM

## 2023-10-03 ENCOUNTER — Ambulatory Visit: Payer: PPO

## 2023-10-03 DIAGNOSIS — I442 Atrioventricular block, complete: Secondary | ICD-10-CM | POA: Diagnosis not present

## 2023-10-03 LAB — CUP PACEART REMOTE DEVICE CHECK
Battery Remaining Longevity: 31 mo
Battery Voltage: 2.94 V
Brady Statistic RV Percent Paced: 99.8 %
Date Time Interrogation Session: 20241203164314
Implantable Lead Connection Status: 753985
Implantable Lead Implant Date: 20160325
Implantable Lead Location: 753860
Implantable Lead Model: 5076
Implantable Pulse Generator Implant Date: 20160325
Lead Channel Impedance Value: 475 Ohm
Lead Channel Impedance Value: 494 Ohm
Lead Channel Pacing Threshold Amplitude: 0.625 V
Lead Channel Pacing Threshold Pulse Width: 0.4 ms
Lead Channel Sensing Intrinsic Amplitude: 19.75 mV
Lead Channel Sensing Intrinsic Amplitude: 19.75 mV
Lead Channel Setting Pacing Amplitude: 2.5 V
Lead Channel Setting Pacing Pulse Width: 0.4 ms
Lead Channel Setting Sensing Sensitivity: 0.9 mV
Zone Setting Status: 755011

## 2024-01-02 ENCOUNTER — Ambulatory Visit (INDEPENDENT_AMBULATORY_CARE_PROVIDER_SITE_OTHER): Payer: PPO

## 2024-01-02 DIAGNOSIS — I442 Atrioventricular block, complete: Secondary | ICD-10-CM

## 2024-01-07 LAB — CUP PACEART REMOTE DEVICE CHECK
Battery Remaining Longevity: 28 mo
Battery Voltage: 2.93 V
Brady Statistic RV Percent Paced: 99.8 %
Date Time Interrogation Session: 20250307140525
Implantable Lead Connection Status: 753985
Implantable Lead Implant Date: 20160325
Implantable Lead Location: 753860
Implantable Lead Model: 5076
Implantable Pulse Generator Implant Date: 20160325
Lead Channel Impedance Value: 475 Ohm
Lead Channel Impedance Value: 513 Ohm
Lead Channel Pacing Threshold Amplitude: 0.625 V
Lead Channel Pacing Threshold Pulse Width: 0.4 ms
Lead Channel Sensing Intrinsic Amplitude: 6.5 mV
Lead Channel Sensing Intrinsic Amplitude: 6.5 mV
Lead Channel Setting Pacing Amplitude: 2.5 V
Lead Channel Setting Pacing Pulse Width: 0.4 ms
Lead Channel Setting Sensing Sensitivity: 0.9 mV
Zone Setting Status: 755011

## 2024-02-19 NOTE — Progress Notes (Signed)
 Remote pacemaker transmission.

## 2024-02-19 NOTE — Progress Notes (Unsigned)
  Cardiology Office Note:   Date:  02/20/2024  ID:  CABE LASHLEY, DOB January 05, 1951, MRN [id number] PCP: [facility] Providers Cardiologist:  Eilleen Grates, MD Electrophysiologist:  Efraim Grange, MD {  History of Present Illness:   Nathan Miller is a 73 y.o. male male evaluation of CAD and diastolic dysfunction.  He was in the hospital in Sept 2024 with acute on chronic diastolic HF.  Since I last saw him he has done pretty well. The patient denies any new symptoms such as chest discomfort, neck or arm discomfort. There has been no new shortness of breath, PND or orthopnea. There have been no reported palpitations, presyncope or syncope.   He is keeping his volume down.  He is watching his salt and fluid.  He gets in a wheelchair and has a prosthesis.   Of note he stopped taking his Jardiance  because it made him nauseated.    ROS: As stated in the HPI and negative for all other systems.  Studies Reviewed:    EKG:   NA  Risk Assessment/Calculations:              Physical Exam:   VS:  BP 130/70   Pulse 60   Ht 5\' 8"  (1.727 m)   Wt 258 lb (117 kg)   BMI 39.23 kg/m    Wt Readings from Last 3 Encounters:  02/20/24 258 lb (117 kg)  08/08/23 257 lb (116.6 kg)  08/02/23 267 lb 6.7 oz (121.3 kg)     GEN: Well nourished, well developed in no acute distress NECK: No JVD; No carotid bruits CARDIAC: RRR, no murmurs, rubs, gallops RESPIRATORY:  Clear to auscultation without rales, wheezing or rhonchi  ABDOMEN: Soft, non-tender, non-distended EXTREMITIES:  No edema; No deformity   ASSESSMENT AND PLAN:   Acute on chronic systolic and diastolic HF: He seems to be euvolemic.  No change in therapy.   Cardiac pacemaker in situ:   I reviewed the most recent device interrogation from March 10th for this visit.     DM: A1c is 5.7.  No change in therapy.   HTN: Blood pressure is at target.  No change in therapy.   CAD:  The patient  has no new sypmtoms.  No further cardiovascular testing is indicated.  We will continue with aggressive risk reduction and meds as listed.        Follow up with me in 12 months.   Signed, Eilleen Grates, MD

## 2024-02-19 NOTE — Addendum Note (Signed)
 Addended by: Lott Rouleau A on: 02/19/2024 01:49 PM   Modules accepted: Orders

## 2024-02-20 ENCOUNTER — Encounter: Payer: Self-pay | Admitting: Cardiology

## 2024-02-20 ENCOUNTER — Ambulatory Visit: Payer: PPO | Admitting: Cardiology

## 2024-02-20 VITALS — BP 130/70 | HR 60 | Ht 68.0 in | Wt 258.0 lb

## 2024-02-20 DIAGNOSIS — I2510 Atherosclerotic heart disease of native coronary artery without angina pectoris: Secondary | ICD-10-CM | POA: Diagnosis not present

## 2024-02-20 NOTE — Patient Instructions (Signed)
 Medication Instructions:  The current medical regimen is effective;  continue present plan and medications.  *If you need a refill on your cardiac medications before your next appointment, please call your pharmacy*  Follow-Up: At [facility], you and your health needs are our priority.  As part of our continuing mission to provide you with exceptional heart care, our providers are all part of one team.  This team includes your primary Cardiologist (physician) and Advanced Practice Providers or APPs (Physician Assistants and Nurse Practitioners) who all work together to provide you with the care you need, when you need it.  Your next appointment:   1 year(s)  Provider:   Rollene Rotunda, MD    We recommend signing up for the patient portal called "MyChart".  Sign up information is provided on this After Visit Summary.  MyChart is used to connect with patients for Virtual Visits (Telemedicine).  Patients are able to view lab/test results, encounter notes, upcoming appointments, etc.  Non-urgent messages can be sent to your provider as well.   To learn more about what you can do with MyChart, go to [email or web address].

## 2024-03-28 ENCOUNTER — Encounter (HOSPITAL_COMMUNITY): Payer: Self-pay

## 2024-03-28 ENCOUNTER — Emergency Department (HOSPITAL_COMMUNITY)

## 2024-03-28 ENCOUNTER — Inpatient Hospital Stay (HOSPITAL_COMMUNITY)
Admission: EM | Admit: 2024-03-28 | Discharge: 2024-04-01 | DRG: 291 | Disposition: A | Discharge status: Home Health | Attending: Internal Medicine | Admitting: Internal Medicine

## 2024-03-28 ENCOUNTER — Other Ambulatory Visit: Payer: Self-pay

## 2024-03-28 DIAGNOSIS — I509 Heart failure, unspecified: Secondary | ICD-10-CM

## 2024-03-28 DIAGNOSIS — E119 Type 2 diabetes mellitus without complications: Secondary | ICD-10-CM

## 2024-03-28 DIAGNOSIS — Z950 Presence of cardiac pacemaker: Secondary | ICD-10-CM | POA: Diagnosis not present

## 2024-03-28 DIAGNOSIS — I252 Old myocardial infarction: Secondary | ICD-10-CM | POA: Diagnosis not present

## 2024-03-28 DIAGNOSIS — I2510 Atherosclerotic heart disease of native coronary artery without angina pectoris: Secondary | ICD-10-CM

## 2024-03-28 DIAGNOSIS — J441 Chronic obstructive pulmonary disease with (acute) exacerbation: Secondary | ICD-10-CM | POA: Diagnosis present

## 2024-03-28 DIAGNOSIS — I5033 Acute on chronic diastolic (congestive) heart failure: Secondary | ICD-10-CM | POA: Diagnosis present

## 2024-03-28 DIAGNOSIS — J181 Lobar pneumonia, unspecified organism: Secondary | ICD-10-CM | POA: Diagnosis present

## 2024-03-28 DIAGNOSIS — Z7984 Long term (current) use of oral hypoglycemic drugs: Secondary | ICD-10-CM

## 2024-03-28 DIAGNOSIS — Z79899 Other long term (current) drug therapy: Secondary | ICD-10-CM | POA: Diagnosis not present

## 2024-03-28 DIAGNOSIS — R001 Bradycardia, unspecified: Secondary | ICD-10-CM | POA: Diagnosis present

## 2024-03-28 DIAGNOSIS — Z6841 Body Mass Index (BMI) 40.0 and over, adult: Secondary | ICD-10-CM | POA: Diagnosis not present

## 2024-03-28 DIAGNOSIS — Z955 Presence of coronary angioplasty implant and graft: Secondary | ICD-10-CM | POA: Diagnosis not present

## 2024-03-28 DIAGNOSIS — J440 Chronic obstructive pulmonary disease with acute lower respiratory infection: Secondary | ICD-10-CM | POA: Diagnosis present

## 2024-03-28 DIAGNOSIS — E785 Hyperlipidemia, unspecified: Secondary | ICD-10-CM | POA: Diagnosis present

## 2024-03-28 DIAGNOSIS — J9621 Acute and chronic respiratory failure with hypoxia: Secondary | ICD-10-CM | POA: Diagnosis present

## 2024-03-28 DIAGNOSIS — E66813 Obesity, class 3: Secondary | ICD-10-CM | POA: Diagnosis present

## 2024-03-28 DIAGNOSIS — Z8249 Family history of ischemic heart disease and other diseases of the circulatory system: Secondary | ICD-10-CM | POA: Diagnosis not present

## 2024-03-28 DIAGNOSIS — J471 Bronchiectasis with (acute) exacerbation: Secondary | ICD-10-CM | POA: Diagnosis present

## 2024-03-28 DIAGNOSIS — Z89511 Acquired absence of right leg below knee: Secondary | ICD-10-CM

## 2024-03-28 DIAGNOSIS — I110 Hypertensive heart disease with heart failure: Principal | ICD-10-CM | POA: Diagnosis present

## 2024-03-28 DIAGNOSIS — Z7982 Long term (current) use of aspirin: Secondary | ICD-10-CM

## 2024-03-28 DIAGNOSIS — Z825 Family history of asthma and other chronic lower respiratory diseases: Secondary | ICD-10-CM

## 2024-03-28 DIAGNOSIS — E1165 Type 2 diabetes mellitus with hyperglycemia: Secondary | ICD-10-CM | POA: Diagnosis present

## 2024-03-28 DIAGNOSIS — Z720 Tobacco use: Secondary | ICD-10-CM

## 2024-03-28 DIAGNOSIS — J9601 Acute respiratory failure with hypoxia: Secondary | ICD-10-CM | POA: Diagnosis not present

## 2024-03-28 DIAGNOSIS — J470 Bronchiectasis with acute lower respiratory infection: Secondary | ICD-10-CM | POA: Diagnosis present

## 2024-03-28 DIAGNOSIS — E1151 Type 2 diabetes mellitus with diabetic peripheral angiopathy without gangrene: Secondary | ICD-10-CM | POA: Diagnosis present

## 2024-03-28 DIAGNOSIS — E871 Hypo-osmolality and hyponatremia: Secondary | ICD-10-CM | POA: Diagnosis present

## 2024-03-28 DIAGNOSIS — J189 Pneumonia, unspecified organism: Secondary | ICD-10-CM | POA: Insufficient documentation

## 2024-03-28 LAB — BASIC METABOLIC PANEL WITH GFR
Anion gap: 12 (ref 5–15)
BUN: 11 mg/dL (ref 8–23)
CO2: 33 mmol/L — ABNORMAL HIGH (ref 22–32)
Calcium: 8.7 mg/dL — ABNORMAL LOW (ref 8.9–10.3)
Chloride: 85 mmol/L — ABNORMAL LOW (ref 98–111)
Creatinine, Ser: 0.76 mg/dL (ref 0.61–1.24)
GFR, Estimated: >60 mL/min (ref >60)
Glucose, Bld: 119 mg/dL — ABNORMAL HIGH (ref 70–99)
Potassium: 3.4 mmol/L — ABNORMAL LOW (ref 3.5–5.1)
Sodium: 130 mmol/L — ABNORMAL LOW (ref 135–145)

## 2024-03-28 LAB — CBC WITH DIFFERENTIAL/PLATELET
Abs Immature Granulocytes: 0.14 10*3/uL — ABNORMAL HIGH (ref 0.00–0.07)
Basophils Absolute: 0.1 10*3/uL (ref 0.0–0.1)
Basophils Relative: 0 %
Eosinophils Absolute: 0 10*3/uL (ref 0.0–0.5)
Eosinophils Relative: 0 %
HCT: 35.1 % — ABNORMAL LOW (ref 39.0–52.0)
Hemoglobin: 11.4 g/dL — ABNORMAL LOW (ref 13.0–17.0)
Immature Granulocytes: 1 %
Lymphocytes Relative: 4 %
Lymphs Abs: 0.8 10*3/uL (ref 0.7–4.0)
MCH: 29.5 pg (ref 26.0–34.0)
MCHC: 32.5 g/dL (ref 30.0–36.0)
MCV: 90.7 fL (ref 80.0–100.0)
Monocytes Absolute: 1.4 10*3/uL — ABNORMAL HIGH (ref 0.1–1.0)
Monocytes Relative: 7 %
Neutro Abs: 16.7 10*3/uL — ABNORMAL HIGH (ref 1.7–7.7)
Neutrophils Relative %: 88 %
Platelets: 255 10*3/uL (ref 150–400)
RBC: 3.87 MIL/uL — ABNORMAL LOW (ref 4.22–5.81)
RDW: 15.1 % (ref 11.5–15.5)
WBC: 19.1 10*3/uL — ABNORMAL HIGH (ref 4.0–10.5)
nRBC: 0 % (ref 0.0–0.2)

## 2024-03-28 LAB — RESP PANEL BY RT-PCR (RSV, FLU A&B, COVID)  RVPGX2
Influenza A by PCR: NEGATIVE
Influenza B by PCR: NEGATIVE
Resp Syncytial Virus by PCR: NEGATIVE
SARS Coronavirus 2 by RT PCR: NEGATIVE

## 2024-03-28 LAB — BRAIN NATRIURETIC PEPTIDE: B Natriuretic Peptide: 671 pg/mL — ABNORMAL HIGH (ref 0.0–100.0)

## 2024-03-28 LAB — TROPONIN I (HIGH SENSITIVITY)
Troponin I (High Sensitivity): 19 ng/L — ABNORMAL HIGH (ref <18)
Troponin I (High Sensitivity): 24 ng/L — ABNORMAL HIGH (ref <18)

## 2024-03-28 LAB — GLUCOSE, CAPILLARY: Glucose-Capillary: 273 mg/dL — ABNORMAL HIGH (ref 70–99)

## 2024-03-28 LAB — LACTIC ACID, PLASMA: Lactic Acid, Venous: 0.9 mmol/L (ref 0.5–1.9)

## 2024-03-28 MED ORDER — ATORVASTATIN CALCIUM 40 MG PO TABS
80.0000 mg | ORAL_TABLET | Freq: Every day | ORAL | Status: DC
  Administered 2024-03-29 – 2024-03-31 (×3): 80 mg via ORAL
  Filled 2024-03-28 (×4): qty 2

## 2024-03-28 MED ORDER — UMECLIDINIUM BROMIDE 62.5 MCG/ACT IN AEPB: 1.0000 | INHALATION_SPRAY | Freq: Every day | RESPIRATORY_TRACT | Status: DC

## 2024-03-28 MED ORDER — ONDANSETRON HCL 4 MG PO TABS: 4.0000 mg | ORAL_TABLET | Freq: Four times a day (QID) | ORAL | Status: DC | PRN

## 2024-03-28 MED ORDER — INSULIN ASPART 100 UNIT/ML IJ SOLN
0.0000 [IU] | Freq: Every day | INTRAMUSCULAR | Status: DC
  Administered 2024-03-28: 3 [IU] via SUBCUTANEOUS
  Administered 2024-03-31: 2 [IU] via SUBCUTANEOUS

## 2024-03-28 MED ORDER — ENOXAPARIN SODIUM 60 MG/0.6ML IJ SOSY
55.0000 mg | PREFILLED_SYRINGE | INTRAMUSCULAR | Status: DC
  Administered 2024-03-28: 55 mg via SUBCUTANEOUS
  Filled 2024-03-28: qty 0.6

## 2024-03-28 MED ORDER — FUROSEMIDE 10 MG/ML IJ SOLN
40.0000 mg | Freq: Once | INTRAMUSCULAR | Status: AC
  Administered 2024-03-28: 40 mg via INTRAVENOUS
  Filled 2024-03-28: qty 4

## 2024-03-28 MED ORDER — FUROSEMIDE 10 MG/ML IJ SOLN
60.0000 mg | Freq: Two times a day (BID) | INTRAMUSCULAR | Status: DC
  Administered 2024-03-29 – 2024-03-31 (×5): 60 mg via INTRAVENOUS
  Filled 2024-03-28 (×5): qty 6

## 2024-03-28 MED ORDER — IPRATROPIUM-ALBUTEROL 0.5-2.5 (3) MG/3ML IN SOLN
3.0000 mL | Freq: Once | RESPIRATORY_TRACT | Status: AC
  Administered 2024-03-28: 3 mL via RESPIRATORY_TRACT
  Filled 2024-03-28: qty 3

## 2024-03-28 MED ORDER — ACETAMINOPHEN 325 MG PO TABS
650.0000 mg | ORAL_TABLET | Freq: Once | ORAL | Status: AC
  Administered 2024-03-28: 650 mg via ORAL
  Filled 2024-03-28: qty 2

## 2024-03-28 MED ORDER — SODIUM CHLORIDE 0.9 % IV SOLN
500.0000 mg | INTRAVENOUS | Status: DC
  Administered 2024-03-29 – 2024-03-30 (×2): 500 mg via INTRAVENOUS
  Filled 2024-03-28 (×2): qty 5

## 2024-03-28 MED ORDER — METHYLPREDNISOLONE SODIUM SUCC 125 MG IJ SOLR
60.0000 mg | Freq: Two times a day (BID) | INTRAMUSCULAR | Status: AC
  Administered 2024-03-28 – 2024-03-29 (×2): 60 mg via INTRAVENOUS
  Filled 2024-03-28 (×2): qty 2

## 2024-03-28 MED ORDER — IOHEXOL 350 MG/ML SOLN
75.0000 mL | Freq: Once | INTRAVENOUS | Status: AC | PRN
  Administered 2024-03-28: 75 mL via INTRAVENOUS

## 2024-03-28 MED ORDER — PREDNISONE 20 MG PO TABS
40.0000 mg | ORAL_TABLET | Freq: Every day | ORAL | Status: DC
Start: 2024-03-30 — End: 2024-03-29

## 2024-03-28 MED ORDER — ALBUTEROL SULFATE (2.5 MG/3ML) 0.083% IN NEBU: 3.0000 mL | INHALATION_SOLUTION | Freq: Four times a day (QID) | RESPIRATORY_TRACT | Status: DC | PRN

## 2024-03-28 MED ORDER — POTASSIUM CHLORIDE CRYS ER 20 MEQ PO TBCR
40.0000 meq | EXTENDED_RELEASE_TABLET | Freq: Two times a day (BID) | ORAL | Status: DC
  Administered 2024-03-28 – 2024-04-01 (×8): 40 meq via ORAL
  Filled 2024-03-28 (×8): qty 2

## 2024-03-28 MED ORDER — SODIUM CHLORIDE 0.9 % IV SOLN
500.0000 mg | Freq: Once | INTRAVENOUS | Status: AC
  Administered 2024-03-28: 500 mg via INTRAVENOUS
  Filled 2024-03-28: qty 5

## 2024-03-28 MED ORDER — METHYLPREDNISOLONE SODIUM SUCC 125 MG IJ SOLR
125.0000 mg | Freq: Once | INTRAMUSCULAR | Status: AC
  Administered 2024-03-28: 125 mg via INTRAVENOUS
  Filled 2024-03-28: qty 2

## 2024-03-28 MED ORDER — SODIUM CHLORIDE 0.9 % IV SOLN
2.0000 g | INTRAVENOUS | Status: DC
  Administered 2024-03-29 – 2024-03-31 (×3): 2 g via INTRAVENOUS
  Filled 2024-03-28 (×3): qty 20

## 2024-03-28 MED ORDER — CARVEDILOL 12.5 MG PO TABS: 25.0000 mg | ORAL_TABLET | Freq: Two times a day (BID) | ORAL | Status: DC

## 2024-03-28 MED ORDER — INSULIN ASPART 100 UNIT/ML IJ SOLN
0.0000 [IU] | Freq: Three times a day (TID) | INTRAMUSCULAR | Status: DC
  Administered 2024-03-29: 3 [IU] via SUBCUTANEOUS
  Administered 2024-03-29: 11 [IU] via SUBCUTANEOUS
  Administered 2024-03-30: 7 [IU] via SUBCUTANEOUS
  Administered 2024-03-30: 4 [IU] via SUBCUTANEOUS
  Administered 2024-03-30: 11 [IU] via SUBCUTANEOUS
  Administered 2024-03-31: 4 [IU] via SUBCUTANEOUS
  Administered 2024-03-31: 11 [IU] via SUBCUTANEOUS
  Administered 2024-03-31: 4 [IU] via SUBCUTANEOUS
  Administered 2024-04-01: 3 [IU] via SUBCUTANEOUS

## 2024-03-28 MED ORDER — SODIUM CHLORIDE 0.9 % IV SOLN
1.0000 g | Freq: Once | INTRAVENOUS | Status: AC
  Administered 2024-03-28: 1 g via INTRAVENOUS
  Filled 2024-03-28: qty 10

## 2024-03-28 MED ORDER — MAGNESIUM SULFATE 2 GM/50ML IV SOLN
2.0000 g | Freq: Once | INTRAVENOUS | Status: AC
  Administered 2024-03-28: 2 g via INTRAVENOUS
  Filled 2024-03-28: qty 50

## 2024-03-28 MED ORDER — ONDANSETRON HCL 4 MG/2ML IJ SOLN: 4.0000 mg | Freq: Four times a day (QID) | INTRAMUSCULAR | Status: DC | PRN

## 2024-03-28 MED ORDER — POTASSIUM CHLORIDE CRYS ER 20 MEQ PO TBCR
40.0000 meq | EXTENDED_RELEASE_TABLET | Freq: Once | ORAL | Status: AC
  Administered 2024-03-28: 40 meq via ORAL
  Filled 2024-03-28: qty 2

## 2024-03-28 MED ORDER — TIOTROPIUM BROMIDE MONOHYDRATE 2.5 MCG/ACT IN AERS: 2.0000 | INHALATION_SPRAY | Freq: Every day | RESPIRATORY_TRACT | Status: DC

## 2024-03-28 NOTE — ED Triage Notes (Signed)
 Pt reports a dry hacking cough since yesterday and shortness of breath today.

## 2024-03-28 NOTE — Progress Notes (Signed)
 PHARMACIST - PHYSICIAN COMMUNICATION  CONCERNING:  Enoxaparin  (Lovenox ) for DVT Prophylaxis   ASSESSMENT: Patient was prescribed enoxaparin  40 mg subcutaneously every 24 hours for VTE prophylaxis.   Body mass index is 39.22 kg/m.  Estimated Creatinine Clearance: 106.7 mL/min (by C-G formula based on SCr of 0.76 mg/dL).  Based on [facility] policy, patient qualifies for enoxaparin  dosing of 0.5 mg per kilogram of total body weight every 24 hours because their body mass index is >30 kg/m2.  PLAN: Pharmacy has adjusted enoxaparin  dose per [facility] policy.  Description: Patient is now receiving enoxaparin  55 mg subcutaneously every 24 hours.  Will M. Alva Jewels, PharmD Clinical Pharmacist 03/28/2024 9:41 PM

## 2024-03-28 NOTE — H&P (Signed)
 History and Physical    Patient: Nathan Miller NGE:[id number] DOB: 03/21/1951 DOA: 03/28/2024 DOS: the patient was seen and examined on 03/28/2024 PCP: [facility]  Patient coming from: Home  Chief Complaint:  Chief Complaint  Patient presents with   Shortness of Breath   HPI: Nathan Miller is a 73 y.o. male with medical history significant of heart failure with preserved EF, coronary artery disease, NSTEMI in 2001 with bare-metal stents to RCA, COPD, diabetes, hypertension, history of right BKA.  Patient seen for worsening shortness of breath over the last day and a half.  He has been having increased dry hacking cough with no sputum production.  In addition, he was unable to lay down to sleep at night last night and had to sleep almost sitting up.  He has been taking his torsemide  and medications correctly.  No fevers, but had some chills this morning and says that his skin feels clammy.  Patient arrived via EMS and had a nebulizer treatment and route, needed to be placed on nonrebreather initially here.  Received steroids, Lasix , antibiotics in the ED  Review of Systems: As mentioned in the history of present illness. All other systems reviewed and are negative. Past Medical History:  Diagnosis Date   (HFpEF) heart failure with preserved ejection fraction (HCC)    CAD (coronary artery disease)    NSTEMI in 2001. Cardiac cath showed: LAD: 40% proximal, LCX: 70% mid, RCA thrombotic occlusion in mid segment. s/p PCI and 2 overlapped BMSs to RCA.    Complete heart block (HCC)    COPD (chronic obstructive pulmonary disease) (HCC)    Diabetes mellitus    Hypertension    Osteomyelitis (HCC)    Presence of permanent cardiac pacemaker    Past Surgical History:  Procedure Laterality Date   AMPUTATION Right 08/03/2021   Procedure: AMPUTATION BELOW KNEE;  Surgeon: Timothy Ford, MD;  Location: [facility] OR;  Service: Orthopedics;  Laterality: Right;   BELOW KNEE LEG  AMPUTATION     BIOPSY  08/07/2021   Procedure: BIOPSY;  Surgeon: Genell Ken, MD;  Location: [facility] ENDOSCOPY;  Service: Gastroenterology;;   BUBBLE STUDY  04/07/2021   Procedure: BUBBLE STUDY;  Surgeon: Euell Herrlich, MD;  Location: [facility] ENDOSCOPY;  Service: Cardiology;;   CARDIAC CATHETERIZATION     COLONOSCOPY N/A 08/07/2021   Procedure: COLONOSCOPY;  Surgeon: Genell Ken, MD;  Location: [facility] ENDOSCOPY;  Service: Gastroenterology;  Laterality: N/A;   CORONARY ANGIOPLASTY     ESOPHAGOGASTRODUODENOSCOPY (EGD) WITH PROPOFOL  N/A 08/07/2021   Procedure: ESOPHAGOGASTRODUODENOSCOPY (EGD) WITH PROPOFOL ;  Surgeon: Genell Ken, MD;  Location: [facility] ENDOSCOPY;  Service: Gastroenterology;  Laterality: N/A;   PERMANENT PACEMAKER INSERTION N/A 01/22/2015   MDT Advisa pacemaker implanted for complete heart block by Dr Nunzio Belch   POLYPECTOMY  08/07/2021   Procedure: POLYPECTOMY;  Surgeon: Genell Ken, MD;  Location: [facility] ENDOSCOPY;  Service: Gastroenterology;;   TEE WITHOUT CARDIOVERSION N/A 04/07/2021   Procedure: TRANSESOPHAGEAL ECHOCARDIOGRAM (TEE);  Surgeon: Euell Herrlich, MD;  Location: [facility] ENDOSCOPY;  Service: Cardiology;  Laterality: N/A;   TEMPORARY PACEMAKER INSERTION N/A 01/21/2015   Procedure: TEMPORARY PACEMAKER INSERTION;  Surgeon: Arleen Lacer, MD;  Location: [facility] CATH LAB;  Service: Cardiovascular;  Laterality: N/A;   Social History:  reports that he quit smoking about 16 years ago. His smoking use included cigarettes. He started smoking about 55 years ago. He has a 12 pack-year smoking history. His smokeless tobacco use includes snuff. He reports  that he does not currently use alcohol after a past usage of about 42.0 standard drinks of alcohol per week. He reports that he does not use drugs.  No Known Allergies  Family History  Problem Relation Age of Onset   Heart disease Father    COPD Father    Heart disease Paternal Grandfather     Prior to Admission medications   Medication Sig  Start Date End Date Taking? Authorizing Provider  acetaminophen  (TYLENOL ) 500 MG tablet Take 500 mg by mouth every 6 (six) hours as needed for mild pain or fever.    [provider]  albuterol  (VENTOLIN  HFA) 108 (90 Base) MCG/ACT inhaler Inhale 2 puffs into the lungs every 6 (six) hours as needed for wheezing or shortness of breath. 07/12/21   [provider]  aspirin  EC 81 MG tablet Take 1 tablet (81 mg total) by mouth daily. 01/25/15   Luevenia Saha, MD  atorvastatin  (LIPITOR ) 80 MG tablet Take 1 tablet (80 mg total) by mouth daily. 05/04/17   Allred, Royston Cornea, MD  carvedilol  (COREG ) 25 MG tablet Take 1 tablet (25 mg total) by mouth 2 (two) times daily. 05/04/17   Allred, Royston Cornea, MD  ferrous sulfate  325 (65 FE) MG tablet Take 325 mg by mouth every other day.    [provider]  metFORMIN  (GLUCOPHAGE ) 500 MG tablet Take 1 tablet (500 mg total) by mouth 2 (two) times daily with a meal. 01/25/15   Short, Tonita Frater, MD  Multiple Vitamins-Minerals (CENTRUM ADULTS) TABS Take 1 tablet by mouth daily.    [provider]  nitroGLYCERIN  (NITROSTAT ) 0.4 MG SL tablet Place 1 tablet (0.4 mg total) under the tongue every 5 (five) minutes as needed for chest pain. 05/04/23   Eilleen Grates, MD  potassium chloride  SA (KLOR-CON  M) 20 MEQ tablet Take 2 tablets (40 mEq total) by mouth daily. 08/02/23   Demaris Fillers, MD  SPIRIVA  RESPIMAT 2.5 MCG/ACT AERS Inhale 2 puffs into the lungs daily. 07/27/21   [provider]  torsemide  (DEMADEX ) 20 MG tablet Take 1 tablet (20 mg total) by mouth 2 (two) times daily. 08/08/23   Eilleen Grates, MD    Physical Exam: Vitals:   03/28/24 1954 03/28/24 1956 03/28/24 2000 03/28/24 2030  BP: 134/60 134/60 (!) 124/59 (!) 136/51  Pulse: (!) 50 (!) 55 (!) 51 (!) 49  Resp:  20    Temp:  98.2 F (36.8 C)    TempSrc:  Oral    SpO2: 94% 94% 94% 94%  Weight:      Height:       General: Elderly male. Awake and alert and oriented x3. No acute  cardiopulmonary distress.  HEENT: Normocephalic atraumatic.  Right and left ears normal in appearance.  Pupils equal, round, reactive to light. Extraocular muscles are intact. Sclerae anicteric and noninjected.  Moist mucosal membranes. No mucosal lesions.  Neck: Neck supple without lymphadenopathy. No carotid bruits. No masses palpated.  Cardiovascular: Regular rate with normal S1-S2 sounds. No murmurs, rubs, gallops auscultated. No JVD.  Respiratory: Prolonged exhalation with rales and rhonchi.  Moderate wheezing throughout.  No accessory muscle use. Abdomen: Soft, nontender, nondistended. Active bowel sounds. No masses or hepatosplenomegaly  Skin: No rashes, lesions, or ulcerations.  Dry, warm to touch. 2+ dorsalis pedis and radial pulses. Musculoskeletal: No calf or leg pain. All major joints not erythematous nontender.  No upper or lower joint deformation.  Good ROM.  No contractures  Psychiatric: Intact judgment and insight. Pleasant and cooperative.  Neurologic: No focal neurological deficits. Strength is 5/5 and symmetric in upper and lower extremities.  Cranial nerves II through XII are grossly intact.  Data Reviewed: Results for orders placed or performed during the hospital encounter of 03/28/24 (from the past 24 hours)  Resp panel by RT-PCR (RSV, Flu A&B, Covid) Anterior Nasal Swab     Status: None   Collection Time: 03/28/24  3:21 PM   Specimen: Anterior Nasal Swab  Result Value Ref Range   SARS Coronavirus 2 by RT PCR NEGATIVE NEGATIVE   Influenza A by PCR NEGATIVE NEGATIVE   Influenza B by PCR NEGATIVE NEGATIVE   Resp Syncytial Virus by PCR NEGATIVE NEGATIVE  Basic metabolic panel     Status: Abnormal   Collection Time: 03/28/24  3:43 PM  Result Value Ref Range   Sodium 130 (L) 135 - 145 mmol/L   Potassium 3.4 (L) 3.5 - 5.1 mmol/L   Chloride 85 (L) 98 - 111 mmol/L   CO2 33 (H) 22 - 32 mmol/L   Glucose, Bld 119 (H) 70 - 99 mg/dL   BUN 11 8 - 23 mg/dL   Creatinine, Ser 2.13  0.61 - 1.24 mg/dL   Calcium  8.7 (L) 8.9 - 10.3 mg/dL   GFR, Estimated >08 >65 mL/min   Anion gap 12 5 - 15  CBC with Differential     Status: Abnormal   Collection Time: 03/28/24  3:43 PM  Result Value Ref Range   WBC 19.1 (H) 4.0 - 10.5 K/uL   RBC 3.87 (L) 4.22 - 5.81 MIL/uL   Hemoglobin 11.4 (L) 13.0 - 17.0 g/dL   HCT 78.4 (L) 69.6 - 29.5 %   MCV 90.7 80.0 - 100.0 fL   MCH 29.5 26.0 - 34.0 pg   MCHC 32.5 30.0 - 36.0 g/dL   RDW 28.4 13.2 - 44.0 %   Platelets 255 150 - 400 K/uL   nRBC 0.0 0.0 - 0.2 %   Neutrophils Relative % 88 %   Neutro Abs 16.7 (H) 1.7 - 7.7 K/uL   Lymphocytes Relative 4 %   Lymphs Abs 0.8 0.7 - 4.0 K/uL   Monocytes Relative 7 %   Monocytes Absolute 1.4 (H) 0.1 - 1.0 K/uL   Eosinophils Relative 0 %   Eosinophils Absolute 0.0 0.0 - 0.5 K/uL   Basophils Relative 0 %   Basophils Absolute 0.1 0.0 - 0.1 K/uL   Immature Granulocytes 1 %   Abs Immature Granulocytes 0.14 (H) 0.00 - 0.07 K/uL  Troponin I (High Sensitivity)     Status: Abnormal   Collection Time: 03/28/24  3:43 PM  Result Value Ref Range   Troponin I (High Sensitivity) 19 (H) <18 ng/L  Brain natriuretic peptide     Status: Abnormal   Collection Time: 03/28/24  3:43 PM  Result Value Ref Range   B Natriuretic Peptide 671.0 (H) 0.0 - 100.0 pg/mL  Lactic acid, plasma     Status: None   Collection Time: 03/28/24  3:43 PM  Result Value Ref Range   Lactic Acid, Venous 0.9 0.5 - 1.9 mmol/L  Troponin I (High Sensitivity)     Status: Abnormal   Collection Time: 03/28/24  5:22 PM  Result Value Ref Range   Troponin I (High Sensitivity) 24 (H) <18 ng/L    CT Angio Chest PE W/Cm &/Or Wo Cm Result Date: 03/28/2024 CLINICAL DATA:  Pulmonary embolus suspected with high probability. Dry cough since yesterday. Shortness of breath today. EXAM: CT ANGIOGRAPHY CHEST WITH  CONTRAST TECHNIQUE: Multidetector CT imaging of the chest was performed using the standard protocol during bolus administration of intravenous  contrast. Multiplanar CT image reconstructions and MIPs were obtained to evaluate the vascular anatomy. RADIATION DOSE REDUCTION: This exam was performed according to the departmental dose-optimization program which includes automated exposure control, adjustment of the mA and/or kV according to patient size and/or use of iterative reconstruction technique. CONTRAST:  75mL OMNIPAQUE  IOHEXOL  350 MG/ML SOLN COMPARISON:  Chest radiograph 03/28/2024.  CT chest 03/31/2021 FINDINGS: Cardiovascular: Technically adequate study with good opacification of the central and segmental pulmonary arteries. Mild motion artifact. No focal filling defects are demonstrated. No evidence of significant pulmonary embolus. Cardiac enlargement. No pericardial effusions. Normal caliber thoracic aorta. No dissection. Mild aortic calcification. Coronary artery calcification. Cardiac pacemaker. Mediastinum/Nodes: Thyroid gland is unremarkable. Scattered lymph nodes are not pathologically enlarged, likely reactive. Esophagus is decompressed. Lungs/Pleura: Small bilateral pleural effusions. Atelectasis or consolidation in both lung bases, greater on the right. This could be due to pneumonia in the appropriate clinical setting. Severe diffuse emphysematous changes in the lungs. No pneumothorax. Upper Abdomen: Cholelithiasis.  No acute abnormalities. Musculoskeletal: Prominent degenerative changes in the thoracic spine with bridging osteophytes, possibly ankylosis. No acute bony abnormalities seen. Review of the MIP images confirms the above findings. IMPRESSION: 1. No evidence of significant pulmonary embolus. 2. Small bilateral pleural effusions. 3. Atelectasis or consolidation in both lung bases, possibly pneumonia in the appropriate clinical setting. 4. Aortic atherosclerosis. 5. Cholelithiasis without evidence of acute cholecystitis. Electronically Signed   By: Boyce Byes M.D.   On: 03/28/2024 19:48   DG Chest Port 1 View Result Date:  03/28/2024 CLINICAL DATA:  Shortness of breath. History of heart failure, coronary artery disease, COPD, pacemaker EXAM: PORTABLE CHEST 1 VIEW COMPARISON:  07/30/2023 FINDINGS: Cardiac pacemaker. Shallow inspiration. Cardiac enlargement with mild vascular congestion. Infiltration or atelectasis in both lung bases. Similar appearance to prior study. No pleural effusion or pneumothorax. Mediastinal contours appear intact. Calcification of the aorta. Degenerative changes in the spine. IMPRESSION: Cardiac enlargement with mild vascular congestion. Infiltration or atelectasis in both lung bases. Electronically Signed   By: Boyce Byes M.D.   On: 03/28/2024 17:44     Assessment and Plan: No notes have been filed under this hospital service. Service: Hospitalist  Principal Problem:   Acute respiratory failure with hypoxia (HCC) Active Problems:   CAD S/P percutaneous coronary angioplasty; bms X 2 to RCA 2001   Diabetes type 2, controlled (HCC)   Acute on chronic heart failure with preserved ejection fraction (HFpEF) (HCC)   COPD with acute exacerbation (HCC)   CAP (community acquired pneumonia)  Acute respiratory failure with hypoxia Respiratory assistance with nasal cannula Community-acquired pneumonia Antibiotics: rocephin  and azithromycin  Robitussin Blood cultures drawn in the emergency department Sputum cultures CBC tomorrow Strep and Legionella antigen by urine COPD with acute exacerbation Antibiotics: as above DuoNeb's every 6 scheduled with albuterol  every 2 when necessary Continue inhaled steroids and LA bronchodilator Solu-Medrol  60 mg IV every 12 hours Mucinex  Acute on chronic failure with preserved EF Telemetry monitoring Strict I/O Daily Weights Diuresis: Lasix  IV Potassium: 40 mEq twice a day by mouth Echo cardiac exam tomorrow Repeat BMP tomorrow Diabetes type 2 SSI   Advance Care Planning:   Code Status: Full Code confirmed by patient  Consults:  none  Family Communication: none  Severity of Illness: The appropriate patient status for this patient is INPATIENT. Inpatient status is judged to be reasonable and necessary in order  to provide the required intensity of service to ensure the patient's safety. The patient's presenting symptoms, physical exam findings, and initial radiographic and laboratory data in the context of their chronic comorbidities is felt to place them at high risk for further clinical deterioration. Furthermore, it is not anticipated that the patient will be medically stable for discharge from the hospital within 2 midnights of admission.   * I certify that at the point of admission it is my clinical judgment that the patient will require inpatient hospital care spanning beyond 2 midnights from the point of admission due to high intensity of service, high risk for further deterioration and high frequency of surveillance required.*  Author: Terese Heier J Cedrick Partain, DO 03/28/2024 8:50 PM  For on call review [email or web address].

## 2024-03-28 NOTE — ED Provider Notes (Signed)
 [place] [facility] AT [facility] Provider Note   CSN: [id number] Arrival date & time: 03/28/24  1506     History  No chief complaint on file.   Nathan Miller is a 73 y.o. male.  He has a history of COPD and CHF, on 2 L of oxygen , pacemaker, diabetic.  Complaining of shortness of breath starting last night, nonproductive cough, chills.  Could not sleep last night due to coughing and shortness of breath.  EMS found patient with sats 80% and placed on nonrebreather.  Given DuoNeb.  No sick contacts or recent travel.  No chest pain abdominal pain vomiting diarrhea.  Has chronic edema of his legs says he does not feel worse than baseline.  The history is provided by the patient and the EMS personnel.  Shortness of Breath Severity:  Moderate Onset quality:  Gradual Duration:  2 days Timing:  Constant Progression:  Unchanged Chronicity:  New Relieved by:  Nothing Worsened by:  Activity Ineffective treatments:  Rest Associated symptoms: cough   Associated symptoms: no abdominal pain, no chest pain, no fever and no vomiting        Home Medications Prior to Admission medications   Medication Sig Start Date End Date Taking? Authorizing Provider  acetaminophen  (TYLENOL ) 500 MG tablet Take 500 mg by mouth every 6 (six) hours as needed for mild pain or fever.    [provider]  albuterol  (VENTOLIN  HFA) 108 (90 Base) MCG/ACT inhaler Inhale 2 puffs into the lungs every 6 (six) hours as needed for wheezing or shortness of breath. 07/12/21   [provider]  aspirin  EC 81 MG tablet Take 1 tablet (81 mg total) by mouth daily. 01/25/15   Luevenia Saha, MD  atorvastatin  (LIPITOR ) 80 MG tablet Take 1 tablet (80 mg total) by mouth daily. 05/04/17   Allred, Royston Cornea, MD  carvedilol  (COREG ) 25 MG tablet Take 1 tablet (25 mg total) by mouth 2 (two) times daily. 05/04/17   Allred, Royston Cornea, MD  ferrous sulfate  325 (65 FE) MG tablet Take 325 mg by mouth every other day.     [provider]  metFORMIN  (GLUCOPHAGE ) 500 MG tablet Take 1 tablet (500 mg total) by mouth 2 (two) times daily with a meal. 01/25/15   Short, Tonita Frater, MD  Multiple Vitamins-Minerals (CENTRUM ADULTS) TABS Take 1 tablet by mouth daily.    [provider]  nitroGLYCERIN  (NITROSTAT ) 0.4 MG SL tablet Place 1 tablet (0.4 mg total) under the tongue every 5 (five) minutes as needed for chest pain. 05/04/23   Eilleen Grates, MD  potassium chloride  SA (KLOR-CON  M) 20 MEQ tablet Take 2 tablets (40 mEq total) by mouth daily. 08/02/23   Demaris Fillers, MD  SPIRIVA  RESPIMAT 2.5 MCG/ACT AERS Inhale 2 puffs into the lungs daily. 07/27/21   [provider]  torsemide  (DEMADEX ) 20 MG tablet Take 1 tablet (20 mg total) by mouth 2 (two) times daily. 08/08/23   Eilleen Grates, MD      Allergies    Patient has no known allergies.    Review of Systems   Review of Systems  Constitutional:  Positive for chills. Negative for fever.  Respiratory:  Positive for cough and shortness of breath.   Cardiovascular:  Negative for chest pain.  Gastrointestinal:  Negative for abdominal pain, nausea and vomiting.  Genitourinary:  Negative for dysuria.    Physical Exam Updated Vital Signs BP (!) 111/50 (BP Location: Right Arm)   Pulse (!) 50  Temp 97.8 F (36.6 C) (Oral)   Resp 20   Ht 5\' 8"  (1.727 m)   Wt 120.1 kg Comment: Pt's. weight was taken with prosthetic leg  SpO2 97%   BMI 40.26 kg/m  Physical Exam Vitals and nursing note reviewed.  Constitutional:      General: He is not in acute distress.    Appearance: Normal appearance. He is well-developed.  HENT:     Head: Normocephalic and atraumatic.  Eyes:     Conjunctiva/sclera: Conjunctivae normal.  Cardiovascular:     Rate and Rhythm: Normal rate and regular rhythm.     Heart sounds: No murmur heard. Pulmonary:     Effort: Pulmonary effort is normal. No respiratory distress.     Breath sounds: Wheezing and rales present.   Abdominal:     Palpations: Abdomen is soft.     Tenderness: There is no abdominal tenderness. There is no guarding or rebound.  Musculoskeletal:     Cervical back: Neck supple.     Left lower leg: Edema present.     Comments: Right BKA  Skin:    General: Skin is warm and dry.     Capillary Refill: Capillary refill takes less than 2 seconds.  Neurological:     General: No focal deficit present.     Mental Status: He is alert.     ED Results / Procedures / Treatments   Labs (all labs ordered are listed, but only abnormal results are displayed) Labs Reviewed  BASIC METABOLIC PANEL WITH GFR - Abnormal; Notable for the following components:      Result Value   Sodium 130 (*)    Potassium 3.4 (*)    Chloride 85 (*)    CO2 33 (*)    Glucose, Bld 119 (*)    Calcium  8.7 (*)    All other components within normal limits  CBC WITH DIFFERENTIAL/PLATELET - Abnormal; Notable for the following components:   WBC 19.1 (*)    RBC 3.87 (*)    Hemoglobin 11.4 (*)    HCT 35.1 (*)    Neutro Abs 16.7 (*)    Monocytes Absolute 1.4 (*)    Abs Immature Granulocytes 0.14 (*)    All other components within normal limits  BRAIN NATRIURETIC PEPTIDE - Abnormal; Notable for the following components:   B Natriuretic Peptide 671.0 (*)    All other components within normal limits  BASIC METABOLIC PANEL WITH GFR - Abnormal; Notable for the following components:   Chloride 89 (*)    CO2 36 (*)    Glucose, Bld 202 (*)    Calcium  8.7 (*)    All other components within normal limits  CBC - Abnormal; Notable for the following components:   WBC 17.0 (*)    RBC 3.91 (*)    Hemoglobin 11.5 (*)    HCT 36.0 (*)    All other components within normal limits  GLUCOSE, CAPILLARY - Abnormal; Notable for the following components:   Glucose-Capillary 273 (*)    All other components within normal limits  GLUCOSE, CAPILLARY - Abnormal; Notable for the following components:   Glucose-Capillary 144 (*)    All  other components within normal limits  TROPONIN I (HIGH SENSITIVITY) - Abnormal; Notable for the following components:   Troponin I (High Sensitivity) 19 (*)    All other components within normal limits  TROPONIN I (HIGH SENSITIVITY) - Abnormal; Notable for the following components:   Troponin I (High Sensitivity) 24 (*)  All other components within normal limits  CULTURE, BLOOD (ROUTINE X 2)  CULTURE, BLOOD (ROUTINE X 2)  RESP PANEL BY RT-PCR (RSV, FLU A&B, COVID)  RVPGX2  RESPIRATORY PANEL BY PCR  LACTIC ACID, PLASMA  STREP PNEUMONIAE URINARY ANTIGEN  LEGIONELLA PNEUMOPHILA SEROGP 1 UR AG  HEMOGLOBIN A1C    EKG EKG Interpretation Date/Time:  Friday Mar 28 2024 15:21:29 EDT Ventricular Rate:  54 PR Interval:    QRS Duration:  184 QT Interval:  464 QTC Calculation: 440 R Axis:   -80  Text Interpretation: Afib/flutter and ventricular-paced rhythm No further analysis attempted due to paced rhythm No significant change since prior 9/24 Confirmed by Racheal Buddle [phone]) on 03/28/2024 3:30:35 PM  Radiology CT Angio Chest PE W/Cm &/Or Wo Cm Result Date: 03/28/2024 CLINICAL DATA:  Pulmonary embolus suspected with high probability. Dry cough since yesterday. Shortness of breath today. EXAM: CT ANGIOGRAPHY CHEST WITH CONTRAST TECHNIQUE: Multidetector CT imaging of the chest was performed using the standard protocol during bolus administration of intravenous contrast. Multiplanar CT image reconstructions and MIPs were obtained to evaluate the vascular anatomy. RADIATION DOSE REDUCTION: This exam was performed according to the departmental dose-optimization program which includes automated exposure control, adjustment of the mA and/or kV according to patient size and/or use of iterative reconstruction technique. CONTRAST:  75mL OMNIPAQUE  IOHEXOL  350 MG/ML SOLN COMPARISON:  Chest radiograph 03/28/2024.  CT chest 03/31/2021 FINDINGS: Cardiovascular: Technically adequate study with good  opacification of the central and segmental pulmonary arteries. Mild motion artifact. No focal filling defects are demonstrated. No evidence of significant pulmonary embolus. Cardiac enlargement. No pericardial effusions. Normal caliber thoracic aorta. No dissection. Mild aortic calcification. Coronary artery calcification. Cardiac pacemaker. Mediastinum/Nodes: Thyroid gland is unremarkable. Scattered lymph nodes are not pathologically enlarged, likely reactive. Esophagus is decompressed. Lungs/Pleura: Small bilateral pleural effusions. Atelectasis or consolidation in both lung bases, greater on the right. This could be due to pneumonia in the appropriate clinical setting. Severe diffuse emphysematous changes in the lungs. No pneumothorax. Upper Abdomen: Cholelithiasis.  No acute abnormalities. Musculoskeletal: Prominent degenerative changes in the thoracic spine with bridging osteophytes, possibly ankylosis. No acute bony abnormalities seen. Review of the MIP images confirms the above findings. IMPRESSION: 1. No evidence of significant pulmonary embolus. 2. Small bilateral pleural effusions. 3. Atelectasis or consolidation in both lung bases, possibly pneumonia in the appropriate clinical setting. 4. Aortic atherosclerosis. 5. Cholelithiasis without evidence of acute cholecystitis. Electronically Signed   By: Boyce Byes M.D.   On: 03/28/2024 19:48   DG Chest Port 1 View Result Date: 03/28/2024 CLINICAL DATA:  Shortness of breath. History of heart failure, coronary artery disease, COPD, pacemaker EXAM: PORTABLE CHEST 1 VIEW COMPARISON:  07/30/2023 FINDINGS: Cardiac pacemaker. Shallow inspiration. Cardiac enlargement with mild vascular congestion. Infiltration or atelectasis in both lung bases. Similar appearance to prior study. No pleural effusion or pneumothorax. Mediastinal contours appear intact. Calcification of the aorta. Degenerative changes in the spine. IMPRESSION: Cardiac enlargement with mild  vascular congestion. Infiltration or atelectasis in both lung bases. Electronically Signed   By: Boyce Byes M.D.   On: 03/28/2024 17:44    Procedures .Critical Care  Performed by: Tonya Fredrickson, MD Authorized by: Tonya Fredrickson, MD   Critical care provider statement:    Critical care time (minutes):  45   Critical care time was exclusive of:  Separately billable procedures and treating other patients   Critical care was necessary to treat or prevent imminent or life-threatening deterioration of the following  conditions:  Respiratory failure   Critical care was time spent personally by me on the following activities:  Development of treatment plan with patient or surrogate, discussions with consultants, evaluation of patient's response to treatment, examination of patient, obtaining history from patient or surrogate, ordering and performing treatments and interventions, ordering and review of laboratory studies, ordering and review of radiographic studies, pulse oximetry, re-evaluation of patient's condition and review of old charts   I assumed direction of critical care for this patient from another provider in my specialty: no       Medications Ordered in ED Medications  atorvastatin  (LIPITOR ) tablet 80 mg ( Oral Canceled Entry 03/29/24 0828)  albuterol  (PROVENTIL ) (2.5 MG/3ML) 0.083% nebulizer solution 3 mL (has no administration in time range)  cefTRIAXone  (ROCEPHIN ) 2 g in sodium chloride  0.9 % 100 mL IVPB (has no administration in time range)  azithromycin  (ZITHROMAX ) 500 mg in sodium chloride  0.9 % 250 mL IVPB (has no administration in time range)  insulin  aspart (novoLOG ) injection 0-5 Units (3 Units Subcutaneous Given 03/28/24 2301)  insulin  aspart (novoLOG ) injection 0-20 Units (3 Units Subcutaneous Given 03/29/24 0829)  ondansetron  (ZOFRAN ) tablet 4 mg (has no administration in time range)    Or  ondansetron  (ZOFRAN ) injection 4 mg (has no administration in time range)   furosemide  (LASIX ) injection 60 mg (60 mg Intravenous Given 03/29/24 0828)  potassium chloride  SA (KLOR-CON  M) CR tablet 40 mEq (40 mEq Oral Given 03/29/24 0829)  ipratropium-albuterol  (DUONEB) 0.5-2.5 (3) MG/3ML nebulizer solution 3 mL (3 mLs Nebulization Given 03/29/24 0757)  arformoterol  (BROVANA ) nebulizer solution 15 mcg (15 mcg Nebulization Given 03/29/24 0757)  budesonide  (PULMICORT ) nebulizer solution 0.5 mg (0.5 mg Nebulization Given 03/29/24 0757)  enoxaparin  (LOVENOX ) injection 60 mg (has no administration in time range)  methylPREDNISolone  sodium succinate (SOLU-MEDROL ) 125 mg/2 mL injection 60 mg (has no administration in time range)  magnesium  sulfate IVPB 2 g 50 mL (0 g Intravenous Stopped 03/28/24 1715)  methylPREDNISolone  sodium succinate (SOLU-MEDROL ) 125 mg/2 mL injection 125 mg (125 mg Intravenous Given 03/28/24 1610)  ipratropium-albuterol  (DUONEB) 0.5-2.5 (3) MG/3ML nebulizer solution 3 mL (3 mLs Nebulization Given 03/28/24 1600)  acetaminophen  (TYLENOL ) tablet 650 mg (650 mg Oral Given 03/28/24 1744)  furosemide  (LASIX ) injection 40 mg (40 mg Intravenous Given 03/28/24 1854)  potassium chloride  SA (KLOR-CON  M) CR tablet 40 mEq (40 mEq Oral Given 03/28/24 1854)  iohexol  (OMNIPAQUE ) 350 MG/ML injection 75 mL (75 mLs Intravenous Contrast Given 03/28/24 1805)  cefTRIAXone  (ROCEPHIN ) 1 g in sodium chloride  0.9 % 100 mL IVPB (0 g Intravenous Stopped 03/28/24 2023)  azithromycin  (ZITHROMAX ) 500 mg in sodium chloride  0.9 % 250 mL IVPB (500 mg Intravenous New Bag/Given 03/28/24 2031)  methylPREDNISolone  sodium succinate (SOLU-MEDROL ) 125 mg/2 mL injection 60 mg (60 mg Intravenous Given 03/29/24 0828)    ED Course/ Medical Decision Making/ A&P Clinical Course as of 03/29/24 0953  Fri Mar 28, 2024  1555 Chest x-ray showing some bibasilar atelectasis versus possible infiltrate.  Awaiting radiology reading. [MB]  2003 Discussed with [facility] hospitalist Dr. Cathyann Cobia will evaluate patient for  admission. [MB]    Clinical Course User Index [MB] Tonya Fredrickson, MD                                 Medical Decision Making Amount and/or Complexity of Data Reviewed Labs: ordered. Radiology: ordered.  Risk OTC drugs. Prescription drug management. Decision regarding hospitalization.  This patient complains of shortness of breath chills; this involves an extensive number of treatment Options and is a complaint that carries with it a high risk of complications and morbidity. The differential includes pneumonia, PE, pneumothorax, CHF, COPD, COVID, flu  I ordered, reviewed and interpreted labs, which included CBC with markedly elevated white count, BNP moderately elevated, troponins elevated and rising slightly, blood culture sent, lactate normal, COVID flu negative I ordered medication IV Lasix , steroids, breathing treatment, potassium, antibiotics and reviewed PMP when indicated. I ordered imaging studies which included chest x-ray and CT angio chest and I independently    visualized and interpreted imaging which showed no evidence of PE.  Does have probable pneumonia Additional history obtained from patient's wife and EMS Previous records obtained and reviewed in epic including recent PCP and cardiology notes I consulted [facility] hospitalist Dr. Cathyann Cobia and discussed lab and imaging findings and discussed disposition.  Cardiac monitoring reviewed, sinus bradycardia Social determinants considered, tobacco use and physically inactive Critical Interventions: Initiation of supplemental oxygen  and antibiotics, diuresis for multifactorial hypoxia  After the interventions stated above, I reevaluated the patient and found patient to be doing fairly well at rest on increased oxygen  level Admission and further testing considered, he would benefit from mission to the hospital for further treatment.  He is in agreement with plan for admission.         Final Clinical Impression(s) /  ED Diagnoses Final diagnoses:  Acute respiratory failure with hypoxia (HCC)  Acute on chronic congestive heart failure, unspecified heart failure type (HCC)  Community acquired pneumonia, unspecified laterality    Rx / DC Orders ED Discharge Orders     None         Tonya Fredrickson, MD 03/29/24 [phone]

## 2024-03-29 ENCOUNTER — Inpatient Hospital Stay (HOSPITAL_COMMUNITY)

## 2024-03-29 ENCOUNTER — Other Ambulatory Visit (HOSPITAL_COMMUNITY): Payer: Self-pay | Admitting: *Deleted

## 2024-03-29 DIAGNOSIS — I5033 Acute on chronic diastolic (congestive) heart failure: Secondary | ICD-10-CM | POA: Diagnosis not present

## 2024-03-29 DIAGNOSIS — J441 Chronic obstructive pulmonary disease with (acute) exacerbation: Secondary | ICD-10-CM | POA: Diagnosis not present

## 2024-03-29 DIAGNOSIS — J9601 Acute respiratory failure with hypoxia: Secondary | ICD-10-CM | POA: Diagnosis not present

## 2024-03-29 LAB — ECHOCARDIOGRAM COMPLETE
AR max vel: 2.72 cm2
AV Area VTI: 2.48 cm2
AV Area mean vel: 2.72 cm2
AV Mean grad: 3.5 mmHg
AV Peak grad: 7.4 mmHg
Ao pk vel: 1.36 m/s
Area-P 1/2: 4.94 cm2
Height: 68 in
S' Lateral: 3.6 cm
Weight: 4236.36 [oz_av]

## 2024-03-29 LAB — CBC
HCT: 36 % — ABNORMAL LOW (ref 39.0–52.0)
Hemoglobin: 11.5 g/dL — ABNORMAL LOW (ref 13.0–17.0)
MCH: 29.4 pg (ref 26.0–34.0)
MCHC: 31.9 g/dL (ref 30.0–36.0)
MCV: 92.1 fL (ref 80.0–100.0)
Platelets: 239 10*3/uL (ref 150–400)
RBC: 3.91 MIL/uL — ABNORMAL LOW (ref 4.22–5.81)
RDW: 15.2 % (ref 11.5–15.5)
WBC: 17 10*3/uL — ABNORMAL HIGH (ref 4.0–10.5)
nRBC: 0 % (ref 0.0–0.2)

## 2024-03-29 LAB — GLUCOSE, CAPILLARY
Glucose-Capillary: 144 mg/dL — ABNORMAL HIGH (ref 70–99)
Glucose-Capillary: 293 mg/dL — ABNORMAL HIGH (ref 70–99)
Glucose-Capillary: 120 mg/dL — ABNORMAL HIGH (ref 70–99)
Glucose-Capillary: 151 mg/dL — ABNORMAL HIGH (ref 70–99)

## 2024-03-29 LAB — BASIC METABOLIC PANEL WITH GFR
Anion gap: 10 (ref 5–15)
BUN: 14 mg/dL (ref 8–23)
CO2: 36 mmol/L — ABNORMAL HIGH (ref 22–32)
Calcium: 8.7 mg/dL — ABNORMAL LOW (ref 8.9–10.3)
Chloride: 89 mmol/L — ABNORMAL LOW (ref 98–111)
Creatinine, Ser: 0.91 mg/dL (ref 0.61–1.24)
GFR, Estimated: >60 mL/min (ref >60)
Glucose, Bld: 202 mg/dL — ABNORMAL HIGH (ref 70–99)
Potassium: 4 mmol/L (ref 3.5–5.1)
Sodium: 135 mmol/L (ref 135–145)

## 2024-03-29 LAB — HEMOGLOBIN A1C
Hgb A1c MFr Bld: 5.9 % — ABNORMAL HIGH (ref 4.8–5.6)
Mean Plasma Glucose: 122.63 mg/dL

## 2024-03-29 LAB — PROCALCITONIN: Procalcitonin: 0.37 ng/mL

## 2024-03-29 MED ORDER — ARFORMOTEROL TARTRATE 15 MCG/2ML IN NEBU
15.0000 ug | INHALATION_SOLUTION | Freq: Two times a day (BID) | RESPIRATORY_TRACT | Status: DC
  Administered 2024-03-29 – 2024-04-01 (×7): 15 ug via RESPIRATORY_TRACT
  Filled 2024-03-29 (×7): qty 2

## 2024-03-29 MED ORDER — BUDESONIDE 0.5 MG/2ML IN SUSP
0.5000 mg | Freq: Two times a day (BID) | RESPIRATORY_TRACT | Status: DC
  Administered 2024-03-29 – 2024-04-01 (×7): 0.5 mg via RESPIRATORY_TRACT
  Filled 2024-03-29 (×7): qty 2

## 2024-03-29 MED ORDER — IPRATROPIUM-ALBUTEROL 0.5-2.5 (3) MG/3ML IN SOLN
3.0000 mL | Freq: Two times a day (BID) | RESPIRATORY_TRACT | Status: DC
  Administered 2024-03-29 – 2024-04-01 (×6): 3 mL via RESPIRATORY_TRACT
  Filled 2024-03-29 (×6): qty 3

## 2024-03-29 MED ORDER — ENOXAPARIN SODIUM 60 MG/0.6ML IJ SOSY
60.0000 mg | PREFILLED_SYRINGE | INTRAMUSCULAR | Status: DC
  Administered 2024-03-29 – 2024-03-31 (×3): 60 mg via SUBCUTANEOUS
  Filled 2024-03-29 (×3): qty 0.6

## 2024-03-29 MED ORDER — METHYLPREDNISOLONE SODIUM SUCC 125 MG IJ SOLR
60.0000 mg | Freq: Two times a day (BID) | INTRAMUSCULAR | Status: DC
  Administered 2024-03-29 – 2024-03-31 (×4): 60 mg via INTRAVENOUS
  Filled 2024-03-29 (×4): qty 2

## 2024-03-29 MED ORDER — PERFLUTREN LIPID MICROSPHERE
1.0000 mL | INTRAVENOUS | Status: AC | PRN
  Administered 2024-03-29: 3 mL via INTRAVENOUS

## 2024-03-29 MED ORDER — IPRATROPIUM-ALBUTEROL 0.5-2.5 (3) MG/3ML IN SOLN
3.0000 mL | Freq: Four times a day (QID) | RESPIRATORY_TRACT | Status: DC
  Administered 2024-03-29: 3 mL via RESPIRATORY_TRACT
  Filled 2024-03-29: qty 3

## 2024-03-29 NOTE — Progress Notes (Signed)
*  PRELIMINARY RESULTS* Echocardiogram 2D Echocardiogram has been performed with Def[facility] .  Bernis Brisker 03/29/2024, 11:20 AM

## 2024-03-29 NOTE — TOC Initial Note (Signed)
 Transition of Care [facility]) - Initial/Assessment Note    Patient Details  Name: Nathan Miller MRN: [id number] Date of Birth: Dec 04, 1950  Transition of [facility]) CM/SW Contact:    Lynda Sands, RN Phone Number: 03/29/2024, 6:00 PM  Clinical Narrative:    CM met with patient at bedside . Patient lives with his spouse in a mobile home with ramp to enter. Patient is independent with adl's . Patient use a walker to ambulate. Patient does have a right leg prothesis.   DME Oxygen  2l/min with [employer], rolling walker that was given to him. The rolling walker is not tall enough for patient. Patient has a wheelchair, lift chair and a lift bed.  Patient request a rolling walker. Patient plan to be discharge home at discharge.       Expected Discharge Plan: Home w Home Health Services Barriers to Discharge: Continued Medical Work up   Patient Goals and CMS Choice   CMS Medicare.gov Compare Post Acute Care list provided to:: Patient Choice offered to / list presented to : Patient      Expected Discharge Plan and Services     Post Acute Care Choice: Home Health Living arrangements for the past 2 months: Mobile Home   Prior Living Arrangements/Services Living arrangements for the past 2 months: Mobile Home Lives with:: Spouse Patient language and need for interpreter reviewed:: No        Need for Family Participation in Patient Care: Yes (Comment) Care giver support system in place?: Yes (comment)   Criminal Activity/Legal Involvement Pertinent to Current Situation/Hospitalization: No - Comment as needed  Activities of Daily Living   ADL Screening (condition at time of admission) Independently performs ADLs?: Yes (appropriate for developmental age) Is the patient deaf or have difficulty hearing?: No Does the patient have difficulty seeing, even when wearing glasses/contacts?: No Does the patient have difficulty concentrating, remembering, or making decisions?: No  Permission  Sought/Granted   Permission granted to share information with : Yes, Verbal Permission Granted  Share Information with NAME: Simmie Camerer     Permission granted to share info w Relationship: Spouse  Permission granted to share info w Contact Information: [phone]  Emotional Assessment Appearance:: Appears stated age Attitude/Demeanor/Rapport: Self-Confident Affect (typically observed): Accepting Orientation: : Oriented to Self, Oriented to Situation, Oriented to Place, Oriented to  Time   Psych Involvement: No (comment)  Admission diagnosis:  Acute respiratory failure with hypoxia (HCC) [J96.01] Patient Active Problem List   Diagnosis Date Noted   CAP (community acquired pneumonia) 03/28/2024   Acute on chronic heart failure with preserved ejection fraction (HFpEF) (HCC) 08/01/2023   COPD with acute exacerbation (HCC) 08/01/2023   Acute on chronic respiratory failure (HCC) 07/30/2023   Uncontrolled type 2 diabetes mellitus with hyperglycemia, with long-term current use of insulin  (HCC) 07/30/2023   Chronic diastolic HF (heart failure) (HCC) 06/05/2022   PAD (peripheral artery disease) (HCC) 06/05/2022   Diabetic foot infection (HCC) 08/01/2021   Bacteremia 04/20/2021   Acute respiratory failure with hypoxia (HCC) 03/31/2021   Obesity with body mass index (BMI) of 30.0 to 39.9 03/31/2021   Elevated d-dimer 03/31/2021   Elevated brain natriuretic peptide (BNP) level 03/31/2021   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   Umbilical hernia 01/29/2016   Obesity 01/29/2016   Marijuana use 01/29/2016   Diabetes type 2, controlled (HCC) 01/29/2016   Diverticulitis of intestine with perforation without bleeding    Blood poisoning    Diverticulitis of intestine with perforation 06/13/2015  Diverticulitis 06/13/2015   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   Faintness    Bradycardia 01/21/2015   Syncope 01/21/2015   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   Right foot ulcer  (HCC) 01/21/2015   Diabetes mellitus type 2, uncontrolled 01/21/2015   Hyperlipidemia with target low density lipoprotein (LDL) cholesterol less than 100 mg/dL 16/07/9603   Tobacco use disorder 02/11/2013   Anxiety state 02/11/2013   Essential hypertension 05/10/2011   Papule 03/20/2011   Subacute osteomyelitis, right ankle and foot (HCC)    CAD S/P percutaneous coronary angioplasty; bms X 2 to RCA 2001 01/22/2000    Class: Diagnosis of   PCP:  [facility]:   [facility] [address] [place] - [address] [place] [address] Phone: [phone] Fax: [phone]     Social Drivers of Health (SDOH) Social History: SDOH Screenings   Food Insecurity: No Food Insecurity (03/29/2024)  Housing: Low Risk  (03/29/2024)  Transportation Needs: No Transportation Needs (03/29/2024)  Utilities: Not At Risk (03/29/2024)  Financial Resource Strain: Low Risk  (06/28/2021)   Received [facility]  Physical Activity: Inactive (07/13/2021)   Received from [facility]  Social Connections: Socially Integrated (03/29/2024)  Stress: No Stress Concern Present (07/13/2021)   Received from [facility], [facility]  Tobacco Use: High Risk (03/28/2024)  Health Literacy: Low Risk  (07/13/2021)   Received from [facility], [facility]   SDOH Interventions:     Readmission Risk Interventions     No data to display

## 2024-03-29 NOTE — Plan of Care (Signed)
  Problem: Education: Goal: Knowledge of General Education information will improve Description: Including pain rating scale, medication(s)/side effects and non-pharmacologic comfort measures Outcome: Progressing   Problem: Health Behavior/Discharge Planning: Goal: Ability to manage health-related needs will improve Outcome: Progressing   Problem: Clinical Measurements: Goal: Ability to maintain clinical measurements within normal limits will improve Outcome: Progressing Goal: Will remain free from infection Outcome: Progressing Goal: Diagnostic test results will improve Outcome: Progressing Goal: Respiratory complications will improve Outcome: Progressing   Problem: Elimination: Goal: Will not experience complications related to urinary retention Outcome: Progressing   Problem: Pain Managment: Goal: General experience of comfort will improve and/or be controlled Outcome: Progressing   Problem: Safety: Goal: Ability to remain free from injury will improve Outcome: Progressing   Problem: Skin Integrity: Goal: Risk for impaired skin integrity will decrease Outcome: Progressing   Problem: Activity: Goal: Capacity to carry out activities will improve Outcome: Progressing   Problem: Cardiac: Goal: Ability to achieve and maintain adequate cardiopulmonary perfusion will improve Outcome: Progressing   Problem: Activity: Goal: Ability to tolerate increased activity will improve Outcome: Progressing Goal: Will verbalize the importance of balancing activity with adequate rest periods Outcome: Progressing   Problem: Respiratory: Goal: Ability to maintain a clear airway will improve Outcome: Progressing Goal: Levels of oxygenation will improve Outcome: Progressing Goal: Ability to maintain adequate ventilation will improve Outcome: Progressing   Problem: Activity: Goal: Ability to tolerate increased activity will improve Outcome: Progressing   Problem: Clinical  Measurements: Goal: Ability to maintain a body temperature in the normal range will improve Outcome: Progressing   Problem: Respiratory: Goal: Ability to maintain adequate ventilation will improve Outcome: Progressing Goal: Ability to maintain a clear airway will improve Outcome: Progressing   Problem: Coping: Goal: Ability to adjust to condition or change in health will improve Outcome: Progressing   Problem: Fluid Volume: Goal: Ability to maintain a balanced intake and output will improve Outcome: Progressing   Problem: Health Behavior/Discharge Planning: Goal: Ability to identify and utilize available resources and services will improve Outcome: Progressing Goal: Ability to manage health-related needs will improve Outcome: Progressing   Problem: Metabolic: Goal: Ability to maintain appropriate glucose levels will improve Outcome: Progressing   Problem: Nutritional: Goal: Maintenance of adequate nutrition will improve Outcome: Progressing Goal: Progress toward achieving an optimal weight will improve Outcome: Progressing   Problem: Skin Integrity: Goal: Risk for impaired skin integrity will decrease Outcome: Progressing   Problem: Tissue Perfusion: Goal: Adequacy of tissue perfusion will improve Outcome: Progressing

## 2024-03-29 NOTE — Progress Notes (Signed)
   03/29/24 1755  TOC Assessment  TOC screening is complete Yes  Once discharged, how will the patient get to their discharge location? Family/Friend - Administrator, arts Home w Home Health Services  Final next level of care Home w Home Health Services  Barriers to Discharge Continued Medical Work up  [employer].gov Compare Post Acute Care list provided to: Patient  Choice offered to / list presented to  Patient  Post Acute Care Choice Home Health  Living arrangements for the past 2 months Mobile Home  Lives with: Spouse  Permission granted to share information with  Yes, Verbal Permission Granted  Share Information with NAME Shrihaan Porzio  Permission granted to share info w Relationship Spouse  Permission granted to share info w Contact Information [phone]  Patient language and need for interpreter reviewed: No  Criminal Activity/Legal Involvement Pertinent to Current Situation/Hospitalization No - Comment as needed  Need for Family Participation in Patient Care Y  Care giver support system in place? Y  Appearance: Appears stated age  Attitude/Demeanor/Rapport Self-Confident  Affect (typically observed) Accepting  Orientation:  Oriented to Self;Oriented to Situation;Oriented to Place;Oriented to  Time  Psych Involvement N   Admitted for Acute Respiratory Failure. TOC will follow through progression  rounds.

## 2024-03-29 NOTE — Hospital Course (Signed)
 73 year old male with a history COPD, type 2 diabetes with hyperglycemia and chronic use of insulin , class III obesity, chronic respiratory failure (2 L at baseline), hyperlipidemia and diastolic heart failure presented at least 1 day history of shortness of breath and nonproductive cough.  He has noted some orthopnea.  He feels that his chronic leg edema is about the same as usual.  He quit smoking 20 years ago, but his wife continues to smoke.  He denies any fevers, chills, chest pain, nausea, vomiting or direct abdominal pain.  He denies any medication or melena or hematuria. In the ED, the patient was afebrile and hemodynamically stable.  EMS noted oxygen  saturation in the 80% range.  He was placed on nonrebreather initially.  WBC 19.1, hemoglobin 1.4, platelets 215.  Sodium 130, potassium 3.4, bicarbonate 33, serum creatinine 0.76.  BNP 671.  Troponin 19>> 24.  CTA chest was negative for pulmonary embolus.  There were small bilateral pleural effusions.  There is atelectasis versus consolidation of the lung bases.  There is diffuse emphysematous changes in the bilateral lungs.  The patient was started on IV furosemide , DuoNebs, and Solu-Medrol .  There is manage further evaluation and treatment of his respiratory failure.

## 2024-03-29 NOTE — Plan of Care (Signed)
  Problem: Education: Goal: Knowledge of General Education information will improve Description: Including pain rating scale, medication(s)/side effects and non-pharmacologic comfort measures Outcome: Progressing   Problem: Health Behavior/Discharge Planning: Goal: Ability to manage health-related needs will improve Outcome: Progressing   Problem: Clinical Measurements: Goal: Ability to maintain clinical measurements within normal limits will improve Outcome: Progressing Goal: Will remain free from infection Outcome: Progressing Goal: Diagnostic test results will improve Outcome: Progressing Goal: Respiratory complications will improve Outcome: Progressing Goal: Cardiovascular complication will be avoided Outcome: Not Applicable   Problem: Activity: Goal: Risk for activity intolerance will decrease Outcome: Not Applicable   Problem: Nutrition: Goal: Adequate nutrition will be maintained Outcome: Not Applicable   Problem: Coping: Goal: Level of anxiety will decrease Outcome: Not Applicable   Problem: Elimination: Goal: Will not experience complications related to bowel motility Outcome: Not Applicable   Problem: Pain Managment: Goal: General experience of comfort will improve and/or be controlled Outcome: Progressing   Problem: Safety: Goal: Ability to remain free from injury will improve Outcome: Progressing   Problem: Skin Integrity: Goal: Risk for impaired skin integrity will decrease Outcome: Progressing   Problem: Education: Goal: Ability to demonstrate management of disease process will improve Outcome: Not Applicable Goal: Ability to verbalize understanding of medication therapies will improve Outcome: Not Applicable Goal: Individualized Educational Video(s) Outcome: Not Applicable   Problem: Activity: Goal: Capacity to carry out activities will improve Outcome: Progressing   Problem: Cardiac: Goal: Ability to achieve and maintain adequate  cardiopulmonary perfusion will improve Outcome: Progressing   Problem: Education: Goal: Knowledge of disease or condition will improve Outcome: Not Applicable Goal: Knowledge of the prescribed therapeutic regimen will improve Outcome: Not Applicable Goal: Individualized Educational Video(s) Outcome: Not Applicable   Problem: Respiratory: Goal: Ability to maintain a clear airway will improve Outcome: Progressing Goal: Levels of oxygenation will improve Outcome: Progressing   Problem: Activity: Goal: Ability to tolerate increased activity will improve Outcome: Progressing   Problem: Clinical Measurements: Goal: Ability to maintain a body temperature in the normal range will improve Outcome: Progressing   Problem: Respiratory: Goal: Ability to maintain adequate ventilation will improve Outcome: Progressing Goal: Ability to maintain a clear airway will improve Outcome: Progressing   Problem: Education: Goal: Ability to describe self-care measures that may prevent or decrease complications (Diabetes Survival Skills Education) will improve Outcome: Not Applicable Goal: Individualized Educational Video(s) Outcome: Not Applicable

## 2024-03-29 NOTE — Progress Notes (Signed)
 PROGRESS NOTE  Nathan Miller ION:[id number] DOB: 1951/01/19 DOA: 03/28/2024 PCP: [facility]  Brief History:  73 year old male with a history COPD, type 2 diabetes with hyperglycemia and chronic use of insulin , class III obesity, chronic respiratory failure (2 L at baseline), hyperlipidemia and diastolic heart failure presented at least 1 day history of shortness of breath and nonproductive cough.  He has noted some orthopnea.  He feels that his chronic leg edema is about the same as usual.  He quit smoking 20 years ago, but his wife continues to smoke.  He denies any fevers, chills, chest pain, nausea, vomiting or direct abdominal pain.  He denies any medication or melena or hematuria. In the ED, the patient was afebrile and hemodynamically stable.  EMS noted oxygen  saturation in the 80% range.  He was placed on nonrebreather initially.  WBC 19.1, hemoglobin 1.4, platelets 215.  Sodium 130, potassium 3.4, bicarbonate 33, serum creatinine 0.76.  BNP 671.  Troponin 19>> 24.  CTA chest was negative for pulmonary embolus.  There were small bilateral pleural effusions.  There is atelectasis versus consolidation of the lung bases.  There is diffuse emphysematous changes in the bilateral lungs.  The patient was started on IV furosemide , DuoNebs, and Solu-Medrol .  There is manage further evaluation and treatment of his respiratory failure.   Assessment/Plan: Acute on chronic respiratory failure with hypoxia -Multifactorial: In the setting of COPD/bronchiectasis exacerbation and acute on chronic diastolic heart failure and possible pneumonia -initially on NRB>>weaned to 5L presently -chronically on 2L -wean oxygen  for saturation >92%   Acute on chronic HFpEF -07/30/23 Echo EF 55%, no WMA, G1DD, normal RVF -remains clinically fluid overloaded -continue IV lasix  -obtain ReDS vest reading- -holding jardiance  temporarily   COPD Exacerbation -nearly 60 pack years  tobacco, quit 10 yrs ago -continue duoneb -continue solumedrol -continue brovana  and pulmicort  -continue ceftriaxone  and azithro   CHB s/p PPM -follow up Dr. Nunzio Belch   Hyperlipidemia -Continue Lipitor .   Type 2 diabetes with hyperglycemia -07/30/23 A1c 5.8 -Continue sliding scale insulin  -Continue holding metformin   Class III obesity -Body mass index is 40.26 kg/m. -lifestyle modification   Essential hypertension -holding coreg  due to soft BPs   Hyponatremia: -due to fluid overload -improving with diuresis   History of PAD -Continue risk factor modification -Status post right BKA -Patient will continue the use of prosthesis to facilitate ambulation.             Family Communication:  no Family at bedside  Consultants:  none  Code Status:  FULL   DVT Prophylaxis:  [place] Lovenox    Procedures: As Listed in Progress Note Above  Antibiotics: Ceftriaxone  5/30>> Azithro 5/30>>      Subjective: Pt states breathing is a little better, but remains sob with mild exertion.  Denies f/c, cp, n/v/d, hemoptysis  Objective: Vitals:   03/28/24 2200 03/29/24 0050 03/29/24 0500 03/29/24 0502  BP:  (!) 103/53  (!) 111/50  Pulse:  (!) 51  (!) 50  Resp:      Temp:  97.8 F (36.6 C)  97.8 F (36.6 C)  TempSrc:  Oral  Oral  SpO2:  92%  97%  Weight: 119.6 kg  120.1 kg   Height:        Intake/Output Summary (Last 24 hours) at 03/29/2024 0911 Last data filed at 03/29/2024 0841 Gross per 24 hour  Intake 240 ml  Output 600 ml  Net -360  ml   Weight change:  Exam:  General:  Pt is alert, follows commands appropriately, not in acute distress HEENT: No icterus, No thrush, No neck mass, [place]/AT Cardiovascular: RRR, S1/S2, no rubs, no gallops Respiratory: bilateral crackles.  Bibasilar wheeze Abdomen: Soft/+BS, non tender, non distended, no guarding Extremities: 2 + LE edema, No lymphangitis, No petechiae, No rashes, no synovitis   Data Reviewed: I have  personally reviewed following labs and imaging studies Basic Metabolic Panel: Recent Labs  Lab 03/28/24 1543 03/29/24 0208  NA 130* 135  K 3.4* 4.0  CL 85* 89*  CO2 33* 36*  GLUCOSE 119* 202*  BUN 11 14  CREATININE 0.76 0.91  CALCIUM  8.7* 8.7*   Liver Function Tests: No results for input(s): "AST", "ALT", "ALKPHOS", "BILITOT", "PROT", "ALBUMIN" in the last 168 hours. No results for input(s): "LIPASE", "AMYLASE" in the last 168 hours. No results for input(s): "AMMONIA" in the last 168 hours. Coagulation Profile: No results for input(s): "INR", "PROTIME" in the last 168 hours. CBC: Recent Labs  Lab 03/28/24 1543 03/29/24 0208  WBC 19.1* 17.0*  NEUTROABS 16.7*  --   HGB 11.4* 11.5*  HCT 35.1* 36.0*  MCV 90.7 92.1  PLT 255 239   Cardiac Enzymes: No results for input(s): "CKTOTAL", "CKMB", "CKMBINDEX", "TROPONINI" in the last 168 hours. BNP: Invalid input(s): "POCBNP" CBG: Recent Labs  Lab 03/28/24 2158 03/29/24 0719  GLUCAP 273* 144*   HbA1C: No results for input(s): "HGBA1C" in the last 72 hours. Urine analysis:    Component Value Date/Time   COLORURINE AMBER (A) 04/04/2021 1338   APPEARANCEUR CLOUDY (A) 04/04/2021 1338   LABSPEC 1.021 04/04/2021 1338   PHURINE 5.0 04/04/2021 1338   GLUCOSEU NEGATIVE 04/04/2021 1338   HGBUR NEGATIVE 04/04/2021 1338   BILIRUBINUR NEGATIVE 04/04/2021 1338   KETONESUR NEGATIVE 04/04/2021 1338   PROTEINUR 30 (A) 04/04/2021 1338   UROBILINOGEN 1.0 06/13/2015 2104   NITRITE NEGATIVE 04/04/2021 1338   LEUKOCYTESUR NEGATIVE 04/04/2021 1338   Sepsis Labs: @LABRCNTIP (procalcitonin:4,lacticidven:4) ) Recent Results (from the past 240 hours)  Resp panel by RT-PCR (RSV, Flu A&B, Covid) Anterior Nasal Swab     Status: None   Collection Time: 03/28/24  3:21 PM   Specimen: Anterior Nasal Swab  Result Value Ref Range Status   SARS Coronavirus 2 by RT PCR NEGATIVE NEGATIVE Final    Comment: (NOTE) SARS-CoV-2 target nucleic acids are  NOT DETECTED.  The SARS-CoV-2 RNA is generally detectable in upper respiratory specimens during the acute phase of infection. The lowest concentration of SARS-CoV-2 viral copies this assay can detect is 138 copies/mL. A negative result does not preclude SARS-Cov-2 infection and should not be used as the sole basis for treatment or other patient management decisions. A negative result may occur with  improper specimen collection/handling, submission of specimen other than nasopharyngeal swab, presence of viral mutation(s) within the areas targeted by this assay, and inadequate number of viral copies(<138 copies/mL). A negative result must be combined with clinical observations, patient history, and epidemiological information. The expected result is Negative.  Fact Sheet for Patients:  BloggerCourse.com  Fact Sheet for Healthcare Providers:  SeriousBroker.it  This test is no t yet approved or cleared by the [place]  FDA and  has been authorized for detection and/or diagnosis of SARS-CoV-2 by FDA under an Emergency Use Authorization (EUA). This EUA will remain  in effect (meaning this test can be used) for the duration of the COVID-19 declaration under Section 564(b)(1) of the Act, 21 U.S.C.section  360bbb-3(b)(1), unless the authorization is terminated  or revoked sooner.       Influenza A by PCR NEGATIVE NEGATIVE Final   Influenza B by PCR NEGATIVE NEGATIVE Final    Comment: (NOTE) The Xpert Xpress SARS-CoV-2/FLU/RSV plus assay is intended as an aid in the diagnosis of influenza from Nasopharyngeal swab specimens and should not be used as a sole basis for treatment. Nasal washings and aspirates are unacceptable for Xpert Xpress SARS-CoV-2/FLU/RSV testing.  Fact Sheet for Patients: BloggerCourse.com  Fact Sheet for Healthcare Providers: SeriousBroker.it  This test is not  yet approved or cleared by the [place]  FDA and has been authorized for detection and/or diagnosis of SARS-CoV-2 by FDA under an Emergency Use Authorization (EUA). This EUA will remain in effect (meaning this test can be used) for the duration of the COVID-19 declaration under Section 564(b)(1) of the Act, 21 U.S.C. section 360bbb-3(b)(1), unless the authorization is terminated or revoked.     Resp Syncytial Virus by PCR NEGATIVE NEGATIVE Final    Comment: (NOTE) Fact Sheet for Patients: BloggerCourse.com  Fact Sheet for Healthcare Providers: SeriousBroker.it  This test is not yet approved or cleared by the [place]  FDA and has been authorized for detection and/or diagnosis of SARS-CoV-2 by FDA under an Emergency Use Authorization (EUA). This EUA will remain in effect (meaning this test can be used) for the duration of the COVID-19 declaration under Section 564(b)(1) of the Act, 21 U.S.C. section 360bbb-3(b)(1), unless the authorization is terminated or revoked.  Performed at [facility], [address] [place], [place] [address]   Culture, blood (routine x 2)     Status: None (Preliminary result)   Collection Time: 03/28/24  3:43 PM   Specimen: BLOOD RIGHT FOREARM  Result Value Ref Range Status   Specimen Description BLOOD RIGHT FOREARM  Final   Special Requests   Final    BOTTLES DRAWN AEROBIC AND ANAEROBIC Blood Culture adequate volume   Culture   Final    NO GROWTH < 24 HOURS Performed at [facility], [address], [place], [place] [address]    Report Status PENDING  Incomplete  Culture, blood (routine x 2)     Status: None (Preliminary result)   Collection Time: 03/28/24  4:00 PM   Specimen: BLOOD LEFT HAND  Result Value Ref Range Status   Specimen Description BLOOD LEFT HAND  Final   Special Requests   Final    BOTTLES DRAWN AEROBIC AND ANAEROBIC Blood Culture adequate volume   Culture   Final    NO  GROWTH < 24 HOURS Performed at [facility], [address] [place], [place] [address]    Report Status PENDING  Incomplete     Scheduled Meds:  arformoterol   15 mcg Nebulization BID   atorvastatin   80 mg Oral Daily   budesonide  (PULMICORT ) nebulizer solution  0.5 mg Nebulization BID   enoxaparin  (LOVENOX ) injection  60 mg Subcutaneous Q24H   furosemide   60 mg Intravenous BID   insulin  aspart  0-20 Units Subcutaneous TID WC   insulin  aspart  0-5 Units Subcutaneous QHS   ipratropium-albuterol   3 mL Nebulization Q6H   potassium chloride   40 mEq Oral BID   [START ON 03/30/2024] predniSONE   40 mg Oral Q breakfast   Continuous Infusions:  azithromycin      cefTRIAXone  (ROCEPHIN )  IV      Procedures/Studies: CT Angio Chest PE W/Cm &/Or Wo Cm Result Date: 03/28/2024 CLINICAL DATA:  Pulmonary embolus suspected with high probability.  Dry cough since yesterday. Shortness of breath today. EXAM: CT ANGIOGRAPHY CHEST WITH CONTRAST TECHNIQUE: Multidetector CT imaging of the chest was performed using the standard protocol during bolus administration of intravenous contrast. Multiplanar CT image reconstructions and MIPs were obtained to evaluate the vascular anatomy. RADIATION DOSE REDUCTION: This exam was performed according to the departmental dose-optimization program which includes automated exposure control, adjustment of the mA and/or kV according to patient size and/or use of iterative reconstruction technique. CONTRAST:  75mL OMNIPAQUE  IOHEXOL  350 MG/ML SOLN COMPARISON:  Chest radiograph 03/28/2024.  CT chest 03/31/2021 FINDINGS: Cardiovascular: Technically adequate study with good opacification of the central and segmental pulmonary arteries. Mild motion artifact. No focal filling defects are demonstrated. No evidence of significant pulmonary embolus. Cardiac enlargement. No pericardial effusions. Normal caliber thoracic aorta. No dissection. Mild aortic calcification. Coronary artery calcification.  Cardiac pacemaker. Mediastinum/Nodes: Thyroid gland is unremarkable. Scattered lymph nodes are not pathologically enlarged, likely reactive. Esophagus is decompressed. Lungs/Pleura: Small bilateral pleural effusions. Atelectasis or consolidation in both lung bases, greater on the right. This could be due to pneumonia in the appropriate clinical setting. Severe diffuse emphysematous changes in the lungs. No pneumothorax. Upper Abdomen: Cholelithiasis.  No acute abnormalities. Musculoskeletal: Prominent degenerative changes in the thoracic spine with bridging osteophytes, possibly ankylosis. No acute bony abnormalities seen. Review of the MIP images confirms the above findings. IMPRESSION: 1. No evidence of significant pulmonary embolus. 2. Small bilateral pleural effusions. 3. Atelectasis or consolidation in both lung bases, possibly pneumonia in the appropriate clinical setting. 4. Aortic atherosclerosis. 5. Cholelithiasis without evidence of acute cholecystitis. Electronically Signed   By: Boyce Byes M.D.   On: 03/28/2024 19:48   DG Chest Port 1 View Result Date: 03/28/2024 CLINICAL DATA:  Shortness of breath. History of heart failure, coronary artery disease, COPD, pacemaker EXAM: PORTABLE CHEST 1 VIEW COMPARISON:  07/30/2023 FINDINGS: Cardiac pacemaker. Shallow inspiration. Cardiac enlargement with mild vascular congestion. Infiltration or atelectasis in both lung bases. Similar appearance to prior study. No pleural effusion or pneumothorax. Mediastinal contours appear intact. Calcification of the aorta. Degenerative changes in the spine. IMPRESSION: Cardiac enlargement with mild vascular congestion. Infiltration or atelectasis in both lung bases. Electronically Signed   By: Boyce Byes M.D.   On: 03/28/2024 17:44    Demaris Fillers, DO  [facility]  If 7PM-7AM, please contact night-coverage [email or web address] Password [email or web address] 03/29/2024, 9:11 AM   LOS: 1 day

## 2024-03-30 DIAGNOSIS — J9601 Acute respiratory failure with hypoxia: Secondary | ICD-10-CM | POA: Diagnosis not present

## 2024-03-30 DIAGNOSIS — J441 Chronic obstructive pulmonary disease with (acute) exacerbation: Secondary | ICD-10-CM | POA: Diagnosis not present

## 2024-03-30 DIAGNOSIS — I5033 Acute on chronic diastolic (congestive) heart failure: Secondary | ICD-10-CM | POA: Diagnosis not present

## 2024-03-30 LAB — BASIC METABOLIC PANEL WITH GFR
Anion gap: 8 (ref 5–15)
BUN: 17 mg/dL (ref 8–23)
CO2: 36 mmol/L — ABNORMAL HIGH (ref 22–32)
Calcium: 8.9 mg/dL (ref 8.9–10.3)
Chloride: 91 mmol/L — ABNORMAL LOW (ref 98–111)
Creatinine, Ser: 0.78 mg/dL (ref 0.61–1.24)
GFR, Estimated: >60 mL/min (ref >60)
Glucose, Bld: 165 mg/dL — ABNORMAL HIGH (ref 70–99)
Potassium: 4.1 mmol/L (ref 3.5–5.1)
Sodium: 135 mmol/L (ref 135–145)

## 2024-03-30 LAB — GLUCOSE, CAPILLARY
Glucose-Capillary: 152 mg/dL — ABNORMAL HIGH (ref 70–99)
Glucose-Capillary: 256 mg/dL — ABNORMAL HIGH (ref 70–99)
Glucose-Capillary: 205 mg/dL — ABNORMAL HIGH (ref 70–99)
Glucose-Capillary: 158 mg/dL — ABNORMAL HIGH (ref 70–99)

## 2024-03-30 LAB — MAGNESIUM: Magnesium: 2.1 mg/dL (ref 1.7–2.4)

## 2024-03-30 LAB — PHOSPHORUS: Phosphorus: 3.3 mg/dL (ref 2.5–4.6)

## 2024-03-30 MED ORDER — ORAL CARE MOUTH RINSE: 15.0000 mL | OROMUCOSAL | Status: DC | PRN

## 2024-03-30 NOTE — Progress Notes (Signed)
 PROGRESS NOTE  Nathan Miller UJW:[id number] DOB: Jan 27, 1951 DOA: 03/28/2024 PCP: [facility]  Brief History:  73 year old male with a history COPD, type 2 diabetes with hyperglycemia and chronic use of insulin , class III obesity, chronic respiratory failure (2 L at baseline), hyperlipidemia and diastolic heart failure presented at least 1 day history of shortness of breath and nonproductive cough.  He has noted some orthopnea.  He feels that his chronic leg edema is about the same as usual.  He quit smoking 20 years ago, but his wife continues to smoke.  He denies any fevers, chills, chest pain, nausea, vomiting or direct abdominal pain.  He denies any medication or melena or hematuria. In the ED, the patient was afebrile and hemodynamically stable.  EMS noted oxygen  saturation in the 80% range.  He was placed on nonrebreather initially.  WBC 19.1, hemoglobin 1.4, platelets 215.  Sodium 130, potassium 3.4, bicarbonate 33, serum creatinine 0.76.  BNP 671.  Troponin 19>> 24.  CTA chest was negative for pulmonary embolus.  There were small bilateral pleural effusions.  There is atelectasis versus consolidation of the lung bases.  There is diffuse emphysematous changes in the bilateral lungs.  The patient was started on IV furosemide , DuoNebs, and Solu-Medrol .  There is manage further evaluation and treatment of his respiratory failure.   Assessment/Plan:  Acute on chronic respiratory failure with hypoxia -Multifactorial: In the setting of COPD/bronchiectasis exacerbation and acute on chronic diastolic heart failure and possible pneumonia -initially on NRB>>weaned to 5L presently>>3.5L -chronically on 2L -wean oxygen  for saturation >92%   Acute on chronic HFpEF -07/30/23 Echo EF 55%, no WMA, G1DD, normal RVF -remains clinically fluid overloaded -continue IV lasix  -1800 cc out yesterday -obtain ReDS vest reading- -holding jardiance  temporarily   COPD  Exacerbation -nearly 60 pack years tobacco, quit 10 yrs ago -continue duoneb -continue solumedrol -continue brovana  and pulmicort  -continue ceftriaxone  and azithro -PCT 0.37   CHB s/p PPM -follow up Dr. Nunzio Belch   Hyperlipidemia -Continue Lipitor .   Type 2 diabetes with hyperglycemia -07/30/23 A1c 5.8 -Continue sliding scale insulin  -Continue holding metformin    Class III obesity -Body mass index is 40.26 kg/m. -lifestyle modification   Essential hypertension -holding coreg  due to soft BPs   Hyponatremia: -due to fluid overload -improving with diuresis   History of PAD -Continue risk factor modification -Status post right BKA -Patient will continue the use of prosthesis to facilitate ambulation.                    Family Communication:  no Family at bedside   Consultants:  none   Code Status:  FULL    DVT Prophylaxis:  [place] Lovenox      Procedures: As Listed in Progress Note Above   Antibiotics: Ceftriaxone  5/30>> Azithro 5/30>>           Subjective: Pt states he is breathing better, but still has orthopnea symptoms.  Denies cp, n/v/d, abd pain  Objective: Vitals:   03/29/24 2016 03/30/24 0451 03/30/24 0830 03/30/24 1430  BP: (!) 153/59 (!) 124/55  (!) 104/44  Pulse: (!) 51 (!) 50  (!) 50  Resp: (!) 22 20  20   Temp: 99.1 F (37.3 C) 97.7 F (36.5 C)  98.7 F (37.1 C)  TempSrc: Oral Oral  Oral  SpO2: 96% 96% 92% 97%  Weight:  120 kg    Height:  Intake/Output Summary (Last 24 hours) at 03/30/2024 1829 Last data filed at 03/30/2024 1500 Gross per 24 hour  Intake 1315.63 ml  Output 1150 ml  Net 165.63 ml   Weight change: 3 kg Exam:  General:  Pt is alert, follows commands appropriately, not in acute distress HEENT: No icterus, No thrush, No neck mass, /AT Cardiovascular: RRR, S1/S2, no rubs, no gallops Respiratory: bibasilar rales.  Bibasilar exp wheeze Abdomen: Soft/+BS, non tender, non distended, no  guarding Extremities: N2 + LEedema, No lymphangitis, No petechiae, No rashes, no synovitis   Data Reviewed: I have personally reviewed following labs and imaging studies Basic Metabolic Panel: Recent Labs  Lab 03/28/24 1543 03/29/24 0208 03/30/24 0327  NA 130* 135 135  K 3.4* 4.0 4.1  CL 85* 89* 91*  CO2 33* 36* 36*  GLUCOSE 119* 202* 165*  BUN 11 14 17   CREATININE 0.76 0.91 0.78  CALCIUM  8.7* 8.7* 8.9  MG  --   --  2.1  PHOS  --   --  3.3   Liver Function Tests: No results for input(s): "AST", "ALT", "ALKPHOS", "BILITOT", "PROT", "ALBUMIN" in the last 168 hours. No results for input(s): "LIPASE", "AMYLASE" in the last 168 hours. No results for input(s): "AMMONIA" in the last 168 hours. Coagulation Profile: No results for input(s): "INR", "PROTIME" in the last 168 hours. CBC: Recent Labs  Lab 03/28/24 1543 03/29/24 0208  WBC 19.1* 17.0*  NEUTROABS 16.7*  --   HGB 11.4* 11.5*  HCT 35.1* 36.0*  MCV 90.7 92.1  PLT 255 239   Cardiac Enzymes: No results for input(s): "CKTOTAL", "CKMB", "CKMBINDEX", "TROPONINI" in the last 168 hours. BNP: Invalid input(s): "POCBNP" CBG: Recent Labs  Lab 03/29/24 1641 03/29/24 2123 03/30/24 0740 03/30/24 1134 03/30/24 1623  GLUCAP 120* 151* 152* 256* 205*   HbA1C: Recent Labs    03/29/24 0208  HGBA1C 5.9*   Urine analysis:    Component Value Date/Time   COLORURINE AMBER (A) 04/04/2021 1338   APPEARANCEUR CLOUDY (A) 04/04/2021 1338   LABSPEC 1.021 04/04/2021 1338   PHURINE 5.0 04/04/2021 1338   GLUCOSEU NEGATIVE 04/04/2021 1338   HGBUR NEGATIVE 04/04/2021 1338   BILIRUBINUR NEGATIVE 04/04/2021 1338   KETONESUR NEGATIVE 04/04/2021 1338   PROTEINUR 30 (A) 04/04/2021 1338   UROBILINOGEN 1.0 06/13/2015 2104   NITRITE NEGATIVE 04/04/2021 1338   LEUKOCYTESUR NEGATIVE 04/04/2021 1338   Sepsis Labs: @LABRCNTIP (procalcitonin:4,lacticidven:4) ) Recent Results (from the past 240 hours)  Resp panel by RT-PCR (RSV, Flu A&B,  Covid) Anterior Nasal Swab     Status: None   Collection Time: 03/28/24  3:21 PM   Specimen: Anterior Nasal Swab  Result Value Ref Range Status   SARS Coronavirus 2 by RT PCR NEGATIVE NEGATIVE Final    Comment: (NOTE) SARS-CoV-2 target nucleic acids are NOT DETECTED.  The SARS-CoV-2 RNA is generally detectable in upper respiratory specimens during the acute phase of infection. The lowest concentration of SARS-CoV-2 viral copies this assay can detect is 138 copies/mL. A negative result does not preclude SARS-Cov-2 infection and should not be used as the sole basis for treatment or other patient management decisions. A negative result may occur with  improper specimen collection/handling, submission of specimen other than nasopharyngeal swab, presence of viral mutation(s) within the areas targeted by this assay, and inadequate number of viral copies(<138 copies/mL). A negative result must be combined with clinical observations, patient history, and epidemiological information. The expected result is Negative.  Fact Sheet for Patients:  BloggerCourse.com  Fact Sheet for Healthcare Providers:  SeriousBroker.it  This test is no t yet approved or cleared by the [place]  FDA and  has been authorized for detection and/or diagnosis of SARS-CoV-2 by FDA under an Emergency Use Authorization (EUA). This EUA will remain  in effect (meaning this test can be used) for the duration of the COVID-19 declaration under Section 564(b)(1) of the Act, 21 U.S.C.section 360bbb-3(b)(1), unless the authorization is terminated  or revoked sooner.       Influenza A by PCR NEGATIVE NEGATIVE Final   Influenza B by PCR NEGATIVE NEGATIVE Final    Comment: (NOTE) The Xpert Xpress SARS-CoV-2/FLU/RSV plus assay is intended as an aid in the diagnosis of influenza from Nasopharyngeal swab specimens and should not be used as a sole basis for treatment. Nasal  washings and aspirates are unacceptable for Xpert Xpress SARS-CoV-2/FLU/RSV testing.  Fact Sheet for Patients: BloggerCourse.com  Fact Sheet for Healthcare Providers: SeriousBroker.it  This test is not yet approved or cleared by the [place]  FDA and has been authorized for detection and/or diagnosis of SARS-CoV-2 by FDA under an Emergency Use Authorization (EUA). This EUA will remain in effect (meaning this test can be used) for the duration of the COVID-19 declaration under Section 564(b)(1) of the Act, 21 U.S.C. section 360bbb-3(b)(1), unless the authorization is terminated or revoked.     Resp Syncytial Virus by PCR NEGATIVE NEGATIVE Final    Comment: (NOTE) Fact Sheet for Patients: BloggerCourse.com  Fact Sheet for Healthcare Providers: SeriousBroker.it  This test is not yet approved or cleared by the [place]  FDA and has been authorized for detection and/or diagnosis of SARS-CoV-2 by FDA under an Emergency Use Authorization (EUA). This EUA will remain in effect (meaning this test can be used) for the duration of the COVID-19 declaration under Section 564(b)(1) of the Act, 21 U.S.C. section 360bbb-3(b)(1), unless the authorization is terminated or revoked.  Performed at [facility], [address], [place], [place] [address]   Culture, blood (routine x 2)     Status: None (Preliminary result)   Collection Time: 03/28/24  3:43 PM   Specimen: BLOOD RIGHT FOREARM  Result Value Ref Range Status   Specimen Description BLOOD RIGHT FOREARM  Final   Special Requests   Final    BOTTLES DRAWN AEROBIC AND ANAEROBIC Blood Culture adequate volume   Culture   Final    NO GROWTH 2 DAYS Performed at [facility], [address] [place], [place] [address]    Report Status PENDING  Incomplete  Culture, blood (routine x 2)     Status: None (Preliminary result)   Collection  Time: 03/28/24  4:00 PM   Specimen: BLOOD LEFT HAND  Result Value Ref Range Status   Specimen Description BLOOD LEFT HAND  Final   Special Requests   Final    BOTTLES DRAWN AEROBIC AND ANAEROBIC Blood Culture adequate volume   Culture   Final    NO GROWTH 2 DAYS Performed at [facility], [address], [place], [place] [address]    Report Status PENDING  Incomplete     Scheduled Meds:  arformoterol   15 mcg Nebulization BID   atorvastatin   80 mg Oral Daily   budesonide  (PULMICORT ) nebulizer solution  0.5 mg Nebulization BID   enoxaparin  (LOVENOX ) injection  60 mg Subcutaneous Q24H   furosemide   60 mg Intravenous BID   insulin  aspart  0-20 Units Subcutaneous TID WC   insulin  aspart  0-5 Units Subcutaneous QHS  ipratropium-albuterol   3 mL Nebulization BID   methylPREDNISolone  (SOLU-MEDROL ) injection  60 mg Intravenous Q12H   potassium chloride   40 mEq Oral BID   Continuous Infusions:  azithromycin  500 mg (03/29/24 2313)   cefTRIAXone  (ROCEPHIN )  IV 2 g (03/29/24 2222)    Procedures/Studies: ECHOCARDIOGRAM COMPLETE Result Date: 03/29/2024    ECHOCARDIOGRAM REPORT   Patient Name:   Nathan Miller Date of Exam: 03/29/2024 Medical Rec #:  [id number]    Height:       68.0 in Accession #:    [id number]   Weight:       264.8 lb Date of Birth:  February 01, 1951   BSA:          2.303 m Patient Age:    72 years     BP:           111/50 mmHg Patient Gender: M            HR:           108 bpm. Exam Location:  [facility] Procedure: 2D Echo, Cardiac Doppler, Color Doppler and Intracardiac            Opacification Agent (Both Spectral and Color Flow Doppler were            utilized during procedure). Indications:    Congestive Heart Failure l50.9  History:        Patient has prior history of Echocardiogram examinations, most                 recent 07/30/2023. CAD, Pacemaker, COPD, Signs/Symptoms:Syncope;                 Risk Factors:Dyslipidemia, Diabetes, Hypertension and Current                 Smoker. Hx of  Marijuana use.  Sonographer:    Denese Finn RCS Referring Phys: [id number] JACOB J STINSON IMPRESSIONS  1. Left ventricular ejection fraction, by estimation, is 60 to 65%. The left ventricle has normal function. The left ventricle has no regional wall motion abnormalities. There is mild left ventricular hypertrophy. Left ventricular diastolic parameters are indeterminate.  2. Right ventricular systolic function is normal. The right ventricular size is normal. Tricuspid regurgitation signal is inadequate for assessing PA pressure.  3. The mitral valve was not well visualized. No evidence of mitral valve regurgitation. No evidence of mitral stenosis.  4. The aortic valve is tricuspid. Aortic valve regurgitation is not visualized. No aortic stenosis is present.  5. The inferior vena cava is dilated in size with >50% respiratory variability, suggesting right atrial pressure of 8 mmHg. FINDINGS  Left Ventricle: Left ventricular ejection fraction, by estimation, is 60 to 65%. The left ventricle has normal function. The left ventricle has no regional wall motion abnormalities. Definity  contrast agent was given IV to delineate the left ventricular  endocardial borders. The left ventricular internal cavity size was normal in size. There is mild left ventricular hypertrophy. Left ventricular diastolic parameters are indeterminate. Right Ventricle: The right ventricular size is normal. Right vetricular wall thickness was not well visualized. Right ventricular systolic function is normal. Tricuspid regurgitation signal is inadequate for assessing PA pressure. Left Atrium: Left atrial size was not well visualized. Right Atrium: Right atrial size was not well visualized. Pericardium: There is no evidence of pericardial effusion. Mitral Valve: The mitral valve was not well visualized. No evidence of mitral valve regurgitation. No evidence of mitral valve stenosis. Tricuspid Valve: The tricuspid valve is  normal in structure. Tricuspid  valve regurgitation is not demonstrated. No evidence of tricuspid stenosis. Aortic Valve: The aortic valve is tricuspid. Aortic valve regurgitation is not visualized. No aortic stenosis is present. Aortic valve mean gradient measures 3.5 mmHg. Aortic valve peak gradient measures 7.4 mmHg. Aortic valve area, by VTI measures 2.48 cm. Pulmonic Valve: The pulmonic valve was not well visualized. Pulmonic valve regurgitation is mild. No evidence of pulmonic stenosis. Aorta: The aortic root is normal in size and structure and the ascending aorta was not well visualized. Venous: The inferior vena cava is dilated in size with greater than 50% respiratory variability, suggesting right atrial pressure of 8 mmHg. IAS/Shunts: The interatrial septum was not well visualized.  LEFT VENTRICLE PLAX 2D LVIDd:         5.90 cm   Diastology LVIDs:         3.60 cm   LV e' medial:    6.48 cm/s LV PW:         1.20 cm   LV E/e' medial:  18.5 LV IVS:        1.30 cm   LV e' lateral:   9.80 cm/s LVOT diam:     2.10 cm   LV E/e' lateral: 12.3 LV SV:         80 LV SV Index:   35 LVOT Area:     3.46 cm  RIGHT VENTRICLE RV S prime:     12.60 cm/s TAPSE (M-mode): 2.4 cm LEFT ATRIUM              Index        RIGHT ATRIUM           Index LA diam:        4.70 cm  2.04 cm/m   RA Area:     27.50 cm LA Vol (A2C):   104.0 ml 45.17 ml/m  RA Volume:   93.40 ml  40.56 ml/m LA Vol (A4C):   140.0 ml 60.80 ml/m LA Biplane Vol: 121.0 ml 52.55 ml/m  AORTIC VALVE AV Area (Vmax):    2.72 cm AV Area (Vmean):   2.72 cm AV Area (VTI):     2.48 cm AV Vmax:           136.39 cm/s AV Vmean:          85.958 cm/s AV VTI:            0.324 m AV Peak Grad:      7.4 mmHg AV Mean Grad:      3.5 mmHg LVOT Vmax:         107.00 cm/s LVOT Vmean:        67.400 cm/s LVOT VTI:          0.232 m LVOT/AV VTI ratio: 0.72  AORTA Ao Root diam: 3.30 cm MITRAL VALVE MV Area (PHT): 4.94 cm     SHUNTS MV Decel Time: 154 msec     Systemic VTI:  0.23 m MV E velocity: 120.00 cm/s   Systemic Diam: 2.10 cm MV A velocity: 84.27 cm/s MV E/A ratio:  1.42 Armida Lander MD Electronically signed by Armida Lander MD Signature Date/Time: 03/29/2024/7:05:45 PM    Final    CT Angio Chest PE W/Cm &/Or Wo Cm Result Date: 03/28/2024 CLINICAL DATA:  Pulmonary embolus suspected with high probability. Dry cough since yesterday. Shortness of breath today. EXAM: CT ANGIOGRAPHY CHEST WITH CONTRAST TECHNIQUE: Multidetector CT imaging of the chest was performed using the standard  protocol during bolus administration of intravenous contrast. Multiplanar CT image reconstructions and MIPs were obtained to evaluate the vascular anatomy. RADIATION DOSE REDUCTION: This exam was performed according to the departmental dose-optimization program which includes automated exposure control, adjustment of the mA and/or kV according to patient size and/or use of iterative reconstruction technique. CONTRAST:  75mL OMNIPAQUE  IOHEXOL  350 MG/ML SOLN COMPARISON:  Chest radiograph 03/28/2024.  CT chest 03/31/2021 FINDINGS: Cardiovascular: Technically adequate study with good opacification of the central and segmental pulmonary arteries. Mild motion artifact. No focal filling defects are demonstrated. No evidence of significant pulmonary embolus. Cardiac enlargement. No pericardial effusions. Normal caliber thoracic aorta. No dissection. Mild aortic calcification. Coronary artery calcification. Cardiac pacemaker. Mediastinum/Nodes: Thyroid gland is unremarkable. Scattered lymph nodes are not pathologically enlarged, likely reactive. Esophagus is decompressed. Lungs/Pleura: Small bilateral pleural effusions. Atelectasis or consolidation in both lung bases, greater on the right. This could be due to pneumonia in the appropriate clinical setting. Severe diffuse emphysematous changes in the lungs. No pneumothorax. Upper Abdomen: Cholelithiasis.  No acute abnormalities. Musculoskeletal: Prominent degenerative changes in the thoracic  spine with bridging osteophytes, possibly ankylosis. No acute bony abnormalities seen. Review of the MIP images confirms the above findings. IMPRESSION: 1. No evidence of significant pulmonary embolus. 2. Small bilateral pleural effusions. 3. Atelectasis or consolidation in both lung bases, possibly pneumonia in the appropriate clinical setting. 4. Aortic atherosclerosis. 5. Cholelithiasis without evidence of acute cholecystitis. Electronically Signed   By: Boyce Byes M.D.   On: 03/28/2024 19:48   DG Chest Port 1 View Result Date: 03/28/2024 CLINICAL DATA:  Shortness of breath. History of heart failure, coronary artery disease, COPD, pacemaker EXAM: PORTABLE CHEST 1 VIEW COMPARISON:  07/30/2023 FINDINGS: Cardiac pacemaker. Shallow inspiration. Cardiac enlargement with mild vascular congestion. Infiltration or atelectasis in both lung bases. Similar appearance to prior study. No pleural effusion or pneumothorax. Mediastinal contours appear intact. Calcification of the aorta. Degenerative changes in the spine. IMPRESSION: Cardiac enlargement with mild vascular congestion. Infiltration or atelectasis in both lung bases. Electronically Signed   By: Boyce Byes M.D.   On: 03/28/2024 17:44    Demaris Fillers, DO  [facility]  If 7PM-7AM, please contact night-coverage [email or web address] Password [email or web address] 03/30/2024, 6:29 PM   LOS: 2 days

## 2024-03-30 NOTE — Plan of Care (Signed)
  Problem: Health Behavior/Discharge Planning: Goal: Ability to manage health-related needs will improve Outcome: Progressing   Problem: Clinical Measurements: Goal: Ability to maintain clinical measurements within normal limits will improve Outcome: Progressing Goal: Will remain free from infection Outcome: Progressing Goal: Diagnostic test results will improve Outcome: Progressing Goal: Respiratory complications will improve Outcome: Progressing   Problem: Elimination: Goal: Will not experience complications related to urinary retention Outcome: Progressing   Problem: Pain Managment: Goal: General experience of comfort will improve and/or be controlled Outcome: Progressing   Problem: Safety: Goal: Ability to remain free from injury will improve Outcome: Progressing   Problem: Skin Integrity: Goal: Risk for impaired skin integrity will decrease Outcome: Progressing   Problem: Activity: Goal: Capacity to carry out activities will improve Outcome: Progressing   Problem: Activity: Goal: Ability to tolerate increased activity will improve Outcome: Progressing Goal: Will verbalize the importance of balancing activity with adequate rest periods Outcome: Progressing   Problem: Respiratory: Goal: Ability to maintain a clear airway will improve Outcome: Progressing Goal: Levels of oxygenation will improve Outcome: Progressing Goal: Ability to maintain adequate ventilation will improve Outcome: Progressing   Problem: Activity: Goal: Ability to tolerate increased activity will improve Outcome: Progressing   Problem: Clinical Measurements: Goal: Ability to maintain a body temperature in the normal range will improve Outcome: Progressing   Problem: Respiratory: Goal: Ability to maintain adequate ventilation will improve Outcome: Progressing   Problem: Coping: Goal: Ability to adjust to condition or change in health will improve Outcome: Progressing   Problem:  Health Behavior/Discharge Planning: Goal: Ability to manage health-related needs will improve Outcome: Progressing   Problem: Metabolic: Goal: Ability to maintain appropriate glucose levels will improve Outcome: Progressing   Problem: Nutritional: Goal: Maintenance of adequate nutrition will improve Outcome: Progressing   Problem: Skin Integrity: Goal: Risk for impaired skin integrity will decrease Outcome: Progressing   Problem: Tissue Perfusion: Goal: Adequacy of tissue perfusion will improve Outcome: Progressing

## 2024-03-30 NOTE — Plan of Care (Signed)
  Problem: Health Behavior/Discharge Planning: Goal: Ability to manage health-related needs will improve Outcome: Progressing   Problem: Education: Goal: Knowledge of General Education information will improve Description: Including pain rating scale, medication(s)/side effects and non-pharmacologic comfort measures Outcome: Not Applicable   Problem: Clinical Measurements: Goal: Ability to maintain clinical measurements within normal limits will improve Outcome: Progressing Goal: Will remain free from infection Outcome: Progressing Goal: Diagnostic test results will improve Outcome: Progressing Goal: Respiratory complications will improve Outcome: Progressing

## 2024-03-31 ENCOUNTER — Encounter (HOSPITAL_COMMUNITY): Payer: Self-pay | Admitting: Family Medicine

## 2024-03-31 DIAGNOSIS — I5033 Acute on chronic diastolic (congestive) heart failure: Secondary | ICD-10-CM | POA: Diagnosis not present

## 2024-03-31 DIAGNOSIS — J441 Chronic obstructive pulmonary disease with (acute) exacerbation: Secondary | ICD-10-CM | POA: Diagnosis not present

## 2024-03-31 DIAGNOSIS — J181 Lobar pneumonia, unspecified organism: Secondary | ICD-10-CM

## 2024-03-31 DIAGNOSIS — J9621 Acute and chronic respiratory failure with hypoxia: Secondary | ICD-10-CM | POA: Diagnosis not present

## 2024-03-31 LAB — BASIC METABOLIC PANEL WITH GFR
Anion gap: 8 (ref 5–15)
BUN: 20 mg/dL (ref 8–23)
CO2: 35 mmol/L — ABNORMAL HIGH (ref 22–32)
Calcium: 8.7 mg/dL — ABNORMAL LOW (ref 8.9–10.3)
Chloride: 93 mmol/L — ABNORMAL LOW (ref 98–111)
Creatinine, Ser: 0.78 mg/dL (ref 0.61–1.24)
GFR, Estimated: >60 mL/min (ref >60)
Glucose, Bld: 163 mg/dL — ABNORMAL HIGH (ref 70–99)
Potassium: 3.9 mmol/L (ref 3.5–5.1)
Sodium: 136 mmol/L (ref 135–145)

## 2024-03-31 LAB — GLUCOSE, CAPILLARY
Glucose-Capillary: 169 mg/dL — ABNORMAL HIGH (ref 70–99)
Glucose-Capillary: 273 mg/dL — ABNORMAL HIGH (ref 70–99)
Glucose-Capillary: 158 mg/dL — ABNORMAL HIGH (ref 70–99)
Glucose-Capillary: 218 mg/dL — ABNORMAL HIGH (ref 70–99)

## 2024-03-31 LAB — MAGNESIUM: Magnesium: 2.1 mg/dL (ref 1.7–2.4)

## 2024-03-31 MED ORDER — AZITHROMYCIN 250 MG PO TABS
500.0000 mg | ORAL_TABLET | Freq: Every day | ORAL | Status: DC
  Administered 2024-03-31: 500 mg via ORAL
  Filled 2024-03-31: qty 2

## 2024-03-31 MED ORDER — PREDNISONE 20 MG PO TABS
60.0000 mg | ORAL_TABLET | Freq: Every day | ORAL | Status: DC
  Administered 2024-04-01: 60 mg via ORAL
  Filled 2024-03-31: qty 3

## 2024-03-31 MED ORDER — FUROSEMIDE 10 MG/ML IJ SOLN
60.0000 mg | Freq: Three times a day (TID) | INTRAMUSCULAR | Status: DC
  Administered 2024-03-31 (×2): 60 mg via INTRAVENOUS
  Filled 2024-03-31 (×3): qty 6

## 2024-03-31 NOTE — Plan of Care (Signed)
   Problem: Safety: Goal: Ability to remain free from injury will improve Outcome: Progressing   Problem: Skin Integrity: Goal: Risk for impaired skin integrity will decrease Outcome: Progressing   Problem: Activity: Goal: Ability to tolerate increased activity will improve Outcome: Progressing

## 2024-03-31 NOTE — Plan of Care (Signed)
  Problem: Clinical Measurements: Goal: Will remain free from infection Outcome: Progressing Goal: Diagnostic test results will improve Outcome: Progressing Goal: Respiratory complications will improve Outcome: Progressing   Problem: Clinical Measurements: Goal: Diagnostic test results will improve Outcome: Progressing   Problem: Clinical Measurements: Goal: Respiratory complications will improve Outcome: Progressing

## 2024-03-31 NOTE — TOC Progression Note (Signed)
 Transition of Care [facility]) - Progression Note    Patient Details  Name: Nathan Miller MRN: [id number] Date of Birth: [birth year]  Transition of Care [facility]) CM/SW Contact  Geraldina Klinefelter, RN Phone Number: 03/31/2024, 4:45 PM  Clinical Narrative:    Heart Failure RN has met c/pt. Printed materials left at bedside. Offered pt HH RN for follow-up, pt declined. Instructed pt that if he changes his mind after dc his PCP can also order HH. Pt verbalized understanding. [employer] [facility] notified of RW order. Pt active c/[employer] home O2.   Expected Discharge Plan: Home w Home Health Services Barriers to Discharge: Continued Medical Work up  Expected Discharge Plan and Services     Post Acute Care Choice: Home Health Living arrangements for the past 2 months: Mobile Home                                       Social Determinants of Health (SDOH) Interventions SDOH Screenings   Food Insecurity: No Food Insecurity (03/31/2024)  Housing: Low Risk  (03/31/2024)  Transportation Needs: No Transportation Needs (03/31/2024)  Utilities: Not At Risk (03/29/2024)  Financial Resource Strain: Low Risk  (03/31/2024)  Physical Activity: Inactive (07/13/2021)   Received from [facility]  Social Connections: Socially Integrated (03/29/2024)  Stress: No Stress Concern Present (07/13/2021)   Received from [facility], [facility]  Tobacco Use: High Risk (03/31/2024)  Health Literacy: Low Risk  (07/13/2021)   Received from [facility], [facility]    Readmission Risk Interventions     No data to display

## 2024-03-31 NOTE — Discharge Summary (Addendum)
 Physician Discharge Summary   Patient: Nathan Miller MRN: [id number] DOB: 08-23-1951  Admit date:     03/28/2024  Discharge date: 04/01/2024  Discharge Physician: Myrtie Atkinson Deslyn Cavenaugh   [facility]   Recommendations at discharge:   Please follow up with primary care provider within 1-2 weeks  Please repeat BMP and CBC in one week    Hospital Course: 73 year old male with a history COPD, type 2 diabetes with hyperglycemia and chronic use of insulin , class III obesity, chronic respiratory failure (2 L at baseline), hyperlipidemia and diastolic heart failure presented at least 1 day history of shortness of breath and nonproductive cough.  He has noted some orthopnea.  He feels that his chronic leg edema is about the same as usual.  He quit smoking 20 years ago, but his wife continues to smoke.  He denies any fevers, chills, chest pain, nausea, vomiting or direct abdominal pain.  He denies any medication or melena or hematuria. In the ED, the patient was afebrile and hemodynamically stable.  EMS noted oxygen  saturation in the 80% range.  He was placed on nonrebreather initially.  WBC 19.1, hemoglobin 1.4, platelets 215.  Sodium 130, potassium 3.4, bicarbonate 33, serum creatinine 0.76.  BNP 671.  Troponin 19>> 24.  CTA chest was negative for pulmonary embolus.  There were small bilateral pleural effusions.  There is atelectasis versus consolidation of the lung bases.  There is diffuse emphysematous changes in the bilateral lungs.  The patient was started on IV furosemide , DuoNebs, and Solu-Medrol .  There is manage further evaluation and treatment of his respiratory failure.  Assessment and Plan: Acute on chronic respiratory failure with hypoxia -Multifactorial: In the setting of COPD/bronchiectasis exacerbation and acute on chronic diastolic heart failure and possible pneumonia -initially on NRB>>weaned to 5L presently>>3.5L>>3L -weaned back to 2L by the time of  dc -chronically on 2L -wean oxygen  for saturation >92%   Acute on chronic HFpEF -07/30/23 Echo EF 55%, no WMA, G1DD, normal RVF -remains clinically fluid overloaded -continue IV lasix >>d/c home with torsemide  40 mg bid as pt endorses compliance with torsemide  without dietary or fluid indiscretion -6/2 ReDS vest reading-20 -holding jardiance  temporarily>>restart after dc   COPD Exacerbation -nearly 60 pack years tobacco, quit 10 yrs ago -continue duoneb -continue solumedrol>>prednisone  taper -continued brovana  and pulmicort  during hospitalization -continued ceftriaxone  and azithro -PCT 0.37   Lobar pneumonia -5/30 CTA chest--no PE;  Atelectasis or consolidation in both lung bases - presented with WBC 19K and dyspnea - continue ceftriaxone  and azithro - d/c home with 3 more days abx   CHB s/p PPM -follow up Dr. Nunzio Belch   Hyperlipidemia -Continue Lipitor .   Type 2 diabetes with hyperglycemia -07/30/23 A1c 5.8 -Continue sliding scale insulin  -Continue holding metformin    Class III obesity -Body mass index is 40.26 kg/m. -lifestyle modification   Essential hypertension -holding coreg  due to soft BPs and bradycardia   Hyponatremia: -due to fluid overload -improving with diuresis   History of PAD -Continue risk factor modification -Status post right BKA -Patient will continue the use of prosthesis to facilitate ambulation.           Consultants: none Procedures performed: none  Disposition: Home Diet recommendation:  Cardiac and Carb modified diet DISCHARGE MEDICATION: Allergies as of 04/01/2024   No Known Allergies      Medication List     STOP taking these medications    carvedilol  25 MG tablet Commonly known as: COREG   TAKE these medications    albuterol  108 (90 Base) MCG/ACT inhaler Commonly known as: VENTOLIN  HFA Inhale 2 puffs into the lungs every 6 (six) hours as needed for wheezing or shortness of breath.   ALPRAZolam 0.5 MG  tablet Commonly known as: XANAX Take 0.5 mg by mouth daily as needed for anxiety.   aspirin  EC 81 MG tablet Take 1 tablet (81 mg total) by mouth daily.   atorvastatin  80 MG tablet Commonly known as: LIPITOR  Take 1 tablet (80 mg total) by mouth daily.   azithromycin  500 MG tablet Commonly known as: ZITHROMAX  Take 1 tablet (500 mg total) by mouth daily.   cefdinir 300 MG capsule Commonly known as: OMNICEF Take 1 capsule (300 mg total) by mouth 2 (two) times daily.   Centrum Adults Tabs Take 1 tablet by mouth daily.   ferrous sulfate  325 (65 FE) MG tablet Take 325 mg by mouth every other day.   metFORMIN  500 MG tablet Commonly known as: GLUCOPHAGE  Take 1 tablet (500 mg total) by mouth 2 (two) times daily with a meal.   nitroGLYCERIN  0.4 MG SL tablet Commonly known as: Nitrostat  Place 1 tablet (0.4 mg total) under the tongue every 5 (five) minutes as needed for chest pain.   potassium chloride  SA 20 MEQ tablet Commonly known as: KLOR-CON  M Take 1 tablet (20 mEq total) by mouth daily.   predniSONE  10 MG tablet Commonly known as: DELTASONE  Take 6 tablets (60 mg total) by mouth daily with breakfast. And decrease by one tablet daily   Spiriva  Respimat 2.5 MCG/ACT Aers Generic drug: Tiotropium Bromide  Monohydrate Inhale 2 puffs into the lungs daily.   torsemide  20 MG tablet Commonly known as: DEMADEX  Take 2 tablets (40 mg total) by mouth 2 (two) times daily. What changed: how much to take               Durable Medical Equipment  (From admission, onward)           Start     Ordered   03/31/24 1644  For home use only DME Walker tall  Once       Comments: tall front wheeled walker.  Question:  Patient needs a walker to treat with the following condition  Answer:  Gait instability   03/31/24 1644            Follow-up Information     [facility]. Go on 04/08/2024.   Specialty: Cardiology Why: Hospital  Follow-Up 04/08/2024 @ 10:15 Please bring all medications to follow-up appointment [facility] @ [facility] Entrance C off [facility] [address], [place], [place] Free Valet Parking at the [employer] account planner information: [employer] [place]  [phone]               Discharge Exam: Cleavon Curls Weights   03/30/24 0451 03/31/24 2130 04/01/24 0505  Weight: 120 kg 119.5 kg 116.9 kg   HEENT:  /AT, No thrush, no icterus CV:  RRR, no rub, no S3, no S4 Lung:  fine bibasilar rales. No wheeze Abd:  soft/+BS, NT Ext:  trace LE edema, no lymphangitis, no synovitis, no rash   Condition at discharge: stable  The results of significant diagnostics from this hospitalization (including imaging, microbiology, ancillary and laboratory) are listed below for reference.   Imaging Studies: ECHOCARDIOGRAM COMPLETE Result Date: 03/29/2024    ECHOCARDIOGRAM REPORT   Patient Name:   ALBEN JEPSEN Date  of Exam: 03/29/2024 Medical Rec #:  [id number]    Height:       68.0 in Accession #:    [id number]   Weight:       264.8 lb Date of Birth:  06-21-1951   BSA:          2.303 m Patient Age:    72 years     BP:           111/50 mmHg Patient Gender: M            HR:           108 bpm. Exam Location:  [facility] Procedure: 2D Echo, Cardiac Doppler, Color Doppler and Intracardiac            Opacification Agent (Both Spectral and Color Flow Doppler were            utilized during procedure). Indications:    Congestive Heart Failure l50.9  History:        Patient has prior history of Echocardiogram examinations, most                 recent 07/30/2023. CAD, Pacemaker, COPD, Signs/Symptoms:Syncope;                 Risk Factors:Dyslipidemia, Diabetes, Hypertension and Current                 Smoker. Hx of Marijuana use.  Sonographer:    Denese Finn RCS Referring Phys: [phone] JACOB J STINSON IMPRESSIONS  1. Left ventricular ejection fraction, by estimation, is 60 to  65%. The left ventricle has normal function. The left ventricle has no regional wall motion abnormalities. There is mild left ventricular hypertrophy. Left ventricular diastolic parameters are indeterminate.  2. Right ventricular systolic function is normal. The right ventricular size is normal. Tricuspid regurgitation signal is inadequate for assessing PA pressure.  3. The mitral valve was not well visualized. No evidence of mitral valve regurgitation. No evidence of mitral stenosis.  4. The aortic valve is tricuspid. Aortic valve regurgitation is not visualized. No aortic stenosis is present.  5. The inferior vena cava is dilated in size with >50% respiratory variability, suggesting right atrial pressure of 8 mmHg. FINDINGS  Left Ventricle: Left ventricular ejection fraction, by estimation, is 60 to 65%. The left ventricle has normal function. The left ventricle has no regional wall motion abnormalities. Definity  contrast agent was given IV to delineate the left ventricular  endocardial borders. The left ventricular internal cavity size was normal in size. There is mild left ventricular hypertrophy. Left ventricular diastolic parameters are indeterminate. Right Ventricle: The right ventricular size is normal. Right vetricular wall thickness was not well visualized. Right ventricular systolic function is normal. Tricuspid regurgitation signal is inadequate for assessing PA pressure. Left Atrium: Left atrial size was not well visualized. Right Atrium: Right atrial size was not well visualized. Pericardium: There is no evidence of pericardial effusion. Mitral Valve: The mitral valve was not well visualized. No evidence of mitral valve regurgitation. No evidence of mitral valve stenosis. Tricuspid Valve: The tricuspid valve is normal in structure. Tricuspid valve regurgitation is not demonstrated. No evidence of tricuspid stenosis. Aortic Valve: The aortic valve is tricuspid. Aortic valve regurgitation is not  visualized. No aortic stenosis is present. Aortic valve mean gradient measures 3.5 mmHg. Aortic valve peak gradient measures 7.4 mmHg. Aortic valve area, by VTI measures 2.48 cm. Pulmonic Valve: The pulmonic valve was not well visualized. Pulmonic valve regurgitation is mild.  No evidence of pulmonic stenosis. Aorta: The aortic root is normal in size and structure and the ascending aorta was not well visualized. Venous: The inferior vena cava is dilated in size with greater than 50% respiratory variability, suggesting right atrial pressure of 8 mmHg. IAS/Shunts: The interatrial septum was not well visualized.  LEFT VENTRICLE PLAX 2D LVIDd:         5.90 cm   Diastology LVIDs:         3.60 cm   LV e' medial:    6.48 cm/s LV PW:         1.20 cm   LV E/e' medial:  18.5 LV IVS:        1.30 cm   LV e' lateral:   9.80 cm/s LVOT diam:     2.10 cm   LV E/e' lateral: 12.3 LV SV:         80 LV SV Index:   35 LVOT Area:     3.46 cm  RIGHT VENTRICLE RV S prime:     12.60 cm/s TAPSE (M-mode): 2.4 cm LEFT ATRIUM              Index        RIGHT ATRIUM           Index LA diam:        4.70 cm  2.04 cm/m   RA Area:     27.50 cm LA Vol (A2C):   104.0 ml 45.17 ml/m  RA Volume:   93.40 ml  40.56 ml/m LA Vol (A4C):   140.0 ml 60.80 ml/m LA Biplane Vol: 121.0 ml 52.55 ml/m  AORTIC VALVE AV Area (Vmax):    2.72 cm AV Area (Vmean):   2.72 cm AV Area (VTI):     2.48 cm AV Vmax:           136.39 cm/s AV Vmean:          85.958 cm/s AV VTI:            0.324 m AV Peak Grad:      7.4 mmHg AV Mean Grad:      3.5 mmHg LVOT Vmax:         107.00 cm/s LVOT Vmean:        67.400 cm/s LVOT VTI:          0.232 m LVOT/AV VTI ratio: 0.72  AORTA Ao Root diam: 3.30 cm MITRAL VALVE MV Area (PHT): 4.94 cm     SHUNTS MV Decel Time: 154 msec     Systemic VTI:  0.23 m MV E velocity: 120.00 cm/s  Systemic Diam: 2.10 cm MV A velocity: 84.27 cm/s MV E/A ratio:  1.42 Armida Lander MD Electronically signed by Armida Lander MD Signature Date/Time:  03/29/2024/7:05:45 PM    Final    CT Angio Chest PE W/Cm &/Or Wo Cm Result Date: 03/28/2024 CLINICAL DATA:  Pulmonary embolus suspected with high probability. Dry cough since yesterday. Shortness of breath today. EXAM: CT ANGIOGRAPHY CHEST WITH CONTRAST TECHNIQUE: Multidetector CT imaging of the chest was performed using the standard protocol during bolus administration of intravenous contrast. Multiplanar CT image reconstructions and MIPs were obtained to evaluate the vascular anatomy. RADIATION DOSE REDUCTION: This exam was performed according to the departmental dose-optimization program which includes automated exposure control, adjustment of the mA and/or kV according to patient size and/or use of iterative reconstruction technique. CONTRAST:  75mL OMNIPAQUE  IOHEXOL  350 MG/ML SOLN COMPARISON:  Chest radiograph 03/28/2024.  CT  chest 03/31/2021 FINDINGS: Cardiovascular: Technically adequate study with good opacification of the central and segmental pulmonary arteries. Mild motion artifact. No focal filling defects are demonstrated. No evidence of significant pulmonary embolus. Cardiac enlargement. No pericardial effusions. Normal caliber thoracic aorta. No dissection. Mild aortic calcification. Coronary artery calcification. Cardiac pacemaker. Mediastinum/Nodes: Thyroid gland is unremarkable. Scattered lymph nodes are not pathologically enlarged, likely reactive. Esophagus is decompressed. Lungs/Pleura: Small bilateral pleural effusions. Atelectasis or consolidation in both lung bases, greater on the right. This could be due to pneumonia in the appropriate clinical setting. Severe diffuse emphysematous changes in the lungs. No pneumothorax. Upper Abdomen: Cholelithiasis.  No acute abnormalities. Musculoskeletal: Prominent degenerative changes in the thoracic spine with bridging osteophytes, possibly ankylosis. No acute bony abnormalities seen. Review of the MIP images confirms the above findings. IMPRESSION: 1.  No evidence of significant pulmonary embolus. 2. Small bilateral pleural effusions. 3. Atelectasis or consolidation in both lung bases, possibly pneumonia in the appropriate clinical setting. 4. Aortic atherosclerosis. 5. Cholelithiasis without evidence of acute cholecystitis. Electronically Signed   By: Boyce Byes M.D.   On: 03/28/2024 19:48   DG Chest Port 1 View Result Date: 03/28/2024 CLINICAL DATA:  Shortness of breath. History of heart failure, coronary artery disease, COPD, pacemaker EXAM: PORTABLE CHEST 1 VIEW COMPARISON:  07/30/2023 FINDINGS: Cardiac pacemaker. Shallow inspiration. Cardiac enlargement with mild vascular congestion. Infiltration or atelectasis in both lung bases. Similar appearance to prior study. No pleural effusion or pneumothorax. Mediastinal contours appear intact. Calcification of the aorta. Degenerative changes in the spine. IMPRESSION: Cardiac enlargement with mild vascular congestion. Infiltration or atelectasis in both lung bases. Electronically Signed   By: Boyce Byes M.D.   On: 03/28/2024 17:44    Microbiology: Results for orders placed or performed during the hospital encounter of 03/28/24  Resp panel by RT-PCR (RSV, Flu A&B, Covid) Anterior Nasal Swab     Status: None   Collection Time: 03/28/24  3:21 PM   Specimen: Anterior Nasal Swab  Result Value Ref Range Status   SARS Coronavirus 2 by RT PCR NEGATIVE NEGATIVE Final    Comment: (NOTE) SARS-CoV-2 target nucleic acids are NOT DETECTED.  The SARS-CoV-2 RNA is generally detectable in upper respiratory specimens during the acute phase of infection. The lowest concentration of SARS-CoV-2 viral copies this assay can detect is 138 copies/mL. A negative result does not preclude SARS-Cov-2 infection and should not be used as the sole basis for treatment or other patient management decisions. A negative result may occur with  improper specimen collection/handling, submission of specimen other than  nasopharyngeal swab, presence of viral mutation(s) within the areas targeted by this assay, and inadequate number of viral copies(<138 copies/mL). A negative result must be combined with clinical observations, patient history, and epidemiological information. The expected result is Negative.  Fact Sheet for Patients:  BloggerCourse.com  Fact Sheet for Healthcare Providers:  SeriousBroker.it  This test is no t yet approved or cleared by the [place]  FDA and  has been authorized for detection and/or diagnosis of SARS-CoV-2 by FDA under an Emergency Use Authorization (EUA). This EUA will remain  in effect (meaning this test can be used) for the duration of the COVID-19 declaration under Section 564(b)(1) of the Act, 21 U.S.C.section 360bbb-3(b)(1), unless the authorization is terminated  or revoked sooner.       Influenza A by PCR NEGATIVE NEGATIVE Final   Influenza B by PCR NEGATIVE NEGATIVE Final    Comment: (NOTE) The Xpert Xpress SARS-CoV-2/FLU/RSV plus assay  is intended as an aid in the diagnosis of influenza from Nasopharyngeal swab specimens and should not be used as a sole basis for treatment. Nasal washings and aspirates are unacceptable for Xpert Xpress SARS-CoV-2/FLU/RSV testing.  Fact Sheet for Patients: BloggerCourse.com  Fact Sheet for Healthcare Providers: SeriousBroker.it  This test is not yet approved or cleared by the [place]  FDA and has been authorized for detection and/or diagnosis of SARS-CoV-2 by FDA under an Emergency Use Authorization (EUA). This EUA will remain in effect (meaning this test can be used) for the duration of the COVID-19 declaration under Section 564(b)(1) of the Act, 21 U.S.C. section 360bbb-3(b)(1), unless the authorization is terminated or revoked.     Resp Syncytial Virus by PCR NEGATIVE NEGATIVE Final    Comment:  (NOTE) Fact Sheet for Patients: BloggerCourse.com  Fact Sheet for Healthcare Providers: SeriousBroker.it  This test is not yet approved or cleared by the [place]  FDA and has been authorized for detection and/or diagnosis of SARS-CoV-2 by FDA under an Emergency Use Authorization (EUA). This EUA will remain in effect (meaning this test can be used) for the duration of the COVID-19 declaration under Section 564(b)(1) of the Act, 21 U.S.C. section 360bbb-3(b)(1), unless the authorization is terminated or revoked.  Performed at [facility], [address], [place], [place] [address]   Culture, blood (routine x 2)     Status: None (Preliminary result)   Collection Time: 03/28/24  3:43 PM   Specimen: BLOOD RIGHT FOREARM  Result Value Ref Range Status   Specimen Description BLOOD RIGHT FOREARM  Final   Special Requests   Final    BOTTLES DRAWN AEROBIC AND ANAEROBIC Blood Culture adequate volume   Culture   Final    NO GROWTH 4 DAYS Performed at [facility], [address], [place], [place] [address]    Report Status PENDING  Incomplete  Culture, blood (routine x 2)     Status: None (Preliminary result)   Collection Time: 03/28/24  4:00 PM   Specimen: BLOOD LEFT HAND  Result Value Ref Range Status   Specimen Description BLOOD LEFT HAND  Final   Special Requests   Final    BOTTLES DRAWN AEROBIC AND ANAEROBIC Blood Culture adequate volume   Culture   Final    NO GROWTH 4 DAYS Performed at [facility], [address], [place], [place] [address]    Report Status PENDING  Incomplete    Labs: CBC: Recent Labs  Lab 03/28/24 1543 03/29/24 0208  WBC 19.1* 17.0*  NEUTROABS 16.7*  --   HGB 11.4* 11.5*  HCT 35.1* 36.0*  MCV 90.7 92.1  PLT 255 239   Basic Metabolic Panel: Recent Labs  Lab 03/28/24 1543 03/29/24 0208 03/30/24 0327 03/31/24 0227 04/01/24 0441  NA 130* 135 135 136 134*  K 3.4* 4.0 4.1 3.9 4.1  CL 85* 89*  91* 93* 91*  CO2 33* 36* 36* 35* 34*  GLUCOSE 119* 202* 165* 163* 129*  BUN 11 14 17 20  25*  CREATININE 0.76 0.91 0.78 0.78 0.85  CALCIUM  8.7* 8.7* 8.9 8.7* 8.7*  MG  --   --  2.1 2.1 2.3  PHOS  --   --  3.3  --   --    Liver Function Tests: No results for input(s): "AST", "ALT", "ALKPHOS", "BILITOT", "PROT", "ALBUMIN" in the last 168 hours. CBG: Recent Labs  Lab 03/31/24 0719 03/31/24 1111 03/31/24 1607 03/31/24 2023 04/01/24 0717  GLUCAP 169* 273* 158* 218* 131*  Discharge time spent: greater than 30 minutes.  Signed: Demaris Fillers, MD [facility] 04/01/2024

## 2024-03-31 NOTE — Discharge Instructions (Signed)
 [facility] [employer] - DME provider [address], [place], [place] [address] Phone: [phone]

## 2024-03-31 NOTE — Progress Notes (Signed)
 Heart Failure Nurse Navigator Progress Note  PCP: [facility] PCP-Cardiologist: Eilleen Grates, MD Electrophysiologist: Efraim Grange, MD Admission Diagnosis: Acute respiratory failure with hypoxia (HCC) Acute on chronic congestive heart failure, unspecified heart failure (HCC) Community acquired pneumonia, unspecified laterally  *Heart Failure Education reviewed remotely via telephone to patient in Room A306 @ [facility]. 2 Patient identifiers used prior to telephone interview.  Admitted from: Home  Presentation:   Nathan Miller presented with shortness of breath, nonproductive cough, chills.  Could not sleep due to coughing and shortness of breath.  EMS found patient with Sats 80% and placed on nonrebreather and given DuoNeB.  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  Denied chest pain, abdominal pain, vomiting or diarrhea.  No recent sick contacts or recent travel.  Hx: COPD & CHF, pacemaker and diabetic. BNP 671.0. HS-Troponin 19.Chest x:ray: Cardiac enlargement with mild vascular congestion.  Infiltration or atelectasis in both lung bases.  ECHO/ LVEF: 60-65%Mild LV hypertrophy. LV diastolic parameters are indeterminate. Clinical Course:  Past Medical History:  Diagnosis Date   (HFpEF) heart failure with preserved ejection fraction (HCC)    CAD (coronary artery disease)    NSTEMI in 2001. Cardiac cath showed: LAD: 40% proximal, LCX: 70% mid, RCA thrombotic occlusion in mid segment. s/p PCI and 2 overlapped BMSs to RCA.    Complete heart block (HCC)    COPD (chronic obstructive pulmonary disease) (HCC)    Diabetes mellitus    Hypertension    Osteomyelitis (HCC)    Presence of permanent cardiac pacemaker      Social History   Socioeconomic History   Marital status: Married    Spouse name: Not on file   Number of children: Not on file   Years of education: Not  on file   Highest education level: Not on file  Occupational History   Not on file  Tobacco Use   Smoking status: Former    Current packs/day: 0.00    Average packs/day: 0.3 packs/day for 40.0 years (12.0 ttl pk-yrs)    Types: Cigarettes    Start date: 10/16/1968    Quit date: 10/17/2007    Years since quitting: 16.4   Smokeless tobacco: Current    Types: Snuff  Vaping Use   Vaping status: Never Used  Substance and Sexual Activity   Alcohol use: Not Currently    Alcohol/week: 42.0 standard drinks of alcohol    Types: 42 Cans of beer per week    Comment: beer   Drug use: No   Sexual activity: Not on file  Other Topics Concern   Not on file  Social History Narrative   Not on file   Social Drivers of Health   Financial Resource Strain: Low Risk  (06/28/2021)   Received from [facility]   Overall Financial Resource Strain (CARDIA)  Food Insecurity: No Food Insecurity (03/29/2024)   Hunger Vital Sign    Worried About Running Out of Food in the Last Year: Never true    Ran Out of Food in the Last Year: Never true  Transportation Needs: No Transportation Needs (03/29/2024)   PRAPARE - Administrator, Civil Service (Medical): No    Lack of Transportation (Non-Medical): No  Physical Activity: Inactive (07/13/2021)   [facility]   Exercise Vital Sign  Stress: No Stress Concern Present (07/13/2021)   Received  [facility]   [facility] - Occupational Stress Questionnaire    Feeling of Stress : Not at all  Social Connections: Socially Integrated (03/29/2024)   Social Connection and Isolation Panel [NHANES]    Frequency of Communication with Friends and Family: Three times a week    Frequency of Social Gatherings with Friends and Family: More than three times a week    Attends Religious Services: More than 4 times per year    Active Member of Clubs or Organizations: Yes    Attends Museum/gallery exhibitions officer: More than 4 times per year    Marital Status: Married   Water engineer and Provision:  Detailed education and instructions provided on heart failure disease management including the following:  Signs and symptoms of Heart Failure When to call the physician Importance of daily weights Low sodium diet Fluid restriction Medication management Anticipated future follow-up appointments  Patient education given on each of the above topics.  Patient acknowledges understanding via teach back method and acceptance of all instructions.  Education Materials:  "Living Better With Heart Failure" Booklet, HF zone tool, & Daily Weight Tracker Tool.  Patient has scale at home: Yes Patient has pill box at home: Yes    High Risk Criteria for Readmission and/or Poor Patient Outcomes: Heart failure hospital admissions (last 6 months): 1  No Show rate: 12% Difficult social situation:None Demonstrates medication adherence: Yes.  Uses a box and has his own system set up. Primary Language: English Literacy level: 12 th Grade  Barriers of Care:    Is not able to drive anymore because of right foot amputation.  His wife or daughter drives him to appointments.  Considerations/Referrals:   Referral made to Heart Failure Pharmacist Stewardship: Yes Referral made to Heart Failure CSW/NCM TOC: No Referral made to Heart & Vascular TOC clinic: Yes. 04/08/24 @ 10:15 AM Arlin Benes [facility].    Items for Follow-up on DC/TOC: Diet & Fluid Restrictions Daily Weights-has not been performing daily weights consistently. Only one in awhile. Tobacco product cessation-patient chews tobacco Continued Heart Failure Education  Celedonio Coil, RN, BSN [facility] Heart Failure Navigator Secure Chat Only

## 2024-03-31 NOTE — Progress Notes (Signed)
 Mobility Specialist Progress Note:    03/31/24 1027  Mobility  Activity Ambulated with assistance in room  Level of Assistance Standby assist, set-up cues, supervision of patient - no hands on  Assistive Device Front wheel walker;None (Prosthetic)  Distance Ambulated (ft) 25 ft  Range of Motion/Exercises Active;All extremities  Activity Response Tolerated well  Mobility Referral Yes  Mobility visit 1 Mobility  Mobility Specialist Start Time (ACUTE ONLY) 1017  Mobility Specialist Stop Time (ACUTE ONLY) 1027  Mobility Specialist Time Calculation (min) (ACUTE ONLY) 10 min   Pt received in room with wife. Required SBA to stand and ambulate with RW and prosthetic. Tolerated well, wife helps pt ambulate. Left pt sitting in chair, all needs met.   Glinda Lapping Mobility Specialist Please contact via Special educational needs teacher or  Rehab office at [phone]

## 2024-03-31 NOTE — Progress Notes (Addendum)
 PROGRESS NOTE  RODERT HINCH WUJ:[id number] DOB: August 09, 1951 DOA: 03/28/2024 PCP: [facility]  Brief History:  73 year old male with a history COPD, type 2 diabetes with hyperglycemia and chronic use of insulin , class III obesity, chronic respiratory failure (2 L at baseline), hyperlipidemia and diastolic heart failure presented at least 1 day history of shortness of breath and nonproductive cough.  He has noted some orthopnea.  He feels that his chronic leg edema is about the same as usual.  He quit smoking 20 years ago, but his wife continues to smoke.  He denies any fevers, chills, chest pain, nausea, vomiting or direct abdominal pain.  He denies any medication or melena or hematuria. In the ED, the patient was afebrile and hemodynamically stable.  EMS noted oxygen  saturation in the 80% range.  He was placed on nonrebreather initially.  WBC 19.1, hemoglobin 1.4, platelets 215.  Sodium 130, potassium 3.4, bicarbonate 33, serum creatinine 0.76.  BNP 671.  Troponin 19>> 24.  CTA chest was negative for pulmonary embolus.  There were small bilateral pleural effusions.  There is atelectasis versus consolidation of the lung bases.  There is diffuse emphysematous changes in the bilateral lungs.  The patient was started on IV furosemide , DuoNebs, and Solu-Medrol .  There is manage further evaluation and treatment of his respiratory failure.   Assessment/Plan: Acute on chronic respiratory failure with hypoxia -Multifactorial: In the setting of COPD/bronchiectasis exacerbation and acute on chronic diastolic heart failure and possible pneumonia -initially on NRB>>weaned to 5L presently>>3.5L>>3L -chronically on 2L -wean oxygen  for saturation >92%   Acute on chronic HFpEF -07/30/23 Echo EF 55%, no WMA, G1DD, normal RVF -remains clinically fluid overloaded -continue IV lasix  -1700 cc out yesterday -obtain ReDS vest reading- -holding jardiance  temporarily   COPD  Exacerbation -nearly 60 pack years tobacco, quit 10 yrs ago -continue duoneb -continue solumedrol>>prednisone  -continue brovana  and pulmicort  -continue ceftriaxone  and azithro -PCT 0.37  Lobar pneumonia -5/30 CTA chest--no PE;  Atelectasis or consolidation in both lung bases - presented with WBC 19K and dyspnea - continue ceftriaxone  and azithro   CHB s/p PPM -follow up Dr. Nunzio Belch   Hyperlipidemia -Continue Lipitor .   Type 2 diabetes with hyperglycemia -07/30/23 A1c 5.8 -Continue sliding scale insulin  -Continue holding metformin    Class III obesity -Body mass index is 40.26 kg/m. -lifestyle modification   Essential hypertension -holding coreg  due to soft BPs   Hyponatremia: -due to fluid overload -improving with diuresis   History of PAD -Continue risk factor modification -Status post right BKA -Patient will continue the use of prosthesis to facilitate ambulation.                    Family Communication:  no Family at bedside   Consultants:  none   Code Status:  FULL    DVT Prophylaxis:  [place] Lovenox      Procedures: As Listed in Progress Note Above   Antibiotics: Ceftriaxone  5/30>> Azithro 5/30>>      Subjective: Pt states he is continuing to breath better.  Denies f/c, cp, n/v/d, abd pain  Objective: Vitals:   03/31/24 0632 03/31/24 0703 03/31/24 0915 03/31/24 1330  BP:    (!) 160/69  Pulse:    (!) 51  Resp:   (!) 25   Temp:    98.2 F (36.8 C)  TempSrc:      SpO2:  93%  96%  Weight: 119.5 kg  Height:        Intake/Output Summary (Last 24 hours) at 03/31/2024 1530 Last data filed at 03/31/2024 0919 Gross per 24 hour  Intake 240 ml  Output 1200 ml  Net -960 ml   Weight change: -0.5 kg Exam:  General:  Pt is alert, follows commands appropriately, not in acute distress HEENT: No icterus, No thrush, No neck mass, [place]/AT Cardiovascular: RRR, S1/S2, no rubs, no gallops Respiratory: fine bibasilar crackles . No wheeze Abdomen:  Soft/+BS, non tender, non distended, no guarding Extremities: 1 + LE edema, No lymphangitis, No petechiae, No rashes, no synovitis   Data Reviewed: I have personally reviewed following labs and imaging studies Basic Metabolic Panel: Recent Labs  Lab 03/28/24 1543 03/29/24 0208 03/30/24 0327 03/31/24 0227  NA 130* 135 135 136  K 3.4* 4.0 4.1 3.9  CL 85* 89* 91* 93*  CO2 33* 36* 36* 35*  GLUCOSE 119* 202* 165* 163*  BUN 11 14 17 20   CREATININE 0.76 0.91 0.78 0.78  CALCIUM  8.7* 8.7* 8.9 8.7*  MG  --   --  2.1 2.1  PHOS  --   --  3.3  --    Liver Function Tests: No results for input(s): "AST", "ALT", "ALKPHOS", "BILITOT", "PROT", "ALBUMIN" in the last 168 hours. No results for input(s): "LIPASE", "AMYLASE" in the last 168 hours. No results for input(s): "AMMONIA" in the last 168 hours. Coagulation Profile: No results for input(s): "INR", "PROTIME" in the last 168 hours. CBC: Recent Labs  Lab 03/28/24 1543 03/29/24 0208  WBC 19.1* 17.0*  NEUTROABS 16.7*  --   HGB 11.4* 11.5*  HCT 35.1* 36.0*  MCV 90.7 92.1  PLT 255 239   Cardiac Enzymes: No results for input(s): "CKTOTAL", "CKMB", "CKMBINDEX", "TROPONINI" in the last 168 hours. BNP: Invalid input(s): "POCBNP" CBG: Recent Labs  Lab 03/30/24 1134 03/30/24 1623 03/30/24 2039 03/31/24 0719 03/31/24 1111  GLUCAP 256* 205* 158* 169* 273*   HbA1C: Recent Labs    03/29/24 0208  HGBA1C 5.9*   Urine analysis:    Component Value Date/Time   COLORURINE AMBER (A) 04/04/2021 1338   APPEARANCEUR CLOUDY (A) 04/04/2021 1338   LABSPEC 1.021 04/04/2021 1338   PHURINE 5.0 04/04/2021 1338   GLUCOSEU NEGATIVE 04/04/2021 1338   HGBUR NEGATIVE 04/04/2021 1338   BILIRUBINUR NEGATIVE 04/04/2021 1338   KETONESUR NEGATIVE 04/04/2021 1338   PROTEINUR 30 (A) 04/04/2021 1338   UROBILINOGEN 1.0 06/13/2015 2104   NITRITE NEGATIVE 04/04/2021 1338   LEUKOCYTESUR NEGATIVE 04/04/2021 1338   Sepsis  Labs: @LABRCNTIP (procalcitonin:4,lacticidven:4) ) Recent Results (from the past 240 hours)  Resp panel by RT-PCR (RSV, Flu A&B, Covid) Anterior Nasal Swab     Status: None   Collection Time: 03/28/24  3:21 PM   Specimen: Anterior Nasal Swab  Result Value Ref Range Status   SARS Coronavirus 2 by RT PCR NEGATIVE NEGATIVE Final    Comment: (NOTE) SARS-CoV-2 target nucleic acids are NOT DETECTED.  The SARS-CoV-2 RNA is generally detectable in upper respiratory specimens during the acute phase of infection. The lowest concentration of SARS-CoV-2 viral copies this assay can detect is 138 copies/mL. A negative result does not preclude SARS-Cov-2 infection and should not be used as the sole basis for treatment or other patient management decisions. A negative result may occur with  improper specimen collection/handling, submission of specimen other than nasopharyngeal swab, presence of viral mutation(s) within the areas targeted by this assay, and inadequate number of viral copies(<138 copies/mL). A negative result must  be combined with clinical observations, patient history, and epidemiological information. The expected result is Negative.  Fact Sheet for Patients:  BloggerCourse.com  Fact Sheet for Healthcare Providers:  SeriousBroker.it  This test is no t yet approved or cleared by the [place]  FDA and  has been authorized for detection and/or diagnosis of SARS-CoV-2 by FDA under an Emergency Use Authorization (EUA). This EUA will remain  in effect (meaning this test can be used) for the duration of the COVID-19 declaration under Section 564(b)(1) of the Act, 21 U.S.C.section 360bbb-3(b)(1), unless the authorization is terminated  or revoked sooner.       Influenza A by PCR NEGATIVE NEGATIVE Final   Influenza B by PCR NEGATIVE NEGATIVE Final    Comment: (NOTE) The Xpert Xpress SARS-CoV-2/FLU/RSV plus assay is intended as an  aid in the diagnosis of influenza from Nasopharyngeal swab specimens and should not be used as a sole basis for treatment. Nasal washings and aspirates are unacceptable for Xpert Xpress SARS-CoV-2/FLU/RSV testing.  Fact Sheet for Patients: BloggerCourse.com  Fact Sheet for Healthcare Providers: SeriousBroker.it  This test is not yet approved or cleared by the [place]  FDA and has been authorized for detection and/or diagnosis of SARS-CoV-2 by FDA under an Emergency Use Authorization (EUA). This EUA will remain in effect (meaning this test can be used) for the duration of the COVID-19 declaration under Section 564(b)(1) of the Act, 21 U.S.C. section 360bbb-3(b)(1), unless the authorization is terminated or revoked.     Resp Syncytial Virus by PCR NEGATIVE NEGATIVE Final    Comment: (NOTE) Fact Sheet for Patients: BloggerCourse.com  Fact Sheet for Healthcare Providers: SeriousBroker.it  This test is not yet approved or cleared by the [place]  FDA and has been authorized for detection and/or diagnosis of SARS-CoV-2 by FDA under an Emergency Use Authorization (EUA). This EUA will remain in effect (meaning this test can be used) for the duration of the COVID-19 declaration under Section 564(b)(1) of the Act, 21 U.S.C. section 360bbb-3(b)(1), unless the authorization is terminated or revoked.  Performed at [facility], [address], [place], [place] [address]   Culture, blood (routine x 2)     Status: None (Preliminary result)   Collection Time: 03/28/24  3:43 PM   Specimen: BLOOD RIGHT FOREARM  Result Value Ref Range Status   Specimen Description BLOOD RIGHT FOREARM  Final   Special Requests   Final    BOTTLES DRAWN AEROBIC AND ANAEROBIC Blood Culture adequate volume   Culture   Final    NO GROWTH 3 DAYS Performed at [facility], [address], [place], [place]  [address]    Report Status PENDING  Incomplete  Culture, blood (routine x 2)     Status: None (Preliminary result)   Collection Time: 03/28/24  4:00 PM   Specimen: BLOOD LEFT HAND  Result Value Ref Range Status   Specimen Description BLOOD LEFT HAND  Final   Special Requests   Final    BOTTLES DRAWN AEROBIC AND ANAEROBIC Blood Culture adequate volume   Culture   Final    NO GROWTH 3 DAYS Performed at [facility], [address], [place], [place] [address]    Report Status PENDING  Incomplete     Scheduled Meds:  arformoterol   15 mcg Nebulization BID   atorvastatin   80 mg Oral Daily   budesonide  (PULMICORT ) nebulizer solution  0.5 mg Nebulization BID   enoxaparin  (LOVENOX ) injection  60 mg Subcutaneous Q24H   furosemide   60 mg  Intravenous Q8H   insulin  aspart  0-20 Units Subcutaneous TID WC   insulin  aspart  0-5 Units Subcutaneous QHS   ipratropium-albuterol   3 mL Nebulization BID   methylPREDNISolone  (SOLU-MEDROL ) injection  60 mg Intravenous Q12H   potassium chloride   40 mEq Oral BID   Continuous Infusions:  azithromycin  Stopped (03/30/24 2355)   cefTRIAXone  (ROCEPHIN )  IV Stopped (03/31/24 0037)    Procedures/Studies: ECHOCARDIOGRAM COMPLETE Result Date: 03/29/2024    ECHOCARDIOGRAM REPORT   Patient Name:   SHUAYB SCHEPERS Date of Exam: 03/29/2024 Medical Rec #:  [id number]    Height:       68.0 in Accession #:    [id number]   Weight:       264.8 lb Date of Birth:  03-25-1951   BSA:          2.303 m Patient Age:    72 years     BP:           111/50 mmHg Patient Gender: M            HR:           108 bpm. Exam Location:  [facility] Procedure: 2D Echo, Cardiac Doppler, Color Doppler and Intracardiac            Opacification Agent (Both Spectral and Color Flow Doppler were            utilized during procedure). Indications:    Congestive Heart Failure l50.9  History:        Patient has prior history of Echocardiogram examinations, most                 recent 07/30/2023. CAD, Pacemaker,  COPD, Signs/Symptoms:Syncope;                 Risk Factors:Dyslipidemia, Diabetes, Hypertension and Current                 Smoker. Hx of Marijuana use.  Sonographer:    Denese Finn RCS Referring Phys: [phone] JACOB J STINSON IMPRESSIONS  1. Left ventricular ejection fraction, by estimation, is 60 to 65%. The left ventricle has normal function. The left ventricle has no regional wall motion abnormalities. There is mild left ventricular hypertrophy. Left ventricular diastolic parameters are indeterminate.  2. Right ventricular systolic function is normal. The right ventricular size is normal. Tricuspid regurgitation signal is inadequate for assessing PA pressure.  3. The mitral valve was not well visualized. No evidence of mitral valve regurgitation. No evidence of mitral stenosis.  4. The aortic valve is tricuspid. Aortic valve regurgitation is not visualized. No aortic stenosis is present.  5. The inferior vena cava is dilated in size with >50% respiratory variability, suggesting right atrial pressure of 8 mmHg. FINDINGS  Left Ventricle: Left ventricular ejection fraction, by estimation, is 60 to 65%. The left ventricle has normal function. The left ventricle has no regional wall motion abnormalities. Definity  contrast agent was given IV to delineate the left ventricular  endocardial borders. The left ventricular internal cavity size was normal in size. There is mild left ventricular hypertrophy. Left ventricular diastolic parameters are indeterminate. Right Ventricle: The right ventricular size is normal. Right vetricular wall thickness was not well visualized. Right ventricular systolic function is normal. Tricuspid regurgitation signal is inadequate for assessing PA pressure. Left Atrium: Left atrial size was not well visualized. Right Atrium: Right atrial size was not well visualized. Pericardium: There is no evidence of pericardial effusion. Mitral Valve: The mitral valve was  not well visualized. No evidence of  mitral valve regurgitation. No evidence of mitral valve stenosis. Tricuspid Valve: The tricuspid valve is normal in structure. Tricuspid valve regurgitation is not demonstrated. No evidence of tricuspid stenosis. Aortic Valve: The aortic valve is tricuspid. Aortic valve regurgitation is not visualized. No aortic stenosis is present. Aortic valve mean gradient measures 3.5 mmHg. Aortic valve peak gradient measures 7.4 mmHg. Aortic valve area, by VTI measures 2.48 cm. Pulmonic Valve: The pulmonic valve was not well visualized. Pulmonic valve regurgitation is mild. No evidence of pulmonic stenosis. Aorta: The aortic root is normal in size and structure and the ascending aorta was not well visualized. Venous: The inferior vena cava is dilated in size with greater than 50% respiratory variability, suggesting right atrial pressure of 8 mmHg. IAS/Shunts: The interatrial septum was not well visualized.  LEFT VENTRICLE PLAX 2D LVIDd:         5.90 cm   Diastology LVIDs:         3.60 cm   LV e' medial:    6.48 cm/s LV PW:         1.20 cm   LV E/e' medial:  18.5 LV IVS:        1.30 cm   LV e' lateral:   9.80 cm/s LVOT diam:     2.10 cm   LV E/e' lateral: 12.3 LV SV:         80 LV SV Index:   35 LVOT Area:     3.46 cm  RIGHT VENTRICLE RV S prime:     12.60 cm/s TAPSE (M-mode): 2.4 cm LEFT ATRIUM              Index        RIGHT ATRIUM           Index LA diam:        4.70 cm  2.04 cm/m   RA Area:     27.50 cm LA Vol (A2C):   104.0 ml 45.17 ml/m  RA Volume:   93.40 ml  40.56 ml/m LA Vol (A4C):   140.0 ml 60.80 ml/m LA Biplane Vol: 121.0 ml 52.55 ml/m  AORTIC VALVE AV Area (Vmax):    2.72 cm AV Area (Vmean):   2.72 cm AV Area (VTI):     2.48 cm AV Vmax:           136.39 cm/s AV Vmean:          85.958 cm/s AV VTI:            0.324 m AV Peak Grad:      7.4 mmHg AV Mean Grad:      3.5 mmHg LVOT Vmax:         107.00 cm/s LVOT Vmean:        67.400 cm/s LVOT VTI:          0.232 m LVOT/AV VTI ratio: 0.72  AORTA Ao Root diam:  3.30 cm MITRAL VALVE MV Area (PHT): 4.94 cm     SHUNTS MV Decel Time: 154 msec     Systemic VTI:  0.23 m MV E velocity: 120.00 cm/s  Systemic Diam: 2.10 cm MV A velocity: 84.27 cm/s MV E/A ratio:  1.42 Armida Lander MD Electronically signed by Armida Lander MD Signature Date/Time: 03/29/2024/7:05:45 PM    Final    CT Angio Chest PE W/Cm &/Or Wo Cm Result Date: 03/28/2024 CLINICAL DATA:  Pulmonary embolus suspected with high probability. Dry cough since yesterday. Shortness  of breath today. EXAM: CT ANGIOGRAPHY CHEST WITH CONTRAST TECHNIQUE: Multidetector CT imaging of the chest was performed using the standard protocol during bolus administration of intravenous contrast. Multiplanar CT image reconstructions and MIPs were obtained to evaluate the vascular anatomy. RADIATION DOSE REDUCTION: This exam was performed according to the departmental dose-optimization program which includes automated exposure control, adjustment of the mA and/or kV according to patient size and/or use of iterative reconstruction technique. CONTRAST:  75mL OMNIPAQUE  IOHEXOL  350 MG/ML SOLN COMPARISON:  Chest radiograph 03/28/2024.  CT chest 03/31/2021 FINDINGS: Cardiovascular: Technically adequate study with good opacification of the central and segmental pulmonary arteries. Mild motion artifact. No focal filling defects are demonstrated. No evidence of significant pulmonary embolus. Cardiac enlargement. No pericardial effusions. Normal caliber thoracic aorta. No dissection. Mild aortic calcification. Coronary artery calcification. Cardiac pacemaker. Mediastinum/Nodes: Thyroid gland is unremarkable. Scattered lymph nodes are not pathologically enlarged, likely reactive. Esophagus is decompressed. Lungs/Pleura: Small bilateral pleural effusions. Atelectasis or consolidation in both lung bases, greater on the right. This could be due to pneumonia in the appropriate clinical setting. Severe diffuse emphysematous changes in the lungs. No  pneumothorax. Upper Abdomen: Cholelithiasis.  No acute abnormalities. Musculoskeletal: Prominent degenerative changes in the thoracic spine with bridging osteophytes, possibly ankylosis. No acute bony abnormalities seen. Review of the MIP images confirms the above findings. IMPRESSION: 1. No evidence of significant pulmonary embolus. 2. Small bilateral pleural effusions. 3. Atelectasis or consolidation in both lung bases, possibly pneumonia in the appropriate clinical setting. 4. Aortic atherosclerosis. 5. Cholelithiasis without evidence of acute cholecystitis. Electronically Signed   By: Boyce Byes M.D.   On: 03/28/2024 19:48   DG Chest Port 1 View Result Date: 03/28/2024 CLINICAL DATA:  Shortness of breath. History of heart failure, coronary artery disease, COPD, pacemaker EXAM: PORTABLE CHEST 1 VIEW COMPARISON:  07/30/2023 FINDINGS: Cardiac pacemaker. Shallow inspiration. Cardiac enlargement with mild vascular congestion. Infiltration or atelectasis in both lung bases. Similar appearance to prior study. No pleural effusion or pneumothorax. Mediastinal contours appear intact. Calcification of the aorta. Degenerative changes in the spine. IMPRESSION: Cardiac enlargement with mild vascular congestion. Infiltration or atelectasis in both lung bases. Electronically Signed   By: Boyce Byes M.D.   On: 03/28/2024 17:44    Demaris Fillers, DO  [facility]  If 7PM-7AM, please contact night-coverage [email or web address] Password [email or web address] 03/31/2024, 3:30 PM   LOS: 3 days

## 2024-04-01 DIAGNOSIS — I5033 Acute on chronic diastolic (congestive) heart failure: Secondary | ICD-10-CM | POA: Diagnosis not present

## 2024-04-01 DIAGNOSIS — J9601 Acute respiratory failure with hypoxia: Secondary | ICD-10-CM | POA: Diagnosis not present

## 2024-04-01 DIAGNOSIS — J441 Chronic obstructive pulmonary disease with (acute) exacerbation: Secondary | ICD-10-CM | POA: Diagnosis not present

## 2024-04-01 LAB — BASIC METABOLIC PANEL WITH GFR
Anion gap: 9 (ref 5–15)
BUN: 25 mg/dL — ABNORMAL HIGH (ref 8–23)
CO2: 34 mmol/L — ABNORMAL HIGH (ref 22–32)
Calcium: 8.7 mg/dL — ABNORMAL LOW (ref 8.9–10.3)
Chloride: 91 mmol/L — ABNORMAL LOW (ref 98–111)
Creatinine, Ser: 0.85 mg/dL (ref 0.61–1.24)
GFR, Estimated: >60 mL/min (ref >60)
Glucose, Bld: 129 mg/dL — ABNORMAL HIGH (ref 70–99)
Potassium: 4.1 mmol/L (ref 3.5–5.1)
Sodium: 134 mmol/L — ABNORMAL LOW (ref 135–145)

## 2024-04-01 LAB — MAGNESIUM: Magnesium: 2.3 mg/dL (ref 1.7–2.4)

## 2024-04-01 LAB — GLUCOSE, CAPILLARY: Glucose-Capillary: 131 mg/dL — ABNORMAL HIGH (ref 70–99)

## 2024-04-01 MED ORDER — TORSEMIDE 20 MG PO TABS: 40.0000 mg | ORAL_TABLET | Freq: Two times a day (BID) | ORAL | 1 refills | Status: AC

## 2024-04-01 MED ORDER — AZITHROMYCIN 250 MG PO TABS: 500.0000 mg | ORAL_TABLET | Freq: Every day | ORAL | 0 refills | Status: DC

## 2024-04-01 MED ORDER — PREDNISONE 10 MG PO TABS: 60.0000 mg | ORAL_TABLET | Freq: Every day | ORAL | 0 refills | Status: AC

## 2024-04-01 MED ORDER — TORSEMIDE 20 MG PO TABS
40.0000 mg | ORAL_TABLET | Freq: Two times a day (BID) | ORAL | Status: DC
  Administered 2024-04-01: 40 mg via ORAL
  Filled 2024-04-01: qty 2

## 2024-04-01 MED ORDER — CEFDINIR 300 MG PO CAPS: 300.0000 mg | ORAL_CAPSULE | Freq: Two times a day (BID) | ORAL | 0 refills | Status: DC

## 2024-04-01 MED ORDER — AZITHROMYCIN 500 MG PO TABS: 500.0000 mg | ORAL_TABLET | Freq: Every day | ORAL | 0 refills | Status: DC

## 2024-04-01 MED ORDER — POTASSIUM CHLORIDE CRYS ER 20 MEQ PO TBCR: 20.0000 meq | EXTENDED_RELEASE_TABLET | Freq: Every day | ORAL | 1 refills | Status: AC

## 2024-04-02 ENCOUNTER — Ambulatory Visit (INDEPENDENT_AMBULATORY_CARE_PROVIDER_SITE_OTHER): Payer: PPO

## 2024-04-02 DIAGNOSIS — I442 Atrioventricular block, complete: Secondary | ICD-10-CM | POA: Diagnosis not present

## 2024-04-02 LAB — CULTURE, BLOOD (ROUTINE X 2)
Culture: NO GROWTH
Culture: NO GROWTH
Special Requests: ADEQUATE
Special Requests: ADEQUATE

## 2024-04-03 LAB — CUP PACEART REMOTE DEVICE CHECK
Battery Remaining Longevity: 27 mo
Battery Voltage: 2.92 V
Brady Statistic RV Percent Paced: 99.84 %
Date Time Interrogation Session: 20250605133940
Implantable Lead Connection Status: 753985
Implantable Lead Implant Date: 20160325
Implantable Lead Location: 753860
Implantable Lead Model: 5076
Implantable Pulse Generator Implant Date: 20160325
Lead Channel Impedance Value: 475 Ohm
Lead Channel Impedance Value: 494 Ohm
Lead Channel Pacing Threshold Amplitude: 0.625 V
Lead Channel Pacing Threshold Pulse Width: 0.4 ms
Lead Channel Sensing Intrinsic Amplitude: 10.5 mV
Lead Channel Sensing Intrinsic Amplitude: 10.5 mV
Lead Channel Setting Pacing Amplitude: 2.5 V
Lead Channel Setting Pacing Pulse Width: 0.4 ms
Lead Channel Setting Sensing Sensitivity: 0.9 mV
Zone Setting Status: 755011

## 2024-04-04 ENCOUNTER — Ambulatory Visit: Payer: Self-pay | Admitting: Cardiovascular Disease

## 2024-04-07 ENCOUNTER — Telehealth (HOSPITAL_COMMUNITY): Payer: Self-pay

## 2024-04-07 NOTE — Telephone Encounter (Signed)
 Called to confirm/remind patient of their appointment at [facility] on 07/09/2024 10:15.   Appointment:   [] Confirmed  [x] Left mess   [] No answer/No voice mail  [] VM Full/unable to leave message  [] Phone not in service  Patient reminded to bring all medications and/or complete list.  Confirmed patient has transportation. Gave directions, instructed to utilize valet parking.

## 2024-04-07 NOTE — Progress Notes (Incomplete)
 HEART & VASCULAR TRANSITION OF CARE CONSULT NOTE     Referring Physician: [facility]   Chief Complaint:   HPI: Referred to clinic by *** for heart failure consultation.   Nathan Miller is a 73 y.o. male   Cardiac Testing    Past Medical History:  Diagnosis Date   (HFpEF) heart failure with preserved ejection fraction (HCC)    CAD (coronary artery disease)    NSTEMI in 2001. Cardiac cath showed: LAD: 40% proximal, LCX: 70% mid, RCA thrombotic occlusion in mid segment. s/p PCI and 2 overlapped BMSs to RCA.    Complete heart block (HCC)    COPD (chronic obstructive pulmonary disease) (HCC)    Diabetes mellitus    Hypertension    Osteomyelitis (HCC)    Presence of permanent cardiac pacemaker     Current Outpatient Medications  Medication Sig Dispense Refill   albuterol  (VENTOLIN  HFA) 108 (90 Base) MCG/ACT inhaler Inhale 2 puffs into the lungs every 6 (six) hours as needed for wheezing or shortness of breath.     ALPRAZolam (XANAX) 0.5 MG tablet Take 0.5 mg by mouth daily as needed for anxiety.     aspirin  EC 81 MG tablet Take 1 tablet (81 mg total) by mouth daily. 30 tablet 0   atorvastatin  (LIPITOR ) 80 MG tablet Take 1 tablet (80 mg total) by mouth daily. 90 tablet 3   azithromycin  (ZITHROMAX ) 500 MG tablet Take 1 tablet (500 mg total) by mouth daily. 3 tablet 0   cefdinir  (OMNICEF ) 300 MG capsule Take 1 capsule (300 mg total) by mouth 2 (two) times daily. 6 capsule 0   ferrous sulfate  325 (65 FE) MG tablet Take 325 mg by mouth every other day.     metFORMIN  (GLUCOPHAGE ) 500 MG tablet Take 1 tablet (500 mg total) by mouth 2 (two) times daily with a meal. 60 tablet 0   Multiple Vitamins-Minerals (CENTRUM ADULTS) TABS Take 1 tablet by mouth daily.     nitroGLYCERIN  (NITROSTAT ) 0.4 MG SL tablet Place 1 tablet (0.4 mg total) under the tongue every 5 (five) minutes as needed for chest pain. 25 tablet 3   potassium chloride  SA (KLOR-CON  M) 20  MEQ tablet Take 1 tablet (20 mEq total) by mouth daily. 30 tablet 1   predniSONE  (DELTASONE ) 10 MG tablet Take 6 tablets (60 mg total) by mouth daily with breakfast. And decrease by one tablet daily 21 tablet 0   SPIRIVA  RESPIMAT 2.5 MCG/ACT AERS Inhale 2 puffs into the lungs daily.     torsemide  (DEMADEX ) 20 MG tablet Take 2 tablets (40 mg total) by mouth 2 (two) times daily. 120 tablet 1   No current facility-administered medications for this visit.    No Known Allergies    Social History   Socioeconomic History   Marital status: Married    Spouse name: Nellie Banas   Number of children: Not on file   Years of education: Not on file   Highest education level: 12th grade  Occupational History   Not on file  Tobacco Use   Smoking status: Former    Current packs/day: 0.00    Average packs/day: 0.3 packs/day for 40.0 years (12.0 ttl pk-yrs)    Types: Cigarettes    Start date: 10/16/1968    Quit date: 10/17/2007    Years since quitting: 16.4   Smokeless tobacco: Current    Types: Snuff  Vaping Use   Vaping status: Never Used  Substance and  Sexual Activity   Alcohol use: Not Currently    Alcohol/week: 42.0 standard drinks of alcohol    Types: 42 Cans of beer per week    Comment: beer   Drug use: No   Sexual activity: Not on file  Other Topics Concern   Not on file  Social History Narrative   Not on file   Social Drivers of Health   Financial Resource Strain: Low Risk  (03/31/2024)   Overall Financial Resource Strain (CARDIA)    Difficulty of Paying Living Expenses: Not hard at all  Food Insecurity: Low Risk  (04/02/2024)   Received from [facility]   Hunger Vital Sign    Worried About Running Out of Food in the Last Year: Never true    Ran Out of Food in the Last Year: Never true  Transportation Needs: No Transportation Needs (04/02/2024)   Received from [facility]    In the past 12 months, has lack of reliable transportation kept you from medical  appointments, meetings, work or from getting things needed for daily living? : No  Physical Activity: Inactive (07/13/2021)   Received from [facility]   Exercise Vital Sign  Stress: No Stress Concern Present (07/13/2021)   Received from [facility] of Occupational Health - Occupational Stress Questionnaire    Feeling of Stress : Not at all  Social Connections: Socially Integrated (03/29/2024)   Social Connection and Isolation Panel [NHANES]    Frequency of Communication with Friends and Family: Three times a week    Frequency of Social Gatherings with Friends and Family: More than three times a week    Attends Religious Services: More than 4 times per year    Active Member of [employer] or Organizations: Yes    Attends Engineer, structural: More than 4 times per year    Marital Status: Married  Catering manager Violence: Not At Risk (03/29/2024)   Humiliation, Afraid, Rape, and Kick questionnaire    Fear of Current or Ex-Partner: No    Emotionally Abused: No    Physically Abused: No    Sexually Abused: No      Family History  Problem Relation Age of Onset   Heart disease Father    COPD Father    Heart disease Paternal Grandfather     There were no vitals filed for this visit.  PHYSICAL EXAM: General:  Well appearing. No respiratory difficulty HEENT: normal Neck: supple. no JVD. Carotids 2+ bilat; no bruits. No lymphadenopathy or thryomegaly appreciated. Cor: PMI nondisplaced. Regular rate & rhythm. No rubs, gallops or murmurs. Lungs: clear Abdomen: soft, nontender, nondistended. No hepatosplenomegaly. No bruits or masses. Good bowel sounds. Extremities: no cyanosis, clubbing, rash, edema Neuro: alert & oriented x 3, cranial nerves grossly intact. moves all 4 extremities w/o difficulty. Affect pleasant.  ECG:   ASSESSMENT & PLAN:  NYHA *** GDMT  Diuretic- BB- Ace/ARB/ARNI MRA SGLT2i    Referred to HFSW (PCP,  Medications, Transportation, ETOH Abuse, Drug Abuse, Insurance, Financial ): Yes or No Refer to Pharmacy: Yes or No Refer to Home Health: Yes on No Refer to [facility]: Yes or no  Refer to General Cardiology: Yes or No  Follow up

## 2024-04-08 ENCOUNTER — Ambulatory Visit (HOSPITAL_COMMUNITY)
Admission: RE | Admit: 2024-04-08 | Discharge: 2024-04-08 | Disposition: A | Discharge status: Home / Self Care | Source: Ambulatory Visit | Attending: Physician Assistant | Admitting: Physician Assistant

## 2024-04-08 ENCOUNTER — Ambulatory Visit (HOSPITAL_COMMUNITY): Payer: Self-pay | Admitting: Physician Assistant

## 2024-04-08 VITALS — BP 138/84 | HR 64 | Wt 255.4 lb

## 2024-04-08 DIAGNOSIS — Z79899 Other long term (current) drug therapy: Secondary | ICD-10-CM | POA: Insufficient documentation

## 2024-04-08 DIAGNOSIS — Z950 Presence of cardiac pacemaker: Secondary | ICD-10-CM | POA: Insufficient documentation

## 2024-04-08 DIAGNOSIS — Z7982 Long term (current) use of aspirin: Secondary | ICD-10-CM | POA: Diagnosis not present

## 2024-04-08 DIAGNOSIS — I5032 Chronic diastolic (congestive) heart failure: Secondary | ICD-10-CM | POA: Insufficient documentation

## 2024-04-08 DIAGNOSIS — I10 Essential (primary) hypertension: Secondary | ICD-10-CM | POA: Diagnosis not present

## 2024-04-08 DIAGNOSIS — Z7984 Long term (current) use of oral hypoglycemic drugs: Secondary | ICD-10-CM | POA: Diagnosis not present

## 2024-04-08 DIAGNOSIS — J9610 Chronic respiratory failure, unspecified whether with hypoxia or hypercapnia: Secondary | ICD-10-CM | POA: Diagnosis not present

## 2024-04-08 DIAGNOSIS — I442 Atrioventricular block, complete: Secondary | ICD-10-CM | POA: Diagnosis not present

## 2024-04-08 DIAGNOSIS — J449 Chronic obstructive pulmonary disease, unspecified: Secondary | ICD-10-CM | POA: Insufficient documentation

## 2024-04-08 DIAGNOSIS — I110 Hypertensive heart disease with heart failure: Secondary | ICD-10-CM | POA: Insufficient documentation

## 2024-04-08 DIAGNOSIS — E119 Type 2 diabetes mellitus without complications: Secondary | ICD-10-CM | POA: Insufficient documentation

## 2024-04-08 DIAGNOSIS — Z9981 Dependence on supplemental oxygen: Secondary | ICD-10-CM | POA: Insufficient documentation

## 2024-04-08 DIAGNOSIS — I252 Old myocardial infarction: Secondary | ICD-10-CM | POA: Diagnosis not present

## 2024-04-08 DIAGNOSIS — I2510 Atherosclerotic heart disease of native coronary artery without angina pectoris: Secondary | ICD-10-CM | POA: Insufficient documentation

## 2024-04-08 DIAGNOSIS — E669 Obesity, unspecified: Secondary | ICD-10-CM | POA: Insufficient documentation

## 2024-04-08 LAB — BASIC METABOLIC PANEL WITH GFR
Anion gap: 13 (ref 5–15)
BUN: 22 mg/dL (ref 8–23)
CO2: 37 mmol/L — ABNORMAL HIGH (ref 22–32)
Calcium: 9.2 mg/dL (ref 8.9–10.3)
Chloride: 87 mmol/L — ABNORMAL LOW (ref 98–111)
Creatinine, Ser: 1.1 mg/dL (ref 0.61–1.24)
GFR, Estimated: >60 mL/min (ref >60)
Glucose, Bld: 123 mg/dL — ABNORMAL HIGH (ref 70–99)
Potassium: 4 mmol/L (ref 3.5–5.1)
Sodium: 137 mmol/L (ref 135–145)

## 2024-04-08 LAB — BRAIN NATRIURETIC PEPTIDE: B Natriuretic Peptide: 172.4 pg/mL — ABNORMAL HIGH (ref 0.0–100.0)

## 2024-04-08 NOTE — Patient Instructions (Addendum)
 There has been no changes to your medications.  Labs done today, your results will be available in MyChart, we will contact you for abnormal readings.  Please wear your compression stocking.  Thank you for allowing us  to provider your heart failure care after your recent hospitalization. Please follow-up with Dr. Lavonne Prairie in 4 weeks.  If you have any questions, issues, or concerns before your next appointment please call our office at [phone], opt. 2 and leave a message for the triage nurse.   At the [facility], you and your health needs are our priority. As part of our continuing mission to provide you with exceptional heart care, we have created designated Provider Care Teams. These Care Teams include your primary Cardiologist (physician) and Advanced Practice Providers (APPs- Physician Assistants and Nurse Practitioners) who all work together to provide you with the care you need, when you need it.   You may see any of the following providers on your designated Care Team at your next follow up: Dr Jules Oar Dr Peder Bourdon Dr. Alwin Baars Dr. Arta Lark Amy Marijane Shoulders, NP Ruddy Corral, [place] [facility] [place], [place] Dennise Fitz, NP [place] Lee, NP Shawnee Dellen, NP Luster Salters, PharmD Bevely Brush, PharmD   Please be sure to bring in all your medications bottles to every appointment.    Thank you for choosing [facility]

## 2024-05-26 NOTE — Addendum Note (Signed)
 Addended by: VICCI SELLER A on: 05/26/2024 11:24 AM   Modules accepted: Orders

## 2024-05-26 NOTE — Progress Notes (Signed)
 Remote pacemaker transmission.

## 2024-07-02 ENCOUNTER — Ambulatory Visit (INDEPENDENT_AMBULATORY_CARE_PROVIDER_SITE_OTHER): Payer: PPO

## 2024-07-02 DIAGNOSIS — I5032 Chronic diastolic (congestive) heart failure: Secondary | ICD-10-CM | POA: Diagnosis not present

## 2024-07-04 ENCOUNTER — Ambulatory Visit: Payer: Self-pay | Admitting: Cardiovascular Disease

## 2024-07-04 LAB — CUP PACEART REMOTE DEVICE CHECK
Battery Remaining Longevity: 24 mo
Battery Voltage: 2.92 V
Brady Statistic RV Percent Paced: 99.75 %
Date Time Interrogation Session: 20250905103303
Implantable Lead Connection Status: 753985
Implantable Lead Implant Date: 20160325
Implantable Lead Location: 753860
Implantable Lead Model: 5076
Implantable Pulse Generator Implant Date: 20160325
Lead Channel Impedance Value: 475 Ohm
Lead Channel Impedance Value: 494 Ohm
Lead Channel Pacing Threshold Amplitude: 0.625 V
Lead Channel Pacing Threshold Pulse Width: 0.4 ms
Lead Channel Sensing Intrinsic Amplitude: 16 mV
Lead Channel Sensing Intrinsic Amplitude: 16 mV
Lead Channel Setting Pacing Amplitude: 2.5 V
Lead Channel Setting Pacing Pulse Width: 0.4 ms
Lead Channel Setting Sensing Sensitivity: 0.9 mV
Zone Setting Status: 755011

## 2024-07-12 NOTE — Progress Notes (Signed)
 Remote PPM Transmission

## 2024-07-23 ENCOUNTER — Other Ambulatory Visit (HOSPITAL_BASED_OUTPATIENT_CLINIC_OR_DEPARTMENT_OTHER): Payer: Self-pay

## 2024-08-01 ENCOUNTER — Ambulatory Visit: Attending: Cardiovascular Disease | Admitting: Cardiovascular Disease

## 2024-08-01 ENCOUNTER — Encounter: Payer: Self-pay | Admitting: Cardiovascular Disease

## 2024-08-01 VITALS — BP 108/60 | HR 80 | Ht 69.0 in | Wt 259.4 lb

## 2024-08-01 DIAGNOSIS — I5032 Chronic diastolic (congestive) heart failure: Secondary | ICD-10-CM

## 2024-08-01 DIAGNOSIS — I442 Atrioventricular block, complete: Secondary | ICD-10-CM | POA: Diagnosis not present

## 2024-08-01 DIAGNOSIS — R001 Bradycardia, unspecified: Secondary | ICD-10-CM | POA: Diagnosis not present

## 2024-08-01 NOTE — Progress Notes (Signed)
    PCP: [facility]   Primary EP:  Dr Nancey Deward Nathan Miller is a 73 y.o. male who presents today for routine electrophysiology followup.  Since last being seen in our clinic, the patient reports doing very well.    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    Today, he denies symptoms of palpitations, chest pain, shortness of breath,  lower extremity edema, dizziness, presyncope, or syncope. he has no device related complaints -- no new tenderness, drainage, redness. The patient is otherwise without complaint today.      Physical Exam: Vitals:   08/01/24 1334  BP: 108/60  Pulse: 80  SpO2: 92%  Weight: 259 lb 6.4 oz (117.7 kg)  Height: 5' 9 (1.753 m)     Gen: Appears comfortable, well-nourished CV: RRR, no dependent edema - s/p R BKA The device site is normal -- no tenderness, edema, drainage, redness, threatened erosion.  Pulm: breathing easily  Pacemaker interrogation- reviewed in detail today,  See PACEART report  ekg tracing ordered today is personally reviewed and shows sinus with AV dissociation, V paced  Assessment and Plan:  1. Symptomatic complete heart block Normal pacemaker function See Pace Art report No changes today he is device dependant today At time of implant, only a ventricular lead could be placed due to venous anatomy issues. He has done great with VVI pacing and currently we do not plan to upgrade his device.  2. HTN BP elevated today No change required today  3. CAD No ischemic symptoms  4. Obesity Body mass index is 38.31 kg/m. Lifestyle modification advised He is making progress  Return in a year  Eulas FORBES Nancey, MD  08/01/2024 1:52 PM

## 2024-08-01 NOTE — Patient Instructions (Signed)

## 2024-08-02 DIAGNOSIS — J181 Lobar pneumonia, unspecified organism: Secondary | ICD-10-CM

## 2024-08-06 LAB — CUP PACEART INCLINIC DEVICE CHECK
Date Time Interrogation Session: 20251003191708
Implantable Lead Connection Status: 753985
Implantable Lead Implant Date: 20160325
Implantable Lead Location: 753860
Implantable Lead Model: 5076
Implantable Pulse Generator Implant Date: 20160325

## 2024-08-11 ENCOUNTER — Ambulatory Visit: Payer: Self-pay | Admitting: Cardiovascular Disease

## 2024-10-01 ENCOUNTER — Ambulatory Visit: Payer: PPO

## 2024-10-01 DIAGNOSIS — I5032 Chronic diastolic (congestive) heart failure: Secondary | ICD-10-CM

## 2024-10-02 ENCOUNTER — Ambulatory Visit: Payer: Self-pay | Admitting: Cardiovascular Disease

## 2024-10-02 LAB — CUP PACEART REMOTE DEVICE CHECK
Battery Remaining Longevity: 21 mo
Battery Voltage: 2.91 V
Brady Statistic RV Percent Paced: 99.41 %
Date Time Interrogation Session: 20251204120545
Implantable Lead Connection Status: 753985
Implantable Lead Implant Date: 20160325
Implantable Lead Location: 753860
Implantable Lead Model: 5076
Implantable Pulse Generator Implant Date: 20160325
Lead Channel Impedance Value: 475 Ohm
Lead Channel Impedance Value: 494 Ohm
Lead Channel Pacing Threshold Amplitude: 0.625 V
Lead Channel Pacing Threshold Pulse Width: 0.4 ms
Lead Channel Sensing Intrinsic Amplitude: 6.125 mV
Lead Channel Sensing Intrinsic Amplitude: 6.125 mV
Lead Channel Setting Pacing Amplitude: 2.5 V
Lead Channel Setting Pacing Pulse Width: 0.4 ms
Lead Channel Setting Sensing Sensitivity: 0.9 mV
Zone Setting Status: 755011

## 2024-10-07 NOTE — Progress Notes (Signed)
 Remote PPM Transmission

## 2024-11-22 ENCOUNTER — Emergency Department (HOSPITAL_COMMUNITY)
Admission: EM | Admit: 2024-11-22 | Discharge: 2024-11-22 | Disposition: A | Discharge status: Home / Self Care | Attending: Emergency Medicine | Admitting: Emergency Medicine

## 2024-11-22 ENCOUNTER — Emergency Department (HOSPITAL_COMMUNITY)

## 2024-11-22 ENCOUNTER — Other Ambulatory Visit: Payer: Self-pay

## 2024-11-22 DIAGNOSIS — R339 Retention of urine, unspecified: Secondary | ICD-10-CM | POA: Diagnosis present

## 2024-11-22 DIAGNOSIS — Z7982 Long term (current) use of aspirin: Secondary | ICD-10-CM | POA: Diagnosis not present

## 2024-11-22 LAB — CBC WITH DIFFERENTIAL/PLATELET
Abs Immature Granulocytes: 0.04 10*3/uL (ref 0.00–0.07)
Basophils Absolute: 0.1 10*3/uL (ref 0.0–0.1)
Basophils Relative: 1 %
Eosinophils Absolute: 0.2 10*3/uL (ref 0.0–0.5)
Eosinophils Relative: 2 %
HCT: 35.4 % — ABNORMAL LOW (ref 39.0–52.0)
Hemoglobin: 11.2 g/dL — ABNORMAL LOW (ref 13.0–17.0)
Immature Granulocytes: 1 %
Lymphocytes Relative: 17 %
Lymphs Abs: 1.4 10*3/uL (ref 0.7–4.0)
MCH: 28.3 pg (ref 26.0–34.0)
MCHC: 31.6 g/dL (ref 30.0–36.0)
MCV: 89.4 fL (ref 80.0–100.0)
Monocytes Absolute: 0.8 10*3/uL (ref 0.1–1.0)
Monocytes Relative: 9 %
Neutro Abs: 5.8 10*3/uL (ref 1.7–7.7)
Neutrophils Relative %: 70 %
Platelets: 245 10*3/uL (ref 150–400)
RBC: 3.96 MIL/uL — ABNORMAL LOW (ref 4.22–5.81)
RDW: 13.2 % (ref 11.5–15.5)
WBC: 8.3 10*3/uL (ref 4.0–10.5)
nRBC: 0 % (ref 0.0–0.2)

## 2024-11-22 LAB — URINALYSIS, ROUTINE W REFLEX MICROSCOPIC
Bilirubin Urine: NEGATIVE
Glucose, UA: NEGATIVE mg/dL
Hgb urine dipstick: NEGATIVE
Ketones, ur: NEGATIVE mg/dL
Leukocytes,Ua: NEGATIVE
Nitrite: NEGATIVE
Protein, ur: NEGATIVE mg/dL
Specific Gravity, Urine: 1.004 — ABNORMAL LOW (ref 1.005–1.030)
pH: 8 (ref 5.0–8.0)

## 2024-11-22 LAB — COMPREHENSIVE METABOLIC PANEL WITH GFR
ALT: 9 U/L (ref 0–44)
AST: 17 U/L (ref 15–41)
Albumin: 3.9 g/dL (ref 3.5–5.0)
Alkaline Phosphatase: 167 U/L — ABNORMAL HIGH (ref 38–126)
Anion gap: 13 (ref 5–15)
BUN: 8 mg/dL (ref 8–23)
CO2: 33 mmol/L — ABNORMAL HIGH (ref 22–32)
Calcium: 9.4 mg/dL (ref 8.9–10.3)
Chloride: 86 mmol/L — ABNORMAL LOW (ref 98–111)
Creatinine, Ser: 0.69 mg/dL (ref 0.61–1.24)
GFR, Estimated: >60 mL/min
Glucose, Bld: 117 mg/dL — ABNORMAL HIGH (ref 70–99)
Potassium: 3.7 mmol/L (ref 3.5–5.1)
Sodium: 133 mmol/L — ABNORMAL LOW (ref 135–145)
Total Bilirubin: 0.9 mg/dL (ref 0.0–1.2)
Total Protein: 7 g/dL (ref 6.5–8.1)

## 2024-11-22 NOTE — Discharge Instructions (Signed)
 Please call to make an appointment with urology and follow-up closely with them.  Return to the emergency department immediately for any new or worsening symptoms.

## 2024-11-22 NOTE — ED Triage Notes (Signed)
 Pt arrived via [facility]. Pt called EMS because he has had urinary retention since this am. Pt feels like he still has some to get out but can't get it out. Wife claims he hasn't had a bowel movement since Thursday. Pt uses 2 lpm o2 via [place]. VS normal for EMS. Pt just has discomfort with bladder and no other complaints.

## 2024-11-22 NOTE — ED Provider Notes (Signed)
 " [place] EMERGENCY DEPARTMENT AT [facility] Provider Note   CSN: [id number] Arrival date & time: 11/22/24  1206     Patient presents with: urinary pressure   Nathan Miller is a 74 y.o. male.   Patient is a 74 year old male who presents to the emergency department the chief complaint of inability urinate since early this morning.  Patient notes that he has had poor urinary output for approximate the past 24 hours.  Patient does wear 2 L nasal cannula at home and oxygen  saturations have been stable.  He denies any associated chest pain or shortness of breath.  He admits to a fullness sensation to his suprapubic region.  Spouse also notes that he has been constipated with last bowel movement being 2 days ago.  He has had no associated dysuria or hematuria.  There has not been any associated urinary bowel incontinence, saddle paresthesias.  He denies any pain to his back.        Prior to Admission medications  Medication Sig Start Date End Date Taking? Authorizing Provider  albuterol  (VENTOLIN  HFA) 108 (90 Base) MCG/ACT inhaler Inhale 2 puffs into the lungs every 6 (six) hours as needed for wheezing or shortness of breath. 07/12/21   [provider]  ALPRAZolam (XANAX) 0.5 MG tablet Take 0.5 mg by mouth daily as needed for anxiety. 03/06/24   [provider]  aspirin  EC 81 MG tablet Take 1 tablet (81 mg total) by mouth daily. 01/25/15   Armanda Standing, MD  atorvastatin  (LIPITOR ) 80 MG tablet Take 1 tablet (80 mg total) by mouth daily. 05/04/17   Allred, Lynwood, MD  ferrous sulfate  325 (65 FE) MG tablet Take 325 mg by mouth every other day.    [provider]  metFORMIN  (GLUCOPHAGE ) 500 MG tablet Take 1 tablet (500 mg total) by mouth 2 (two) times daily with a meal. 01/25/15   Short, Standing, MD  Multiple Vitamins-Minerals (CENTRUM ADULTS) TABS Take 1 tablet by mouth daily.    [provider]  nitroGLYCERIN  (NITROSTAT ) 0.4 MG SL tablet Place 1  tablet (0.4 mg total) under the tongue every 5 (five) minutes as needed for chest pain. 05/04/23   Lavona Lynwood, MD  potassium chloride  SA (KLOR-CON  M) 20 MEQ tablet Take 1 tablet (20 mEq total) by mouth daily. 04/01/24   Evonnie Lenis, MD  predniSONE  (DELTASONE ) 10 MG tablet Take 6 tablets (60 mg total) by mouth daily with breakfast. And decrease by one tablet daily Patient not taking: Reported on 08/01/2024 04/01/24   Evonnie Lenis, MD  SPIRIVA  RESPIMAT 2.5 MCG/ACT AERS Inhale 2 puffs into the lungs daily. 07/27/21   [provider]  torsemide  (DEMADEX ) 20 MG tablet Take 2 tablets (40 mg total) by mouth 2 (two) times daily. 04/01/24   Evonnie Lenis, MD    Allergies: Patient has no known allergies.    Review of Systems  Genitourinary:        Urinary retention  All other systems reviewed and are negative.   Updated Vital Signs BP (!) 166/89   Pulse 83   Temp 98.2 F (36.8 C) (Oral)   Resp 16   Ht 5' 7 (1.702 m)   Wt 108.9 kg   SpO2 95%   BMI 37.59 kg/m   Physical Exam Vitals and nursing note reviewed.  Constitutional:      General: He is not in acute distress.    Appearance: Normal appearance. He is not ill-appearing.  HENT:  Head: Normocephalic and atraumatic.     Nose: Nose normal.     Mouth/Throat:     Mouth: Mucous membranes are moist.  Eyes:     Extraocular Movements: Extraocular movements intact.     Conjunctiva/sclera: Conjunctivae normal.     Pupils: Pupils are equal, round, and reactive to light.  Cardiovascular:     Rate and Rhythm: Normal rate and regular rhythm.     Pulses: Normal pulses.     Heart sounds: Normal heart sounds. No murmur heard.    No gallop.  Pulmonary:     Effort: Pulmonary effort is normal. No respiratory distress.     Breath sounds: Normal breath sounds. No stridor. No wheezing, rhonchi or rales.  Abdominal:     General: Abdomen is flat. Bowel sounds are normal. There is no distension.     Palpations: Abdomen is soft.     Tenderness:  There is no abdominal tenderness. There is no guarding.  Musculoskeletal:        General: Normal range of motion.     Cervical back: Normal range of motion and neck supple. No rigidity or tenderness.  Skin:    General: Skin is warm and dry.     Findings: No bruising or rash.  Neurological:     General: No focal deficit present.     Mental Status: He is alert and oriented to person, place, and time. Mental status is at baseline.     Cranial Nerves: No cranial nerve deficit.     Sensory: No sensory deficit.     Motor: No weakness.     Coordination: Coordination normal.  Psychiatric:        Mood and Affect: Mood normal.        Behavior: Behavior normal.        Thought Content: Thought content normal.        Judgment: Judgment normal.     (all labs ordered are listed, but only abnormal results are displayed) Labs Reviewed  URINALYSIS, ROUTINE W REFLEX MICROSCOPIC - Abnormal; Notable for the following components:      Result Value   Color, Urine STRAW (*)    Specific Gravity, Urine 1.004 (*)    All other components within normal limits  COMPREHENSIVE METABOLIC PANEL WITH GFR  CBC WITH DIFFERENTIAL/PLATELET    EKG: None  Radiology: No results found.   Procedures   Medications Ordered in the ED - No data to display                                  Medical Decision Making Amount and/or Complexity of Data Reviewed Labs: ordered. Radiology: ordered.   This patient presents to the ED for concern of urinary retention differential diagnosis includes BPH, prostatitis, urinary tract infection, cauda equina syndrome, vertebral os mellitus, epidural abscess    Additional history obtained:  Additional history obtained from family External records from outside source obtained and reviewed including medical records   Lab Tests:  I Ordered, and personally interpreted labs.  The pertinent results include: No leukocytosis, anemia at baseline, unremarkable urinalysis, mild  hyponatremia, normal kidney function liver function   Imaging Studies ordered:  I ordered imaging studies including KUB I independently visualized and interpreted imaging which showed no overt stool burden I agree with the radiologist interpretation    Problem List / ED Course:  Patient is doing well at this time and is stable for  discharge home.  Discussed with patient that blood work has been unremarkable.  Will leave Foley catheter in place given his urinary retention and recommend close outpatient follow-up with urology.  Patient did have strong rectal tone on exam I do not suspect underlying etiology such as cauda equina syndrome, vertebral osteomyelitis, epidural abscess.  Abdominal exam is benign with no focal tenderness throughout.  KUB demonstrated no indication for overt stool burden.  The need for close follow-up with urology on outpatient basis was discussed as well as strict turn precautions for any new or worsening symptoms.  Patient voiced understanding and had no additional questions.  No indication for admission at this time.   Social Determinants of Health:          Final diagnoses:  None    ED Discharge Orders     None          Daralene Lonni JONETTA DEVONNA 11/22/24 1432    Suzette Pac, MD 11/22/24 2000  "

## 2024-11-25 ENCOUNTER — Telehealth: Payer: Self-pay | Admitting: Urology

## 2024-11-25 NOTE — Telephone Encounter (Signed)
 Nathan Miller

## 2024-11-26 ENCOUNTER — Encounter: Payer: Self-pay | Admitting: Urology

## 2024-11-26 ENCOUNTER — Ambulatory Visit: Admitting: Urology

## 2024-11-26 VITALS — BP 137/73 | HR 56

## 2024-11-26 DIAGNOSIS — R338 Other retention of urine: Secondary | ICD-10-CM

## 2024-11-26 DIAGNOSIS — N401 Enlarged prostate with lower urinary tract symptoms: Secondary | ICD-10-CM | POA: Diagnosis not present

## 2024-11-26 DIAGNOSIS — N138 Other obstructive and reflux uropathy: Secondary | ICD-10-CM

## 2024-11-26 DIAGNOSIS — R339 Retention of urine, unspecified: Secondary | ICD-10-CM

## 2024-11-26 MED ORDER — TAMSULOSIN HCL 0.4 MG PO CAPS: 0.4000 mg | ORAL_CAPSULE | Freq: Every day | ORAL | 11 refills | Status: AC

## 2024-11-26 NOTE — Progress Notes (Unsigned)
 Fill and Pull Catheter Removal  Patient is present today for a catheter removal due to urinary retention.  175 ml of sterile water was instilled into the bladder when the patient felt the urge to urinate. 10 ml of water was then drained from the balloon.  A 16FR foley cath was removed from the bladder no complications were noted .  Foley catheter intact and time of removal. Patient as then given some time to void on their own.  Patient cannot void  on their own after some time.  Patient tolerated well.  One oral prophylactic antibiotic given per MD orders  Performed by: Exie T. CMA  Follow up/ Additional notes: patient refused catheter placement will follow up Friday with a PVR

## 2024-11-26 NOTE — Progress Notes (Unsigned)
 "  11/26/2024 2:46 PM   Nathan Miller [birth year] [id number]  Referring provider: [facility] At [address] [place],  [place] [address]  Difficulty urinating   HPI: Nathan Miller is a 73yo here for evaluation of urinary retention. He was seen in the ER 11/22/24 with any inability to urinate and had a foley placed. For the past 5-6 months he has noted difficulty urinating. He has issues with constipation and he notes his urination is worse after he takes a laxative. [name] He notes urinary frequency and urgency has been worse over the past 6 months. His urine stream is intermittently weak. He is on torsemide  40mg  BID for CHF.    PMH: Past Medical History:  Diagnosis Date   (HFpEF) heart failure with preserved ejection fraction (HCC)    CAD (coronary artery disease)    NSTEMI in 2001. Cardiac cath showed: LAD: 40% proximal, LCX: 70% mid, RCA thrombotic occlusion in mid segment. s/p PCI and 2 overlapped BMSs to RCA.    Complete heart block (HCC)    COPD (chronic obstructive pulmonary disease) (HCC)    Diabetes mellitus    Hypertension    Osteomyelitis (HCC)    Presence of permanent cardiac pacemaker     Surgical History: Past Surgical History:  Procedure Laterality Date   AMPUTATION Right 08/03/2021   Procedure: AMPUTATION BELOW KNEE;  Surgeon: Harden Jerona GAILS, MD;  Location: [facility] OR;  Service: Orthopedics;  Laterality: Right;   BELOW KNEE LEG AMPUTATION     BIOPSY  08/07/2021   Procedure: BIOPSY;  Surgeon: Saintclair Jasper, MD;  Location: [facility] ENDOSCOPY;  Service: Gastroenterology;;   BUBBLE STUDY  04/07/2021   Procedure: BUBBLE STUDY;  Surgeon: Loni Soyla LABOR, MD;  Location: [facility] ENDOSCOPY;  Service: Cardiology;;   CARDIAC CATHETERIZATION     COLONOSCOPY N/A 08/07/2021   Procedure: COLONOSCOPY;  Surgeon: Saintclair Jasper, MD;  Location: [facility] ENDOSCOPY;  Service: Gastroenterology;  Laterality: N/A;   CORONARY ANGIOPLASTY     ESOPHAGOGASTRODUODENOSCOPY (EGD) WITH  PROPOFOL  N/A 08/07/2021   Procedure: ESOPHAGOGASTRODUODENOSCOPY (EGD) WITH PROPOFOL ;  Surgeon: Saintclair Jasper, MD;  Location: [facility] ENDOSCOPY;  Service: Gastroenterology;  Laterality: N/A;   PERMANENT PACEMAKER INSERTION N/A 01/22/2015   MDT Advisa pacemaker implanted for complete heart block by Dr Kelsie   POLYPECTOMY  08/07/2021   Procedure: POLYPECTOMY;  Surgeon: Saintclair Jasper, MD;  Location: [facility] ENDOSCOPY;  Service: Gastroenterology;;   TEE WITHOUT CARDIOVERSION N/A 04/07/2021   Procedure: TRANSESOPHAGEAL ECHOCARDIOGRAM (TEE);  Surgeon: Loni Soyla LABOR, MD;  Location: [facility] ENDOSCOPY;  Service: Cardiology;  Laterality: N/A;   TEMPORARY PACEMAKER INSERTION N/A 01/21/2015   Procedure: TEMPORARY PACEMAKER INSERTION;  Surgeon: Alm LELON Clay, MD;  Location: [facility] CATH LAB;  Service: Cardiovascular;  Laterality: N/A;    Home Medications:  Allergies as of 11/26/2024   No Known Allergies      Medication List        Accurate as of November 26, 2024  2:46 PM. If you have any questions, ask your nurse or doctor.          albuterol  108 (90 Base) MCG/ACT inhaler Commonly known as: VENTOLIN  HFA Inhale 2 puffs into the lungs every 6 (six) hours as needed for wheezing or shortness of breath.   ALPRAZolam 0.5 MG tablet Commonly known as: XANAX Take 0.5 mg by mouth daily as needed for anxiety.   aspirin  EC 81 MG tablet Take 1 tablet (81 mg total) by mouth daily.   atorvastatin  80 MG  tablet Commonly known as: LIPITOR  Take 1 tablet (80 mg total) by mouth daily.   Centrum Adults Tabs Take 1 tablet by mouth daily.   ferrous sulfate  325 (65 FE) MG tablet Take 325 mg by mouth every other day.   metFORMIN  500 MG tablet Commonly known as: GLUCOPHAGE  Take 1 tablet (500 mg total) by mouth 2 (two) times daily with a meal.   nitroGLYCERIN  0.4 MG SL tablet Commonly known as: Nitrostat  Place 1 tablet (0.4 mg total) under the tongue every 5 (five) minutes as needed for chest pain.   potassium  chloride SA 20 MEQ tablet Commonly known as: KLOR-CON  M Take 1 tablet (20 mEq total) by mouth daily.   predniSONE  10 MG tablet Commonly known as: DELTASONE  Take 6 tablets (60 mg total) by mouth daily with breakfast. And decrease by one tablet daily   Spiriva  Respimat 2.5 MCG/ACT Aers Generic drug: Tiotropium Bromide  Inhale 2 puffs into the lungs daily.   torsemide  20 MG tablet Commonly known as: DEMADEX  Take 2 tablets (40 mg total) by mouth 2 (two) times daily.        Allergies: Allergies[1]  Family History: Family History  Problem Relation Age of Onset   Heart disease Father    COPD Father    Heart disease Paternal Grandfather     Social History:  reports that he quit smoking about 17 years ago. His smoking use included cigarettes. He started smoking about 56 years ago. He has a 12 pack-year smoking history. His smokeless tobacco use includes snuff. He reports that he does not currently use alcohol after a past usage of about 42.0 standard drinks of alcohol per week. He reports that he does not use drugs.  ROS: All other review of systems were reviewed and are negative except what is noted above in HPI  Physical Exam: BP 137/73   Pulse (!) 56   Constitutional:  Alert and oriented, No acute distress. HEENT: [place] AT, moist mucus membranes.  Trachea midline, no masses. Cardiovascular: No clubbing, cyanosis, or edema. Respiratory: Normal respiratory effort, no increased work of breathing. GI: Abdomen is soft, nontender, nondistended, no abdominal masses GU: No CVA tenderness.  Lymph: No cervical or inguinal lymphadenopathy. Skin: No rashes, bruises or suspicious lesions. Neurologic: Grossly intact, no focal deficits Psychiatric: Normal mood and affect.  Laboratory Data: Lab Results  Component Value Date   WBC 8.3 11/22/2024   HGB 11.2 (L) 11/22/2024   HCT 35.4 (L) 11/22/2024   MCV 89.4 11/22/2024   PLT 245 11/22/2024    Lab Results  Component Value Date    CREATININE 0.69 11/22/2024    No results found for: PSA  No results found for: TESTOSTERONE  Lab Results  Component Value Date   HGBA1C 5.9 (H) 03/29/2024    Urinalysis    Component Value Date/Time   COLORURINE STRAW (A) 11/22/2024 1313   APPEARANCEUR CLEAR 11/22/2024 1313   LABSPEC 1.004 (L) 11/22/2024 1313   PHURINE 8.0 11/22/2024 1313   GLUCOSEU NEGATIVE 11/22/2024 1313   HGBUR NEGATIVE 11/22/2024 1313   BILIRUBINUR NEGATIVE 11/22/2024 1313   KETONESUR NEGATIVE 11/22/2024 1313   PROTEINUR NEGATIVE 11/22/2024 1313   UROBILINOGEN 1.0 06/13/2015 2104   NITRITE NEGATIVE 11/22/2024 1313   LEUKOCYTESUR NEGATIVE 11/22/2024 1313    Lab Results  Component Value Date   BACTERIA RARE (A) 04/04/2021    Pertinent Imaging:  Results for orders placed during the hospital encounter of 11/22/24  DG Abdomen 1 View  Narrative EXAM: 1 VIEW  XRAY OF THE ABDOMEN 11/22/2024 01:15:00 PM  COMPARISON: None available.  CLINICAL HISTORY: Urinary retention and constipation.  FINDINGS:  LINES, TUBES AND DEVICES: Left upper chest pacemaker in place.  BOWEL: Nonobstructive bowel gas pattern. Colonic stool burden within normal limits.  SOFT TISSUES: Atheromatous vascular calcification of the aortic arch. Body habitus reduces diagnostic sensitivity and specificity.  BONES: Cardiomegaly. Thoracic and lumbar spondylosis. Post degenerative hip arthropathy bilaterally. No acute fracture.  IMPRESSION: 1. Colonic stool burden within normal limits. 2. Thoracic spondylosis. 3. Lumbar spondylosis. 4. Bilateral degenerative hip arthropathy. 5. Cardiomegaly. 6. Atheromatous calcification of the aortic arch. 7. Left upper chest pacemaker in place.  Electronically signed by: Ryan Salvage MD 11/22/2024 01:55 PM EST RP Workstation: [id number]  No results found for this or any previous visit.  No results found for this or any previous visit.  No results found for this or  any previous visit.  No results found for this or any previous visit.  No results found for this or any previous visit.  No results found for this or any previous visit.  Results for orders placed during the hospital encounter of 05/21/19  CT Renal Stone Study  Narrative CLINICAL DATA:  Flank pain  EXAM: CT ABDOMEN AND PELVIS WITHOUT CONTRAST  TECHNIQUE: Multidetector CT imaging of the abdomen and pelvis was performed following the standard protocol without IV contrast.  COMPARISON:  06/29/2015  FINDINGS: LOWER CHEST: There is mild lingular atelectasis.  HEPATOBILIARY: The hepatic contours and density are normal. There is no intra- or extrahepatic biliary dilatation. The gallbladder is normal.  PANCREAS: The pancreatic parenchymal contours are normal and there is no ductal dilatation. There is no peripancreatic fluid collection.  SPLEEN: Normal.  ADRENALS/URINARY TRACT:  --Adrenal glands: Normal.  --Right kidney/ureter: Single nonobstructing renal calculus measuring 2 mm. No hydronephrosis, perinephric stranding or solid renal mass.  --Left kidney/ureter: No hydronephrosis, nephroureterolithiasis, perinephric stranding or solid renal mass.  --Urinary bladder: Normal for degree of distention  STOMACH/BOWEL:  --Stomach/Duodenum: There is no hiatal hernia or other gastric abnormality. The duodenal course and caliber are normal.  --Small bowel: No dilatation or inflammation.  --Colon: No focal abnormality.  --Appendix: Surgically absent.  VASCULAR/LYMPHATIC: There is aortic atherosclerosis without hemodynamically significant stenosis. No abdominal or pelvic lymphadenopathy.  REPRODUCTIVE: Normal prostate size with symmetric seminal vesicles.  MUSCULOSKELETAL. Multilevel degenerative disc disease and facet arthrosis. No bony spinal canal stenosis.  OTHER: Fat containing ventral abdominal hernia.  IMPRESSION: 1. No acute abnormality of the abdomen or  pelvis. 2. 2 mm nonobstructive right nephrolithiasis. 3.  Aortic atherosclerosis (ICD10-I70.0). 4. Fat containing ventral abdominal hernia, unchanged.   Electronically Signed By: Franky Stanford M.D. On: 05/21/2019 20:38   Assessment & Plan:    1. 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 Urinary retention Flomax  0.4mg  daily. Patient was unable to urinate in our office today and did not want the foley replaced. I will have him come back Friday morning with a PVR.    No follow-ups on file.  Belvie Clara, MD  [facility]        [1] No Known Allergies  "

## 2024-11-26 NOTE — Patient Instructions (Signed)
 Trouble Peeing (Acute Urinary Retention) in Males: What to Know Acute urinary retention is when a person can't pee at all or can only pee a little. This can come on all of a sudden. If it's not treated, it can lead to kidney problems or other serious problems. What are the causes? Acute urinary retention may be caused by: A problem with the urethra. This is the tube that drains pee from the bladder. Problems with the nerves in the bladder. Tumors. Some medicines. An infection. Having trouble pooping (constipation). What increases the risk? Older males are more at risk because their prostate gland may get larger as they age. Other health problems can also raise the risk. These include: Multiple sclerosis. Injury to the spinal cord. Diabetes. A condition that affects the way the brain works, such as dementia. Mental health problems. Having urinary retention before. What are the signs or symptoms? Trouble peeing. Pain in the lower belly. How is this treated? Treatment may include: Medicines. Treatment for problems that may cause this. Placing a soft tube called a catheter into the bladder to drain pee out of the body. Therapy to treat mental health problems. If needed, you may be treated in the hospital for kidney problems. Follow these instructions at home: Medicines Take medicines only as told. Do not take any medicine unless your doctor says it's OK. If you were given antibiotics, take them as told. Do not stop taking them even if you start to feel better. General instructions Do not smoke, vape, or use nicotine or tobacco. Drink more fluids as told. If you need to use a catheter, follow the instructions closely. This will help prevent a bladder infection. If told, keep track of changes in your blood pressure at home. Tell your doctor about them. Contact a doctor if: You have bladder cramping, called spasms. You leak pee when you have spasms. You have a fever or chills. You  have blood in your pee. Get help right away if: You can't pee. This information is not intended to replace advice given to you by your health care provider. Make sure you discuss any questions you have with your health care provider. Document Revised: 06/14/2023 Document Reviewed: 06/14/2023 Elsevier Patient Education  2024 [employer].

## 2024-11-28 ENCOUNTER — Emergency Department (HOSPITAL_COMMUNITY)

## 2024-11-28 ENCOUNTER — Ambulatory Visit

## 2024-11-28 ENCOUNTER — Emergency Department (HOSPITAL_COMMUNITY)
Admission: EM | Admit: 2024-11-28 | Discharge: 2024-11-28 | Disposition: A | Discharge status: Home / Self Care | Attending: Emergency Medicine | Admitting: Emergency Medicine

## 2024-11-28 DIAGNOSIS — Z7982 Long term (current) use of aspirin: Secondary | ICD-10-CM | POA: Insufficient documentation

## 2024-11-28 DIAGNOSIS — J168 Pneumonia due to other specified infectious organisms: Secondary | ICD-10-CM | POA: Diagnosis not present

## 2024-11-28 DIAGNOSIS — Z5329 Procedure and treatment not carried out because of patient's decision for other reasons: Secondary | ICD-10-CM | POA: Diagnosis not present

## 2024-11-28 DIAGNOSIS — R339 Retention of urine, unspecified: Secondary | ICD-10-CM | POA: Insufficient documentation

## 2024-11-28 DIAGNOSIS — Z89511 Acquired absence of right leg below knee: Secondary | ICD-10-CM | POA: Insufficient documentation

## 2024-11-28 DIAGNOSIS — J189 Pneumonia, unspecified organism: Secondary | ICD-10-CM

## 2024-11-28 DIAGNOSIS — R600 Localized edema: Secondary | ICD-10-CM | POA: Insufficient documentation

## 2024-11-28 DIAGNOSIS — J449 Chronic obstructive pulmonary disease, unspecified: Secondary | ICD-10-CM | POA: Insufficient documentation

## 2024-11-28 DIAGNOSIS — Z9981 Dependence on supplemental oxygen: Secondary | ICD-10-CM | POA: Insufficient documentation

## 2024-11-28 LAB — COMPREHENSIVE METABOLIC PANEL WITH GFR
ALT: 9 U/L (ref 0–44)
AST: 16 U/L (ref 15–41)
Albumin: 3.6 g/dL (ref 3.5–5.0)
Alkaline Phosphatase: 139 U/L — ABNORMAL HIGH (ref 38–126)
Anion gap: 12 (ref 5–15)
BUN: 10 mg/dL (ref 8–23)
CO2: 36 mmol/L — ABNORMAL HIGH (ref 22–32)
Calcium: 9.1 mg/dL (ref 8.9–10.3)
Chloride: 83 mmol/L — ABNORMAL LOW (ref 98–111)
Creatinine, Ser: 0.74 mg/dL (ref 0.61–1.24)
GFR, Estimated: >60 mL/min
Glucose, Bld: 120 mg/dL — ABNORMAL HIGH (ref 70–99)
Potassium: 3.6 mmol/L (ref 3.5–5.1)
Sodium: 130 mmol/L — ABNORMAL LOW (ref 135–145)
Total Bilirubin: 1.1 mg/dL (ref 0.0–1.2)
Total Protein: 6.7 g/dL (ref 6.5–8.1)

## 2024-11-28 LAB — CBC WITH DIFFERENTIAL/PLATELET
Abs Immature Granulocytes: 0.09 10*3/uL — ABNORMAL HIGH (ref 0.00–0.07)
Basophils Absolute: 0 10*3/uL (ref 0.0–0.1)
Basophils Relative: 0 %
Eosinophils Absolute: 0.1 10*3/uL (ref 0.0–0.5)
Eosinophils Relative: 0 %
HCT: 32.9 % — ABNORMAL LOW (ref 39.0–52.0)
Hemoglobin: 10.6 g/dL — ABNORMAL LOW (ref 13.0–17.0)
Immature Granulocytes: 1 %
Lymphocytes Relative: 9 %
Lymphs Abs: 1.6 10*3/uL (ref 0.7–4.0)
MCH: 28.5 pg (ref 26.0–34.0)
MCHC: 32.2 g/dL (ref 30.0–36.0)
MCV: 88.4 fL (ref 80.0–100.0)
Monocytes Absolute: 1.5 10*3/uL — ABNORMAL HIGH (ref 0.1–1.0)
Monocytes Relative: 9 %
Neutro Abs: 14.3 10*3/uL — ABNORMAL HIGH (ref 1.7–7.7)
Neutrophils Relative %: 81 %
Platelets: 233 10*3/uL (ref 150–400)
RBC: 3.72 MIL/uL — ABNORMAL LOW (ref 4.22–5.81)
RDW: 13.1 % (ref 11.5–15.5)
WBC: 17.6 10*3/uL — ABNORMAL HIGH (ref 4.0–10.5)
nRBC: 0 % (ref 0.0–0.2)

## 2024-11-28 LAB — URINALYSIS, ROUTINE W REFLEX MICROSCOPIC
Bilirubin Urine: NEGATIVE
Glucose, UA: NEGATIVE mg/dL
Hgb urine dipstick: NEGATIVE
Ketones, ur: NEGATIVE mg/dL
Leukocytes,Ua: NEGATIVE
Nitrite: NEGATIVE
Protein, ur: NEGATIVE mg/dL
Specific Gravity, Urine: 1.006 (ref 1.005–1.030)
pH: 8 (ref 5.0–8.0)

## 2024-11-28 MED ORDER — AZITHROMYCIN 250 MG PO TABS: 250.0000 mg | ORAL_TABLET | Freq: Every day | ORAL | 0 refills | Status: AC

## 2024-11-28 MED ORDER — AMOXICILLIN-POT CLAVULANATE 875-125 MG PO TABS
1.0000 | ORAL_TABLET | Freq: Once | ORAL | Status: AC
  Administered 2024-11-28: 1 via ORAL
  Filled 2024-11-28: qty 1

## 2024-11-28 MED ORDER — AZITHROMYCIN 250 MG PO TABS
500.0000 mg | ORAL_TABLET | Freq: Once | ORAL | Status: AC
  Administered 2024-11-28: 500 mg via ORAL
  Filled 2024-11-28: qty 2

## 2024-11-28 MED ORDER — AMOXICILLIN-POT CLAVULANATE 875-125 MG PO TABS: 1.0000 | ORAL_TABLET | Freq: Two times a day (BID) | ORAL | 0 refills | Status: AC

## 2024-11-28 NOTE — ED Provider Notes (Signed)
 " [facility] EMERGENCY DEPARTMENT AT [facility] Provider Note   CSN: [id number] Arrival date & time: 11/28/24  1151     Patient presents with: Urinary Retention   Nathan Miller is a 74 y.o. male.  74 year old male presents emergency department with complaints of urinary retention.  Patient was seen in the hospital 6 days ago for the same and was given a Foley catheter and then followed up with urology 2 days ago.  At that time he had his Foley catheter removed due to irritation and refused a new Foley catheter against urology recommendation.  Patient was given Flomax  at this time.  Patient reports over the last couple days he has had some burning sensation with urination and decreased urinary output.  Patient also reports some suprapubic tenderness.  Patient denies any fevers, nausea, vomiting, dizziness, chest pain, shortness of breath.  Patient is on 2 L of oxygen  at home continuously due to COPD.      Prior to Admission medications  Medication Sig Start Date End Date Taking? Authorizing Provider  amoxicillin -clavulanate (AUGMENTIN ) 875-125 MG tablet Take 1 tablet by mouth every 12 (twelve) hours. 11/28/24  Yes Myriam Fonda RAMAN, PA-C  azithromycin  (ZITHROMAX ) 250 MG tablet Take 1 tablet (250 mg total) by mouth daily. 11/28/24  Yes Myriam Fonda RAMAN, PA-C  albuterol  (VENTOLIN  HFA) 108 (90 Base) MCG/ACT inhaler Inhale 2 puffs into the lungs every 6 (six) hours as needed for wheezing or shortness of breath. 07/12/21   [provider]  ALPRAZolam (XANAX) 0.5 MG tablet Take 0.5 mg by mouth daily as needed for anxiety. 03/06/24   [provider]  aspirin  EC 81 MG tablet Take 1 tablet (81 mg total) by mouth daily. 01/25/15   Armanda Standing, MD  atorvastatin  (LIPITOR ) 80 MG tablet Take 1 tablet (80 mg total) by mouth daily. 05/04/17   Allred, Lynwood, MD  ferrous sulfate  325 (65 FE) MG tablet Take 325 mg by mouth every other day.    [provider]  metFORMIN  (GLUCOPHAGE )  500 MG tablet Take 1 tablet (500 mg total) by mouth 2 (two) times daily with a meal. 01/25/15   Short, Standing, MD  Multiple Vitamins-Minerals (CENTRUM ADULTS) TABS Take 1 tablet by mouth daily.    [provider]  nitroGLYCERIN  (NITROSTAT ) 0.4 MG SL tablet Place 1 tablet (0.4 mg total) under the tongue every 5 (five) minutes as needed for chest pain. 05/04/23   Lavona Lynwood, MD  potassium chloride  SA (KLOR-CON  M) 20 MEQ tablet Take 1 tablet (20 mEq total) by mouth daily. 04/01/24   Evonnie Lenis, MD  predniSONE  (DELTASONE ) 10 MG tablet Take 6 tablets (60 mg total) by mouth daily with breakfast. And decrease by one tablet daily Patient not taking: Reported on 11/26/2024 04/01/24   Evonnie Lenis, MD  SPIRIVA  RESPIMAT 2.5 MCG/ACT AERS Inhale 2 puffs into the lungs daily. 07/27/21   [provider]  tamsulosin  (FLOMAX ) 0.4 MG CAPS capsule Take 1 capsule (0.4 mg total) by mouth daily after supper. 11/26/24   McKenzie, Belvie CROME, MD  torsemide  (DEMADEX ) 20 MG tablet Take 2 tablets (40 mg total) by mouth 2 (two) times daily. 04/01/24   Evonnie Lenis, MD    Allergies: Patient has no known allergies.    Review of Systems  Genitourinary:  Positive for difficulty urinating and dysuria.  All other systems reviewed and are negative.   Updated Vital Signs BP (!) 115/47 (BP Location: Right Arm)   Pulse (!) 50  Temp 98.6 F (37 C) (Oral)   Resp 18   Ht 5' 7 (1.702 m)   Wt 111.1 kg   SpO2 96%   BMI 38.37 kg/m   Physical Exam Vitals and nursing note reviewed.  Constitutional:      General: He is not in acute distress.    Appearance: Normal appearance. He is not ill-appearing or toxic-appearing.  HENT:     Head: Normocephalic and atraumatic.     Nose: Nose normal.  Eyes:     Extraocular Movements: Extraocular movements intact.     Conjunctiva/sclera: Conjunctivae normal.     Pupils: Pupils are equal, round, and reactive to light.  Cardiovascular:     Rate and Rhythm: Normal rate.   Pulmonary:     Effort: Pulmonary effort is normal. No respiratory distress.     Breath sounds: Normal breath sounds.  Abdominal:     Tenderness: There is no abdominal tenderness. There is no guarding.     Comments: Patient has some mild suprapubic discomfort to palpation patient denies pain.  Musculoskeletal:        General: Normal range of motion.     Cervical back: Normal range of motion.     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  Skin:    General: Skin is warm.     Capillary Refill: Capillary refill takes less than 2 seconds.  Neurological:     General: No focal deficit present.     Mental Status: He is alert.  Psychiatric:        Mood and Affect: Mood normal.        Behavior: Behavior normal.     (all labs ordered are listed, but only abnormal results are displayed) Labs Reviewed  COMPREHENSIVE METABOLIC PANEL WITH GFR - Abnormal; Notable for the following components:      Result Value   Sodium 130 (*)    Chloride 83 (*)    CO2 36 (*)    Glucose, Bld 120 (*)    Alkaline Phosphatase 139 (*)    All other components within normal limits  CBC WITH DIFFERENTIAL/PLATELET - Abnormal; Notable for the following components:   WBC 17.6 (*)    RBC 3.72 (*)    Hemoglobin 10.6 (*)    HCT 32.9 (*)    Neutro Abs 14.3 (*)    Monocytes Absolute 1.5 (*)    Abs Immature Granulocytes 0.09 (*)    All other components within normal limits  URINALYSIS, ROUTINE W REFLEX MICROSCOPIC    EKG: None  Radiology: DG Chest 2 View Result Date: 11/28/2024 CLINICAL DATA:  Cough EXAM: DG CHEST 2V COMPARISON:  Chest radiograph dated 03/28/2024 FINDINGS: Lines/tubes: Left chest wall pacemaker lead projects over the right ventricle. Lungs: Well inflated lungs. Patchy bilateral lower lung opacities. Pleura: No pneumothorax or pleural effusion. Heart/mediastinum: Similar enlarged cardiomediastinal silhouette.  Bones: No acute osseous abnormality. IMPRESSION: Patchy bilateral lower lung opacities, which may represent atelectasis, aspiration, or pneumonia. Electronically Signed   By: Limin  Xu M.D.   On: 11/28/2024 16:58     Procedures   Medications Ordered in the ED  amoxicillin -clavulanate (AUGMENTIN ) 875-125 MG per tablet 1 tablet (1 tablet Oral Given 11/28/24 1723)  azithromycin  (ZITHROMAX ) tablet 500 mg (500 mg Oral Given 11/28/24 1724)    74 y.o. male presents to the ED with complaints of dysuria and decreased urine output., The differential diagnosis includes urinary  outlet obstruction, UTI, pyelonephritis, sepsis (Ddx)  On arrival pt is nontoxic, vitals . Exam significant for mild suprapubic discomfort to palpation  Additional history obtained from chart review 70 for patient being seen on 28 January for benign prostatic hyperplasia with urinary obstruction, patient was started on Flomax  at this time and refused Foley in office.  Patient was advised to come back to urology on Friday (today).   I ordered medication azithromycin  and Augmentin  for pneumonia  Lab Tests:  CMP CBC UA CBC concerning for leukocytosis of 17.6.  Hemoglobin at 10.6, hemoglobin is near patient's baseline.  CMP remarkable for hyponatremia 130.   Imaging Studies ordered:  I ordered imaging studies which included chest x-ray concerning for patchy bilateral lower lung opacities concerning for pneumonia, aspiration, atelectasis  ED Course:   Patient is requesting Foley catheter insertion and discharge.  Patient denies any other symptoms.  Basic lab work and UA will be ordered to evaluate for UTI with dysuria.  CBC was concerning for leukocytosis at 17.6.  Pending UA for evaluation of UTI.  Patient is SIRS negative.  Patient is anxious to leave, patient was advised pending UA for concern of UTI.  He agreed to stay at this time.  UA was unremarkable for any concerns of infection.  With white count elevated will get  chest x-ray to evaluate for any underlying pneumonia.  On reevaluation patient reports he has no complaints and does not have any recent symptoms present recently.  Chest x-ray was concerning for patchy bilateral lower lung opacities which represent atelectasis aspiration or pneumonia.  Patient is 7 with bilateral lobar pneumonia with white count of 17.  Admission for IV antibiotics were discussed with patient and he refused.  It was advised to patient that this is AGAINST MEDICAL ADVICE and he reports he is not going to be admitted to the hospital.  Patient is requesting oral antibiotics.  It was advised the patient I will will prescribe the oral antibiotics but its AGAINST MEDICAL ADVICE and strict return precautions were discussed with patient.  Patient signed out AMA at this time.  Portions of this note were generated with Scientist, clinical (histocompatibility and immunogenetics). Dictation errors may occur despite best attempts at proofreading.   Final diagnoses:  Urinary retention  Pneumonia of both lower lobes due to infectious organism    ED Discharge Orders          Ordered    amoxicillin -clavulanate (AUGMENTIN ) 875-125 MG tablet  Every 12 hours        11/28/24 1716    azithromycin  (ZITHROMAX ) 250 MG tablet  Daily        11/28/24 1716               Myriam Fonda RAMAN, [place] 11/28/24 2331  "

## 2024-11-28 NOTE — ED Triage Notes (Signed)
 Pt  comes by EMS for UTI s/s.  Foley cath was placed on 1/24  Foley cath was taken out on Wednesday. Ever since pt has experienced burning and decreased urinary output. A&Ox4.   Pt is on 2L [place] at baseline.

## 2024-11-28 NOTE — ED Notes (Signed)
 Urine requested. Urinal at bedside

## 2024-11-28 NOTE — Discharge Instructions (Addendum)
 Your chest x-ray was concerning for pneumonia.  We recommend inpatient treatment and admission to the hospital but as discussed you have decided to go AGAINST MEDICAL ADVICE and go home.  I will write you a prescription for antibiotics please take them as prescribed.  The Foley catheter we have placed today it is recommended that you follow-up with urology this week for reassessment.  If you experience any concerning new or worsening symptoms please return to the emergency department for further evaluation.

## 2024-12-01 NOTE — Progress Notes (Signed)
 Transitional Care Management Outreach following ED evaluation   Chief complaint: Urinary retention, pneumonia Date of service:  1/30 [facility]  Symptoms and condition: How are your symptoms compared to when you were in the hospital? improved Are you experiencing any new or worsening symptoms? No Are you able to manage your pain at home?   Not applicable   Medication management: Did you receive all new medications? Yes Do you understand how to take your medications, including the changes made during your ED visit? Yes Do you have any concerns about your medications?  No  Follow-up appointments: When is your next scheduled appointment with your doctor/specialist? 2/18 pcp, 3/4 [facility], 5/6 [facility]. Pt will call urology to schedule f/u this week. Do you have any concerns about getting to your appointment? No  Referrals made to other disciplines: None   Emergency contact information: Do you know who to contact if you have any questions or concerns after discharge?  Yes  Denies any further needs/concerns at this time. Patient understands to contact PCP office with any new or worsening signs/symptoms.  Did patient attempt to contact PCP office prior to ED visit? No, went to ED due to inclement weather  Ellouise Quale, RN Ambulatory Care Navigator [phone]

## 2024-12-03 ENCOUNTER — Other Ambulatory Visit: Payer: Self-pay

## 2024-12-03 ENCOUNTER — Emergency Department (HOSPITAL_COMMUNITY)
Admission: EM | Admit: 2024-12-03 | Discharge: 2024-12-03 | Disposition: A | Discharge status: Home / Self Care | Source: Home / Self Care | Attending: Emergency Medicine | Admitting: Emergency Medicine

## 2024-12-03 ENCOUNTER — Emergency Department (HOSPITAL_COMMUNITY)

## 2024-12-03 ENCOUNTER — Encounter (HOSPITAL_COMMUNITY): Payer: Self-pay

## 2024-12-03 DIAGNOSIS — N3001 Acute cystitis with hematuria: Secondary | ICD-10-CM

## 2024-12-03 LAB — CBC WITH DIFFERENTIAL/PLATELET
Abs Immature Granulocytes: 0.06 10*3/uL (ref 0.00–0.07)
Basophils Absolute: 0 10*3/uL (ref 0.0–0.1)
Basophils Relative: 0 %
Eosinophils Absolute: 0.4 10*3/uL (ref 0.0–0.5)
Eosinophils Relative: 4 %
HCT: 32.9 % — ABNORMAL LOW (ref 39.0–52.0)
Hemoglobin: 10.2 g/dL — ABNORMAL LOW (ref 13.0–17.0)
Immature Granulocytes: 1 %
Lymphocytes Relative: 13 %
Lymphs Abs: 1.2 10*3/uL (ref 0.7–4.0)
MCH: 28.4 pg (ref 26.0–34.0)
MCHC: 31 g/dL (ref 30.0–36.0)
MCV: 91.6 fL (ref 80.0–100.0)
Monocytes Absolute: 1 10*3/uL (ref 0.1–1.0)
Monocytes Relative: 11 %
Neutro Abs: 6.5 10*3/uL (ref 1.7–7.7)
Neutrophils Relative %: 71 %
Platelets: 279 10*3/uL (ref 150–400)
RBC: 3.59 MIL/uL — ABNORMAL LOW (ref 4.22–5.81)
RDW: 13.2 % (ref 11.5–15.5)
WBC: 9.2 10*3/uL (ref 4.0–10.5)
nRBC: 0 % (ref 0.0–0.2)

## 2024-12-03 LAB — URINALYSIS, ROUTINE W REFLEX MICROSCOPIC
Bilirubin Urine: NEGATIVE
Glucose, UA: NEGATIVE mg/dL
Ketones, ur: NEGATIVE mg/dL
Nitrite: NEGATIVE
Protein, ur: 30 mg/dL — AB
Specific Gravity, Urine: 1.012 (ref 1.005–1.030)
pH: 5 (ref 5.0–8.0)

## 2024-12-03 LAB — COMPREHENSIVE METABOLIC PANEL WITH GFR
ALT: 15 U/L (ref 0–44)
AST: 19 U/L (ref 15–41)
Albumin: 3.3 g/dL — ABNORMAL LOW (ref 3.5–5.0)
Alkaline Phosphatase: 131 U/L — ABNORMAL HIGH (ref 38–126)
Anion gap: 6 (ref 5–15)
BUN: 11 mg/dL (ref 8–23)
CO2: 42 mmol/L — ABNORMAL HIGH (ref 22–32)
Calcium: 9.2 mg/dL (ref 8.9–10.3)
Chloride: 89 mmol/L — ABNORMAL LOW (ref 98–111)
Creatinine, Ser: 0.79 mg/dL (ref 0.61–1.24)
GFR, Estimated: >60 mL/min
Glucose, Bld: 118 mg/dL — ABNORMAL HIGH (ref 70–99)
Potassium: 4.4 mmol/L (ref 3.5–5.1)
Sodium: 137 mmol/L (ref 135–145)
Total Bilirubin: 0.5 mg/dL (ref 0.0–1.2)
Total Protein: 6.5 g/dL (ref 6.5–8.1)

## 2024-12-03 MED ORDER — IPRATROPIUM-ALBUTEROL 0.5-2.5 (3) MG/3ML IN SOLN
3.0000 mL | Freq: Once | RESPIRATORY_TRACT | Status: AC
  Administered 2024-12-03: 3 mL via RESPIRATORY_TRACT
  Filled 2024-12-03: qty 3

## 2024-12-03 NOTE — ED Triage Notes (Signed)
 Pt via [facility] c/o shortness of breath.Wears 2L chronically and was at 92%. Hx of COPD, CHF. Foley present- pt states some burning sensation is starting and feels like it is not draining like normal.  20 G FRM.

## 2024-12-03 NOTE — ED Notes (Signed)
 Called respiratory for neb tx.

## 2024-12-03 NOTE — ED Notes (Signed)
 Entered in room with pt on [place] but attached to tank on bed, tank had ran out and pt placed on O2 in the room. Pt's sats in the 70's, sats increasing with O2 attached to wall.

## 2024-12-03 NOTE — Discharge Instructions (Signed)
 Continue present antibiotics and follow-up with your urologist next week as planned

## 2024-12-03 NOTE — ED Provider Notes (Signed)
 " [place] EMERGENCY DEPARTMENT AT [facility] Provider Note   CSN: [id number] Arrival date & time: 12/03/24  9344     Patient presents with: No chief complaint on file.   Nathan Miller is a 74 y.o. male.  {Add pertinent medical, surgical, social history, OB history to YEP:67052} Patient has a Foley placed.  Patient complains of burning with the Foley and is on antibiotics for urinary tract infection.  He has taken Augmentin  and also is on Zithromax .  Patient also has COPD and complains of mild shortness of breath.  Patient wants the Foley removed   Dysuria Presenting symptoms: dysuria        Prior to Admission medications  Medication Sig Start Date End Date Taking? Authorizing Provider  albuterol  (VENTOLIN  HFA) 108 (90 Base) MCG/ACT inhaler Inhale 2 puffs into the lungs every 6 (six) hours as needed for wheezing or shortness of breath. 07/12/21   [provider]  ALPRAZolam (XANAX) 0.5 MG tablet Take 0.5 mg by mouth daily as needed for anxiety. 03/06/24   [provider]  amoxicillin -clavulanate (AUGMENTIN ) 875-125 MG tablet Take 1 tablet by mouth every 12 (twelve) hours. 11/28/24   Myriam Fonda RAMAN, PA-C  aspirin  EC 81 MG tablet Take 1 tablet (81 mg total) by mouth daily. 01/25/15   Armanda Standing, MD  atorvastatin  (LIPITOR ) 80 MG tablet Take 1 tablet (80 mg total) by mouth daily. 05/04/17   Allred, Lynwood, MD  azithromycin  (ZITHROMAX ) 250 MG tablet Take 1 tablet (250 mg total) by mouth daily. 11/28/24   Bundy, Joshua S, PA-C  ferrous sulfate  325 (65 FE) MG tablet Take 325 mg by mouth every other day.    [provider]  metFORMIN  (GLUCOPHAGE ) 500 MG tablet Take 1 tablet (500 mg total) by mouth 2 (two) times daily with a meal. 01/25/15   Short, Standing, MD  Multiple Vitamins-Minerals (CENTRUM ADULTS) TABS Take 1 tablet by mouth daily.    [provider]  nitroGLYCERIN  (NITROSTAT ) 0.4 MG SL tablet Place 1 tablet (0.4 mg total) under the tongue  every 5 (five) minutes as needed for chest pain. 05/04/23   Lavona Lynwood, MD  potassium chloride  SA (KLOR-CON  M) 20 MEQ tablet Take 1 tablet (20 mEq total) by mouth daily. 04/01/24   Evonnie Lenis, MD  predniSONE  (DELTASONE ) 10 MG tablet Take 6 tablets (60 mg total) by mouth daily with breakfast. And decrease by one tablet daily Patient not taking: Reported on 11/26/2024 04/01/24   Evonnie Lenis, MD  SPIRIVA  RESPIMAT 2.5 MCG/ACT AERS Inhale 2 puffs into the lungs daily. 07/27/21   [provider]  tamsulosin  (FLOMAX ) 0.4 MG CAPS capsule Take 1 capsule (0.4 mg total) by mouth daily after supper. 11/26/24   McKenzie, Belvie CROME, MD  torsemide  (DEMADEX ) 20 MG tablet Take 2 tablets (40 mg total) by mouth 2 (two) times daily. 04/01/24   Evonnie Lenis, MD    Allergies: Patient has no known allergies.    Review of Systems  Genitourinary:  Positive for dysuria.    Updated Vital Signs BP (!) 127/50   Pulse (!) 45   Temp 98.6 F (37 C) (Oral)   Resp (!) 25   SpO2 96%   Physical Exam  (all labs ordered are listed, but only abnormal results are displayed) Labs Reviewed  CBC WITH DIFFERENTIAL/PLATELET - Abnormal; Notable for the following components:      Result Value   RBC 3.59 (*)    Hemoglobin 10.2 (*)  HCT 32.9 (*)    All other components within normal limits  COMPREHENSIVE METABOLIC PANEL WITH GFR - Abnormal; Notable for the following components:   Chloride 89 (*)    CO2 42 (*)    Glucose, Bld 118 (*)    Albumin 3.3 (*)    Alkaline Phosphatase 131 (*)    All other components within normal limits  URINALYSIS, ROUTINE W REFLEX MICROSCOPIC - Abnormal; Notable for the following components:   Hgb urine dipstick MODERATE (*)    Protein, ur 30 (*)    Leukocytes,Ua MODERATE (*)    Bacteria, UA RARE (*)    All other components within normal limits  URINE CULTURE    EKG: None  Radiology: DG Chest Port 1 View Result Date: 12/03/2024 EXAM: 1 VIEW(S) XRAY OF THE CHEST 12/03/2024 12:17:00 PM  COMPARISON: 11/28/2024 CLINICAL HISTORY: Shortness of breath. FINDINGS: LINES, TUBES AND DEVICES: Left cardiac pacer in place. LUNGS AND PLEURA: Patchy left basilar airspace opacities. Emphysema. Pulmonary emphysema is an independent risk factor for lung cancer; consider enrolling the patient in low-dose CT lung cancer screening. No pleural effusion. No pneumothorax. HEART AND MEDIASTINUM: Aortic atherosclerosis. Left cardiac pacer in place. Borderline cardiomegaly. BONES AND SOFT TISSUES: No acute osseous abnormality. IMPRESSION: 1. Patchy left basilar airspace opacities. 2. Emphysema. pulmonary emphysema is a independent risk factor for lung cancer, consider enrolling the patient in low dose CT lung cancer screening. 3. Borderline cardiomegaly. 4. Aortic atherosclerosis. 5. Left cardiac pacer in place. Electronically signed by: Ryan Salvage MD 12/03/2024 12:39 PM EST RP Workstation: [id number]    {Document cardiac monitor, telemetry assessment procedure when appropriate:32947} Procedures   Medications Ordered in the ED  ipratropium-albuterol  (DUONEB) 0.5-2.5 (3) MG/3ML nebulizer solution 3 mL (3 mLs Nebulization Given 12/03/24 1229)      {Click here for ABCD2, HEART and other calculators REFRESH Note before signing:1}                              Medical Decision Making Amount and/or Complexity of Data Reviewed Labs: ordered. Radiology: ordered.  Risk Prescription drug management.   UTI with irritation from the Foley.  Foley was removed and he will continue his antibiotics.  Patient has mild exacerbation of COPD that improved with neb treatment  {Document critical care time when appropriate  Document review of labs and clinical decision tools ie CHADS2VASC2, etc  Document your independent review of radiology images and any outside records  Document your discussion with family members, caretakers and with consultants  Document social determinants of health affecting pt's care   Document your decision making why or why not admission, treatments were needed:32947:::1}   Final diagnoses:  None    ED Discharge Orders     None        "

## 2024-12-04 ENCOUNTER — Telehealth: Payer: Self-pay | Admitting: Urology

## 2024-12-04 LAB — URINE CULTURE: Culture: NO GROWTH

## 2024-12-04 NOTE — ED Notes (Signed)
 Pt refused to have foley replaced, EDP made aware. Pt understood if not able to void will need to return to have foley replaced. Pt verbalized understanding.

## 2024-12-04 NOTE — Telephone Encounter (Signed)
 Wants appointment with Dr Sherrilee, he had cath put back in and went yesterday and had it removed. He says he is passing urine but as the day goes on he gets a tight feeling.

## 2024-12-05 ENCOUNTER — Ambulatory Visit

## 2024-12-05 NOTE — Telephone Encounter (Signed)
 Returned call to patient to let him know he could come in for a PVR patient was scheduled appointment time confirmed

## 2025-03-04 ENCOUNTER — Ambulatory Visit: Admitting: Cardiology
